# Patient Record
Sex: Female | Born: 1965
Health system: Southern US, Community
[De-identification: ages and names within clinical notes are randomized; demographics above are authoritative.]

## PROBLEM LIST (undated history)

## (undated) DIAGNOSIS — IMO0002 Reserved for concepts with insufficient information to code with codable children: Secondary | ICD-10-CM

## (undated) DIAGNOSIS — I429 Cardiomyopathy, unspecified: Secondary | ICD-10-CM

## (undated) DIAGNOSIS — M1990 Unspecified osteoarthritis, unspecified site: Secondary | ICD-10-CM

## (undated) DIAGNOSIS — I447 Left bundle-branch block, unspecified: Secondary | ICD-10-CM

## (undated) DIAGNOSIS — N920 Excessive and frequent menstruation with regular cycle: Secondary | ICD-10-CM

## (undated) DIAGNOSIS — R1900 Intra-abdominal and pelvic swelling, mass and lump, unspecified site: Secondary | ICD-10-CM

## (undated) DIAGNOSIS — G5600 Carpal tunnel syndrome, unspecified upper limb: Secondary | ICD-10-CM

## (undated) DIAGNOSIS — E785 Hyperlipidemia, unspecified: Secondary | ICD-10-CM

## (undated) DIAGNOSIS — M25512 Pain in left shoulder: Secondary | ICD-10-CM

## (undated) DIAGNOSIS — A609 Anogenital herpesviral infection, unspecified: Secondary | ICD-10-CM

## (undated) DIAGNOSIS — K8020 Calculus of gallbladder without cholecystitis without obstruction: Secondary | ICD-10-CM

## (undated) DIAGNOSIS — R079 Chest pain, unspecified: Secondary | ICD-10-CM

## (undated) DIAGNOSIS — I10 Essential (primary) hypertension: Secondary | ICD-10-CM

## (undated) DIAGNOSIS — E1165 Type 2 diabetes mellitus with hyperglycemia: Secondary | ICD-10-CM

## (undated) HISTORY — DX: Pain in left shoulder: M25.512

## (undated) HISTORY — DX: Carpal tunnel syndrome, unspecified upper limb: G56.00

## (undated) HISTORY — DX: Intra-abdominal and pelvic swelling, mass and lump, unspecified site: R19.00

## (undated) HISTORY — DX: Anogenital herpesviral infection, unspecified: A60.9

## (undated) HISTORY — DX: Reserved for concepts with insufficient information to code with codable children: IMO0002

## (undated) HISTORY — DX: Hyperlipidemia, unspecified: E78.5

## (undated) HISTORY — DX: Type 2 diabetes mellitus with hyperglycemia: E11.65

## (undated) HISTORY — DX: Essential (primary) hypertension: I10

## (undated) HISTORY — DX: Excessive and frequent menstruation with regular cycle: N92.0

## (undated) HISTORY — PX: TUBAL LIGATION: SHX77

---
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
## 1997-09-20 ENCOUNTER — Emergency Department (HOSPITAL_COMMUNITY): Admission: EM | Admit: 1997-09-20 | Discharge: 1997-09-20 | Payer: Self-pay | Admitting: Emergency Medicine

## 1998-07-28 ENCOUNTER — Emergency Department (HOSPITAL_COMMUNITY): Admission: EM | Admit: 1998-07-28 | Discharge: 1998-07-28 | Payer: Self-pay | Admitting: Emergency Medicine

## 1998-07-28 ENCOUNTER — Encounter: Payer: Self-pay | Admitting: Emergency Medicine

## 1998-08-10 ENCOUNTER — Emergency Department (HOSPITAL_COMMUNITY): Admission: EM | Admit: 1998-08-10 | Discharge: 1998-08-10 | Payer: Self-pay | Admitting: Emergency Medicine

## 1999-05-12 ENCOUNTER — Emergency Department (HOSPITAL_COMMUNITY): Admission: EM | Admit: 1999-05-12 | Discharge: 1999-05-12 | Payer: Self-pay | Admitting: Emergency Medicine

## 1999-08-25 ENCOUNTER — Emergency Department (HOSPITAL_COMMUNITY): Admission: EM | Admit: 1999-08-25 | Discharge: 1999-08-25 | Payer: Self-pay | Admitting: Emergency Medicine

## 1999-10-06 ENCOUNTER — Encounter: Admission: RE | Admit: 1999-10-06 | Discharge: 1999-10-06 | Payer: Self-pay | Admitting: Internal Medicine

## 1999-10-23 ENCOUNTER — Encounter: Admission: RE | Admit: 1999-10-23 | Discharge: 2000-01-21 | Payer: Self-pay | Admitting: Internal Medicine

## 1999-10-30 ENCOUNTER — Encounter: Admission: RE | Admit: 1999-10-30 | Discharge: 1999-10-30 | Payer: Self-pay | Admitting: Internal Medicine

## 1999-11-27 ENCOUNTER — Ambulatory Visit (HOSPITAL_COMMUNITY): Admission: RE | Admit: 1999-11-27 | Discharge: 1999-11-27 | Payer: Self-pay | Admitting: Internal Medicine

## 1999-11-27 ENCOUNTER — Encounter: Payer: Self-pay | Admitting: Internal Medicine

## 1999-11-27 ENCOUNTER — Encounter: Admission: RE | Admit: 1999-11-27 | Discharge: 1999-11-27 | Payer: Self-pay | Admitting: Internal Medicine

## 1999-12-04 ENCOUNTER — Encounter: Admission: RE | Admit: 1999-12-04 | Discharge: 1999-12-04 | Payer: Self-pay | Admitting: Hematology and Oncology

## 1999-12-11 ENCOUNTER — Encounter: Admission: RE | Admit: 1999-12-11 | Discharge: 1999-12-11 | Payer: Self-pay | Admitting: Internal Medicine

## 2000-01-22 ENCOUNTER — Encounter: Admission: RE | Admit: 2000-01-22 | Discharge: 2000-01-22 | Payer: Self-pay | Admitting: Internal Medicine

## 2000-02-19 ENCOUNTER — Encounter: Admission: RE | Admit: 2000-02-19 | Discharge: 2000-02-19 | Payer: Self-pay | Admitting: Internal Medicine

## 2000-03-20 ENCOUNTER — Encounter: Admission: RE | Admit: 2000-03-20 | Discharge: 2000-03-20 | Payer: Self-pay | Admitting: Internal Medicine

## 2000-05-13 ENCOUNTER — Encounter: Admission: RE | Admit: 2000-05-13 | Discharge: 2000-05-13 | Payer: Self-pay | Admitting: Internal Medicine

## 2000-07-21 ENCOUNTER — Encounter (INDEPENDENT_AMBULATORY_CARE_PROVIDER_SITE_OTHER): Payer: Self-pay | Admitting: *Deleted

## 2000-09-19 ENCOUNTER — Other Ambulatory Visit: Admission: RE | Admit: 2000-09-19 | Discharge: 2000-09-19 | Payer: Self-pay | Admitting: Obstetrics and Gynecology

## 2000-12-23 ENCOUNTER — Emergency Department (HOSPITAL_COMMUNITY): Admission: EM | Admit: 2000-12-23 | Discharge: 2000-12-23 | Payer: Self-pay

## 2000-12-24 ENCOUNTER — Encounter: Admission: RE | Admit: 2000-12-24 | Discharge: 2000-12-24 | Payer: Self-pay

## 2001-04-30 ENCOUNTER — Emergency Department (HOSPITAL_COMMUNITY): Admission: EM | Admit: 2001-04-30 | Discharge: 2001-04-30 | Payer: Self-pay | Admitting: Emergency Medicine

## 2001-04-30 ENCOUNTER — Encounter: Payer: Self-pay | Admitting: Emergency Medicine

## 2001-06-20 ENCOUNTER — Encounter: Admission: RE | Admit: 2001-06-20 | Discharge: 2001-06-20 | Payer: Self-pay | Admitting: Internal Medicine

## 2002-01-20 ENCOUNTER — Encounter: Admission: RE | Admit: 2002-01-20 | Discharge: 2002-01-20 | Payer: Self-pay | Admitting: Internal Medicine

## 2002-02-03 ENCOUNTER — Encounter: Admission: RE | Admit: 2002-02-03 | Discharge: 2002-02-03 | Payer: Self-pay | Admitting: Internal Medicine

## 2002-02-07 ENCOUNTER — Encounter: Payer: Self-pay | Admitting: Emergency Medicine

## 2002-02-07 ENCOUNTER — Emergency Department (HOSPITAL_COMMUNITY): Admission: EM | Admit: 2002-02-07 | Discharge: 2002-02-07 | Payer: Self-pay

## 2002-03-16 ENCOUNTER — Encounter: Admission: RE | Admit: 2002-03-16 | Discharge: 2002-03-16 | Payer: Self-pay | Admitting: Internal Medicine

## 2002-04-07 ENCOUNTER — Other Ambulatory Visit: Admission: RE | Admit: 2002-04-07 | Discharge: 2002-04-07 | Payer: Self-pay | Admitting: Obstetrics and Gynecology

## 2003-02-10 ENCOUNTER — Encounter: Admission: RE | Admit: 2003-02-10 | Discharge: 2003-02-10 | Payer: Self-pay | Admitting: Internal Medicine

## 2003-04-22 ENCOUNTER — Encounter: Admission: RE | Admit: 2003-04-22 | Discharge: 2003-04-22 | Payer: Self-pay | Admitting: Internal Medicine

## 2003-10-07 ENCOUNTER — Ambulatory Visit: Payer: Self-pay | Admitting: Internal Medicine

## 2003-12-07 ENCOUNTER — Ambulatory Visit: Payer: Self-pay | Admitting: Internal Medicine

## 2004-01-04 ENCOUNTER — Ambulatory Visit: Payer: Self-pay | Admitting: Internal Medicine

## 2004-03-14 ENCOUNTER — Ambulatory Visit: Payer: Self-pay | Admitting: Internal Medicine

## 2004-03-24 ENCOUNTER — Ambulatory Visit: Payer: Self-pay | Admitting: Internal Medicine

## 2004-03-27 ENCOUNTER — Encounter (INDEPENDENT_AMBULATORY_CARE_PROVIDER_SITE_OTHER): Payer: Self-pay | Admitting: *Deleted

## 2004-03-27 LAB — CONVERTED CEMR LAB: Pap Smear: NORMAL

## 2004-05-03 ENCOUNTER — Ambulatory Visit: Payer: Self-pay | Admitting: Internal Medicine

## 2004-06-02 ENCOUNTER — Ambulatory Visit: Payer: Self-pay | Admitting: Internal Medicine

## 2004-06-15 ENCOUNTER — Ambulatory Visit: Payer: Self-pay | Admitting: Internal Medicine

## 2004-07-28 ENCOUNTER — Ambulatory Visit: Payer: Self-pay | Admitting: Internal Medicine

## 2004-10-03 ENCOUNTER — Ambulatory Visit: Payer: Self-pay | Admitting: Internal Medicine

## 2004-10-12 ENCOUNTER — Ambulatory Visit: Payer: Self-pay | Admitting: Internal Medicine

## 2004-10-12 ENCOUNTER — Ambulatory Visit (HOSPITAL_COMMUNITY): Admission: RE | Admit: 2004-10-12 | Discharge: 2004-10-12 | Payer: Self-pay | Admitting: Internal Medicine

## 2005-01-19 ENCOUNTER — Emergency Department (HOSPITAL_COMMUNITY): Admission: EM | Admit: 2005-01-19 | Discharge: 2005-01-19 | Payer: Self-pay | Admitting: Family Medicine

## 2005-05-15 ENCOUNTER — Encounter (INDEPENDENT_AMBULATORY_CARE_PROVIDER_SITE_OTHER): Payer: Self-pay | Admitting: *Deleted

## 2005-05-15 ENCOUNTER — Ambulatory Visit: Payer: Self-pay | Admitting: Internal Medicine

## 2005-05-16 ENCOUNTER — Emergency Department (HOSPITAL_COMMUNITY): Admission: EM | Admit: 2005-05-16 | Discharge: 2005-05-16 | Payer: Self-pay | Admitting: Emergency Medicine

## 2005-06-13 ENCOUNTER — Ambulatory Visit: Payer: Self-pay | Admitting: Internal Medicine

## 2005-07-05 ENCOUNTER — Ambulatory Visit: Payer: Self-pay | Admitting: Internal Medicine

## 2005-11-02 ENCOUNTER — Ambulatory Visit: Payer: Self-pay | Admitting: Internal Medicine

## 2005-11-02 ENCOUNTER — Encounter (INDEPENDENT_AMBULATORY_CARE_PROVIDER_SITE_OTHER): Payer: Self-pay | Admitting: Pulmonary Disease

## 2005-11-02 ENCOUNTER — Ambulatory Visit (HOSPITAL_COMMUNITY): Admission: RE | Admit: 2005-11-02 | Discharge: 2005-11-02 | Payer: Self-pay | Admitting: Internal Medicine

## 2005-11-02 LAB — CONVERTED CEMR LAB
Basophils Absolute: 0 10*3/uL (ref 0.0–0.1)
Basophils percent auto: 0 % (ref 0–1)
Eosinophils Absolute: 0.2 cells/mcL (ref 0.0–0.7)
Eosinophils Relative: 2 % (ref 0–5)
HCT: 34.2 % — ABNORMAL LOW (ref 36.0–46.0)
Hemoglobin: 11.3 g/dL — ABNORMAL LOW (ref 12.0–15.0)
Leukocyte count, blood: 8.6 10*9/L (ref 4.0–10.5)
Lymphocytes Relative: 32 % (ref 12–46)
Lymphs Abs: 2.7 10*3/uL (ref 0.7–3.3)
MCHC: 33 g/dL (ref 30.0–36.0)
MCV: 94.5 fL (ref 78.0–100.0)
Monocytes Absolute: 0.8 10*3/uL — ABNORMAL HIGH (ref 0.2–0.7)
Monocytes Relative: 9 % (ref 3–11)
Neutro Abs: 4.9 10*3/uL (ref 1.7–7.7)
Neutrophils Relative %: 57 % (ref 43–77)
Platelets: 419 10*3/uL — ABNORMAL HIGH (ref 150–400)
RBC: 3.62 M/uL — ABNORMAL LOW (ref 3.87–5.11)
RDW: 12.6 % (ref 11.5–14.0)

## 2005-11-07 ENCOUNTER — Encounter (INDEPENDENT_AMBULATORY_CARE_PROVIDER_SITE_OTHER): Payer: Self-pay | Admitting: *Deleted

## 2005-11-07 DIAGNOSIS — J45909 Unspecified asthma, uncomplicated: Secondary | ICD-10-CM

## 2005-11-07 DIAGNOSIS — B009 Herpesviral infection, unspecified: Secondary | ICD-10-CM

## 2005-11-07 HISTORY — DX: Herpesviral infection, unspecified: B00.9

## 2005-11-07 HISTORY — DX: Unspecified asthma, uncomplicated: J45.909

## 2006-07-08 ENCOUNTER — Ambulatory Visit: Payer: Self-pay | Admitting: Internal Medicine

## 2006-07-08 ENCOUNTER — Telehealth (INDEPENDENT_AMBULATORY_CARE_PROVIDER_SITE_OTHER): Payer: Self-pay | Admitting: *Deleted

## 2006-07-08 ENCOUNTER — Encounter (INDEPENDENT_AMBULATORY_CARE_PROVIDER_SITE_OTHER): Payer: Self-pay | Admitting: Dermatology

## 2006-07-08 DIAGNOSIS — I10 Essential (primary) hypertension: Secondary | ICD-10-CM

## 2006-07-08 HISTORY — DX: Essential (primary) hypertension: I10

## 2006-07-08 LAB — CONVERTED CEMR LAB
BUN: 14 mg/dL (ref 6–23)
Blood Glucose, Fingerstick: 404
CO2: 25 meq/L (ref 19–32)
Calcium: 9.5 mg/dL (ref 8.4–10.5)
Chloride: 101 meq/L (ref 96–112)
Creatinine, Ser: 0.7 mg/dL (ref 0.40–1.20)
Glucose, Bld: 353 mg/dL — ABNORMAL HIGH (ref 70–99)
Hgb A1c MFr Bld: 11.1 %
Potassium: 4.2 meq/L (ref 3.5–5.3)
Sodium: 136 meq/L (ref 135–145)

## 2006-07-11 ENCOUNTER — Encounter (INDEPENDENT_AMBULATORY_CARE_PROVIDER_SITE_OTHER): Payer: Self-pay | Admitting: Dermatology

## 2006-07-11 ENCOUNTER — Ambulatory Visit: Payer: Self-pay | Admitting: Internal Medicine

## 2006-07-11 LAB — CONVERTED CEMR LAB: Blood Glucose, Fingerstick: 231

## 2006-08-06 ENCOUNTER — Emergency Department (HOSPITAL_COMMUNITY): Admission: EM | Admit: 2006-08-06 | Discharge: 2006-08-06 | Payer: Self-pay | Admitting: Emergency Medicine

## 2006-08-19 ENCOUNTER — Telehealth (INDEPENDENT_AMBULATORY_CARE_PROVIDER_SITE_OTHER): Payer: Self-pay | Admitting: *Deleted

## 2006-09-02 ENCOUNTER — Ambulatory Visit: Payer: Self-pay | Admitting: *Deleted

## 2006-09-02 DIAGNOSIS — G43009 Migraine without aura, not intractable, without status migrainosus: Secondary | ICD-10-CM | POA: Insufficient documentation

## 2006-09-02 HISTORY — DX: Migraine without aura, not intractable, without status migrainosus: G43.009

## 2006-09-10 ENCOUNTER — Emergency Department (HOSPITAL_COMMUNITY): Admission: EM | Admit: 2006-09-10 | Discharge: 2006-09-10 | Payer: Self-pay | Admitting: Emergency Medicine

## 2006-10-22 ENCOUNTER — Telehealth: Payer: Self-pay | Admitting: *Deleted

## 2006-11-01 ENCOUNTER — Emergency Department (HOSPITAL_COMMUNITY): Admission: EM | Admit: 2006-11-01 | Discharge: 2006-11-01 | Payer: Self-pay | Admitting: Emergency Medicine

## 2006-11-28 ENCOUNTER — Ambulatory Visit: Payer: Self-pay | Admitting: Internal Medicine

## 2006-11-28 LAB — CONVERTED CEMR LAB
Blood Glucose, Fingerstick: 205
Hgb A1c MFr Bld: 10 %

## 2007-01-23 HISTORY — PX: CHOLECYSTECTOMY: SHX55

## 2007-02-20 ENCOUNTER — Emergency Department (HOSPITAL_COMMUNITY): Admission: EM | Admit: 2007-02-20 | Discharge: 2007-02-20 | Payer: Self-pay | Admitting: Emergency Medicine

## 2007-04-14 ENCOUNTER — Telehealth (INDEPENDENT_AMBULATORY_CARE_PROVIDER_SITE_OTHER): Payer: Self-pay | Admitting: *Deleted

## 2007-05-16 ENCOUNTER — Encounter (INDEPENDENT_AMBULATORY_CARE_PROVIDER_SITE_OTHER): Payer: Self-pay | Admitting: Internal Medicine

## 2007-05-16 ENCOUNTER — Ambulatory Visit: Payer: Self-pay | Admitting: Infectious Disease

## 2007-05-16 ENCOUNTER — Encounter (INDEPENDENT_AMBULATORY_CARE_PROVIDER_SITE_OTHER): Payer: Self-pay | Admitting: *Deleted

## 2007-05-16 LAB — CONVERTED CEMR LAB
Blood Glucose, Fingerstick: 170
Hgb A1c MFr Bld: 10.8 %

## 2007-05-21 LAB — CONVERTED CEMR LAB
ALT: 17 units/L (ref 0–35)
AST: 16 units/L (ref 0–37)
Albumin: 4.3 g/dL (ref 3.5–5.2)
Alkaline Phosphatase: 106 units/L (ref 39–117)
BUN: 12 mg/dL (ref 6–23)
CO2: 24 meq/L (ref 19–32)
Calcium: 9.4 mg/dL (ref 8.4–10.5)
Chloride: 104 meq/L (ref 96–112)
Cholesterol: 215 mg/dL — ABNORMAL HIGH (ref 0–200)
Creatinine, Ser: 0.7 mg/dL (ref 0.40–1.20)
Creatinine, Urine: 283.2 mg/dL
Glucose, Bld: 154 mg/dL — ABNORMAL HIGH (ref 70–99)
HDL: 36 mg/dL — ABNORMAL LOW (ref >39)
LDL Cholesterol: 152 mg/dL — ABNORMAL HIGH (ref 0–99)
Microalb Creat Ratio: 4.9 mg/g (ref 0.0–30.0)
Microalb, Ur: 1.39 mg/dL (ref 0.00–1.89)
Potassium: 4.1 meq/L (ref 3.5–5.3)
Sodium: 140 meq/L (ref 135–145)
Total Bilirubin: 0.3 mg/dL (ref 0.3–1.2)
Total CHOL/HDL Ratio: 6
Total Protein: 7.5 g/dL (ref 6.0–8.3)
Triglycerides: 136 mg/dL (ref <150)
VLDL: 27 mg/dL (ref 0–40)

## 2007-05-22 ENCOUNTER — Ambulatory Visit: Payer: Self-pay | Admitting: Infectious Disease

## 2007-05-22 LAB — CONVERTED CEMR LAB: Blood Glucose, Home Monitor: 1 mg/dL

## 2007-06-24 ENCOUNTER — Emergency Department (HOSPITAL_COMMUNITY): Admission: EM | Admit: 2007-06-24 | Discharge: 2007-06-24 | Payer: Self-pay | Admitting: Emergency Medicine

## 2007-07-21 ENCOUNTER — Telehealth: Payer: Self-pay | Admitting: *Deleted

## 2007-08-20 ENCOUNTER — Ambulatory Visit: Payer: Self-pay | Admitting: Internal Medicine

## 2007-08-20 DIAGNOSIS — E785 Hyperlipidemia, unspecified: Secondary | ICD-10-CM | POA: Insufficient documentation

## 2007-08-20 HISTORY — DX: Hyperlipidemia, unspecified: E78.5

## 2007-08-20 LAB — CONVERTED CEMR LAB
Blood Glucose, Fingerstick: 280
Hgb A1c MFr Bld: 9.3 %

## 2007-10-27 ENCOUNTER — Ambulatory Visit: Payer: Self-pay | Admitting: Internal Medicine

## 2007-10-27 LAB — CONVERTED CEMR LAB
Blood Glucose, Fingerstick: 346
Hgb A1c MFr Bld: 11.8 %

## 2007-11-17 ENCOUNTER — Emergency Department (HOSPITAL_COMMUNITY): Admission: EM | Admit: 2007-11-17 | Discharge: 2007-11-17 | Payer: Self-pay | Admitting: Emergency Medicine

## 2007-11-20 ENCOUNTER — Ambulatory Visit: Payer: Self-pay | Admitting: Internal Medicine

## 2007-11-20 ENCOUNTER — Encounter (INDEPENDENT_AMBULATORY_CARE_PROVIDER_SITE_OTHER): Payer: Self-pay | Admitting: *Deleted

## 2007-11-20 LAB — CONVERTED CEMR LAB: Blood Glucose, Fingerstick: 217

## 2007-11-24 ENCOUNTER — Telehealth: Payer: Self-pay | Admitting: *Deleted

## 2007-11-24 ENCOUNTER — Ambulatory Visit: Payer: Self-pay | Admitting: *Deleted

## 2007-11-27 ENCOUNTER — Telehealth: Payer: Self-pay | Admitting: *Deleted

## 2007-12-01 ENCOUNTER — Telehealth: Payer: Self-pay | Admitting: *Deleted

## 2007-12-26 ENCOUNTER — Encounter (INDEPENDENT_AMBULATORY_CARE_PROVIDER_SITE_OTHER): Payer: Self-pay | Admitting: Internal Medicine

## 2007-12-26 LAB — HM DIABETES EYE EXAM

## 2008-01-02 ENCOUNTER — Encounter (INDEPENDENT_AMBULATORY_CARE_PROVIDER_SITE_OTHER): Payer: Self-pay | Admitting: *Deleted

## 2008-01-02 ENCOUNTER — Encounter (INDEPENDENT_AMBULATORY_CARE_PROVIDER_SITE_OTHER): Payer: Self-pay | Admitting: Internal Medicine

## 2008-01-02 ENCOUNTER — Ambulatory Visit: Payer: Self-pay | Admitting: Internal Medicine

## 2008-01-02 LAB — CONVERTED CEMR LAB
Blood Glucose, Fingerstick: 342
Hgb A1c MFr Bld: 9.8 %

## 2008-01-06 LAB — CONVERTED CEMR LAB
BUN: 14 mg/dL (ref 6–23)
CO2: 23 meq/L (ref 19–32)
Calcium: 9.2 mg/dL (ref 8.4–10.5)
Chloride: 101 meq/L (ref 96–112)
Cholesterol: 232 mg/dL — ABNORMAL HIGH (ref 0–200)
Creatinine, Ser: 0.76 mg/dL (ref 0.40–1.20)
Glucose, Bld: 365 mg/dL — ABNORMAL HIGH (ref 70–99)
HCT: 38.5 % (ref 36.0–46.0)
HDL: 32 mg/dL — ABNORMAL LOW (ref >39)
Hemoglobin: 12.4 g/dL (ref 12.0–15.0)
LDL Cholesterol: 158 mg/dL — ABNORMAL HIGH (ref 0–99)
MCHC: 32.2 g/dL (ref 30.0–36.0)
MCV: 94.4 fL (ref 78.0–100.0)
Platelets: 407 10*3/uL — ABNORMAL HIGH (ref 150–400)
Potassium: 4.4 meq/L (ref 3.5–5.3)
RBC: 4.08 M/uL (ref 3.87–5.11)
RDW: 12.8 % (ref 11.5–15.5)
Sodium: 135 meq/L (ref 135–145)
Total CHOL/HDL Ratio: 7.3
Triglycerides: 212 mg/dL — ABNORMAL HIGH (ref <150)
VLDL: 42 mg/dL — ABNORMAL HIGH (ref 0–40)
WBC: 8.3 10*3/uL (ref 4.0–10.5)

## 2008-01-07 ENCOUNTER — Encounter (INDEPENDENT_AMBULATORY_CARE_PROVIDER_SITE_OTHER): Payer: Self-pay | Admitting: Internal Medicine

## 2008-02-24 ENCOUNTER — Ambulatory Visit: Payer: Self-pay | Admitting: Internal Medicine

## 2008-03-04 ENCOUNTER — Ambulatory Visit: Payer: Self-pay | Admitting: Internal Medicine

## 2008-03-04 ENCOUNTER — Telehealth: Payer: Self-pay | Admitting: *Deleted

## 2008-03-04 LAB — CONVERTED CEMR LAB: Blood Glucose, Fingerstick: 193

## 2008-03-05 ENCOUNTER — Encounter (INDEPENDENT_AMBULATORY_CARE_PROVIDER_SITE_OTHER): Payer: Self-pay | Admitting: Internal Medicine

## 2008-03-08 ENCOUNTER — Telehealth (INDEPENDENT_AMBULATORY_CARE_PROVIDER_SITE_OTHER): Payer: Self-pay | Admitting: *Deleted

## 2008-03-12 ENCOUNTER — Encounter (INDEPENDENT_AMBULATORY_CARE_PROVIDER_SITE_OTHER): Payer: Self-pay | Admitting: Internal Medicine

## 2008-03-12 ENCOUNTER — Ambulatory Visit: Payer: Self-pay | Admitting: Internal Medicine

## 2008-03-12 LAB — CONVERTED CEMR LAB
Bilirubin Urine: NEGATIVE
Creatinine, Urine: 96.6 mg/dL
Hemoglobin, Urine: NEGATIVE
Ketones, ur: NEGATIVE mg/dL
Leukocytes, UA: NEGATIVE
Microalb Creat Ratio: 6.5 mg/g (ref 0.0–30.0)
Microalb, Ur: 0.63 mg/dL (ref 0.00–1.89)
Nitrite: NEGATIVE
Protein, ur: NEGATIVE mg/dL
Specific Gravity, Urine: 1.017 (ref 1.005–1.03)
Urine Glucose: NEGATIVE mg/dL
Urobilinogen, UA: 0.2 (ref 0.0–1.0)
pH: 6 (ref 5.0–8.0)

## 2008-03-30 ENCOUNTER — Ambulatory Visit: Payer: Self-pay | Admitting: Internal Medicine

## 2008-03-30 LAB — CONVERTED CEMR LAB
Blood Glucose, Fingerstick: 156
Hgb A1c MFr Bld: 8.8 %

## 2008-05-14 ENCOUNTER — Ambulatory Visit: Payer: Self-pay | Admitting: Internal Medicine

## 2008-05-14 LAB — CONVERTED CEMR LAB: Blood Glucose, Fingerstick: 153

## 2008-06-01 ENCOUNTER — Emergency Department (HOSPITAL_COMMUNITY): Admission: EM | Admit: 2008-06-01 | Discharge: 2008-06-01 | Payer: Self-pay | Admitting: Emergency Medicine

## 2008-10-21 ENCOUNTER — Telehealth (INDEPENDENT_AMBULATORY_CARE_PROVIDER_SITE_OTHER): Payer: Self-pay | Admitting: Internal Medicine

## 2009-02-15 ENCOUNTER — Telehealth: Payer: Self-pay | Admitting: *Deleted

## 2009-10-11 ENCOUNTER — Encounter: Payer: Self-pay | Admitting: Internal Medicine

## 2009-10-11 ENCOUNTER — Ambulatory Visit: Payer: Self-pay | Admitting: Internal Medicine

## 2009-10-11 LAB — CONVERTED CEMR LAB
Blood Glucose, Fingerstick: 462
Hgb A1c MFr Bld: 12.3 %

## 2009-10-11 LAB — HM DIABETES FOOT EXAM: HM Diabetic Foot Exam: NORMAL

## 2009-10-12 ENCOUNTER — Encounter: Payer: Self-pay | Admitting: Internal Medicine

## 2009-10-17 ENCOUNTER — Telehealth: Payer: Self-pay | Admitting: Internal Medicine

## 2009-10-17 LAB — CONVERTED CEMR LAB
ALT: 11 units/L (ref 0–35)
AST: 12 units/L (ref 0–37)
Albumin: 4.3 g/dL (ref 3.5–5.2)
Alkaline Phosphatase: 112 units/L (ref 39–117)
BUN: 13 mg/dL (ref 6–23)
Bacteria, UA: NONE SEEN
Basophils Absolute: 0 10*3/uL (ref 0.0–0.1)
Basophils Relative: 1 % (ref 0–1)
Bilirubin Urine: NEGATIVE
CO2: 23 meq/L (ref 19–32)
Calcium: 9 mg/dL (ref 8.4–10.5)
Casts: NONE SEEN /lpf
Chloride: 98 meq/L (ref 96–112)
Cholesterol: 235 mg/dL — ABNORMAL HIGH (ref 0–200)
Creatinine, Ser: 0.71 mg/dL (ref 0.40–1.20)
Creatinine, Urine: 102.5 mg/dL
Crystals: NONE SEEN
Eosinophils Absolute: 0.1 10*3/uL (ref 0.0–0.7)
Eosinophils Relative: 1 % (ref 0–5)
Glucose, Bld: 421 mg/dL — ABNORMAL HIGH (ref 70–99)
HCT: 36.7 % (ref 36.0–46.0)
HDL: 34 mg/dL — ABNORMAL LOW (ref >39)
Hemoglobin, Urine: NEGATIVE
Hemoglobin: 12.8 g/dL (ref 12.0–15.0)
Ketones, ur: NEGATIVE mg/dL
LDL Cholesterol: 146 mg/dL — ABNORMAL HIGH (ref 0–99)
Leukocytes, UA: NEGATIVE
Lymphocytes Relative: 28 % (ref 12–46)
Lymphs Abs: 2.4 10*3/uL (ref 0.7–4.0)
MCHC: 34.9 g/dL (ref 30.0–36.0)
MCV: 94.3 fL (ref >=78.0)
Microalb Creat Ratio: 4.9 mg/g (ref 0.0–30.0)
Microalb, Ur: 0.5 mg/dL (ref 0.00–1.89)
Monocytes Absolute: 0.7 10*3/uL (ref 0.1–1.0)
Monocytes Relative: 8 % (ref 3–12)
Neutro Abs: 5.3 10*3/uL (ref 1.7–7.7)
Neutrophils Relative %: 63 % (ref 43–77)
Nitrite: NEGATIVE
Platelets: 354 10*3/uL (ref 150–400)
Potassium: 4.4 meq/L (ref 3.5–5.3)
Protein, ur: NEGATIVE mg/dL
RBC: 3.89 M/uL (ref 3.87–5.11)
RDW: 12.2 % (ref 11.5–15.5)
Sodium: 132 meq/L — ABNORMAL LOW (ref 135–145)
Specific Gravity, Urine: 1.042 — ABNORMAL HIGH (ref 1.005–1.0)
Squamous Epithelial / LPF: NONE SEEN /lpf
Total Bilirubin: 0.3 mg/dL (ref 0.3–1.2)
Total CHOL/HDL Ratio: 6.9
Total Protein: 6.7 g/dL (ref 6.0–8.3)
Triglycerides: 276 mg/dL — ABNORMAL HIGH (ref <150)
Urine Glucose: >1000 mg/dL — AB
Urobilinogen, UA: 0.2 (ref 0.0–1.0)
VLDL: 55 mg/dL — ABNORMAL HIGH (ref 0–40)
WBC: 8.5 10*3/uL (ref 4.0–10.5)
pH: 6 (ref 5.0–8.0)

## 2009-10-19 ENCOUNTER — Telehealth: Payer: Self-pay | Admitting: *Deleted

## 2009-10-21 ENCOUNTER — Encounter: Payer: Self-pay | Admitting: Internal Medicine

## 2009-10-25 ENCOUNTER — Encounter: Payer: Self-pay | Admitting: Internal Medicine

## 2009-11-04 ENCOUNTER — Encounter: Payer: Self-pay | Admitting: Internal Medicine

## 2009-11-17 ENCOUNTER — Ambulatory Visit: Payer: Self-pay | Admitting: Internal Medicine

## 2009-11-17 DIAGNOSIS — E669 Obesity, unspecified: Secondary | ICD-10-CM

## 2009-11-17 DIAGNOSIS — E1169 Type 2 diabetes mellitus with other specified complication: Secondary | ICD-10-CM | POA: Insufficient documentation

## 2009-11-17 HISTORY — DX: Type 2 diabetes mellitus with other specified complication: E11.69

## 2009-11-17 HISTORY — DX: Type 2 diabetes mellitus with other specified complication: E66.9

## 2009-12-08 ENCOUNTER — Ambulatory Visit (HOSPITAL_COMMUNITY)
Admission: RE | Admit: 2009-12-08 | Discharge: 2009-12-08 | Payer: Self-pay | Source: Home / Self Care | Admitting: Family Medicine

## 2009-12-08 LAB — HM MAMMOGRAPHY

## 2010-01-17 ENCOUNTER — Telehealth: Payer: Self-pay | Admitting: Internal Medicine

## 2010-02-21 NOTE — Assessment & Plan Note (Signed)
Summary: F/U DM, HTN, HLD. New L arm pain. (PCP Kalia-Reynolds)   Vital Signs:  Patient profile:   45 year old female Height:      60.5 inches (153.67 cm) Weight:      200.4 pounds (91.09 kg) BMI:     38.63 Temp:     99.8 degrees F (37.67 degrees C) oral Pulse rate:   101 / minute BP sitting:   141 / 89  (left arm) Cuff size:   large  Vitals Entered By: Cynda Familia Duncan Dull) (November 17, 2009 2:57 PM) CC: F/U DIABETES, new L arm pain Is Patient Diabetic? Yes Did you bring your meter with you today? No Pain Assessment Patient in pain? no      Nutritional Status BMI of > 30 = obese  Have you ever been in a relationship where you felt threatened, hurt or afraid?No   Does patient need assistance? Functional Status Self care Ambulation Normal   Primary Care Provider:  [name] DO  CC:  F/U DIABETES and new L arm pain.  History of Present Illness: Pt is a 45 yo female with PMHx of DMII, HLD, HTN who presents to clinic today for for following:  1) DMII  - Follow-up. Taking Metformin regularly and tolerating without Side effects. Patient checking blood sugars almost daily in AM prior to breakfast and reports BG of 140s-220s mg/dL. Was unable to fill Glipizide 2/2 to delay in getting meds from [facility]. Exercising 45-60 minutes 4 days a week.  During last visit 09/11, pt  HgbA1c was 12.3 (09/11) from 8.8 (03/10). Pt recommended multiple times to start Insulin, but adamantly refuses all injectables. Wanted Prandin before, but says didn't realize it was a daily injection. Plans to control DM through diet and exercise. No abdominal pain, no nausea, no polyuria, no polyphagia, no blurry vision.  2) HTN - Follow-up. Does not check BP regularly. Was unable to fill Accupril (changed from Lisinopril on request from [facility]), 2/2 to delay in getting meds from [facility]. Not checking blood pressures regularly.No dizziness, lightheadedness, chest pain, syncope.   3) Left leg shin laceration -  Follow-up.  Healing at this time. No warmth, surrounding erythema, pus, drainage. No fevers, chills.   4) HLD - Follow-up. Unable to fill Lipitor (changed from Pravastatin at request of [facility]) 2/2 to delay in getting meds from [facility].  5) L arm pain - states left upper arm pain x 1 week, soreness of arms. No trauma, falls, injury. 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 No joint pain.      Preventive Screening-Counseling & Management  Alcohol-Tobacco     Alcohol drinks/day: 0     Smoking Status: never  Current Medications (verified): 1)  Glucophage 1000 Mg Tabs (Metformin Hcl) .... Take 1 Tablet By Mouth Two Times A Day  Allergies (verified): No Known Drug Allergies  Review of Systems       per HPI  Physical Exam  General:  Vital signs reviewed and noted. Well-developed,well-nourished,in no acute distress; alert,appropriate and cooperative throughout examination. Head: normocephalic, atraumatic. Neck: No deformities, masses, or tenderness noted. Lungs: Normal respiratory effort. Clear to auscultation BL without crackles or wheezes.  Heart: RRR. S1 and S2 normal without gallop, murmur, or rubs.  Abdomen: BS normoactive. Soft, Nondistended, non-tender.  No masses or organomegaly. Extremities: No pretibial edema. BL UE without joint crepitus, no obvious deformity, no tenderness to palpation, no redness, no swelling.  Impression & Recommendations:  Problem # 1:  DIABETES MELLITUS, TYPE II (ICD-250.00) Assessment Unchanged Poorly controlled, only on Metformin 1000mg  two times a day. Was unable to get Glipizide filled because waiting for [facility]. Still refuses insulin or any injectables. - Continue Metformin and Glipizide for now.  - Pt counselled extensively that HgA1c of 12.3 will likely not adequately respond to oral medications alone and that insulin will likely be required for DM control and to avoid all of the risks that poor  diabetic control will cause. However, still refuses insulin therapy.  - Pt insistent about using diet/exercise and current medications for DM control, despite >20 minute discussion. - Continue to monitor, will consider addition of Januvia or Janumet when pt gets insurance.  - Instructed to bring glucometer to next visit.  Problem # 2:  ESSENTIAL HYPERTENSION (ICD-401.9) Assessment: Unchanged Still not optimally controlled. But has not started her Accupril. - Pt instructed to get Accupril filled, she is to call if unable to get within the next week from [facility]. Will change back to Lisinopril for $4 [facility] med.  Her updated medication list for this problem includes:    Accupril 10 Mg Tabs (Quinapril hcl) .Marland Kitchen... Take 1 tablet by mouth once a day  Problem # 3:  HYPERLIPIDEMIA (ICD-272.4) Assessment: Unchanged Again, pt has not filled med yet.  - Rec  to call if unable to get within the next week from [facility]. Will change back to Pravastatin for $4 [facility] med.  Her updated medication list for this problem includes:    Lipitor 10 Mg Tabs (Atorvastatin calcium) .Marland Kitchen... Take 1/2  tablet by mouth once a night  Problem # 4:  Preventive Health Care (ICD-V70.0) Last FLP: 09/2009 - UTD - Tchol 235, Trig 276, HDL 34, LDL 146 --> pt had been off of all meds x 1 year, was started on Statin, waiting to get rx from [facility] Last HgA1c: 09/2009 - 12.3% --> plan recheck 12/2009 Last Microalbumin/Creatinine ratio: 09/2009 - 4.9 wnl --> due 09/2010 Last eye exam: 12/2007 - wnl --> order today Last foot exam: 09/2009 - wnl --> repeat 09/2010 Last CMP: 09/2009 - wnl --> recheck 1 month after starts statin and lisinopril Last PAP: 12/2007 - wnl --> refuses today, plan next visit Last mammogram: ordered last visit, not completed --> reschedule today  Problem # 5:  ARM PAIN, LEFT (ICD-729.5) Likely MSK, 2/2 to increased activity recently. - Will monitor for improvement. - Can use Ibuprofen for pain. - Will consider  imaging in future if paresthesias, weakness, or numbess occur  Complete Medication List: 1)  Glucophage 1000 Mg Tabs (Metformin hcl) .... Take 1 tablet by mouth two times a day 2)  Glipizide Xl 10 Mg Xr24h-tab (Glipizide) .... Take 1 tablet by mouth once a day 3)  Accupril 10 Mg Tabs (Quinapril hcl) .... Take 1 tablet by mouth once a day 4)  Lipitor 10 Mg Tabs (Atorvastatin calcium) .... Take 1/2  tablet by mouth once a night  Other Orders: Mammogram (Screening) (Mammo) Future Orders: Ophthalmology Referral (Ophthalmology) ... 01/22/2010 T-Comprehensive Metabolic Panel [phone]) ... 12/21/2009  Patient Instructions: 1)  Please follow-up at the clinic in 1 month, at which time we will reevaluate your diabetes, blood pressure, and cholesterol. 2)  Please call the clinic if you have trouble getting your medications from the Medication Assistance Program - within the next week. 3)  If you have worsening of your symptoms, develop Chest pain, difficulty breathing, shortness of breath, please call the clinic.  4)  Please get your mammogram completed as scheduled, we will discuss the results if they are abnormal. 5)  Please bring all of your medications and your glucometer in a bag to your next visit.    Orders Added: 1)  Est. Patient Level IV [16109] 2)  Mammogram (Screening) [Mammo] 3)  Ophthalmology Referral [Ophthalmology] 4)  T-Comprehensive Metabolic Panel [[id number]]   Process Orders Check Orders Results:     [facility]: ABN not required for this insurance Tests Sent for requisitioning (November 19, 2009 1:12 PM):     12/21/2009: [facility] -- T-Comprehensive Metabolic Panel [phone] (signed)     Prevention & Chronic Care Immunizations   Influenza vaccine: Not documented   Influenza vaccine deferral: Refused  (11/17/2009)    Tetanus booster: Not documented    Pneumococcal vaccine: Not documented  Other Screening   Pap smear:  NEGATIVE FOR INTRAEPITHELIAL LESIONS OR MALIGNANCY.  (01/02/2008)   Pap smear due: 01/2011    Mammogram: Not documented   Mammogram action/deferral: Ordered  (11/17/2009)   Smoking status: never  (11/17/2009)  Diabetes Mellitus   HgbA1C: 12.3  (10/11/2009)   Hemoglobin A1C due: 01/10/2010    Eye exam: Moderate non-proliferative diabetic retinopathy.     (12/26/2007)   Diabetic eye exam action/deferral: Ophthalmology referral  (11/17/2009)   Eye exam due: 11/17/2009    Foot exam: yes  (10/11/2009)   Foot exam action/deferral: Do today   High risk foot: No  (10/11/2009)   Foot care education: Done  (10/11/2009)   Foot exam due: 10/12/2010    Urine microalbumin/creatinine ratio: 4.9  (10/12/2009)   Urine microalbumin action/deferral: Ordered   Urine microalbumin/cr due: 10/13/2010    Diabetes flowsheet reviewed?: Yes   Progress toward A1C goal: Deteriorated  Lipids   Total Cholesterol: 235  (10/12/2009)   LDL: 146  (10/12/2009)   LDL Direct: Not documented   HDL: 34  (10/12/2009)   Triglycerides: 276  (10/12/2009)   Lipid panel due: 10/13/2010    SGOT (AST): 12  (10/12/2009)   BMP action: Ordered   SGPT (ALT): 11  (10/12/2009) CMP ordered    Alkaline phosphatase: 112  (10/12/2009)   Total bilirubin: 0.3  (10/12/2009)   Liver panel due: 12/21/2009    Lipid flowsheet reviewed?: Yes   Progress toward LDL goal: Unchanged  Hypertension   Last Blood Pressure: 141 / 89  (11/17/2009)   Serum creatinine: 0.71  (10/12/2009)   BMP action: Ordered   Serum potassium 4.4  (10/12/2009) CMP ordered     Hypertension flowsheet reviewed?: Yes   Progress toward BP goal: Unchanged  Self-Management Support :   Personal Goals (by the next clinic visit) :     Personal A1C goal: 7  (10/11/2009)     Personal blood pressure goal: 130/80  (10/11/2009)     Personal LDL goal: 100  (10/11/2009)    Patient will work on the following items until the next clinic visit to reach self-care  goals:     Medications and monitoring: take my medicines every day  (11/17/2009)     Eating: eat foods that are low in salt, eat baked foods instead of fried foods  (11/17/2009)     Activity: take a 30 minute walk every day  (10/11/2009)    Diabetes self-management support: Written self-care plan  (11/17/2009)   Diabetes care plan printed   Last medical nutrition therapy: 05/22/2007    Hypertension self-management support: Written self-care plan  (11/17/2009)   Hypertension self-care  plan printed.    Lipid self-management support: Written self-care plan  (11/17/2009)   Lipid self-care plan printed.   Nursing Instructions: Schedule screening mammogram (see order) Refer for screening diabetic eye exam (see order)    Appended Document: F/U DM, HTN, HLD. New L arm pain. (PCP Saralyn Pilar) I discussed Ms Sadlowski with Dr Saralyn Pilar and have reviewed her written note. I agree with her assessment and plan as outlined above.

## 2010-02-21 NOTE — Progress Notes (Signed)
  Phone Note Outgoing Call   Call placed by: Theotis Barrio NT II,  October 19, 2009 11:32 AM Call placed to: Patient Details for Reason: NEED TO KNOW WHAT PHARM.SHE USES Summary of Call: CALLED AND LEFT VOICE MESSAGE FOR PATIENT TO CALL [facility], NEED TO KNOW WHAT PHARMACY SHE USES , SO HER RX CAN BE SENT.

## 2010-02-21 NOTE — Progress Notes (Signed)
Summary: phone/gg  Phone Note Call from Patient   Caller: Patient Summary of Call: Pt called and she still has a temp of 99.6, she wants to go back to work and called to see if that's okay. 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 This was confirmed with Dr Noel Gerold Initial call taken by: Merrie Roof RN,  November 27, 2007 3:38 PM

## 2010-02-21 NOTE — Assessment & Plan Note (Signed)
Summary: est-ck/fu/meds/cfb   Vital Signs:  Patient profile:   45 year old female Height:      60.5 inches (153.67 cm) Weight:      211.3 pounds (96.05 kg) Pulse rate:   115 / minute BP sitting:   150 / 90  (left arm) Cuff size:   large  Vitals Entered By: Krystal Eaton Duncan Dull) (March 30, 2008 3:30 PM) Is Patient Diabetic? Yes  Pain Assessment Patient in pain? no      Nutritional Status BMI of > 30 = obese CBG Result 156  Have you ever been in a relationship where you felt threatened, hurt or afraid?No   Does patient need assistance? Functional Status Self care Ambulation Normal   Primary Care Provider:  Manning Charity MD   History of Present Illness: This is a 45 year old woman with past medical history of DM and HTN.  She has no complaints today.  She is working hard to control her weight and DM.  She has been working out 30 minutes 5xwk, has cut out most sweets, salt and snacking.  She is eating 3 meals plus glucerna shakes when she needs them.       Preventive Screening-Counseling & Management     Alcohol drinks/day: 0     Smoking Status: never     Diet Comments: cutting out sweets and salty foods     Diet Counseling: provided support     Nutrition Referrals: no     Does Patient Exercise: yes     Type of exercise: gym classes  Current Medications (verified): 1)  Glipizide 10 Mg Tabs (Glipizide) .... Take 1 Tablet By Mouth Twice A Day 2)  Glucophage 1000 Mg Tabs (Metformin Hcl) .... Take 1 Tablet By Mouth Two Times A Day 3)  Lisinopril 10 Mg  Tabs (Lisinopril) .... Take One Tablet By Mouth Daily. 4)  Aspirin 81 Mg Chew (Aspirin) .... Take 1 Tablet By Mouth Once A Day 5)  Imipramine Hcl 50 Mg  Tabs (Imipramine Hcl) .... Take 1 Tablet By Mouth At Bedtime 6)  Pravachol 40 Mg Tabs (Pravastatin Sodium) .... Take Two Tablets Daily. 7)  [employer] Ultima Test   Strp (Glucose Blood) .... To Test Blood Glucose Twice Daily 8)  Blood Glucose Meter   Kit (Blood Glucose  Monitoring Suppl) .... [facility] Relion Ultima Meter To Go With Test Strips 9)  Lancets   Misc (Lancets) .... To Test Blood Glucose Twice Daily  Allergies (verified): No Known Drug Allergies  Social History:    Does Patient Exercise:  yes  Review of Systems       all systems reviewed and negative.  Physical Exam  General:  alert and overweight-appearing.   Lungs:  normal respiratory effort.   Heart:  normal rate, regular rhythm, no murmur, and no gallop.   Abdomen:  soft, non-tender, and normal bowel sounds.   Pulses:  +2 Extremities:  no edema Neurologic:  alert & oriented X3, cranial nerves II-XII intact, strength normal in all extremities, and gait normal.   Psych:  Oriented X3, memory intact for recent and remote, and normally interactive.     Impression & Recommendations:  Problem # 1:  DIABETES MELLITUS, TYPE II (ICD-250.00) A1C is 8.8 from 9.8.  She is very motivated to control her disease with diet and exercise, and she is doing well.  I encouraged her to keep it up.  She is on max glucophage, glypizide and on an ACE.  She has been  to an eye doc in 12/09.  Her updated medication list for this problem includes:    Glipizide 10 Mg Tabs (Glipizide) .Marland Kitchen... Take 1 tablet by mouth twice a day    Glucophage 1000 Mg Tabs (Metformin hcl) .Marland Kitchen... Take 1 tablet by mouth two times a day    Lisinopril 10 Mg Tabs (Lisinopril) .Marland Kitchen... Take one tablet by mouth daily.    Aspirin 81 Mg Chew (Aspirin) .Marland Kitchen... Take 1 tablet by mouth once a day  Orders: T-Hgb A1C (in-house) ([id number]) T- Capillary Blood Glucose (41660)  Problem # 2:  ESSENTIAL HYPERTENSION (ICD-401.9) Still hypertensive.  She is medication adverse.  Will add HCTZ.  She is exercising and if she looses weight her BP will be better controled.    Her updated medication list for this problem includes:    Lisinopril 10 Mg Tabs (Lisinopril) .Marland Kitchen... Take one tablet by mouth daily.    Hydrochlorothiazide 25 Mg Tabs (Hydrochlorothiazide)  .Marland Kitchen... Take one tablet daily for blood pressure.  BP today: 150/90 Prior BP: 150/89 (03/04/2008)  Labs Reviewed: Creat: 0.76 (01/02/2008) Chol: 232 (01/02/2008)   HDL: 32 (01/02/2008)   LDL: 158 (01/02/2008)   TG: 212 (01/02/2008)  Problem # 3:  ASTHMA (ICD-493.90) no recent problems with wheesing or cough.  Problem # 4:  HYPERLIPIDEMIA (ICD-272.4)  Lipids high at last check 3 months ago.  She has not been taking lipitor because she can not afford it.  Will start max dose pravachol 40mg  x 2 daily, and recheck lipids when she comes back to clinic.  Also check CMET at that time.   Her updated medication list for this problem includes:    Pravachol 40 Mg Tabs (Pravastatin sodium) .Marland Kitchen... Take two tablets daily.  Labs Reviewed: Chol: 232 (01/02/2008)   HDL: 32 (01/02/2008)   LDL: 158 (01/02/2008)   TG: 212 (01/02/2008) SGOT: 16 (05/16/2007)   SGPT: 17 (05/16/2007)  Future Orders: T-Comprehensive Metabolic Panel ([id number]) ... 07/01/2008 T-Lipid Profile [id number]) ... 07/01/2008  Problem # 5:  MORBID OBESITY (ICD-278.01) Weight has not decreased, but she is making appropriate lifestyle changes.  I encouraged her to continue what she is doing.  Will monitor.  Complete Medication List: 1)  Glipizide 10 Mg Tabs (Glipizide) .... Take 1 tablet by mouth twice a day 2)  Glucophage 1000 Mg Tabs (Metformin hcl) .... Take 1 tablet by mouth two times a day 3)  Lisinopril 10 Mg Tabs (Lisinopril) .... Take one tablet by mouth daily. 4)  Aspirin 81 Mg Chew (Aspirin) .... Take 1 tablet by mouth once a day 5)  Imipramine Hcl 50 Mg Tabs (Imipramine hcl) .... Take 1 tablet by mouth at bedtime 6)  Pravachol 40 Mg Tabs (Pravastatin sodium) .... Take two tablets daily. 7)  Relion Ultima Test Strp (Glucose blood) .... To test blood glucose twice daily 8)  Blood Glucose Meter Kit (Blood glucose monitoring suppl) .... [facility] relion ultima meter to go with test strips 9)  Lancets Misc (Lancets) .... To  test blood glucose twice daily 10)  Hydrochlorothiazide 25 Mg Tabs (Hydrochlorothiazide) .... Take one tablet daily for blood pressure.  Patient Instructions: 1)  Please schedule a follow-up appointment in 3 months. 2)  Congratulations and keep working out and eating well! 3)  The medication list was reviewed and reconciled.  All changed / newly prescribed medications were explained.  A complete medication list was provided to the patient / caregiver.  Prescriptions: HYDROCHLOROTHIAZIDE 25 MG TABS (HYDROCHLOROTHIAZIDE) Take one tablet daily for blood pressure.  #  32 x 6   Entered and Authorized by:   Elby Showers MD   Signed by:   Elby Showers MD on 04/01/2008   Method used:   Electronically to        [facility] (retail)       [address]       [place], [place]  [address]       Ph: [phone]       Fax: [phone]   RxID:   [id number] PRAVACHOL 40 MG TABS (PRAVASTATIN SODIUM) Take two tablets daily.  #64 x 6   Entered and Authorized by:   Elby Showers MD   Signed by:   Elby Showers MD on 03/30/2008   Method used:   Electronically to        [facility] (retail)       [address]       [place], [place]  [address]       Ph: [phone]       Fax: [phone]   RxID:   [id number]   Laboratory Results   Blood Tests   Date/Time Received: March 30, 2008 3:39 PM  Date/Time Reported: Oren Beckmann  March 30, 2008 3:39 PM   HGBA1C: 8.8%   (Normal Range: Non-Diabetic - 3-6%   Control Diabetic - 6-8%) CBG Random:: 156mg /dL      Appended Document: est-ck/fu/meds/cfb    Clinical Lists Changes  Orders: Added new Service order of Est. Patient Level III (24268) - Signed

## 2010-02-21 NOTE — Medication Information (Signed)
Summary: MEDICATION ASSISTANCE PROGRAM  MEDICATION ASSISTANCE PROGRAM   Imported By: Margie Billet 11/07/2009 14:08:26  _____________________________________________________________________  External Attachment:    Type:   Image     Comment:   External Document

## 2010-02-21 NOTE — Progress Notes (Signed)
Summary: phone/gg  Phone Note Call from Patient   Caller: Patient Summary of Call: Pt called with c/o body aches, chills, and cough , feels bad  X 5 days.  She is not sure about fever. Taking cough meds, advil. She would like to be seen. We have no appointments so pt will go to [facility] today. Initial call taken by: Merrie Roof RN,  February 15, 2009 3:10 PM

## 2010-02-21 NOTE — Progress Notes (Signed)
Summary: CALL, APPT/ [facility]  Phone Note Call from Patient   Reason for Call: Acute Illness Summary of Call: pt calls, states she has a cbg >400 this am, after speaking w/ pt she states she is nauseated, tired, weak and has not had her meds in 3 days, per dr Lyda Perone she is to be seen this pm, 1330- dr Onalee Hua Initial call taken by: Marin Roberts RN,  March 04, 2008 12:01 PM

## 2010-02-21 NOTE — Miscellaneous (Signed)
Summary: Rx for Lisinopril, Pravastatin - future order for CMET  Clinical Lists Changes  Problems: Assessed ESSENTIAL HYPERTENSION as comment only -  - Pt will need have CMET checked in 1 month to evaluate kidney function/ electrolytes.   Her updated medication list for this problem includes:    Lisinopril 10 Mg Tabs (Lisinopril) .Marland Kitchen... Take 1 tablet by mouth once a day  Future Orders: T-CMP with Estimated GFR ([id number]) ... 11/21/2009  Assessed HYPERLIPIDEMIA as comment only -  - Pt will need CMET in 1 month to evaluate LFTs.  Her updated medication list for this problem includes:    Pravastatin Sodium 20 Mg Tabs (Pravastatin sodium) .Marland Kitchen... Take 1 tablet by mouth once a night  Future Orders: T-CMP with Estimated GFR ([id number]) ... 11/21/2009  Medications: Added new medication of LISINOPRIL 10 MG TABS (LISINOPRIL) Take 1 tablet by mouth once a day - Signed Added new medication of PRAVASTATIN SODIUM 20 MG TABS (PRAVASTATIN SODIUM) Take 1 tablet by mouth once a night - Signed Rx of LISINOPRIL 10 MG TABS (LISINOPRIL) Take 1 tablet by mouth once a day;  #30 x 2;  Signed;  Entered by: [name] DO;  Authorized by: [name] DO;  Method used: Printed then faxed to [facility], [address] [place], [place], [place]  [address], Ph: [phone], Fax: [phone] Rx of PRAVASTATIN SODIUM 20 MG TABS (PRAVASTATIN SODIUM) Take 1 tablet by mouth once a night;  #30 x 2;  Signed;  Entered by: [name] DO;  Authorized by: [name] DO;  Method used: Printed then faxed to [facility], [address] [place], [place], [place]  [address], Ph: [phone], Fax: [phone] Orders: Added new Test order of T-CMP with Estimated GFR ([id number]) - Signed   Impression & Recommendations:  Problem # 1:  ESSENTIAL HYPERTENSION (ICD-401.9)  - Pt will need have CMET checked in 1 month to evaluate kidney function/  electrolytes.   Her updated medication list for this problem includes:    Lisinopril 10 Mg Tabs (Lisinopril) .Marland Kitchen... Take 1 tablet by mouth once a day  Future Orders: T-CMP with Estimated GFR ([id number]) ... 11/21/2009  Problem # 2:  HYPERLIPIDEMIA (ICD-272.4)  - Pt will need CMET in 1 month to evaluate LFTs.  Her updated medication list for this problem includes:    Pravastatin Sodium 20 Mg Tabs (Pravastatin sodium) .Marland Kitchen... Take 1 tablet by mouth once a night  Future Orders: T-CMP with Estimated GFR ([id number]) ... 11/21/2009  Complete Medication List: 1)  Glucophage 1000 Mg Tabs (Metformin hcl) .... Take 1 tablet by mouth two times a day 2)  Glipizide Xl 10 Mg Xr24h-tab (Glipizide) .... Take 1 tablet by mouth once a day 3)  Lisinopril 10 Mg Tabs (Lisinopril) .... Take 1 tablet by mouth once a day 4)  Pravastatin Sodium 20 Mg Tabs (Pravastatin sodium) .... Take 1 tablet by mouth once a night    Prescriptions: PRAVASTATIN SODIUM 20 MG TABS (PRAVASTATIN SODIUM) Take 1 tablet by mouth once a night  #30 x 2   Entered and Authorized by:   [name] DO   Signed by:   [name] DO on 10/21/2009   Method used:   Printed then faxed to ...       [facility] (retail)       [address] [place], [place]  [address]       Ph: [phone]  Fax: [phone]   RxID:   [id number] LISINOPRIL 10 MG TABS (LISINOPRIL) Take 1 tablet by mouth once a day  #30 x 2   Entered and Authorized by:   [name] DO   Signed by:   [name] DO on 10/21/2009   Method used:   Printed then faxed to ...       [facility] (retail)       [address] [place], [place]  [address]       Ph: [phone]       Fax: [phone]   RxID:   [id number]   Process Orders Check Orders Results:     [facility]: ABN not required for this insurance Order queued for requisitioning  for [facility]: October 22, 2009 8:26 AM  Tests Sent for requisitioning (October 22, 2009 8:26 AM):     11/21/2009: [facility] -- T-CMP with Estimated GFR [[id number]] (signed)

## 2010-02-21 NOTE — Letter (Signed)
Summary: STAFF MEDICAL REPORT  STAFF MEDICAL REPORT   Imported By: Margie Billet 11/04/2009 10:19:00  _____________________________________________________________________  External Attachment:    Type:   Image     Comment:   External Document

## 2010-02-21 NOTE — Consult Note (Signed)
Summary: [facility]   Imported By: Florinda Marker 01/05/2008 13:59:50  _____________________________________________________________________  External Attachment:    Type:   Image     Comment:   External Document

## 2010-02-21 NOTE — Consult Note (Signed)
Summary: [facility]: Student Medical Form  [facility]: Student Medical Form   Imported By: Florinda Marker 03/15/2008 13:55:06  _____________________________________________________________________  External Attachment:    Type:   Image     Comment:   External Document

## 2010-02-21 NOTE — Progress Notes (Signed)
Summary: Call to pt RE: CMET, FLP,UA results  Phone Note Outgoing Call   Summary of Call: Sandra [name] was called regarding her recent labwork, and was explained the following: 1) Diabetes - her UA shows she is spilling a lot of glucose (>1000), in addition to random blood glucose of 421 mg/dL - therefore, in the setting of her HgA1c of 12, she needs to start insulin therapy. Sandra [name] understands this, however, she refuses to start insulin at this time. States she will take any oral medications, and even the injectables such as Victoza or Byetta, but will not use insulin. Sandra [name] was explained that regardless of these interventions, her DM will most likely not be controlled without insulin. She expressed understanding of this, however, continues to refuse. I explained that I will need to speak with my attending, Dr.Golding, about this matter, and will readdress this matter at her f/u appt this week. 2) HLD - Sandra [name] was informed that her cholesterol level is still too high for a diabetic patient. Therefore, I need to restart her cholesterol medication. She elects to start it when she follows up with the clinic this week, as she is uncertain what pharmacy to use and is trying to apply for MAP. 3) HTN - Sandra [name] was informed that her chemistry panel (except glucose) is otherwise normal, so it is safe to restart her blood pressure medications that she has been off of for 1 year. She elects to start it when she follows up with the clinic this week, as she is uncertain what pharmacy to use and is trying to apply for MAP. I will flag Chilon and as for f/u appt this week, who is asked to call Sandra [name] with an appt. It is imperative that Sandra [name] have close follow-up of her Diabetes, which she states she undestands and will do so. [name], D.O. 10/17/09, [id number]     Appended Document: Call to pt RE: CMET, FLP,UA results Patient called back and stated she will keep her appointment with  you on 11/17/2009.  She does not have another to come in due to her new job.  She also stated that she uses the MAP program at the health Department to get her medications filled.

## 2010-02-21 NOTE — Assessment & Plan Note (Signed)
Summary: productive cough/gg   Vital Signs:  Patient Profile:   45 Years Old Female Height:     60.5 inches (153.67 cm) Weight:      209.6 pounds (95.27 kg) BMI:     40.41 O2 Sat:      99 % Temp:     99.1 degrees F (37.28 degrees C) oral Pulse rate:   109 / minute BP sitting:   134 / 84  (right arm)  Pt. in pain?   yes    Location:   chest    Intensity:   4-5  Vitals Entered By: Stanton Kidney Ditzler RN (November 28, 2006 2:15 PM) Oxygen therapy Room Air              Is Patient Diabetic? Yes  Nutritional Status BMI of > 30 = obese Nutritional Status Detail ok CBG Result 205  Have you ever been in a relationship where you felt threatened, hurt or afraid?denies   Does patient need assistance? Functional Status Self care Ambulation Normal     Chief Complaint:  c/o of nonproductive  cough x 2 days. H/A.Marland Kitchen  History of Present Illness: Sandra [name] is a 45 yo woman with DM, HTN and asthma presenting to the opc with c/o non productive cough x 48 h.  Was feeling bad x 5-6 days but started having coughing spells 48 h ago. Dry cough. Chest hurting in upper ribcage when she coughs (especially at night). Endorses a frontal headache. No otalgia or ear pruritus. Sore throat; has been using Chloraseptic with some relief. Mild odynophagia. C/o being cold but hasn't checked her temperature.   Using night time cold medication but nothing during the day. Using Halls cough drops a lot. 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 Pt didn't get a flu shot. Sore and achy muscles. Feels like she could sleep for days.  Felt a bit short of breath at work today but denies wheezing.  Has a test at school tonight ([employer]; studying at [employer] in Early Childhood Education).  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 Ill-appearing, wearing her coat on top of herself and trying to sleep. Head:     atraumatic and no abnormalities observed.   Eyes:     vision grossly intact, pupils equal, pupils round, and pupils reactive to light.   Nose:     no external erythema and no nasal discharge.   Mouth:     Good dentition, pharynx pink and moist, no exudates, and pharyngeal erythema.   Neck:     supple, full ROM, no masses, no thyromegaly, and no cervical lymphadenopathy.  Chest Wall:     no tenderness.   Lungs:     normal respiratory effort, no accessory muscle use, normal breath sounds, no crackles, and no wheezes.   Heart:     normal rate, regular rhythm, no murmur, and no gallop.   Abdomen:     soft, non-tender, normal bowel sounds, no distention, no masses, and no guarding.   Pulses:     2+ peripheral pulses. Extremities:     trace left pedal edema and trace right pedal edema.   Neurologic:     alert & oriented X3, cranial nerves II-XII intact, strength normal in all extremities, and gait normal.      Impression & Recommendations:  Problem # 1:  URI (ICD-465.9) Upper resp. sx x 4 days with bothersome cough. No fevers or other signs/sx suspicious for a bacterial infx. Will treat symptomatically for now and inform pt of sx which should prompt her return for a reevaluation.  See instruction section.  Her updated medication list for this problem includes:    Aspirin 81 Mg Chew (Aspirin) .Marland Kitchen... Take 1 tablet by mouth once a day    Tessalon Perles 100 Mg Caps (Benzonatate)  .Marland Kitchen... Take one tab by mouth three times a day as needed for cough.   Problem # 2:  DIABETES MELLITUS, TYPE II (ICD-250.00) Sandra [name] is aware that her diabetes is not well controlled and she agreed to make an apptmt with Dr. Lyda Perone her PCP so that her mgmt can be addressed.  Her updated medication list for this problem includes:    Glipizide 10 Mg Tabs (Glipizide) .Marland Kitchen... Take 1 tablet by mouth twice a day    Glucophage 1000 Mg Tabs (Metformin hcl) .Marland Kitchen... Take 1 tablet by mouth two times a day    Lisinopril 20 Mg Tabs (Lisinopril) .Marland Kitchen... Take 1 tablet by mouth once a day    Aspirin 81 Mg Chew (Aspirin) .Marland Kitchen... Take 1 tablet by mouth once a day  Orders: T- Capillary Blood Glucose (82948) T-Hgb A1C (in-house) ([id number])   Problem # 3:  ESSENTIAL HYPERTENSION (ICD-401.9) Optimal control. Advised her to pick URI OTC meds "for hypertensives".  Her updated medication list for this problem includes:    Lisinopril 20 Mg Tabs (Lisinopril) .Marland Kitchen... Take 1 tablet by mouth once a day  BP today: 134/84 Prior BP: 125/79 (09/02/2006)  Labs Reviewed: Creat: 0.70 (07/08/2006)   Complete Medication List: 1)  Glipizide 10 Mg Tabs (Glipizide) .... Take 1 tablet by mouth twice a day 2)  Glucophage 1000 Mg Tabs (Metformin hcl) .... Take 1 tablet by mouth two times a day 3)  Albuterol 90 Mcg/act Aers (Albuterol) .... As needed 4)  Lisinopril 20 Mg Tabs (Lisinopril) .... Take 1 tablet by mouth once a day 5)  Aspirin 81 Mg Chew (Aspirin) .... Take 1 tablet by mouth once a day 6)  Patanol 0.1 % Soln (Olopatadine hcl) .Marland Kitchen.. 1 drop each eye every 6-8 hours, up to twice a day 7)  Imipramine Hcl 50 Mg Tabs (Imipramine hcl) .... Take 1 tablet by mouth at bedtime 8)  Tessalon Perles 100 Mg Caps (Benzonatate) .... Take one tab by mouth three times a day as needed for cough.   Patient Instructions: 1)  Get plenty of rest, drink lots of clear liquids, and use tylenol or Ibuprophen for fever and comfort. Return in 7-10  days if you're not better:sooner if you're feeliong worse. 2)  Acute sinusitis symptoms for less than 10 days are not helped  by antibiotics.Use over the counter decongestants and cough syrup (only as directed). Call if no improvement in 5-7 days, sooner if increasing pain or fever. 3)  Avoid using cough drops since they may make your cough worse. 4)  Make an appointment with Dr. Lyda Perone whenever she is next available to discuss your diabetes control.    Prescriptions: TESSALON PERLES 100 MG  CAPS (BENZONATATE) Take one tab by mouth three times a day as needed for cough.  #21 x 0   Entered and Authorized by:   Olene Craven MD   Signed by:   Olene Craven MD on 11/28/2006   Method used:   Electronically sent to ...       [facility]  [address]       [address]       [place], [place]  [address]       Ph: [phone]       Fax: [phone]   RxID:   [id number]  ]  Vital Signs:  Patient Profile:   45 Years Old Female Height:     60.5 inches (153.67 cm) Weight:      209.6 pounds (95.27 kg) BMI:     40.41 O2 Sat:      99 % Temp:     99.1 degrees F (37.28 degrees C) oral Pulse rate:   109 / minute BP sitting:   134 / 84    Location:   chest    Intensity:   4-5             CBG Result 205 Last PAP Result Normal PapHx  Normal (03/27/2004 11:26:39 AM)      Laboratory Results   Blood Tests   Date/Time Recieved: November 28, 2006 2:26 PM Date/Time Reported: ..................................................................Marland KitchenAlric Quan  November 28, 2006 2:26 PM  HGBA1C: 10.0%   (Normal Range: Non-Diabetic - 3-6%   Control Diabetic - 6-8%) CBG Random: 205

## 2010-02-21 NOTE — Letter (Signed)
Summary: Handout Printed  Printed Handout:  - *Patient Instructions 

## 2010-02-21 NOTE — Assessment & Plan Note (Signed)
Summary: high cbg, nausea, tired,weak, no meds x 3 days,appt per dr Antony Odea...   Vital Signs:  Patient Profile:   45 Years Old Female Height:     60.5 inches (153.67 cm) Weight:      210.7 pounds (95.77 kg) BMI:     40.62 Temp:     97.7 degrees F (36.50 degrees C) oral Pulse rate:   106 / minute BP sitting:   150 / 89  (left arm)  Pt. in pain?   no  Vitals Entered By: Chinita Pester RN (March 04, 2008 1:50 PM)              Is Patient Diabetic? Yes Did you bring your meter with you today? Yes Nutritional Status BMI of > 30 = obese CBG Result 193  Have you ever been in a relationship where you felt threatened, hurt or afraid?No   Does patient need assistance? Functional Status Self care Ambulation Normal     PCP:  Manning Charity MD  Chief Complaint:  Elevated blood sugars - 331; has not been taking her med.c/o being tired/nausea.  History of Present Illness: Sandra [name] is a 45 yo woman with PMH as outlined in chart.  She is here today because her sugar was around 450 last night then down to 300s.  However, she has not taken her medications in few weeks, less than 1 month.  She says she doesn't have any excuse, just has forgotten.  Has been feeling tired and week for the last few days.  She has also had some polyruia and nausea but no vomiting, lightheadedness, dysuria, and no abdominal pain.     Prior Medications Reviewed Using: Patient Recall  Updated Prior Medication List: GLIPIZIDE 10 MG TABS (GLIPIZIDE) Take 1 tablet by mouth twice a day GLUCOPHAGE 1000 MG TABS (METFORMIN HCL) Take 1 tablet by mouth two times a day LISINOPRIL 10 MG  TABS (LISINOPRIL) Take one tablet by mouth daily. ASPIRIN 81 MG CHEW (ASPIRIN) Take 1 tablet by mouth once a day IMIPRAMINE HCL 50 MG  TABS (IMIPRAMINE HCL) Take 1 tablet by mouth at bedtime LIPITOR 40 MG TABS (ATORVASTATIN CALCIUM) Take one tablet daily for cholesterol. RELION ULTIMA TEST   STRP (GLUCOSE BLOOD) to test blood glucose twice  daily [BMN] BLOOD GLUCOSE METER   KIT (BLOOD GLUCOSE MONITORING SUPPL) [facility] relion Ultima meter to go with test strips LANCETS   MISC (LANCETS) to test blood glucose twice daily  Current Allergies: No known allergies   Past Medical History:    Reviewed history from 01/02/2008 and no changes required:       Asthma       Diabetes mellitus, type II       HSV       Migraine headaches          - normal head CT scan 08/06/06       Carpal tunnel syndrome       last pap 12/2007, no hx abnormals.   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   Risk Factors:  Tobacco use:  never Alcohol use:  no Exercise:  no Seatbelt use:  100 %  PAP Smear History:    Date of Last PAP Smear:  01/02/2008   Review of  Systems      See HPI   Physical Exam  General:     obese Eyes:     anicteric Mouth:     MM pink and moist Lungs:     normal respiratory effort and no accessory muscle use.   Neurologic:     alert & oriented X3, cranial nerves II-XII intact, and gait normal.   Psych:     Oriented X3, memory intact for recent and remote, normally interactive, good eye contact, not anxious appearing, and not depressed appearing.    Diabetes Management Exam:    Foot Exam (with socks and/or shoes not present):       Sensory-Monofilament:          Left foot: normal          Right foot: normal    Impression & Recommendations:  Problem # 1:  DIABETES MELLITUS, TYPE II (ICD-250.00) Pt is definitely not at goal.  However, she admits to not taking her medications over the past few weeks. Advised to restart. Had lenghty discussion for need to obtain better control, including likely need for insulin.  I mentioned I would personally start, but she is not interested.  She does not like to inject herself.  I told her needles are small  and can use a very small guage needle and to think about it since I don't think she will get to goal on by mouth meds. Foot and eye exams up to date. Lipids, see separate. No proteinuria BP above goal, not taking meds.  Her updated medication list for this problem includes:    Glipizide 10 Mg Tabs (Glipizide) .Marland Kitchen... Take 1 tablet by mouth twice a day    Glucophage 1000 Mg Tabs (Metformin hcl) .Marland Kitchen... Take 1 tablet by mouth two times a day    Lisinopril 10 Mg Tabs (Lisinopril) .Marland Kitchen... Take one tablet by mouth daily.    Aspirin 81 Mg Chew (Aspirin) .Marland Kitchen... Take 1 tablet by mouth once a day  Orders: Capillary Blood Glucose (82948) Fingerstick (16109)   Problem # 2:  HYPERLIPIDEMIA (ICD-272.4) Pt changed from pravachol to lipitor. Copay too expensive, has not filled. Discussed with Marcelle Smiling, she will speak and provide pt with coupons that provide $50 towards copay. Will need to repeat lipids and LFTs in 3 months.  Her updated medication list for this problem includes:    Lipitor 40 Mg Tabs (Atorvastatin calcium) .Marland Kitchen... Take one tablet daily for cholesterol.  Labs Reviewed: Chol: 232 (01/02/2008)   HDL: 32 (01/02/2008)   LDL: 158 (01/02/2008)   TG: 212 (01/02/2008) SGOT: 16 (05/16/2007)   SGPT: 17 (05/16/2007)   Problem # 3:  ESSENTIAL HYPERTENSION (ICD-401.9) Above goal. However, she has had values near goal in the past. Since she is not taking her meds, advised to start and will follow. If continues above goal, would increase to 20mg  by mouth daily.  Her updated medication list for this problem includes:    Lisinopril 10 Mg Tabs (Lisinopril) .Marland Kitchen... Take one tablet by mouth daily.  BP today: 150/89 Prior BP: 127/89 (01/02/2008)  Labs Reviewed: Creat: 0.76 (01/02/2008) Chol: 232 (01/02/2008)   HDL: 32 (01/02/2008)   LDL: 158 (01/02/2008)   TG: 212 (01/02/2008)   Problem # 4:  MORBID OBESITY (ICD-278.01) Had a very long discussion about weight since pt was interested in options.    Discussed diet, exercise, medications, and surgery including pros/cons for most. Provided pt with information on all of the above mentioned from "pt info" section  from UpToDate.  Complete Medication List: 1)  Glipizide 10 Mg Tabs (Glipizide) .... Take 1 tablet by mouth twice a day 2)  Glucophage 1000 Mg Tabs (Metformin hcl) .... Take 1 tablet by mouth two times a day 3)  Lisinopril 10 Mg Tabs (Lisinopril) .... Take one tablet by mouth daily. 4)  Aspirin 81 Mg Chew (Aspirin) .... Take 1 tablet by mouth once a day 5)  Imipramine Hcl 50 Mg Tabs (Imipramine hcl) .... Take 1 tablet by mouth at bedtime 6)  Lipitor 40 Mg Tabs (Atorvastatin calcium) .... Take one tablet daily for cholesterol. 7)  Relion Ultima Test Strp (Glucose blood) .... To test blood glucose twice daily 8)  Blood Glucose Meter Kit (Blood glucose monitoring suppl) .... [facility] relion ultima meter to go with test strips 9)  Lancets Misc (Lancets) .... To test blood glucose twice daily   Patient Instructions: 1)  Please schedule a follow-up appointment in 1 month. 2)  Restart your metformin and glipizide. 3)  Remember our conversation about insulin. 4)  Speak to [employer] on your way out, she can provide coupons for your copaymant for the lipitor.  5)  It is important that you exercise regularly at least 20 minutes 5 times a week. If you develop chest pain, have severe difficulty breathing, or feel very tired , stop exercising immediately and seek medical attention. 6)  You need to lose weight. Consider a lower calorie diet and regular exercise.  7)  Take an Aspirin every day. 8)  Check your blood sugars regularly. If your readings are usually above : or below 70 you should contact our office. 9)  Check your feet each night for sore areas, calluses or signs of infection.    Last LDL:                                                 158 (01/02/2008 9:02:00 PM)        Diabetic Foot Exam Last Podiatry Exam Date:  03/04/2008  Foot Inspection Is there a history of a foot ulcer?              No Is there a foot ulcer now?              No Can the patient see the bottom of their feet?          Yes Are the shoes appropriate in style and fit?          Yes Is there swelling or an abnormal foot shape?          No Are the toenails long?                No Are the toenails thick?                No Are the toenails ingrown?              No Is there heavy callous build-up?              No Is there a claw toe deformity?                          No Is there elevated skin temperature?            No  Pulse Check          Right Foot          Left Foot Posterior Tibial:        2+            2+ Dorsalis Pedis:        2+            2+  High Risk Feet? No   10-g (5.07) Semmes-Weinstein Monofilament Test Performed by: Chinita Pester RN          Right Foot          Left Foot Visual Inspection     normal         normal Test Control      normal         normal Site 1         normal         normal Site 2         normal         normal Site 3         normal         normal Site 4         normal         normal Site 5         normal         normal Site 6         normal         normal Site 7         normal         normal Site 8         normal         normal Site 9         normal         normal  Impression      normal         normal

## 2010-02-21 NOTE — Assessment & Plan Note (Signed)
Summary: ACUTE-OUT OF MEDS UNABLE TO SEE [facility])   Vital Signs:  Patient Profile:   45 Years Old Female Height:     60.5 inches (153.67 cm) Weight:      209.8 pounds (95.36 kg) BMI:     40.44 Temp:     98.2 degrees F (36.78 degrees C) oral Pulse rate:   102 / minute BP sitting:   136 / 88  (left arm)  Pt. in pain?   no  Vitals Entered By: Stanton Kidney Ditzler RN (May 16, 2007 3:17 PM)              Is Patient Diabetic? Yes  Nutritional Status BMI of > 30 = obese Nutritional Status Detail good CBG Result 170  Have you ever been in a relationship where you felt threatened, hurt or afraid?denies   Does patient need assistance? Functional Status Self care Ambulation Normal     PCP:  Manning Charity MD  Chief Complaint:  Out of all meds x 2 months - husbanf out of work..  History of Present Illness: Pt is a 45 yo female w/ PMHx of diabetes here for f/u.  She has not taken any medications for any of her problems in at least two months b/c of difficult finances.  She is here for medication  refill, specifically BP medication, b/c she went to her dentist for a toothache and needs two teeth pulled but her BP was elevated and they told her they wouldn't extract her teeth unless she went to her PCP.  Her young son is w/ her today and she has no complaints, except for an occasional toothache.  She has not been taking her blood sugar or pressure at home but does have a meter.      Updated Prior Medication List: However, pt not taking any medications in 2 months b/c of finances.   GLIPIZIDE 10 MG TABS (GLIPIZIDE) Take 1 tablet by mouth twice a day GLUCOPHAGE 1000 MG TABS (METFORMIN HCL) Take 1 tablet by mouth two times a day ALBUTEROL 90 MCG/ACT AERS (ALBUTEROL) as needed LISINOPRIL 20 MG TABS (LISINOPRIL) Take 1 tablet by mouth once a day ASPIRIN 81 MG CHEW (ASPIRIN) Take 1 tablet by mouth once a day IMIPRAMINE HCL 50 MG  TABS (IMIPRAMINE HCL) Take 1 tablet by mouth at bedtime  Current  Allergies (reviewed today): No known allergies   Past Medical History:    Reviewed history from 09/02/2006 and no changes required:       Asthma       Diabetes mellitus, type II       HSV       Migraine headaches          - normal head CT scan 08/06/06       Carpal tunnel syndrome  Past Surgical History:    Reviewed history and no changes required:       Cholecystectomy   Social History:    Reviewed history from 11/28/2006 and no changes required:       Building surveyor       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   Risk Factors: Tobacco use:  never  PAP Smear History:    Date of Last PAP Smear:  03/27/2004   Review of Systems       As per HPI.   Physical Exam  General:     Overweight appearing, appropriately groomed, no  distress. Lungs:     CTA bilaterally, normal expansion, normal effort. Heart:     Normal rate and regular rhythm. S1 and S2 normal without gallop, murmur, click, rub or other extra sounds. Abdomen:     Bowel sounds positive,abdomen soft and non-tender without masses or organomegaly. Extremities:     No edema, clubbing, or cyanosis.    Impression & Recommendations:  Problem # 1:  DIABETES MELLITUS, TYPE II (ICD-250.00) Pt overdue for labs and last hgba1c 11.  Needs to see Jamison Neighbor and opthamologist, so will place referral.  However, pt states she is not going to do either b/c of money and feels like she is going to "leave it in God's hands."  I will refill her medications if her labs are normal, but told her that several medications she will need affect the the kidney's, etc and she will need to come back fairly quickly for at least the next month or two for blood work.  If her a1c is significantly elevated, she will likely need insulin, but I will not write for that if we can't get her in w/ [name] and get the appropriate education on how to use it.  Will f/u in one month for repeat  BMET and will do diabetic foot exam at that time.  Will also check cholesterol w/ goal LDL < 70.  Her updated medication list for this problem includes:    Glipizide 10 Mg Tabs (Glipizide) .Marland Kitchen... Take 1 tablet by mouth twice a day    Glucophage 1000 Mg Tabs (Metformin hcl) .Marland Kitchen... Take 1 tablet by mouth two times a day    Lisinopril 10 Mg Tabs (Lisinopril) .Marland Kitchen... Take one tablet by mouth daily.    Aspirin 81 Mg Chew (Aspirin) .Marland Kitchen... Take 1 tablet by mouth once a day  Orders: T- Capillary Blood Glucose (56387) T-Hgb A1C (in-house) (56433IR) T-Comprehensive Metabolic Panel ([phone]) T-Lipid Profile (30160-10932) T-Urine Microalbumin w/creat. ratio (35573 / [address]) Diabetic Clinic Referral (Diabetic) Ophthalmology Referral (Ophthalmology)  Labs Reviewed: HgBA1c: 10.8 (05/16/2007)   Creat: 0.70 (07/08/2006)      Problem # 2:  ESSENTIAL HYPERTENSION (ICD-401.9) BP around goal for diabetic, but states it was high at her dentist, but she was in pain.  Will start her on a lower dose of lisinopril primarily for renal protection and f/u in one month for repeat BMET before refilling again.   Her updated medication list for this problem includes:    Lisinopril 10 Mg Tabs (Lisinopril) .Marland Kitchen... Take one tablet by mouth daily.  BP today: 136/88 Prior BP: 134/84 (11/28/2006)  Labs Reviewed: Creat: 0.70 (07/08/2006)   Problem # 3:  Preventive Health Care (ICD-V70.0) Overdue for pap smear.  Will f/u in one month for well woman exam and consider starting mammograms annually.    Complete Medication List: 1)  Glipizide 10 Mg Tabs (Glipizide) .... Take 1 tablet by mouth twice a day 2)  Glucophage 1000 Mg Tabs (Metformin hcl) .... Take 1 tablet by mouth two times a day 3)  Albuterol 90 Mcg/act Aers (Albuterol) .... As needed 4)  Lisinopril 10 Mg Tabs (Lisinopril) .... Take one tablet by mouth daily. 5)  Aspirin 81 Mg Chew (Aspirin) .... Take 1 tablet by mouth once a day 6)  Imipramine Hcl 50 Mg  Tabs (Imipramine hcl) .... Take 1 tablet by mouth at bedtime   Patient Instructions: 1)  Please followup in one month for a well woman exam. 2)  Please see Jamison Neighbor for your diabetes.  3)  Please take your blood sugars twice a day and bring your meter or write them down and bring it in.      Prescriptions: LISINOPRIL 10 MG  TABS (LISINOPRIL) Take one tablet by mouth daily.  #30 x 0   Entered and Authorized by:   Joaquin Courts  MD   Signed by:   Joaquin Courts  MD on 05/19/2007   Method used:   Electronically sent to ...       [facility]       [address]       [place], [place]  [address]       Ph: [phone]       Fax: [phone]   RxID:   [id number]  ]  Vital Signs:  Patient Profile:   45 Years Old Female Height:     60.5 inches (153.67 cm) Weight:      209.8 pounds (95.36 kg) BMI:     40.44 Temp:     98.2 degrees F (36.78 degrees C) oral Pulse rate:   102 / minute BP sitting:   136 / 88             CBG Result 170 Last PAP Result Normal PapHx  Normal (03/27/2004 11:26:39 AM)     Laboratory Results   Blood Tests   Date/Time Recieved: .May 16, 2007 4:07 PM Date/Time Reported: ..................................................................Marland KitchenAlric Quan  May 16, 2007 4:07 PM   HGBA1C: 10.8%   (Normal Range: Non-Diabetic - 3-6%   Control Diabetic - 6-8%) CBG Random: 170

## 2010-02-21 NOTE — Progress Notes (Signed)
Summary: diabetes support/dmr  Phone Note Outgoing Call   Call placed by: Jamison Neighbor RD,CDE,  March 08, 2008 2:19 PM Summary of Call: tried to call to discuss her complaint of painful blood sugar testing.left a message for her to return call.

## 2010-02-21 NOTE — Assessment & Plan Note (Signed)
Summary: CHECKUP/DS   Vital Signs:  Patient Profile:   45 Years Old Female Height:     60.5 inches (153.67 cm) Weight:      214.9 pounds (97.68 kg) BMI:     41.43 Temp:     98.5 degrees F (36.94 degrees C) oral Pulse rate:   91 / minute BP sitting:   152 / 90  (right arm)  Pt. in pain?   yes    Location:   HEADACHE    Intensity:   8    Type:       aching  Vitals Entered ByFilomena Jungling NT II (August 20, 2007 10:12 AM)              Is Patient Diabetic? Yes  Nutritional Status BMI of 25 - 29 = overweight CBG Result 280  Have you ever been in a relationship where you felt threatened, hurt or afraid?No   Does patient need assistance? Functional Status Self care Ambulation Normal     PCP:  Manning Charity MD  Chief Complaint:  USE TO BE ON MEDS FOR MIGRAINE.  History of Present Illness: This is a 45 year old woman with past medical history of   Asthma Diabetes mellitus, type II HSV Migraine headaches    - normal head CT scan 08/06/06 Carpal tunnel syndrome  Here today with a headache.  She reports headaches on and off for the past three weeks lasting a few hours associated with photophobia, fatigue and nausea and vomiting (vomiting only this morning).  Headache is over the entire head, worst in back of head and behind eyes. Has had migraines for years and this is what they feel like. Last one she had a year ago and had to come to the ED. This episode started early this morning, she called the pharmacy and was instructed to take 100mg  imipramine and 25mg  phenergan which has helped a little.  She has not checked her CBG for several days.  She is not menstruating.  Does not have allergies. She has had no fevers, syncope or confusion.       Current Allergies: No known allergies   Past Medical History:    Asthma    Diabetes mellitus, type II    HSV    Migraine headaches       - normal head CT scan 08/06/06    Carpal tunnel syndrome    last pap 2006, no hx abnormals.    Risk Factors: Tobacco use:  never  PAP Smear History:    Date of Last PAP Smear:  03/27/2004   Review of Systems       The patient complains of weight gain and headaches.  The patient denies fever and syncope.         no dysuria.  some diarrhea today.    Physical Exam  General:     alert and overweight-appearing.   Head:     normocephalic and atraumatic.   Eyes:     vision grossly intact, pupils equal, pupils round, and pupils reactive to light.   Ears:     no external deformities.   Nose:     no external deformity.   Mouth:     good dentition and pharynx pink and moist.   Neck:     supple, full ROM, and no masses.  n onuchal rigidity. Lungs:     normal respiratory effort and normal breath sounds.   Heart:     distant, normal rate,  regular rhythm, and no murmur.   Neurologic:     alert & oriented X3, cranial nerves II-XII intact, and strength normal in all extremities.   Cervical Nodes:     no anterior cervical adenopathy and no posterior cervical adenopathy.   Psych:     Oriented X3, memory intact for recent and remote, and normally interactive.      Impression & Recommendations:  Problem # 1:  COMMON MIGRAINE (ICD-346.10) Will refill imipramine as this had worked for her in the pastwith no negative effect  at 50mg  at bedtime as needed.   However, if she continues to have headaches she may benefit from a BB or Ca++blocker which would address both her BP and her headaches.    Her updated medication list for this problem includes:    Aspirin 81 Mg Chew (Aspirin) .Marland Kitchen... Take 1 tablet by mouth once a day   Problem # 2:  DIABETES MELLITUS, TYPE II (ICD-250.00) A1C is 9.3 today. She reports fairly good medication compliance only forgetting to take medications once a week or so.  9.3 is improved from previous 10, but is still not optimal. I explained that she is on maximum dose of glipizide and glucophage and will need to consider insulin if control does not  improve.  She has gained weight in the past monhts.  She has met with Scherrie Gerlach and does not want to meet with her again at this time.  She agreed to try to control her diet and exercise (is not currently doing either).  She has a booklet on diabetic diet from [employer].  She will go back through the book and try to adhere to the diet.  She will keep a food and exercise journal and come back in one month to discuss her progress.  Her updated medication list for this problem includes:    Glipizide 10 Mg Tabs (Glipizide) .Marland Kitchen... Take 1 tablet by mouth twice a day    Glucophage 1000 Mg Tabs (Metformin hcl) .Marland Kitchen... Take 1 tablet by mouth two times a day    Lisinopril 10 Mg Tabs (Lisinopril) .Marland Kitchen... Take one tablet by mouth daily.    Aspirin 81 Mg Chew (Aspirin) .Marland Kitchen... Take 1 tablet by mouth once a day  Orders: T- Capillary Blood Glucose (36144) T-Hgb A1C (in-house) (31540GQ)  Labs Reviewed: HgBA1c: 9.3 (08/20/2007)   Creat: 0.70 (05/16/2007)   Microalbumin: 1.39 (05/16/2007)   Problem # 3:  ESSENTIAL HYPERTENSION (ICD-401.9) BP high today, but pt in pain.  She is going to try diet and exercise modification which should help with bp as well.  Refilled lisinopril, had BMET 4/09, renal fxn fine on ACE.  Her updated medication list for this problem includes:    Lisinopril 10 Mg Tabs (Lisinopril) .Marland Kitchen... Take one tablet by mouth daily.  BP today: 152/90 Prior BP: 136/88 (05/16/2007)  Labs Reviewed: Creat: 0.70 (05/16/2007) Chol: 215 (05/16/2007)   HDL: 36 (05/16/2007)   LDL: 152 (05/16/2007)   TG: 136 (05/16/2007)   Problem # 4:  HYPERLIPIDEMIA (ICD-272.4) Has been on pravachol for several months.  Will need to check CMET at her next appointment (not today as she is currently having migraine) to be sure no hepatic effect.  No myalgias.  Her updated medication list for this problem includes:    Pravachol 20 Mg Tabs (Pravastatin sodium) .Marland Kitchen... Take 1 tablet by mouth once a day  Labs Reviewed: Chol: 215  (05/16/2007)   HDL: 36 (05/16/2007)   LDL: 152 (05/16/2007)  TG: 136 (05/16/2007) SGOT: 16 (05/16/2007)   SGPT: 17 (05/16/2007)  Orders: T-Comprehensive Metabolic Panel ([id number])   Problem # 5:  Preventive Health Care (ICD-V70.0) she needs a pap smear at her next appointment, did not do today due to migrain headache.  Complete Medication List: 1)  Glipizide 10 Mg Tabs (Glipizide) .... Take 1 tablet by mouth twice a day 2)  Glucophage 1000 Mg Tabs (Metformin hcl) .... Take 1 tablet by mouth two times a day 3)  Albuterol 90 Mcg/act Aers (Albuterol) .... As needed 4)  Lisinopril 10 Mg Tabs (Lisinopril) .... Take one tablet by mouth daily. 5)  Aspirin 81 Mg Chew (Aspirin) .... Take 1 tablet by mouth once a day 6)  Imipramine Hcl 50 Mg Tabs (Imipramine hcl) .... Take 1 tablet by mouth at bedtime 7)  Pravachol 20 Mg Tabs (Pravastatin sodium) .... Take 1 tablet by mouth once a day 8)  [employer] Ultima Test Strp (Glucose blood) .... To test blood glucose twice daily 9)  Blood Glucose Meter Kit (Blood glucose monitoring suppl) .... [employer] relion ultima meter to go with test strips 10)  Lancets Misc (Lancets) .... To test blood glucose twice daily   Patient Instructions: 1)  Please schedule a follow-up appointment in 1 month. 2)  You are doing a good job controling your diabetes, but you need to do even better to avoid insulin. You should avoid sugary foods and drinks and follow the diabetic diet in the booklet given to you by Jamison Neighbor.   3)  You should exercise for 30 minutes most days of the week. 4)  We will discuss your diet, exercise and weightloss efforts at your next appointment, so record your activity in a journal and bring it to your appointment.   Prescriptions: LISINOPRIL 10 MG  TABS (LISINOPRIL) Take one tablet by mouth daily.  #30 x 6   Entered and Authorized by:   Elby Showers MD   Signed by:   Elby Showers MD on 08/20/2007   Method used:   Electronically sent to  ...       [facility]       [address]       [place], [place]  [address]       Ph: [phone]       Fax: [phone]   RxID:   [id number] GLUCOPHAGE 1000 MG TABS (METFORMIN HCL) Take 1 tablet by mouth two times a day  #62 x 3   Entered and Authorized by:   Elby Showers MD   Signed by:   Elby Showers MD on 08/20/2007   Method used:   Electronically sent to ...       [facility]       [address]       [place], [place]  [address]       Ph: [phone]       Fax: [phone]   RxID:   [id number] GLIPIZIDE 10 MG TABS (GLIPIZIDE) Take 1 tablet by mouth twice a day  #62 x 3   Entered and Authorized by:   Elby Showers MD   Signed by:   Elby Showers MD on 08/20/2007   Method used:   Electronically sent to ...       [facility]       [address]  [place], [place]  [address]       Ph: [phone]       Fax: [phone]   RxID:   [id number] PRAVACHOL 20 MG  TABS (PRAVASTATIN SODIUM) Take 1 tablet by mouth once a day  #30 x 6   Entered and Authorized by:   Elby Showers MD   Signed by:   Elby Showers MD on 08/20/2007   Method used:   Electronically sent to ...       [facility]       [address]       [place], [place]  [address]       Ph: [phone]       Fax: [phone]   RxID:   [id number] IMIPRAMINE HCL 50 MG  TABS (IMIPRAMINE HCL) Take 1 tablet by mouth at bedtime  #30 x 5   Entered and Authorized by:   Elby Showers MD   Signed by:   Elby Showers MD on 08/20/2007   Method used:   Electronically sent to ...       [facility]       [address]       [place], [place]  [address]       Ph: [phone]       Fax: [phone]   RxID:   [id number]  ]  Vital Signs:  Patient Profile:   45 Years Old Female Height:     60.5 inches (153.67 cm) Weight:       214.9 pounds (97.68 kg) BMI:     41.43 Temp:     98.5 degrees F (36.94 degrees C) oral Pulse rate:   91 / minute BP sitting:   152 / 90    Location:   HEADACHE    Intensity:   8    Type:       aching             CBG Result 280 Last PAP Result Normal PapHx  Normal (03/27/2004 11:26:39 AM)     Laboratory Results   Blood Tests   Date/Time Received: August 20, 2007 10:39 AM  Date/Time Reported: Oren Beckmann  August 20, 2007 10:39 AM   HGBA1C: 9.3%   (Normal Range: Non-Diabetic - 3-6%   Control Diabetic - 6-8%) CBG Random:: 280mg /dL

## 2010-02-21 NOTE — Assessment & Plan Note (Signed)
Summary: ACUTE-ER/FU FOR MIGRANES/(KIRK)/CFB   Vital Signs:  Patient Profile:   45 Years Old Female Height:     60.5 inches (153.67 cm) Weight:      212.5 pounds (96.59 kg) BMI:     40.97 Temp:     99.0 degrees F (37.22 degrees C) oral Pulse rate:   90 / minute BP sitting:   125 / 79  (right arm) Cuff size:   regular  Pt. in pain?   no  Vitals Entered By: Krystal Eaton Duncan Dull) (September 02, 2006 8:49 AM)              Is Patient Diabetic? Yes  Nutritional Status BMI of > 30 = obese  Have you ever been in a relationship where you felt threatened, hurt or afraid?No   Does patient need assistance? Functional Status Self care Ambulation Normal   Chief Complaint:  ER f/u-"headaches" requests rx for migraines.  History of Present Illness: Pt is a 45 yo woman with a hx of obesity, HTN, DM, asthma and a hx of common migraines.  Was seen at the ED on 7/15 for a bad h/a: throbbing, severe, insidious onset, not relieved by Advil, N/V, diarrhea, photophobia. Worst h/a she's had because had never vomitted before. First had a "migraine" last year at which time she was prescribed an anti-migraine medicine. Never needed to refill her prescription but she ran out of them. Had a CT scan done in the ED : normal report. Wants a prescription for the migraine medicine Dr. Lyda Perone gave her in the past. No vision changes, night sweats, weight loss. Feels tired.   Current Allergies (reviewed today): No known allergies   Past Medical History:    Asthma    Diabetes mellitus, type II    HSV    Migraine headaches       - normal head CT scan 08/06/06    Carpal tunnel syndrome   Social History:    Daycare teacher   Risk Factors: Tobacco use:  never  PAP Smear History:    Date of Last PAP Smear:  03/27/2004   Review of Systems      See HPI   Physical Exam  General:     alert, well-developed, well-nourished, and well-hydrated.   Head:     atraumatic.   Eyes:     vision grossly  intact, pupils equal, pupils round, and pupils reactive to light.  Anicteric, EOMI. Mouth:     OP clear, MMM. Neck:     Supple, no lymphadnp/tm Lungs:     CTAB with good air mvt Heart:     RRR, no m/r/g Abdomen:     BS+, soft, NT, ND, no palpable masses. Pulses:     Pedal pulses 2+ bilaterally. Extremities:     No edema/cyanosis/clubbing. Neurologic:     alert & oriented X3, cranial nerves II-XII intact, strength normal in all extremities, sensation intact to light touch, gait normal, and DTRs symmetrical and normal.      Impression & Recommendations:  Problem # 1:  COMMON MIGRAINE (ICD-346.10) Prior hx of common migraines. No alarm sx (had a head CT scan done in the ED anyway). Agreed to resume imipramine 50 at bedtime which is what Dr. Lyda Perone had started her on in 2007 for prevention of her h/a.  Her updated medication list for this problem includes:    Aspirin 81 Mg Chew (Aspirin) .Marland Kitchen... Take 1 tablet by mouth once a day   Problem # 2:  ESSENTIAL  HYPERTENSION (ICD-401.9) Encouraged pt to continue taking her lisinopril.  Her updated medication list for this problem includes:    Lisinopril 20 Mg Tabs (Lisinopril) .Marland Kitchen... Take 1 tablet by mouth once a day  BP today: 125/79 Prior BP: 112/79 (07/11/2006)  Labs Reviewed: Creat: 0.70 (07/08/2006)   Complete Medication List: 1)  Glipizide 10 Mg Tabs (Glipizide) .... Take 1 tablet by mouth twice a day 2)  Glucophage 1000 Mg Tabs (Metformin hcl) .... Take 1 tablet by mouth two times a day 3)  Albuterol 90 Mcg/act Aers (Albuterol) .... As needed 4)  Lisinopril 20 Mg Tabs (Lisinopril) .... Take 1 tablet by mouth once a day 5)  Aspirin 81 Mg Chew (Aspirin) .... Take 1 tablet by mouth once a day 6)  Patanol 0.1 % Soln (Olopatadine hcl) .Marland Kitchen.. 1 drop each eye every 6-8 hours, up to twice a day 7)  Imipramine Hcl 50 Mg Tabs (Imipramine hcl) .... Take 1 tablet by mouth at bedtime   Patient Instructions: 1)  You have been restarted on  Imipramine of which you will take one 50 mg tab by mouth at bedtime to prevent headaches. If you do not see a significant improvement in your headaches, please come back witin the next month. If you notice any side effects to your medication, please stop it and come back to see [name].    Prescriptions: IMIPRAMINE HCL 50 MG  TABS (IMIPRAMINE HCL) Take 1 tablet by mouth at bedtime  #30 x 5   Entered and Authorized by:   Olene Craven MD   Signed by:   Olene Craven MD on 09/02/2006   Method used:   Electronically sent to ...       [facility]       [address]       [place], [place]  [address]       Ph: [phone]       Fax: [phone]   RxID:   [id number]

## 2010-02-21 NOTE — Progress Notes (Signed)
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 Initial call taken by: Marin Roberts RN,  December 01, 2007 9:52 AM  Follow-up for Phone Call        If patient has been afebrile (Tmax <100.4) and asymptomatic for 24 hours or more without the use of Tylenol or Advil, OK to return to work.  Follow-up by: Manning Charity MD,  December 01, 2007 10:02 AM  Additional Follow-up for Phone Call Additional follow up Details #1::        spoke w/ pt, used tylenol last night, will refrain from tylenol/ advil til tomorrow morning and will monitor temp Additional Follow-up by: Marin Roberts RN,  December 01, 2007 12:05 PM

## 2010-02-21 NOTE — Assessment & Plan Note (Signed)
Summary: F/U DM. HTN, HLD - out of meds x1 year. New LLE laceration.   Vital Signs:  Patient profile:   45 year old female Height:      60.5 inches (153.67 cm) Weight:      201.0 pounds (91.36 kg) BMI:     38.75 Temp:     98.4 degrees F (36.89 degrees C) oral Pulse rate:   110 / minute BP sitting:   146 / 85  (left arm) Cuff size:   regular  Vitals Entered By: Theotis Barrio NT II (October 11, 2009 3:13 PM) CC: PATIENT IS HERE FOR TB SKIN TEST FOR EMPLOYMENT / MEDICATION REFILL(NOT MEDS FOR ABOUT A YEAR)/ REFUSED THE FLU SHOT Is Patient Diabetic? Yes Did you bring your meter with you today? No Pain Assessment Patient in pain? no      Nutritional Status BMI of > 30 = obese CBG Result 462  Have you ever been in a relationship where you felt threatened, hurt or afraid?No   Does patient need assistance? Functional Status Self care Ambulation Normal Comments MEDICATION REFILL(NO MEDS FOR ABOUT A YEAR) / TB SKIN TEST FOR EMPLOYMENT  / LEFT SHIN LACERATION   Diabetic Foot Exam Last Podiatry Exam Date: 03/04/2008 Foot Inspection Is there a history of a foot ulcer?              No Is there a foot ulcer now?              No Can the patient see the bottom of their feet?          Yes Are the shoes appropriate in style and fit?          Yes Is there swelling or an abnormal foot shape?          No Are the toenails long?                No Are the toenails thick?                No Are the toenails ingrown?              No Is there heavy callous build-up?              No Is there pain in the calf muscle (Intermittent claudication) when walking?    NoIs there a claw toe deformity?              No Is there elevated skin temperature?            No Is there limited ankle dorsiflexion?            No Is there foot or ankle muscle weakness?            No  Diabetic Foot Care Education Patient educated on appropriate care of diabetic feet.  Pulse Check          Right Foot          Left  Foot Posterior Tibial:        normal            normal Dorsalis Pedis:        normal            normal  High Risk Feet? No   10-g (5.07) Semmes-Weinstein Monofilament Test Performed by: Theotis Barrio NTII          Right Foot          Left Foot Visual Inspection  normal           normal  Impression      normal         normal   Primary Care Provider:  [name] DO  CC:  PATIENT IS HERE FOR TB SKIN TEST FOR EMPLOYMENT / MEDICATION REFILL(NOT MEDS FOR ABOUT A YEAR)/ REFUSED THE FLU SHOT.  History of Present Illness: Pt is a 45 yo female with PMHx of DMII, HLD, HTN who presents to clinic today for multiple concerns, including the following:   1) DMII - Patients states that she has been out of medications x 1 year for financial reasons. Patient not checking blood sugars regularly - unable to afford test strips.  Was supposed to taking Metformin 1000mg  BID and Glipizide 10mg  BID. Exercising 45-60 minutes 3 days a week. No abdominal pain, no nausea, no polyuria, no polyphagia, no blurry vision.   2) HTN - off of medications for past 1 year, Not checking blood pressures regularly. Was supposed to be oin HCTZ 25mg  and Lisinopril 10mg . No dizziness, lightheadedness, chest pain, syncope.   3) Left leg shin laceration - obtained 1 week ago moving boxes, mildly tender, seems to be slowly healing. No warmth, surrounding erythema, pus, drainage. No fevers, chills.   4) HLD - off of meds x 1 year, supposed to be on Pravachol 40mg  daily. No chest pain, palpitations, difficulty breathing.  5) Requests PPD - starting new job tomorrow.  6) Preventative Care: Last FLP: 12/2007 - LDL 158, Trigs 212 Last HgA1c: 03/2008 - 8.8 Last Microalbumin/Creatinine ratio: 02/2008 - 6.5 Last eye exam: 12/2007 - wnl Last foot exam: 02/2008 - wnl Last CMP: 04/2007 - wnl Last PAP: 12/2007 - wnl Last mammogram: never Last TFT: none    Preventive Screening-Counseling &  Management  Alcohol-Tobacco     Alcohol drinks/day: 0     Smoking Status: never  Caffeine-Diet-Exercise     Diet Comments: cutting out sweets and salty foods     Diet Counseling: provided support     Nutrition Referrals: no     Does Patient Exercise: yes     Type of exercise: gym classes  Current Medications (verified): 1)  None  Allergies (verified): No Known Drug Allergies  Past History:  Past Medical History: Asthma Diabetes mellitus, type II - 1999 (started as gestational diabetes) HSV Migraine headaches    - normal head CT scan 08/06/06 Carpal tunnel syndrome last pap 12/2007, no hx abnormals.  Review of Systems       Per HPI  Physical Exam  General:  Well-developed,well-nourished,in no acute distress; alert,appropriate and cooperative throughout examination Head:  Normocephalic and atraumatic without obvious abnormalities. No apparent alopecia or balding. Eyes:  No corneal or conjunctival inflammation noted. EOMI. Perrla.  Neck:  No deformities, masses, or tenderness noted. Lungs:  Normal respiratory effort, chest expands symmetrically. Lungs are clear to auscultation, no crackles or wheezes. Heart:  Normal rate and regular rhythm. S1 and S2 normal without gallop, murmur, click, rub or other extra sounds. Abdomen:  Bowel sounds positive,abdomen soft and non-tender without masses, organomegaly or hernias noted. Pulses:  R dorsalis pedis normal and L dorsalis pedis normal.   Extremities:  no pretibial edema bilaterally. left anterior shin with approximately 5cm healing laceration without surrounding erythema, no warmth, no drainage.   Diabetes Management Exam:    Foot Exam (with socks and/or shoes not present):       Sensory-Monofilament:          Left  foot: normal          Right foot: normal   Impression & Recommendations:  Problem # 1:  DIABETES MELLITUS, TYPE II (ICD-250.00) Poorly controlled, pt out of medications x 1 year. Refuses insulin at this time -  likely will require in the future given extent of her diabetes. - Will start Metformin 1000mg  two times a day and Glipizide 10mg  two times a day - Reassess next visit - Diabetes Clinic referral for help with MAP, glucometer, and overall diabetes education   The following medications were removed from the medication list:    Glipizide 10 Mg Tabs (Glipizide) .Marland Kitchen... Take 1 tablet by mouth twice a day    Lisinopril 10 Mg Tabs (Lisinopril) .Marland Kitchen... Take one tablet by mouth daily.    Aspirin 81 Mg Chew (Aspirin) .Marland Kitchen... Take 1 tablet by mouth once a day Her updated medication list for this problem includes:    Glucophage 1000 Mg Tabs (Metformin hcl) .Marland Kitchen... Take 1 tablet by mouth two times a day    Glipizide Xl 10 Mg Xr24h-tab (Glipizide) .Marland Kitchen... Take 1 tablet by mouth once a day  Orders: T- Capillary Blood Glucose (82948) T-Hgb A1C (in-house) (16109UE) T-Urinalysis (45409-81191) T-Urine Microalbumin w/creat. ratio [phone]) T-Comprehensive Metabolic Panel [phone]) T-CBC w/Diff ([id number]) Diabetic Clinic Referral (Diabetic)  Problem # 2:  ESSENTIAL HYPERTENSION (ICD-401.9) Uncontrolled currently.  - will order CMP to assess kidney function, electrolytes, pending results, consider resumption of Lisinopril +/- HCTZ  The following medications were removed from the medication list:    Lisinopril 10 Mg Tabs (Lisinopril) .Marland Kitchen... Take one tablet by mouth daily.    Hydrochlorothiazide 25 Mg Tabs (Hydrochlorothiazide) .Marland Kitchen... Take one tablet daily for blood pressure.  Problem # 3:  PREVENTIVE HEALTH CARE (ICD-V70.0) Last FLP: 12/2007 - LDL 158, Trigs 212 --> order today Last HgA1c: 03/2008 - 8.8 --> order today Last Microalbumin/Creatinine ratio: 02/2008 - 6.5 --> order today Last eye exam: 12/2007 - wnl --> order next visit Last foot exam: 02/2008 - wnl --> complete today Last CMP: 04/2007 - wnl --> order today Last PAP: 12/2007 - wnl --> consider next visit Last mammogram: never -->  order today Last TFT: none --> order today PPD: --> place today, read 09/22  Problem # 4:  OPEN WOUND OTH&UNSPEC PART TRNK W/O MENTION COMP (ICD-879.6) healing, no signs of infection. 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 Will review chart, and recomend tetanus booster if none documented.  Problem # 5:  HYPERLIPIDEMIA (ICD-272.4) Pt not on meds currently. - Will check CMET to assess liver function - Will check lipid panel - consider restart meds as inidcated.  The following medications were removed from the medication list:    Pravachol 40 Mg Tabs (Pravastatin sodium) .Marland Kitchen... Take two tablets daily.  Complete Medication List: 1)  Glucophage 1000 Mg Tabs (Metformin hcl) .... Take 1 tablet by mouth two times a day 2)  Glipizide Xl 10 Mg Xr24h-tab (Glipizide) .... Take 1 tablet by mouth once a day  Other Orders: TB Skin Test [phone]) Admin 1st Vaccine (66440) Mammogram (Screening) (Mammo) T-Lipid Profile ([id number])  Patient Instructions: 1)  Please follow-up at the clinic in 1 month, at which time we will reevaluate your diabetes, blood pressure, cholesterol. 2)  Please follow-up on Thursday for your PPD test reading.  3)  You have been started on Metformin, if you develop severe abdominal pain, please stop the medication and  call the clinic at [phone]. 4)  You have been started on Glipizide XL, if you develop low blood sugars <70 mg/dL, rash, severe abdominal pain, please call the clinic.  5)  If you have worsening of your symptoms, develop nausea, vomiting, severe abdominal pain, please go to the ER. 6)  Please bring all of your medications in a bag to your next visit. Prescriptions: GLUCOPHAGE 1000 MG TABS (METFORMIN HCL) Take 1 tablet by mouth two times a day  #60 x 5   Entered and Authorized by:   [name] DO   Signed by:   [name] DO on 10/11/2009   Method  used:   Electronically to        [facility] (retail)       [address]       [place], [place]  [address]       Ph: [phone]       Fax: [phone]   RxID:   [id number] GLIPIZIDE XL 10 MG XR24H-TAB (GLIPIZIDE) Take 1 tablet by mouth once a day  #30 x 5   Entered and Authorized by:   [name] DO   Signed by:   [name] DO on 10/11/2009   Method used:   Electronically to        [facility] (retail)       [address]       [place], [place]  [address]       Ph: [phone]       Fax: [phone]   RxID:   [id number]  Process Orders Check Orders Results:     [facility]: ABN not required for this insurance Tests Sent for requisitioning (October 21, 2009 4:31 PM):     10/11/2009: [facility] -- T-Urinalysis [[id number]] (signed)     10/11/2009: [facility] -- T-Urine Microalbumin w/creat. ratio [[id number]] (signed)     10/11/2009: [facility] -- T-Comprehensive Metabolic Panel [[id number]] (signed)     10/11/2009: [facility] -- T-CBC w/Diff [[id number]] (signed)     10/11/2009: [facility] -- T-Lipid Profile [phone] (signed)     Prevention & Chronic Care Immunizations   Influenza vaccine: Not documented   Influenza vaccine deferral: Refused  (10/11/2009)    Tetanus booster: Not documented    Pneumococcal vaccine: Not documented  Other Screening   Pap smear: NEGATIVE FOR INTRAEPITHELIAL LESIONS OR MALIGNANCY.  (01/02/2008)   Pap smear due: 01/2011    Mammogram: Not documented   Mammogram action/deferral: Ordered  (10/11/2009)   Smoking status: never  (10/11/2009)  Diabetes Mellitus   HgbA1C: 12.3  (10/11/2009)   Hemoglobin A1C due: 10/08/2006    Eye exam: Moderate non-proliferative diabetic retinopathy.     (12/26/2007)   Eye exam due: 12/2008     Foot exam: yes  (10/11/2009)   Foot exam action/deferral: Do today   High risk foot: No  (10/11/2009)   Foot care education: Done  (10/11/2009)   Foot exam due: 12/29/2008    Urine microalbumin/creatinine ratio: 6.5  (03/12/2008)   Urine microalbumin action/deferral: Ordered    Diabetes flowsheet reviewed?: Yes   Progress toward A1C goal: Deteriorated  Lipids   Total Cholesterol: 232  (01/02/2008)   LDL: 158  (01/02/2008)   LDL Direct: Not documented   HDL: 32  (01/02/2008)   Triglycerides: 212  (01/02/2008)  SGOT (AST): 16  (05/16/2007)   SGPT (ALT): 17  (05/16/2007) CMP ordered    Alkaline phosphatase: 106  (05/16/2007)   Total bilirubin: 0.3  (05/16/2007)    Lipid flowsheet reviewed?: Yes   Progress toward LDL goal: Unchanged  Hypertension   Last Blood Pressure: 146 / 85  (10/11/2009)   Serum creatinine: 0.76  (01/02/2008)   Serum potassium 4.4  (01/02/2008) CMP ordered     Hypertension flowsheet reviewed?: Yes   Progress toward BP goal: Deteriorated  Self-Management Support :   Personal Goals (by the next clinic visit) :     Personal A1C goal: 7  (10/11/2009)     Personal blood pressure goal: 130/80  (10/11/2009)     Personal LDL goal: 100  (10/11/2009)    Patient will work on the following items until the next clinic visit to reach self-care goals:     Medications and monitoring: examine my feet every day  (10/11/2009)     Eating: drink diet soda or water instead of juice or soda, eat more vegetables, use fresh or frozen vegetables, eat foods that are low in salt, eat baked foods instead of fried foods, eat fruit for snacks and desserts, limit or avoid alcohol  (10/11/2009)     Activity: take a 30 minute walk every day  (10/11/2009)    Diabetes self-management support: Resources for patients handout  (10/11/2009)   Referred for diabetes self-mgmt training.   Last medical nutrition therapy: 05/22/2007    Hypertension self-management support: Resources for  patients handout  (10/11/2009)    Lipid self-management support: Resources for patients handout  (10/11/2009)        Resource handout printed.   Nursing Instructions: Diabetic foot exam today Schedule screening mammogram (see order)    Laboratory Results   Blood Tests   Date/Time Received: October 11, 2009 3:53 PM  Date/Time Reported: Burke Keels  October 11, 2009 3:53 PM   HGBA1C: 12.3%   (Normal Range: Non-Diabetic - 3-6%   Control Diabetic - 6-8%) CBG Random:: 462mg /dL      Last LDL:                                                 158 (01/02/2008 9:02:00 PM)        Diabetic Foot Exam Last Podiatry Exam Date: 03/04/2008 Foot Inspection Is there a history of a foot ulcer?              No Is there a foot ulcer now?              No Can the patient see the bottom of their feet?          Yes Are the shoes appropriate in style and fit?          Yes Is there swelling or an abnormal foot shape?          No Are the toenails long?                No Are the toenails thick?                No Are the toenails ingrown?              No Is there heavy callous build-up?  No Is there a claw toe deformity?                          No Is there elevated skin temperature?            No Is there limited ankle dorsiflexion?            No Is there foot or ankle muscle weakness?            No Do you have pain in calf while walking?           No      Diabetic Foot Care Education :Patient educated on appropriate care of diabetic feet.  Pulse Check          Right Foot          Left Foot Posterior Tibial:        normal            normal Dorsalis Pedis:        normal            normal  High Risk Feet? No   10-g (5.07) Semmes-Weinstein Monofilament Test Performed by: Theotis Barrio NTII          Right Foot          Left Foot Visual Inspection     normal           normal Test Control      normal         normal Site 1         normal         normal Site 2          normal         normal Site 3         normal         normal Site 4         normal         normal Site 5         normal         normal Site 6         normal         normal Site 7         normal         normal Site 8         normal         normal Site 9         normal         normal Site 10         normal         normal  Impression      normal         normal  Legend:  Site 1 = Plantar aspect of first toe (center of pad) Site 2 = Plantar aspect of third toe (center of pad) Site 3 = Plantar aspect of fifth toe (center of pad) Site 4 = Plantar aspect of first metatarsal head Site 5 = Plantar aspect of third metatarsal head Site 6 = Plantar aspect of fifth metatarsal head Site 7 = Plantar aspect of medial midfoot Site 8 = Plantar aspect of lateral midfoot Site 9 = Plantar aspect of heel Site 10 = dorsal aspect of foot between the base of the first and second toes   Result is Abnormal if patient was unable to perceive the monofilament at site  indicated.     Immunizations Administered:  PPD Skin Test:    Vaccine Type: PPD    Site: left forearm    Mfr: Sanofi Pasteur    Dose: 0.1 ml    Route: ID    Given by: Angelina Ok RN    Exp. Date: 11/24/2010    Lot #: [id number]  PPD Results    Date of reading: 10/13/2009    Results: < 5mm    Interpretation: negative Chinita Pester RN  October 13, 2009 9:52 AM

## 2010-02-21 NOTE — Assessment & Plan Note (Signed)
Summary: fever/follow up/gg   Vital Signs:  Patient Profile:   45 Years Old Female Height:     60.5 inches (153.67 cm) Weight:      212.8 pounds (96.73 kg) BMI:     41.02 Temp:     100.00 degrees F (37.78 degrees C) oral Pulse rate:   102 / minute BP sitting:   154 / 100  (right arm)  Vitals Entered By: Filomena Jungling NT II (November 24, 2007 2:18 PM)             Is Patient Diabetic? Yes Did you bring your meter with you today? No Nutritional Status BMI of 25 - 29 = overweight  Have you ever been in a relationship where you felt threatened, hurt or afraid?No   Does patient need assistance? Functional Status Self care Ambulation Normal     PCP:  Manning Charity MD   History of Present Illness: This is a  45  y/o woman with PMH of  Asthma Diabetes mellitus, type II HSV Migraine headaches    - normal head CT scan 08/06/06 Carpal tunnel syndrome last pap 2006, no hx abnormals. presenting with URI spx. She was to ED for fever/chills/flu-like spx. She did not get Tamiflu then. She came 4 days ago and was given Robitussin and Tylenol. She feels much better now but still has a HA, sore throat and has right ear pain. No SOB. Has CP only when she coughs. Had nausea on her way here.  Her boy also had a fever 4 days ago but feeling better now. He is out of school for the last several days.      Current Allergies: No known allergies     Risk Factors: Tobacco use:  never  PAP Smear History:    Date of Last PAP Smear:  03/27/2004   Review of Systems       per HPI   Physical Exam  General:     alert and overweight-appearing.  Coughing paroxistically. Ears:     bilat. clear TMs Mouth:     pharynx pink and moist.  Tonsillomegaly, mild erythema on right tonsil. Lungs:     normal respiratory effort, interrupted by cough.  normal respiratory effort, no crackles, and no wheezes.   Heart:     regular rhythm, no murmur, no gallop, and tachycardia.   Abdomen:     Bowel  sounds positive,abdomen soft and NT    Impression & Recommendations:  Problem # 1:  URI (ICD-465.9) Pt has a resolving URI. She is still low febrile. She still coughs and feels tired and weak. However, she feels much better she says.  I advised her to rest, drink plenty of water or Zero (not Gatorade since diabetic) and to take Ibuprofen for mm aches and congestion. She will also have Tussionex as needed for cough and Cepacol for sore throat. I wanted to give her smthg like Pseudoeffedrine (Sudafed), but I cannot afford to put her on this with sucha  high BP. I gave her a note to stay out of work. She knows she should be out until 48 hour without fever.  Her updated medication list for this problem includes:    Aspirin 81 Mg Chew (Aspirin) .Marland Kitchen... Take 1 tablet by mouth once a day    Tussionex Pennkinetic Er 8-10 Mg/51ml Lqcr (Chlorpheniramine-hydrocodone) .Marland Kitchen... Drink 5 ml two times a day as needed for cough    Ibuprofen 800 Mg Tabs (Ibuprofen) .Marland Kitchen... Three times a day as  needed for muscle aches, fever, congestion   Problem # 2:  ESSENTIAL HYPERTENSION (ICD-401.9) BP high, but I would expect this with her coughing fits. Will need to recheck when feeling better. Her updated medication list for this problem includes:    Lisinopril 10 Mg Tabs (Lisinopril) .Marland Kitchen... Take one tablet by mouth daily.  BP today: 154/100 Prior BP: 130/77 (11/20/2007)  Labs Reviewed: Creat: 0.70 (05/16/2007) Chol: 215 (05/16/2007)   HDL: 36 (05/16/2007)   LDL: 152 (05/16/2007)   TG: 136 (05/16/2007)   Complete Medication List: 1)  Glipizide 10 Mg Tabs (Glipizide) .... Take 1 tablet by mouth twice a day 2)  Glucophage 1000 Mg Tabs (Metformin hcl) .... Take 1 tablet by mouth two times a day 3)  Albuterol 90 Mcg/act Aers (Albuterol) .... As needed 4)  Lisinopril 10 Mg Tabs (Lisinopril) .... Take one tablet by mouth daily. 5)  Aspirin 81 Mg Chew (Aspirin) .... Take 1 tablet by mouth once a day 6)  Imipramine Hcl 50 Mg  Tabs (Imipramine hcl) .... Take 1 tablet by mouth at bedtime 7)  Pravachol 20 Mg Tabs (Pravastatin sodium) .... Take 1 tablet by mouth once a day 8)  Relion Ultima Test Strp (Glucose blood) .... To test blood glucose twice daily 9)  Blood Glucose Meter Kit (Blood glucose monitoring suppl) .... [employer] relion ultima meter to go with test strips 10)  Lancets Misc (Lancets) .... To test blood glucose twice daily 11)  Tussionex Pennkinetic Er 8-10 Mg/94ml Lqcr (Chlorpheniramine-hydrocodone) .... Drink 5 ml two times a day as needed for cough 12)  Chloraseptic 1.4 % Liqd (Phenol) .... Spray as instructed on the bottle 13)  Ibuprofen 800 Mg Tabs (Ibuprofen) .... Three times a day as needed for muscle aches, fever, congestion   Patient Instructions: 1)  Follow up with your primary care physician as needed. 2)  Can use "Zero" instead of Gatorade to stay hydrated.   Prescriptions: IBUPROFEN 800 MG TABS (IBUPROFEN) three times a day as needed for muscle aches, fever, congestion  #30 x 0   Entered and Authorized by:   Carlus Pavlov MD   Signed by:   Carlus Pavlov MD on 11/24/2007   Method used:   Print then Give to Patient   RxID:   [id number] CHLORASEPTIC 1.4 % LIQD (PHENOL) spray as instructed on the bottle  #1 x 0   Entered and Authorized by:   Carlus Pavlov MD   Signed by:   Carlus Pavlov MD on 11/24/2007   Method used:   Print then Give to Patient   RxID:   [id number] TUSSIONEX PENNKINETIC ER 8-10 MG/5ML LQCR (CHLORPHENIRAMINE-HYDROCODONE) drink 5 ml two times a day as needed for cough  #30 x 1   Entered and Authorized by:   Carlus Pavlov MD   Signed by:   Carlus Pavlov MD on 11/24/2007   Method used:   Print then Give to Patient   RxID:   [id number]  ]  Vital Signs:  Patient Profile:   45 Years Old Female Height:     60.5 inches (153.67 cm) Weight:      212.8 pounds (96.73 kg) BMI:     41.02 Temp:     100.00 degrees F (37.78 degrees C) oral Pulse  rate:   102 / minute BP sitting:   154 / 100             Last PAP Result Normal PapHx  Normal (03/27/2004 11:26:39 AM)

## 2010-02-21 NOTE — Progress Notes (Signed)
Summary: diabetes f/up/support/dmr  Phone Note Outgoing Call   Call placed by: Jamison Neighbor,  April 14, 2007 3:03 PM Summary of Call: diabetes follow-up - has not been in since 11/2006 with A1C of 10.0%.also no eye exam on file. Unable to leave message. Initial call taken by: Jamison Neighbor,  April 14, 2007 3:03 PM  Follow-up for Phone Call        Tried calling patient again- phone rings, but no answer or machine to leave message.  Follow-up by: Jamison Neighbor,  April 18, 2007 1:39 PM  Additional Follow-up for Phone Call Additional follow up Details #1::        Can we send her a letter asking her to make an appointment if she still wishes to receive care here? Additional Follow-up by: Manning Charity MD,  April 21, 2007 9:53 AM    Additional Follow-up for Phone Call Additional follow up Details #2::    sent letter asking pateint to call to make an appointment if she wishes to continue to receive her care here. Follow-up by: Jamison Neighbor,  April 21, 2007 5:13 PM

## 2010-02-21 NOTE — Letter (Signed)
Summary: [employer]: Disability Claim  [employer]: Disability Claim   Imported By: Florinda Marker 01/12/2008 15:53:37  _____________________________________________________________________  External Attachment:    Type:   Image     Comment:   External Document

## 2010-02-21 NOTE — Consult Note (Signed)
Summary: [facility]   Imported By: Florinda Marker 12/29/2007 16:26:57  _____________________________________________________________________  External Attachment:    Type:   Image     Comment:   External Document  Appended Document: [facility]    Clinical Lists Changes  Observations: Added new observation of DMEYEEXAMNXT: 12/2008 (01/02/2008 14:38)       Procedures Next Due Date:    Diabetic Eye Exam: 12/2008  Appended Document: [facility]   Diabetic Eye Exam  Procedure date:  12/26/2007  Findings:      Moderate non-proliferative diabetic retinopathy.     Procedures Next Due Date:    Diabetic Eye Exam: 12/2008   Diabetic Eye Exam  Procedure date:  12/26/2007  Findings:      Moderate non-proliferative diabetic retinopathy.     Procedures Next Due Date:    Diabetic Eye Exam: 12/2008

## 2010-02-21 NOTE — Assessment & Plan Note (Signed)
Summary: EST-CK/FU/MEDS/CFB   Vital Signs:  Patient Profile:   45 Years Old Female Height:     60.5 inches (153.67 cm) Weight:      210.5 pounds (95.68 kg) BMI:     40.58 Temp:     98.8 degrees F (37.11 degrees C) oral Pulse rate:   95 / minute BP sitting:   143 / 94  (right arm)  Pt. in pain?   no  Vitals Entered By: Filomena Jungling NT II (October 27, 2007 11:09 AM)              Is Patient Diabetic? Yes  Nutritional Status BMI of 25 - 29 = overweight CBG Result 346  Have you ever been in a relationship where you felt threatened, hurt or afraid?No   Does patient need assistance? Functional Status Self care Ambulation Normal     PCP:  Manning Charity MD  Chief Complaint:  NEED REFILLS.  History of Present Illness: This is a  year old woman with past medical history of   Asthma Diabetes mellitus, type II HSV Migraine headaches    - normal head CT scan 08/06/06 Carpal tunnel syndrome last pap 2006, no hx abnormals.  She is here today to discuss diabetes managment.  She reports that she has not had any medications in a month because she could not get a refill without an appointment and she could not get an appointment. She has felt tired this month, thirsty, with frequent urination.  She has not been able to manage her diet.      Updated Prior Medication List: Has not taken any medication at all for one month. Current Allergies: No known allergies     Risk Factors: Tobacco use:  never  PAP Smear History:    Date of Last PAP Smear:  03/27/2004   Review of Systems  The patient denies fever, weight loss, weight gain, chest pain, and peripheral edema.     Physical Exam  General:     alert and overweight-appearing.   Head:     normocephalic and atraumatic.   Eyes:     vision grossly intact, pupils equal, pupils round, and pupils reactive to light.   Ears:     R ear normal and L ear normal.   Nose:     no external deformity.   Mouth:     pharynx pink  and moist and fair dentition.   Neck:     supple and no masses.   Lungs:     normal respiratory effort and normal breath sounds.   Heart:     normal rate, regular rhythm, and no murmur.   Abdomen:     soft, non-tender, and normal bowel sounds.   Extremities:     no edema Neurologic:     alert & oriented X3, cranial nerves II-XII intact, and strength normal in all extremities.   Skin:     turgor normal and color normal.  akanthosis nigracans. Cervical Nodes:     no anterior cervical adenopathy and no posterior cervical adenopathy.   Psych:     Oriented X3, memory intact for recent and remote, normally interactive, and good eye contact.      Impression & Recommendations:  Problem # 1:  DIABETES MELLITUS, TYPE II (ICD-250.00) She reports not having any medications for one month.  Her A1C is 11.8.  She refuses insulin, and acknowledges that she runs the risk of renal failure, blindness, amputation etc in the future if  she does not control her DM.  I STRONGLY encouraged her to consider insulin and we discussed this at length.  She was not receptive. 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  She states that she "knows she she needs to do, but she just doesn't do it" "Will refill her pills, do not know what else to do.  She has already spoken to [employer], and does not want to meet with her again.  She will return in 3 mo for A1C recheck.   Her updated medication list for this problem includes:    Glipizide 10 Mg Tabs (Glipizide) .Marland Kitchen... Take 1 tablet by mouth twice a day    Glucophage 1000 Mg Tabs (Metformin hcl) .Marland Kitchen... Take 1 tablet by mouth two times a day    Lisinopril 10 Mg Tabs (Lisinopril) .Marland Kitchen... Take one tablet by mouth daily.    Aspirin 81 Mg Chew (Aspirin) .Marland Kitchen... Take 1 tablet by mouth  once a day  Orders: T- Capillary Blood Glucose ([address]) T-Hgb A1C (in-house) (60454UJ) Ophthalmology Referral (Ophthalmology)  Future Orders: T-Comprehensive Metabolic Panel [phone]) ... 11/12/2007  Labs Reviewed: HgBA1c: 11.8 (10/27/2007)   Creat: 0.70 (05/16/2007)   Microalbumin: 1.39 (05/16/2007)   Problem # 2:  ESSENTIAL HYPERTENSION (ICD-401.9) No meds for one month.  Will recheck when she comes back.  Refilled lisinopril and encouraged weight loss.   Her updated medication list for this problem includes:    Lisinopril 10 Mg Tabs (Lisinopril) .Marland Kitchen... Take one tablet by mouth daily.  BP today: 143/94 Prior BP: 152/90 (08/20/2007)  Labs Reviewed: Creat: 0.70 (05/16/2007) Chol: 215 (05/16/2007)   HDL: 36 (05/16/2007)   LDL: 152 (05/16/2007)   TG: 136 (05/16/2007)   Problem # 3:  Preventive Health Care (ICD-V70.0) needs a pap, but is menstruating.  will get at next appointment  Problem # 4:  HYPERLIPIDEMIA (ICD-272.4) Refilled pravachol.  She will need CMET at next appt.   Her updated medication list for this problem includes:    Pravachol 20 Mg Tabs (Pravastatin sodium) .Marland Kitchen... Take 1 tablet by mouth once a day  Labs Reviewed: Chol: 215 (05/16/2007)   HDL: 36 (05/16/2007)   LDL: 152 (05/16/2007)   TG: 136 (05/16/2007) SGOT: 16 (05/16/2007)   SGPT: 17 (05/16/2007)   Complete Medication List: 1)  Glipizide 10 Mg Tabs (Glipizide) .... Take 1 tablet by mouth twice a day 2)  Glucophage 1000 Mg Tabs (Metformin hcl) .... Take 1 tablet by mouth two times a day 3)  Albuterol 90 Mcg/act Aers (Albuterol) .... As needed 4)  Lisinopril 10 Mg Tabs (Lisinopril) .... Take one tablet by mouth daily. 5)  Aspirin 81 Mg Chew (Aspirin) .... Take 1 tablet by mouth once a day 6)  Imipramine Hcl 50 Mg Tabs (Imipramine hcl) .... Take 1 tablet by mouth at bedtime 7)  Pravachol 20 Mg Tabs (Pravastatin sodium) .... Take 1 tablet by mouth once a day 8)  Relion Ultima Test Strp (Glucose  blood) .... To test blood glucose twice daily 9)  Blood Glucose Meter Kit (Blood glucose monitoring suppl) .... [facility] relion ultima meter to go with test strips 10)  Lancets Misc (Lancets) .... To test blood glucose twice daily   Patient Instructions: 1)  Please schedule a follow-up appointment in 3 months. 2)  You should take your medictions as prescribed every day! 3)  You need to loose weight to control your dibetes and blood pressure!!  Substitute diet soda for candy.  Continue to eat yogurt, fruits, nuts and salads.     Prescriptions: RELION ULTIMA TEST   STRP (GLUCOSE BLOOD) to test blood glucose twice daily Brand medically necessary #100 x prn   Entered and Authorized by:   Elby Showers MD   Signed by:   Elby Showers MD on 10/27/2007   Method used:   Electronically to        [facility] (retail)       [address]       [place], [place]  [address]       Ph: [phone]       Fax: [phone]   RxID:   [id number] BLOOD GLUCOSE METER   KIT (BLOOD GLUCOSE MONITORING SUPPL) [facility] relion Ultima meter to go with test strips  #1 x prn   Entered and Authorized by:   Elby Showers MD   Signed by:   Elby Showers MD on 10/27/2007   Method used:   Electronically to        [facility] (retail)       [address]       [place], [place]  [address]       Ph: [phone]       Fax: [phone]   RxID:   [id number] LANCETS   MISC (LANCETS) to test blood glucose twice daily  #100 x prn   Entered and Authorized by:   Elby Showers MD   Signed by:   Elby Showers MD on 10/27/2007   Method used:   Electronically to        [facility] (retail)       [address]       [place], [place]  [address]       Ph: [phone]       Fax: [phone]   RxID:   [id number] PRAVACHOL 20 MG  TABS (PRAVASTATIN SODIUM) Take 1 tablet by mouth once a day  #30 x 11   Entered and  Authorized by:   Elby Showers MD   Signed by:   Elby Showers MD on 10/27/2007   Method used:   Electronically to        [facility] (retail)       [address]       [place], [place]  [address]       Ph: [phone]       Fax: [phone]   RxID:   [id number] IMIPRAMINE HCL 50 MG  TABS (IMIPRAMINE HCL) Take 1 tablet by mouth at bedtime  #30 x 5   Entered and Authorized by:   Elby Showers MD   Signed by:   Elby Showers MD on 10/27/2007   Method used:   Electronically to        [facility] (retail)       [address]       [place], [place]  [address]       Ph: [phone]       Fax: [phone]   RxID:   [id number]  ASPIRIN 81 MG CHEW (ASPIRIN) Take 1 tablet by mouth once a day  #30 x prn   Entered and Authorized by:   Elby Showers MD   Signed by:   Elby Showers MD on 10/27/2007   Method used:   Electronically to        [facility] (retail)       [address]       [place], [place]  [address]       Ph: [phone]       Fax: [phone]   RxID:   [id number] LISINOPRIL 10 MG  TABS (LISINOPRIL) Take one tablet by mouth daily.  #30 x 11   Entered and Authorized by:   Elby Showers MD   Signed by:   Elby Showers MD on 10/27/2007   Method used:   Electronically to        [facility] (retail)       [address]       [place], [place]  [address]       Ph: [phone]       Fax: [phone]   RxID:   [id number] GLUCOPHAGE 1000 MG TABS (METFORMIN HCL) Take 1 tablet by mouth two times a day  #62 x 11   Entered and Authorized by:   Elby Showers MD   Signed by:   Elby Showers MD on 10/27/2007   Method used:   Electronically to        [facility] (retail)       [address]       [place], [place]  [address]       Ph: [phone]       Fax: [phone]   RxID:    [id number] GLIPIZIDE 10 MG TABS (GLIPIZIDE) Take 1 tablet by mouth twice a day  #62 x 11   Entered and Authorized by:   Elby Showers MD   Signed by:   Elby Showers MD on 10/27/2007   Method used:   Electronically to        [facility] (retail)       [address]       [place], [place]  [address]       Ph: [phone]       Fax: [phone]   RxID:   [id number]  ] Laboratory Results   Blood Tests   Date/Time Received: October 27, 2007 11:30 AM  Date/Time Reported: Oren Beckmann  October 27, 2007 11:30 AM   HGBA1C: 11.8%   (Normal Range: Non-Diabetic - 3-6%   Control Diabetic - 6-8%) CBG Random:: 346mg /dL

## 2010-02-21 NOTE — Letter (Signed)
Summary: Diabetic Foot Exam  Historic Patient File   Imported By: Margie Billet 11/24/2005 10:15:42  _____________________________________________________________________  External Attachment:    Type:   Image     Comment:   External Document

## 2010-02-21 NOTE — Miscellaneous (Signed)
Summary: MAP request to change to Lipitor and Accupril  Clinical Lists Changes  Medications: Removed medication of LISINOPRIL 10 MG TABS (LISINOPRIL) Take 1 tablet by mouth once a day Removed medication of PRAVASTATIN SODIUM 20 MG TABS (PRAVASTATIN SODIUM) Take 1 tablet by mouth once a night Added new medication of ACCUPRIL 10 MG TABS (QUINAPRIL HCL) Take 1 tablet by mouth once a day - Signed Added new medication of LIPITOR 10 MG TABS (ATORVASTATIN CALCIUM) Take 1/2  tablet by mouth once a night - Signed Rx of ACCUPRIL 10 MG TABS (QUINAPRIL HCL) Take 1 tablet by mouth once a day;  #90 x 1;  Signed;  Entered by: [name] DO;  Authorized by: [name] DO;  Method used: Printed then faxed to [facility] [name] [place], [place], [place]  [address], Ph: [phone], Fax: [phone] Rx of LIPITOR 10 MG TABS (ATORVASTATIN CALCIUM) Take 1/2  tablet by mouth once a night;  #45 x 1;  Signed;  Entered by: [name] DO;  Authorized by: [name] DO;  Method used: Printed then faxed to [facility] [address] [place], [place], [place]  [address], Ph: [phone], Fax: [phone]    Prescriptions: LIPITOR 10 MG TABS (ATORVASTATIN CALCIUM) Take 1/2  tablet by mouth once a night  #45 x 1   Entered and Authorized by:   [name] DO   Signed by:   [name] DO on 11/04/2009   Method used:   Printed then faxed to ...       [facility] [address] (retail)       [address]       [facility]       [place], [place]  [address]       Ph: [phone]       Fax: [phone]   RxID:   [id number] ACCUPRIL 10 MG TABS (QUINAPRIL HCL) Take 1 tablet by mouth once a day  #90 x 1   Entered and Authorized by:   [name] DO   Signed by:   [name] DO on 11/04/2009   Method used:   Printed then faxed to ...       [facility] [address] (retail)       [address]       [facility]       [place], [place]  [address]       Ph: [phone]       Fax: [phone]   RxID:   [id number]

## 2010-02-21 NOTE — Progress Notes (Signed)
Summary: refill/ hla  Phone Note Refill Request Message from:  Fax from Pharmacy on October 22, 2006 8:53 AM  Refills Requested: Medication #1:  LISINOPRIL 20 MG TABS Take 1 tablet by mouth once a day   Last Refilled: 08/19/2006 Initial call taken by: Marin Roberts RN,  October 22, 2006 8:54 AM  Follow-up for Phone Call        Refill approved-nurse to complete Follow-up by: Ulyess Mort MD,  October 22, 2006 9:42 AM      Prescriptions: LISINOPRIL 20 MG TABS (LISINOPRIL) Take 1 tablet by mouth once a day  #31 x 2   Entered and Authorized by:   Ulyess Mort MD   Signed by:   Ulyess Mort MD on 10/22/2006   Method used:   Electronically sent to ...       [facility]       [address]       [place], [place]  [address]       Ph: [phone]       Fax: [phone]   RxID:   [id number]

## 2010-02-21 NOTE — Progress Notes (Signed)
Summary: Acute: eye problems  Phone Note Call from Patient   Caller: Patient Call For: Appt Summary of Call: Received msg from pt wanting to be seen for eye problems. Initial call taken by: Triage Voicemail 07/08/06 9:21am  Follow-up for Phone Call        I called the pt back an she thinks she has pink eye. Her eyes started causing problems last night. She c/o eyes that are itchy, red, and painful. She was given an appt this afternoon at 4pm with Dr. Kathyrn Sheriff. Follow-up by: Henderson Cloud,  July 08, 2006 9:38 AM

## 2010-02-21 NOTE — Progress Notes (Signed)
Summary: Rx refill/dms  Phone Note Refill Request Message from:  Fax from Pharmacy on August 19, 2006 4:50 PM  Refills Requested: Medication #1:  LISINOPRIL 20 MG TABS Take 1 tablet by mouth once a day   Last Refilled: 07/08/2006  Medication #2:  GLIPIZIDE 10 MG TABS Take 1 tablet by mouth twice a day   Last Refilled: 07/08/2006  Medication #3:  GLUCOPHAGE 1000 MG TABS Take 1 tablet by mouth two times a day   Last Refilled: 07/08/2006  Method Requested: electronic Initial call taken by: Henderson Cloud,  August 19, 2006 4:50 PM  Follow-up for Phone Call        Refill completed.  Please schedule appt for pt to see me and Jamison Neighbor Follow-up by: Manning Charity MD,  August 19, 2006 5:12 PM  Additional Follow-up for Phone Call Additional follow up Details #1::        Also need refill for Glucophage please. Requested appts...................................................................Marland KitchenDorene Sorrow RN  August 20, 2006 9:31 AM      Prescriptions: GLUCOPHAGE 1000 MG TABS (METFORMIN HCL) Take 1 tablet by mouth two times a day  #62 x 0   Entered and Authorized by:   Manning Charity MD   Signed by:   Manning Charity MD on 08/20/2006   Method used:   Electronically sent to ...       [facility]       [address]       [place], [place]  [address]       Ph: [phone]       Fax: [phone]   RxID:   [id number] LISINOPRIL 20 MG TABS (LISINOPRIL) Take 1 tablet by mouth once a day  #31 x 0   Entered and Authorized by:   Manning Charity MD   Signed by:   Manning Charity MD on 08/19/2006   Method used:   Electronically sent to ...       [facility] [address]       [address]       [place], [place]  [address]       Ph: [phone]       Fax: [phone]   RxID:   [id number] GLIPIZIDE 10 MG TABS (GLIPIZIDE) Take 1 tablet by mouth twice a day  #62 x 0   Entered and Authorized by:   Manning Charity MD  Signed by:   Manning Charity MD on 08/19/2006   Method used:   Electronically sent to ...       [facility] [address]       [address]       [place], [place]  [address]       Ph: [phone]       Fax: [phone]   RxID:   [id number]

## 2010-02-21 NOTE — Assessment & Plan Note (Signed)
Summary: FU VISIT/DS Sandra [name])   Vital Signs:  Patient Profile:   45 Years Old Female Height:     60.5 inches (153.67 cm) Weight:      207.0 pounds (94.09 kg) BMI:     39.91 Temp:     98.0 degrees F (36.67 degrees C) oral Pulse rate:   107 / minute BP sitting:   112 / 79  (right arm)  Pt. in pain?   no  Vitals Entered By: Geannie Risen RN (July 11, 2006 1:29 PM)              Is Patient Diabetic? Yes  Nutritional Status BMI of > 30 = obese CBG Result 231  Have you ever been in a relationship where you felt threatened, hurt or afraid?No   Does patient need assistance? Functional Status Self care Ambulation Normal   Chief Complaint:  follow up visit.  History of Present Illness: Sandra [name] presents in follow-up for allergic ocular pruritis and restarting her DM and HTN medical therapy after an 8 month hiatus.  Her eye itching improved after just the first dose of pantanol, then entirely after benadryl plus the second dose of pantanol.  She was symptom-free by the next day.    We spoke at length about her diabetes, and about why she neglected treating her DM and HTN.  She was not able to articulate exactly why, but is anxious to get back to taking care of herself. She does not feel she needs to meet with Jamison Neighbor.  Current Allergies (reviewed today): No known allergies      Review of Systems  The patient denies anorexia, chest pain, syncope, peripheral edema, abdominal pain, and melena.         Does c/o diarrhea with metformin   Physical Exam  General:     alert and overweight-appearing.  alert and overweight-appearing.   Eyes:     pupils equal, pupils round, pupils reactive to light, and no injection.  pupils equal, pupils round, pupils reactive to light, and no injection.   Mouth:     Oral mucosa and oropharynx without lesions or exudates.  Teeth in good repair. Lungs:     Normal respiratory effort, chest expands symmetrically. Lungs are clear to auscultation,  no crackles or wheezes. Heart:     Tachycardic, regular rhythm. S1 and S2 normal without gallop, murmur, click, rub or other extra sounds. Abdomen:     Soft, obese, NT, ND, positive BS Extremities:     No clubbing, cyanosis, edema, or deformity noted  Neurologic:     alert & oriented X3 and cranial nerves II-XII intact.  alert & oriented X3 and cranial nerves II-XII intact.   Psych:     Oriented X3 and memory intact for recent and remote.  Oriented X3 and memory intact for recent and remote.      Impression & Recommendations:  Problem # 1:  OCULAR PRURITIS (ICD-698.8) Resolved.  Advised patient that this may recur in the future when her body reacts to allergy-stimulating agents.  When it does happen, she is to take benadryl immediately and call the clinic if it does not resole swiftly.  Problem # 2:  ESSENTIAL HYPERTENSION (ICD-401.9)  The following medications were removed from the medication list:    Hydrochlorothiazide 25 Mg Tabs (Hydrochlorothiazide) .Marland Kitchen... Take one tablet by mouth daily  Her updated medication list for this problem includes:    Lisinopril 20 Mg Tabs (Lisinopril) .Marland Kitchen... Take 1 tablet by mouth once  a day   Problem # 3:  DIABETES MELLITUS, TYPE II (ICD-250.00)  Her updated medication list for this problem includes:    Glipizide 10 Mg Tabs (Glipizide) .Marland Kitchen... Take 1 tablet by mouth twice a day    Glucophage 1000 Mg Tabs (Metformin hcl) .Marland Kitchen... Take 1 tablet by mouth two times a day    Lisinopril 20 Mg Tabs (Lisinopril) .Marland Kitchen... Take 1 tablet by mouth once a day    Aspirin 81 Mg Chew (Aspirin) .Marland Kitchen... Take 1 tablet by mouth once a day  Orders: Capillary Blood Glucose ([address]) Fingerstick ([address])    Patient Instructions: 1)  Please schedule a follow-up appointment in 2 months with Dr. Lyda [name] 2)  It is important that you exercise regularly at least 20 minutes 5 times a week.  3)  Remember--it is YOUR job to take care of your body!!

## 2010-02-23 NOTE — Progress Notes (Signed)
Summary: Refill/gh  Phone Note Refill Request Message from:  Fax from Pharmacy on January 17, 2010 4:26 PM  Refills Requested: Medication #1:  GLIPIZIDE XL 10 MG XR24H-TAB Take 1 tablet by mouth once a day  Medication #2:  LIPITOR 10 MG TABS Take 1/2  tablet by mouth once a night. Last vist was 11/17/2009.  Last labs were 09/2009.   Method Requested: Fax to Local Pharmacy Initial call taken by: Angelina Ok RN,  January 17, 2010 4:27 PM  Follow-up for Phone Call        Refill approved-nurse to complete Follow-up by: Julaine Fusi  DO,  January 17, 2010 9:33 PM    Prescriptions: LIPITOR 10 MG TABS (ATORVASTATIN CALCIUM) Take 1/2  tablet by mouth once a night  #45 x 1   Entered and Authorized by:   Julaine Fusi  DO   Signed by:   Julaine Fusi  DO on 01/17/2010   Method used:   Electronically to        [facility] (retail)       [address]       [place], [place]  [address]       Ph: [phone]       Fax: [phone]   RxID:   [id number] GLIPIZIDE XL 10 MG XR24H-TAB (GLIPIZIDE) Take 1 tablet by mouth once a day  #30 x 5   Entered and Authorized by:   Julaine Fusi  DO   Signed by:   Julaine Fusi  DO on 01/17/2010   Method used:   Electronically to        [facility] (retail)       [address]       [place], [place]  [address]       Ph: [phone]       Fax: [phone]   RxID:   [id number]

## 2010-03-08 ENCOUNTER — Other Ambulatory Visit: Payer: Self-pay | Admitting: *Deleted

## 2010-03-08 MED ORDER — ATORVASTATIN CALCIUM 10 MG PO TABS: ORAL_TABLET | ORAL | Status: DC

## 2010-03-09 NOTE — Telephone Encounter (Signed)
Refill faxed to the [facility] department.

## 2010-04-05 LAB — GLUCOSE, CAPILLARY: Glucose-Capillary: 188 mg/dL — ABNORMAL HIGH (ref 70–99)

## 2010-04-06 LAB — GLUCOSE, CAPILLARY: Glucose-Capillary: 462 mg/dL — ABNORMAL HIGH (ref 70–99)

## 2010-05-02 LAB — URINALYSIS, ROUTINE W REFLEX MICROSCOPIC
Bilirubin Urine: NEGATIVE
Glucose, UA: NEGATIVE mg/dL
Hgb urine dipstick: NEGATIVE
Ketones, ur: NEGATIVE mg/dL
Nitrite: NEGATIVE
Protein, ur: NEGATIVE mg/dL
Specific Gravity, Urine: 1.01 (ref 1.005–1.030)
Urobilinogen, UA: 0.2 mg/dL (ref 0.0–1.0)
pH: 6 (ref 5.0–8.0)

## 2010-05-02 LAB — BASIC METABOLIC PANEL
BUN: 6 mg/dL (ref 6–23)
CO2: 26 mEq/L (ref 19–32)
Calcium: 8.7 mg/dL (ref 8.4–10.5)
Chloride: 103 mEq/L (ref 96–112)
Creatinine, Ser: 0.56 mg/dL (ref 0.4–1.2)
GFR calc Af Amer: >60 mL/min (ref >60)
GFR calc non Af Amer: >60 mL/min (ref >60)
Glucose, Bld: 208 mg/dL — ABNORMAL HIGH (ref 70–99)
Potassium: 3.8 mEq/L (ref 3.5–5.1)
Sodium: 134 mEq/L — ABNORMAL LOW (ref 135–145)

## 2010-05-02 LAB — DIFFERENTIAL
Basophils Absolute: 0 10*3/uL (ref 0.0–0.1)
Basophils Relative: 0 % (ref 0–1)
Eosinophils Absolute: 0 10*3/uL (ref 0.0–0.7)
Eosinophils Relative: 1 % (ref 0–5)
Lymphocytes Relative: 14 % (ref 12–46)
Lymphs Abs: 1.2 10*3/uL (ref 0.7–4.0)
Monocytes Absolute: 0.4 10*3/uL (ref 0.1–1.0)
Monocytes Relative: 5 % (ref 3–12)
Neutro Abs: 6.6 10*3/uL (ref 1.7–7.7)
Neutrophils Relative %: 80 % — ABNORMAL HIGH (ref 43–77)

## 2010-05-02 LAB — GLUCOSE, CAPILLARY: Glucose-Capillary: 193 mg/dL — ABNORMAL HIGH (ref 70–99)

## 2010-05-02 LAB — CBC
HCT: 31.4 % — ABNORMAL LOW (ref 36.0–46.0)
Hemoglobin: 10.7 g/dL — ABNORMAL LOW (ref 12.0–15.0)
MCHC: 34 g/dL (ref 30.0–36.0)
MCV: 96.6 fL (ref 78.0–100.0)
Platelets: 298 10*3/uL (ref 150–400)
RBC: 3.25 MIL/uL — ABNORMAL LOW (ref 3.87–5.11)
RDW: 13.3 % (ref 11.5–15.5)
WBC: 8.2 10*3/uL (ref 4.0–10.5)

## 2010-05-02 LAB — POCT PREGNANCY, URINE: Preg Test, Ur: NEGATIVE

## 2010-05-03 LAB — GLUCOSE, CAPILLARY: Glucose-Capillary: 153 mg/dL — ABNORMAL HIGH (ref 70–99)

## 2010-05-04 LAB — GLUCOSE, CAPILLARY: Glucose-Capillary: 156 mg/dL — ABNORMAL HIGH (ref 70–99)

## 2010-05-05 ENCOUNTER — Other Ambulatory Visit: Payer: Self-pay | Admitting: *Deleted

## 2010-05-05 MED ORDER — QUINAPRIL HCL 10 MG PO TABS: 10.0000 mg | ORAL_TABLET | Freq: Every day | ORAL | Status: DC

## 2010-05-05 NOTE — Telephone Encounter (Signed)
Sandra [name], please call the patient and set her up for a follow-up appt with me within the next month, she has not followed up in almost 6 months, and was previously recommended 1 month follow-up as she was newly started on statin and htn therapy. I will only provide limited prescriptions until she follows up.  Sincerely,  [name], D.O.

## 2010-05-08 ENCOUNTER — Encounter: Payer: Self-pay | Admitting: Internal Medicine

## 2010-05-09 LAB — GLUCOSE, CAPILLARY: Glucose-Capillary: 193 mg/dL — ABNORMAL HIGH (ref 70–99)

## 2010-05-10 ENCOUNTER — Encounter: Payer: Self-pay | Admitting: Internal Medicine

## 2010-06-29 ENCOUNTER — Encounter: Payer: Self-pay | Admitting: Internal Medicine

## 2010-07-30 ENCOUNTER — Encounter: Payer: Self-pay | Admitting: Internal Medicine

## 2010-09-11 ENCOUNTER — Other Ambulatory Visit: Payer: Self-pay | Admitting: *Deleted

## 2010-09-11 MED ORDER — QUINAPRIL HCL 10 MG PO TABS: 10.0000 mg | ORAL_TABLET | Freq: Every day | ORAL | Status: DC

## 2010-09-11 NOTE — Telephone Encounter (Signed)
Patient needs an office visit before this can be filled again. Thank you.

## 2010-09-12 NOTE — Telephone Encounter (Signed)
Pt called and will schedule appointment before she runs out of meds.

## 2010-09-12 NOTE — Telephone Encounter (Signed)
Pt called and message left for her to call clinic about her medication refill.

## 2010-09-12 NOTE — Telephone Encounter (Signed)
This med was NOT called in as pt has scheduled appointment. Per Dr Coralee Pesa.

## 2010-09-27 ENCOUNTER — Encounter: Payer: Self-pay | Admitting: Internal Medicine

## 2010-09-27 ENCOUNTER — Ambulatory Visit (INDEPENDENT_AMBULATORY_CARE_PROVIDER_SITE_OTHER): Payer: Self-pay | Admitting: Internal Medicine

## 2010-09-27 DIAGNOSIS — G43009 Migraine without aura, not intractable, without status migrainosus: Secondary | ICD-10-CM

## 2010-09-27 DIAGNOSIS — Z23 Encounter for immunization: Secondary | ICD-10-CM

## 2010-09-27 DIAGNOSIS — E1139 Type 2 diabetes mellitus with other diabetic ophthalmic complication: Secondary | ICD-10-CM

## 2010-09-27 DIAGNOSIS — E785 Hyperlipidemia, unspecified: Secondary | ICD-10-CM

## 2010-09-27 DIAGNOSIS — Z0000 Encounter for general adult medical examination without abnormal findings: Secondary | ICD-10-CM

## 2010-09-27 DIAGNOSIS — E119 Type 2 diabetes mellitus without complications: Secondary | ICD-10-CM

## 2010-09-27 DIAGNOSIS — I10 Essential (primary) hypertension: Secondary | ICD-10-CM

## 2010-09-27 HISTORY — DX: Encounter for general adult medical examination without abnormal findings: Z00.00

## 2010-09-27 LAB — GLUCOSE, CAPILLARY: Glucose-Capillary: 152 mg/dL — ABNORMAL HIGH (ref 70–99)

## 2010-09-27 LAB — POCT GLYCOSYLATED HEMOGLOBIN (HGB A1C): Hemoglobin A1C: 8.1

## 2010-09-27 MED ORDER — QUINAPRIL HCL 10 MG PO TABS: 10.0000 mg | ORAL_TABLET | Freq: Every day | ORAL | Status: DC

## 2010-09-27 MED ORDER — GLIPIZIDE ER 10 MG PO TB24: 10.0000 mg | ORAL_TABLET | Freq: Two times a day (BID) | ORAL | Status: DC

## 2010-09-27 MED ORDER — METFORMIN HCL 1000 MG PO TABS
1000.0000 mg | ORAL_TABLET | Freq: Two times a day (BID) | ORAL | Status: DC
Start: 2010-09-27 — End: 2011-11-20

## 2010-09-27 MED ORDER — PROMETHAZINE HCL 12.5 MG PO TABS: 25.0000 mg | ORAL_TABLET | Freq: Four times a day (QID) | ORAL | Status: DC | PRN

## 2010-09-27 MED ORDER — IBUPROFEN 200 MG PO TABS: 600.0000 mg | ORAL_TABLET | Freq: Four times a day (QID) | ORAL | Status: AC | PRN

## 2010-09-27 MED ORDER — ATORVASTATIN CALCIUM 10 MG PO TABS: ORAL_TABLET | ORAL | Status: DC

## 2010-09-27 MED ORDER — SUMATRIPTAN SUCCINATE 50 MG PO TABS: 50.0000 mg | ORAL_TABLET | Freq: Once | ORAL | Status: DC | PRN

## 2010-09-27 NOTE — Assessment & Plan Note (Signed)
Hb A1c = 8.1, vastly improved from prior value of 12.3, likely due to diet and exercise modification.  Patient continues to be highly resistant to the idea of starting any injectable treatment for diabetes. -will continue current regimen at present, due to decrease in Hb A1c and patient not willing to try injectables -referred for annual eye exam

## 2010-09-27 NOTE — Assessment & Plan Note (Signed)
BP well-controlled today, and likely higher than patient's baseline given migraine -continue current regimen

## 2010-09-27 NOTE — Assessment & Plan Note (Signed)
Patient's LDL = 146 last year, and patient started on lipitor.  Patient not fasting today -will re-check fasting lipid panel at next visit

## 2010-09-27 NOTE — Assessment & Plan Note (Signed)
Patient's symptoms are consistent with migraine, and similar to her prior migraine symptoms.  She reports previous control of migraines with sumatriptan -patient prescribed sumatriptan, though she states that her insurance may or may not cover this medication -if her insurance does not cover this medication, we have alternatively written for ibuprofen, though this is less ideal -patient also prescribed phenergan

## 2010-09-27 NOTE — Progress Notes (Signed)
HPI The patient is a 45 yo woman with a history of DM, HL, HTN, asthma, and prior migraines, presenting with a migraine headache.  The patient first developed migraines 5-6 years ago, and had about 2-4 migraines per year.  The patient hadn't had a migraine for the past 1 year, but developed a migraine 1 week ago, and another this morning.  Her symptoms presented at 4 a.m. today as left-sided, frontal, throbbing head pain, occasionally radiating to her neck, associated with photophobia, phonophobia, and nausea, with 1 episode of vomiting 1 week ago.  She treated her symptoms with hydrocodone 1 week ago, which resolved the headache within 3 hours, and with a leftover imitrex today, which also resolved the headache within about 3 hours.  After taking the imitrex today, she was able to go to work, but is concerned that her symptoms might recur, and the patient has no current prescription.  The patient's Hb A1c = 8.1 today, which is a vast improvement from 12.3 at her last visit.  She notes that she has been exercising more lately, and trying to control her diet, though that has been difficult lately due to long hours at work.  She has been adamantly opposed to trying insulin in the past, and remains so today.  Her last eye exam was 2 years ago.  She notes no polyuria, polydipsia, alteration of consciouness, or burning/numbness in her extremities.  ROS: General: no fevers, chills, changes in weight, changes in appetite Skin: no rash HEENT: see HPI Pulm: no dyspnea, coughing, wheezing CV: no chest pain, palpitations, shortness of breath Abd: no abdominal pain, nausea/vomiting, diarrhea/constipation GU: no dysuria, hematuria, polyuria Ext: no arthralgias, myalgias Neuro: no weakness, numbness, or tingling  Filed Vitals:   09/27/10 1433  BP: 141/91  Pulse: 92  Temp: 98.1 F (36.7 C)    PEX General: sitting still in a dark room, stating that she is trying to prevent another headache HEENT: pupils  equal round and reactive to light, though discomfort noted upon eliciting pupillary reflex, vision grossly intact, oropharynx clear and non-erythematous  Neck: supple, no lymphadenopathy, no JVD Lungs: clear to ascultation bilaterally, normal work of respiration, no wheezes, rales, ronchi Heart: regular rate and rhythm, no murmurs, gallops, or rubs Abdomen: soft, non-tender, non-distended, normal bowel sounds Msk: no joint edema, warmth, or erythema Extremities: 2+ DP/PT pulses bilaterally, no cyanosis, clubbing, or edema Neurologic: alert & oriented X3, cranial nerves II-XII intact, strength 5/5 throughout, sensation intact to light touch throughout  Assessment/Plan

## 2010-09-27 NOTE — Assessment & Plan Note (Addendum)
-  flu shot given today -patient declined tdap and pneumococcal vaccine at this time

## 2010-09-27 NOTE — Patient Instructions (Signed)
For your migraine, we are prescribing Imitrex.  Take 1 pill as early as possible when you start to get a migraine.  If you do not feel relief, you may take another dose 2 hours later. -we are also prescribing phenergan, which can help with the nausea associated with migraines, and will also help you rest, though it may be sedating.  Take 2 pills (25 mg ) as early as possible in a migraine -if you are unable to afford Imitrex, we have also written a prescription for ibuprofen 600 mg, which can help your symptoms, though is not as effective generally as Imitrex  Your Hemoglobin A1C was lower today than it has been in a while, at 8.1, good job!  This is likely due to your increased exercise, keep up the good work!  Please return for a follow-up visit in 3 months

## 2010-09-28 LAB — CBC WITH DIFFERENTIAL/PLATELET
Basophils Absolute: 0 10*3/uL (ref 0.0–0.1)
Basophils Relative: 0 % (ref 0–1)
Eosinophils Absolute: 0.1 10*3/uL (ref 0.0–0.7)
Eosinophils Relative: 2 % (ref 0–5)
HCT: 34.7 % — ABNORMAL LOW (ref 36.0–46.0)
Hemoglobin: 11.5 g/dL — ABNORMAL LOW (ref 12.0–15.0)
Lymphocytes Relative: 46 % (ref 12–46)
Lymphs Abs: 3 10*3/uL (ref 0.7–4.0)
MCH: 31.5 pg (ref 26.0–34.0)
MCHC: 33.1 g/dL (ref 30.0–36.0)
MCV: 95.1 fL (ref 78.0–100.0)
Monocytes Absolute: 0.6 10*3/uL (ref 0.1–1.0)
Monocytes Relative: 9 % (ref 3–12)
Neutro Abs: 2.9 10*3/uL (ref 1.7–7.7)
Neutrophils Relative %: 44 % (ref 43–77)
Platelets: 332 10*3/uL (ref 150–400)
RBC: 3.65 MIL/uL — ABNORMAL LOW (ref 3.87–5.11)
RDW: 12.4 % (ref 11.5–15.5)
WBC: 6.5 10*3/uL (ref 4.0–10.5)

## 2010-09-28 LAB — BASIC METABOLIC PANEL
BUN: 9 mg/dL (ref 6–23)
CO2: 25 mEq/L (ref 19–32)
Calcium: 9.6 mg/dL (ref 8.4–10.5)
Chloride: 100 mEq/L (ref 96–112)
Creat: 0.65 mg/dL (ref 0.50–1.10)
Glucose, Bld: 135 mg/dL — ABNORMAL HIGH (ref 70–99)
Potassium: 4.3 mEq/L (ref 3.5–5.3)
Sodium: 138 mEq/L (ref 135–145)

## 2010-09-28 NOTE — Progress Notes (Signed)
I agree with Dr. Brown's assessment and plan. 

## 2010-10-03 ENCOUNTER — Other Ambulatory Visit: Payer: Self-pay | Admitting: Internal Medicine

## 2010-10-03 MED ORDER — ELETRIPTAN HYDROBROMIDE 20 MG PO TABS: 20.0000 mg | ORAL_TABLET | ORAL | Status: DC | PRN

## 2010-10-03 NOTE — Progress Notes (Signed)
[facility] pharmacy would not fill Sumatriptan, recommended Eletriptan.  Change was made in the patient's medication profile, and [facility]

## 2010-10-23 ENCOUNTER — Other Ambulatory Visit: Payer: Self-pay | Admitting: *Deleted

## 2010-10-23 DIAGNOSIS — I10 Essential (primary) hypertension: Secondary | ICD-10-CM

## 2010-10-23 LAB — GLUCOSE, CAPILLARY
Glucose-Capillary: 149 — ABNORMAL HIGH
Glucose-Capillary: 217 — ABNORMAL HIGH
Glucose-Capillary: 346 — ABNORMAL HIGH

## 2010-10-23 LAB — URINALYSIS, ROUTINE W REFLEX MICROSCOPIC
Bilirubin Urine: NEGATIVE
Glucose, UA: NEGATIVE
Hgb urine dipstick: NEGATIVE
Ketones, ur: NEGATIVE
Nitrite: NEGATIVE
Protein, ur: NEGATIVE
Specific Gravity, Urine: 1.022
Urobilinogen, UA: 0.2
pH: 6

## 2010-10-23 LAB — URINE MICROSCOPIC-ADD ON

## 2010-10-23 MED ORDER — QUINAPRIL HCL 10 MG PO TABS: 10.0000 mg | ORAL_TABLET | Freq: Every day | ORAL | Status: DC

## 2010-10-23 NOTE — Telephone Encounter (Signed)
Thank you :)

## 2010-10-23 NOTE — Telephone Encounter (Signed)
Please print and fax this to [facility], I pended it. Thank you.

## 2010-10-23 NOTE — Telephone Encounter (Signed)
Refill faxed to the [facility]. 

## 2010-10-26 LAB — GLUCOSE, CAPILLARY: Glucose-Capillary: 342 mg/dL — ABNORMAL HIGH (ref 70–99)

## 2010-11-03 LAB — HEPATIC FUNCTION PANEL
ALT: 17
AST: 20
Albumin: 4
Alkaline Phosphatase: 101
Bilirubin, Direct: <0.1
Total Bilirubin: 0.7
Total Protein: 7.7

## 2010-11-03 LAB — URINE MICROSCOPIC-ADD ON

## 2010-11-03 LAB — CBC
HCT: 37.5
Hemoglobin: 12.8
MCHC: 34.1
MCV: 93.6
Platelets: 393
RBC: 4
RDW: 14
WBC: 11 — ABNORMAL HIGH

## 2010-11-03 LAB — URINALYSIS, ROUTINE W REFLEX MICROSCOPIC
Glucose, UA: NEGATIVE
Hgb urine dipstick: NEGATIVE
Ketones, ur: 15 — AB
Leukocytes, UA: NEGATIVE
Nitrite: NEGATIVE
Protein, ur: 30 — AB
Specific Gravity, Urine: 1.035 — ABNORMAL HIGH
Urobilinogen, UA: 1
pH: 5.5

## 2010-11-03 LAB — LIPASE, BLOOD: Lipase: 17

## 2010-11-03 LAB — DIFFERENTIAL
Basophils Absolute: 0
Basophils Relative: 0
Eosinophils Absolute: 0
Eosinophils Relative: 0
Lymphocytes Relative: 16
Lymphs Abs: 1.8
Monocytes Absolute: 0.7
Monocytes Relative: 6
Neutro Abs: 8.5 — ABNORMAL HIGH
Neutrophils Relative %: 77

## 2010-11-03 LAB — I-STAT 8, (EC8 V) (CONVERTED LAB)
Acid-Base Excess: 2
BUN: 10
Bicarbonate: 26.5 — ABNORMAL HIGH
Chloride: 102
Glucose, Bld: 195 — ABNORMAL HIGH
HCT: 42
Hemoglobin: 14.3
Operator id: 279831
Potassium: 3.7
Sodium: 137
TCO2: 28
pCO2, Ven: 41.4 — ABNORMAL LOW
pH, Ven: 7.414 — ABNORMAL HIGH

## 2010-11-03 LAB — POCT PREGNANCY, URINE
Operator id: 279831
Preg Test, Ur: NEGATIVE

## 2010-11-03 LAB — URIC ACID: Uric Acid, Serum: 6.7

## 2010-12-11 ENCOUNTER — Telehealth: Payer: Self-pay | Admitting: *Deleted

## 2010-12-11 NOTE — Telephone Encounter (Signed)
Pt c/o productive cough, sore throat and body aches.  Cough causing back to hurt. Onset  5 days ago.  Has taken nighttime cough meds and otc cough meds without much relief. Denies fever but is hot and cold.  No n/v   Will see tomorrow

## 2010-12-12 ENCOUNTER — Encounter: Payer: Self-pay | Admitting: Internal Medicine

## 2010-12-12 ENCOUNTER — Ambulatory Visit: Payer: PRIVATE HEALTH INSURANCE | Admitting: Internal Medicine

## 2010-12-12 ENCOUNTER — Ambulatory Visit (INDEPENDENT_AMBULATORY_CARE_PROVIDER_SITE_OTHER): Payer: PRIVATE HEALTH INSURANCE | Admitting: Internal Medicine

## 2010-12-12 VITALS — BP 161/92 | HR 109 | Temp 98.6°F | Ht 60.0 in | Wt 207.5 lb

## 2010-12-12 DIAGNOSIS — J069 Acute upper respiratory infection, unspecified: Secondary | ICD-10-CM

## 2010-12-12 MED ORDER — CHLORPHENIRAMINE-CODEINE 2-10 MG/5ML PO LIQD: 5.0000 mL | Freq: Three times a day (TID) | ORAL | Status: AC

## 2010-12-12 MED ORDER — AMOXICILLIN 500 MG PO CAPS: 500.0000 mg | ORAL_CAPSULE | Freq: Two times a day (BID) | ORAL | Status: AC

## 2010-12-12 NOTE — Patient Instructions (Signed)

## 2010-12-13 ENCOUNTER — Telehealth: Payer: Self-pay | Admitting: *Deleted

## 2010-12-13 DIAGNOSIS — J069 Acute upper respiratory infection, unspecified: Secondary | ICD-10-CM | POA: Insufficient documentation

## 2010-12-13 NOTE — Progress Notes (Signed)
  Subjective:    Patient ID: Sandra [name], female    DOB: 01-11-1966, 45 y.o.   MRN: [id number]  HPI Ms Carollo is a 45 year old female with past medical history as noted in the chart.  She is here today for cough which has been getting worse. The cough started one week ago, associated with clear sputum, persistent, not associated with fever or chills. Patient denies any nausea or vomiting. Patient denies any change in bowel movement. Patient endorses aching pain in her back which is exacerbated by coughing.  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 No recent change in environment, no new pets. Patient does not smoke.    Review of Systems  Constitutional: Negative for fever, activity change and appetite change.  HENT: Negative for sore throat.   Respiratory: Positive for cough. Negative for shortness of breath and wheezing.   Cardiovascular: Negative for chest pain and leg swelling.  Gastrointestinal: Negative for nausea, abdominal pain, diarrhea, constipation and abdominal distention.  Genitourinary: Negative for frequency, hematuria and difficulty urinating.  Musculoskeletal: Positive for back pain.  Neurological: Negative for dizziness and headaches.  Psychiatric/Behavioral: Negative for suicidal ideas and behavioral problems.       Objective:   Physical Exam  Constitutional: She is oriented to person, place, and time. She appears well-developed and well-nourished.  HENT:  Head: Normocephalic and atraumatic.  Eyes: Conjunctivae and EOM are normal. Pupils are equal, round, and reactive to light. No scleral icterus.  Neck: Normal range of motion. Neck supple. No JVD present. No thyromegaly present.  Cardiovascular: Normal rate, regular rhythm, normal heart sounds and intact distal pulses.  Exam reveals no gallop and no friction rub.   No murmur heard. Pulmonary/Chest: Effort normal and breath sounds normal.  No respiratory distress. She has no wheezes. She has no rales.  Abdominal: Soft. Bowel sounds are normal. She exhibits no distension and no mass. There is no tenderness. There is no rebound and no guarding.  Musculoskeletal: Normal range of motion. She exhibits no edema and no tenderness.  Lymphadenopathy:    She has no cervical adenopathy.  Neurological: She is alert and oriented to person, place, and time.  Psychiatric: She has a normal mood and affect. Her behavior is normal.          Assessment & Plan:

## 2010-12-13 NOTE — Telephone Encounter (Signed)
agree

## 2010-12-13 NOTE — Telephone Encounter (Signed)
Called pt given message that it will take at least 48 hours for the antibiotic to work.  Pt will also need to get her cough medication started.  Pt can also use steam as well.  Seaming shower room for example.  Pt to go to Urgent Care or the ER for temps greater than 101 or if the symptoms worsen in 48 hours.  Pt voiced understanding of and will get her cough medication as ordered.  Angelina Ok, RN 12/13/2010 2:00 PM

## 2010-12-13 NOTE — Telephone Encounter (Signed)
Call from pt seen on yesterday for UTI.  Pt says that the pain in her side is continuous at a level 8.  Coughing a lot.  Is non-productive.  Says she gets hot then cold not sure if she has a fever or not.  Has started the antibiotic on yesterday.  Pt was unable to pick up the Cough medication is on the way to pick that up now.  Pt said that she is on the way to pick up the cough medication.  Wants to know what else she can do.  Angelina Ok, RN 12/13/2010 10;45 AM

## 2010-12-13 NOTE — Telephone Encounter (Signed)
Please call her and counsel that it will take 48 hours for antibiotics to start working and she should take cough medicine and other home instructions like steam inhalation as soon as possible for immediate symptomatic relief. If she does not feel better in 2 days of starting antibiotics or has fevers>101 F with worsening cough and breathing difficulty, she should seek help with urgent care or ER.  Thank you

## 2010-12-13 NOTE — Assessment & Plan Note (Signed)
I suspect that patient's cough is most likely started by a viral infection. Given that patient has had cough for about one week now and does not show any sign of improvement, I would start her on antibiotic. I have also advised her to take humidified air to 3 times a day or steam inhalations. Patient was also given codeine cough suppressant and was advised to take over-the-counter pain meds for back pain. I suspect that the back pain is most likely musculoskeletal secondary to chronic cough. Patient was advised to seek help as needed.

## 2011-01-26 ENCOUNTER — Other Ambulatory Visit: Payer: Self-pay | Admitting: *Deleted

## 2011-01-26 MED ORDER — ELETRIPTAN HYDROBROMIDE 20 MG PO TABS: 20.0000 mg | ORAL_TABLET | ORAL | Status: DC | PRN

## 2011-01-29 NOTE — Telephone Encounter (Signed)
Faxed to the [facility].  Angelina Ok, RN 01/29/2011 4:39 PM.

## 2011-02-06 ENCOUNTER — Other Ambulatory Visit: Payer: Self-pay | Admitting: *Deleted

## 2011-02-07 MED ORDER — ELETRIPTAN HYDROBROMIDE 20 MG PO TABS: 20.0000 mg | ORAL_TABLET | ORAL | Status: DC | PRN

## 2011-02-07 NOTE — Telephone Encounter (Signed)
Relpax refilled - rx request faxed to [facility].

## 2011-02-07 NOTE — Telephone Encounter (Signed)
Please call this in for the patient.  Thank you.  [name], D.O.

## 2011-03-19 ENCOUNTER — Telehealth: Payer: Self-pay | Admitting: *Deleted

## 2011-03-19 NOTE — Telephone Encounter (Signed)
I agree. Thank you. [name], D.O.

## 2011-03-19 NOTE — Telephone Encounter (Signed)
Pt called - checked BP today 171/103 pulse 98.  Has appt 03/21/11 2:30PM. States has had h/a since 03/14/11. Pt states she is taking her meds. Talked with Myriam Jacobson - triage nurse to keep appt and can go to Urgent Care for BP - needs to keep appt 03/21/11. Needs to keep record of BP and bring with her at appt time. Stanton Kidney Ferlin Fairhurst RN 03/19/11 2:20PM

## 2011-03-21 ENCOUNTER — Ambulatory Visit (INDEPENDENT_AMBULATORY_CARE_PROVIDER_SITE_OTHER): Payer: PRIVATE HEALTH INSURANCE | Admitting: Internal Medicine

## 2011-03-21 ENCOUNTER — Encounter: Payer: Self-pay | Admitting: Internal Medicine

## 2011-03-21 VITALS — BP 136/78 | HR 100 | Temp 97.5°F | Ht 60.0 in | Wt 211.9 lb

## 2011-03-21 DIAGNOSIS — I10 Essential (primary) hypertension: Secondary | ICD-10-CM

## 2011-03-21 DIAGNOSIS — E785 Hyperlipidemia, unspecified: Secondary | ICD-10-CM

## 2011-03-21 DIAGNOSIS — Z0000 Encounter for general adult medical examination without abnormal findings: Secondary | ICD-10-CM

## 2011-03-21 DIAGNOSIS — E1139 Type 2 diabetes mellitus with other diabetic ophthalmic complication: Secondary | ICD-10-CM

## 2011-03-21 DIAGNOSIS — E11319 Type 2 diabetes mellitus with unspecified diabetic retinopathy without macular edema: Secondary | ICD-10-CM

## 2011-03-21 DIAGNOSIS — Z23 Encounter for immunization: Secondary | ICD-10-CM

## 2011-03-21 DIAGNOSIS — G43909 Migraine, unspecified, not intractable, without status migrainosus: Secondary | ICD-10-CM

## 2011-03-21 DIAGNOSIS — G43009 Migraine without aura, not intractable, without status migrainosus: Secondary | ICD-10-CM

## 2011-03-21 DIAGNOSIS — Z79899 Other long term (current) drug therapy: Secondary | ICD-10-CM

## 2011-03-21 LAB — GLUCOSE, CAPILLARY: Glucose-Capillary: 119 mg/dL — ABNORMAL HIGH (ref 70–99)

## 2011-03-21 LAB — POCT GLYCOSYLATED HEMOGLOBIN (HGB A1C): Hemoglobin A1C: 8.4

## 2011-03-21 MED ORDER — SITAGLIPTIN PHOSPHATE 100 MG PO TABS: 100.0000 mg | ORAL_TABLET | Freq: Every day | ORAL | Status: DC

## 2011-03-21 MED ORDER — PROPRANOLOL HCL 20 MG PO TABS: 20.0000 mg | ORAL_TABLET | Freq: Two times a day (BID) | ORAL | Status: DC

## 2011-03-21 NOTE — Assessment & Plan Note (Signed)
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   Disease Control: not controlled  Progress toward goals: deteriorated  Barriers to meeting goals: no barriers identified   Plan: Glucometer log was not reviewed today, as pt did not have glucometer available for review.      continue current medications + add Januvia - I printed prescription, and she will [facility] to [facility] to see if she can get filled.  reminded to bring blood glucose meter & log to each visit

## 2011-03-21 NOTE — Progress Notes (Signed)
Subjective:    Patient ID: Sandra [name] female   DOB: January 13, 1966 46 y.o.   MRN: [id number]  HPI: [name] is a 46 y.o. with a PMHx of uncontrolled DMII, HTN, HLD, migraine headaches, who presented to clinic today for the following:  1) DM, II - last A1c 8.1 in 09/2010 - Patient checking blood sugars 1 times week, in the daytime. Reports random blood sugars of 120s mg/dL. Currently taking metformin 1000 mg BID and glipizide 10mg  BID. Previously had much poorer DM control with A1c of 12.2 in 10/2009, for which she was recommended insulin and refused all injectable. Patient misses doses 0 x per week on average.  0 hypoglycemic episodes since last visit. admits to polyuria, polydipsia, nausea. Denies vomiting, abdominal pain.  does not request refills today.  2) HTN - Patient does check blood pressure regularly at home - has recently noted her BPs to be more elevated in the range of 157-200/ 92-121. Currently taking Accupril 10mg . Patient misses doses 2 x per week on average - usually takes her meds at work and sometimes forgets. admits to headaches. Denies vision changes, dizziness, lightheadedness, chest pain, shortness of breath.  does not request refills today.   3) HLD - currently taking Lipitor 5mg  daily. Patient misses doses 2 x per week on average. denies chest pain, difficulty breathing, palpitations, tachycardia, and muscle pains. does not request refills today. Has eaten.  4) Preventative care - due for eye exam, tetanus shot, PCV, PAP smear. She is agreeable to get at least some of these preventative care exams done today.  5) Headache -  Pt describes throbbing pain, bilateral in the temporal area. Has a headache currently, took ibuprofen, feels a bit better than earlier this morning. For this course, has had intermittently headaches for last 1 week. Migraine HA occuring 2x/ month. Precipitating factors: patient is aware of none. Aggravating factors: bright light and loud noise,  menstrual cycle, alleviating factors: Rx Meds - triptan therapy used it 2 nights ago, slept, and improved. This AM, ibuprofen helped.  denies recent trauma, MVA, falls. Family history: no known family members with significant headaches. Admits to associated congestion, nausea. Denies associated cough, sore throat, facial pain, fevers, chills, stiff neck. Not currently on menstrual cycle.    Review of Systems: Per HPI.  Current Outpatient Medications: Medication Sig  . atorvastatin (LIPITOR) 10 MG tablet Take 1/2 tablet by mouth once a night.  . eletriptan (RELPAX) 20 MG tablet One tablet by mouth as needed for migraine headache.  If the headache improves and then returns, dose may be repeated after 2 hours have elapsed since first dose (do not exceed 80 mg per day).  Marland Kitchen glipiZIDE (GLUCOTROL XL) 10 MG 24 hr tablet Take 1 tablet (10 mg total) by mouth 2 (two) times daily.  Marland Kitchen ibuprofen (ADVIL) 200 MG tablet Take 3 tablets (600 mg total) by mouth every 6 (six) hours as needed for pain (for migraine).  . metFORMIN (GLUCOPHAGE) 1000 MG tablet Take 1 tablet (1,000 mg total) by mouth 2 (two) times daily with a meal.  . quinapril (ACCUPRIL) 10 MG tablet Take 1 tablet (10 mg total) by mouth at bedtime.    Allergies: No Known Allergies  Past Medical History  Diagnosis Date  . Asthma   . Diabetes mellitus type 2, uncontrolled DX: 1999    Started as gestational diabetes  . HSV (herpes simplex virus) anogenital infection   . Migraine     Normal CT head (  08/06/2006)  . Carpal tunnel syndrome     Past Surgical History  Procedure Date  . Cholecystectomy 2009     Objective:    Physical Exam: Filed Vitals:   03/21/11 1413  BP: 136/78  Pulse: 62  Temp: 97.5 F (36.4 C)      General: Vital signs reviewed and noted. Well-developed, well-nourished, in no acute distress; alert, appropriate and cooperative throughout examination.  Head: Normocephalic, atraumatic.  Eyes: conjunctivae/corneas  clear. PERRL.  Ears: TM nonerythematous, not bulging, good light reflex bilaterally.  Nose: Mucous membranes moist, not inflammed, nonerythematous.  Throat: Oropharynx nonerythematous, no exudate appreciated.   Neck: No deformities, masses, or tenderness noted.  Lungs:  Normal respiratory effort. Clear to auscultation BL without crackles or wheezes.  Heart: RRR. S1 and S2 normal without gallop, rubs. (+) murmur.  Abdomen:  BS normoactive. Soft, Nondistended, non-tender.  No masses or organomegaly.  Extremities: No pretibial edema.  Neurologic: A&O X3, CN II - XII are grossly intact. Motor strength is 5/5 in the all 4 extremities, Sensations intact to light touch.      Assessment/ Plan:   Case and plan of care discussed with Dr. Blanch Media.   Pt is forgetting to take meds at night, if she has run out of medications in her medication pack at work she forgets to take when she gets home. I asked her to set an alarm on her phone to help as a reminder. She agrees and thinks this will work.

## 2011-03-21 NOTE — Assessment & Plan Note (Signed)
Health Maintenance  Topic Date Due  . Ophthalmology Exam  12/25/2008  . Lipid Panel  12/12/2010  . Hemoglobin A1c  12/27/2010  . Pap Smear  01/02/2011  . Foot Exam  09/27/2011  . Influenza Vaccine  10/23/2011  . Pneumococcal Polysaccharide Vaccine (#1) 12/08/1968  . Tetanus/tdap  12/08/1985    Assessment:  Due for PAP, eye exam, PCV, tetanus shot - has refused in the past.  Plan:  Agreeable to do PCV, tetanus today.  Referral for eye exam  Plan PAP next continuity visit.

## 2011-03-21 NOTE — Patient Instructions (Addendum)
   Please follow-up at the clinic in 3 weeks with me if possible, at which time we will reevaluate your headaches, diabetes - OR, please follow-up in the clinic sooner if needed. We will try to do your PAP at this or the next visit.  There have been changes in your medications:  START propranolol 20mg  twice daily for blood pressure and headache prophylaxis (to avoid them from coming on) - you can continue to use your ibuprofen for pain in between and relpax if very bad.  START Januvia for your diabetes.   If you have been started on new medication(s), and you develop symptoms concerning for allergic reaction, including, but not limited to, throat closing, tongue swelling, rash, please stop the medication immediately and call the clinic at [phone], and go to the ER.  If you are diabetic, please bring your meter to your next visit.  If symptoms worsen, or new symptoms arise, please call the clinic or go to the ER.  Please bring all of your medications in a bag to your next visit.

## 2011-03-21 NOTE — Assessment & Plan Note (Signed)
Liver Function Tests:    Component Value Date/Time   AST 12 10/12/2009 0228   ALT 11 10/12/2009 0228   ALKPHOS 112 10/12/2009 0228   BILITOT 0.3 10/12/2009 0228   PROT 6.7 10/12/2009 0228   ALBUMIN 4.3 10/12/2009 0228    Lipid Panel:     Component Value Date/Time   CHOL 235* 10/12/2009 0228   TRIG 276* 10/12/2009 0228   HDL 34* 10/12/2009 0228   CHOLHDL 6.9 Ratio 10/12/2009 0228   VLDL 55* 10/12/2009 0228   LDLCALC 146* 10/12/2009 0228     Assessment: Status: Missing meds 2x/ week  Disease Control: not controlled  Progress toward goals: unable to assess  Barriers to meeting goals: nonadherence to medications   Plan:  continue current medications  Lipid panel today.  Pt advised advised importance of medication adherence to help to avoid further complications due to uncontrolled chronic disease.

## 2011-03-21 NOTE — Assessment & Plan Note (Signed)
Assessment: Status: Current headache does seem to be similar to migraine headaches in the past. Is getting migraines at least 2x/ month. Currently only on abortive tx of relpax.  Disease Control: not controlled  Barriers to meeting goals: no barriers identified   Plan:      Start propranolol for migraine prophylaxis  Continue PRN ibuprofen and Relpax.  Advised of red flag symptoms to prompt immediate evaluation.

## 2011-03-21 NOTE — Assessment & Plan Note (Signed)
BP Readings from Last 3 Encounters:  03/21/11 136/78  12/12/10 161/92  09/27/10 141/91    Basic Metabolic Panel:    Component Value Date/Time   NA 138 09/27/2010 1602   K 4.3 09/27/2010 1602   CL 100 09/27/2010 1602   CO2 25 09/27/2010 1602   BUN 9 09/27/2010 1602   CREATININE 0.65 09/27/2010 1602   CREATININE 0.71 10/12/2009 0228   GLUCOSE 135* 09/27/2010 1602   CALCIUM 9.6 09/27/2010 1602    Assessment: Status: Recently has had quite elevated blood pressures at home with systolics up to 200 mmHg and diastolic up to 120 mmHg. Today, BP is controlled.   Disease Control: controlled  Progress toward goals: unable to assess  Barriers to meeting goals: no barriers identified   Plan:  Continue lisinopril  Will add propranolol to help for prophylactic med for migraine as well as BP control - start low dose.

## 2011-03-22 LAB — LIPID PANEL
Cholesterol: 112 mg/dL (ref 0–200)
HDL: 31 mg/dL — ABNORMAL LOW (ref >39)
LDL Cholesterol: 58 mg/dL (ref 0–99)
Total CHOL/HDL Ratio: 3.6 Ratio
Triglycerides: 115 mg/dL (ref <150)
VLDL: 23 mg/dL (ref 0–40)

## 2011-03-22 LAB — COMPREHENSIVE METABOLIC PANEL
ALT: 13 U/L (ref 0–35)
AST: 17 U/L (ref 0–37)
Albumin: 4.4 g/dL (ref 3.5–5.2)
Alkaline Phosphatase: 83 U/L (ref 39–117)
BUN: 10 mg/dL (ref 6–23)
CO2: 26 mEq/L (ref 19–32)
Calcium: 9 mg/dL (ref 8.4–10.5)
Chloride: 102 mEq/L (ref 96–112)
Creat: 0.74 mg/dL (ref 0.50–1.10)
Glucose, Bld: 138 mg/dL — ABNORMAL HIGH (ref 70–99)
Potassium: 4.3 mEq/L (ref 3.5–5.3)
Sodium: 136 mEq/L (ref 135–145)
Total Bilirubin: 0.3 mg/dL (ref 0.3–1.2)
Total Protein: 6.5 g/dL (ref 6.0–8.3)

## 2011-03-22 LAB — MICROALBUMIN / CREATININE URINE RATIO
Creatinine, Urine: 200.8 mg/dL
Microalb Creat Ratio: 6.3 mg/g (ref 0.0–30.0)
Microalb, Ur: 1.26 mg/dL (ref 0.00–1.89)

## 2011-03-27 ENCOUNTER — Other Ambulatory Visit: Payer: Self-pay | Admitting: *Deleted

## 2011-03-27 DIAGNOSIS — E785 Hyperlipidemia, unspecified: Secondary | ICD-10-CM

## 2011-03-27 MED ORDER — ATORVASTATIN CALCIUM 10 MG PO TABS: ORAL_TABLET | ORAL | Status: DC

## 2011-03-29 NOTE — Telephone Encounter (Signed)
Phone to pharmacy  

## 2011-04-12 ENCOUNTER — Ambulatory Visit (INDEPENDENT_AMBULATORY_CARE_PROVIDER_SITE_OTHER): Payer: PRIVATE HEALTH INSURANCE | Admitting: Internal Medicine

## 2011-04-12 ENCOUNTER — Encounter: Payer: Self-pay | Admitting: Internal Medicine

## 2011-04-12 VITALS — BP 128/82 | HR 77 | Temp 97.6°F | Ht 60.0 in | Wt 211.1 lb

## 2011-04-12 DIAGNOSIS — E785 Hyperlipidemia, unspecified: Secondary | ICD-10-CM

## 2011-04-12 DIAGNOSIS — Z0000 Encounter for general adult medical examination without abnormal findings: Secondary | ICD-10-CM

## 2011-04-12 DIAGNOSIS — H579 Unspecified disorder of eye and adnexa: Secondary | ICD-10-CM

## 2011-04-12 DIAGNOSIS — E1139 Type 2 diabetes mellitus with other diabetic ophthalmic complication: Secondary | ICD-10-CM

## 2011-04-12 DIAGNOSIS — I10 Essential (primary) hypertension: Secondary | ICD-10-CM

## 2011-04-12 DIAGNOSIS — G43009 Migraine without aura, not intractable, without status migrainosus: Secondary | ICD-10-CM

## 2011-04-12 LAB — GLUCOSE, CAPILLARY: Glucose-Capillary: 91 mg/dL (ref 70–99)

## 2011-04-12 NOTE — Patient Instructions (Signed)
   Please follow-up at the clinic in 3 months, at which time we will reevaluate your diabetes, blood pressure, AND try to do your PAP - OR, please follow-up in the clinic sooner if needed.  There have not been changes in your medications  Please try to get Januvia as soon as possible.   KEEP UP ALL OF YOUR AMAZING AND HARD WORK!!!! YOU ARE DOING SUCH A GREAT JOB!!!  If you are diabetic, please bring your meter to your next visit.  If symptoms worsen, or new symptoms arise, please call the clinic or go to the ER.  Please bring all of your medications in a bag to your next visit.

## 2011-04-12 NOTE — Progress Notes (Signed)
Subjective:    Patient ID: Sandra [name] female   DOB: [birth year] 46 y.o.   MRN: [id number]  HPI: [name] is a 46 y.o. with a PMHx of uncontrolled DMII, HTN, HLD, migraine headaches, who presented to clinic today for the following:  1) DM, II - last A1c 8.1 in 09/2010 - Patient checking blood sugars 1 time week, in the daytime. Reports random blood sugars of 150s mg/dL. Currently taking metformin 1000 mg BID and glipizide 10mg  BID. Was prescribed januvia at last visit, is pending getting from [facility] bc they did not have it it. Has not missed any meds since last visit, and is trying to avoid fried foods. Previously had much poorer DM control with A1c of 12.2 in 10/2009, for which she was recommended insulin and refused all injectable. 0 hypoglycemic episodes since last visit. Denies polyuria, polydipsia, nausea, vomiting, abdominal pain.  does not request refills today.  2) HTN - Patient does not check blood pressure regularly at home. Currently taking Accupril 10mg , Propranolol 20mg  BID (has started since last 1 week - on propranolol for BP and for migraine prophylaxis). Has not missed any meds since last visit, and is trying to avoid fried foods. Frequency headaches has decreased. Denies vision changes, dizziness, lightheadedness, chest pain, shortness of breath.  does not request refills today.   3) HLD - currently taking Lipitor 5mg  daily. denies chest pain, difficulty breathing, palpitations, tachycardia, and muscle pains. does not request refills today.   4) Preventative care - refuses PAP today.  5) Headache -  During last visit, pt described 1 week history of intermittent, bitemporal headaches and with migraines occuring at least 2x/mo. She was started on propranolol for migraine prophylaxis in addition to her triptan. Is doing well with this regimen. HA has resolved and has not recurred. Denies fevers, chills, nausea, vomiting.   Review of Systems: Per HPI.  Current  Outpatient Medications: Medication Sig  . atorvastatin (LIPITOR) 10 MG tablet Take 1/2 tablet by mouth once a night.  . eletriptan (RELPAX) 20 MG tablet One tablet by mouth as needed for migraine headache.  If the headache improves and then returns, dose may be repeated after 2 hours have elapsed since first dose (do not exceed 80 mg per day).  Marland Kitchen glipiZIDE (GLUCOTROL XL) 10 MG 24 hr tablet Take 1 tablet (10 mg total) by mouth 2 (two) times daily.  Marland Kitchen ibuprofen (ADVIL) 200 MG tablet Take 3 tablets (600 mg total) by mouth every 6 (six) hours as needed for pain (for migraine).  . metFORMIN (GLUCOPHAGE) 1000 MG tablet Take 1 tablet (1,000 mg total) by mouth 2 (two) times daily with a meal.  . propranolol (INDERAL) 20 MG tablet Take 1 tablet (20 mg total) by mouth 2 (two) times daily.  . quinapril (ACCUPRIL) 10 MG tablet Take 1 tablet (10 mg total) by mouth at bedtime.  . sitaGLIPtin (JANUVIA) 100 MG tablet Take 1 tablet (100 mg total) by mouth daily.   Allergies: No Known Allergies  Past Medical History  Diagnosis Date  . Asthma   . Diabetes mellitus type 2, uncontrolled DX: 1999    Started as gestational diabetes  . HSV (herpes simplex virus) anogenital infection   . Migraine     Normal CT head (08/06/2006)  . Carpal tunnel syndrome     Past Surgical History  Procedure Date  . Cholecystectomy 2009    Objective:    Physical Exam: Filed Vitals:   04/12/11 1545  BP:  141/87  Pulse: 77  Temp: 97.6 F (36.4 C)    Repeat BP:  128/82  General: Vital signs reviewed and noted. Well-developed, well-nourished, in no acute distress; alert, appropriate and cooperative throughout examination.  Head: Normocephalic, atraumatic.  Eyes: conjunctivae/corneas clear. PERRL.  Nose: Mucous membranes moist, not inflammed, nonerythematous.  Throat: Oropharynx nonerythematous, no exudate appreciated.   Lungs:  Normal respiratory effort. Clear to auscultation BL without crackles or wheezes.  Heart:  RRR. S1 and S2 normal without gallop, rubs. (+) murmur.  Abdomen:  BS normoactive. Soft, Nondistended, non-tender.  No masses or organomegaly.  Extremities: No pretibial edema.    Assessment/ Plan:   Case and plan of care discussed with Dr. Lina Sayre.

## 2011-04-13 ENCOUNTER — Other Ambulatory Visit: Payer: Self-pay | Admitting: *Deleted

## 2011-04-13 DIAGNOSIS — I10 Essential (primary) hypertension: Secondary | ICD-10-CM

## 2011-04-13 MED ORDER — QUINAPRIL HCL 10 MG PO TABS: 10.0000 mg | ORAL_TABLET | Freq: Every day | ORAL | Status: DC

## 2011-04-13 NOTE — Telephone Encounter (Signed)
Rx called to pharmacy

## 2011-04-15 ENCOUNTER — Encounter: Payer: Self-pay | Admitting: Internal Medicine

## 2011-04-15 NOTE — Assessment & Plan Note (Signed)
Pertinent Labs: Liver Function Tests:    Component Value Date/Time   AST 17 03/21/2011 1509   ALT 13 03/21/2011 1509   ALKPHOS 83 03/21/2011 1509   BILITOT 0.3 03/21/2011 1509   PROT 6.5 03/21/2011 1509   ALBUMIN 4.4 03/21/2011 1509    Lipid Panel:     Component Value Date/Time   CHOL 112 03/21/2011 1509   TRIG 115 03/21/2011 1509   HDL 31* 03/21/2011 1509   CHOLHDL 3.6 03/21/2011 1509   VLDL 23 03/21/2011 1509   LDLCALC 58 03/21/2011 1509     Assessment: Status: - LDL at goal < 100.  - Patient is compliant all of the time with prescribed medications.   Disease Control: controlled  Progress toward goals: at goal  Barriers to meeting goals: no barriers identified   Plan:  continue current medications

## 2011-04-15 NOTE — Assessment & Plan Note (Signed)
Health Maintenance  Topic Date Due  . Ophthalmology Exam  12/25/2008  . Pap Smear  01/02/2011  . Hemoglobin A1c  06/18/2011  . Foot Exam  09/27/2011  . Influenza Vaccine  10/23/2011  . Lipid Panel  03/20/2012  . Pneumococcal Polysaccharide Vaccine (#2) 03/20/2016  . Tetanus/tdap  03/20/2021    Assessment:  Due for PAP - refuses today.  Due for eye exam - wants to defer until her insurance changes so she can see who is covered under insurance.  Plan:  Plan PAP next visit.  Patient explained importance of eye exam in diabetes care - she expresses understanding.  Plan to put in an ophtho consult for her once she calls [name] back - she wants to wait until her insurance coverage changes within next 1 month.

## 2011-04-15 NOTE — Assessment & Plan Note (Signed)
Pertinent Labs: Lab Results  Component Value Date   HGBA1C 8.4 03/21/2011   HGBA1C 12.3 10/11/2009   CREATININE 0.74 03/21/2011   CREATININE 0.71 10/12/2009   MICROALBUR 1.26 03/21/2011   MICRALBCREAT 6.3 03/21/2011   CHOL 112 03/21/2011   HDL 31* 03/21/2011   TRIG 115 03/21/2011    Last eye exam and foot exam:    Component Value Date/Time   HMDIABEYEEXA Moderate non-proliferative diabetic rentinopathy 12/26/2007   HMDIABFOOTEX Yes/normal 10/11/2009     Assessment: Status: - Patient has made significant strides towards changing her diet. - [facility]ia has been requested from [facility] - pending fill -  Patient is compliant all of the time with prescribed medications since our discussion during last visit!   Disease Control: not controlled  Progress toward goals: improved  Barriers to meeting goals: no barriers identified, prior occasional noncompliance.   Plan: Glucometer log was not reviewed today, as pt did not have glucometer available for review.      continue current medications  Instructed to get Januvia filled as soon as possible.  Congratulated on efforts towards diet control  reminded to get eye exam

## 2011-04-15 NOTE — Assessment & Plan Note (Signed)
Assessment: Well controlled. Started on Propranolol 02/2011 for migraine prophylaxis with cessation of migraine symptoms!  Plan:      Continue current.

## 2011-04-15 NOTE — Assessment & Plan Note (Signed)
Pertinent Data: BP Readings from Last 3 Encounters:  04/12/11 128/82  03/21/11 136/78  12/12/10 161/92    Basic Metabolic Panel:    Component Value Date/Time   NA 136 03/21/2011 1509   K 4.3 03/21/2011 1509   CL 102 03/21/2011 1509   CO2 26 03/21/2011 1509   BUN 10 03/21/2011 1509   CREATININE 0.74 03/21/2011 1509   CREATININE 0.71 10/12/2009 0228   GLUCOSE 138* 03/21/2011 1509   CALCIUM 9.0 03/21/2011 1509    Assessment: Status: - Patient is compliant all of the time with prescribed medications - from prior noncompliance! - Started on Propranolol in 02/2011 for BP and migraine prophylaxis   Disease Control: controlled  Progress toward goals: improved  Barriers to meeting goals: no barriers identified   Plan:  continue current medications

## 2011-05-09 ENCOUNTER — Other Ambulatory Visit: Payer: Self-pay | Admitting: *Deleted

## 2011-05-09 MED ORDER — ELETRIPTAN HYDROBROMIDE 20 MG PO TABS: 20.0000 mg | ORAL_TABLET | ORAL | Status: DC | PRN

## 2011-05-09 NOTE — Telephone Encounter (Signed)
Relpax refilled - rx request form faxed to [facility].

## 2011-08-01 ENCOUNTER — Other Ambulatory Visit: Payer: Self-pay | Admitting: *Deleted

## 2011-08-01 DIAGNOSIS — E1139 Type 2 diabetes mellitus with other diabetic ophthalmic complication: Secondary | ICD-10-CM

## 2011-08-01 MED ORDER — GLIPIZIDE ER 10 MG PO TB24: 10.0000 mg | ORAL_TABLET | Freq: Two times a day (BID) | ORAL | Status: DC

## 2011-08-01 MED ORDER — SITAGLIPTIN PHOSPHATE 100 MG PO TABS: 100.0000 mg | ORAL_TABLET | Freq: Every day | ORAL | Status: DC

## 2011-08-01 NOTE — Telephone Encounter (Signed)
Refill approved - nurse to complete. 

## 2011-08-02 NOTE — Telephone Encounter (Signed)
Rx faxed in.

## 2011-09-26 ENCOUNTER — Other Ambulatory Visit: Payer: Self-pay | Admitting: *Deleted

## 2011-09-26 DIAGNOSIS — E1139 Type 2 diabetes mellitus with other diabetic ophthalmic complication: Secondary | ICD-10-CM

## 2011-09-26 MED ORDER — SITAGLIPTIN PHOSPHATE 100 MG PO TABS: 100.0000 mg | ORAL_TABLET | Freq: Every day | ORAL | Status: DC

## 2011-09-26 MED ORDER — GLIPIZIDE 10 MG PO TABS: 10.0000 mg | ORAL_TABLET | Freq: Two times a day (BID) | ORAL | Status: DC

## 2011-09-26 NOTE — Telephone Encounter (Signed)
Januvia and Glucotrol rxs refilled - request forms faxed to [facility].

## 2011-10-10 ENCOUNTER — Other Ambulatory Visit: Payer: Self-pay | Admitting: *Deleted

## 2011-10-10 DIAGNOSIS — I10 Essential (primary) hypertension: Secondary | ICD-10-CM

## 2011-10-11 MED ORDER — QUINAPRIL HCL 10 MG PO TABS: 10.0000 mg | ORAL_TABLET | Freq: Every day | ORAL | Status: DC

## 2011-10-11 NOTE — Telephone Encounter (Signed)
Please call in her medication to [facility].  Thank you! [name], D.O., 10/11/2011, 12:08 PM

## 2011-10-11 NOTE — Telephone Encounter (Signed)
Please call pt and inform her that I will not be providing additional refills without her coming in for an appointment.  Thank you! [name], D.O., 10/11/2011, 12:06 PM

## 2011-10-11 NOTE — Telephone Encounter (Signed)
Message sent to front office to schedule pt an appt. 

## 2011-11-05 ENCOUNTER — Encounter: Payer: Self-pay | Admitting: Internal Medicine

## 2011-11-05 ENCOUNTER — Ambulatory Visit (INDEPENDENT_AMBULATORY_CARE_PROVIDER_SITE_OTHER): Payer: PRIVATE HEALTH INSURANCE | Admitting: Internal Medicine

## 2011-11-05 VITALS — BP 150/90 | HR 85 | Temp 97.7°F | Ht 60.0 in | Wt 220.4 lb

## 2011-11-05 DIAGNOSIS — E11319 Type 2 diabetes mellitus with unspecified diabetic retinopathy without macular edema: Secondary | ICD-10-CM

## 2011-11-05 DIAGNOSIS — Z79899 Other long term (current) drug therapy: Secondary | ICD-10-CM

## 2011-11-05 DIAGNOSIS — Z0000 Encounter for general adult medical examination without abnormal findings: Secondary | ICD-10-CM

## 2011-11-05 DIAGNOSIS — E1139 Type 2 diabetes mellitus with other diabetic ophthalmic complication: Secondary | ICD-10-CM

## 2011-11-05 DIAGNOSIS — J069 Acute upper respiratory infection, unspecified: Secondary | ICD-10-CM | POA: Insufficient documentation

## 2011-11-05 DIAGNOSIS — I10 Essential (primary) hypertension: Secondary | ICD-10-CM

## 2011-11-05 LAB — GLUCOSE, CAPILLARY: Glucose-Capillary: 163 mg/dL — ABNORMAL HIGH (ref 70–99)

## 2011-11-05 LAB — POCT GLYCOSYLATED HEMOGLOBIN (HGB A1C): Hemoglobin A1C: 8

## 2011-11-05 NOTE — Assessment & Plan Note (Signed)
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 12/26/2007   HMDIABFOOTEX Yes/normal 10/11/2009    Assessment: Diabetes control: not controlled Progress toward goals: improved Barriers to meeting goals: lack of understanding of disease management  Plan: Diabetes treatment: continue current medications - metformin, januvia & glipizide; Sandra [name] is taking glipizide 5mg  BID - Sandra [name] has room to go up to 10mg  BID but does not check her CBGs regularly; I have asked that Sandra [name] start checking CBGs at least 2 weeks prior to her next appt so we can evaluate if this would be an appropriate change Refer to: none - offered referral to dietician but declined Instruction/counseling given: reminded to bring blood glucose meter & log to each visit, reminded to bring medications to each visit, discussed the need for weight loss and discussed diet

## 2011-11-05 NOTE — Assessment & Plan Note (Signed)
Likely viral - hydrate & rest for 2 weeks, if no improvement RTC

## 2011-11-05 NOTE — Assessment & Plan Note (Addendum)
Lab Results  Component Value Date   NA 136 03/21/2011   K 4.3 03/21/2011   CL 102 03/21/2011   CO2 26 03/21/2011   BUN 10 03/21/2011   CREATININE 0.74 03/21/2011   CREATININE 0.71 10/12/2009    BP Readings from Last 3 Encounters:  11/05/11 150/90  04/12/11 128/82  03/21/11 136/78    Assessment: Hypertension control:  mildly elevated  Progress toward goals:  deteriorated Barriers to meeting goals:  acute illness  Plan: Hypertension treatment:  continue current medications - will monitor BP at next visit, will not change regimen in setting of acute illness CMET today

## 2011-11-05 NOTE — Progress Notes (Signed)
Subjective:   Patient ID: Sandra [name] female   DOB: [birth year] 46 y.o.   MRN: [id number]  HPI: Sandra [name] is a 46 y.o. woman with h/o uncontrolled DMII, HTN, HLD, migraine headaches, who presented to clinic today for She was last seen in clinic on 04/12/11 for routine follow up.  She doesn't know why she is here today, but does  Dry cough with congestion x 2 days, rhinorrhea with clear discharge; no sore throat.  No fever.  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  Started all of a sudden.  Feels fatigued.  No nausea/vomiting/diarrhea.  Occipital HA currently.  Not too bad.    Past Medical History  Diagnosis Date  . Asthma   . Diabetes mellitus type 2, uncontrolled DX: 1999    Started as gestational diabetes  . HSV (herpes simplex virus) anogenital infection   . Migraine     Normal CT head (08/06/2006)  . Carpal tunnel syndrome    Current Outpatient Prescriptions  Medication Sig Dispense Refill  . atorvastatin (LIPITOR) 10 MG tablet Take 1/2 tablet by mouth once a night.  45 tablet  3  . eletriptan (RELPAX) 20 MG tablet One tablet by mouth at onset of headache. May repeat in 2 hours if headache persists or recurs. Repeat only once if needed.  18 tablet  1  . glipiZIDE (GLUCOTROL) 10 MG tablet Take 1 tablet (10 mg total) by mouth 2 (two) times daily before a meal.  180 tablet  1  . metFORMIN (GLUCOPHAGE) 1000 MG tablet Take 1 tablet (1,000 mg total) by mouth 2 (two) times daily with a meal.  60 tablet  11  . propranolol (INDERAL) 20 MG tablet Take 1 tablet (20 mg total) by mouth 2 (two) times daily.  60 tablet  5  . quinapril (ACCUPRIL) 10 MG tablet Take 1 tablet (10 mg total) by mouth at bedtime.  90 tablet  0  . sitaGLIPtin (JANUVIA) 100 MG tablet Take 1 tablet (100 mg total) by mouth daily.  90 tablet  1   No family history on file.  History   Social History  . Marital Status: Married    Spouse Name: N/A    Number of Children: N/A  . Years  of Education: N/A   Occupational History  . Daycare teacher    Social History Main Topics  . Smoking status: Never Smoker   . Smokeless tobacco: Never Used  . Alcohol Use: No  . Drug Use: No  . Sexually Active: Not on file   Other Topics Concern  . Not on file   Social History Narrative   Studying at [employer] in early childhood education.Married.   Review of Systems: Constitutional: Denies fever, chills, diaphoresis, appetite change  HEENT: Denies photophobia, eye pain, redness, hearing loss, ear pain, congestion, sore throat, rhinorrhea, sneezing, mouth sores, trouble swallowing, neck pain, neck stiffness and tinnitus.   Respiratory: Denies SOB, DOE, cough, chest tightness,  and wheezing.   Cardiovascular: Denies chest pain, palpitations and leg swelling.  Gastrointestinal: Denies nausea, vomiting, abdominal pain, diarrhea, constipation, blood in stool and abdominal distention.  Genitourinary: Denies dysuria, urgency, frequency, hematuria, flank pain and difficulty urinating.  Musculoskeletal:left forearm pain/swelling x 2 weeks Skin: Denies pallor, rash and wound.  Neurological: Denies dizziness, seizures, syncope, weakness, light-headedness, numbness and headaches.   Objective:  Physical Exam: Filed Vitals:   11/05/11 1351  BP: 150/90  Pulse: 85  Temp: 97.7 F (36.5 C)  TempSrc: Oral  Height: 5' (1.524 m)  Weight: 220 lb 6.4 oz (99.973 kg)  SpO2: 99%   Constitutional: Vital signs reviewed.  Patient is a well-developed and well-nourished woman in no acute distress and cooperative with exam. Head: Normocephalic and atraumatic Ear: TM normal bilaterally Mouth: no erythema or exudates, MMM Eyes: PERRL, EOMI, conjunctivae normal, No scleral icterus.  Neck: Supple, Trachea midline normal ROM, No JVD, mass, thyromegaly, or carotid bruit present.  Cardiovascular: RRR, S1 normal, S2 normal, no MRG, pulses symmetric and intact bilaterally Pulmonary/Chest: CTAB, no wheezes,  rales, or rhonchi Abdominal: Soft. Non-tender, non-distended, bowel sounds are normal, no masses, organomegaly, or guarding present.  GU: no CVA tenderness Musculoskeletal: No joint deformities, erythema, or stiffness, ROM full and no nontender Neurological: A&O x3, Strength is normal and symmetric bilaterally, cranial nerve II-XII are grossly intact, no focal motor deficit, sensory intact to light touch bilaterally.  Skin: Warm, dry and intact. No rash, cyanosis, or clubbing.  Psychiatric: Normal mood and affect. speech and behavior is normal. Judgment and thought content normal. Cognition and memory are normal.   Assessment & Plan:  Case and care discussed with Dr. Criselda Peaches Please see problem oriented charting for further details. Patient to return in 3 months for routine follow up with PCP.

## 2011-11-05 NOTE — Patient Instructions (Addendum)
-Continue ibuprofen and an ACE bandage for support for another 1-2 weeks.  If you feel like your forearm pain is not improving, or getting worse, please call the clinic.  -Currently, you likely have a viral upper respiratory tract infection.  If you are no better in approximately 2 weeks, call the clinic.  -Your sugars seem to be improving, but there is still more room for improvement.  Try to get some more exercise and improve your diet.  Please try to check your blood sugar once a day - if not for the next 3 months, than at least for 2 weeks prior to your next apointment  -Please consider having your pap smear done at your next follow up appointment  Please be sure to bring all of your medications with you to every visit.  Should you have any new or worsening symptoms, please be sure to call the clinic at [phone].  Diabetes, Eating Away From Home Sometimes, you might eat in a restaurant or have meals that are prepared by someone else. You can enjoy eating out. However, the portions in restaurants may be much larger than needed. Listed below are some ideas to help you choose foods that will keep your blood glucose (sugar) in better control.  TIPS FOR EATING OUT  Know your meal plan and how many carbohydrate servings you should have at each meal. You may wish to carry a copy of your meal plan in your purse or wallet. Learn the foods included in each food group.  Make a list of restaurants near you that offer healthy choices. Take a copy of the carry-out menus to see what they offer. Then, you can plan what you will order ahead of time.  Become familiar with serving sizes by practicing them at home using measuring cups and spoons. Once you learn to recognize portion sizes, you will be able to correctly estimate the amount of total carbohydrate you are allowed to eat at the restaurant. Ask for a takeout box if the portion is more than you should have. When your food comes, leave the amount you  should have on the plate, and put the rest in the takeout box before you start eating.  Plan ahead if your mealtime will be different from usual. Check with your caregiver to find out how to time meals and medicine if you are taking insulin.  Avoid high-fat foods, such as fried foods, cream sauces, high-fat salad dressings, or any added butter or margarine.  Do not be afraid to ask questions. Ask your server about the portion size, cooking methods, ingredients and if items can be substituted. Restaurants do not list all available items on the menu. You can ask for your main entree to be prepared using skim milk, oil instead of butter or margarine, and without gravy or sauces. Ask your waiter or waitress to serve salad dressings, gravy, sauces, margarine, and sour cream on the side. You can then add the amount your meal plan suggests.  Add more vegetables whenever possible.  Avoid items that are labeled "jumbo," "giant," "deluxe," or "supersized."  You may want to split an entre with someone and order an extra side salad.  Watch for hidden calories in foods like croutons, bacon, or cheese.  Ask your server to take away the bread basket or chips from your table.  Order a dinner salad as an appetizer. You can eat most foods served in a restaurant. Some foods are better choices than others. Breads and Starches  Recommended: All kinds of bread (wheat, rye, white, oatmeal, [place], [place], raisin), hard or soft dinner rolls, frankfurter or hamburger buns, small bagels, small corn or whole-wheat flour tortillas.  Avoid: Frosted or glazed breads, butter rolls, egg or cheese breads, croissants, sweet rolls, pastries, coffee cake, glazed or frosted doughnuts, muffins. Crackers  Recommended: Animal crackers, graham, rye, saltine, oyster, and matzoth crackers. Bread sticks, melba toast, rusks, pretzels, popcorn (without fat), zwieback toast.  Avoid: High-fat snack crackers or chips. Buttered  popcorn. Cereals  Recommended: Hot and cold cereals. Whole grains such as oatmeal or shredded wheat are good choices.  Avoid: Sugar-coated or granola type cereals. Potatoes/Pasta/Rice/Beans  Recommended: Order baked, boiled, or mashed potatoes, rice or noodles without added fat, whole beans. Order gravies, butter, margarine, or sauces on the side so you can control the amount you add.  Avoid: Hash browns or fried potatoes. Potatoes, pasta, or rice prepared with cream or cheese sauce. Potato or pasta salads prepared with large amounts of dressing. Fried beans or fried rice. Vegetables  Recommended: Order steamed, baked, boiled, or stewed vegetables without sauces or extra fat. Ask that sauce be served on the side. If vegetables are not listed on the menu, ask what is available.  Avoid: Vegetables prepared with cream, butter, or cheese sauce. Fried vegetables. Salad Bars  Recommended: Many of the vegetables at a salad bar are considered "free." Use lemon juice, vinegar, or low-calorie salad dressing (fewer than 20 calories per serving) as "free" dressings for your salad. Look for salad bar ingredients that have no added fat or sugar such as tomatoes, lettuce, cucumbers, broccoli, carrots, onions, and mushrooms.  Avoid: Prepared salads with large amounts of dressing, such as coleslaw, caesar salad, macaroni salad, bean salad, or carrot salad. Fruit  Recommended: Eat fresh fruit or fresh fruit salad without added dressing. A salad bar often offers fresh fruit choices, but canned fruit at a restaurant is usually packed in sugar or syrup.  Avoid: Sweetened canned or frozen fruits, plain or sweetened fruit juice. Fruit salads with dressing, sour cream, or sugar added to them. Meat and Meat Substitutes  Recommended: Order broiled, baked, roasted, or grilled meat, poultry, or fish. Trim off all visible fat. Do not eat the skin of poultry. The size stated on the menu is the raw weight. Meat shrinks  by  in cooking (for example, 4 oz raw equals 3 oz cooked meat).  Avoid: Deep-fat fried meat, poultry, or fish. Breaded meats. Eggs  Recommended: Order soft, hard-cooked, poached, or scrambled eggs. Omelets may be okay, depending on what ingredients are added. Egg substitutes are also a good choice.  Avoid: Fried eggs, eggs prepared with cream or cheese sauce. Milk  Recommended: Order low-fat or fat-free milk according to your meal plan. Plain, nonfat yogurt or flavored yogurt with no sugar added may be used as a substitute for milk. Soy milk may also be used.  Avoid: Milk shakes or sweetened milk beverages. Soups and Combination Foods  Recommended: Clear broth or consomm are "free" foods and may be used as an appetizer. Broth-based soups with fat removed count as a starch serving and are preferred over cream soups. Soups made with beans or split peas may be eaten but count as a starch.  Avoid: Fatty soups, soup made with cream, cheese soup. Combination foods prepared with excessive amounts of fat or with cream or cheese sauces. Desserts and Sweets  Recommended: Ask for fresh fruit. Sponge or angel food cake without icing, ice milk, no  sugar added ice cream, sherbet, or frozen yogurt may fit into your meal plan occasionally.  Avoid: Pastries, puddings, pies, cakes with icing, custard, gelatin desserts. Fats and Oils  Recommended: Choose healthy fats such as olive oil, canola oil, or tub margarine, reduced fat or fat-free sour cream, cream cheese, avocado, or nuts.  Avoid: Any fats in excess of your allowed portion. Deep-fried foods or any food with a large amount of fat. Note: Ask for all fats to be served on the side, and limit your portion sizes according to your meal plan. Document Released: 01/08/2005 Document Revised: 04/02/2011 Document Reviewed: 07/29/2008 [facility] Patient Information 2013 [place], [place].

## 2011-11-05 NOTE — Assessment & Plan Note (Signed)
Declined flu shot & pap smear today.

## 2011-11-06 LAB — COMPREHENSIVE METABOLIC PANEL
ALT: 12 U/L (ref 0–35)
AST: 14 U/L (ref 0–37)
Albumin: 4 g/dL (ref 3.5–5.2)
Alkaline Phosphatase: 80 U/L (ref 39–117)
BUN: 11 mg/dL (ref 6–23)
CO2: 25 mEq/L (ref 19–32)
Calcium: 9.5 mg/dL (ref 8.4–10.5)
Chloride: 105 mEq/L (ref 96–112)
Creat: 0.69 mg/dL (ref 0.50–1.10)
Glucose, Bld: 144 mg/dL — ABNORMAL HIGH (ref 70–99)
Potassium: 4.4 mEq/L (ref 3.5–5.3)
Sodium: 138 mEq/L (ref 135–145)
Total Bilirubin: 0.3 mg/dL (ref 0.3–1.2)
Total Protein: 6.5 g/dL (ref 6.0–8.3)

## 2011-11-16 ENCOUNTER — Other Ambulatory Visit: Payer: Self-pay | Admitting: *Deleted

## 2011-11-16 DIAGNOSIS — G43009 Migraine without aura, not intractable, without status migrainosus: Secondary | ICD-10-CM

## 2011-11-16 MED ORDER — ELETRIPTAN HYDROBROMIDE 20 MG PO TABS: 20.0000 mg | ORAL_TABLET | ORAL | Status: DC | PRN

## 2011-11-16 NOTE — Telephone Encounter (Signed)
Relpax refill request form faxed to [facility]

## 2011-11-16 NOTE — Telephone Encounter (Signed)
Please phone in her triptan.Thank you.  Thank you! - [name], D.O., 11/16/2011, 2:54 PM

## 2011-11-20 ENCOUNTER — Encounter: Payer: Self-pay | Admitting: Internal Medicine

## 2011-11-20 ENCOUNTER — Ambulatory Visit (INDEPENDENT_AMBULATORY_CARE_PROVIDER_SITE_OTHER): Payer: Managed Care, Other (non HMO) | Admitting: Internal Medicine

## 2011-11-20 VITALS — BP 161/103 | HR 107 | Temp 99.0°F | Wt 219.1 lb

## 2011-11-20 DIAGNOSIS — J069 Acute upper respiratory infection, unspecified: Secondary | ICD-10-CM

## 2011-11-20 DIAGNOSIS — E1139 Type 2 diabetes mellitus with other diabetic ophthalmic complication: Secondary | ICD-10-CM

## 2011-11-20 LAB — GLUCOSE, CAPILLARY: Glucose-Capillary: 291 mg/dL — ABNORMAL HIGH (ref 70–99)

## 2011-11-20 MED ORDER — METFORMIN HCL 1000 MG PO TABS: 1000.0000 mg | ORAL_TABLET | Freq: Two times a day (BID) | ORAL | Status: DC

## 2011-11-20 MED ORDER — HYDROCOD POLST-CHLORPHEN POLST 10-8 MG/5ML PO LQCR: 5.0000 mL | Freq: Two times a day (BID) | ORAL | Status: DC | PRN

## 2011-11-20 MED ORDER — DOXYCYCLINE HYCLATE 100 MG PO CAPS: 100.0000 mg | ORAL_CAPSULE | Freq: Two times a day (BID) | ORAL | Status: DC

## 2011-11-20 MED ORDER — ALBUTEROL SULFATE HFA 108 (90 BASE) MCG/ACT IN AERS: 2.0000 | INHALATION_SPRAY | Freq: Four times a day (QID) | RESPIRATORY_TRACT | Status: DC | PRN

## 2011-11-20 NOTE — Assessment & Plan Note (Signed)
Symptoms seem to have worsened.  Unclear if bronchitis vs sinusitis.  Will treat with doxy 100 bid x 7 days and tussionex.

## 2011-11-20 NOTE — Patient Instructions (Signed)
-  Please take Doxycycline 100mg  twice daily for 7 days.  -I have also prescribed a tussionex cough syrup to help with sleep/cough  Please be sure to bring all of your medications with you to every visit.  Should you have any new or worsening symptoms, please be sure to call the clinic at [phone].

## 2011-11-20 NOTE — Progress Notes (Signed)
Subjective:   Patient ID: Sandra [name] female   DOB: [birth year] 46 y.o.   MRN: [id number]  HPI: [name] is a 46 y.o. woman with history of DM and childhood asthma who presents for follow up or URI for which she was seen on 11/05/11.  Since that visit, she reports worsening symptoms. +fevers (100.1), worsening mucous producing cough with post tussive vomiting, +rhinorrhea, +sob, h/o childhood asthma, never smoked; diffuse throbbing HA.  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   Past Medical History  Diagnosis Date  . Asthma   . Diabetes mellitus type 2, uncontrolled DX: 1999    Started as gestational diabetes  . HSV (herpes simplex virus) anogenital infection   . Migraine     Normal CT head (08/06/2006)  . Carpal tunnel syndrome    Current Outpatient Prescriptions  Medication Sig Dispense Refill  . atorvastatin (LIPITOR) 10 MG tablet Take 1/2 tablet by mouth once a night.  45 tablet  3  . eletriptan (RELPAX) 20 MG tablet One tablet by mouth at onset of headache. May repeat in 2 hours if headache persists or recurs. Repeat only once if needed.  18 tablet  1  . glipiZIDE (GLUCOTROL) 10 MG tablet Take 1 tablet (10 mg total) by mouth 2 (two) times daily before a meal.  180 tablet  1  . metFORMIN (GLUCOPHAGE) 1000 MG tablet Take 1 tablet (1,000 mg total) by mouth 2 (two) times daily with a meal.  60 tablet  11  . propranolol (INDERAL) 20 MG tablet Take 1 tablet (20 mg total) by mouth 2 (two) times daily.  60 tablet  5  . quinapril (ACCUPRIL) 10 MG tablet Take 1 tablet (10 mg total) by mouth at bedtime.  90 tablet  0  . sitaGLIPtin (JANUVIA) 100 MG tablet Take 1 tablet (100 mg total) by mouth daily.  90 tablet  1   No family history on file. History   Social History  . Marital Status: Married    Spouse Name: N/A    Number of Children: N/A  . Years of Education: N/A   Occupational History  . Daycare teacher    Social History Main Topics  . Smoking status: Never  Smoker   . Smokeless tobacco: Never Used  . Alcohol Use: No  . Drug Use: No  . Sexually Active: None   Other Topics Concern  . None   Social History Narrative   Studying at [employer] in early childhood education.Married.   Review of Systems: HEENT: Denies photophobia, eye pain, redness, hearing loss, ear pain, congestion, sore throat, rhinorrhea, sneezing, mouth sores, trouble swallowing, neck pain, neck stiffness and tinnitus.   Respiratory: as per HPI  Cardiovascular: Denies chest pain, palpitations and leg swelling.  Gastrointestinal: Denies nausea, vomiting, abdominal pain, diarrhea, constipation, blood in stool and abdominal distention.  Genitourinary: Denies dysuria, urgency, frequency, hematuria, flank pain and difficulty urinating.  Musculoskeletal: Denies myalgias, back pain, joint swelling, arthralgias and gait problem.  Skin: Denies pallor, rash and wound.  Neurological: Denies dizziness, seizures, syncope, weakness, light-headedness, numbness and headaches.  Psychiatric/Behavioral: Denies suicidal ideation, mood changes, confusion, nervousness, sleep disturbance and agitation  Objective:  Physical Exam: Filed Vitals:   11/20/11 0942  BP: 161/103  Pulse: 107  Temp: 99 F (37.2 C)  TempSrc: Oral  Weight: 219 lb 1.6 oz (99.383 kg)  SpO2: 97%   Constitutional: Vital signs reviewed.  Patient is an obese woman in no acute distress and cooperative with  exam. Head: tenderness to left frontal sinus Mouth: no erythema or exudates, dry mucous membranes Nose: mildly inflamed nasal turbinates Eyes: PERRL, EOMI, conjunctivae normal, No scleral icterus.  Neck: no cervical LAD Cardiovascular: tachycardic, S1 normal, S2 normal, no MRG, pulses symmetric and intact bilaterally Pulmonary/Chest: CTAB, no wheezes, rales, or rhonchi Abdominal: Soft. Non-tender, non-distended, bowel sounds are normal, no masses, organomegaly, or guarding present.  Neurological: A&O x3 Skin: Warm, dry and  intact. No rash, cyanosis, or clubbing.  Psychiatric: Normal mood and affect. speech and behavior is normal. Judgment and thought content normal. Cognition and memory are normal.   Assessment & Plan:  Case and care discussed with Dr. Criselda Peaches. Please see problem oriented charting for further details. Patient to return if symptoms worsen.

## 2011-11-20 NOTE — Addendum Note (Signed)
Addended by: Belia Heman on: 11/20/2011 11:55 AM   Modules accepted: Orders

## 2011-11-20 NOTE — Addendum Note (Signed)
Addended by: Belia Heman on: 11/20/2011 11:54 AM   Modules accepted: Orders

## 2011-12-25 ENCOUNTER — Encounter: Payer: Self-pay | Admitting: Internal Medicine

## 2012-01-01 ENCOUNTER — Other Ambulatory Visit: Payer: Self-pay | Admitting: *Deleted

## 2012-01-01 DIAGNOSIS — E785 Hyperlipidemia, unspecified: Secondary | ICD-10-CM

## 2012-01-01 MED ORDER — ATORVASTATIN CALCIUM 10 MG PO TABS: ORAL_TABLET | ORAL | Status: DC

## 2012-01-01 NOTE — Telephone Encounter (Signed)
Please call in atorvastatin (note it is 1/2 tab) to health dept.  Thank you! - [name], D.O., 01/01/2012, 2:17 PM

## 2012-01-01 NOTE — Telephone Encounter (Signed)
Rx called in to pharmacy. 

## 2012-01-03 ENCOUNTER — Ambulatory Visit (INDEPENDENT_AMBULATORY_CARE_PROVIDER_SITE_OTHER): Payer: Managed Care, Other (non HMO) | Admitting: Internal Medicine

## 2012-01-03 ENCOUNTER — Other Ambulatory Visit: Payer: Self-pay | Admitting: *Deleted

## 2012-01-03 ENCOUNTER — Encounter: Payer: Self-pay | Admitting: Internal Medicine

## 2012-01-03 VITALS — BP 184/107 | HR 112 | Temp 99.3°F | Ht 60.0 in | Wt 216.0 lb

## 2012-01-03 DIAGNOSIS — J069 Acute upper respiratory infection, unspecified: Secondary | ICD-10-CM

## 2012-01-03 DIAGNOSIS — I10 Essential (primary) hypertension: Secondary | ICD-10-CM

## 2012-01-03 DIAGNOSIS — E1139 Type 2 diabetes mellitus with other diabetic ophthalmic complication: Secondary | ICD-10-CM

## 2012-01-03 DIAGNOSIS — Z0000 Encounter for general adult medical examination without abnormal findings: Secondary | ICD-10-CM

## 2012-01-03 DIAGNOSIS — E11319 Type 2 diabetes mellitus with unspecified diabetic retinopathy without macular edema: Secondary | ICD-10-CM

## 2012-01-03 MED ORDER — ALBUTEROL SULFATE HFA 108 (90 BASE) MCG/ACT IN AERS: 2.0000 | INHALATION_SPRAY | Freq: Four times a day (QID) | RESPIRATORY_TRACT | Status: DC | PRN

## 2012-01-03 MED ORDER — QUINAPRIL HCL 10 MG PO TABS: 10.0000 mg | ORAL_TABLET | Freq: Every day | ORAL | Status: DC

## 2012-01-03 MED ORDER — AZITHROMYCIN 250 MG PO TABS: ORAL_TABLET | ORAL | Status: DC

## 2012-01-03 MED ORDER — BENZONATATE 100 MG PO CAPS: 200.0000 mg | ORAL_CAPSULE | Freq: Three times a day (TID) | ORAL | Status: DC | PRN

## 2012-01-03 NOTE — Assessment & Plan Note (Signed)
Assessment: Patient has recurrent URI. Given that her symptoms have persisted for greater than a week at this point, and are getting progressively worse, I am concerned for bacterial infection. Therefore, I will start her on an additional course of antibiotics.  Plan:      Azithromycin x 5 days.   Prescription for [employer].  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   Will defer flu shot today in the light of acute illness, however, will readdress at followup visit.   Of note, if symptoms persistent longitudinally, may need to evaluate for ACE inhibitor associated cough (this is thought to be much less likely given her other constellation of symptoms).

## 2012-01-03 NOTE — Assessment & Plan Note (Signed)
Pertinent Data: BP Readings from Last 3 Encounters:  01/03/12 184/107  11/20/11 161/103  11/05/11 150/90    Basic Metabolic Panel:    Component Value Date/Time   NA 138 11/05/2011 1450   K 4.4 11/05/2011 1450   CL 105 11/05/2011 1450   CO2 25 11/05/2011 1450   BUN 11 11/05/2011 1450   CREATININE 0.69 11/05/2011 1450   CREATININE 0.71 10/12/2009 0228   GLUCOSE 144* 11/05/2011 1450   CALCIUM 9.5 11/05/2011 1450    Assessment: Disease Control: moderately elevated  Progress toward goals: deteriorated  Barriers to meeting goals: Patient is sick at this time, therefore, I do not feel that this is inaccurate reflection of her blood pressure.    The patient will need to be reevaluated after her acute illness resolves. The patient was informed that her blood pressures remained very high. Therefore, she will likely require escalation of her blood pressure regimen at the next visit. We will arrange for very close followup, such that the blood pressure can be closely monitored..    Patient is compliant most of the time with prescribed medications.   Plan:  continue current medications - plan to bring back in 2 weeks, with likely escalation of quinapril at that time.  Educational resources provided:    Self management tools provided:

## 2012-01-03 NOTE — Progress Notes (Addendum)
Patient: Sandra [name]   MRN: [id number]  DOB: April 08, 1965  PCP: Alexandria Lodge, DO   Subjective:    HPI: Sandra [name] is a 46 y.o. female with a PMHx of uncontrolled type 2 diabetes, retention, hyperlipidemia, obesity, and asthma who presented to clinic today for the following:  1)  Cough - Patient describes a 1 week history of nonproductive cough that is gradually worsening since time of onset. Admits to associated shortness of breath and sore throat, subjective fever and chils (although no overt fever per checking temp) and nasal congestion No ear pain or discharge. No dysphagia or odynophagia. Denies associated fever and hemoptysis. has had exposure to sick contact with similar symptoms. No has not recent exposure to known allergens. Taking mucinex generic for the cough. Has been taking the albuterol prescribed last time,   2) HTN - Patient does not check blood pressure regularly at home. Currently taking quinapril 10 mg and propranolol 20 mg twice a day (also used for her migraine prophylaxis). Patient states that she does not miss her medications, however did not yet take this morning's dose because she was not feeling well.  denies headaches, dizziness, lightheadedness, chest pain, shortness of breath. does not request refills today.  3) DM2, uncontrolled -  Lab Results  Component Value Date   HGBA1C 8.0 11/05/2011   Patient checking blood sugars every few days at random times of day. Currently taking Metformin 1000 BID, Januvia 100 mg, Glipizide 10mg  BID. Patient misses doses 2-3 x per week on average.  0 hypoglycemic episodes since last visit. Denies assisted hypoglycemia or recently hospitalizations for either hyper or hypoglycemia. denies polyuria, polydipsia, nausea, vomiting, diarrhea.  does not request refills today.  In regards to diabetic complications:  Microvascular complications: Confirms: retinopathy ; Denies nephropathy, autonomic neuropathy and peripheral  neuropathy.  Macrovascular complications: Confirms: none ; Denies cardiovascular disease, cerebrovascular disease and peripheral vascular disease.   Important diabetic medications: Is patient on aspirin? No - 10 year cardiovascular risk approximately 1% Is patient on a statin? Yes Is patient on an ACE-I/ ARB? Yes   Review of Systems: Per HPI.   Current Outpatient Medications: Medication Sig  . albuterol (PROVENTIL HFA;VENTOLIN HFA) 108 (90 BASE) MCG/ACT inhaler Inhale 2 puffs into the lungs every 6 (six) hours as needed for wheezing.  Marland Kitchen atorvastatin (LIPITOR) 10 MG tablet Take 1/2 tablet by mouth once a night.  . eletriptan (RELPAX) 20 MG tablet One tablet by mouth at onset of headache. May repeat in 2 hours if headache persists or recurs. Repeat only once if needed.  Marland Kitchen glipiZIDE (GLUCOTROL) 10 MG tablet Take 1 tablet (10 mg total) by mouth 2 (two) times daily before a meal.  . metFORMIN (GLUCOPHAGE) 1000 MG tablet Take 1 tablet (1,000 mg total) by mouth 2 (two) times daily with a meal.  . propranolol (INDERAL) 20 MG tablet Take 1 tablet (20 mg total) by mouth 2 (two) times daily.  . quinapril (ACCUPRIL) 10 MG tablet Take 1 tablet (10 mg total) by mouth at bedtime.  . sitaGLIPtin (JANUVIA) 100 MG tablet Take 1 tablet (100 mg total) by mouth daily.    Allergies: No Known Allergies   Past Medical History  Diagnosis Date  . Asthma   . Diabetes mellitus type 2, uncontrolled DX: 1999    Started as gestational diabetes  . HSV (herpes simplex virus) anogenital infection   . Migraine     Normal CT head (08/06/2006)  . Carpal tunnel syndrome   .  ESSENTIAL HYPERTENSION 07/08/2006  . HYPERLIPIDEMIA 08/20/2007    Past Surgical History  Procedure Date  . Cholecystectomy 2009     Objective:    Physical Exam: Filed Vitals:   01/03/12 1608  BP: 184/107  Pulse: 112  Temp: 99.3 F (37.4 C)     General: Vital signs reviewed and noted. Well-developed, well-nourished, in no acute  distress; alert, appropriate and cooperative throughout examination.  Head: Normocephalic, atraumatic.  Eyes: conjunctivae/corneas clear. PERRL, EOM's intact.   Ears: TM nonerythematous, not bulging, good light reflex bilaterally.  Nose: Mucous membranes moist, inflamed, nonerythematous.  Throat: Oropharynx nonerythematous, no exudate appreciated.   Neck: No deformities, masses, or tenderness noted.no lymphadenopathy appreciated.   Lungs:  Normal respiratory effort. Clear to auscultation BL without crackles or wheezes.  Heart: Tachycardic but regular rhythm. S1 and S2 normal without gallop, rubs. No murmur.  Abdomen:  BS normoactive. Soft, Nondistended, non-tender.  No masses or organomegaly.  Extremities: No pretibial edema.    Assessment/ Plan:    Case and plan of care discussed with attending physician, Dr. Jonah Blue.

## 2012-01-03 NOTE — Assessment & Plan Note (Addendum)
Pertinent Labs: Lab Results  Component Value Date   HGBA1C 8.0 11/05/2011   HGBA1C 12.3 10/11/2009   CREATININE 0.69 11/05/2011   CREATININE 0.71 10/12/2009   MICROALBUR 1.26 03/21/2011   MICRALBCREAT 6.3 03/21/2011   CHOL 112 03/21/2011   HDL 31* 03/21/2011   TRIG 115 03/21/2011    Assessment: Disease Control: fair control  Progress toward goals: unchanged  Barriers to meeting goals: nonadherence to medications  Microvascular complications: retinopathy  Macrovascular complications: none  On aspirin: No: Her 10 year cardiovascular risk is less than 1% - therefore, aspirin is not indicated at this time.   On statin: Yes  On ACE-I/ ARB: Yes    Patient is compliant most of the time with prescribed medications.   Plan: Glucometer log was not reviewed today, as pt did not have glucometer available for review.   continue current medications  Encouraged improved compliance with her home regimen, with the caveat that she is already on almost maximal oral medications, therefore if she cannot achieve adequate blood sugar control she may end up wiring insulin initiation in the future.  reminded to bring blood glucose meter & log to each visit  Educational resources provided:    Self management tools provided:    Home glucose monitoring recommendation: once a day before breakfast

## 2012-01-03 NOTE — Patient Instructions (Signed)
General Instructions:  Please follow-up at the clinic in 2 weeks, at which time we will reevaluate your cough, blood pressure, diabetes - OR, please follow-up in the clinic sooner if needed.  There have not been changes in your medications:  START Zpak for your respiratory infection  START Tessalon perles as needed for your cough  START Albuterol as needed for Shortness of breath  CONTINUE taking the rest of your blood pressure and diabetes medications - do not skip doses.    If you have been started on new medication(s), and you develop symptoms concerning for allergic reaction, including, but not limited to, throat closing, tongue swelling, rash, please stop the medication immediately and call the clinic at [phone], and go to the ER.  If you are diabetic, please bring your meter to your next visit.  If symptoms worsen, or new symptoms arise, please call the clinic or go to the ER.  PLEASE BRING ALL OF YOUR MEDICATIONS  IN A BAG TO YOUR NEXT APPOINTMENT   Treatment Goals:  Goals (1 Years of Data) as of 01/03/2012          As of Today 11/20/11 11/05/11 04/12/11 04/12/11     Blood Pressure    . Blood Pressure < 140/80  184/107 161/103 150/90 128/82 141/87     Result Component    . HEMOGLOBIN A1C < 7.0    8.0      . LDL CALC < 100          . LDL CALC < 100            Progress Toward Treatment Goals:    Self Care Goals & Plans:  Self Care Goal 01/03/2012  Manage my medications take my medicines as prescribed; refill my medications on time  Monitor my health keep track of my blood glucose  Eat healthy foods eat smaller portions; eat foods that are low in salt; eat baked foods instead of fried foods  Be physically active find time in my schedule; take a walk every day       Care Management & Community Referrals:

## 2012-01-03 NOTE — Assessment & Plan Note (Signed)
Health Maintenance  Topic Date Due  . Ophthalmology Exam  12/25/2008  . Pap Smear  01/02/2011  . Influenza Vaccine  09/23/2011  . Foot Exam  09/27/2011  . Hemoglobin A1c  02/05/2012  . Lipid Panel  03/20/2012  . Pneumococcal Polysaccharide Vaccine (#2) 03/20/2016  . Tetanus/tdap  03/20/2021    Assessment:  Procedures due: Eye exam and Pap smear and foot exam  Labs due: None  Immunizations due: Flu shot  Plan:  Plan to address preventative care measures at the next visit.   Will call the patient to assess if she may be agreeable to ophthalmology referral for diabetic eye exam.

## 2012-01-04 NOTE — Telephone Encounter (Signed)
Rx faxed in.

## 2012-01-07 ENCOUNTER — Encounter (HOSPITAL_COMMUNITY): Payer: Self-pay | Admitting: Emergency Medicine

## 2012-01-07 ENCOUNTER — Emergency Department (HOSPITAL_COMMUNITY): Payer: Managed Care, Other (non HMO)

## 2012-01-07 ENCOUNTER — Emergency Department (HOSPITAL_COMMUNITY)
Admission: EM | Admit: 2012-01-07 | Discharge: 2012-01-07 | Disposition: A | Discharge status: Home / Self Care | Payer: Managed Care, Other (non HMO) | Attending: Emergency Medicine | Admitting: Emergency Medicine

## 2012-01-07 DIAGNOSIS — R059 Cough, unspecified: Secondary | ICD-10-CM | POA: Insufficient documentation

## 2012-01-07 DIAGNOSIS — J45909 Unspecified asthma, uncomplicated: Secondary | ICD-10-CM | POA: Insufficient documentation

## 2012-01-07 DIAGNOSIS — J40 Bronchitis, not specified as acute or chronic: Secondary | ICD-10-CM | POA: Insufficient documentation

## 2012-01-07 DIAGNOSIS — Z8619 Personal history of other infectious and parasitic diseases: Secondary | ICD-10-CM | POA: Insufficient documentation

## 2012-01-07 DIAGNOSIS — Z8679 Personal history of other diseases of the circulatory system: Secondary | ICD-10-CM | POA: Insufficient documentation

## 2012-01-07 DIAGNOSIS — R05 Cough: Secondary | ICD-10-CM | POA: Insufficient documentation

## 2012-01-07 DIAGNOSIS — Z79899 Other long term (current) drug therapy: Secondary | ICD-10-CM | POA: Insufficient documentation

## 2012-01-07 DIAGNOSIS — G5600 Carpal tunnel syndrome, unspecified upper limb: Secondary | ICD-10-CM | POA: Insufficient documentation

## 2012-01-07 DIAGNOSIS — E785 Hyperlipidemia, unspecified: Secondary | ICD-10-CM | POA: Insufficient documentation

## 2012-01-07 DIAGNOSIS — E119 Type 2 diabetes mellitus without complications: Secondary | ICD-10-CM | POA: Insufficient documentation

## 2012-01-07 DIAGNOSIS — I10 Essential (primary) hypertension: Secondary | ICD-10-CM | POA: Insufficient documentation

## 2012-01-07 LAB — CBC WITH DIFFERENTIAL/PLATELET
Basophils Absolute: 0 10*3/uL (ref 0.0–0.1)
Basophils Relative: 0 % (ref 0–1)
Eosinophils Absolute: 0.3 10*3/uL (ref 0.0–0.7)
Eosinophils Relative: 3 % (ref 0–5)
HCT: 36 % (ref 36.0–46.0)
Hemoglobin: 12.3 g/dL (ref 12.0–15.0)
Lymphocytes Relative: 22 % (ref 12–46)
Lymphs Abs: 2.2 10*3/uL (ref 0.7–4.0)
MCH: 32.1 pg (ref 26.0–34.0)
MCHC: 34.2 g/dL (ref 30.0–36.0)
MCV: 94 fL (ref 78.0–100.0)
Monocytes Absolute: 1 10*3/uL (ref 0.1–1.0)
Monocytes Relative: 11 % (ref 3–12)
Neutro Abs: 6.2 10*3/uL (ref 1.7–7.7)
Neutrophils Relative %: 64 % (ref 43–77)
Platelets: 286 10*3/uL (ref 150–400)
RBC: 3.83 MIL/uL — ABNORMAL LOW (ref 3.87–5.11)
RDW: 12 % (ref 11.5–15.5)
WBC: 9.8 10*3/uL (ref 4.0–10.5)

## 2012-01-07 LAB — BASIC METABOLIC PANEL
BUN: 10 mg/dL (ref 6–23)
CO2: 24 mEq/L (ref 19–32)
Calcium: 9.1 mg/dL (ref 8.4–10.5)
Chloride: 101 mEq/L (ref 96–112)
Creatinine, Ser: 0.68 mg/dL (ref 0.50–1.10)
GFR calc Af Amer: >90 mL/min (ref >90)
GFR calc non Af Amer: >90 mL/min (ref >90)
Glucose, Bld: 241 mg/dL — ABNORMAL HIGH (ref 70–99)
Potassium: 3.9 mEq/L (ref 3.5–5.1)
Sodium: 136 mEq/L (ref 135–145)

## 2012-01-07 MED ORDER — ALBUTEROL SULFATE HFA 108 (90 BASE) MCG/ACT IN AERS
2.0000 | INHALATION_SPRAY | RESPIRATORY_TRACT | Status: DC | PRN
  Administered 2012-01-07: 2 via RESPIRATORY_TRACT
  Filled 2012-01-07: qty 6.7

## 2012-01-07 MED ORDER — IPRATROPIUM BROMIDE 0.02 % IN SOLN
0.5000 mg | Freq: Once | RESPIRATORY_TRACT | Status: AC
  Administered 2012-01-07: 0.5 mg via RESPIRATORY_TRACT
  Filled 2012-01-07: qty 2.5

## 2012-01-07 MED ORDER — ALBUTEROL SULFATE (5 MG/ML) 0.5% IN NEBU
5.0000 mg | INHALATION_SOLUTION | Freq: Once | RESPIRATORY_TRACT | Status: AC
  Administered 2012-01-07: 5 mg via RESPIRATORY_TRACT
  Filled 2012-01-07: qty 40

## 2012-01-07 MED ORDER — PREDNISONE 20 MG PO TABS: 20.0000 mg | ORAL_TABLET | Freq: Two times a day (BID) | ORAL | Status: DC

## 2012-01-07 MED ORDER — PREDNISONE 20 MG PO TABS
60.0000 mg | ORAL_TABLET | Freq: Once | ORAL | Status: AC
  Administered 2012-01-07: 60 mg via ORAL
  Filled 2012-01-07: qty 3

## 2012-01-07 MED ORDER — SODIUM CHLORIDE 0.9 % IV BOLUS (SEPSIS)
1000.0000 mL | Freq: Once | INTRAVENOUS | Status: AC
  Administered 2012-01-07: 1000 mL via INTRAVENOUS

## 2012-01-07 MED ORDER — HYDROCOD POLST-CHLORPHEN POLST 10-8 MG/5ML PO LQCR: 5.0000 mL | Freq: Two times a day (BID) | ORAL | Status: DC | PRN

## 2012-01-07 MED ORDER — ONDANSETRON HCL 8 MG PO TABS: 8.0000 mg | ORAL_TABLET | Freq: Three times a day (TID) | ORAL | Status: DC | PRN

## 2012-01-07 NOTE — ED Notes (Signed)
Ambulated pt down hall way pt stated "feeling better".  98% room air.

## 2012-01-07 NOTE — ED Provider Notes (Signed)
Medical screening examination/treatment/procedure(s) were conducted as a shared visit with non-physician practitioner(s) and myself.  I personally evaluated the patient during the encounter  Pt with persistent cough and SOB. Tachycardia in the ED. No fever, no hypoxia.   Kalista Laguardia B. Bernette Mayers, MD 01/07/12 [phone]

## 2012-01-07 NOTE — ED Notes (Signed)
Patient with increased shortness of breath.  Patient was given Jerilynn Som for her cough and Zpak by PCP.  Patient states she is getting worse and the Perles are not helping with her cough.

## 2012-01-07 NOTE — ED Provider Notes (Signed)
History     CSN: [id number]  Arrival date & time 01/07/12  5621   First MD Initiated Contact with Patient 01/07/12 0355      Chief Complaint  Patient presents with  . Shortness of Breath  . Cough    (Consider location/radiation/quality/duration/timing/severity/associated sxs/prior treatment) HPI History provided by pt.   Pt has had a cough and low-grade fever for the past week.  Associated w/ nasal/chest congestion.  PCP diagnosed w/ URI 4 days ago.  Sx have worsened and this morning she woke with SOB.  Aggravated by coughing and exertion.  No CP.  Remote h/o asthma.  Does not smoke cigarettes.   Past Medical History  Diagnosis Date  . Asthma   . Diabetes mellitus type 2, uncontrolled DX: 1999    Started as gestational diabetes  . HSV (herpes simplex virus) anogenital infection   . Migraine     Normal CT head (08/06/2006)  . Carpal tunnel syndrome   . ESSENTIAL HYPERTENSION 07/08/2006  . HYPERLIPIDEMIA 08/20/2007    Past Surgical History  Procedure Date  . Cholecystectomy 2009    No family history on file.  History  Substance Use Topics  . Smoking status: Never Smoker   . Smokeless tobacco: Never Used  . Alcohol Use: No    OB History    Grav Para Term Preterm Abortions TAB SAB Ect Mult Living                  Review of Systems  All other systems reviewed and are negative.    Allergies  Review of patient's allergies indicates no known allergies.  Home Medications   Current Outpatient Rx  Name  Route  Sig  Dispense  Refill  . ALBUTEROL SULFATE HFA 108 (90 BASE) MCG/ACT IN AERS   Inhalation   Inhale 2 puffs into the lungs every 6 (six) hours as needed for wheezing.   1 Inhaler   0   . ATORVASTATIN CALCIUM 10 MG PO TABS      Take 1/2 tablet by mouth once a night.   45 tablet   3   . AZITHROMYCIN 250 MG PO TABS      Take 2 tablets (500 mg) on  Day 1,  followed by 1 tablet (250 mg) once daily on Days 2 through 5.   6 each   0   . BENZONATATE  100 MG PO CAPS   Oral   Take 2 capsules (200 mg total) by mouth 3 (three) times daily as needed for cough.   30 capsule   1   . ELETRIPTAN HYDROBROMIDE 20 MG PO TABS   Oral   One tablet by mouth at onset of headache. May repeat in 2 hours if headache persists or recurs. Repeat only once if needed.   18 tablet   1   . GLIPIZIDE 10 MG PO TABS   Oral   Take 1 tablet (10 mg total) by mouth 2 (two) times daily before a meal.   180 tablet   1     PLEASE CANCEL EXISTING PRESCRIPTION FOR GLIPIZIDE  .Marland Kitchen.   . GUAIFENESIN ER 600 MG PO TB12   Oral   Take 600 mg by mouth 2 (two) times daily as needed. For cold symptoms         . METFORMIN HCL 1000 MG PO TABS   Oral   Take 1 tablet (1,000 mg total) by mouth 2 (two) times daily with a meal.   60  tablet   11   . PROPRANOLOL HCL 20 MG PO TABS   Oral   Take 1 tablet (20 mg total) by mouth 2 (two) times daily.   60 tablet   5   . NYQUIL PO   Oral   Take 30 mLs by mouth at bedtime as needed. For sleep and cold symptoms         . QUINAPRIL HCL 10 MG PO TABS   Oral   Take 1 tablet (10 mg total) by mouth at bedtime.   90 tablet   0   . SITAGLIPTIN PHOSPHATE 100 MG PO TABS   Oral   Take 1 tablet (100 mg total) by mouth daily.   90 tablet   1     BP 127/101  Pulse 139  Temp 99.4 F (37.4 C) (Oral)  Resp 22  SpO2 97%  LMP 12/05/2011  Physical Exam  Nursing note and vitals reviewed. Constitutional: She is oriented to person, place, and time. She appears well-developed and well-nourished. No distress.  HENT:  Head: Normocephalic and atraumatic.       Nasal drainage  Eyes:       Normal appearance  Neck: Normal range of motion.  Cardiovascular: Normal rate, regular rhythm and intact distal pulses.   Pulmonary/Chest: Effort normal. No respiratory distress.       Coughing.  Diffuse expiratory wheezing.    Abdominal: Soft. Bowel sounds are normal. She exhibits no distension. There is no tenderness. There is no guarding.   Musculoskeletal: Normal range of motion.       No peripheral edema or calf tenderness  Neurological: She is alert and oriented to person, place, and time.  Skin: Skin is warm and dry. No rash noted.  Psychiatric: She has a normal mood and affect. Her behavior is normal.    ED Course  Procedures (including critical care time)   Date: 01/07/2012  Rate: 119  Rhythm: sinus tachycardia  QRS Axis: normal  Intervals: normal  ST/T Wave abnormalities: normal  Conduction Disutrbances:none  Narrative Interpretation: LVH  Old EKG Reviewed: none available   Labs Reviewed  CBC WITH DIFFERENTIAL - Abnormal; Notable for the following:    RBC 3.83 (*)     All other components within normal limits  BASIC METABOLIC PANEL - Abnormal; Notable for the following:    Glucose, Bld 241 (*)     All other components within normal limits   Dg Chest 2 View  01/07/2012  *RADIOLOGY REPORT*  Clinical Data: Cough, fever.  CHEST - 2 VIEW  Comparison: 06/01/2008  Findings: Cardiomediastinal contours are unchanged, within normal limits.  There is mild central peribronchial cuffing.  No confluent airspace opacity.  No pleural effusion or pneumothorax.  No acute osseous finding.  IMPRESSION: Mild bronchitic change (acute or chronic) without confluent airspace opacity.   Original Report Authenticated By: Jearld Lesch, M.D.      1. Bronchitis       MDM  [phone] F w/ one week cough and congestion woke w/ SOB early this morning.  On exam, afebrile, tachycardic, no respiratory distress, coughing, diffuse exp wheezing.  EKG shows LVH but no prior for comparison. Suspect bronchitis but CXR pending d/t tachycardia and new onset SOB.  Pt to receive neb treatment and prednisone, as well as NS bolus.  4:55 AM  CXR shows bronchitis.  Labs unremarkable.  Results discussed w/ pt.  She reports relief w/ breathing treatment.  On repeat exam, wheezing has improved and patient  is no longer coughing.  Will ambulate and if she  does not become symptomatic or drop her sats, d/c home w/ albuterol inhaler, prednisone, tussionex and zofran.  She has f/u scheduled w/ her PCP on 12/27.  Advised her to have them look at her EKG from this visit d/t LVH.  Return precautions discussed. 6:01 AM         Otilio Miu, PA-C 01/07/12 [phone]

## 2012-01-11 ENCOUNTER — Other Ambulatory Visit: Payer: Self-pay | Admitting: *Deleted

## 2012-01-11 DIAGNOSIS — E1139 Type 2 diabetes mellitus with other diabetic ophthalmic complication: Secondary | ICD-10-CM

## 2012-01-11 MED ORDER — SITAGLIPTIN PHOSPHATE 100 MG PO TABS: 100.0000 mg | ORAL_TABLET | Freq: Every day | ORAL | Status: DC

## 2012-01-11 NOTE — Telephone Encounter (Signed)
Rx called in to pharmacy. 

## 2012-01-18 ENCOUNTER — Ambulatory Visit (HOSPITAL_COMMUNITY)
Admission: RE | Admit: 2012-01-18 | Discharge: 2012-01-18 | Disposition: A | Discharge status: Home / Self Care | Payer: Managed Care, Other (non HMO) | Source: Ambulatory Visit | Attending: Internal Medicine | Admitting: Internal Medicine

## 2012-01-18 ENCOUNTER — Ambulatory Visit (INDEPENDENT_AMBULATORY_CARE_PROVIDER_SITE_OTHER): Payer: Managed Care, Other (non HMO) | Admitting: Internal Medicine

## 2012-01-18 ENCOUNTER — Encounter: Payer: Self-pay | Admitting: Internal Medicine

## 2012-01-18 VITALS — BP 134/92 | HR 117 | Temp 98.6°F | Ht 60.0 in | Wt 215.1 lb

## 2012-01-18 DIAGNOSIS — J069 Acute upper respiratory infection, unspecified: Secondary | ICD-10-CM

## 2012-01-18 DIAGNOSIS — I498 Other specified cardiac arrhythmias: Secondary | ICD-10-CM | POA: Insufficient documentation

## 2012-01-18 DIAGNOSIS — E1139 Type 2 diabetes mellitus with other diabetic ophthalmic complication: Secondary | ICD-10-CM

## 2012-01-18 DIAGNOSIS — G43009 Migraine without aura, not intractable, without status migrainosus: Secondary | ICD-10-CM

## 2012-01-18 DIAGNOSIS — I447 Left bundle-branch block, unspecified: Secondary | ICD-10-CM | POA: Insufficient documentation

## 2012-01-18 DIAGNOSIS — I10 Essential (primary) hypertension: Secondary | ICD-10-CM

## 2012-01-18 DIAGNOSIS — E11319 Type 2 diabetes mellitus with unspecified diabetic retinopathy without macular edema: Secondary | ICD-10-CM

## 2012-01-18 MED ORDER — PROPRANOLOL HCL 20 MG PO TABS: 20.0000 mg | ORAL_TABLET | Freq: Two times a day (BID) | ORAL | Status: DC

## 2012-01-18 MED ORDER — HYDROCOD POLST-CHLORPHEN POLST 10-8 MG/5ML PO LQCR: 5.0000 mL | Freq: Two times a day (BID) | ORAL | Status: DC | PRN

## 2012-01-18 NOTE — Progress Notes (Signed)
Patient: Sandra [name]   MRN: [id number]  DOB: 10/30/1965  PCP: Priscella Mann, DO   Subjective:    HPI: Sandra [name] is a 46 y.o. female with a PMHx of uncontrolled type 2 diabetes, retention, hyperlipidemia, obesity, and asthma who presented to clinic today for the following:  1) Bronchitis - Patient seen on 12/12 for bronchitis, she was started on Azithromycin x 5 days, completed this course. She was seen in ED on 01/08/2012 and was also treated with prednisone (which she has completed) and was given Tussionex which was more effective than tessalon perles. She denies fevers, chills, states cough is improving slowly, no sore throat.  2) HTN - Patient does not check blood pressure regularly at home. Currently taking quinapril 10 mg and propranolol 20 mg twice a day (also used for her migraine prophylaxis). Patient states that she does not miss her medications typically, however, has been out of her propranolol for a few days.  denies headaches, dizziness, lightheadedness, chest pain, shortness of breath. does not request refills today.  3) DM2, uncontrolled -  Lab Results  Component Value Date   HGBA1C 8.0 11/05/2011   Patient checking blood sugars every few days at random times of day. Currently taking Metformin 1000 BID, Januvia 100 mg, Glipizide 10mg  BID. Patient continues to miss doses 2-3 x per week on average.  0 hypoglycemic episodes since last visit. Denies assisted hypoglycemia or recently hospitalizations for either hyper or hypoglycemia. denies polyuria, polydipsia, nausea, vomiting, diarrhea.  does not request refills today.  In regards to diabetic complications:  Microvascular complications: Confirms: retinopathy ; Denies nephropathy, autonomic neuropathy and peripheral neuropathy.  Macrovascular complications: Confirms: none ; Denies cardiovascular disease, cerebrovascular disease and peripheral vascular disease.   Important diabetic medications: Is  patient on aspirin? No - 10 year cardiovascular risk approximately 1% Is patient on a statin? Yes Is patient on an ACE-I/ ARB? Yes  4) Abnormal EKG - during patient's ER visit, an EKG was performed in the setting of shortness of breath, which showed new LBBB from prior EKG in 2007, she was instructed to follow-up with her PCP regarding results. Patient indicates that she remains WITHOUT chest pain, palpitations, shortness of breath with exertion, chest pain with associated nausea/vomiting/diaphoresis. She has no prior personal history of heart disease, and no family history of early heart disease.   Review of Systems: Per HPI.   Current Outpatient Medications: Medication Sig  . albuterol (PROVENTIL HFA;VENTOLIN HFA) 108 (90 BASE) MCG/ACT inhaler Inhale 2 puffs into the lungs every 6 (six) hours as needed for wheezing.  Marland Kitchen atorvastatin (LIPITOR) 10 MG tablet Take 1/2 tablet by mouth once a night.  . chlorpheniramine-HYDROcodone (TUSSIONEX PENNKINETIC ER) 10-8 MG/5ML LQCR Take 5 mLs by mouth every 12 (twelve) hours as needed.  . eletriptan (RELPAX) 20 MG tablet One tablet by mouth at onset of headache. May repeat in 2 hours if headache persists or recurs. Repeat only once if needed.  Marland Kitchen glipiZIDE (GLUCOTROL) 10 MG tablet Take 1 tablet (10 mg total) by mouth 2 (two) times daily before a meal.  . metFORMIN (GLUCOPHAGE) 1000 MG tablet Take 1 tablet (1,000 mg total) by mouth 2 (two) times daily with a meal.  . ondansetron (ZOFRAN) 8 MG tablet Take 1 tablet (8 mg total) by mouth every 8 (eight) hours as needed for nausea.  . propranolol (INDERAL) 20 MG tablet Take 1 tablet (20 mg total) by mouth 2 (two) times daily.  . quinapril (ACCUPRIL)  10 MG tablet Take 1 tablet (10 mg total) by mouth at bedtime.  . sitaGLIPtin (JANUVIA) 100 MG tablet Take 1 tablet (100 mg total) by mouth daily.    Allergies: No Known Allergies   Past Medical History  Diagnosis Date  . Asthma   . Diabetes mellitus type 2,  uncontrolled DX: 1999    Started as gestational diabetes  . HSV (herpes simplex virus) anogenital infection   . Migraine     Normal CT head (08/06/2006)  . Carpal tunnel syndrome   . ESSENTIAL HYPERTENSION 07/08/2006  . HYPERLIPIDEMIA 08/20/2007    Past Surgical History  Procedure Date  . Cholecystectomy 2009     Objective:    Physical Exam: Filed Vitals:   01/18/12 1504  BP: 134/92      General: Vital signs reviewed and noted. Well-developed, well-nourished, in no acute distress; alert, appropriate and cooperative throughout examination.  Head: Normocephalic, atraumatic.  Lungs:  Normal respiratory effort. Clear to auscultation BL without crackles or wheezes.  Heart: RRR. S1 and S2 normal without gallop, rubs. No murmur.  Abdomen:  BS normoactive. Soft, Nondistended, non-tender.  No masses or organomegaly.  Extremities: No pretibial edema.    Assessment/ Plan:   Case and plan of care discussed with attending physician, Dr. Margarito Liner.

## 2012-01-18 NOTE — Patient Instructions (Signed)
General Instructions:  Please follow-up at the clinic in 1 month, at which time we will reevaluate your blood pressure, diabetes, cough - OR, please follow-up in the clinic sooner if needed.  There have been changes in your medications:  Start aspirin 81 mg daily   I refilled your propranolol - start taking it every day, and tell me early if you are about to run out so I can get it filled for you - you should not stop this medication abruptly.   Get the echocardiogram performed that we discussed.  Followup with cardiology about your abnormal EKG.  Return to the ER if the develops symptoms of chest pain, worsening shortness of breath, severe stomach pains.  Start taking her diabetic medications regularly and do not miss doses this is very important for your overall health.    If you have been started on new medication(s), and you develop symptoms concerning for allergic reaction, including, but not limited to, throat closing, tongue swelling, rash, please stop the medication immediately and call the clinic at [phone], and go to the ER.  If you are diabetic, please bring your meter to your next visit.  If symptoms worsen, or new symptoms arise, please call the clinic or go to the ER.  PLEASE BRING ALL OF YOUR MEDICATIONS  IN A BAG TO YOUR NEXT APPOINTMENT   Treatment Goals:  Goals (1 Years of Data) as of 01/18/2012          As of Today 01/07/12 01/07/12 01/03/12 11/20/11     Blood Pressure    . Blood Pressure < 140/80  134/92 140/88 127/101 184/107 161/103     Result Component    . HEMOGLOBIN A1C < 7.0          . LDL CALC < 100          . LDL CALC < 100            Progress Toward Treatment Goals:  Treatment Goal 01/03/2012  Hemoglobin A1C unchanged  Blood pressure deteriorated    Self Care Goals & Plans:  Self Care Goal 01/18/2012  Manage my medications refill my medications on time; take my medicines as prescribed  Monitor my health keep track of my blood  glucose  Eat healthy foods eat smaller portions; eat foods that are low in salt; eat baked foods instead of fried foods  Be physically active find time in my schedule; take a walk every day    Home Blood Glucose Monitoring 01/03/2012  Check my blood sugar once a day  When to check my blood sugar before breakfast     Care Management & Community Referrals:  Referral 01/03/2012  Referrals made for care management support none needed        Angina Angina is discomfort caused by inadequate oxygen delivery to the heart muscle. Angina is a response to blockage or narrowing of the coronary arteries. It alerts you about a blood flow problem to your heart. New, more frequent, or severe angina is a warning signal for you to seek medical care right away.   CAUSES The coronary arteries supply blood and oxygen to the heart muscle. The heart muscle needs a constant supply of oxygen in order to pump blood to the body. These arteries often become blocked by hardened deposits of blood products (plaque) including fat and cholesterol. The following contribute to the formation of plaque:  High cholesterol.   High blood pressure.   Smoking.   Obesity.  Diabetes.  SYMPTOMS   The most common problem is a deep discomfort in the center of your chest. The discomfort may also be felt in, or move to your:   Arms (especially the left one).   Throat.   Jaw.   Back.   Upper stomach.   Angina may be brought on by exercise, emotional upset, heavy meals, or extremes of heat or cold.   It may resolve within 5 to 10 minutes after a period of rest.   Angina symptoms vary from person to person.   If you have angina and it is treated, it may resolve. If you feel it again, the symptoms may not be the same in both type and location.  DIAGNOSIS    Emergency room evaluation or hospital admission may be needed to determine if there are any blockages of your coronary arteries.   Blood tests, EKGs, and  chest X-rays may be done.   Further testing may include a stress test or an angiogram.   A heart specialist (cardiologist) may be asked to assist with your evaluation.   Other conditions that may feel like angina, but are not a problem with the heart include:   Muscle strain in the chest wall.   Blood clots in the lung.   Anxiety.   Acid reflux from the stomach.  RISKS AND COMPLICATIONS   Angina that is not treated or evaluated can lead to a decline in oxygen delivery to the heart muscle, a heart attack, or even death. TREATMENT   Depending on severity of the blockages and on other factors, angina may be treated with:  Lifestyle changes such as weight loss, stopping smoking, appropriate exercise, or low cholesterol and low salt diet.   Medications to control or to treat the risk factors for angina.   Procedures such as angioplasty or stent placement may allow the blockages to be opened without surgery.   Open heart surgery may be needed to bypass blocked arteries that cannot be treated by other methods.  HOME CARE INSTRUCTIONS    If your caregiver prescribed medication to control your angina, take them as directed. Report side effects. Do not stop medications or adjust the dosages on your own.   Regular exercise is good for you as long as it does not cause discomfort. Avoid activities that trigger attacks of angina. Walking is the best exercise. Do not begin any new type of exercise until you check with your caregiver.   You may still have a sexual relationship if it does not cause angina. Tell your caregiver if it does.   Stop smoking. Do not use nicotine patches or gum until you check with your caregiver.   If you are overweight, you should lose weight. Eat a heart healthy diet that is low in fat and salt.   If your caregiver has given you a follow-up appointment, it is very important to keep that appointment. Not keeping the appointment could result in a heart attack,  permanent heart damage, and disability. If there is any problem keeping the appointment, you must call back to this facility for assistance.  SEEK MEDICAL CARE IF:    Your angina seems to be occurring more frequently or seems to be lasting longer.   You are having problems that you think may be side effects of the medicine you are taking.  SEEK IMMEDIATE MEDICAL CARE IF:    You have severe chest discomfort, especially if the pain is crushing or pressure-like and spreads to  the arms, back, neck, or jaw.   You are sweating, feel sick to your stomach (nauseous), or have shortness of breath.   You have an attack of angina that does not get better after rest or taking your usual medicine.   You wake from sleep with chest pain.   You feel dizzy, faint, or experience extreme fatigue.   You have chest pain not typical of your usual angina. THIS IS AN EMERGENCY. Do not wait to see if the pain will go away. Get medical help at once. Call 911. DO NOT drive yourself to the hospital.  MAKE SURE YOU:    Understand these instructions.   Will watch your condition.   Will get help right away if you are not doing well or get worse.  Document Released: 01/08/2005 Document Revised: 04/02/2011 Document Reviewed: 05/23/2011 [facility] Patient Information 2013 [place], [place].

## 2012-01-23 ENCOUNTER — Emergency Department (HOSPITAL_COMMUNITY): Payer: Managed Care, Other (non HMO)

## 2012-01-23 ENCOUNTER — Emergency Department (HOSPITAL_COMMUNITY)
Admission: EM | Admit: 2012-01-23 | Discharge: 2012-01-23 | Disposition: A | Discharge status: Home / Self Care | Payer: Managed Care, Other (non HMO) | Attending: Emergency Medicine | Admitting: Emergency Medicine

## 2012-01-23 ENCOUNTER — Encounter (HOSPITAL_COMMUNITY): Payer: Self-pay | Admitting: *Deleted

## 2012-01-23 DIAGNOSIS — R079 Chest pain, unspecified: Secondary | ICD-10-CM

## 2012-01-23 DIAGNOSIS — E119 Type 2 diabetes mellitus without complications: Secondary | ICD-10-CM | POA: Insufficient documentation

## 2012-01-23 DIAGNOSIS — Z8679 Personal history of other diseases of the circulatory system: Secondary | ICD-10-CM | POA: Insufficient documentation

## 2012-01-23 DIAGNOSIS — Z8619 Personal history of other infectious and parasitic diseases: Secondary | ICD-10-CM | POA: Insufficient documentation

## 2012-01-23 DIAGNOSIS — Z8739 Personal history of other diseases of the musculoskeletal system and connective tissue: Secondary | ICD-10-CM | POA: Insufficient documentation

## 2012-01-23 DIAGNOSIS — R0789 Other chest pain: Secondary | ICD-10-CM | POA: Insufficient documentation

## 2012-01-23 DIAGNOSIS — J45909 Unspecified asthma, uncomplicated: Secondary | ICD-10-CM | POA: Insufficient documentation

## 2012-01-23 DIAGNOSIS — R209 Unspecified disturbances of skin sensation: Secondary | ICD-10-CM | POA: Insufficient documentation

## 2012-01-23 DIAGNOSIS — E785 Hyperlipidemia, unspecified: Secondary | ICD-10-CM | POA: Insufficient documentation

## 2012-01-23 DIAGNOSIS — R0602 Shortness of breath: Secondary | ICD-10-CM | POA: Insufficient documentation

## 2012-01-23 DIAGNOSIS — I10 Essential (primary) hypertension: Secondary | ICD-10-CM | POA: Insufficient documentation

## 2012-01-23 DIAGNOSIS — Z79899 Other long term (current) drug therapy: Secondary | ICD-10-CM | POA: Insufficient documentation

## 2012-01-23 LAB — BASIC METABOLIC PANEL
BUN: 18 mg/dL (ref 6–23)
CO2: 23 mEq/L (ref 19–32)
Calcium: 9.5 mg/dL (ref 8.4–10.5)
Chloride: 99 mEq/L (ref 96–112)
Creatinine, Ser: 0.62 mg/dL (ref 0.50–1.10)
GFR calc Af Amer: >90 mL/min (ref >90)
GFR calc non Af Amer: >90 mL/min (ref >90)
Glucose, Bld: 259 mg/dL — ABNORMAL HIGH (ref 70–99)
Potassium: 4.7 mEq/L (ref 3.5–5.1)
Sodium: 134 mEq/L — ABNORMAL LOW (ref 135–145)

## 2012-01-23 LAB — POCT I-STAT, CHEM 8
BUN: 22 mg/dL (ref 6–23)
Calcium, Ion: 1.22 mmol/L (ref 1.12–1.23)
Chloride: 102 mEq/L (ref 96–112)
Creatinine, Ser: 0.7 mg/dL (ref 0.50–1.10)
Glucose, Bld: 250 mg/dL — ABNORMAL HIGH (ref 70–99)
HCT: 39 % (ref 36.0–46.0)
Hemoglobin: 13.3 g/dL (ref 12.0–15.0)
Potassium: 4.6 mEq/L (ref 3.5–5.1)
Sodium: 137 mEq/L (ref 135–145)
TCO2: 24 mmol/L (ref 0–100)

## 2012-01-23 LAB — CBC
HCT: 36.6 % (ref 36.0–46.0)
Hemoglobin: 12.6 g/dL (ref 12.0–15.0)
MCH: 31.9 pg (ref 26.0–34.0)
MCHC: 34.4 g/dL (ref 30.0–36.0)
MCV: 92.7 fL (ref 78.0–100.0)
Platelets: 289 10*3/uL (ref 150–400)
RBC: 3.95 MIL/uL (ref 3.87–5.11)
RDW: 12.2 % (ref 11.5–15.5)
WBC: 12 10*3/uL — ABNORMAL HIGH (ref 4.0–10.5)

## 2012-01-23 LAB — POCT I-STAT TROPONIN I: Troponin i, poc: 0 ng/mL (ref 0.00–0.08)

## 2012-01-23 NOTE — Consult Note (Signed)
ED Consult Note Date: 01/23/2012  Patient name: Sandra [name] Medical record number: [id number] Date of birth: [birth year] Age: 47 y.o. Gender: female PCP: [name], DO   Service:  Internal Medicine Teaching Service  Attending Physician:  Dr. Debe Coder   First Contact:  Dr. Earlene Plater   Pager:  [phone] Second Contact:  Dr. Kristie Cowman Pager: [phone]     After 5PM, weekends, and holidays: First Contact:              Pager: [phone] Second Contact:         Pager: [phone]   Chief Complaint:  Back pain    History of Present Illness:  This is a 48 year old woman with type 2 diabetes, hypertension, and dyslipidemia; presenting to the emergency department with back pain. She was recently seen in the clinic and found to have left bundle branch block on EKG and was told she may have coronary artery disease. She was referred to cardiology and is scheduled to have an echocardiogram tomorrow with followup in 2 weeks. 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 She recalls screaming out in pain with the acute onset. Quality is described as dull and aching. Location of pain is left of the thoracic spine over the scapula without radiation. Severity was initially rated at 7/10, but now it is 4/10. The pain is constant but is worsened with movement. Certain movements, especially of the left side, increases the pain to about a 6/10. At pain onset, there were no associated symptoms; specifically no dyspnea, diaphoresis, nausea, or dizziness. Overnight last night, she did have some nausea without vomiting and she now has some numbness in the fingers on her left hand. Review of systems was otherwise negative.     Review of Systems:   Constitutional: Negative.  Negative for fever, chills and diaphoresis.  HENT: Negative.  Negative for neck pain.   Eyes: Negative.   Respiratory: Positive for  cough. Negative for sputum production and shortness of breath.   Cardiovascular: Negative.  Negative for chest pain.  Gastrointestinal: Positive for nausea. Negative for vomiting, abdominal pain, diarrhea, constipation and blood in stool.  Genitourinary: Negative.  Negative for dysuria and hematuria.  Musculoskeletal: Positive for back pain. Negative for joint pain.  Skin: Negative.  Negative for rash.  Neurological: Positive for tingling. Negative for dizziness and headaches.     Medical History: Past Medical History  Diagnosis Date  . Asthma   . Diabetes mellitus type 2, uncontrolled DX: 1999    Started as gestational diabetes  . HSV (herpes simplex virus) anogenital infection   . Migraine     Normal CT head (08/06/2006)  . Carpal tunnel syndrome   . ESSENTIAL HYPERTENSION 07/08/2006  . HYPERLIPIDEMIA 08/20/2007    Surgical History: Past Surgical History  Procedure Date  . Cholecystectomy 2009    Home Medications: Current Outpatient Rx  Name  Route  Sig  Dispense  Refill  . ALBUTEROL SULFATE HFA 108 (90 BASE) MCG/ACT IN AERS   Inhalation   Inhale 2 puffs into the lungs every 6 (six) hours as needed for wheezing.   1 Inhaler   0   . ATORVASTATIN CALCIUM 10 MG PO TABS      Take 1/2 tablet by mouth once a night.   45 tablet   3   . HYDROCOD POLST-CPM POLST ER 10-8 MG/5ML PO LQCR  Oral   Take 5 mLs by mouth every 12 (twelve) hours as needed.   60 mL   0   . ELETRIPTAN HYDROBROMIDE 20 MG PO TABS   Oral   One tablet by mouth at onset of headache. May repeat in 2 hours if headache persists or recurs. Repeat only once if needed.   18 tablet   1   . GLIPIZIDE 10 MG PO TABS   Oral   Take 1 tablet (10 mg total) by mouth 2 (two) times daily before a meal.   180 tablet   1     PLEASE CANCEL EXISTING PRESCRIPTION FOR GLIPIZIDE  .Marland Kitchen.   . METFORMIN HCL 1000 MG PO TABS   Oral   Take 1 tablet (1,000 mg total) by mouth 2 (two) times daily with a meal.   60 tablet    11   . ONDANSETRON HCL 8 MG PO TABS   Oral   Take 1 tablet (8 mg total) by mouth every 8 (eight) hours as needed for nausea.   20 tablet   0   . PROPRANOLOL HCL 20 MG PO TABS   Oral   Take 1 tablet (20 mg total) by mouth 2 (two) times daily.   60 tablet   1   . QUINAPRIL HCL 10 MG PO TABS   Oral   Take 1 tablet (10 mg total) by mouth at bedtime.   90 tablet   0   . SITAGLIPTIN PHOSPHATE 100 MG PO TABS   Oral   Take 1 tablet (100 mg total) by mouth daily.   90 tablet   1     Allergies: Allergies as of 01/23/2012  . (No Known Allergies)    Family History: No family history on file.  Social History: History   Social History  . Marital Status: Married    Spouse Name: N/A    Number of Children: N/A  . Years of Education: N/A   Occupational History  . Daycare teacher    Social History Main Topics  . Smoking status: Never Smoker   . Smokeless tobacco: Never Used  . Alcohol Use: No  . Drug Use: No  . Sexually Active: Not on file   Other Topics Concern  . Not on file   Social History Narrative   Studying at [employer] in early childhood education.Married.    Physical exam: GENERAL: obese; no acute distress HEAD: atraumatic, normocephalic EYES: pupils equal, round and reactive; sclera anicteric; normal conjunctiva EARS: canals patent and TMs normal bilaterally NOSE/THROAT: oropharynx clear, moist mucous membranes, pink gums, normal dentition NECK: supple, no carotid bruits, thyroid normal in size and without palpable nodules LYMPH: no cervical or supraclavicular lymphadenopathy LUNGS: clear to auscultation bilaterally, normal work of breathing HEART: normal rate and regular rhythm; normal S1 and S2 without S3 or S4; no murmurs, rubs, or clicks PULSES: radial and dorsalis pedis 2+ and symmetric ABDOMEN: soft, non-tender, normal bowel sounds MSK: full range of motion of the left shoulder and arm with minimal pain, no tenderness with palpation of the spine,  tenderness with palpation of the left trapezius muscle, spasm palpated in the left trapezius muscle compared to right SKIN: warm, dry, intact, normal turgor, no rashes EXTREMITIES: no peripheral edema, clubbing, or cyanosis PSYCH: patient is alert and oriented, mood and affect are normal and congruent, thought content is normal without delusions, thought process is linear, speech is normal and non-pressured, behavior is normal, judgement and insight are normal  Lab results: Basic Metabolic Panel:  Basename 01/23/12 0912 01/23/12 0843  NA 137 134*  K 4.6 4.7  CL 102 99  CO2 -- 23  GLUCOSE 250* 259*  BUN 22 18  CREATININE 0.70 0.62  CALCIUM -- 9.5  MG -- --  PHOS -- --   CBC:  Basename 01/23/12 0912 01/23/12 0843  WBC -- 12.0*  NEUTROABS -- --  HGB 13.3 12.6  HCT 39.0 36.6  MCV -- 92.7  PLT -- 289   Cardiac Enzymes: POC troponins 0.0 at 9:10  Imaging results: Dg Chest 2 View 01/23/2012   FINDINGS: The heart size and mediastinal contours are within normal limits.  Both lungs are clear.  The visualized skeletal structures are unremarkable.   IMPRESSION: No active disease.     Other results: EKG Results:  01/23/2012 Rate:  98 PR:  138 QRS:  126 QTc:  497 EKG: unchanged from previous tracings, normal sinus rhythm, LBBB.   Assessment and Plan:  1.   Back pain:  Almost surely musculoskeletal in origin given her history and exam findings. 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 I do not think this is cardiac or pulmonary in origin.  I think it is safe to discharge her home today, and have her attend the previously scheduled echocardiogram tomorrow. She will also keep her appointment with [facility] cardiology in 2 weeks.     Signed by:  Dorthula Rue. Earlene Plater, MD PGY-I, Internal Medicine  01/23/2012, 11:01 AM

## 2012-01-23 NOTE — ED Notes (Signed)
Patient transported to X-ray 

## 2012-01-23 NOTE — ED Notes (Signed)
Admitting at bedside 

## 2012-01-23 NOTE — ED Provider Notes (Signed)
History     CSN: [id number]  Arrival date & time 01/23/12  1324   First MD Initiated Contact with Patient 01/23/12 0830      Chief Complaint  Patient presents with  . Chest Pain     HPI Symptoms started yesterday while at work. PT was seen here before and was told she had an ekg and may have a blockage. PT is having tingling in left hand and pain in back. Pt states sharp chest pains last nite.  Schedule for echocardiogram tomorrow.  Told by her private physician she might have angina  Past Medical History  Diagnosis Date  . Asthma   . Diabetes mellitus type 2, uncontrolled DX: 1999    Started as gestational diabetes  . HSV (herpes simplex virus) anogenital infection   . Migraine     Normal CT head (08/06/2006)  . Carpal tunnel syndrome   . ESSENTIAL HYPERTENSION 07/08/2006  . HYPERLIPIDEMIA 08/20/2007    Past Surgical History  Procedure Date  . Cholecystectomy 2009    No family history on file.  History  Substance Use Topics  . Smoking status: Never Smoker   . Smokeless tobacco: Never Used  . Alcohol Use: No    OB History    Grav Para Term Preterm Abortions TAB SAB Ect Mult Living                  Review of Systems All other systems reviewed and are negative Allergies  Review of patient's allergies indicates no known allergies.  Home Medications   Current Outpatient Rx  Name  Route  Sig  Dispense  Refill  . ALBUTEROL SULFATE HFA 108 (90 BASE) MCG/ACT IN AERS   Inhalation   Inhale 2 puffs into the lungs every 6 (six) hours as needed for wheezing.   1 Inhaler   0   . ATORVASTATIN CALCIUM 10 MG PO TABS      Take 1/2 tablet by mouth once a night.   45 tablet   3   . HYDROCOD POLST-CPM POLST ER 10-8 MG/5ML PO LQCR   Oral   Take 5 mLs by mouth every 12 (twelve) hours as needed.   60 mL   0   . ELETRIPTAN HYDROBROMIDE 20 MG PO TABS   Oral   One tablet by mouth at onset of headache. May repeat in 2 hours if headache persists or recurs. Repeat only  once if needed.   18 tablet   1   . GLIPIZIDE 10 MG PO TABS   Oral   Take 1 tablet (10 mg total) by mouth 2 (two) times daily before a meal.   180 tablet   1     PLEASE CANCEL EXISTING PRESCRIPTION FOR GLIPIZIDE  .Marland Kitchen.   . METFORMIN HCL 1000 MG PO TABS   Oral   Take 1 tablet (1,000 mg total) by mouth 2 (two) times daily with a meal.   60 tablet   11   . ONDANSETRON HCL 8 MG PO TABS   Oral   Take 1 tablet (8 mg total) by mouth every 8 (eight) hours as needed for nausea.   20 tablet   0   . PROPRANOLOL HCL 20 MG PO TABS   Oral   Take 1 tablet (20 mg total) by mouth 2 (two) times daily.   60 tablet   1   . QUINAPRIL HCL 10 MG PO TABS   Oral   Take 1 tablet (10 mg total) by  mouth at bedtime.   90 tablet   0   . SITAGLIPTIN PHOSPHATE 100 MG PO TABS   Oral   Take 1 tablet (100 mg total) by mouth daily.   90 tablet   1     BP 126/72  Pulse 97  Temp 98.2 F (36.8 C) (Oral)  Resp 16  SpO2 96%  LMP 12/05/2011  Physical Exam  Nursing note and vitals reviewed. Constitutional: She is oriented to person, place, and time. She appears well-developed and well-nourished. No distress.  HENT:  Head: Normocephalic and atraumatic.  Eyes: Pupils are equal, round, and reactive to light.  Neck: Normal range of motion.  Cardiovascular: Normal rate and intact distal pulses.   Pulmonary/Chest: No respiratory distress.  Abdominal: Normal appearance. She exhibits no distension. There is no tenderness.  Musculoskeletal: Normal range of motion.  Neurological: She is alert and oriented to person, place, and time. No cranial nerve deficit.  Skin: Skin is warm and dry. No rash noted.  Psychiatric: She has a normal mood and affect. Her behavior is normal.    ED Course  Procedures (including critical care time)   Date: 01/23/2012  Rate: 98  Rhythm: normal sinus rhythm  QRS Axis: normal  Intervals: normal  ST/T Wave abnormalities: normal  Conduction Disutrbances: Left  bundle-branch block (old)  Narrative Interpretation: Right has decreased to normal since last EKG       Labs Reviewed  CBC - Abnormal; Notable for the following:    WBC 12.0 (*)     All other components within normal limits  BASIC METABOLIC PANEL - Abnormal; Notable for the following:    Sodium 134 (*)     Glucose, Bld 259 (*)     All other components within normal limits  POCT I-STAT, CHEM 8 - Abnormal; Notable for the following:    Glucose, Bld 250 (*)     All other components within normal limits  POCT I-STAT TROPONIN I   Dg Chest 2 View  01/23/2012  *RADIOLOGY REPORT*  Clinical Data:  Chest and back pain.  CHEST - 2 VIEW  Comparison: 01/07/2012  Findings: The heart size and mediastinal contours are within normal limits.  Both lungs are clear.  The visualized skeletal structures are unremarkable.  IMPRESSION: No active disease.   Original Report Authenticated By: Irish Lack, M.D.      1. Chest pain       MDM   Outpatient clinic consulted to evaluate and ER  Patient did not want to be admitted.  Was seen and evaluated by her primary care Dr. and patient agreement for cardiac echo tomorrow.  Patient invited to return to emergency department if condition changes or worsens prior to that time.       Nelia Shi, MD 01/23/12 1130

## 2012-01-23 NOTE — ED Notes (Signed)
Pt reports having (L) shoulder blade and (L) arm tingling and numbness.  Denies chest pain, denies N/V/SOB.  Reports SOB and chest heaviness on exertion.

## 2012-01-23 NOTE — Consult Note (Signed)
I saw and evaluated the patient, reviewed the resident's note and I agree with the findings and plan.   .Face to face Exam:  General:  Awake HEENT:  Atraumatic Resp:  Normal effort Abd:  Nondistended Neuro:No focal weakness Lymph: No adenopathy

## 2012-01-23 NOTE — ED Notes (Signed)
Symptoms started yesterday while at work.  PT was seen here before and was told she had an ekg and may have a blockage.  PT is having tingling in left hand and pain in back.  Pt states sharp chest pains last nite.

## 2012-01-24 ENCOUNTER — Ambulatory Visit (HOSPITAL_COMMUNITY)
Admission: RE | Admit: 2012-01-24 | Discharge: 2012-01-24 | Disposition: A | Discharge status: Home / Self Care | Payer: Managed Care, Other (non HMO) | Source: Ambulatory Visit | Attending: Internal Medicine | Admitting: Internal Medicine

## 2012-01-24 DIAGNOSIS — I447 Left bundle-branch block, unspecified: Secondary | ICD-10-CM | POA: Insufficient documentation

## 2012-01-24 DIAGNOSIS — I059 Rheumatic mitral valve disease, unspecified: Secondary | ICD-10-CM | POA: Insufficient documentation

## 2012-01-24 NOTE — Assessment & Plan Note (Signed)
Pertinent Labs: Lab Results  Component Value Date   HGBA1C 8.0 11/05/2011   HGBA1C 12.3 10/11/2009   CREATININE 0.70 01/23/2012   CREATININE 0.69 11/05/2011   MICROALBUR 1.26 03/21/2011   MICRALBCREAT 6.3 03/21/2011   CHOL 112 03/21/2011   HDL 31* 03/21/2011   TRIG 115 03/21/2011    Assessment: Disease Control:    Progress toward goals:    Barriers to meeting goals: nonadherence to medications  Microvascular complications: retinopathy  Macrovascular complications: none  On aspirin: No   On statin: Yes  On ACE-I/ ARB: Yes    Patient is compliant most of the time with prescribed medications.   Plan: Glucometer log was not reviewed today, as pt did not have glucometer available for review.   continue current medications  Encouraged improved compliance with her home regimen, with the caveat that she is already on almost maximal oral medications, therefore if she cannot achieve adequate blood sugar control she may end up wiring insulin initiation in the future.  She was again provided counseling that she must start being appropriately compliant with her diabetic medications to help avoid complications.  START aspirin 81mg  daily today in setting of her EKG changes.  reminded to bring blood glucose meter & log to each visit  Educational resources provided:    Self management tools provided:    Home glucose monitoring recommendation:    once daily before breakfast and PRN

## 2012-01-24 NOTE — Assessment & Plan Note (Addendum)
Pertinent Data: BP Readings from Last 3 Encounters:  01/18/12 134/92  01/07/12 140/88    Basic Metabolic Panel:  Ref. Range 01/07/2012  Sodium Latest Range: 135-145 mEq/L 136  Potassium Latest Range: 3.5-5.1 mEq/L 3.9  Chloride Latest Range: 96-112 mEq/L 101  CO2 Latest Range: 19-32 mEq/L 24  BUN Latest Range: 6-23 mg/dL 10  Creatinine Latest Range: 0.50-1.10 mg/dL 1.61  Calcium Latest Range: 8.4-10.5 mg/dL 9.1  GFR calc Af Amer Latest Range: >90 mL/min >90  Glucose Latest Range: 70-99 mg/dL 096 (H)    Assessment: Disease Control:  Controlled  Progress toward goals:  At goal  Barriers to meeting goals: no barriers identified    PATIENT HAS BEEN OUT OF PER PROPRANOLOL AND WAS NOT TAKING REGULARLY, THIS MAY ALSO ACCOUNTS FOR HER REBOUND TACHYCARDIA.  Patient is compliant most of the time with prescribed medications.   Plan:  continue current medications  Stressed importance of medication compliance.  Educational resources provided:    Self management tools provided:

## 2012-01-24 NOTE — Assessment & Plan Note (Addendum)
Pertinent Data:  EKG (01/07/2012) - New LBBB, sinus tachycardia.  EKG (01/18/2012) - Sinus rhythm, LBBB.  Assessment: Today, we compared old EKG from 2007 and repeated an EKG today. The old EKG did not show evidence of LBBB, therefore, this is a new finding. Patient's 10-year cardiovascular risk per ATP is 1% and therefore, was previously not on aspirin therapy. Cause of this EKG is unclear, possibly contributed by her previously uncontrolled HTN. However, given CV risks of DM, obesity, HLD and new EKG finding, we must explore cardiac cause of her findings.  Plan:      EKG performed today.  Order 2-D echocardiogram.  Start Aspirin 81mg  daily.  Cardiology referral for continued evaluation with consideration of stress test. Patient informed of the red flag symptoms that should prompt her immediate return for reevaluation - such as, but not limited to the following: chest pain, progressive or worsening SOB, palpitations. She expresses understanding of the information provided.

## 2012-01-24 NOTE — Assessment & Plan Note (Signed)
Assessment: During last appt together, pt was thought to have recurrent URI and because symptoms persisted > 1 week and were progressively worsening, she was treated with 5 day course of Azithromycin. She returned to the ED and was also treated with prednisone taper that she has completed.  Cough is slowly improving. No longer having fevers.   Plan:      No additional Abx for now.  Refilled her tussionex.   Of note, if symptoms persistent longitudinally, may need to evaluate for ACE inhibitor associated cough (this is thought to be much less likely given her other constellation of symptoms).

## 2012-01-24 NOTE — Progress Notes (Signed)
  Echocardiogram 2D Echocardiogram has been performed.  [name] A 01/24/2012, 3:27 PM

## 2012-01-28 ENCOUNTER — Telehealth: Payer: Self-pay | Admitting: *Deleted

## 2012-01-28 ENCOUNTER — Telehealth: Payer: Self-pay | Admitting: Internal Medicine

## 2012-01-28 NOTE — Telephone Encounter (Signed)
Pt called asking for results of 2D Echo done on 1/2 The results look normal to me but wanted you to review and call pt @ [phone]

## 2012-01-28 NOTE — Telephone Encounter (Signed)
Thanks Inocencio Homes, I spoke with Ms. Livolsi tonight.  Thank you! - [name], D.O., 01/28/2012, 8:14 PM

## 2012-01-28 NOTE — Telephone Encounter (Signed)
  INTERNAL MEDICINE RESIDENCY PROGRAM After-Hours Telephone Call    Reason for call:   I placed an outgoing call to [name] on 01/28/2012, at 8:15 PM  regarding results of her recent echocardiogram on 01/24/2012 which showed mild increased LVH, normal LV systolic function with EF 50-55%, and inability to assess diastolic parameters 2/2 tachycardia.    Last encounter / Pertinent Data:   01/18/2012 - Last seen in Clinic by me for ED follow-up of URI and for ED follow-up of new LBBB noted per EKG.  She did not have associated CP, SOB, palpitations, etc. She was set up for outpt echocardiogram and cardiology follow-up, which is scheduled on 02/06/2012, for consideration of stress test.    Assessment/ Plan:   Mild LVH - likely contributed by previously uncontrolled HTN. Patient was informed of these results, and that she should continue with cardiology follow-up as planned.   No focal wall motion abnormalities noted on echo.  As always, pt is advised that if symptoms worsen or new symptoms arise, they should go to an urgent care facility or to to ER for further evaluation.    Sandra Mann, DO   01/28/2012, 8:15 PM

## 2012-02-06 ENCOUNTER — Ambulatory Visit (INDEPENDENT_AMBULATORY_CARE_PROVIDER_SITE_OTHER): Payer: Managed Care, Other (non HMO) | Admitting: Cardiovascular Disease

## 2012-02-06 VITALS — BP 156/100 | HR 86 | Resp 18 | Ht 60.0 in | Wt 213.8 lb

## 2012-02-06 DIAGNOSIS — E1139 Type 2 diabetes mellitus with other diabetic ophthalmic complication: Secondary | ICD-10-CM

## 2012-02-06 DIAGNOSIS — E785 Hyperlipidemia, unspecified: Secondary | ICD-10-CM

## 2012-02-06 DIAGNOSIS — I447 Left bundle-branch block, unspecified: Secondary | ICD-10-CM

## 2012-02-06 DIAGNOSIS — I10 Essential (primary) hypertension: Secondary | ICD-10-CM

## 2012-02-06 NOTE — Assessment & Plan Note (Signed)
Cholesterol is at goal.  Continue current dose of statin and diet Rx.  No myalgias or side effects.  F/U  LFT's in 6 months. Lab Results  Component Value Date   LDLCALC 58 03/21/2011

## 2012-02-06 NOTE — Assessment & Plan Note (Signed)
Echo reassuring abnormal septal motion and mildly decreased EF from BBB.  Given longstanding DM and dyspnea with exertion will order adenosine myovue

## 2012-02-06 NOTE — Assessment & Plan Note (Signed)
Well controlled.  Continue current medications and low sodium Dash type diet.    

## 2012-02-06 NOTE — Patient Instructions (Addendum)
Your physician has requested that you have an adenosine myoview. For further information please visit [email or web address] Please follow instruction sheet, as given.

## 2012-02-06 NOTE — Progress Notes (Signed)
Patient ID: Sandra [name], female   DOB: July 24, 1965, 47 y.o.   MRN: [id number] 48 yo with longstanding HTN, DM, and elevated lipids.  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  No ECG;s from 2012 available.  She is overweight and has exertional dyspnea She had an echo 01/24/12 with EF 50-55% and mild LVH.  She has not had a stress test.  No chest pain but exertional dyspnea worse over last year.  Compliant with meds.  Had significant headache during interveiw with some photophobia.    ROS: Denies fever, malais, weight loss, blurry vision, decreased visual acuity, cough, sputum, SOB, hemoptysis, pleuritic pain, palpitaitons, heartburn, abdominal pain, melena, lower extremity edema, claudication, or rash.  All other systems reviewed and negative   General: Affect appropriate Overweight black female HEENT: normal Neck supple with no adenopathy JVP normal no bruits no thyromegaly Lungs clear with no wheezing and good diaphragmatic motion Heart:  S1/S2 no murmur,rub, gallop or click PMI normal Abdomen: benighn, BS positve, no tenderness, no AAA no bruit.  No HSM or HJR Distal pulses intact with no bruits No edema Neuro non-focal Skin warm and dry No muscular weakness  Medications Current Outpatient Prescriptions  Medication Sig Dispense Refill  . atorvastatin (LIPITOR) 10 MG tablet Take 1/2 tablet by mouth once a night.  45 tablet  3  . eletriptan (RELPAX) 20 MG tablet One tablet by mouth at onset of headache. May repeat in 2 hours if headache persists or recurs. Repeat only once if needed.  18 tablet  1  . glipiZIDE (GLUCOTROL) 10 MG tablet Take 1 tablet (10 mg total) by mouth 2 (two) times daily before a meal.  180 tablet  1  . metFORMIN (GLUCOPHAGE) 1000 MG tablet Take 1 tablet (1,000 mg total) by mouth 2 (two) times daily with a meal.  60 tablet  11  . propranolol (INDERAL) 20 MG tablet Take 1 tablet (20 mg total) by mouth 2 (two) times daily.  60 tablet  1  . quinapril (ACCUPRIL) 10 MG tablet  Take 1 tablet (10 mg total) by mouth at bedtime.  90 tablet  0  . sitaGLIPtin (JANUVIA) 100 MG tablet Take 1 tablet (100 mg total) by mouth daily.  90 tablet  1    Allergies Review of patient's allergies indicates no known allergies.  Family History: No family history on file.  Social History: History   Social History  . Marital Status: Married    Spouse Name: N/A    Number of Children: N/A  . Years of Education: N/A   Occupational History  . Daycare teacher    Social History Main Topics  . Smoking status: Never Smoker   . Smokeless tobacco: Never Used  . Alcohol Use: No  . Drug Use: No  . Sexually Active: Not on file   Other Topics Concern  . Not on file   Social History Narrative   Studying at [employer] in early childhood education.Married.    Electrocardiogram:  12/16 and 01/24/11 both show LVH with IVCD of LBBB morphology  Assessment and Plan

## 2012-02-06 NOTE — Assessment & Plan Note (Signed)
Discussed low carb diet.  Target hemoglobin A1c is 6.5 or less.  Continue current medications.  

## 2012-02-18 ENCOUNTER — Encounter: Payer: Self-pay | Admitting: Internal Medicine

## 2012-02-18 ENCOUNTER — Ambulatory Visit (INDEPENDENT_AMBULATORY_CARE_PROVIDER_SITE_OTHER): Payer: Managed Care, Other (non HMO) | Admitting: Internal Medicine

## 2012-02-18 ENCOUNTER — Ambulatory Visit (HOSPITAL_COMMUNITY): Payer: Managed Care, Other (non HMO) | Attending: Cardiovascular Disease | Admitting: Radiology

## 2012-02-18 VITALS — BP 136/80 | Ht 60.0 in | Wt 210.0 lb

## 2012-02-18 VITALS — BP 120/77 | HR 109 | Temp 97.6°F | Ht 60.0 in | Wt 216.2 lb

## 2012-02-18 DIAGNOSIS — N924 Excessive bleeding in the premenopausal period: Secondary | ICD-10-CM

## 2012-02-18 DIAGNOSIS — I10 Essential (primary) hypertension: Secondary | ICD-10-CM | POA: Insufficient documentation

## 2012-02-18 DIAGNOSIS — R002 Palpitations: Secondary | ICD-10-CM | POA: Insufficient documentation

## 2012-02-18 DIAGNOSIS — J45909 Unspecified asthma, uncomplicated: Secondary | ICD-10-CM | POA: Insufficient documentation

## 2012-02-18 DIAGNOSIS — E1139 Type 2 diabetes mellitus with other diabetic ophthalmic complication: Secondary | ICD-10-CM

## 2012-02-18 DIAGNOSIS — I447 Left bundle-branch block, unspecified: Secondary | ICD-10-CM | POA: Insufficient documentation

## 2012-02-18 DIAGNOSIS — Z79899 Other long term (current) drug therapy: Secondary | ICD-10-CM

## 2012-02-18 DIAGNOSIS — R0989 Other specified symptoms and signs involving the circulatory and respiratory systems: Secondary | ICD-10-CM | POA: Insufficient documentation

## 2012-02-18 DIAGNOSIS — E119 Type 2 diabetes mellitus without complications: Secondary | ICD-10-CM | POA: Insufficient documentation

## 2012-02-18 DIAGNOSIS — R0609 Other forms of dyspnea: Secondary | ICD-10-CM | POA: Insufficient documentation

## 2012-02-18 DIAGNOSIS — E11319 Type 2 diabetes mellitus with unspecified diabetic retinopathy without macular edema: Secondary | ICD-10-CM

## 2012-02-18 DIAGNOSIS — R079 Chest pain, unspecified: Secondary | ICD-10-CM | POA: Insufficient documentation

## 2012-02-18 LAB — GLUCOSE, CAPILLARY: Glucose-Capillary: 291 mg/dL — ABNORMAL HIGH (ref 70–99)

## 2012-02-18 LAB — POCT GLYCOSYLATED HEMOGLOBIN (HGB A1C): Hemoglobin A1C: 8.6

## 2012-02-18 MED ORDER — INSULIN GLARGINE 100 UNIT/ML ~~LOC~~ SOLN: 5.0000 [IU] | Freq: Every day | SUBCUTANEOUS | Status: DC

## 2012-02-18 MED ORDER — INSULIN PEN NEEDLE 32G X 4 MM MISC
Status: DC
Start: 2012-02-18 — End: 2012-02-22

## 2012-02-18 MED ORDER — TECHNETIUM TC 99M SESTAMIBI GENERIC - CARDIOLITE
33.0000 | Freq: Once | INTRAVENOUS | Status: AC | PRN
  Administered 2012-02-18: 33 via INTRAVENOUS

## 2012-02-18 MED ORDER — ADENOSINE (DIAGNOSTIC) 3 MG/ML IV SOLN
0.5600 mg/kg | Freq: Once | INTRAVENOUS | Status: AC
  Administered 2012-02-18: 53.4 mg via INTRAVENOUS

## 2012-02-18 MED ORDER — TECHNETIUM TC 99M SESTAMIBI GENERIC - CARDIOLITE
11.0000 | Freq: Once | INTRAVENOUS | Status: AC | PRN
  Administered 2012-02-18: 11 via INTRAVENOUS

## 2012-02-18 NOTE — Progress Notes (Signed)
Internal Medicine Clinic Visit    HPI:  Sandra [name] is a 47 y.o. year old female history of type 2 diabetes, asthma, migraines, hypertension, who presents for follow up.  Menstrual cramps, heavy bleeding 5 days, uses pads. Changes pads every 2 hours the first few days. Cramps so bad she can't work. Feels like they have gotten worse over the past 5-6 years. Wants to know if she has fibroids. No breakthrough bleeding. Tubes tied, no birth control. Still has regular cycles.  Usually takes 3-4 midols every 3-4 hours. Used to take hydrocodone. Has cramps for the full 4-5 days.  Diabetes: Has not been checking blood sugars even though she agreed to this at the last visit. She also states that she has not been following a diabetic diet. She has been taking metformin and januvia, but hasn't taken anything today bc of stress test which she had this morning. Has not been making any other changes since last visit.   Has been taking aspirin everyday.  Denies chest pain, no SOB, fever, chills, no polyuria or polydipsia. No changes in vision.   Past Medical History  Diagnosis Date  . Asthma   . Diabetes mellitus type 2, uncontrolled DX: 1999    Started as gestational diabetes  . HSV (herpes simplex virus) anogenital infection   . Migraine     Normal CT head (08/06/2006)  . Carpal tunnel syndrome   . ESSENTIAL HYPERTENSION 07/08/2006  . HYPERLIPIDEMIA 08/20/2007    Past Surgical History  Procedure Date  . Cholecystectomy 2009     ROS:  A complete review of systems was otherwise negative, except as noted in the HPI.  Allergies: Review of patient's allergies indicates no known allergies.  Medications: Current Outpatient Prescriptions  Medication Sig Dispense Refill  . atorvastatin (LIPITOR) 10 MG tablet Take 1/2 tablet by mouth once a night.  45 tablet  3  . eletriptan (RELPAX) 20 MG tablet One tablet by mouth at onset of headache. May repeat in 2 hours if headache persists or recurs.  Repeat only once if needed.  18 tablet  1  . glipiZIDE (GLUCOTROL) 10 MG tablet Take 1 tablet (10 mg total) by mouth 2 (two) times daily before a meal.  180 tablet  1  . metFORMIN (GLUCOPHAGE) 1000 MG tablet Take 1 tablet (1,000 mg total) by mouth 2 (two) times daily with a meal.  60 tablet  11  . propranolol (INDERAL) 20 MG tablet Take 1 tablet (20 mg total) by mouth 2 (two) times daily.  60 tablet  1  . quinapril (ACCUPRIL) 10 MG tablet Take 1 tablet (10 mg total) by mouth at bedtime.  90 tablet  0  . sitaGLIPtin (JANUVIA) 100 MG tablet Take 1 tablet (100 mg total) by mouth daily.  90 tablet  1    History   Social History  . Marital Status: Married    Spouse Name: N/A    Number of Children: N/A  . Years of Education: N/A   Occupational History  . Daycare teacher    Social History Main Topics  . Smoking status: Never Smoker   . Smokeless tobacco: Never Used  . Alcohol Use: No  . Drug Use: No  . Sexually Active: Not on file   Other Topics Concern  . Not on file   Social History Narrative   Studying at [employer] in early childhood education.Married.    family history is not on file.  Physical Exam Blood pressure 120/77, pulse 109,  temperature 97.6 F (36.4 C), temperature source Oral, height 5' (1.524 m), weight 216 lb 3.2 oz (98.068 kg), SpO2 96.00%. General:  No acute distress, obese, alert and oriented x 3, well-appearing AAF HEENT:  PERRL, EOMI, no lymphadenopathy, moist mucous membranes Cardiovascular:  Regular rate and rhythm, no murmurs, rubs or gallops Respiratory:  Clear to auscultation bilaterally, no wheezes, rales, or rhonchi Abdomen:  Soft, obese, nondistended, nontender, normoactive bowel sounds Extremities:  Warm and well-perfused, no clubbing, cyanosis, or edema.  Skin: Warm, dry, no rashes Neuro: Not anxious appearing, no depressed mood, normal affect  Labs: Lab Results  Component Value Date   CREATININE 0.70 01/23/2012   BUN 22 01/23/2012   NA 137 01/23/2012     K 4.6 01/23/2012   CL 102 01/23/2012   CO2 23 01/23/2012   Lab Results  Component Value Date   WBC 12.0* 01/23/2012   HGB 13.3 01/23/2012   HCT 39.0 01/23/2012   MCV 92.7 01/23/2012   PLT 289 01/23/2012      Assessment and Plan:    FOLLOWUP: Sandra [name] will follow back up in our clinic in approximately 1-2 months. Sandra [name] knows to call out clinic in the meantime with any questions or new issues.

## 2012-02-18 NOTE — Progress Notes (Signed)
[facility] SITE 3 NUCLEAR MED [address] [place], [place] [address] [phone]    Cardiology Nuclear Med Study  Sandra [name] is a 47 y.o. female     MRN : [id number]     DOB: [birth year]  Procedure Date: 02/18/2012  Nuclear Med Background Indication for Stress Test:  Evaluation for Ischemia History:  Asthma and 01/24/12 ECHO: EF: 50-55%  Cardiac Risk Factors: Hypertension, LBBB and NIDDM  Symptoms:  Chest Pain, Dizziness, DOE, Fatigue, Fatigue with Exertion, Light-Headedness, Nausea, Palpitations, Rapid HR and SOB   Nuclear Pre-Procedure Caffeine/Decaff Intake:  None > 12 hrs NPO After: 10:00pm   Lungs: clear O2 Sat: 95% on room air. IV 0.9% NS with Angio Cath:  20g  IV Site: R Antecubital x 1, tolerated well IV Started by:  Irean Hong, RN  Chest Size (in):  44 Cup Size: DD  Height: 5' (1.524 m)  Weight:  210 lb (95.255 kg)  BMI:  Body mass index is 41.01 kg/(m^2). Tech Comments: No medications today; took Inderal last night    Nuclear Med Study 1 or 2 day study: 1 day  Stress Test Type:  Adenosine  Reading MD: Kristeen Miss, MD  Order Authorizing Provider:  Charlton Haws, MD  Resting Radionuclide: Technetium 64m Sestamibi  Resting Radionuclide Dose: 11.0 mCi   Stress Radionuclide:  Technetium 106m Sestamibi  Stress Radionuclide Dose: 33.0 mCi           Stress Protocol Rest HR: 85 Stress HR: 120  Rest BP: 136/80 Stress BP: 150/71  Exercise Time (min): n/a METS: n/a   Predicted Max HR: 175 bpm % Max HR: 68.57 bpm Rate Pressure Product: 02725    Dose of Adenosine (mg):  53.5 Dose of Lexiscan: n/a mg  Dose of Atropine (mg): n/a Dose of Dobutamine: n/a mcg/kg/min (at max HR)  Stress Test Technologist: Milana Na, EMT-P  Nuclear Technologist:  Domenic Polite, CNMT     Rest Procedure:  Myocardial perfusion imaging was performed at rest 45 minutes following the intravenous administration of Technetium 90m Sestamibi. Rest ECG:  NSR-LBBB  Stress Procedure:  The patient received IV adenosine at 140 mcg/kg/min for 4 minutes.  2nd degree AVB with infusion. (+) for cp.Technetium 49m Sestamibi was injected at the 2 minute mark and quantitative spect images were obtained after a 45 minute delay. Stress ECG: No significant change from baseline ECG  QPS Raw Data Images:  There is a breast shadow that accounts for the anterior attenuation. Stress Images:  There is  very mild attenuation of the anterior apical segments with normal uptake in the remaining segments.    Rest Images:  There is  very mild attenuation of the anterior apical segments with normal uptake in the remaining segments. Subtraction (SDS):  No evidence of ischemia. Transient Ischemic Dilatation (Normal <1.22):  1.05 Lung/Heart Ratio (Normal <0.45):  0.37  Quantitative Gated Spect Images QGS EDV:  133 ml QGS ESV:  92 ml  Impression Exercise Capacity:  Adenosine study with no exercise. BP Response:  Normal blood pressure response. Clinical Symptoms:  No significant symptoms noted. ECG Impression:  No significant ST segment change suggestive of ischemia. Comparison with Prior Nuclear Study: No images to compare  Overall Impression:  High risk stress nuclear study.       There is no evidence of ischemia.  There is anterior attenuation that appears to be due to a large breast shadow but I cannot rule out an anterior MI.   The LV  Ejection Fraction is 31% and a previous assessment of EF by echo revealed an EF of 50-55% .   There is severe hypokinesis of the anterior wall with global hypokinesis in the other segments.   Vesta Mixer, Montez Hageman., MD, [facility] 02/18/2012, 5:12 PM Office - [phone] Pager [phone]

## 2012-02-19 ENCOUNTER — Telehealth: Payer: Self-pay | Admitting: Cardiovascular Disease

## 2012-02-19 NOTE — Telephone Encounter (Signed)
Pt rtn call to debra re results of test, pls call [phone]

## 2012-02-19 NOTE — Telephone Encounter (Signed)
lmtcb

## 2012-02-20 DIAGNOSIS — N924 Excessive bleeding in the premenopausal period: Secondary | ICD-10-CM

## 2012-02-20 HISTORY — DX: Excessive bleeding in the premenopausal period: N92.4

## 2012-02-20 MED ORDER — METFORMIN HCL 1000 MG PO TABS: 1000.0000 mg | ORAL_TABLET | Freq: Two times a day (BID) | ORAL | Status: DC

## 2012-02-20 NOTE — Patient Instructions (Signed)
   Treatment Goals:  Goals (1 Years of Data) as of 02/18/2012          02/18/12 02/18/12 02/06/12 01/23/12 01/23/12     Blood Pressure    . Blood Pressure < 140/80  120/77 136/80 156/100 126/72 131/85     Result Component    . HEMOGLOBIN A1C < 7.0  8.6        . LDL CALC < 100            Progress Toward Treatment Goals:  Treatment Goal 02/18/2012  Hemoglobin A1C deteriorated  Blood pressure at goal    Self Care Goals & Plans:  Self Care Goal 02/18/2012  Manage my medications take my medicines as prescribed  Monitor my health keep track of my blood glucose; bring my glucose meter and log to each visit  Eat healthy foods eat foods that are low in salt; eat baked foods instead of fried foods  Be physically active find time in my schedule; take a walk every day    Home Blood Glucose Monitoring 02/18/2012  Check my blood sugar 2 times a day  When to check my blood sugar -     Care Management & Community Referrals:  Referral 01/03/2012  Referrals made for care management support none needed

## 2012-02-20 NOTE — Telephone Encounter (Signed)
Spoke with pt and she will come in to see Dr. Eden Emms at 9:45 on Jan. 31

## 2012-02-20 NOTE — Telephone Encounter (Signed)
Follow-up:    Patient returned Sandra [name]'s call about her test results.  Please call back.

## 2012-02-20 NOTE — Assessment & Plan Note (Signed)
Lab Results  Component Value Date   HGBA1C 8.6 02/18/2012   HGBA1C 8.0 11/05/2011   HGBA1C 8.4 03/21/2011     Assessment:  Diabetes control: fair control  Progress toward A1C goal:  deteriorated  Plan:  Medications:  continue current medications  Home glucose monitoring:   Frequency: 2 times a day   Timing:    Instruction/counseling given: reminded to get eye exam, reminded to bring blood glucose meter & log to each visit, discussed foot care, discussed the need for weight loss and discussed diet  Educational resources provided: brochure  Self management tools provided:    Other plans:   Patient did not bring her meter in today and she has not been checking her sugars at home since last visit. She has also not been following a diabetic diet and states she does not have any plans to start doing so. She appears quite resistant to intervention and she refused to discuss starting insulin after a suggested starting low-dose Lantus at night. Her hemoglobin A1c has deteriorated since last visit. Likely secondary to dietary indiscretions and perhaps to medication noncompliance. She is already taking the maximum amount of oral therapy and insulin would certainly be the next choice in this situation. As patient refused, we will continue to encourage her to make lifestyle changes and prepare her for insulin therapy.  -Would like to refer her to diabetes educator, patient declined -Encouraged patient to check her morning blood sugar at home and bring her meter to next visit -Return to clinic in 2-4 weeks for further discussion

## 2012-02-20 NOTE — Assessment & Plan Note (Signed)
Patient presents with several years of painful heavy menstrual bleeding. She still has regular cycles and is finding that the pain is really her from working. Possibly fibroids.  -Refer to gynecology

## 2012-02-20 NOTE — Telephone Encounter (Signed)
I spoke with pt and reviewed stress test results with her and Dr. Fabio Bering recommendations. She would like to schedule office visit before having cath.  I have offered her appts at 9:45 or 2:30 on February 22, 2011. She will call back today and let us know when she can come in.    Below copied from message from Dr. Eden Emms  ----- Message ----- From: Wendall Stade, MD Sent: 02/19/2012 12:03 PM To: Deliah Goody, RN, Alois Cliche, LPN  Needs heart cath Can f/u with me to discuss or just arrange

## 2012-02-22 ENCOUNTER — Encounter: Payer: Self-pay | Admitting: Cardiovascular Disease

## 2012-02-22 ENCOUNTER — Ambulatory Visit (INDEPENDENT_AMBULATORY_CARE_PROVIDER_SITE_OTHER): Payer: Managed Care, Other (non HMO) | Admitting: Cardiovascular Disease

## 2012-02-22 VITALS — BP 137/83 | HR 97 | Wt 215.0 lb

## 2012-02-22 DIAGNOSIS — I10 Essential (primary) hypertension: Secondary | ICD-10-CM

## 2012-02-22 DIAGNOSIS — E785 Hyperlipidemia, unspecified: Secondary | ICD-10-CM

## 2012-02-22 DIAGNOSIS — R931 Abnormal findings on diagnostic imaging of heart and coronary circulation: Secondary | ICD-10-CM

## 2012-02-22 DIAGNOSIS — I5022 Chronic systolic (congestive) heart failure: Secondary | ICD-10-CM | POA: Insufficient documentation

## 2012-02-22 DIAGNOSIS — R9439 Abnormal result of other cardiovascular function study: Secondary | ICD-10-CM

## 2012-02-22 DIAGNOSIS — I428 Other cardiomyopathies: Secondary | ICD-10-CM

## 2012-02-22 DIAGNOSIS — I429 Cardiomyopathy, unspecified: Secondary | ICD-10-CM

## 2012-02-22 DIAGNOSIS — E1139 Type 2 diabetes mellitus with other diabetic ophthalmic complication: Secondary | ICD-10-CM

## 2012-02-22 HISTORY — DX: Chronic systolic (congestive) heart failure: I50.22

## 2012-02-22 NOTE — Assessment & Plan Note (Signed)
Well controlled.  Continue current medications and low sodium Dash type diet.    

## 2012-02-22 NOTE — Progress Notes (Signed)
Patient ID: Sandra [name], female   DOB: 02/10/1965, 47 y.o.   MRN: [id number] 47 y.o. with longstanding HTN, DM, and elevated lipids. 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 No ECG;s from 2012 available. She is overweight and has exertional dyspnea She had an echo 01/24/12 with EF 50-55% and mild LVH. She has not had a stress test. No chest pain but exertional dyspnea worse over last year. Compliant with meds. Had significant headache during interveiw with some photophobia.   Myovue done 1/28 suggested EF 31%  With breast attenuation vs anterior defect.    She may have a DCM  She has no chest pain  IVCD on ECG  Favor cardiac MRI to quantitate EF  She will need valium to do scan.  If EF low will increase quinapril and inderal   ROS: Denies fever, malais, weight loss, blurry vision, decreased visual acuity, cough, sputum, SOB, hemoptysis, pleuritic pain, palpitaitons, heartburn, abdominal pain, melena, lower extremity edema, claudication, or rash.  All other systems reviewed and negative  General: Affect appropriate Obese black female HEENT: normal Neck supple with no adenopathy JVP normal no bruits no thyromegaly Lungs clear with no wheezing and good diaphragmatic motion Heart:  S1/S2 no murmur, no rub, gallop or click PMI normal Abdomen: benighn, BS positve, no tenderness, no AAA no bruit.  No HSM or HJR Distal pulses intact with no bruits No edema Neuro non-focal Skin warm and dry No muscular weakness   Current Outpatient Prescriptions  Medication Sig Dispense Refill  . atorvastatin (LIPITOR) 10 MG tablet Take 1/2 tablet by mouth once a night.  45 tablet  3  . eletriptan (RELPAX) 20 MG tablet One tablet by mouth at onset of headache. May repeat in 2 hours if headache persists or recurs. Repeat only once if needed.  18 tablet  1  . glipiZIDE (GLUCOTROL) 10 MG tablet Take 1 tablet (10 mg total) by mouth 2 (two) times daily before a meal.  180 tablet  1  . metFORMIN (GLUCOPHAGE) 1000 MG tablet  Take 1 tablet (1,000 mg total) by mouth 2 (two) times daily with a meal.  60 tablet  11  . propranolol (INDERAL) 20 MG tablet Take 1 tablet (20 mg total) by mouth 2 (two) times daily.  60 tablet  1  . quinapril (ACCUPRIL) 10 MG tablet Take 1 tablet (10 mg total) by mouth at bedtime.  90 tablet  0  . sitaGLIPtin (JANUVIA) 100 MG tablet Take 1 tablet (100 mg total) by mouth daily.  90 tablet  1    Allergies  Review of patient's allergies indicates no known allergies.  Electrocardiogram:  SR IVCD  Assessment and Plan

## 2012-02-22 NOTE — Assessment & Plan Note (Signed)
Cholesterol is at goal.  Continue current dose of statin and diet Rx.  No myalgias or side effects.  F/U  LFT's in 6 months. Lab Results  Component Value Date   LDLCALC 58 03/21/2011             

## 2012-02-22 NOTE — Assessment & Plan Note (Signed)
Will order cardiac MRI no evidence of ischemia.  ? DCM  EF 50-55% by echo suggested lower by nuclear/  Increase inderal and quinapril if EF is low

## 2012-02-22 NOTE — Patient Instructions (Signed)
Your physician wants you to follow-up in:   6 MONTHS WITH DR Haywood Filler will receive a reminder letter in the mail two months in advance. If you don't receive a letter, please call our office to schedule the follow-up appointment.  Your physician recommends that you continue on your current medications as directed. Please refer to the Current Medication list given to you today.   DAY OF MRI  TAKE  VALIUM 5 MG 2 TABS 30 MIN BEFORE  TEST Your physician has requested that you have a cardiac MRI. Cardiac MRI uses a computer to create images of your heart as its beating, producing both still and moving pictures of your heart and major blood vessels. For further information please visit [email or web address]. Please follow the instruction sheet given to you today for more information.

## 2012-02-22 NOTE — Assessment & Plan Note (Signed)
Discussed low carb diet.  Target hemoglobin A1c is 6.5 or less.  Continue current medications.  

## 2012-02-25 ENCOUNTER — Telehealth: Payer: Self-pay | Admitting: Cardiovascular Disease

## 2012-02-25 NOTE — Telephone Encounter (Signed)
Pt called this am to see when her cardiac MRI was scheduled, Marlowe Kays does not have that order, can one be sent to her so she can get this pt scheduled, pt very upset, wants set up asap

## 2012-02-25 NOTE — Telephone Encounter (Signed)
SPOKE WITH EXPLAINED TO PT THAT TEST  MAY NOT BE DONE IMMEDIATELY THAT THERE ARE  SEVERAL STEPS THAT HAVE TO BE DONE  CHECK WITH INSURANCE AND ALSO SCHEDULED WHEN MD AVAILABLE PT AWARE WILL GET CALL   WITH  APPT FOR MRI .Sandra [name]

## 2012-02-28 ENCOUNTER — Encounter: Payer: Self-pay | Admitting: Cardiovascular Disease

## 2012-03-10 ENCOUNTER — Ambulatory Visit (HOSPITAL_COMMUNITY)
Admission: RE | Admit: 2012-03-10 | Discharge: 2012-03-10 | Disposition: A | Discharge status: Home / Self Care | Payer: Managed Care, Other (non HMO) | Source: Ambulatory Visit | Attending: Cardiovascular Disease | Admitting: Cardiovascular Disease

## 2012-03-10 DIAGNOSIS — I428 Other cardiomyopathies: Secondary | ICD-10-CM

## 2012-03-10 DIAGNOSIS — I429 Cardiomyopathy, unspecified: Secondary | ICD-10-CM

## 2012-03-10 LAB — CREATININE, SERUM
Creatinine, Ser: 0.65 mg/dL (ref 0.50–1.10)
GFR calc Af Amer: >90 mL/min (ref >90)
GFR calc non Af Amer: >90 mL/min (ref >90)

## 2012-03-10 MED ORDER — GADOBENATE DIMEGLUMINE 529 MG/ML IV SOLN
30.0000 mL | Freq: Once | INTRAVENOUS | Status: AC
  Administered 2012-03-10: 30 mL via INTRAVENOUS

## 2012-03-12 ENCOUNTER — Telehealth: Payer: Self-pay | Admitting: Cardiovascular Disease

## 2012-03-12 NOTE — Telephone Encounter (Signed)
New Problem    Pt called in returning your phone call.

## 2012-03-12 NOTE — Telephone Encounter (Signed)
lmtcb ./cy 

## 2012-03-12 NOTE — Telephone Encounter (Signed)
PT  AWARE OF  CARDIAC  MRI RESULTS ./CY 

## 2012-03-27 ENCOUNTER — Other Ambulatory Visit: Payer: Self-pay | Admitting: Internal Medicine

## 2012-03-31 ENCOUNTER — Other Ambulatory Visit: Payer: Self-pay | Admitting: *Deleted

## 2012-03-31 MED ORDER — GLIPIZIDE 10 MG PO TABS: 10.0000 mg | ORAL_TABLET | Freq: Two times a day (BID) | ORAL | Status: DC

## 2012-03-31 NOTE — Telephone Encounter (Signed)
Rx called in to pharmacy along with comment.

## 2012-04-03 ENCOUNTER — Encounter: Payer: Self-pay | Admitting: Internal Medicine

## 2012-04-03 ENCOUNTER — Ambulatory Visit (INDEPENDENT_AMBULATORY_CARE_PROVIDER_SITE_OTHER): Payer: Managed Care, Other (non HMO) | Admitting: Internal Medicine

## 2012-04-03 VITALS — BP 144/80 | HR 87 | Temp 99.4°F | Ht 60.0 in | Wt 213.9 lb

## 2012-04-03 DIAGNOSIS — N924 Excessive bleeding in the premenopausal period: Secondary | ICD-10-CM

## 2012-04-03 DIAGNOSIS — I10 Essential (primary) hypertension: Secondary | ICD-10-CM

## 2012-04-03 DIAGNOSIS — E1139 Type 2 diabetes mellitus with other diabetic ophthalmic complication: Secondary | ICD-10-CM

## 2012-04-03 MED ORDER — QUINAPRIL HCL 20 MG PO TABS: 20.0000 mg | ORAL_TABLET | Freq: Every day | ORAL | Status: DC

## 2012-04-03 MED ORDER — PROPRANOLOL HCL 20 MG PO TABS: 20.0000 mg | ORAL_TABLET | Freq: Two times a day (BID) | ORAL | Status: DC

## 2012-04-03 NOTE — Patient Instructions (Signed)
1.  Increase the Quinapril to 20 mg daily for your blood pressure.  You can use the 10 mg tablets you have, just take 2 daily until they run out then pick up the new one  2.  The app we talked about is Myfitnesspal.  Use it to help track what you are eating to help with losing weight.    3.  Follow up with your primary doctor in the end of April.

## 2012-04-03 NOTE — Progress Notes (Signed)
Subjective:   Patient ID: WINNONA WARGO female   DOB: 03-Jan-1966 47 y.o.   MRN: [id number]  HPI: Ms.Agam A Mckowen is a 47 y.o. woman who presents to clinic today for follow up on her chronic medical conditions including hypertension and diabetes.  See Problem focused Assessment and Plan for full details of her chronic medical conditions.   She states that she has had problems with heavy bleeding over the last few months.  She states that her periods have become more irregular then they were in the past and that the flow as gotten heavier.  She previously was having flow for 3-5 days with only having to use 2-3 normal flow pads to using a box of super pads almost daily.  She denies any abdominal pain, weight loss, night sweats, abdominal distension, or family history of ovarian and uterine cancer.  She also notes no changes in sexual partner recently or drainage for the vagina.  She is also noting more pain with her menstrual periods.    Past Medical History  Diagnosis Date  . Asthma   . Diabetes mellitus type 2, uncontrolled DX: 1999    Started as gestational diabetes  . HSV (herpes simplex virus) anogenital infection   . Migraine     Normal CT head (08/06/2006)  . Carpal tunnel syndrome   . ESSENTIAL HYPERTENSION 07/08/2006  . HYPERLIPIDEMIA 08/20/2007   Current Outpatient Prescriptions  Medication Sig Dispense Refill  . atorvastatin (LIPITOR) 10 MG tablet Take 1/2 tablet by mouth once a night.  45 tablet  3  . eletriptan (RELPAX) 20 MG tablet One tablet by mouth at onset of headache. May repeat in 2 hours if headache persists or recurs. Repeat only once if needed.  18 tablet  1  . glipiZIDE (GLUCOTROL) 10 MG tablet Take 1 tablet (10 mg total) by mouth 2 (two) times daily before a meal.  180 tablet  1  . metFORMIN (GLUCOPHAGE) 1000 MG tablet Take 1 tablet (1,000 mg total) by mouth 2 (two) times daily with a meal.  60 tablet  11  . propranolol (INDERAL) 20 MG tablet TAKE ONE TABLET BY  MOUTH TWICE DAILY  60 tablet  0  . quinapril (ACCUPRIL) 10 MG tablet Take 1 tablet (10 mg total) by mouth at bedtime.  90 tablet  0  . sitaGLIPtin (JANUVIA) 100 MG tablet Take 1 tablet (100 mg total) by mouth daily.  90 tablet  1   No current facility-administered medications for this visit.   No family history on file. History   Social History  . Marital Status: Married    Spouse Name: N/A    Number of Children: N/A  . Years of Education: N/A   Occupational History  . Daycare teacher    Social History Main Topics  . Smoking status: Never Smoker   . Smokeless tobacco: Never Used  . Alcohol Use: No  . Drug Use: No  . Sexually Active: Not on file   Other Topics Concern  . Not on file   Social History Narrative   Studying at [employer] in early childhood education.   Married.   Review of Systems: A full 12 system ROS is negative except as noted in the HPI and A&P.   Objective:  Physical Exam: Filed Vitals:   04/03/12 1534  BP: 144/80  Pulse: 87  Temp: 99.4 F (37.4 C)  TempSrc: Oral  Height: 5' (1.524 m)  Weight: 213 lb 14.4 oz (97.024 kg)  SpO2: 98%  Constitutional: Vital signs reviewed.  Patient is a well-developed and well-nourished woman in no acute distress and cooperative with exam. Alert and oriented x3.  Head: Normocephalic and atraumatic Ear: TM normal bilaterally Mouth: no erythema or exudates, MMM Eyes: PERRL, EOMI, conjunctivae normal, No scleral icterus.  Neck: Supple, Trachea midline normal ROM, No JVD, mass, thyromegaly, or carotid bruit present.  Cardiovascular: RRR, S1 normal, S2 normal, no MRG, pulses symmetric and intact bilaterally Pulmonary/Chest: CTAB, no wheezes, rales, or rhonchi Abdominal: Soft. Non-tender, non-distended, bowel sounds are normal, no masses, organomegaly, or guarding present.  GU: no CVA tenderness, vaginal exam declined today.  Musculoskeletal: No joint deformities, erythema, or stiffness, ROM full and no  nontender Hematology: no cervical, inginal, or axillary adenopathy.  Neurological: A&O x3, Strength is normal and symmetric bilaterally, cranial nerve II-XII are grossly intact, no focal motor deficit, sensory intact to light touch bilaterally.  Skin: Warm, dry and intact. No rash, cyanosis, or clubbing.  Psychiatric: Normal mood and affect. speech and behavior is normal. Judgment and thought content normal. Cognition and memory are normal.   Assessment & Plan:

## 2012-04-22 ENCOUNTER — Encounter: Payer: Self-pay | Admitting: Obstetrics & Gynecology

## 2012-05-15 ENCOUNTER — Encounter: Payer: Self-pay | Admitting: Obstetrics & Gynecology

## 2012-05-15 ENCOUNTER — Ambulatory Visit (INDEPENDENT_AMBULATORY_CARE_PROVIDER_SITE_OTHER): Payer: No Typology Code available for payment source | Admitting: Obstetrics & Gynecology

## 2012-05-15 ENCOUNTER — Encounter: Payer: Managed Care, Other (non HMO) | Admitting: Internal Medicine

## 2012-05-15 ENCOUNTER — Other Ambulatory Visit (HOSPITAL_COMMUNITY)
Admission: RE | Admit: 2012-05-15 | Discharge: 2012-05-15 | Disposition: A | Discharge status: Home / Self Care | Payer: No Typology Code available for payment source | Source: Ambulatory Visit | Attending: Obstetrics & Gynecology | Admitting: Obstetrics & Gynecology

## 2012-05-15 VITALS — BP 172/92 | HR 104 | Temp 99.5°F | Ht 60.0 in | Wt 214.6 lb

## 2012-05-15 DIAGNOSIS — E669 Obesity, unspecified: Secondary | ICD-10-CM

## 2012-05-15 DIAGNOSIS — N925 Other specified irregular menstruation: Secondary | ICD-10-CM | POA: Insufficient documentation

## 2012-05-15 DIAGNOSIS — N949 Unspecified condition associated with female genital organs and menstrual cycle: Secondary | ICD-10-CM | POA: Insufficient documentation

## 2012-05-15 DIAGNOSIS — N924 Excessive bleeding in the premenopausal period: Secondary | ICD-10-CM

## 2012-05-15 DIAGNOSIS — N938 Other specified abnormal uterine and vaginal bleeding: Secondary | ICD-10-CM | POA: Insufficient documentation

## 2012-05-15 LAB — POCT PREGNANCY, URINE
Preg Test, Ur: NEGATIVE
Preg Test, Ur: NEGATIVE

## 2012-05-15 NOTE — Patient Instructions (Signed)
Dysfunctional Uterine Bleeding  Normally, menstrual periods begin between ages 11 to 17 in young women. A normal menstrual cycle/period may begin every 23 days up to 35 days and lasts from 1 to 7 days. Around 12 to 14 days before your menstrual period starts, ovulation (ovary produces an egg) occurs. When counting the time between menstrual periods, count from the first day of bleeding of the previous period to the first day of bleeding of the next period.  Dysfunctional (abnormal) uterine bleeding is bleeding that is different from a normal menstrual period. Your periods may come earlier or later than usual. They may be lighter, have blood clots or be heavier. You may have bleeding between periods, or you may skip one period or more. You may have bleeding after sexual intercourse, bleeding after menopause, or no menstrual period.  CAUSES   · Pregnancy (normal, miscarriage, tubal).  · IUDs (intrauterine device, birth control).  · Birth control pills.  · Hormone treatment.  · Menopause.  · Infection of the cervix.  · Blood clotting problems.  · Infection of the inside lining of the uterus.  · Endometriosis, inside lining of the uterus growing in the pelvis and other female organs.  · Adhesions (scar tissue) inside the uterus.  · Obesity or severe weight loss.  · Uterine polyps inside the uterus.  · Cancer of the vagina, cervix, or uterus.  · Ovarian cysts or polycystic ovary syndrome.  · Medical problems (diabetes, thyroid disease).  · Uterine fibroids (noncancerous tumor).  · Problems with your female hormones.  · Endometrial hyperplasia, very thick lining and enlarged cells inside of the uterus.  · Medicines that interfere with ovulation.  · Radiation to the pelvis or abdomen.  · Chemotherapy.  DIAGNOSIS   · Your doctor will discuss the history of your menstrual periods, medicines you are taking, changes in your weight, stress in your life, and any medical problems you may have.  · Your doctor will do a physical  and pelvic examination.  · Your doctor may want to perform certain tests to make a diagnosis, such as:  · Pap test.  · Blood tests.  · Cultures for infection.  · CT scan.  · Ultrasound.  · Hysteroscopy.  · Laparoscopy.  · MRI.  · Hysterosalpingography.  · D and C.  · Endometrial biopsy.  TREATMENT   Treatment will depend on the cause of the dysfunctional uterine bleeding (DUB). Treatment may include:  · Observing your menstrual periods for a couple of months.  · Prescribing medicines for medical problems, including:  · Antibiotics.  · Hormones.  · Birth control pills.  · Removing an IUD (intrauterine device, birth control).  · Surgery:  · D and C (scrape and remove tissue from inside the uterus).  · Laparoscopy (examine inside the abdomen with a lighted tube).  · Uterine ablation (destroy lining of the uterus with electrical current, laser, heat, or freezing).  · Hysteroscopy (examine cervix and uterus with a lighted tube).  · Hysterectomy (remove the uterus).  HOME CARE INSTRUCTIONS   · If medicines were prescribed, take exactly as directed. Do not change or switch medicines without consulting your caregiver.  · Long term heavy bleeding may result in iron deficiency. Your caregiver may have prescribed iron pills. They help replace the iron that your body lost from heavy bleeding. Take exactly as directed.  · Do not take aspirin or medicines that contain aspirin one week before or during your menstrual period. Aspirin may make   the bleeding worse.  · If you need to change your sanitary pad or tampon more than once every 2 hours, stay in bed with your feet elevated and a cold pack on your lower abdomen. Rest as much as possible, until the bleeding stops or slows down.  · Eat well-balanced meals. Eat foods high in iron. Examples are:  · Leafy green vegetables.  · Whole-grain breads and cereals.  · Eggs.  · Meat.  · Liver.  · Do not try to lose weight until the abnormal bleeding has stopped and your blood iron level is  back to normal. Do not lift more than ten pounds or do strenuous activities when you are bleeding.  · For a couple of months, make note on your calendar, marking the start and ending of your period, and the type of bleeding (light, medium, heavy, spotting, clots or missed periods). This is for your caregiver to better evaluate your problem.  SEEK MEDICAL CARE IF:   · You develop nausea (feeling sick to your stomach) and vomiting, dizziness, or diarrhea while you are taking your medicine.  · You are getting lightheaded or weak.  · You have any problems that may be related to the medicine you are taking.  · You develop pain with your DUB.  · You want to remove your IUD.  · You want to stop or change your birth control pills or hormones.  · You have any type of abnormal bleeding mentioned above.  · You are over 16 years old and have not had a menstrual period yet.  · You are 47 years old and you are still having menstrual periods.  · You have any of the symptoms mentioned above.  · You develop a rash.  SEEK IMMEDIATE MEDICAL CARE IF:   · An oral temperature above 102° F (38.9° C) develops.  · You develop chills.  · You are changing your sanitary pad or tampon more than once an hour.  · You develop abdominal pain.  · You pass out or faint.  Document Released: 01/06/2000 Document Revised: 04/02/2011 Document Reviewed: 12/07/2008  ExitCare® Patient Information ©2013 [employer].

## 2012-05-15 NOTE — Progress Notes (Signed)
Here because of heavy periods for the last year. States has to change big pads every hour and period lasts 5-7 days with last few days not as heavy. C/o bad cramps also.

## 2012-05-16 ENCOUNTER — Encounter: Payer: Self-pay | Admitting: Obstetrics & Gynecology

## 2012-05-16 DIAGNOSIS — E669 Obesity, unspecified: Secondary | ICD-10-CM | POA: Insufficient documentation

## 2012-05-16 LAB — CBC
HCT: 36.3 % (ref 36.0–46.0)
Hemoglobin: 12.2 g/dL (ref 12.0–15.0)
MCH: 31.6 pg (ref 26.0–34.0)
MCHC: 33.6 g/dL (ref 30.0–36.0)
MCV: 94 fL (ref 78.0–100.0)
Platelets: 344 10*3/uL (ref 150–400)
RBC: 3.86 MIL/uL — ABNORMAL LOW (ref 3.87–5.11)
RDW: 13.2 % (ref 11.5–15.5)
WBC: 8.8 10*3/uL (ref 4.0–10.5)

## 2012-05-16 LAB — TSH: TSH: 2.361 u[IU]/mL (ref 0.350–4.500)

## 2012-05-16 LAB — FOLLICLE STIMULATING HORMONE: FSH: 6.3 m[IU]/mL

## 2012-05-16 MED ORDER — MEDROXYPROGESTERONE ACETATE 10 MG PO TABS
20.0000 mg | ORAL_TABLET | Freq: Every day | ORAL | Status: DC
Start: 2012-05-16 — End: 2013-11-18

## 2012-05-16 NOTE — Progress Notes (Signed)
GYNECOLOGY CLINIC PROGRESS NOTE  History:  47 y.o. G2P1010 here today for evaluation of menorrhagia over the past few months.  Denies any symptoms of anemia. No current bleeding.  The following portions of the patient's history were reviewed and updated as appropriate: allergies, current medications, past family history, past medical history, past social history, past surgical history and problem list. Last pap in 2012 was normal, due for another one in 2015.  Review of Systems:  Pertinent items are noted in HPI.  Objective:  Physical Exam BP 172/92  Pulse 104  Temp(Src) 99.5 F (37.5 C)  Ht 5' (1.524 m)  Wt 214 lb 9.6 oz (97.342 kg)  BMI 41.91 kg/m2  LMP 04/10/2012 Gen: NAD Abd: Soft, nontender and nondistended Pelvic: Normal appearing external genitalia; normal appearing vaginal mucosa and cervix.  Normal discharge.  Small uterus, no other palpable masses, no uterine or adnexal tenderness  ENDOMETRIAL BIOPSY     The indications for endometrial biopsy were reviewed.   Risks of the biopsy including cramping, bleeding, infection, uterine perforation, inadequate specimen and need for additional procedures  were discussed. The patient states she understands and agrees to undergo procedure today. Consent was signed. Time out was performed. Urine HCG was negative. A sterile speculum was placed in the patient's vagina and the cervix was prepped with Betadine. A single-toothed tenaculum was placed on the anterior lip of the cervix to stabilize it. The 3 mm pipelle was introduced into the endometrial cavity without difficulty to a depth of 9 cm, and a moderate amount of tissue was obtained and sent to pathology. The instruments were removed from the patient's vagina. Minimal bleeding from the cervix was noted. The patient tolerated the procedure well. Routine post-procedure instructions were given to the patient. The patient will be called with the results and given recommendations for further  management.     Assessment & Plan:  Menorrhagia evaluation labs also ordered, will follow up all results and manage accordingly. Provera ordered as needed for menorrhagia; bleeding precautions reviewed Routine preventative health maintenance measures emphasized

## 2012-05-19 ENCOUNTER — Telehealth: Payer: Self-pay | Admitting: *Deleted

## 2012-05-19 ENCOUNTER — Encounter: Payer: Self-pay | Admitting: *Deleted

## 2012-05-19 NOTE — Telephone Encounter (Signed)
Called pt and was unable to leave message due to the mail box being full.

## 2012-05-19 NOTE — Telephone Encounter (Signed)
Message copied by Jill Side on Mon May 19, 2012  3:33 PM ------      Message from: Jaynie Collins A      Created: Mon May 19, 2012  1:09 PM       Benign endometrial biopsy. Please call to inform patient of results. ------

## 2012-05-21 NOTE — Telephone Encounter (Signed)
Pt was sent certified letter.

## 2012-05-21 NOTE — Telephone Encounter (Signed)
Notes Recorded by Tereso Newcomer, MD on 05/16/2012 at 8:14 AM Normal hemoglobin and hormone levels. Awaiting endometrial biopsy results.

## 2012-05-21 NOTE — Telephone Encounter (Signed)
Called patient, no answer- left message to please give [name] a call back.

## 2012-05-22 ENCOUNTER — Telehealth: Payer: Self-pay | Admitting: *Deleted

## 2012-05-22 NOTE — Telephone Encounter (Signed)
Hally called and left a message she is calling again and that we keep missing each other and to please call her before 3:30 while she is on break. Per chart review we called her to give results but did not reach her and letter has been sent. Called Laurana at 3:10 and unable to leave a message- states voicemail not taking messages.

## 2012-05-23 NOTE — Telephone Encounter (Signed)
Called patient and told her we have been trying to get in touch with her to tell her, her endometrial biopsy results were normal or benign. Patient verbalized understanding and asked she was supposed to get a Rx for a bleeding medicine but never got it- Told patient the Rx was sent to her [facility] pharmacy on [address] Patient verbalized understanding and had no further questions

## 2012-05-28 ENCOUNTER — Encounter: Payer: Self-pay | Admitting: Internal Medicine

## 2012-05-29 ENCOUNTER — Ambulatory Visit (INDEPENDENT_AMBULATORY_CARE_PROVIDER_SITE_OTHER): Payer: No Typology Code available for payment source | Admitting: Internal Medicine

## 2012-05-29 ENCOUNTER — Encounter: Payer: Self-pay | Admitting: Internal Medicine

## 2012-05-29 VITALS — BP 157/88 | HR 94 | Temp 97.8°F | Ht 61.0 in | Wt 215.9 lb

## 2012-05-29 DIAGNOSIS — Z0000 Encounter for general adult medical examination without abnormal findings: Secondary | ICD-10-CM

## 2012-05-29 DIAGNOSIS — Z1239 Encounter for other screening for malignant neoplasm of breast: Secondary | ICD-10-CM

## 2012-05-29 DIAGNOSIS — I10 Essential (primary) hypertension: Secondary | ICD-10-CM

## 2012-05-29 DIAGNOSIS — R51 Headache: Secondary | ICD-10-CM

## 2012-05-29 DIAGNOSIS — E785 Hyperlipidemia, unspecified: Secondary | ICD-10-CM

## 2012-05-29 DIAGNOSIS — E1139 Type 2 diabetes mellitus with other diabetic ophthalmic complication: Secondary | ICD-10-CM

## 2012-05-29 DIAGNOSIS — G43009 Migraine without aura, not intractable, without status migrainosus: Secondary | ICD-10-CM

## 2012-05-29 LAB — LIPID PANEL
Cholesterol: 130 mg/dL (ref 0–200)
HDL: 33 mg/dL — ABNORMAL LOW (ref >39)
LDL Cholesterol: 76 mg/dL (ref 0–99)
Total CHOL/HDL Ratio: 3.9 Ratio
Triglycerides: 105 mg/dL (ref <150)
VLDL: 21 mg/dL (ref 0–40)

## 2012-05-29 LAB — POCT GLYCOSYLATED HEMOGLOBIN (HGB A1C): Hemoglobin A1C: 7.3

## 2012-05-29 LAB — GLUCOSE, CAPILLARY: Glucose-Capillary: 106 mg/dL — ABNORMAL HIGH (ref 70–99)

## 2012-05-29 MED ORDER — ACETAMINOPHEN 325 MG PO TABS
650.0000 mg | ORAL_TABLET | Freq: Once | ORAL | Status: AC
  Administered 2012-05-29: 650 mg via ORAL

## 2012-05-29 MED ORDER — QUINAPRIL HCL 20 MG PO TABS: 20.0000 mg | ORAL_TABLET | Freq: Every day | ORAL | Status: DC

## 2012-05-29 NOTE — Assessment & Plan Note (Signed)
Pertinent Labs: Liver Function Tests:    Component Value Date/Time   AST 14 11/05/2011 1450   ALT 12 11/05/2011 1450   ALKPHOS 80 11/05/2011 1450   BILITOT 0.3 11/05/2011 1450   PROT 6.5 11/05/2011 1450   ALBUMIN 4.0 11/05/2011 1450    Lipid Panel:     Component Value Date/Time   CHOL 112 03/21/2011 1509   TRIG 115 03/21/2011 1509   HDL 31* 03/21/2011 1509   CHOLHDL 3.6 03/21/2011 1509   VLDL 23 03/21/2011 1509   LDLCALC 58 03/21/2011 1509     Assessment: Goal LDL (per ATP guidelines): < 100 mg/dL  Disease Control: controlled  Progress toward goals: at goal  Barriers to meeting goals: no barriers identified    Plan:  continue current medications  Lipid panel today

## 2012-05-29 NOTE — Patient Instructions (Signed)
General Instructions:  Please follow-up at the clinic in 3 months, at which time we will reevaluate your diabetes and blood pressure - OR, please follow-up in the clinic sooner if needed.  There have been changes in your medications:  INCREASE your accupril to 20mg  daily - a new prescription has been sent to the health department.    If you have been started on new medication(s), and you develop symptoms concerning for allergic reaction, including, but not limited to, throat closing, tongue swelling, rash, please stop the medication immediately and call the clinic at [phone], and go to the ER.  If you are diabetic, please bring your meter to your next visit.  If symptoms worsen, or new symptoms arise, please call the clinic or go to the ER.  PLEASE BRING ALL OF YOUR MEDICATIONS  IN A BAG TO YOUR NEXT APPOINTMENT   Treatment Goals:  Goals (1 Years of Data) as of 05/29/12         As of Today 05/15/12 04/03/12 02/22/12 02/18/12     Blood Pressure    . Blood Pressure < 140/80  157/88 172/92 144/80 137/83 120/77     Result Component    . HEMOGLOBIN A1C < 7.0  7.3    8.6    . LDL CALC < 100            Progress Toward Treatment Goals:  Treatment Goal 02/18/2012  Hemoglobin A1C deteriorated  Blood pressure at goal    Self Care Goals & Plans:  Self Care Goal 05/29/2012  Manage my medications take my medicines as prescribed; refill my medications on time  Monitor my health keep track of my blood glucose; check my feet daily  Eat healthy foods eat more vegetables  Be physically active take a walk every day    Home Blood Glucose Monitoring 02/18/2012  Check my blood sugar 2 times a day  When to check my blood sugar -     Care Management & Community Referrals:  Referral 01/03/2012  Referrals made for care management support none needed

## 2012-05-29 NOTE — Assessment & Plan Note (Signed)
Pertinent Data: BP Readings from Last 3 Encounters:  05/29/12 157/88  05/15/12 172/92  04/03/12 144/80    Basic Metabolic Panel:    Component Value Date/Time   NA 137 01/23/2012 0912   K 4.6 01/23/2012 0912   CL 102 01/23/2012 0912   CO2 23 01/23/2012 0843   BUN 22 01/23/2012 0912   CREATININE 0.65 03/10/2012 1218   CREATININE 0.69 11/05/2011 1450   GLUCOSE 250* 01/23/2012 0912   CALCIUM 9.5 01/23/2012 0843    Assessment: Disease Control: mildly elevated  Progress toward goals: unchanged  Barriers to meeting goals: medication confusion. During last visit in 03/2012, she was supposed to increase her accupril - but she did not realize this, so has remained on 10mg  daily instead of 20mg  daily.     She is also missing BP med approximately 1 day or the week if she forgets.    Patient is compliant most of the time with prescribed medications.   Plan:  Escalate accupril to the 20mg  daily.  Continue propranolol at current dosage.  Educational resources provided:    Self management tools provided:

## 2012-05-29 NOTE — Assessment & Plan Note (Signed)
Pertinent Labs: Lab Results  Component Value Date   HGBA1C 7.3 05/29/2012   HGBA1C 12.3 10/11/2009   CREATININE 0.65 03/10/2012   CREATININE 0.69 11/05/2011   MICROALBUR 1.26 03/21/2011   MICRALBCREAT 6.3 03/21/2011   CHOL 112 03/21/2011   HDL 31* 03/21/2011   TRIG 115 03/21/2011    Assessment: Disease Control: fair control  Progress toward goals: improved  Barriers to meeting goals: missing meds about 1 day per week if she forgets    She is having improved BG control, is on multiple maximally dosed medications - with only mildly suboptimal control of her diabetes. I think we can achieve this with lifestyle changes, as she has already instituted.   Plan: Glucometer log was not reviewed today, as pt did not have glucometer available for review.   continue current medications  Lifestyle modification first.   If persistently elevated next visit, may need to consider initiation of insulin  reminded to get eye exam and reminded to bring blood glucose meter & log to each visit  Educational resources provided: brochure  Self management tools provided:    Home glucose monitoring recommendation: once a day before breakfast

## 2012-05-29 NOTE — Progress Notes (Addendum)
Patient: Sandra [name]   MRN: [id number]  DOB: 08-Jan-1966  PCP: Priscella Mann, DO   Subjective:    CC: Follow-up, Diabetes and Headache   HPI: [name] is a 47 y.o. female with a PMHx as outlined below, who presented to clinic today for the following:  1) HTN - Patient does not check blood pressure regularly at home. Currently taking Propranolol 20mg  BID and Accupril 10mg  daily (was supposed to increase to 20mg  during last visit in 03/2012, but she did not realize this change, therefore, did not start this medication). Patient misses doses 1 day x per week on average. denies dizziness, lightheadedness, chest pain, shortness of breath.  does request refills today.  2) HA - Patient describes a > 1 year history of unchanged, intermittent,  bilateral in the frontal area, bilateral in the occipital area. Headaches are occuring 1-2x/ month, with attacks lasting approximately a few hours at a time after using her Relpax and laying down.  Currently, the pain is rated a 5/10 in severity  - this HA started about 2 PM. Factors that precipitated current episode include: patient is aware of none. Aggravating factors: bright light and loud noise, emotional stress, menstrual cycle, alleviating factors: lying in a darkened room and Rx Meds - triptan therapy. denies recent trauma, MVA, falls. Family history: no known family members with significant headaches. Admits to associated light sensitivity and nausea. Denies associated stiff neck, visual disturbance and vomiting.Marland Kitchen   3) DM2, uncontrolled -  Lab Results  Component Value Date   HGBA1C 7.3 05/29/2012   Patient checking blood sugars 2 times a month. Currently taking Metformin 1000mg  BID, Glipizide 20mg  total daily, Januvia 100mg  daily. Patient misses doses 1 day x per week on average.  0 hypoglycemic episodes since last visit. Denies assisted hypoglycemia or recently hospitalizations for either hyper or hypoglycemia. denies  polyuria, polydipsia, nausea, vomiting, diarrhea.    Review of Systems: Per HPI.    Current Outpatient Medications: Medication Sig  . atorvastatin (LIPITOR) 10 MG tablet Take 1/2 tablet by mouth once a night.  . eletriptan (RELPAX) 20 MG tablet One tablet by mouth at onset of headache. May repeat in 2 hours if headache persists or recurs. Repeat only once if needed.  Marland Kitchen glipiZIDE (GLUCOTROL) 10 MG tablet Take 1 tablet (10 mg total) by mouth 2 (two) times daily before a meal.  . medroxyPROGESTERone (PROVERA) 10 MG tablet Take 2 tablets (20 mg total) by mouth daily. Can increase to two tablets twice a day for heavy bleeding  . metFORMIN (GLUCOPHAGE) 1000 MG tablet Take 1 tablet (1,000 mg total) by mouth 2 (two) times daily with a meal.  . propranolol (INDERAL) 20 MG tablet Take 1 tablet (20 mg total) by mouth 2 (two) times daily.  . quinapril (ACCUPRIL) 20 MG tablet Take 1 tablet (20 mg total) by mouth daily.  . sitaGLIPtin (JANUVIA) 100 MG tablet Take 1 tablet (100 mg total) by mouth daily.    Allergies: No Known Allergies   Past Medical History  Diagnosis Date  . Asthma   . Diabetes mellitus type 2, uncontrolled DX: 1999    Started as gestational diabetes  . HSV (herpes simplex virus) anogenital infection   . Migraine     Normal CT head (08/06/2006)  . Carpal tunnel syndrome   . ESSENTIAL HYPERTENSION 07/08/2006  . HYPERLIPIDEMIA 08/20/2007  . Menorrhagia     Objective:    Physical Exam: Filed Vitals:   05/29/12 1511  BP: 157/88  Pulse: 94  Temp: 97.8 F (36.6 C)     General Exam:   General: Vital signs reviewed and noted. Well-developed, well-nourished, mild acute distress 2/2 HA; alert, appropriate and cooperative throughout examination.  Head: Normocephalic, atraumatic.  Eyes: No signs of anemia or jaundince.  Nose: Mucous membranes moist, not inflammed, nonerythematous.  Throat: Oropharynx nonerythematous, no exudate appreciated.   Neck: No deformities, masses,  or tenderness noted. Supple, No carotid Bruits, no JVD.  Lungs:  Normal respiratory effort. Clear to auscultation BL without crackles or wheezes.  Heart: RRR. S1 and S2 normal without gallop, murmur, or rubs.  Abdomen:  BS normoactive. Soft, Nondistended, non-tender.  No masses or organomegaly.  Extremities: No pretibial edema.    Neurologic Exam:   Mental Status: Alert, oriented, thought content appropriate.  Speech fluent without evidence of aphasia. Able to follow 3 step commands without difficulty.  Cranial Nerves:   II: Visual fields grossly intact.  III/IV/VI: Extraocular movements intact.  Pupils reactive bilaterally.  V/VII: Smile symmetric. facial light touch sensation normal bilaterally.  VIII: Grossly intact.  IX/X: Normal gag.  XI: Bilateral shoulder shrug normal.  XII: Midline tongue extension normal.  Motor:  5/5 bilaterally with normal tone and bulk  Sensory:  Light touch intact throughout, bilaterally  Cerebellar: Normal finger-to-nose.    Assessment/ Plan:   The patient's case and plan of care was discussed with attending physician, Dr. Jonah Blue.

## 2012-05-29 NOTE — Assessment & Plan Note (Signed)
Health Maintenance  Topic Date Due  . Ophthalmology Exam  12/25/2008  . Pap Smear  01/02/2011  . Lipid Panel  03/20/2012  . Hemoglobin A1c  08/29/2012  . Influenza Vaccine  09/22/2012  . Foot Exam  04/03/2013  . Pneumococcal Polysaccharide Vaccine (#2) 03/20/2016  . Tetanus/tdap  03/20/2021    Assessment:  Procedures due: mammogram, PAP, eye exam  Labs due: lipid panel, A1c, microalb/cr  Immunizations due: None  Plan:  Referral for mammogram today.  Pt refuses PAP today, plans to get it with her OB/GYN  Labs today.  Will schedule for eye exam next visit.

## 2012-05-29 NOTE — Assessment & Plan Note (Signed)
Assessment: Patient has history of migraine headaches, has been on propranolol for > 1 year at this point, which has cause stabilization of her migraine symptoms - causing her only to have migraines 1-2 x per month, which is a huge improvement from prior.   Today, unfortunately, she has experienced a recurrent migraine and does not have her triptan with her. Symptoms are not severe and she has no neuro deficits noted. She therefore, was administered tylenol during her clinic visit today, and had some stabilization of symptoms. She feels comfortable driving home and wants to go take a nap.   Plan:      Tylenol 650mg  x 1 in clinic today, orally.  Continue propranolol and PRN triptan. The patient was informed of the red flag symptoms that should prompt her immediate return for reevaluation - such as, but not limited to the following: worsening headache symptoms, new neurologic deficits. She verbally expresses understanding of the information provided.

## 2012-05-30 LAB — MICROALBUMIN / CREATININE URINE RATIO
Creatinine, Urine: 36.3 mg/dL
Microalb Creat Ratio: 13.8 mg/g (ref 0.0–30.0)
Microalb, Ur: 0.5 mg/dL (ref 0.00–1.89)

## 2012-05-30 NOTE — Progress Notes (Signed)
Case discussed with Dr. Saralyn Pilar immediately after the resident saw the patient.  We reviewed the resident's history and exam and pertinent patient test results.  I agree with the assessment, diagnosis and plan of care documented in the resident's note.

## 2012-06-02 ENCOUNTER — Ambulatory Visit (HOSPITAL_COMMUNITY)
Admission: RE | Admit: 2012-06-02 | Discharge: 2012-06-02 | Disposition: A | Discharge status: Home / Self Care | Payer: No Typology Code available for payment source | Source: Ambulatory Visit | Attending: Internal Medicine | Admitting: Internal Medicine

## 2012-06-02 ENCOUNTER — Ambulatory Visit (HOSPITAL_COMMUNITY)
Admission: RE | Admit: 2012-06-02 | Discharge: 2012-06-02 | Disposition: A | Discharge status: Home / Self Care | Payer: No Typology Code available for payment source | Source: Ambulatory Visit | Attending: Obstetrics & Gynecology | Admitting: Obstetrics & Gynecology

## 2012-06-02 DIAGNOSIS — N924 Excessive bleeding in the premenopausal period: Secondary | ICD-10-CM

## 2012-06-02 DIAGNOSIS — Z1231 Encounter for screening mammogram for malignant neoplasm of breast: Secondary | ICD-10-CM | POA: Insufficient documentation

## 2012-06-02 DIAGNOSIS — N938 Other specified abnormal uterine and vaginal bleeding: Secondary | ICD-10-CM | POA: Insufficient documentation

## 2012-06-02 DIAGNOSIS — Z1239 Encounter for other screening for malignant neoplasm of breast: Secondary | ICD-10-CM

## 2012-06-02 DIAGNOSIS — N949 Unspecified condition associated with female genital organs and menstrual cycle: Secondary | ICD-10-CM | POA: Insufficient documentation

## 2012-06-02 DIAGNOSIS — N925 Other specified irregular menstruation: Secondary | ICD-10-CM | POA: Insufficient documentation

## 2012-06-02 DIAGNOSIS — E669 Obesity, unspecified: Secondary | ICD-10-CM | POA: Insufficient documentation

## 2012-06-03 ENCOUNTER — Telehealth: Payer: Self-pay | Admitting: *Deleted

## 2012-06-03 ENCOUNTER — Encounter: Payer: Self-pay | Admitting: Obstetrics & Gynecology

## 2012-06-03 DIAGNOSIS — N8003 Adenomyosis of the uterus: Secondary | ICD-10-CM | POA: Insufficient documentation

## 2012-06-03 DIAGNOSIS — N809 Endometriosis, unspecified: Secondary | ICD-10-CM

## 2012-06-03 DIAGNOSIS — N800 Endometriosis of uterus: Secondary | ICD-10-CM | POA: Insufficient documentation

## 2012-06-03 HISTORY — DX: Adenomyosis of the uterus: N80.03

## 2012-06-03 NOTE — Telephone Encounter (Addendum)
Message copied by Jill Side on Tue Jun 03, 2012  3:29 PM ------      Message from: Jaynie Collins A      Created: Tue Jun 03, 2012  2:35 PM       Shows adenomyosis. Benign biopsy.  Patient can make appointment to discuss further management. ------Called pt and left message that I am calling to discuss test results. Please call back and leave message stating if there is another telephone number we should call or if we may leave the results on her voice mail.

## 2012-06-05 NOTE — Telephone Encounter (Signed)
1321- Pt called back and stated that she was returning our call and that we could leave message on VM.  Called and left message that her results are benign but the provider would like for her to call the clinics to schedule an appt for further management.  If she has any questions to please give [name] a call but to scheudule an appt please call clinics and hit the appt line.

## 2012-06-13 ENCOUNTER — Telehealth: Payer: Self-pay | Admitting: *Deleted

## 2012-06-13 NOTE — Telephone Encounter (Signed)
Patient left a message requesting the results of her ultrasound.

## 2012-06-13 NOTE — Telephone Encounter (Signed)
Called pt and left message to return call to the clinics.  

## 2012-06-18 NOTE — Telephone Encounter (Signed)
Spoke to patient and notified of u/s result as requested. She made a f/u appt for a continuous menorrhagia 6/19 @1 :30. Patient satisfied.

## 2012-07-01 NOTE — Assessment & Plan Note (Signed)
She has noted some changes in her menstruation lately with her periods becoming heavier and slightly more irregular.  She likely is entering the perimenopausal area but with the increased bleeding she needs to be evaluated for possible uterine cancer.  She would like to have this evaluated by an OB/GYN.  We will refer her today and follow up for now.

## 2012-07-01 NOTE — Assessment & Plan Note (Signed)
BP Readings from Last 3 Encounters:  04/03/12 144/80  02/22/12 137/83  02/18/12 120/77   She states that she has been taking her medications as prescribed and denies any side effects from the medications including dizziness on standing or syncope.  She also denies chest pain, shortness of breath, or changes in her vision.    We will continue her current medications including quinapril and propranolol.

## 2012-07-01 NOTE — Assessment & Plan Note (Signed)
Lab Results  Component Value Date   HGBA1C 8.6 02/18/2012   HGBA1C 8.0 11/05/2011   Lab Results  Component Value Date   CREATININE 0.65 03/10/2012   She is not due for an A1C today but states that she is taking her medications as prescribed.  She denies signs of hypoglycemia including jitteriness, fatigue, sweats, or confusion.   We will continue her current medication for today including januvia and metformin.  We performed her diabetic foot exam today.

## 2012-07-10 ENCOUNTER — Ambulatory Visit: Payer: No Typology Code available for payment source | Admitting: Obstetrics & Gynecology

## 2012-07-23 ENCOUNTER — Encounter: Payer: Self-pay | Admitting: Obstetrics & Gynecology

## 2012-07-23 ENCOUNTER — Ambulatory Visit (INDEPENDENT_AMBULATORY_CARE_PROVIDER_SITE_OTHER): Payer: No Typology Code available for payment source | Admitting: Obstetrics & Gynecology

## 2012-07-23 VITALS — BP 154/90 | HR 88 | Temp 97.5°F | Resp 20 | Ht 60.0 in | Wt 215.9 lb

## 2012-07-23 DIAGNOSIS — N800 Endometriosis of uterus: Secondary | ICD-10-CM

## 2012-07-23 MED ORDER — IBUPROFEN 800 MG PO TABS: 800.0000 mg | ORAL_TABLET | Freq: Three times a day (TID) | ORAL | Status: DC | PRN

## 2012-07-23 NOTE — Patient Instructions (Signed)
Return to clinic for any scheduled appointments or for any gynecologic concerns as needed.   

## 2012-07-24 ENCOUNTER — Encounter: Payer: Self-pay | Admitting: Obstetrics & Gynecology

## 2012-07-24 NOTE — Progress Notes (Signed)
GYNECOLOGY CLINIC PROGRESS NOTE  History:  47 y.o. [id number] here today for followup of chronic pelvic pain and menorrhagia.  She was seen in 04/2012 and she had a benign endometrial biopsy. Ultrasound was ordered that showed adenomyosis.  Patient wants to discuss management options; feels that the pain is getting worse and she is "tired of bleeding so much".  The following portions of the patient's history were reviewed and updated as appropriate: allergies, current medications, past family history, past medical history, past social history, past surgical history and problem list.  Review of Systems:  Pertinent items mentioned in HPI  Objective:   BP 154/90  Pulse 88  Temp(Src) 97.5 F (36.4 C) (Oral)  Resp 20  Ht 5' (1.524 m)  Wt 215 lb 14.4 oz (97.932 kg)  BMI 42.17 kg/m2 Physical Exam deferred  Labs and Imaging 06/03/12  TRANSABDOMINAL AND TRANSVAGINAL ULTRASOUND OF PELVIS  Clinical Data: Dysfunctional uterine bleeding and obesity. LMP 05/15/2012.  Findings:  Uterus: The uterus is anteverted and measures 9.0 x 4.3 x 4.9 cm. The echotexture of the uterus is somewhat heterogeneous, but there is no focal uterine mass.The junctional zone of the uterus is indistinct.  Endometrium: Normal in thickness and appearance. Measures 4 mm greatest diameter.  Right ovary: Normal appearance/no adnexal mass. Measures 2.5 x 1.8 x 2.9 cm.  Left ovary: Normal appearance/no adnexal mass. Measures 2.5 x 1.3 x 1.5 cm and contains a dominant follicle.  Other findings: No free fluid.  IMPRESSION:  1. Normal endometrial thickness.  2. Heterogeneous uterine echotexture with indistinct junctional zone. Uterine adenomyosis cannot be excluded.  3. Normal ovaries.    Assessment & Plan:  Given her pain and menorrhagia, discussed several options including oral progesterone, Depo Provera, Mirena IUD, endometrial ablation (Novasure/Hydrothermal Ablation) or hysterectomy as definitive surgical management.   Discussed risks and benefits of each method.  Patient desires oral Provera for now.  She was informed that pain secondary to adenomyosis may not be ameliorated with hormonal therapy; she may need hysterectomy.  She is hesitant to undergo surgery for now.  Printed patient education handouts were given to the patient to review at home.  Bleeding and pain precautions reviewed. Routine preventative health maintenance measures emphasized.

## 2012-07-31 ENCOUNTER — Other Ambulatory Visit: Payer: Self-pay | Admitting: *Deleted

## 2012-07-31 ENCOUNTER — Other Ambulatory Visit: Payer: Self-pay

## 2012-07-31 DIAGNOSIS — E1139 Type 2 diabetes mellitus with other diabetic ophthalmic complication: Secondary | ICD-10-CM

## 2012-07-31 MED ORDER — SITAGLIPTIN PHOSPHATE 100 MG PO TABS: 100.0000 mg | ORAL_TABLET | Freq: Every day | ORAL | Status: DC

## 2012-08-05 NOTE — Telephone Encounter (Signed)
Called to pharm 

## 2012-08-12 ENCOUNTER — Other Ambulatory Visit: Payer: Self-pay | Admitting: *Deleted

## 2012-08-12 DIAGNOSIS — I10 Essential (primary) hypertension: Secondary | ICD-10-CM

## 2012-08-12 DIAGNOSIS — E785 Hyperlipidemia, unspecified: Secondary | ICD-10-CM

## 2012-08-12 MED ORDER — ATORVASTATIN CALCIUM 10 MG PO TABS: ORAL_TABLET | ORAL | Status: DC

## 2012-08-12 MED ORDER — QUINAPRIL HCL 20 MG PO TABS: 20.0000 mg | ORAL_TABLET | Freq: Every day | ORAL | Status: DC

## 2012-08-12 NOTE — Telephone Encounter (Signed)
Pls sch appt mid late Aug with PCP Thanks BP F/u

## 2012-08-13 ENCOUNTER — Encounter: Payer: Self-pay | Admitting: Internal Medicine

## 2012-08-29 ENCOUNTER — Other Ambulatory Visit: Payer: Self-pay | Admitting: *Deleted

## 2012-08-29 DIAGNOSIS — G43009 Migraine without aura, not intractable, without status migrainosus: Secondary | ICD-10-CM

## 2012-09-02 ENCOUNTER — Other Ambulatory Visit: Payer: Self-pay | Admitting: Internal Medicine

## 2012-09-02 DIAGNOSIS — G43009 Migraine without aura, not intractable, without status migrainosus: Secondary | ICD-10-CM

## 2012-09-02 MED ORDER — ELETRIPTAN HYDROBROMIDE 20 MG PO TABS: 20.0000 mg | ORAL_TABLET | ORAL | Status: DC | PRN

## 2012-11-26 ENCOUNTER — Encounter: Payer: Self-pay | Admitting: Internal Medicine

## 2012-12-03 ENCOUNTER — Ambulatory Visit (INDEPENDENT_AMBULATORY_CARE_PROVIDER_SITE_OTHER): Payer: No Typology Code available for payment source | Admitting: Internal Medicine

## 2012-12-03 ENCOUNTER — Encounter: Payer: Self-pay | Admitting: Internal Medicine

## 2012-12-03 VITALS — BP 149/86 | HR 85 | Temp 98.7°F | Ht 60.0 in | Wt 222.9 lb

## 2012-12-03 DIAGNOSIS — I10 Essential (primary) hypertension: Secondary | ICD-10-CM

## 2012-12-03 DIAGNOSIS — E1139 Type 2 diabetes mellitus with other diabetic ophthalmic complication: Secondary | ICD-10-CM

## 2012-12-03 DIAGNOSIS — E785 Hyperlipidemia, unspecified: Secondary | ICD-10-CM

## 2012-12-03 LAB — POCT GLYCOSYLATED HEMOGLOBIN (HGB A1C): Hemoglobin A1C: 7.8

## 2012-12-03 LAB — GLUCOSE, CAPILLARY: Glucose-Capillary: 136 mg/dL — ABNORMAL HIGH (ref 70–99)

## 2012-12-03 NOTE — Progress Notes (Signed)
  Subjective:    Patient ID: Sandra [name], female    DOB: 08/15/1965, 47 y.o.   MRN: [id number]  HPI  Presents today for f/u hypertension and diabetes.  Pt states that she has missed some doses this week and usually takes her bp meds in the evening thus has not taken the quinapril yet. Currently has a headache.    Review of Systems  Constitutional: Negative for fatigue.  Eyes: Negative.   Respiratory: Negative for chest tightness and shortness of breath.   Cardiovascular: Negative for chest pain and leg swelling.  Gastrointestinal: Negative.   Musculoskeletal: Negative.   Neurological: Positive for headaches. Negative for dizziness and weakness.  Hematological: Negative.   Psychiatric/Behavioral: Negative.        Objective:   Physical Exam  Constitutional: She is oriented to person, place, and time. She appears well-developed and well-nourished. She appears distressed.  headache  HENT:  Head: Normocephalic and atraumatic.  Eyes: Conjunctivae and EOM are normal. Pupils are equal, round, and reactive to light.  Neck: Neck supple. No thyromegaly present.  Cardiovascular: Normal rate, regular rhythm, normal heart sounds and intact distal pulses.   No murmur heard. Pulmonary/Chest: Effort normal and breath sounds normal.  Abdominal: Soft. Bowel sounds are normal. There is no tenderness.  Obese   Musculoskeletal: She exhibits no edema and no tenderness.  Neurological: She is alert and oriented to person, place, and time.  Skin: Skin is warm and dry.  Psychiatric: She has a normal mood and affect.          Assessment & Plan:  See separate promblem based charting:  #1 hypertension: slightly above goal, pt has not taken bp meds today -start taking quinapril in the morning -recheck bp in 1-2 weeks  #2 DM Type 2: HgbA1c slightly elevated at 7.8 from 7.2 on metformin 1000 mg bid and glipizide 10 mg qd -discussed likely need for insulin tx if cannot get better control with  current agents, diet and exercise  #3 Headaches: resume propanolol and Ibuprofen prn -will need therapeutic dosage of propanolol -address titration upward at PCP f/u  #4 Preventative Care: declined flu shot

## 2012-12-03 NOTE — Assessment & Plan Note (Signed)
At goal, cont atorvastatin 10 mg qd Lipid Panel     Component Value Date/Time   CHOL 130 05/29/2012 1512   TRIG 105 05/29/2012 1512   HDL 33* 05/29/2012 1512   CHOLHDL 3.9 05/29/2012 1512   VLDL 21 05/29/2012 1512   LDLCALC 76 05/29/2012 1512

## 2012-12-03 NOTE — Assessment & Plan Note (Addendum)
Lab Results  Component Value Date   HGBA1C 7.8 12/03/2012   HGBA1C 7.3 05/29/2012   HGBA1C 8.6 02/18/2012     Assessment: Diabetes control: fair control Progress toward A1C goal:  unchanged Comments: reports compliance with glypizide and metformin  Plan: Medications:  continue current medications Home glucose monitoring: Frequency: no home glucose monitoring Timing:   Instruction/counseling given: reminded to get eye exam Educational resources provided: brochure Self management tools provided: other (see comments) (booklet) Other plans: will need to consider staring insulin therapy, could increase metformin but likely will have increased side effects, discuss with PCP at f/u

## 2012-12-03 NOTE — Assessment & Plan Note (Signed)
BP Readings from Last 3 Encounters:  12/03/12 149/86  07/23/12 154/90  05/29/12 157/88    Lab Results  Component Value Date   NA 137 01/23/2012   K 4.6 01/23/2012   CREATININE 0.65 03/10/2012    Assessment: Blood pressure control: mildly elevated Progress toward BP goal:  improved Comments: hasnt had meds today  Plan: Medications:  continue current medications Educational resources provided: handout Self management tools provided: home blood pressure logbook Other plans: advisd to start taking in the morning and take everyday -cont quinapril 20 mg qd

## 2012-12-03 NOTE — Patient Instructions (Addendum)
General Instructions: You will need an eye exam.   Please contact your eye doctor to schedule. Your sugar levels are improved but still elevated. We will need to consider insulin therapy. If you change your mind about the flu shot please let the clinic know. Return to clinic in 1-2 weeks after you have been taking your blood pressure medications in the morning.   Treatment Goals:  Goals (1 Years of Data) as of 12/03/12         As of Today 07/23/12 05/29/12 05/15/12 04/03/12     Blood Pressure    . Blood Pressure < 140/80  149/86 154/90 157/88 172/92 144/80     Result Component    . HEMOGLOBIN A1C < 7.0  7.8  7.3      . LDL CALC < 100    76        Progress Toward Treatment Goals:  Treatment Goal 12/03/2012  Hemoglobin A1C unchanged  Blood pressure improved    Self Care Goals & Plans:  Self Care Goal 12/03/2012  Manage my medications take my medicines as prescribed; bring my medications to every visit; refill my medications on time  Monitor my health keep track of my blood glucose; bring my glucose meter and log to each visit  Eat healthy foods drink diet soda or water instead of juice or soda; eat more vegetables; eat foods that are low in salt  Be physically active -    Home Blood Glucose Monitoring 12/03/2012  Check my blood sugar no home glucose monitoring  When to check my blood sugar -     Care Management & Community Referrals:  Referral 05/29/2012  Referrals made for care management support none needed

## 2012-12-04 ENCOUNTER — Other Ambulatory Visit: Payer: Self-pay | Admitting: *Deleted

## 2012-12-04 DIAGNOSIS — I10 Essential (primary) hypertension: Secondary | ICD-10-CM

## 2012-12-04 DIAGNOSIS — E1139 Type 2 diabetes mellitus with other diabetic ophthalmic complication: Secondary | ICD-10-CM

## 2012-12-04 MED ORDER — METFORMIN HCL 1000 MG PO TABS: 1000.0000 mg | ORAL_TABLET | Freq: Two times a day (BID) | ORAL | Status: DC

## 2012-12-04 MED ORDER — QUINAPRIL HCL 20 MG PO TABS: 20.0000 mg | ORAL_TABLET | Freq: Every day | ORAL | Status: DC

## 2012-12-05 NOTE — Progress Notes (Signed)
Case discussed with Dr. Schooler soon after the resident saw the patient.  We reviewed the resident's history and exam and pertinent patient test results.  I agree with the assessment, diagnosis, and plan of care documented in the resident's note. 

## 2012-12-16 ENCOUNTER — Other Ambulatory Visit: Payer: No Typology Code available for payment source

## 2012-12-16 ENCOUNTER — Encounter: Payer: Self-pay | Admitting: Internal Medicine

## 2012-12-16 ENCOUNTER — Ambulatory Visit (INDEPENDENT_AMBULATORY_CARE_PROVIDER_SITE_OTHER): Payer: No Typology Code available for payment source | Admitting: Internal Medicine

## 2012-12-16 VITALS — BP 148/91 | HR 99 | Temp 98.5°F | Ht 60.0 in | Wt 221.6 lb

## 2012-12-16 DIAGNOSIS — E11319 Type 2 diabetes mellitus with unspecified diabetic retinopathy without macular edema: Secondary | ICD-10-CM

## 2012-12-16 DIAGNOSIS — I447 Left bundle-branch block, unspecified: Secondary | ICD-10-CM

## 2012-12-16 DIAGNOSIS — E1139 Type 2 diabetes mellitus with other diabetic ophthalmic complication: Secondary | ICD-10-CM

## 2012-12-16 DIAGNOSIS — I10 Essential (primary) hypertension: Secondary | ICD-10-CM

## 2012-12-16 LAB — GLUCOSE, CAPILLARY: Glucose-Capillary: 208 mg/dL — ABNORMAL HIGH (ref 70–99)

## 2012-12-16 LAB — HM DIABETES EYE EXAM

## 2012-12-16 MED ORDER — QUINAPRIL-HYDROCHLOROTHIAZIDE 20-12.5 MG PO TABS: 1.0000 | ORAL_TABLET | Freq: Every day | ORAL | Status: DC

## 2012-12-16 NOTE — Assessment & Plan Note (Addendum)
Seen EKG - dec 2013. No complainst of chest pain. Was reffered to cardiology. Pt has had an echo and a Myoview done. Pt was seen by Dr. Eden Emms of [facility] cardiology. Results of myoview- There is no evidence of ischemia. There is anterior attenuation that appears to be due to a large breast shadow but I cannot rule out an anterior MI.  The LV Ejection Fraction is 31% and a previous assessment of EF by echo revealed an EF of 50-55% . There is severe hypokinesis of the anterior wall with global hypokinesis in the other segments.   Plan- Pt advised to continue to follow up with her cardiologist. Advised pt that if she has chest pain, SOB , palpitations she should go to the ED for evaluation.

## 2012-12-16 NOTE — Patient Instructions (Signed)
We have sent a new prescription called accuretic to your pharmacy. You will take it 1ce a day. It has a fluid pill in it. So for the first few days you will be going to the bathroom a lot.  We will also want you to come in to get some lab work done. Please do not forget to do this.  Will will like to see you in 6 weeks to see if your blood pressure is better controlled.

## 2012-12-16 NOTE — Assessment & Plan Note (Signed)
Blood pressure mildly elevated. Last 3 blood pressure readings-   Vitals - 1 value per visit 12/16/2012 12/03/2012 07/23/2012  SYSTOLIC 148 149 454  DIASTOLIC 91 86 90   Last BMP was done- 01/23/2012- Na- 134, K- 4.7, Cr- 0.62.   Plan- BMP check- Lab already closed, pt advised to come back and get it done, pt agreed. - Will add A diuretic- HCT to current regimen-will prescibe Accuretic- 20-12.5mg  dly, and reacess to see if this effective.

## 2012-12-16 NOTE — Progress Notes (Signed)
Patient ID: Sandra [name], female   DOB: 26-Aug-1965, 47 y.o.   MRN: [id number]   Subjective:   Patient ID: Sandra [name] female   DOB: 06-19-1965 47 y.o.   MRN: [id number]  HPI: Sandra [name] is a 47 y.o. with PMH of LBBB, HTN, migraines, asthma, DM 2 with retinopathy, obesity and HLD.  Presented today because her blood pressure had been showing some elevated readings for the past two weeks. Pt claims compliance with her meds- Quinapril- 20mg  dly, and Propanolol- 20mg  BID.  Pt complainst of some headaches, but she has a hx of migraines, which are occasional ocur for which she takes- Eletriptan,but this makes her drowsy, so she mostly takes ibuprofen. She reports that the freqency of her migraines have reduced, but unsure if this related to the propanolol she has been taken. No change in the severity of her migraines, No dizziness, lightheadedness or falls, no change in vision, no leg swelling, no chest pain or difficulty breathing. Complaint with all her other meds.    Past Medical History  Diagnosis Date  . Diabetes mellitus type 2, uncontrolled DX: 1999    Started as gestational diabetes  . HSV (herpes simplex virus) anogenital infection   . Migraine     Normal CT head (08/06/2006)  . ESSENTIAL HYPERTENSION 07/08/2006  . HYPERLIPIDEMIA 08/20/2007  . Menorrhagia   . Asthma     as a child  . Carpal tunnel syndrome    Current Outpatient Prescriptions  Medication Sig Dispense Refill  . atorvastatin (LIPITOR) 10 MG tablet Take 1/2 tablet by mouth once a night.  45 tablet  0  . eletriptan (RELPAX) 20 MG tablet Take 1 tablet (20 mg total) by mouth as needed for migraine (may repeat in 2 hours if necessary). Repeat only once if needed.  18 tablet  2  . glipiZIDE (GLUCOTROL) 10 MG tablet Take 1 tablet (10 mg total) by mouth 2 (two) times daily before a meal.  180 tablet  1  . ibuprofen (ADVIL,MOTRIN) 800 MG tablet Take 1 tablet (800 mg total) by mouth every 8 (eight) hours as needed  for pain.  30 tablet  1  . medroxyPROGESTERone (PROVERA) 10 MG tablet Take 2 tablets (20 mg total) by mouth daily. Can increase to two tablets twice a day for heavy bleeding  30 tablet  2  . metFORMIN (GLUCOPHAGE) 1000 MG tablet Take 1 tablet (1,000 mg total) by mouth 2 (two) times daily with a meal.  60 tablet  11  . propranolol (INDERAL) 20 MG tablet Take 1 tablet (20 mg total) by mouth 2 (two) times daily.  60 tablet  6  . quinapril-hydrochlorothiazide (ACCURETIC) 20-12.5 MG per tablet Take 1 tablet by mouth daily.  30 tablet  2  . sitaGLIPtin (JANUVIA) 100 MG tablet Take 1 tablet (100 mg total) by mouth daily.  90 tablet  0   No current facility-administered medications for this visit.   Family History  Problem Relation Age of Onset  . Hypertension Father   . Diabetes Father    History   Social History  . Marital Status: Married    Spouse Name: N/A    Number of Children: N/A  . Years of Education: N/A   Occupational History  . Daycare teacher    Social History Main Topics  . Smoking status: Never Smoker   . Smokeless tobacco: Never Used  . Alcohol Use: No  . Drug Use: No  . Sexual Activity: Yes  Birth Control/ Protection: Surgical   Other Topics Concern  . None   Social History Narrative   Studying at [employer] in early childhood education.   Married.   Review of Systems: No pertinent findings on ROS.  Objective:  Physical Exam: Filed Vitals:   12/16/12 1539  BP: 148/91  Pulse: 99  Temp: 98.5 F (36.9 C)  TempSrc: Oral  Height: 5' (1.524 m)  Weight: 221 lb 9.6 oz (100.517 kg)  SpO2: 100%   GENERAL- alert, co-operative, appears as stated age, not in any distress. HEENT- Atraumatic, normocephalic, PERRL, EOMI, oral mucosa appears moist, no cervical LN enlargement, thyroid does not appear enlarged. CARDIAC- RRR, no murmurs, rubs or gallops. RESP- Moving equal volumes of air, and clear to auscultation bilaterally. ABDOMEN- Soft,non tender, no palpable masses  or organomegaly, bowel sounds present. BACK- Normal curvature of the spine, No tenderness along the vertebrae, no CVA tenderness. NEURO- No obvious Cr N abnormlaity, strenght equal and present in all extremities. EXTREMITIES- pulse 2+, symmetric. SKIN- Warm, dry, No rash or lesion. PSYCH- Normal mood and affect, appropriate thought content and speech.  Assessment & Plan:  The patient's case and plan of care was discussed with attending physician, Dr. Doreene Adas.  Please see problem based chatting for assessment and plan.

## 2012-12-17 NOTE — Progress Notes (Signed)
I saw and evaluated the patient.  I personally confirmed the key portions of the history and exam documented by Dr. Emokpae and I reviewed pertinent patient test results.  The assessment, diagnosis, and plan were formulated together and I agree with the documentation in the resident's note. 

## 2012-12-22 ENCOUNTER — Ambulatory Visit: Payer: No Typology Code available for payment source

## 2012-12-22 ENCOUNTER — Other Ambulatory Visit (INDEPENDENT_AMBULATORY_CARE_PROVIDER_SITE_OTHER): Payer: No Typology Code available for payment source | Admitting: *Deleted

## 2012-12-22 DIAGNOSIS — Z111 Encounter for screening for respiratory tuberculosis: Secondary | ICD-10-CM

## 2012-12-22 DIAGNOSIS — I10 Essential (primary) hypertension: Secondary | ICD-10-CM

## 2012-12-22 DIAGNOSIS — Z0000 Encounter for general adult medical examination without abnormal findings: Secondary | ICD-10-CM

## 2012-12-22 LAB — BASIC METABOLIC PANEL WITH GFR
BUN: 11 mg/dL (ref 6–23)
CO2: 27 mEq/L (ref 19–32)
Calcium: 9.3 mg/dL (ref 8.4–10.5)
Chloride: 103 mEq/L (ref 96–112)
Creat: 0.71 mg/dL (ref 0.50–1.10)
GFR, Est African American: >89 mL/min
GFR, Est Non African American: >89 mL/min
Glucose, Bld: 179 mg/dL — ABNORMAL HIGH (ref 70–99)
Potassium: 4.4 mEq/L (ref 3.5–5.3)
Sodium: 136 mEq/L (ref 135–145)

## 2012-12-24 ENCOUNTER — Other Ambulatory Visit: Payer: Self-pay | Admitting: Internal Medicine

## 2012-12-24 DIAGNOSIS — E119 Type 2 diabetes mellitus without complications: Secondary | ICD-10-CM

## 2012-12-24 LAB — TB SKIN TEST
Induration: 0 mm
TB Skin Test: NEGATIVE

## 2013-01-08 ENCOUNTER — Telehealth: Payer: Self-pay | Admitting: *Deleted

## 2013-01-08 NOTE — Telephone Encounter (Signed)
Pt called - past 1.5 weeks has green productive cough, back hurts from coughing. Also has noted blood in mucous from nose. Ask if pt had temp unable to take. Just started new job - unable to get off work - works from 7:30AM to 3:30PM. Unable to get to clinic till about 4PM. Offered appt 01/09/13 9:45AM Dr Sherrine Maples - unable to come due to work. Suggest to go to Urgent Care or ER after work. Can call clinic if pt can go while clinic is open. Stanton Kidney Taryn Nave RN 01/08/13 10:30AM

## 2013-01-09 ENCOUNTER — Ambulatory Visit: Payer: No Typology Code available for payment source | Admitting: Internal Medicine

## 2013-01-11 ENCOUNTER — Emergency Department (HOSPITAL_COMMUNITY): Payer: No Typology Code available for payment source

## 2013-01-11 ENCOUNTER — Emergency Department (HOSPITAL_COMMUNITY)
Admission: EM | Admit: 2013-01-11 | Discharge: 2013-01-11 | Disposition: A | Discharge status: Home / Self Care | Payer: No Typology Code available for payment source | Attending: Emergency Medicine | Admitting: Emergency Medicine

## 2013-01-11 ENCOUNTER — Encounter (HOSPITAL_COMMUNITY): Payer: Self-pay | Admitting: Emergency Medicine

## 2013-01-11 DIAGNOSIS — J45909 Unspecified asthma, uncomplicated: Secondary | ICD-10-CM | POA: Insufficient documentation

## 2013-01-11 DIAGNOSIS — I10 Essential (primary) hypertension: Secondary | ICD-10-CM | POA: Insufficient documentation

## 2013-01-11 DIAGNOSIS — T148XXA Other injury of unspecified body region, initial encounter: Secondary | ICD-10-CM

## 2013-01-11 DIAGNOSIS — Z3202 Encounter for pregnancy test, result negative: Secondary | ICD-10-CM | POA: Insufficient documentation

## 2013-01-11 DIAGNOSIS — R059 Cough, unspecified: Secondary | ICD-10-CM | POA: Insufficient documentation

## 2013-01-11 DIAGNOSIS — J3489 Other specified disorders of nose and nasal sinuses: Secondary | ICD-10-CM | POA: Insufficient documentation

## 2013-01-11 DIAGNOSIS — IMO0001 Reserved for inherently not codable concepts without codable children: Secondary | ICD-10-CM | POA: Insufficient documentation

## 2013-01-11 DIAGNOSIS — G43909 Migraine, unspecified, not intractable, without status migrainosus: Secondary | ICD-10-CM | POA: Insufficient documentation

## 2013-01-11 DIAGNOSIS — M549 Dorsalgia, unspecified: Secondary | ICD-10-CM

## 2013-01-11 DIAGNOSIS — E785 Hyperlipidemia, unspecified: Secondary | ICD-10-CM | POA: Insufficient documentation

## 2013-01-11 DIAGNOSIS — Y929 Unspecified place or not applicable: Secondary | ICD-10-CM | POA: Insufficient documentation

## 2013-01-11 DIAGNOSIS — Y939 Activity, unspecified: Secondary | ICD-10-CM | POA: Insufficient documentation

## 2013-01-11 DIAGNOSIS — Z8619 Personal history of other infectious and parasitic diseases: Secondary | ICD-10-CM | POA: Insufficient documentation

## 2013-01-11 DIAGNOSIS — Z8742 Personal history of other diseases of the female genital tract: Secondary | ICD-10-CM | POA: Insufficient documentation

## 2013-01-11 DIAGNOSIS — R05 Cough: Secondary | ICD-10-CM | POA: Insufficient documentation

## 2013-01-11 DIAGNOSIS — X58XXXA Exposure to other specified factors, initial encounter: Secondary | ICD-10-CM | POA: Insufficient documentation

## 2013-01-11 DIAGNOSIS — Z79899 Other long term (current) drug therapy: Secondary | ICD-10-CM | POA: Insufficient documentation

## 2013-01-11 LAB — URINALYSIS, ROUTINE W REFLEX MICROSCOPIC
Bilirubin Urine: NEGATIVE
Glucose, UA: NEGATIVE mg/dL
Hgb urine dipstick: NEGATIVE
Ketones, ur: NEGATIVE mg/dL
Leukocytes, UA: NEGATIVE
Nitrite: NEGATIVE
Protein, ur: NEGATIVE mg/dL
Specific Gravity, Urine: 1.027 (ref 1.005–1.030)
Urobilinogen, UA: 0.2 mg/dL (ref 0.0–1.0)
pH: 5.5 (ref 5.0–8.0)

## 2013-01-11 LAB — PREGNANCY, URINE: Preg Test, Ur: NEGATIVE

## 2013-01-11 MED ORDER — METHOCARBAMOL 500 MG PO TABS: 500.0000 mg | ORAL_TABLET | Freq: Two times a day (BID) | ORAL | Status: DC

## 2013-01-11 MED ORDER — KETOROLAC TROMETHAMINE 60 MG/2ML IM SOLN
60.0000 mg | Freq: Once | INTRAMUSCULAR | Status: AC
  Administered 2013-01-11: 60 mg via INTRAMUSCULAR
  Filled 2013-01-11: qty 2

## 2013-01-11 MED ORDER — IBUPROFEN 800 MG PO TABS: 800.0000 mg | ORAL_TABLET | Freq: Three times a day (TID) | ORAL | Status: DC

## 2013-01-11 NOTE — ED Provider Notes (Signed)
CSN: [id number]     Arrival date & time 01/11/13  0715 History   First MD Initiated Contact with Patient 01/11/13 [phone]     Chief Complaint  Patient presents with  . Back Pain   (Consider location/radiation/quality/duration/timing/severity/associated sxs/prior Treatment) Patient is a 47 y.o. female presenting with back pain. The history is provided by the patient. No language interpreter was used.  Back Pain Associated symptoms: no abdominal pain, no chest pain, no fever and no weakness   Sandra [name] is a 47 year old female with past medical history of diabetes, herpes simplex virus, migraine, hypertension, hyperlipidemia, carpal tunnel syndrome, asthma presenting to emergency department with right-sided back pain that has been ongoing for the past couple days with worsening yesterday. Patient reports that she's been having productive cough, phlegm for the past [redacted] weeks along with congestion. Patient reported that the back pain is an aching, sharp pain is localized to the right side without radiation. Patient reports that the discomfort gets worse with deep inhalation and coughing. Patient reports she's been using over-the-counter medications to control her cough with minimal relief. Reported that she has not been using anything for the pain relief. Denied fevers, chills, nausea, vomiting, abdominal pain, hematuria, dysuria, melena, hematochezia, changes appetite, fall, injuries, urinary and bowel incontinence. PCP Dr. Wendall Stade  Past Medical History  Diagnosis Date  . Diabetes mellitus type 2, uncontrolled DX: 1999    Started as gestational diabetes  . HSV (herpes simplex virus) anogenital infection   . Migraine     Normal CT head (08/06/2006)  . ESSENTIAL HYPERTENSION 07/08/2006  . HYPERLIPIDEMIA 08/20/2007  . Menorrhagia   . Asthma     as a child  . Carpal tunnel syndrome    Past Surgical History  Procedure Laterality Date  . Cholecystectomy  2009  . Tubal ligation     Family  History  Problem Relation Age of Onset  . Hypertension Father   . Diabetes Father    History  Substance Use Topics  . Smoking status: Never Smoker   . Smokeless tobacco: Never Used  . Alcohol Use: No   OB History   Grav Para Term Preterm Abortions TAB SAB Ect Mult Living   2 1 1  1 1    1      Review of Systems  Constitutional: Negative for fever and chills.  HENT: Positive for congestion (nasal). Negative for sore throat and trouble swallowing.   Respiratory: Positive for cough. Negative for chest tightness and shortness of breath.   Cardiovascular: Negative for chest pain.  Gastrointestinal: Negative for nausea, vomiting, abdominal pain and diarrhea.  Musculoskeletal: Positive for back pain. Negative for neck pain.  Neurological: Negative for weakness.  All other systems reviewed and are negative.    Allergies  Review of patient's allergies indicates no known allergies.  Home Medications   Current Outpatient Rx  Name  Route  Sig  Dispense  Refill  . atorvastatin (LIPITOR) 10 MG tablet   Oral   Take 5 mg by mouth every evening.         . Chlorphen-PE-Acetaminophen (CORICIDIN D COLD/FLU/SINUS PO)   Oral   Take 2 tablets by mouth every 6 (six) hours as needed (for cough).         . eletriptan (RELPAX) 20 MG tablet   Oral   Take 1 tablet (20 mg total) by mouth as needed for migraine (may repeat in 2 hours if necessary). Repeat only once if needed.  18 tablet   2   . glipiZIDE (GLUCOTROL) 10 MG tablet   Oral   Take 1 tablet (10 mg total) by mouth 2 (two) times daily before a meal.   180 tablet   1     PLEASE CANCEL EXISTING PRESCRIPTION FOR GLIPIZIDE  .Marland Kitchen.   . ibuprofen (ADVIL,MOTRIN) 800 MG tablet   Oral   Take 1 tablet (800 mg total) by mouth every 8 (eight) hours as needed for pain.   30 tablet   1   . medroxyPROGESTERone (PROVERA) 10 MG tablet   Oral   Take 2 tablets (20 mg total) by mouth daily. Can increase to two tablets twice a day for heavy  bleeding   30 tablet   2   . metFORMIN (GLUCOPHAGE) 1000 MG tablet   Oral   Take 1 tablet (1,000 mg total) by mouth 2 (two) times daily with a meal.   60 tablet   11   . propranolol (INDERAL) 20 MG tablet   Oral   Take 1 tablet (20 mg total) by mouth 2 (two) times daily.   60 tablet   6   . quinapril-hydrochlorothiazide (ACCURETIC) 20-12.5 MG per tablet   Oral   Take 1 tablet by mouth daily.   30 tablet   2   . sitaGLIPtin (JANUVIA) 100 MG tablet   Oral   Take 1 tablet (100 mg total) by mouth daily.   90 tablet   0   . ibuprofen (ADVIL,MOTRIN) 800 MG tablet   Oral   Take 1 tablet (800 mg total) by mouth 3 (three) times daily.   21 tablet   0   . methocarbamol (ROBAXIN) 500 MG tablet   Oral   Take 1 tablet (500 mg total) by mouth 2 (two) times daily.   20 tablet   0    BP 118/63  Pulse 80  Temp(Src) 98.2 F (36.8 C) (Oral)  Resp 19  SpO2 98%  LMP 12/22/2012 Physical Exam  Nursing note and vitals reviewed. Constitutional: She is oriented to person, place, and time. She appears well-developed and well-nourished. No distress.  HENT:  Head: Normocephalic and atraumatic.  Mouth/Throat: Oropharynx is clear and moist. No oropharyngeal exudate.  Eyes: Conjunctivae and EOM are normal. Pupils are equal, round, and reactive to light. Right eye exhibits no discharge. Left eye exhibits no discharge.  Neck: Normal range of motion. Neck supple.  Cardiovascular: Normal rate, regular rhythm and normal heart sounds.   Pulmonary/Chest: Effort normal and breath sounds normal. No respiratory distress. She has no wheezes. She has no rales.  Abdominal:  Obese   Musculoskeletal: Normal range of motion. She exhibits tenderness.       Back:  Full ROM to upper and lower extremities without difficulty noted.  Discomfort upon palpation to the right lumbosacral region - muscular in nature. Stretching sensation with rotation of the torso and pulling the arm to the opposite side.     Lymphadenopathy:    She has no cervical adenopathy.  Neurological: She is alert and oriented to person, place, and time. She exhibits normal muscle tone. Coordination normal.  Cranial nerves III-XII grossly intact Strength 5+/5+ to upper and lower extremities bilaterally with resistance applied, equal distribution noted Fine motor skills intact Heel to knee down shin normal bilaterally Negative saddle anesthesia  Skin: Skin is warm and dry. No rash noted. She is not diaphoretic. No erythema.  Psychiatric: She has a normal mood and affect. Her behavior is normal. Thought  content normal.    ED Course  Procedures (including critical care time)  Results for orders placed during the hospital encounter of 01/11/13  URINALYSIS, ROUTINE W REFLEX MICROSCOPIC      Result Value Range   Color, Urine YELLOW  YELLOW   APPearance CLOUDY (*) CLEAR   Specific Gravity, Urine 1.027  1.005 - 1.030   pH 5.5  5.0 - 8.0   Glucose, UA NEGATIVE  NEGATIVE mg/dL   Hgb urine dipstick NEGATIVE  NEGATIVE   Bilirubin Urine NEGATIVE  NEGATIVE   Ketones, ur NEGATIVE  NEGATIVE mg/dL   Protein, ur NEGATIVE  NEGATIVE mg/dL   Urobilinogen, UA 0.2  0.0 - 1.0 mg/dL   Nitrite NEGATIVE  NEGATIVE   Leukocytes, UA NEGATIVE  NEGATIVE  PREGNANCY, URINE      Result Value Range   Preg Test, Ur NEGATIVE  NEGATIVE   Dg Chest 2 View  01/11/2013   CLINICAL DATA:  Back pain.  EXAM: CHEST  2 VIEW  COMPARISON:  Chest x-ray 01/23/2012.  FINDINGS: Lung volumes are normal. No consolidative airspace disease. No pleural effusions. No pneumothorax. No pulmonary nodule or mass noted. Pulmonary vasculature and the cardiomediastinal silhouette are within normal limits.  IMPRESSION: 1.  No radiographic evidence of acute cardiopulmonary disease.   Electronically Signed   By: Trudie Reed M.D.   On: 01/11/2013 09:31   Labs Review Labs Reviewed  URINALYSIS, ROUTINE W REFLEX MICROSCOPIC - Abnormal; Notable for the following:     APPearance CLOUDY (*)    All other components within normal limits  PREGNANCY, URINE   Imaging Review Dg Chest 2 View  01/11/2013   CLINICAL DATA:  Back pain.  EXAM: CHEST  2 VIEW  COMPARISON:  Chest x-ray 01/23/2012.  FINDINGS: Lung volumes are normal. No consolidative airspace disease. No pleural effusions. No pneumothorax. No pulmonary nodule or mass noted. Pulmonary vasculature and the cardiomediastinal silhouette are within normal limits.  IMPRESSION: 1.  No radiographic evidence of acute cardiopulmonary disease.   Electronically Signed   By: Trudie Reed M.D.   On: 01/11/2013 09:31    EKG Interpretation   None       MDM   1. Muscle strain   2. Back pain     Medications  ketorolac (TORADOL) injection 60 mg (60 mg Intramuscular Given 01/11/13 0946)   Filed Vitals:   01/11/13 0800 01/11/13 0830 01/11/13 1022 01/11/13 1030  BP: 130/61 138/82 123/68 118/63  Pulse: 91 90  80  Temp:      TempSrc:      Resp: 16 25 11 19   SpO2: 98% 99% 98% 98%    Patient presenting to emergency department with right-sided back pain has been ongoing for the past couple weeks, worsening over the past couple of days. Patient reports that as a sharp pain that worsens with motion upon palpation. Patient reports she's been experiencing increased pain with deep inhalation and coughing. Patient reports she's been coughing for the past 2 weeks, productive cough. Alert and oriented. GCS 15. Heart rate and rhythm normal. Lungs clear to auscultation to upper and lower lobes bilaterally. Negative pain upon palpation to the chest wall. Pulses palpable and strong, radial and DP 2+ bilaterally. Discomfort upon palpation to the right lumbosacral spine, paravertebral region. Pain with rotation of the torso, described as a stretching sensation. Obese. Full range of motion to upper lower tremors bilaterally without difficulty noted. Negative neurological deficits identified. Patient neurovascularly intact. Urine  negative findings of hemoglobin or  oxalate crystals-negative signs of infection. Chest x-ray unremarkable, negative acute cardiopulmonary disease identified. Doubt nephrolithiasis. Doubt pyelonephritis. Doubt cauda equina. Doubt epidural abscess. Suspicion to be muscular discomfort secondary to strain from coughing. Patient stable, afebrile while in ED setting. Blood pressure controlled. Pulse ox adequate on room air. Discharge patient with muscle relaxers and anti-inflammatories. Referred patient to primary care provider. Discussed with patient to rest, heat and massage. Discussed with patient to closely monitor symptoms and if symptoms are to worsen or change report back to emergency department gastric return structures given. Discussed with patient to closely monitor symptoms and if symptoms are to worsen or change to report back to the ED - strict return instructions given.  Patient agreed to plan of care, understood, all questions answered.    Raymon Mutton, PA-C 01/12/13 1858

## 2013-01-11 NOTE — ED Notes (Signed)
Patient transported to X-ray 

## 2013-01-11 NOTE — ED Notes (Signed)
Pt states she has been having cough x2 weeks. States at first her right back would hurt when she coughed but now it hurts when she takes breaths in. Pt denies injury. Reports she took some medicine for cough and states it has gotten better but the pain in her right side has gotten worse. Pt denies being on blood thinners or h/o blood clot.

## 2013-01-12 NOTE — ED Provider Notes (Signed)
Medical screening examination/treatment/procedure(s) were performed by non-physician practitioner and as supervising physician I was immediately available for consultation/collaboration.  EKG Interpretation   None         Rolland Porter, MD 01/12/13 2248

## 2013-01-13 ENCOUNTER — Ambulatory Visit (INDEPENDENT_AMBULATORY_CARE_PROVIDER_SITE_OTHER): Payer: No Typology Code available for payment source | Admitting: Internal Medicine

## 2013-01-13 VITALS — BP 148/88 | HR 91 | Temp 98.2°F | Ht 60.0 in | Wt 219.4 lb

## 2013-01-13 DIAGNOSIS — J069 Acute upper respiratory infection, unspecified: Secondary | ICD-10-CM

## 2013-01-13 DIAGNOSIS — J209 Acute bronchitis, unspecified: Secondary | ICD-10-CM

## 2013-01-13 DIAGNOSIS — E1139 Type 2 diabetes mellitus with other diabetic ophthalmic complication: Secondary | ICD-10-CM

## 2013-01-13 HISTORY — DX: Acute upper respiratory infection, unspecified: J06.9

## 2013-01-13 MED ORDER — SITAGLIPTIN PHOSPHATE 100 MG PO TABS: 100.0000 mg | ORAL_TABLET | Freq: Every day | ORAL | Status: DC

## 2013-01-13 MED ORDER — ALBUTEROL SULFATE HFA 108 (90 BASE) MCG/ACT IN AERS: 2.0000 | INHALATION_SPRAY | Freq: Four times a day (QID) | RESPIRATORY_TRACT | Status: DC | PRN

## 2013-01-13 MED ORDER — AZITHROMYCIN 250 MG PO TABS: ORAL_TABLET | ORAL | Status: AC

## 2013-01-13 MED ORDER — BENZONATATE 100 MG PO CAPS: 200.0000 mg | ORAL_CAPSULE | Freq: Three times a day (TID) | ORAL | Status: AC | PRN

## 2013-01-13 NOTE — Patient Instructions (Signed)
Take the Azithromycin, 2 pills today, and then 1 pill for the next 4 days for a total of 5 days. Take the Tessalon Perels 1 tablet 3 times a day as needed for your cough. Use the albuterol inhaler 2 puffs every 6 hours as needed for your cough.   Azithromycin tablets What is this medicine? AZITHROMYCIN (az ith roe MYE sin) is a macrolide antibiotic. It is used to treat or prevent certain kinds of bacterial infections. It will not work for colds, flu, or other viral infections. This medicine may be used for other purposes; ask your health care provider or pharmacist if you have questions. COMMON BRAND NAME(S): Zithromax Tri-Pak, Zithromax Z-Pak, Zithromax What should I tell my health care provider before I take this medicine? They need to know if you have any of these conditions: -kidney disease -liver disease -irregular heartbeat or heart disease -an unusual or allergic reaction to azithromycin, erythromycin, other macrolide antibiotics, foods, dyes, or preservatives -pregnant or trying to get pregnant -breast-feeding How should I use this medicine? Take this medicine by mouth with a full glass of water. Follow the directions on the prescription label. The tablets can be taken with food or on an empty stomach. If the medicine upsets your stomach, take it with food. Take your medicine at regular intervals. Do not take your medicine more often than directed. Take all of your medicine as directed even if you think your are better. Do not skip doses or stop your medicine early. Talk to your pediatrician regarding the use of this medicine in children. Special care may be needed. Overdosage: If you think you have taken too much of this medicine contact a poison control center or emergency room at once. NOTE: This medicine is only for you. Do not share this medicine with others. What if I miss a dose? If you miss a dose, take it as soon as you can. If it is almost time for your next dose, take only that  dose. Do not take double or extra doses. What may interact with this medicine? Do not take this medicine with any of the following medications: -lincomycin This medicine may also interact with the following medications: -amiodarone -antacids -cyclosporine -digoxin -magnesium -nelfinavir -phenytoin -warfarin This list may not describe all possible interactions. Give your health care provider a list of all the medicines, herbs, non-prescription drugs, or dietary supplements you use. Also tell them if you smoke, drink alcohol, or use illegal drugs. Some items may interact with your medicine. What should I watch for while using this medicine? Tell your doctor or health care professional if your symptoms do not improve. Do not treat diarrhea with over the counter products. Contact your doctor if you have diarrhea that lasts more than 2 days or if it is severe and watery. This medicine can make you more sensitive to the sun. Keep out of the sun. If you cannot avoid being in the sun, wear protective clothing and use sunscreen. Do not use sun lamps or tanning beds/booths. What side effects may I notice from receiving this medicine? Side effects that you should report to your doctor or health care professional as soon as possible: -allergic reactions like skin rash, itching or hives, swelling of the face, lips, or tongue -confusion, nightmares or hallucinations -dark urine -difficulty breathing -hearing loss -irregular heartbeat or chest pain -pain or difficulty passing urine -redness, blistering, peeling or loosening of the skin, including inside the mouth -white patches or sores in the mouth -yellowing  of the eyes or skin Side effects that usually do not require medical attention (report to your doctor or health care professional if they continue or are bothersome): -diarrhea -dizziness, drowsiness -headache -stomach upset or vomiting -tooth discoloration -vaginal irritation This list  may not describe all possible side effects. Call your doctor for medical advice about side effects. You may report side effects to FDA at 1-800-FDA-1088. Where should I keep my medicine? Keep out of the reach of children. Store at room temperature between 15 and 30 degrees C (59 and 86 degrees F). Throw away any unused medicine after the expiration date. NOTE: This sheet is a summary. It may not cover all possible information. If you have questions about this medicine, talk to your doctor, pharmacist, or health care provider.  2014, [employer]. (2012-07-02 15:42:07)

## 2013-01-13 NOTE — Progress Notes (Signed)
Patient ID: Sandra [name], female   DOB: [birth year], 47 y.o.   MRN: [id number]  Subjective:   Patient ID: Sandra [name] female   DOB: [birth year] 47 y.o.   MRN: [id number]  HPI: Sandra [name] is a 47 y.o. F w/ LBBB, HTN, migraines, asthma, DM 2 with retinopathy, obesity and HLD presents for an acute visit for a cough.  She was seen in the ED on 12/21 c/o back pain associated with a productive cough and nasal congestion x2 weeks. A CXR was done at that time and was without acute process.  She states that she was given muscle relaxant for her back pain. She states that this has not helped her pain. She states that her cough is worse at night and the mucus she coughs up has a bad taste but she has not seen it. She states that the mucus from her nose is green. She has tried Coricidin for her cough which has helped somewhat during the day but not at night.   She denies fevers, nausea, vomiting, abdominal pain, hematuria, dysuria, melena, hematochezia, changes appetite, fall, injuries, urinary and bowel incontinence.    Past Medical History  Diagnosis Date  . Diabetes mellitus type 2, uncontrolled DX: 1999    Started as gestational diabetes  . HSV (herpes simplex virus) anogenital infection   . Migraine     Normal CT head (08/06/2006)  . ESSENTIAL HYPERTENSION 07/08/2006  . HYPERLIPIDEMIA 08/20/2007  . Menorrhagia   . Asthma     as a child  . Carpal tunnel syndrome    Current Outpatient Prescriptions  Medication Sig Dispense Refill  . atorvastatin (LIPITOR) 10 MG tablet Take 5 mg by mouth every evening.      . Chlorphen-PE-Acetaminophen (CORICIDIN D COLD/FLU/SINUS PO) Take 2 tablets by mouth every 6 (six) hours as needed (for cough).      . eletriptan (RELPAX) 20 MG tablet Take 1 tablet (20 mg total) by mouth as needed for migraine (may repeat in 2 hours if necessary). Repeat only once if needed.  18 tablet  2  . glipiZIDE (GLUCOTROL) 10 MG tablet Take 1 tablet (10 mg total) by  mouth 2 (two) times daily before a meal.  180 tablet  1  . ibuprofen (ADVIL,MOTRIN) 800 MG tablet Take 1 tablet (800 mg total) by mouth every 8 (eight) hours as needed for pain.  30 tablet  1  . ibuprofen (ADVIL,MOTRIN) 800 MG tablet Take 1 tablet (800 mg total) by mouth 3 (three) times daily.  21 tablet  0  . medroxyPROGESTERone (PROVERA) 10 MG tablet Take 2 tablets (20 mg total) by mouth daily. Can increase to two tablets twice a day for heavy bleeding  30 tablet  2  . metFORMIN (GLUCOPHAGE) 1000 MG tablet Take 1 tablet (1,000 mg total) by mouth 2 (two) times daily with a meal.  60 tablet  11  . methocarbamol (ROBAXIN) 500 MG tablet Take 1 tablet (500 mg total) by mouth 2 (two) times daily.  20 tablet  0  . propranolol (INDERAL) 20 MG tablet Take 1 tablet (20 mg total) by mouth 2 (two) times daily.  60 tablet  6  . quinapril-hydrochlorothiazide (ACCURETIC) 20-12.5 MG per tablet Take 1 tablet by mouth daily.  30 tablet  2  . sitaGLIPtin (JANUVIA) 100 MG tablet Take 1 tablet (100 mg total) by mouth daily.  90 tablet  0   No current facility-administered medications for this visit.   Family History  Problem  Relation Age of Onset  . Hypertension Father   . Diabetes Father    History   Social History  . Marital Status: Married    Spouse Name: N/A    Number of Children: N/A  . Years of Education: N/A   Occupational History  . Daycare teacher    Social History Main Topics  . Smoking status: Never Smoker   . Smokeless tobacco: Never Used  . Alcohol Use: No  . Drug Use: No  . Sexual Activity: Yes    Birth Control/ Protection: Surgical   Other Topics Concern  . Not on file   Social History Narrative   Studying at [employer] in early childhood education.   Married.   Review of Systems: A 12 point ROS was performed; pertinent positives and negatives were noted in the HPI   Objective:  Physical Exam: Filed Vitals:   01/13/13 0825  BP: 148/88  Pulse: 91  Temp: 98.2 F (36.8 C)   TempSrc: Oral  Height: 5' (1.524 m)  Weight: 219 lb 6.4 oz (99.519 kg)  SpO2: 100%   Constitutional: Vital signs reviewed.  Patient is a well-developed and well-nourished female in no acute distress and cooperative with exam.  Head: Normocephalic and atraumatic Ear: TM normal bilaterally Nose: +erythema and purulent drainage noted.  Turbinates edematous. Mouth: No erythema or exudates, MMM Eyes: PERRL, EOMI, conjunctivae normal, No scleral icterus.  Neck: Supple, Trachea midline, normal ROM Cardiovascular: RRR, no MRG, pulses symmetric and intact bilaterally Pulmonary/Chest: Normal respiratory effort, CTAB, no wheezes, rales, or rhonchi. +productive cough. Abdominal: Soft. Non-tender, non-distended, bowel sounds are normal, Musculoskeletal: Moves all 4 extremities. ROM full and no nontender Hematology: No cervical adenopathy.  Neurological: A&O x3, no focal neurological deficits Skin: Warm, dry and intact.   Psychiatric: Normal mood and affect. speech and behavior is normal.   Assessment & Plan:   Please refer to Problem List based Assessment and Plan

## 2013-01-13 NOTE — Assessment & Plan Note (Addendum)
Pt with symptoms of bronchitis for over 2 weeks. She was seen in the ED on 12/21 with similar symptoms. CXR was normal, and she was treated supportively without relief of her symptoms, especially her cough. Given the duration of her sympotms for greater than 2 weeks, will treat for bronchitis and provide her with and antibiotic, albuterol inhaler, and cough suppressant. She had similar presentation a year ago and required antibiotics. - Azithromycin 500mg  x1 day, then 250mg  days 2-5 - Tessalon Perles 100mg  TID PRN cough - Albuterol inhaler 2p q6h PRN cough/SOB.  - Return to the clinic in 1 month for f/u of chronic medical issues

## 2013-01-13 NOTE — Progress Notes (Signed)
Case discussed with Dr. Glenn at time of visit.  We reviewed the resident's history and exam and pertinent patient test results.  I agree with the assessment, diagnosis, and plan of care documented in the resident's note. 

## 2013-01-29 ENCOUNTER — Encounter: Payer: Self-pay | Admitting: Internal Medicine

## 2013-01-29 ENCOUNTER — Ambulatory Visit (INDEPENDENT_AMBULATORY_CARE_PROVIDER_SITE_OTHER): Payer: No Typology Code available for payment source | Admitting: Internal Medicine

## 2013-01-29 ENCOUNTER — Telehealth: Payer: Self-pay | Admitting: Internal Medicine

## 2013-01-29 VITALS — BP 122/73 | HR 100 | Temp 97.7°F | Ht 60.0 in | Wt 219.4 lb

## 2013-01-29 DIAGNOSIS — S29011A Strain of muscle and tendon of front wall of thorax, initial encounter: Secondary | ICD-10-CM

## 2013-01-29 DIAGNOSIS — R059 Cough, unspecified: Secondary | ICD-10-CM

## 2013-01-29 DIAGNOSIS — R05 Cough: Secondary | ICD-10-CM | POA: Insufficient documentation

## 2013-01-29 DIAGNOSIS — IMO0002 Reserved for concepts with insufficient information to code with codable children: Secondary | ICD-10-CM

## 2013-01-29 MED ORDER — DEXTROMETHORPHAN-GUAIFENESIN 10-100 MG/5ML PO LIQD: 5.0000 mL | ORAL | Status: DC | PRN

## 2013-01-29 MED ORDER — SALINE SPRAY 0.65 % NA SOLN: 1.0000 | NASAL | Status: DC | PRN

## 2013-01-29 NOTE — Assessment & Plan Note (Signed)
Patient most likely suffering from postinfectious cough given her recent completion of antibiotics for acute bronchitis. It is nonproductive in nature which is reassuring and patient has no other localizing physical exam findings to suggest acute asthma exacerbation or subsequent superinfection such as pneumonia. Patient doesn't describe any other GI symptoms that might indicate GERD. -Tussin DM cough syrup -nasal spray to limit PND

## 2013-01-29 NOTE — Progress Notes (Signed)
   Subjective:    Patient ID: Sandra [name], female    DOB: 08-19-1965, 48 y.o.   MRN: [id number]  HPI Sandra [name] is a 48 yo woman pmh as listed below presenting with right sided pain.   Pt was recently seen and treated for acute bronchitis on 01/13/13. She completed her course of antibiotics and has significantly improved she no longer has any fevers chills sinus pressure ear fullness. She has good appetite. 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 The only lasting symptoms seems to be a cough that was very severe in nature but has diminished some but is still continual she continues to take some Tessalon Perles intermittently but also has stopped given that is nonproductive in nature. She has been told and has had a light home on her right back side for several years and is more concerned that there is a pathological process going on given that she has right-sided rib pain. She was evaluated in urgent care for this rib pain and was diagnosed with muscle strain and given Flexeril that has provided some relief for her. She also states that hot compresses and warm baths or showers also provide some relief she's tried over-the-counter ibuprofen also with minimal relief. She denied any trauma to the area recently.   Review of Systems  Constitutional: Negative for fever, chills, activity change and fatigue.  HENT: Positive for congestion and postnasal drip. Negative for rhinorrhea, sinus pressure and sneezing.   Respiratory: Positive for cough. Negative for choking, shortness of breath and wheezing.   Cardiovascular: Negative for chest pain, palpitations and leg swelling.  Gastrointestinal: Negative for nausea, vomiting, abdominal pain, diarrhea and constipation.  Musculoskeletal: Positive for myalgias (rib pain that is worse with cough and laying on the right side). Negative for arthralgias.       Objective:   Physical  Exam Filed Vitals:   01/29/13 1539  BP: 122/73  Pulse: 100  Temp: 97.7 F (36.5 C)   General: sitting in chair, NAD HEENT: PERRL, EOMI, no scleral icterus Cardiac: RRR, no rubs, murmurs or gallops Pulm: clear to auscultation bilaterally, moving normal volumes of air, TTP on right intercostals, Abd: soft, nontender, nondistended, BS present Ext: warm and well perfused, no pedal edema, significant mobile circular mass on right paraspinal region Neuro: alert and oriented X3, cranial nerves II-XII grossly intact     Assessment & Plan:  Please see problem oriented charting.  Patient discussed with Dr. Stann Mainland

## 2013-01-29 NOTE — Assessment & Plan Note (Signed)
Appears the patient has had significant coughing which may have added muscle strain to her intercostals. Does not have any limited range of motion or trauma to the area. -Supportive management with NSAIDs, Tylenol, rest, heat/cold compress

## 2013-01-29 NOTE — Patient Instructions (Signed)
In terms of your muscle pain:   -try and lower and control your cough to help stop injuring your rib muscles -continue doing hot/cold compress on that side  -continue taking hot shower/bath -use nasal spray as many times a day as you need to prevent post nasal drip to help control your cough -continue ibuprofen or tylenol for the muscle pain  Have happy new year !

## 2013-01-29 NOTE — Telephone Encounter (Signed)
   Reason for call:   I received a call from [name] at 17:01 PM indicating that she just left [facility] and was seen by a female doctor and was told her cough medicine and nasal spray would be called into her pharmacy. She says she went to the pharmacy and they said they had no medications for her at this time.    Pertinent Data:   Seen in [facility] today by Dr. Algis Liming for post-infectious cough and prescribed Tussin DM and nasal spray  I checked epic, and both medications were ordered today ~4pm, which might explain why when she went to the pharmacy they were not there. She informed me she is still there and will double check again.    Assessment / Plan / Recommendations:   [name] is a 48 year old female with PMH HTN, DM2, HSV, obesity and asthma seen in [facility] today for cough.   I informed her the medications were sent to the pharmacy per [facility] and if there are any concerns, to call us back or have the pharmacy call us.   Will forward to Dr. Algis Liming as well.    Jerene Pitch, MD   01/29/2013, 6:06 PM

## 2013-01-30 LAB — GLUCOSE, CAPILLARY: Glucose-Capillary: 217 mg/dL — ABNORMAL HIGH (ref 70–99)

## 2013-02-03 NOTE — Progress Notes (Signed)
Case discussed with Dr. Sadek soon after the resident saw the patient.  We reviewed the resident's history and exam and pertinent patient test results.  I agree with the assessment, diagnosis, and plan of care documented in the resident's note. 

## 2013-04-07 ENCOUNTER — Other Ambulatory Visit: Payer: Self-pay | Admitting: Internal Medicine

## 2013-04-07 DIAGNOSIS — E11319 Type 2 diabetes mellitus with unspecified diabetic retinopathy without macular edema: Secondary | ICD-10-CM

## 2013-04-07 NOTE — Progress Notes (Signed)
Pt seen at [facility] on 02/16/2013.//kg

## 2013-04-07 NOTE — Addendum Note (Signed)
Addended by: Silverio Decamp C on: 04/07/2013 04:21 PM   Modules accepted: Orders

## 2013-04-13 ENCOUNTER — Other Ambulatory Visit: Payer: Self-pay | Admitting: *Deleted

## 2013-04-13 DIAGNOSIS — I10 Essential (primary) hypertension: Secondary | ICD-10-CM

## 2013-04-14 ENCOUNTER — Other Ambulatory Visit: Payer: Self-pay | Admitting: *Deleted

## 2013-04-14 DIAGNOSIS — I10 Essential (primary) hypertension: Secondary | ICD-10-CM

## 2013-04-14 MED ORDER — PROPRANOLOL HCL 20 MG PO TABS: 20.0000 mg | ORAL_TABLET | Freq: Two times a day (BID) | ORAL | Status: DC

## 2013-04-14 NOTE — Telephone Encounter (Signed)
review 

## 2013-04-14 NOTE — Telephone Encounter (Signed)
Message sent to front office to schedule pt an appt. 

## 2013-04-29 ENCOUNTER — Encounter: Payer: Self-pay | Admitting: Internal Medicine

## 2013-04-29 ENCOUNTER — Telehealth: Payer: Self-pay | Admitting: *Deleted

## 2013-04-29 ENCOUNTER — Ambulatory Visit (INDEPENDENT_AMBULATORY_CARE_PROVIDER_SITE_OTHER): Payer: No Typology Code available for payment source | Admitting: Internal Medicine

## 2013-04-29 VITALS — BP 138/75 | HR 100 | Temp 97.9°F | Ht 60.0 in | Wt 220.0 lb

## 2013-04-29 DIAGNOSIS — W57XXXA Bitten or stung by nonvenomous insect and other nonvenomous arthropods, initial encounter: Secondary | ICD-10-CM

## 2013-04-29 DIAGNOSIS — L409 Psoriasis, unspecified: Secondary | ICD-10-CM

## 2013-04-29 DIAGNOSIS — T148 Other injury of unspecified body region: Secondary | ICD-10-CM

## 2013-04-29 DIAGNOSIS — I10 Essential (primary) hypertension: Secondary | ICD-10-CM

## 2013-04-29 DIAGNOSIS — E1139 Type 2 diabetes mellitus with other diabetic ophthalmic complication: Secondary | ICD-10-CM

## 2013-04-29 HISTORY — DX: Psoriasis, unspecified: L40.9

## 2013-04-29 LAB — GLUCOSE, CAPILLARY: Glucose-Capillary: 341 mg/dL — ABNORMAL HIGH (ref 70–99)

## 2013-04-29 LAB — POCT GLYCOSYLATED HEMOGLOBIN (HGB A1C): Hemoglobin A1C: 8.5

## 2013-04-29 MED ORDER — HYDROCORTISONE 2.5 % EX LOTN: TOPICAL_LOTION | CUTANEOUS | Status: DC

## 2013-04-29 NOTE — Progress Notes (Signed)
   Subjective:    Patient ID: Sandra [name], female    DOB: 06/22/1965, 48 y.o.   MRN: [id number]  HPI Comments: 48 y.o Past Medical History iabetes mellitus 2 (HA1C 8.5, cbg 341), HSV,  Migraine, HTN,  HYPERLIPIDEMIA,  Menorrhagia, Asthma, Carpal tunnel syndrome, acute bronchitis          She presents for: 1)  Itchy bumps on both arms, chest and back ? Bites from bugs.  She states Saturday she was bitten by a black bug ? Location on her both.  Saturday and Sunday she started itching and Monday she developed bumps on her arms, chest, back.  She does not have pets but recently had death in the family and was at an aunt's house who has a dog which was not present.  She has not changed soaps, detergents (uses Sun detergent), lotions or perfumes.  She is using OTC HC cream 1% and alcohol to the bites with some relief with HC cream.  She lives with her husband who is not itching at home.  She denies travel.  She states sweating makes the itching worse.  2) DM-HA1C 8.5 today.  Pt states she has not been exercising as much but sometimes misses her Glucotrol 10 mg bid, Januvia 100, and Metformin 1000 mg bid.   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  64 y.o son.                       Review of Systems  Constitutional: Negative for fever and chills.  Skin: Positive for rash.       Objective:   Physical Exam  Nursing note and vitals reviewed. Constitutional: She is oriented to person, place, and time. She appears well-developed and well-nourished. No distress.  HENT:  Head: Normocephalic and atraumatic.  Eyes: Conjunctivae are normal.  Cardiovascular: Normal rate, regular rhythm and normal heart sounds.   Pulmonary/Chest: Effort normal and breath sounds normal.  Neurological: She is alert and oriented to person, place, and time.  Skin:  Multiple Erythematous papules to arms b/l, mid chest and lower back.  Some papules excoriated   Psychiatric: She has a normal mood and affect. Her speech is normal and behavior is normal. Judgment and thought content normal. Cognition and memory are normal.          Assessment & Plan:  F/u in 1 week rash  F/u 1-3 months HTN, DM

## 2013-04-29 NOTE — Telephone Encounter (Signed)
Agree, thanks

## 2013-04-29 NOTE — Telephone Encounter (Signed)
Pt states a couple of days ago she was bitten by a bug and now she has whelps over her body, making her uncomfortable, desires to be seen, appt 0930 dr Aundra Dubin

## 2013-04-29 NOTE — Assessment & Plan Note (Signed)
Lab Results  Component Value Date   HGBA1C 8.5 04/29/2013   HGBA1C 7.8 12/03/2012   HGBA1C 7.3 05/29/2012     Assessment: Diabetes control: fair control Progress toward A1C goal:  deteriorated Comments: none  Plan: Medications:  continue current medications; encouraged compliance  Home glucose monitoring: Frequency: once a day at least  Timing: N/A Instruction/counseling given: reminded to bring blood glucose meter & log to each visit, reminded to bring medications to each visit, discussed diet and provided printed educational material Educational resources provided: brochure;other (see comments) Self management tools provided: other (see comments) Other plans: f/u 06/2013

## 2013-04-29 NOTE — Assessment & Plan Note (Addendum)
Papular, pruritic, erythematous bites to areas (ddx fleas, bug bite reaction, bed bugs) Will try Sarna Lotion, Benadyrl or Zyrtec prn Will try HC 2.5 % lotion to bites bid prn  F/u in 1 week if not improved  Patient seen with Dr. Murlean Caller today

## 2013-04-29 NOTE — Assessment & Plan Note (Signed)
BP Readings from Last 3 Encounters:  04/29/13 138/75  01/29/13 122/73  01/13/13 148/88    Lab Results  Component Value Date   NA 136 12/22/2012   K 4.4 12/22/2012   CREATININE 0.71 12/22/2012    Assessment: Blood pressure control: controlled Progress toward BP goal:  at goal Comments: repeat controlled   Plan: Medications:  continue current medications Educational resources provided: brochure;other (see comments) Self management tools provided: other (see comments) Other plans: f/u 06/2013

## 2013-04-29 NOTE — Patient Instructions (Addendum)
General Instructions: You can try Sarna-an over the counter lotion for itching Zyrtec 10 mg daily-antihistamine that may make you sleepy or Benadryl as needed for itching  Use Hydrocortisone to itching bumps to skin Return in 1 week if not better.   Take your diabetes medications and f/u in 1-3 months with your regular doctor for diabetes and hypertension  Take care.    Treatment Goals:  Goals (1 Years of Data) as of 04/29/13         As of Today As of Today 01/29/13 01/13/13 01/11/13     Blood Pressure    . Blood Pressure < 140/80  138/75 152/80 122/73 148/88 113/40     Result Component    . HEMOGLOBIN A1C < 7.0  8.5        . LDL CALC < 100            Progress Toward Treatment Goals:  Treatment Goal 04/29/2013  Hemoglobin A1C deteriorated  Blood pressure at goal    Self Care Goals & Plans:  Self Care Goal 04/29/2013  Manage my medications take my medicines as prescribed; bring my medications to every visit; refill my medications on time  Monitor my health keep track of my blood glucose; bring my glucose meter and log to each visit; keep track of my blood pressure; bring my blood pressure log to each visit; keep track of my weight; check my feet daily  Eat healthy foods drink diet soda or water instead of juice or soda; eat more vegetables; eat foods that are low in salt; eat baked foods instead of fried foods; eat smaller portions; eat fruit for snacks and desserts  Be physically active find an activity I enjoy  Meeting treatment goals maintain the current self-care plan    Home Blood Glucose Monitoring 04/29/2013  Check my blood sugar once a day  When to check my blood sugar N/A     Care Management & Community Referrals:  Referral 04/29/2013  Referrals made for care management support none needed  Referrals made to community resources none        Insect Bite Mosquitoes, flies, fleas, bedbugs, and many other insects can bite. Insect bites are different from insect stings.  A sting is when venom is injected into the skin. Some insect bites can transmit infectious diseases. SYMPTOMS  Insect bites usually turn red, swell, and itch for 2 to 4 days. They often go away on their own. TREATMENT  Your caregiver may prescribe antibiotic medicines if a bacterial infection develops in the bite. HOME CARE INSTRUCTIONS  Do not scratch the bite area.  Keep the bite area clean and dry. Wash the bite area thoroughly with soap and water.  Put ice or cool compresses on the bite area.  Put ice in a plastic bag.  Place a towel between your skin and the bag.  Leave the ice on for 20 minutes, 4 times a day for the first 2 to 3 days, or as directed.  You may apply a baking soda paste, cortisone cream, or calamine lotion to the bite area as directed by your caregiver. This can help reduce itching and swelling.  Only take over-the-counter or prescription medicines as directed by your caregiver.  If you are given antibiotics, take them as directed. Finish them even if you start to feel better. You may need a tetanus shot if:  You cannot remember when you had your last tetanus shot.  You have never had a tetanus shot.  The injury broke your skin. If you get a tetanus shot, your arm may swell, get red, and feel warm to the touch. This is common and not a problem. If you need a tetanus shot and you choose not to have one, there is a rare chance of getting tetanus. Sickness from tetanus can be serious. SEEK IMMEDIATE MEDICAL CARE IF:   You have increased pain, redness, or swelling in the bite area.  You see a red line on the skin coming from the bite.  You have a fever.  You have joint pain.  You have a headache or neck pain.  You have unusual weakness.  You have a rash.  You have chest pain or shortness of breath.  You have abdominal pain, nausea, or vomiting.  You feel unusually tired or sleepy. MAKE SURE YOU:   Understand these instructions.  Will watch  your condition.  Will get help right away if you are not doing well or get worse. Document Released: 02/16/2004 Document Revised: 04/02/2011 Document Reviewed: 08/09/2010 [facility] Patient Information 2014 [place], [place].  Diphenhydramine capsules or tablets What is this medicine? DIPHENHYDRAMINE (dye fen HYE dra meen) is an antihistamine. It is used to treat the symptoms of an allergic reaction. It is also used to treat Parkinson's disease. This medicine is also used to prevent and to treat motion sickness and as a nighttime sleep aid. This medicine may be used for other purposes; ask your health care provider or pharmacist if you have questions. COMMON BRAND NAME(S): Alka-Seltzer Plus Allergy, Banophen , Benadryl Allergy Dye Free, Benadryl Allergy Kapgel, Benadryl Allergy Ultratab, Benadryl Allergy, Diphedryl , Diphenhist, Genahist , Q-Dryl, Veto Kemps, Valu-Dryl , Vicks ZzzQuil Nightime Sleep-Aid What should I tell my health care provider before I take this medicine? They need to know if you have any of these conditions: -asthma or lung disease -glaucoma -high blood pressure or heart disease -liver disease -pain or difficulty passing urine -prostate trouble -ulcers or other stomach problems -an unusual or allergic reaction to diphenhydramine, other medicines foods, dyes, or preservatives such as sulfites -pregnant or trying to get pregnant -breast-feeding How should I use this medicine? Take this medicine by mouth with a full glass of water. Follow the directions on the prescription label. Take your doses at regular intervals. Do not take your medicine more often than directed. To prevent motion sickness start taking this medicine 30 to 60 minutes before you leave. Talk to your pediatrician regarding the use of this medicine in children. Special care may be needed. Patients over 64 years old may have a stronger reaction and need a smaller dose. Overdosage: If you think you have taken too  much of this medicine contact a poison control center or emergency room at once. NOTE: This medicine is only for you. Do not share this medicine with others. What if I miss a dose? If you miss a dose, take it as soon as you can. If it is almost time for your next dose, take only that dose. Do not take double or extra doses. What may interact with this medicine? Do not take this medicine with any of the following medications: -MAOIs like Carbex, Eldepryl, Marplan, Nardil, and Parnate This medicine may also interact with the following medications: -alcohol -barbiturates, like phenobarbital -medicines for bladder spasm like oxybutynin, tolterodine -medicines for blood pressure -medicines for depression, anxiety, or psychotic disturbances -medicines for movement abnormalities or Parkinson's disease -medicines for sleep -other medicines for cold, cough or allergy -some medicines for  the stomach like chlordiazepoxide, dicyclomine This list may not describe all possible interactions. Give your health care provider a list of all the medicines, herbs, non-prescription drugs, or dietary supplements you use. Also tell them if you smoke, drink alcohol, or use illegal drugs. Some items may interact with your medicine. What should I watch for while using this medicine? Visit your doctor or health care professional for regular check ups. Tell your doctor if your symptoms do not improve or if they get worse. Your mouth may get dry. Chewing sugarless gum or sucking hard candy, and drinking plenty of water may help. Contact your doctor if the problem does not go away or is severe. This medicine may cause dry eyes and blurred vision. If you wear contact lenses you may feel some discomfort. Lubricating drops may help. See your eye doctor if the problem does not go away or is severe. You may get drowsy or dizzy. Do not drive, use machinery, or do anything that needs mental alertness until you know how this medicine  affects you. Do not stand or sit up quickly, especially if you are an older patient. This reduces the risk of dizzy or fainting spells. Alcohol may interfere with the effect of this medicine. Avoid alcoholic drinks. What side effects may I notice from receiving this medicine? Side effects that you should report to your doctor or health care professional as soon as possible: -allergic reactions like skin rash, itching or hives, swelling of the face, lips, or tongue -changes in vision -confused, agitated, nervous -irregular or fast heartbeat -tremor -trouble passing urine -unusual bleeding or bruising -unusually weak or tired Side effects that usually do not require medical attention (report to your doctor or health care professional if they continue or are bothersome): -constipation, diarrhea -drowsy -headache -loss of appetite -stomach upset, vomiting -thick mucous This list may not describe all possible side effects. Call your doctor for medical advice about side effects. You may report side effects to FDA at 1-800-FDA-1088. Where should I keep my medicine? Keep out of the reach of children. Store at room temperature between 15 and 30 degrees C (59 and 86 degrees F). Keep container closed tightly. Throw away any unused medicine after the expiration date. NOTE: This sheet is a summary. It may not cover all possible information. If you have questions about this medicine, talk to your doctor, pharmacist, or health care provider.  2014, [employer]vier/[employer] Standard. (2007-04-28 17:06:22)   Exercise to Lose Weight Exercise and a healthy diet may help you lose weight. Your doctor may suggest specific exercises. EXERCISE IDEAS AND TIPS  Choose low-cost things you enjoy doing, such as walking, bicycling, or exercising to workout videos.  Take stairs instead of the elevator.  Walk during your lunch break.  Park your car further away from work or school.  Go to a gym or an exercise  class.  Start with 5 to 10 minutes of exercise each day. Build up to 30 minutes of exercise 4 to 6 days a week.  Wear shoes with good support and comfortable clothes.  Stretch before and after working out.  Work out until you breathe harder and your heart beats faster.  Drink extra water when you exercise.  Do not do so much that you hurt yourself, feel dizzy, or get very short of breath. Exercises that burn about 150 calories:  Running 1  miles in 15 minutes.  Playing volleyball for 45 to 60 minutes.  Washing and waxing a car for 45  to 60 minutes.  Playing touch football for 45 minutes.  Walking 1  miles in 35 minutes.  Pushing a stroller 1  miles in 30 minutes.  Playing basketball for 30 minutes.  Raking leaves for 30 minutes.  Bicycling 5 miles in 30 minutes.  Walking 2 miles in 30 minutes.  Dancing for 30 minutes.  Shoveling snow for 15 minutes.  Swimming laps for 20 minutes.  Walking up stairs for 15 minutes.  Bicycling 4 miles in 15 minutes.  Gardening for 30 to 45 minutes.  Jumping rope for 15 minutes.  Washing windows or floors for 45 to 60 minutes. Document Released: 02/10/2010 Document Revised: 04/02/2011 Document Reviewed: 02/10/2010 [facility] Patient Information 2014 [place], [place].  Type 2 Diabetes Mellitus, Adult Type 2 diabetes mellitus is a long-term (chronic) disease. In type 2 diabetes:  The pancreas does not make enough of a hormone called insulin.  The cells in the body do not respond as well to the insulin that is made.  Both of the above can happen. Normally, insulin moves sugars from food into tissue cells. This gives you energy. If you have type 2 diabetes, sugars cannot be moved into tissue cells. This causes high blood sugar (hyperglycemia).  HOME CARE  Have your hemoglobin A1c level checked twice a year. The level shows if your diabetes is under control or out of control.  Perform daily blood sugar testing as told by your  doctor.  Check your ketone levels by testing your pee (urine) when you are sick and as told.  Take your diabetes or insulin medicine as told by your doctor.  Never run out of insulin.  Adjust how much insulin you give yourself based on how many carbs (carbohydrates) you eat. Carbs are in many foods, such as fruits, vegetables, whole grains, and dairy products.  Have a healthy snack between every healthy meal. Have 3 meals and 3 snacks a day.  Lose weight if you are overweight.  Carry a medical alert card or wear your medical alert jewelry.  Carry a 15 gram carb snack with you at all times. Examples include:  Glucose pills, 3 or 4.  Glucose gel, 15 gram tube.  Raisins, 2 tablespoons (24 grams).  Jelly beans, 6.  Animal crackers, 8.  Sugar pop, 4 ounces (120 milliliters).  Gummy treats, 9.  Notice low blood sugar (hypoglycemia) symptoms, such as:  Shaking (tremors).  Decreased ability to think clearly.  Sweating.  Increased heart rate.  Headache.  Dry mouth.  Hunger.  Crabbiness (irritability).  Being worried or tense (anxiety).  Restless sleep.  A change in speech or coordination.  Confusion.  Treat low blood sugar right away. If you are alert and can swallow, follow the 15:15 rule:  Take 15 20 grams of a rapid-acting glucose or carb. This includes glucose gel, glucose pills, or 4 ounces (120 milliliters) of fruit juice, regular pop, or low-fat milk.  Check your blood sugar level after taking the glucose.  Take 15 20 grams of more glucose if the repeat blood sugar level is still 70 mg/dL (milligrams/deciliter) or below.  Eat a meal or snack within 1 hour of the blood sugar levels going back to normal.  Notice early symptoms of high blood sugar, such as:  Being really thirsty or drinking a lot (polydipsia).  Peeing (urinating) a lot (polyuria).  Do at least 150 minutes of physical activity a week or as told.  Split the 150 minutes of activity  up during the week.  Do not do 150 minutes of activity in one day.  Perform exercises, such as weight lifting, at least 2 times a week or as told.  Adjust your insulin or food intake as needed if you start a new exercise or sport.  Follow your sick day plan when you are not able to eat or drink as usual.  Avoid tobacco use.  Women who are not pregnant should drink no more than 1 drink a day. Men should drink no more than 2 drinks a day.  Only drink alcohol with food.  Ask your doctor if alcohol is safe for you.  Tell your doctor if you drink alcohol several times during the week.  See your doctor regularly.  Schedule an eye exam soon after you are diagnosed with diabetes. Schedule exams once every year.  Check your skin and feet every day. Check for cuts, bruises, redness, nail problems, bleeding, blisters, or sores. A doctor should do a foot exam once a year.  Brush your teeth and gums twice a day. Floss once a day. Visit your dentist regularly.  Share your diabetes plan with your workplace or school.  Stay up-to-date with shots that fight against diseases (immunizations).  Learn how to manage stress.  Get diabetes education and support as needed.  Ask your doctor for special help if:  You need help to maintain or improve how you to do things on your own.  You need help to maintain or improve the quality of your life.  You have foot or hand problems.  You have trouble cleaning yourself, dressing, eating, or doing physical activity. GET HELP RIGHT AWAY IF:  You have trouble breathing.  You have moderate to large ketone levels.  You are unable to eat food or drink fluids for more than 6 hours.  You feel sick to your stomach (nauseous) or throw up (vomit) for more than 6 hours.  Your blood sugar level is over 240 mg/dL.  There is a change in mental status.  You get another serious illness.  You have watery poop (diarrhea) for more than 6 hours.  You have been  sick or have had a fever for 2 or more days and are not getting better.  You have pain when you are physically active. MAKE SURE YOU:  Understand these instructions.  Will watch your condition.  Will get help right away if you are not doing well or get worse. Document Released: 10/18/2007 Document Revised: 10/29/2012 Document Reviewed: 05/09/2012 [facility] Patient Information 2014 [place], [place].  DASH Diet The DASH diet stands for "Dietary Approaches to Stop Hypertension." It is a healthy eating plan that has been shown to reduce high blood pressure (hypertension) in as little as 14 days, while also possibly providing other significant health benefits. These other health benefits include reducing the risk of breast cancer after menopause and reducing the risk of type 2 diabetes, heart disease, colon cancer, and stroke. Health benefits also include weight loss and slowing kidney failure in patients with chronic kidney disease.  DIET GUIDELINES  Limit salt (sodium). Your diet should contain less than 1500 mg of sodium daily.  Limit refined or processed carbohydrates. Your diet should include mostly whole grains. Desserts and added sugars should be used sparingly.  Include small amounts of heart-healthy fats. These types of fats include nuts, oils, and tub margarine. Limit saturated and trans fats. These fats have been shown to be harmful in the body. CHOOSING FOODS  The following food groups are based on  a 2000 calorie diet. See your Registered Dietitian for individual calorie needs. Grains and Grain Products (6 to 8 servings daily)  Eat More Often: Whole-wheat bread, brown rice, whole-grain or wheat pasta, quinoa, popcorn without added fat or salt (air popped).  Eat Less Often: White bread, white pasta, white rice, cornbread. Vegetables (4 to 5 servings daily)  Eat More Often: Fresh, frozen, and canned vegetables. Vegetables may be raw, steamed, roasted, or grilled with a minimal  amount of fat.  Eat Less Often/Avoid: Creamed or fried vegetables. Vegetables in a cheese sauce. Fruit (4 to 5 servings daily)  Eat More Often: All fresh, canned (in natural juice), or frozen fruits. Dried fruits without added sugar. One hundred percent fruit juice ( cup [237 mL] daily).  Eat Less Often: Dried fruits with added sugar. Canned fruit in light or heavy syrup. [employer], Fish, and Poultry (2 servings or less daily. One serving is 3 to 4 oz [85-114 g]).  Eat More Often: Ninety percent or leaner ground beef, tenderloin, sirloin. Round cuts of beef, chicken breast, [place] breast. All fish. Grill, bake, or broil your meat. Nothing should be fried.  Eat Less Often/Avoid: Fatty cuts of meat, [place], or chicken leg, thigh, or wing. Fried cuts of meat or fish. Dairy (2 to 3 servings)  Eat More Often: Low-fat or fat-free milk, low-fat plain or light yogurt, reduced-fat or part-skim cheese.  Eat Less Often/Avoid: Milk (whole, 2%).Whole milk yogurt. Full-fat cheeses. Nuts, Seeds, and Legumes (4 to 5 servings per week)  Eat More Often: All without added salt.  Eat Less Often/Avoid: Salted nuts and seeds, canned beans with added salt. Fats and Sweets (limited)  Eat More Often: Vegetable oils, tub margarines without trans fats, sugar-free gelatin. Mayonnaise and salad dressings.  Eat Less Often/Avoid: Coconut oils, palm oils, butter, stick margarine, cream, half and half, cookies, candy, pie. FOR MORE INFORMATION The Dash Diet Eating Plan: www.dashdiet.org Document Released: 12/28/2010 Document Revised: 04/02/2011 Document Reviewed: 12/28/2010 [facility] Patient Information 2014 [place], [place].  Hypertension As your heart beats, it forces blood through your arteries. This force is your blood pressure. If the pressure is too high, it is called hypertension (HTN) or high blood pressure. HTN is dangerous because you may have it and not know it. High blood pressure may mean that your  heart has to work harder to pump blood. Your arteries may be narrow or stiff. The extra work puts you at risk for heart disease, stroke, and other problems.  Blood pressure consists of two numbers, a higher number over a lower, 110/72, for example. It is stated as "110 over 72." The ideal is below 120 for the top number (systolic) and under 80 for the bottom (diastolic). Write down your blood pressure today. You should pay close attention to your blood pressure if you have certain conditions such as:  Heart failure.  Prior heart attack.  Diabetes  Chronic kidney disease.  Prior stroke.  Multiple risk factors for heart disease. To see if you have HTN, your blood pressure should be measured while you are seated with your arm held at the level of the heart. It should be measured at least twice. A one-time elevated blood pressure reading (especially in the Emergency Department) does not mean that you need treatment. There may be conditions in which the blood pressure is different between your right and left arms. It is important to see your caregiver soon for a recheck. Most people have essential hypertension which means that there  is not a specific cause. This type of high blood pressure may be lowered by changing lifestyle factors such as:  Stress.  Smoking.  Lack of exercise.  Excessive weight.  Drug/tobacco/alcohol use.  Eating less salt. Most people do not have symptoms from high blood pressure until it has caused damage to the body. Effective treatment can often prevent, delay or reduce that damage. TREATMENT  When a cause has been identified, treatment for high blood pressure is directed at the cause. There are a large number of medications to treat HTN. These fall into several categories, and your caregiver will help you select the medicines that are best for you. Medications may have side effects. You should review side effects with your caregiver. If your blood pressure stays  high after you have made lifestyle changes or started on medicines,   Your medication(s) may need to be changed.  Other problems may need to be addressed.  Be certain you understand your prescriptions, and know how and when to take your medicine.  Be sure to follow up with your caregiver within the time frame advised (usually within two weeks) to have your blood pressure rechecked and to review your medications.  If you are taking more than one medicine to lower your blood pressure, make sure you know how and at what times they should be taken. Taking two medicines at the same time can result in blood pressure that is too low. SEEK IMMEDIATE MEDICAL CARE IF:  You develop a severe headache, blurred or changing vision, or confusion.  You have unusual weakness or numbness, or a faint feeling.  You have severe chest or abdominal pain, vomiting, or breathing problems. MAKE SURE YOU:   Understand these instructions.  Will watch your condition.  Will get help right away if you are not doing well or get worse. Document Released: 01/08/2005 Document Revised: 04/02/2011 Document Reviewed: 08/29/2007 [facility] Patient Information 2014 [place].   Hydrocortisone skin cream, ointment, lotion, or solution What is this medicine? HYDROCORTISONE (hye droe KOR ti sone) is a corticosteroid. It is used on the skin to reduce swelling, redness, itching, and allergic reactions. This medicine may be used for other purposes; ask your health care provider or pharmacist if you have questions. COMMON BRAND NAME(S): Ala-Cort, Ala-Scalp, Aqua Glycolic HC, Balneol for Her, Caldecort , Cetacort, Cortaid Advanced, Cortaid Intensive Therapy, Cortaid Sensitive Skin, Cortaid, CortAlo, Corticaine, Corticool, Cortizone, Cortizone-10 Cooling Relief, Cortizone-10 Intensive Healing, Cortizone-10 Plus, Cortizone-10, Dermarest Dricort, Dermarest Eczema, DERMASORB HC Complete, Gly-Cort , Hycort, Hydroskin , Hytone,  Instacort, Lacticare HC, Locoid Lipocream, Locoid, Neosporin Eczema, NuCort , Nutracort, NuZon, Pandel, Pediaderm HC, Penecort, Preparation H Hydrocortisone, Rederm, Sarnol-HC, Scalacort, Scalpicin Anti-Itch, Texacort, Tucks HC, Westcort What should I tell my health care provider before I take this medicine? They need to know if you have any of these conditions: -any active infection -diabetes -large areas of burned or damaged skin -skin wasting or thinning -an unusual or allergic reaction to hydrocortisone, corticosteroids, sulfites, other medicines, foods, dyes, or preservatives -pregnant or trying to get pregnant -breast-feeding How should I use this medicine? This medicine is for external use only. Do not take by mouth. Follow the directions on the prescription label. Wash your hands before and after use. Apply a thin film of medicine to the affected area. Do not cover with a bandage or dressing unless your doctor or health care professional tells you to. Do not use on healthy skin or over large areas of skin. Do not get  this medicine in your eyes. If you do, rinse out with plenty of cool tap water. Do not to use more medicine than prescribed. Do not use your medicine more often than directed or for more than 14 days. Talk to your pediatrician regarding the use of this medicine in children. Special care may be needed. While this drug may be prescribed for children as young as 59 years of age for selected conditions, precautions do apply. Do not use this medicine for the treatment of diaper rash unless directed to do so by your doctor or health care professional. If applying this medicine to the diaper area of a child, do not cover with tight-fitting diapers or plastic pants. This may increase the amount of medicine that passes through the skin and increase the risk of serious side effects. Elderly patients are more likely to have damaged skin through aging, and this may increase side effects. This  medicine should only be used for brief periods and infrequently in older patients. Overdosage: If you think you have taken too much of this medicine contact a poison control center or emergency room at once. NOTE: This medicine is only for you. Do not share this medicine with others. What if I miss a dose? If you miss a dose, use it as soon as you can. If it is almost time for your next dose, use only that dose. Do not use double or extra doses. What may interact with this medicine? Interactions are not expected. Do not use any other skin products on the affected area without asking your doctor or health care professional. This list may not describe all possible interactions. Give your health care provider a list of all the medicines, herbs, non-prescription drugs, or dietary supplements you use. Also tell them if you smoke, drink alcohol, or use illegal drugs. Some items may interact with your medicine. What should I watch for while using this medicine? Tell your doctor or health care professional if your symptoms do not start to get better within 7 days or if they get worse. Tell your doctor or health care professional if you are exposed to anyone with measles or chickenpox, or if you develop sores or blisters that do not heal properly. What side effects may I notice from receiving this medicine? Side effects that you should report to your doctor or health care professional as soon as possible: -allergic reactions like skin rash, itching or hives, swelling of the face, lips, or tongue -burning feeling on the skin -dark red spots on the skin -infection -lack of healing of skin condition -painful, red, pus filled blisters in hair follicles -thinning of the skin Side effects that usually do not require medical attention (report to your doctor or health care professional if they continue or are bothersome): -dry skin, irritation -unusual increased growth of hair on the face or body This list may  not describe all possible side effects. Call your doctor for medical advice about side effects. You may report side effects to FDA at 1-800-FDA-1088. Where should I keep my medicine? Keep out of the reach of children. Store at room temperature between 15 and 30 degrees C (59 and 86 degrees F). Do not freeze. Throw away any unused medicine after the expiration date. NOTE: This sheet is a summary. It may not cover all possible information. If you have questions about this medicine, talk to your doctor, pharmacist, or health care provider.  2014, [employer]vier/[employer] Standard. (2007-05-23 16:08:48)

## 2013-05-01 NOTE — Progress Notes (Signed)
Case discussed with Dr. McLean at time of visit.  We reviewed the resident's history and exam and pertinent patient test results.  I agree with the assessment, diagnosis, and plan of care documented in the resident's note.  

## 2013-05-08 ENCOUNTER — Ambulatory Visit (INDEPENDENT_AMBULATORY_CARE_PROVIDER_SITE_OTHER): Payer: No Typology Code available for payment source | Admitting: Internal Medicine

## 2013-05-08 ENCOUNTER — Encounter: Payer: Self-pay | Admitting: Internal Medicine

## 2013-05-08 VITALS — BP 148/84 | HR 100 | Temp 98.8°F | Ht 60.0 in | Wt 218.6 lb

## 2013-05-08 DIAGNOSIS — E785 Hyperlipidemia, unspecified: Secondary | ICD-10-CM

## 2013-05-08 DIAGNOSIS — R21 Rash and other nonspecific skin eruption: Secondary | ICD-10-CM

## 2013-05-08 DIAGNOSIS — E1139 Type 2 diabetes mellitus with other diabetic ophthalmic complication: Secondary | ICD-10-CM

## 2013-05-08 LAB — GLUCOSE, CAPILLARY: Glucose-Capillary: 258 mg/dL — ABNORMAL HIGH (ref 70–99)

## 2013-05-08 MED ORDER — METHYLPREDNISOLONE (PAK) 4 MG PO TABS: ORAL_TABLET | ORAL | Status: AC

## 2013-05-08 MED ORDER — HYDROCORTISONE 2.5 % EX LOTN: TOPICAL_LOTION | CUTANEOUS | Status: DC

## 2013-05-08 MED ORDER — PERMETHRIN 1 % EX LOTN: TOPICAL_LOTION | CUTANEOUS | Status: AC

## 2013-05-08 MED ORDER — ATORVASTATIN CALCIUM 10 MG PO TABS: 10.0000 mg | ORAL_TABLET | Freq: Every evening | ORAL | Status: DC

## 2013-05-08 MED ORDER — HYDROXYZINE HCL 25 MG PO TABS: 25.0000 mg | ORAL_TABLET | Freq: Three times a day (TID) | ORAL | Status: DC | PRN

## 2013-05-08 NOTE — Patient Instructions (Addendum)
Continue to use sarna if it is helping  Hydroxyzine take at night it will make your sleep  Use Permetherin once then repeat it not helping  Follow up with dermatology 06/11/13 Hydroxyzine capsules or tablets Follow up with the clinic in 1-2 weeks sooner if needed What is this medicine? HYDROXYZINE (hye Pacheco i zeen) is an antihistamine. This medicine is used to treat allergy symptoms. It is also used to treat anxiety and tension. This medicine can be used with other medicines to induce sleep before surgery. This medicine may be used for other purposes; ask your health care provider or pharmacist if you have questions. COMMON BRAND NAME(S): ANX , Atarax, Rezine, Vistaril What should I tell my health care provider before I take this medicine? They need to know if you have any of these conditions: -any chronic illness -difficulty passing urine -glaucoma -heart disease -kidney disease -liver disease -lung disease -an unusual or allergic reaction to hydroxyzine, cetirizine, other medicines, foods, dyes, or preservatives -pregnant or trying to get pregnant -breast-feeding How should I use this medicine? Take this medicine by mouth with a full glass of water. Follow the directions on the prescription label. You may take this medicine with food or on an empty stomach. Take your medicine at regular intervals. Do not take your medicine more often than directed. Talk to your pediatrician regarding the use of this medicine in children. Special care may be needed. While this drug may be prescribed for children as young as 85 years of age for selected conditions, precautions do apply. Patients over 38 years old may have a stronger reaction and need a smaller dose. Overdosage: If you think you have taken too much of this medicine contact a poison control center or emergency room at once. NOTE: This medicine is only for you. Do not share this medicine with others. What if I miss a dose? If you miss a dose,  take it as soon as you can. If it is almost time for your next dose, take only that dose. Do not take double or extra doses. What may interact with this medicine? -alcohol -barbiturate medicines for sleep or seizures -medicines for colds, allergies -medicines for depression, anxiety, or emotional disturbances -medicines for pain -medicines for sleep -muscle relaxants This list may not describe all possible interactions. Give your health care provider a list of all the medicines, herbs, non-prescription drugs, or dietary supplements you use. Also tell them if you smoke, drink alcohol, or use illegal drugs. Some items may interact with your medicine. What should I watch for while using this medicine? Tell your doctor or health care professional if your symptoms do not improve. You may get drowsy or dizzy. Do not drive, use machinery, or do anything that needs mental alertness until you know how this medicine affects you. Do not stand or sit up quickly, especially if you are an older patient. This reduces the risk of dizzy or fainting spells. Alcohol may interfere with the effect of this medicine. Avoid alcoholic drinks. Your mouth may get dry. Chewing sugarless gum or sucking hard candy, and drinking plenty of water may help. Contact your doctor if the problem does not go away or is severe. This medicine may cause dry eyes and blurred vision. If you wear contact lenses you may feel some discomfort. Lubricating drops may help. See your eye doctor if the problem does not go away or is severe. If you are receiving skin tests for allergies, tell your doctor you are using  this medicine. What side effects may I notice from receiving this medicine? Side effects that you should report to your doctor or health care professional as soon as possible: -fast or irregular heartbeat -difficulty passing urine -seizures -slurred speech or confusion -tremor Side effects that usually do not require medical  attention (report to your doctor or health care professional if they continue or are bothersome): -constipation -drowsiness -fatigue -headache -stomach upset This list may not describe all possible side effects. Call your doctor for medical advice about side effects. You may report side effects to FDA at 1-800-FDA-1088. Where should I keep my medicine? Keep out of the reach of children. Store at room temperature between 15 and 30 degrees C (59 and 86 degrees F). Keep container tightly closed. Throw away any unused medicine after the expiration date. NOTE: This sheet is a summary. It may not cover all possible information. If you have questions about this medicine, talk to your doctor, pharmacist, or health care provider.  2014, [employer]/Gold Standard. (2007-05-23 14:50:59)  Scabies Scabies are small bugs (mites) that burrow under the skin and cause red bumps and severe itching. These bugs can only be seen with a microscope. Scabies are highly contagious. They can spread easily from person to person by direct contact. They are also spread through sharing clothing or linens that have the scabies mites living in them. It is not unusual for an entire family to become infected through shared towels, clothing, or bedding.  HOME CARE INSTRUCTIONS   Your caregiver may prescribe a cream or lotion to kill the mites. If cream is prescribed, massage the cream into the entire body from the neck to the bottom of both feet. Also massage the cream into the scalp and face if your child is less than 27 year old. Avoid the eyes and mouth. Do not wash your hands after application.  Leave the cream on for 8 to 12 hours. Your child should bathe or shower after the 8 to 12 hour application period. Sometimes it is helpful to apply the cream to your child right before bedtime.  One treatment is usually effective and will eliminate approximately 95% of infestations. For severe cases, your caregiver may decide to repeat  the treatment in 1 week. Everyone in your household should be treated with one application of the cream.  New rashes or burrows should not appear within 24 to 48 hours after successful treatment. However, the itching and rash may last for 2 to 4 weeks after successful treatment. Your caregiver may prescribe a medicine to help with the itching or to help the rash go away more quickly.  Scabies can live on clothing or linens for up to 3 days. All of your child's recently used clothing, towels, stuffed toys, and bed linens should be washed in hot water and then dried in a dryer for at least 20 minutes on high heat. Items that cannot be washed should be enclosed in a plastic bag for at least 3 days.  To help relieve itching, bathe your child in a cool bath or apply cool washcloths to the affected areas.  Your child may return to school after treatment with the prescribed cream. SEEK MEDICAL CARE IF:   The itching persists longer than 4 weeks after treatment.  The rash spreads or becomes infected. Signs of infection include red blisters or yellow-tan crust. Document Released: 01/08/2005 Document Revised: 04/02/2011 Document Reviewed: 05/19/2008 [facility] Patient Information 2014 [place].  Permethrin lotion What is this medicine? PERMETHRIN (  per METH rin) is used to treat head lice infestations. It acts by destroying both the lice and their eggs. This medicine may be used for other purposes; ask your health care provider or pharmacist if you have questions. What should I tell my health care provider before I take this medicine? They need to know if you have any of these conditions: -asthma -an unusual or allergic reaction to permethrin, veterinary or household insecticides, other medicines, chrysanthemums, foods, dyes, or preservatives -pregnant or trying to get pregnant -breast-feeding How should I use this medicine? This medicine is for external use only. Do not take by mouth. Shampoo  your hair with regular shampoo, rinse and towel dry. Do NOT use a shampoo with a conditioner. Shake well before applying. Apply enough medicine to your hair to wet the hair and scalp (usually about 2 tablespoons), and thoroughly rub the medicine into your hair and scalp. Make sure you get behind the ears and on the back of the neck. Keep this medicine away from your eyes. If you accidentally get some in your eyes, rinse your eyes with water right away. Leave on your hair for 10 minutes, unless directed otherwise by your doctor or health care professional. Then, rinse thoroughly with water. Dry with a clean towel. When your hair is dry, comb it with a fine toothed comb to remove any leftover nits (eggs) or nit shells. If you still have lice after one week, see your doctor or health care professional. You may need a second treatment. If you are applying this medicine to another person, wear plastic or disposable gloves to protect yourself from infestation. Talk to your pediatrician regarding the use of this medicine in children. While this drug may be prescribed for children as young as 31 months old for selected conditions, precautions do apply. Overdosage: If you think you have taken too much of this medicine contact a poison control center or emergency room at once. NOTE: This medicine is only for you. Do not share this medicine with others. What if I miss a dose? This does not apply. What may interact with this medicine? Interactions are not expected. Do not use any other skin products on the affected area without telling your doctor or health care professional. This list may not describe all possible interactions. Give your health care provider a list of all the medicines, herbs, non-prescription drugs, or dietary supplements you use. Also tell them if you smoke, drink alcohol, or use illegal drugs. Some items may interact with your medicine. What should I watch for while using this medicine? This  medicine is used as a single application treatment. If live lice are observed 7 or more days after initial application, a second treatment may be needed. Head lice can be spread from one person to another by direct contact with clothing, hats, scarves, bedding, towels, washcloths, hairbrushes, and combs. All members of your household should be examined for head lice and should receive treatment if they are found to be infected. If you have any questions about this, check with your doctor or health care professional. To prevent reinfection or spreading of the infection, the following steps should be taken: Machine wash all clothing, bedding, towels, and washcloths in very hot water and dry them using the hot cycle of a dryer for at least 20 minutes. Clothing or bedding that cannot be washed should be dry cleaned or sealed in an airtight plastic bag for 2 weeks. Shampoo any wigs or hairpieces. You should also  wash all hairbrushes and combs in very hot soapy water (above 130 degrees F) for 5 to 10 minutes. Do not share your hairbrushes or combs with other people. Wash all toys in very hot water (above 130 degrees F) for 5 to 10 minutes or seal in an airtight plastic bag for 2 weeks. Also, clean the house or room by vacuuming furniture, rugs, and floors. What side effects may I notice from receiving this medicine? Side effects that usually do not require medical attention (report to your doctor or health care professional if they continue or are bothersome): -itching -redness or mild swelling of the scalp -stinging or burning -tingling sensation This list may not describe all possible side effects. Call your doctor for medical advice about side effects. You may report side effects to FDA at 1-800-FDA-1088. Where should I keep my medicine? Keep out of the reach of children. Store at room temperature away from heat and direct light. Do not refrigerate or freeze. After treatment, throw away any unused  medicine. NOTE: This sheet is a summary. It may not cover all possible information. If you have questions about this medicine, talk to your doctor, pharmacist, or health care provider.  2014, Elsevier/Gold Standard. (2007-08-07 14:00:51)  Methylprednisolone tablets What is this medicine? METHYLPREDNISOLONE (meth ill pred NISS oh lone) is a corticosteroid. It is commonly used to treat inflammation of the skin, joints, lungs, and other organs. Common conditions treated include asthma, allergies, and arthritis. It is also used for other conditions, such as blood disorders and diseases of the adrenal glands. This medicine may be used for other purposes; ask your health care provider or pharmacist if you have questions. COMMON BRAND NAME(S): Medrol Dosepak, Medrol What should I tell my health care provider before I take this medicine? They need to know if you have any of these conditions: -Cushing's syndrome -diabetes -glaucoma -heart problems or disease -high blood pressure -infection such as herpes, measles, tuberculosis, or chickenpox -kidney disease -liver disease -mental problems -myasthenia gravis -osteoporosis -seizures -stomach ulcer or intestine disease including colitis and diverticulitis -thyroid problem -an unusual or allergic reaction to lactose, methylprednisolone, other medicines, foods, dyes, or preservatives -pregnant or trying to get pregnant -breast-feeding How should I use this medicine? Take this medicine by mouth with a drink of water. Follow the directions on the prescription label. Take it with food or milk to avoid stomach upset. If you are taking this medicine once a day, take it in the morning. Do not take more medicine than you are told to take. Do not suddenly stop taking your medicine because you may develop a severe reaction. Your doctor will tell you how much medicine to take. If your doctor wants you to stop the medicine, the dose may be slowly lowered over  time to avoid any side effects. Talk to your pediatrician regarding the use of this medicine in children. Special care may be needed. Overdosage: If you think you have taken too much of this medicine contact a poison control center or emergency room at once. NOTE: This medicine is only for you. Do not share this medicine with others. What if I miss a dose? If you miss a dose, take it as soon as you can. If it is almost time for your next dose, talk to your doctor or health care professional. You may need to miss a dose or take an extra dose. Do not take double or extra doses without advice. What may interact with this medicine? Do not  take this medicine with any of the following medications: -mifepristone This medicine may also interact with the following medications: -tacrolimus -vaccines -warfarin This list may not describe all possible interactions. Give your health care provider a list of all the medicines, herbs, non-prescription drugs, or dietary supplements you use. Also tell them if you smoke, drink alcohol, or use illegal drugs. Some items may interact with your medicine. What should I watch for while using this medicine? Visit your doctor or health care professional for regular checks on your progress. If you are taking this medicine for a long time, carry an identification card with your name and address, the type and dose of your medicine, and your doctor's name and address. The medicine may increase your risk of getting an infection. Stay away from people who are sick. Tell your doctor or health care professional if you are around anyone with measles or chickenpox. If you are going to have surgery, tell your doctor or health care professional that you have taken this medicine within the last twelve months. Ask your doctor or health care professional about your diet. You may need to lower the amount of salt you eat. The medicine can increase your blood sugar. If you are a diabetic  check with your doctor if you need help adjusting the dose of your diabetic medicine. What side effects may I notice from receiving this medicine? Side effects that you should report to your doctor or health care professional as soon as possible: -allergic reactions like skin rash, itching or hives, swelling of the face, lips, or tongue -eye pain, decreased or blurred vision, or bulging eyes -fever, sore throat, sneezing, cough, or other signs of infection, wounds that will not heal -increased thirst -mental depression, mood swings, mistaken feelings of self importance or of being mistreated -pain in hips, back, ribs, arms, shoulders, or legs -swelling of the ankles, feet, hands -trouble passing urine or change in the amount of urine Side effects that usually do not require medical attention (report to your doctor or health care professional if they continue or are bothersome): -confusion, excitement, restlessness -headache -nausea, vomiting -skin problems, acne, thin and shiny skin -weight gain This list may not describe all possible side effects. Call your doctor for medical advice about side effects. You may report side effects to FDA at 1-800-FDA-1088. Where should I keep my medicine? Keep out of the reach of children. Store at room temperature between 20 and 25 degrees C (68 and 77 degrees F). Throw away any unused medicine after the expiration date. NOTE: This sheet is a summary. It may not cover all possible information. If you have questions about this medicine, talk to your doctor, pharmacist, or health care provider.  2014, Elsevier/[employer] Standard. (2011-10-09 11:38:34)

## 2013-05-10 ENCOUNTER — Encounter: Payer: Self-pay | Admitting: Internal Medicine

## 2013-05-10 NOTE — Assessment & Plan Note (Signed)
Rx refill of Lipitor sent to pharmacy

## 2013-05-10 NOTE — Assessment & Plan Note (Addendum)
Hyperpigmented to violaceous macules, pruritic and excoriated to chest, back, abdomen, arms, axillae, legs.   Unclear etiology Ddx viral exanthum, pityriasis rosea, ? Drug reaction (no new medications but pt's medications i.e glipizide can cause drug rxn), scabies Continue Sarna, Benadryl prn, Rx Atarax 25 mg tid prn (rec take qhs may make sleep), Rx HC 2.5 lotion, Medrol dose pack, tx with Permethrin x 1  F/u with Dermatology 05/12/13  Dr. Beryle Beams saw and evaluated the patient as well

## 2013-05-10 NOTE — Progress Notes (Signed)
   Subjective:    Patient ID: Sandra [name], female    DOB: [birth year], 48 y.o.   MRN: [id number]  HPI Comments: 48 y.o Past Medical History diabetes mellitus 2 (HA1C 8.5, cbg 258), HSV,  Migraine, HTN (BP 148/84),  HYPERLIPIDEMIA,  Menorrhagia, Asthma, Carpal tunnel syndrome, acute bronchitis           She presents for: 1)  She came on 04/29/13 for itchy bumps started on arms after a bite from a black bug on 04/25/13(?appearance).  Now the rash last week spread to her chest and back.  She tried [place] which helps with the itching temporarily, hydrocorticone cream which has helped with itching temporarily and benadryl which helps with the rash temporarily.  The rash started out as bumps and now is spreading to underarms, worsening to breast, legs, abdomen, and back. She is unable to sleep at night due to itching.  Hot water showers make the itching worse.  Her husband is not itching at home and they are sitting in the same bed.  She has gotten rid of her couch and linen at home.  She remembers sitting on a family members couch with a dog and also another relatives bed recently.  She made herself an appt with dermatology 05/12/13.    2) HLD-She needs Rx refill of Lipitor         Review of Systems  Constitutional: Negative for fever and chills.  Gastrointestinal: Negative for nausea and vomiting.  Skin: Positive for rash.       Objective:   Physical Exam  Nursing note and vitals reviewed. Constitutional: She is oriented to person, place, and time. She appears well-developed and well-nourished. No distress.  HENT:  Head: Normocephalic and atraumatic.  Mouth/Throat: No oropharyngeal exudate.  Eyes: Conjunctivae are normal.  Cardiovascular: Normal rate, regular rhythm and normal heart sounds.   Pulmonary/Chest: Effort normal and breath sounds normal.  Abdominal: Soft. Bowel sounds are normal.  Neurological: She is alert and oriented to person, place, and time.  Skin: She is not  diaphoretic.  Pruritic violaceous to hyperpigmented macular areas to chest, axillae, legs, abdomen, back, arms. Some areas with collarette of scale at the border  No papules in digital web  Psychiatric: She has a normal mood and affect. Her speech is normal and behavior is normal. Judgment and thought content normal. Cognition and memory are normal.          Assessment & Plan:  F/u with Dermatology, then [facility] for chronic medical conditions in 3 months

## 2013-05-11 NOTE — Progress Notes (Signed)
I reviewed patient complaints and examined this patient along with resident physician Dr Karlyn Agee and I concur with management plan.  Idiopathic rash - appears viral in nature.  Symptomatic Rx with solumedrol dose pack pending Dermatology appt next week. Murriel Hopper, MD, [facility]  Hematology-Oncology/Internal Medicine

## 2013-05-13 ENCOUNTER — Other Ambulatory Visit: Payer: Self-pay | Admitting: *Deleted

## 2013-05-13 DIAGNOSIS — E1139 Type 2 diabetes mellitus with other diabetic ophthalmic complication: Secondary | ICD-10-CM

## 2013-05-14 MED ORDER — SITAGLIPTIN PHOSPHATE 100 MG PO TABS: 100.0000 mg | ORAL_TABLET | Freq: Every day | ORAL | Status: DC

## 2013-05-21 ENCOUNTER — Other Ambulatory Visit: Payer: Self-pay | Admitting: Internal Medicine

## 2013-07-07 ENCOUNTER — Ambulatory Visit (INDEPENDENT_AMBULATORY_CARE_PROVIDER_SITE_OTHER): Payer: No Typology Code available for payment source | Admitting: Internal Medicine

## 2013-07-07 ENCOUNTER — Encounter: Payer: Self-pay | Admitting: Internal Medicine

## 2013-07-07 VITALS — BP 142/74 | HR 91 | Temp 98.4°F

## 2013-07-07 DIAGNOSIS — R21 Rash and other nonspecific skin eruption: Secondary | ICD-10-CM

## 2013-07-07 DIAGNOSIS — I10 Essential (primary) hypertension: Secondary | ICD-10-CM

## 2013-07-07 DIAGNOSIS — E1165 Type 2 diabetes mellitus with hyperglycemia: Secondary | ICD-10-CM

## 2013-07-07 DIAGNOSIS — E1139 Type 2 diabetes mellitus with other diabetic ophthalmic complication: Secondary | ICD-10-CM

## 2013-07-07 LAB — BASIC METABOLIC PANEL WITH GFR
BUN: 11 mg/dL (ref 6–23)
CO2: 27 mEq/L (ref 19–32)
Calcium: 9.5 mg/dL (ref 8.4–10.5)
Chloride: 101 mEq/L (ref 96–112)
Creat: 0.75 mg/dL (ref 0.50–1.10)
GFR, Est African American: >89 mL/min
GFR, Est Non African American: >89 mL/min
Glucose, Bld: 281 mg/dL — ABNORMAL HIGH (ref 70–99)
Potassium: 4.1 mEq/L (ref 3.5–5.3)
Sodium: 137 mEq/L (ref 135–145)

## 2013-07-07 LAB — GLUCOSE, CAPILLARY: Glucose-Capillary: 296 mg/dL — ABNORMAL HIGH (ref 70–99)

## 2013-07-07 MED ORDER — GLIPIZIDE 10 MG PO TABS: 10.0000 mg | ORAL_TABLET | Freq: Two times a day (BID) | ORAL | Status: DC

## 2013-07-07 NOTE — Patient Instructions (Signed)
General Instructions: Please bring your glucometer with you on your next visit. I have called in the prescription for Glipizide.  Continue taking your medication as prescribed. Congrats on your blood pressure control.   Please bring your medicines with you each time you come to clinic.  Medicines may include prescription medications, over-the-counter medications, herbal remedies, eye drops, vitamins, or other pills.   Progress Toward Treatment Goals:  Treatment Goal 04/29/2013  Hemoglobin A1C deteriorated  Blood pressure at goal    Self Care Goals & Plans:  Self Care Goal 05/08/2013  Manage my medications take my medicines as prescribed; bring my medications to every visit; refill my medications on time; follow the sick day instructions if I am sick  Monitor my health keep track of my blood glucose; keep track of my blood pressure; check my feet daily  Eat healthy foods eat more vegetables; eat fruit for snacks and desserts; eat foods that are low in salt; drink diet soda or water instead of juice or soda  Be physically active find an activity I enjoy  Meeting treatment goals -    Home Blood Glucose Monitoring 04/29/2013  Check my blood sugar once a day  When to check my blood sugar N/A     Care Management & Community Referrals:  Referral 04/29/2013  Referrals made for care management support none needed  Referrals made to community resources none

## 2013-07-07 NOTE — Progress Notes (Signed)
Patient ID: Sandra [name], female   DOB: 1965-08-01, 48 y.o.   MRN: [id number]   Subjective:   Patient ID: Sandra [name] female   DOB: [birth year] 48 y.o.   MRN: [id number]  HPI: Sandra [name] is a 48 y.o. with PMH of HTN, Asthma, DM2. Presented today for routine follow up visit. No complaints today. Please see problem based charting for assessment and plan.  Past Medical History  Diagnosis Date  . Diabetes mellitus type 2, uncontrolled DX: 1999    Started as gestational diabetes  . HSV (herpes simplex virus) anogenital infection   . Migraine     Normal CT head (08/06/2006)  . ESSENTIAL HYPERTENSION 07/08/2006  . HYPERLIPIDEMIA 08/20/2007  . Menorrhagia   . Asthma     as a child  . Carpal tunnel syndrome    Current Outpatient Prescriptions  Medication Sig Dispense Refill  . albuterol (PROVENTIL HFA;VENTOLIN HFA) 108 (90 BASE) MCG/ACT inhaler Inhale 2 puffs into the lungs every 6 (six) hours as needed for wheezing.  1 Inhaler  0  . atorvastatin (LIPITOR) 10 MG tablet Take 1 tablet (10 mg total) by mouth every evening.  30 tablet  2  . benzonatate (TESSALON PERLES) 100 MG capsule Take 2 capsules (200 mg total) by mouth 3 (three) times daily as needed for cough.  30 capsule  1  . Chlorphen-PE-Acetaminophen (CORICIDIN D COLD/FLU/SINUS PO) Take 2 tablets by mouth every 6 (six) hours as needed (for cough).      Marland Kitchen dextromethorphan-guaiFENesin (TUSSIN DM) 10-100 MG/5ML liquid Take 5 mLs by mouth every 4 (four) hours as needed for cough.  180 mL  0  . eletriptan (RELPAX) 20 MG tablet Take 1 tablet (20 mg total) by mouth as needed for migraine (may repeat in 2 hours if necessary). Repeat only once if needed.  18 tablet  2  . glipiZIDE (GLUCOTROL) 10 MG tablet Take 1 tablet (10 mg total) by mouth 2 (two) times daily before a meal.  180 tablet  2  . hydrocortisone 2.5 % lotion Apply to affected area 2 times daily.  60 mL  0  . hydrOXYzine (ATARAX/VISTARIL) 25 MG tablet Take 1 tablet (25  mg total) by mouth 3 (three) times daily as needed for itching.  90 tablet  0  . ibuprofen (ADVIL,MOTRIN) 800 MG tablet Take 1 tablet (800 mg total) by mouth every 8 (eight) hours as needed for pain.  30 tablet  1  . medroxyPROGESTERone (PROVERA) 10 MG tablet Take 2 tablets (20 mg total) by mouth daily. Can increase to two tablets twice a day for heavy bleeding  30 tablet  2  . metFORMIN (GLUCOPHAGE) 1000 MG tablet Take 1 tablet (1,000 mg total) by mouth 2 (two) times daily with a meal.  60 tablet  11  . methocarbamol (ROBAXIN) 500 MG tablet Take 1 tablet (500 mg total) by mouth 2 (two) times daily.  20 tablet  0  . permethrin (ELIMITE) 1 % lotion Apply to affected area once  60 mL  1  . propranolol (INDERAL) 20 MG tablet Take 1 tablet (20 mg total) by mouth 2 (two) times daily.  60 tablet  2  . quinapril-hydrochlorothiazide (ACCURETIC) 20-12.5 MG per tablet TAKE ONE TABLET BY MOUTH ONCE DAILY  30 tablet  12  . sitaGLIPtin (JANUVIA) 100 MG tablet Take 1 tablet (100 mg total) by mouth daily.  90 tablet  0  . sodium chloride (OCEAN) 0.65 % SOLN nasal spray Place 1 spray  into both nostrils as needed for congestion.  60 mL  0   No current facility-administered medications for this visit.   Family History  Problem Relation Age of Onset  . Hypertension Father   . Diabetes Father    History   Social History  . Marital Status: Married    Spouse Name: N/A    Number of Children: N/A  . Years of Education: N/A   Occupational History  . Daycare teacher    Social History Main Topics  . Smoking status: Never Smoker   . Smokeless tobacco: Never Used  . Alcohol Use: No  . Drug Use: No  . Sexual Activity: Yes    Birth Control/ Protection: Surgical   Other Topics Concern  . None   Social History Narrative   Studying at [employer] in early childhood education.   Married.   Review of Systems: CONSTITUTIONAL- No Fever, weightloss, night sweat or change in appetite. SKIN- Had a rash previous visit,  reffered to dermatoogy- was diagnosed with Psoriasis, was given a cream to apply . HEAD- Complaints of migraine Headaches which she has a hx of, red flags, no dizziness. EYES- No Vision changes. RESPIRATORY- No Cough or SOB. CARDIAC- No chest pain. GI- No nausea, vomiting, diarrhoea, constipation, abd pain. URINARY- No Frequency, or dysuria. NEUROLOGIC- No Numbness, syncope, seizures or burning. [facility]- Denies depression or anxiety.  Objective:  Physical Exam: Filed Vitals:   07/07/13 1406  BP: 142/74  Pulse: 91  Temp: 98.4 F (36.9 C)  TempSrc: Oral   GENERAL- alert, co-operative, appears as stated age, not in any distress. HEENT- Atraumatic, normocephalic, PERRL, EOMI, oral mucosa appears moist, neck supple. CARDIAC- RRR, no murmurs, rubs or gallops. RESP- No wheezes or crackles. ABDOMEN- Soft, nontender, bowel sounds present. NEURO- No obvious Cr N abnormality, strenght upper and lower extremities- 5/5,  Gait- Normal. EXTREMITIES- pulse 2+, symmetric, no pedal edema. SKIN- Warm, dry, No rash or lesion. PSYCH- Normal mood and affect, appropriate thought content and speech.  Assessment & Plan:   The patient's case and plan of care was discussed with attending physician, Dr. Orie Fisherman.  Please see problem based charting for assessment and plan.

## 2013-07-08 NOTE — Assessment & Plan Note (Signed)
BP Readings from Last 3 Encounters:  07/07/13 142/74  05/08/13 148/84  04/29/13 138/75    Lab Results  Component Value Date   NA 137 07/07/2013   K 4.1 07/07/2013   CREATININE 0.75 07/07/2013    Assessment: Blood pressure control:  Controlled Progress toward BP goal:   At goal Comments: Complaint with propanolol 20mg  BID, Accuretic- 1 tab daily.  Plan: Medications:  continue current medications Educational resources provided:   Self management tools provided:   Other plans: Congratulated on adherrence to meds and good blood pressure control.

## 2013-07-08 NOTE — Progress Notes (Signed)
INTERNAL MEDICINE TEACHING ATTENDING ADDENDUM - Aldine Contes, MD: I reviewed and discussed with the resident Dr. Denton Brick, the patient's medical history, physical examination, diagnosis and results of pertinent tests and treatment and I agree with the patient's care as documented.

## 2013-07-08 NOTE — Assessment & Plan Note (Addendum)
Pt was reffered to dermatology for a Rash- when she presented to thye clinic in April, she says she was diagnosed with psoriasis. Was given a cream to apply. Can not remember the name. Told to bring thsi med at her next visit.

## 2013-07-08 NOTE — Assessment & Plan Note (Addendum)
Lab Results  Component Value Date   HGBA1C 8.5 04/29/2013   HGBA1C 7.8 12/03/2012   HGBA1C 7.3 05/29/2012     Assessment: Diabetes control:  Uncontrolled Progress toward A1C goal:   Not at goal, goal- <7.  Comments: Says she is complaint with her meds. Metformin- 1000mg  BID, Sitagliptin 100mg  daily, and glipizide 10mg  daily.  Did not bring glucometer today.  Plan: Medications:Cont current meds. Home glucose monitoring: Frequency:  Once a day. Other plans: Counselled on adherence, and to bring her glucometer on her next visit.

## 2013-08-19 ENCOUNTER — Other Ambulatory Visit: Payer: Self-pay | Admitting: Internal Medicine

## 2013-09-24 ENCOUNTER — Other Ambulatory Visit: Payer: Self-pay | Admitting: Internal Medicine

## 2013-10-06 ENCOUNTER — Other Ambulatory Visit: Payer: Self-pay | Admitting: Internal Medicine

## 2013-10-06 NOTE — Progress Notes (Signed)
Pharmacy- [facility], sent a faxed message saying pt has not been refilling Metformin- 1000mg  BID, discuss with pt at next visit, might explain pt increased Hgba1c- 8.5 compared to prior- 7.8, and 7.3 . As far as we know pt is still on this medication.  Sandra Neighbors, MD. Graciella Freer. PGY-2.

## 2013-10-08 ENCOUNTER — Telehealth: Payer: Self-pay | Admitting: *Deleted

## 2013-10-08 ENCOUNTER — Encounter: Payer: Self-pay | Admitting: Internal Medicine

## 2013-10-08 ENCOUNTER — Ambulatory Visit (INDEPENDENT_AMBULATORY_CARE_PROVIDER_SITE_OTHER): Payer: No Typology Code available for payment source | Admitting: Internal Medicine

## 2013-10-08 VITALS — BP 136/79 | HR 95 | Temp 98.2°F | Ht 60.0 in | Wt 210.0 lb

## 2013-10-08 DIAGNOSIS — B349 Viral infection, unspecified: Secondary | ICD-10-CM

## 2013-10-08 DIAGNOSIS — B9789 Other viral agents as the cause of diseases classified elsewhere: Secondary | ICD-10-CM

## 2013-10-08 DIAGNOSIS — E1139 Type 2 diabetes mellitus with other diabetic ophthalmic complication: Secondary | ICD-10-CM

## 2013-10-08 HISTORY — DX: Viral infection, unspecified: B34.9

## 2013-10-08 LAB — CBC WITH DIFFERENTIAL/PLATELET
Basophils Absolute: 0 10*3/uL (ref 0.0–0.1)
Basophils Relative: 0 % (ref 0–1)
Eosinophils Absolute: 0.2 10*3/uL (ref 0.0–0.7)
Eosinophils Relative: 2 % (ref 0–5)
HCT: 36.7 % (ref 36.0–46.0)
Hemoglobin: 12.4 g/dL (ref 12.0–15.0)
Lymphocytes Relative: 29 % (ref 12–46)
Lymphs Abs: 2.7 10*3/uL (ref 0.7–4.0)
MCH: 31.9 pg (ref 26.0–34.0)
MCHC: 33.8 g/dL (ref 30.0–36.0)
MCV: 94.3 fL (ref 78.0–100.0)
Monocytes Absolute: 0.8 10*3/uL (ref 0.1–1.0)
Monocytes Relative: 9 % (ref 3–12)
Neutro Abs: 5.5 10*3/uL (ref 1.7–7.7)
Neutrophils Relative %: 60 % (ref 43–77)
Platelets: 378 10*3/uL (ref 150–400)
RBC: 3.89 MIL/uL (ref 3.87–5.11)
RDW: 13.4 % (ref 11.5–15.5)
WBC: 9.2 10*3/uL (ref 4.0–10.5)

## 2013-10-08 LAB — COMPLETE METABOLIC PANEL WITH GFR
ALT: 15 U/L (ref 0–35)
AST: 14 U/L (ref 0–37)
Albumin: 4 g/dL (ref 3.5–5.2)
Alkaline Phosphatase: 96 U/L (ref 39–117)
BUN: 11 mg/dL (ref 6–23)
CO2: 26 mEq/L (ref 19–32)
Calcium: 9.7 mg/dL (ref 8.4–10.5)
Chloride: 103 mEq/L (ref 96–112)
Creat: 0.8 mg/dL (ref 0.50–1.10)
GFR, Est African American: >89 mL/min
GFR, Est Non African American: 89 mL/min
Glucose, Bld: 143 mg/dL — ABNORMAL HIGH (ref 70–99)
Potassium: 4.1 mEq/L (ref 3.5–5.3)
Sodium: 138 mEq/L (ref 135–145)
Total Bilirubin: 0.2 mg/dL (ref 0.2–1.2)
Total Protein: 6.9 g/dL (ref 6.0–8.3)

## 2013-10-08 LAB — GLUCOSE, CAPILLARY: Glucose-Capillary: 143 mg/dL — ABNORMAL HIGH (ref 70–99)

## 2013-10-08 LAB — POCT GLYCOSYLATED HEMOGLOBIN (HGB A1C): Hemoglobin A1C: 9.6

## 2013-10-08 MED ORDER — ALBUTEROL SULFATE HFA 108 (90 BASE) MCG/ACT IN AERS: 2.0000 | INHALATION_SPRAY | Freq: Four times a day (QID) | RESPIRATORY_TRACT | Status: DC | PRN

## 2013-10-08 MED ORDER — DEXTROMETHORPHAN-GUAIFENESIN 10-100 MG/5ML PO LIQD: 5.0000 mL | ORAL | Status: DC | PRN

## 2013-10-08 NOTE — Assessment & Plan Note (Signed)
HA1C worsening today ? Medication/diet compliance  Will have pt come back to address with PCP or another provider in 1 week

## 2013-10-08 NOTE — Patient Instructions (Signed)
Please continue to use throat spray, warm salt gargles Use albuterol inhaler and cough syrup as well  If not feeling well come back next week We will check labs and stool sample   Diarrhea Diarrhea is frequent loose and watery bowel movements. It can cause you to feel weak and dehydrated. Dehydration can cause you to become tired and thirsty, have a dry mouth, and have decreased urination that often is dark yellow. Diarrhea is a sign of another problem, most often an infection that will not last long. In most cases, diarrhea typically lasts 2-3 days. However, it can last longer if it is a sign of something more serious. It is important to treat your diarrhea as directed by your caregiver to lessen or prevent future episodes of diarrhea. CAUSES  Some common causes include:  Gastrointestinal infections caused by viruses, bacteria, or parasites.  Food poisoning or food allergies.  Certain medicines, such as antibiotics, chemotherapy, and laxatives.  Artificial sweeteners and fructose.  Digestive disorders. HOME CARE INSTRUCTIONS  Ensure adequate fluid intake (hydration): Have 1 cup (8 oz) of fluid for each diarrhea episode. Avoid fluids that contain simple sugars or sports drinks, fruit juices, whole milk products, and sodas. Your urine should be clear or pale yellow if you are drinking enough fluids. Hydrate with an oral rehydration solution that you can purchase at pharmacies, retail stores, and online. You can prepare an oral rehydration solution at home by mixing the following ingredients together:   - tsp table salt.   tsp baking soda.   tsp salt substitute containing potassium chloride.  1  tablespoons sugar.  1 L (34 oz) of water.  Certain foods and beverages may increase the speed at which food moves through the gastrointestinal (GI) tract. These foods and beverages should be avoided and include:  Caffeinated and alcoholic beverages.  High-fiber foods, such as raw fruits  and vegetables, nuts, seeds, and whole grain breads and cereals.  Foods and beverages sweetened with sugar alcohols, such as xylitol, sorbitol, and mannitol.  Some foods may be well tolerated and may help thicken stool including:  Starchy foods, such as rice, toast, pasta, low-sugar cereal, oatmeal, grits, baked potatoes, crackers, and bagels.  Bananas.  Applesauce.  Add probiotic-rich foods to help increase healthy bacteria in the GI tract, such as yogurt and fermented milk products.  Wash your hands well after each diarrhea episode.  Only take over-the-counter or prescription medicines as directed by your caregiver.  Take a warm bath to relieve any burning or pain from frequent diarrhea episodes. SEEK IMMEDIATE MEDICAL CARE IF:   You are unable to keep fluids down.  You have persistent vomiting.  You have blood in your stool, or your stools are black and tarry.  You do not urinate in 6-8 hours, or there is only a small amount of very dark urine.  You have abdominal pain that increases or localizes.  You have weakness, dizziness, confusion, or light-headedness.  You have a severe headache.  Your diarrhea gets worse or does not get better.  You have a fever or persistent symptoms for more than 2-3 days.  You have a fever and your symptoms suddenly get worse. MAKE SURE YOU:   Understand these instructions.  Will watch your condition.  Will get help right away if you are not doing well or get worse. Document Released: 12/29/2001 Document Revised: 05/25/2013 Document Reviewed: 09/16/2011 [facility] Patient Information 2015 [place], [place]. This information is not intended to replace advice given to you  by your health care provider. Make sure you discuss any questions you have with your health care provider.  Sore Throat A sore throat is pain, burning, irritation, or scratchiness of the throat. There is often pain or tenderness when swallowing or talking. A sore throat  may be accompanied by other symptoms, such as coughing, sneezing, fever, and swollen neck glands. A sore throat is often the first sign of another sickness, such as a cold, flu, strep throat, or mononucleosis (commonly known as mono). Most sore throats go away without medical treatment. CAUSES  The most common causes of a sore throat include:  A viral infection, such as a cold, flu, or mono.  A bacterial infection, such as strep throat, tonsillitis, or whooping cough.  Seasonal allergies.  Dryness in the air.  Irritants, such as smoke or pollution.  Gastroesophageal reflux disease (GERD). HOME CARE INSTRUCTIONS   Only take over-the-counter medicines as directed by your caregiver.  Drink enough fluids to keep your urine clear or pale yellow.  Rest as needed.  Try using throat sprays, lozenges, or sucking on hard candy to ease any pain (if older than 4 years or as directed).  Sip warm liquids, such as broth, herbal tea, or warm water with honey to relieve pain temporarily. You may also eat or drink cold or frozen liquids such as frozen ice pops.  Gargle with salt water (mix 1 tsp salt with 8 oz of water).  Do not smoke and avoid secondhand smoke.  Put a cool-mist humidifier in your bedroom at night to moisten the air. You can also turn on a hot shower and sit in the bathroom with the door closed for 5-10 minutes. SEEK IMMEDIATE MEDICAL CARE IF:  You have difficulty breathing.  You are unable to swallow fluids, soft foods, or your saliva.  You have increased swelling in the throat.  Your sore throat does not get better in 7 days.  You have nausea and vomiting.  You have a fever or persistent symptoms for more than 2-3 days.  You have a fever and your symptoms suddenly get worse. MAKE SURE YOU:   Understand these instructions.  Will watch your condition.  Will get help right away if you are not doing well or get worse. Document Released: 02/16/2004 Document Revised:  12/26/2011 Document Reviewed: 09/16/2011 [facility] Patient Information 2015 [place], [place]. This information is not intended to replace advice given to you by your health care provider. Make sure you discuss any questions you have with your health care provider.  Cough, Adult  A cough is a reflex that helps clear your throat and airways. It can help heal the body or may be a reaction to an irritated airway. A cough may only last 2 or 3 weeks (acute) or may last more than 8 weeks (chronic).  CAUSES Acute cough:  Viral or bacterial infections. Chronic cough:  Infections.  Allergies.  Asthma.  Post-nasal drip.  Smoking.  Heartburn or acid reflux.  Some medicines.  Chronic lung problems (COPD).  Cancer. SYMPTOMS   Cough.  Fever.  Chest pain.  Increased breathing rate.  High-pitched whistling sound when breathing (wheezing).  Colored mucus that you cough up (sputum). TREATMENT   A bacterial cough may be treated with antibiotic medicine.  A viral cough must run its course and will not respond to antibiotics.  Your caregiver may recommend other treatments if you have a chronic cough. HOME CARE INSTRUCTIONS   Only take over-the-counter or prescription medicines for pain,  discomfort, or fever as directed by your caregiver. Use cough suppressants only as directed by your caregiver.  Use a cold steam vaporizer or humidifier in your bedroom or home to help loosen secretions.  Sleep in a semi-upright position if your cough is worse at night.  Rest as needed.  Stop smoking if you smoke. SEEK IMMEDIATE MEDICAL CARE IF:   You have pus in your sputum.  Your cough starts to worsen.  You cannot control your cough with suppressants and are losing sleep.  You begin coughing up blood.  You have difficulty breathing.  You develop pain which is getting worse or is uncontrolled with medicine.  You have a fever. MAKE SURE YOU:   Understand these instructions.  Will  watch your condition.  Will get help right away if you are not doing well or get worse. Document Released: 07/07/2010 Document Revised: 04/02/2011 Document Reviewed: 07/07/2010 [facility] Patient Information 2015 [place], [place]. This information is not intended to replace advice given to you by your health care provider. Make sure you discuss any questions you have with your health care provider.

## 2013-10-08 NOTE — Assessment & Plan Note (Addendum)
Likely describes patient's sx's Will encourage supportive care with chloroseptic spray, warm salt water gargles, cough syrup (Rx for Robittussin DM today), Rx Albuterol inhaler prn (Rx refill today) Attempted to collect stool for culture and C. Diff but pt was unable to provide sample if returns in 1 week will get stool sample  Will check CMET, CBC today Ambulated with pulse ox and O2 was 98-100% Emphasized hand hygiene.  F/u in 1 week, advised to go to ED if worsening while out of town

## 2013-10-08 NOTE — Telephone Encounter (Signed)
Pt calls and states she has sore throat for several days, general body achiness, h/a, appt dr Aundra Dubin [phone] today

## 2013-10-08 NOTE — Progress Notes (Signed)
   Subjective:    Patient ID: Sandra [name], female    DOB: 09/24/1965, 48 y.o.   MRN: [id number]  HPI Comments:  48 y.o h/o asthma (no hospitalization since childhood), DM 2 uncontrolled (HA1C 9.6 today), HTN, HLD, obesity, psoriasis   She presents for:  1) Symptoms noted since 1 week ago starting with diarrhea 2-3 watery bowel movements per day (still having watery bowel movements and had 2 today).  She also noted sore throat on Monday of this week worse with coughing/swallowing but throat is not hurting today.  She also has been running low grade fever last night she checked her temperature and it was 99.5.  She has been experiencing chills as well.  She has tried Robitussin, Chloroseptic spray, Airborne with some relief of her sx's but she is not at baseline.  She also has been having sob with exertion and wheezing (today she ambulates with pulse of of 98-100%).  She states she is felling much better today than Monday.  She also has had myalgias, cough with green sputum initially which started Tuesday and now just a dry cough and a runny nose.  She also has noted a h/a with coughing a lot but she denies h/a today (h/a was noted Tuesday and Wednesday).  She had a recent sick contact with a kid who had pneumonia last week.    Of note-pt is going to out town today until Sunday for a [employer] and she is still going b/c she paid $170 to go  SH: she works are [employer].  She denies smoking.       Review of Systems  Constitutional: Negative for appetite change.  HENT: Positive for rhinorrhea.   Respiratory: Positive for cough and shortness of breath.   Cardiovascular: Negative for chest pain.  Gastrointestinal: Positive for diarrhea. Negative for abdominal pain.  Skin: Negative for rash.       Objective:   Physical Exam  Nursing note and vitals reviewed. Constitutional: She is oriented to person, place, and time. Vital signs are normal. She appears  well-developed and well-nourished. She is cooperative. No distress.  HENT:  Head: Normocephalic and atraumatic.  Mouth/Throat: Oropharynx is clear and moist and mucous membranes are normal. No oropharyngeal exudate or posterior oropharyngeal erythema.  Eyes: Conjunctivae are normal. Pupils are equal, round, and reactive to light. Right eye exhibits no discharge. Left eye exhibits no discharge. No scleral icterus.  Cardiovascular: Normal rate, regular rhythm, S1 normal, S2 normal and normal heart sounds.   No murmur heard. Pulmonary/Chest: Effort normal and breath sounds normal. No respiratory distress. She has no wheezes.  Abdominal: Soft. Bowel sounds are normal. There is no tenderness.  Neurological: She is alert and oriented to person, place, and time. Gait normal.  Skin: Skin is warm, dry and intact. No rash noted. She is not diaphoretic.  Psychiatric: She has a normal mood and affect. Her speech is normal and behavior is normal. Judgment and thought content normal. Cognition and memory are normal.          Assessment & Plan:  F/u in 1 week if not improved

## 2013-10-09 ENCOUNTER — Encounter: Payer: Self-pay | Admitting: Internal Medicine

## 2013-10-09 NOTE — Progress Notes (Signed)
INTERNAL MEDICINE TEACHING ATTENDING ADDENDUM - Keshav Winegar, MD: I reviewed and discussed at the time of visit with the resident Dr. McLean, the patient's medical history, physical examination, diagnosis and results of pertinent tests and treatment and I agree with the patient's care as documented.  

## 2013-10-26 ENCOUNTER — Encounter: Payer: Self-pay | Admitting: *Deleted

## 2013-11-18 ENCOUNTER — Other Ambulatory Visit: Payer: Self-pay | Admitting: Obstetrics & Gynecology

## 2013-11-23 ENCOUNTER — Encounter: Payer: Self-pay | Admitting: Internal Medicine

## 2013-12-15 ENCOUNTER — Other Ambulatory Visit: Payer: Self-pay | Admitting: Internal Medicine

## 2013-12-21 ENCOUNTER — Encounter: Payer: Self-pay | Admitting: Internal Medicine

## 2013-12-21 ENCOUNTER — Ambulatory Visit (INDEPENDENT_AMBULATORY_CARE_PROVIDER_SITE_OTHER): Payer: No Typology Code available for payment source | Admitting: Internal Medicine

## 2013-12-21 VITALS — BP 157/84 | HR 93 | Temp 98.5°F | Ht 60.0 in | Wt 210.9 lb

## 2013-12-21 DIAGNOSIS — B9789 Other viral agents as the cause of diseases classified elsewhere: Principal | ICD-10-CM

## 2013-12-21 DIAGNOSIS — R05 Cough: Secondary | ICD-10-CM

## 2013-12-21 DIAGNOSIS — J069 Acute upper respiratory infection, unspecified: Secondary | ICD-10-CM

## 2013-12-21 MED ORDER — HYDROCODONE-HOMATROPINE 5-1.5 MG/5ML PO SYRP: 5.0000 mL | ORAL_SOLUTION | Freq: Four times a day (QID) | ORAL | Status: DC | PRN

## 2013-12-21 NOTE — Patient Instructions (Addendum)
**You can begin taking the Hycodan cough syrup every 6 hours as needed for cough. There is a narcotic medication in it, so be sure not to take it before driving or performing activities that require you to be alert, as the medication can make you drowsy.   **Use your Albuterol inhaler every 4-6 hours while awake for the next 2-3 days. This should also help open up your airway and reduce your cough  **If you develop a fever or begin to feel worse, please call the clinic to be evaluated.    General Instructions:   Please bring your medicines with you each time you come to clinic.  Medicines may include prescription medications, over-the-counter medications, herbal remedies, eye drops, vitamins, or other pills.   Progress Toward Treatment Goals:  Treatment Goal 04/29/2013  Hemoglobin A1C deteriorated  Blood pressure at goal    Self Care Goals & Plans:  Self Care Goal 12/21/2013  Manage my medications take my medicines as prescribed; refill my medications on time; bring my medications to every visit  Monitor my health keep track of my blood glucose; bring my glucose meter and log to each visit; keep track of my blood pressure  Eat healthy foods eat more vegetables; eat foods that are low in salt; eat baked foods instead of fried foods  Be physically active find an activity I enjoy; take a walk every day  Meeting treatment goals -    Home Blood Glucose Monitoring 04/29/2013  Check my blood sugar once a day  When to check my blood sugar N/A     Care Management & Community Referrals:  Referral 04/29/2013  Referrals made for care management support none needed  Referrals made to community resources none        Homatropine; Hydrocodone oral syrup What is this medicine? HYDROCODONE (hye droe KOE done) is used to help relieve cough. This medicine may be used for other purposes; ask your health care provider or pharmacist if you have questions. COMMON BRAND NAME(S): Hycodan, Hydromet,  Hydropane, Mycodone What should I tell my health care provider before I take this medicine? They need to know if you have any of these conditions: -brain tumor -drug abuse or addiction -head injury -heart disease -if you frequently drink alcohol-containing drinks -kidney disease -liver disease -lung disease, asthma, or breathing problems -mental problems -an allergic reaction to hydrocodone, other opioid analgesics, other medicines, foods, dyes, or preservatives -pregnant or trying to get pregnant -breast-feeding How should I use this medicine? Take this medicine by mouth with a full glass of water. Use a specially marked spoon or dropper to measure your dose. Ask your pharmacist if you do not have one. Do not use a household spoon. Follow the directions on the prescription label. Take your medicine at regular intervals. Do not take your medicine more often than directed. Talk to your pediatrician regarding the use of this medicine in children. This medicine is not approved for use in children less than 50 years old. Patients over 38 years old may have a stronger reaction and need a smaller dose. Overdosage: If you think you have taken too much of this medicine contact a poison control center or emergency room at once. NOTE: This medicine is only for you. Do not share this medicine with others. What if I miss a dose? If you miss a dose, take it as soon as you can. If it is almost time for your next dose, take only that dose. Do not take double  or extra doses. What may interact with this medicine? -alcohol -antihistamines -MAOIs like Carbex, Eldepryl, Marplan, Nardil, and Parnate -medicines for depression, anxiety, or psychotic disturbances -medicines for sleep -muscle relaxants -naltrexone -narcotic medicines (opiates) for pain -tramadol -tricyclic antidepressants like amitriptyline, clomipramine, desipramine, doxepin, imipramine, nortriptyline, and protriptyline This list may not  describe all possible interactions. Give your health care provider a list of all the medicines, herbs, non-prescription drugs, or dietary supplements you use. Also tell them if you smoke, drink alcohol, or use illegal drugs. Some items may interact with your medicine. What should I watch for while using this medicine? You may develop tolerance to this medicine if you take it for a long time. Tolerance means that you will get less cough relief with time. Tell your doctor or health care professional if you symptoms do not improve or if they get worse. Do not suddenly stop taking your medicine because you may develop a severe reaction. Your body becomes used to the medicine. This does NOT mean you are addicted. Addiction is a behavior related to getting and using a drug for a non-medical reason. If your doctor wants you to stop the medicine, the dose will be slowly lowered over time to avoid any side effects. You may get drowsy or dizzy. Do not drive, use machinery, or do anything that needs mental alertness until you know how this medicine affects you. Do not stand or sit up quickly, especially if you are an older patient. This reduces the risk of dizzy or fainting spells. Alcohol may interfere with the effect of this medicine. Avoid alcoholic drinks. This medicine will cause constipation. Try to have a bowel movement at least every 2 to 3 days. If you do not have a bowel movement for 3 days, call your doctor or health care professional. What side effects may I notice from receiving this medicine? Side effects that you should report to your doctor or health care professional as soon as possible: -allergic reactions like skin rash, itching or hives, swelling of the face, lips, or tongue -breathing difficulties, wheezing -confusion -light headedness or fainting spells Side effects that usually do not require medical attention (report to your doctor or health care professional if they continue or are  bothersome): -dizziness -drowsiness -itching -nausea -vomiting This list may not describe all possible side effects. Call your doctor for medical advice about side effects. You may report side effects to FDA at 1-800-FDA-1088. Where should I keep my medicine? Keep out of the reach of children. This medicine can be abused. Keep your medicine in a safe place to protect it from theft. Do not share this medicine with anyone. Selling or giving away this medicine is dangerous and against the law. Store at room temperature between 15 and 30 degrees C (59 and 86 degrees F). Protect from light. Throw away any unused medicine after the expiration date. Discard unused medicine and used packaging carefully. Pets and children can be harmed if they find used or lost packages. NOTE: This sheet is a summary. It may not cover all possible information. If you have questions about this medicine, talk to your doctor, pharmacist, or health care provider.  2015, Elsevier/Gold Standard. (2010-09-18 10:04:07)

## 2013-12-21 NOTE — Progress Notes (Signed)
Patient ID: [name], female   DOB: 1965/02/01, 48 y.o.   MRN: [id number]  Subjective:   Patient ID: KEISHA AMER female   DOB: 08-31-1965 48 y.o.   MRN: [id number]  HPI: Ms.Leilene A Harral is a 48 y.o. F w/ PMH uncontrolled DM2, HTN, HLD, psoriasis, and asthma, who presents for an acute visit for persistent cough.   She states that she has had a cough for 3 weeks and was initially coughing up green/yellow sputum, but now has only a residual dry cough. She denies fevers but has maybe had chills. She has been using [employer] without much benefit and is requesting something else, b/c now her back and sides are hurting from coughing.   She endorses a headache today but not normally. She denies vision changes, chest pain, SOB, abd pain, N/V/D, changes in urination, or difficulty ambulating.  Past Medical History  Diagnosis Date  . Diabetes mellitus type 2, uncontrolled DX: 1999    Started as gestational diabetes  . HSV (herpes simplex virus) anogenital infection   . Migraine     Normal CT head (08/06/2006)  . ESSENTIAL HYPERTENSION 07/08/2006  . HYPERLIPIDEMIA 08/20/2007  . Menorrhagia   . Asthma     as a child  . Carpal tunnel syndrome    Current Outpatient Prescriptions  Medication Sig Dispense Refill  . albuterol (PROVENTIL HFA;VENTOLIN HFA) 108 (90 BASE) MCG/ACT inhaler Inhale 2 puffs into the lungs every 6 (six) hours as needed for wheezing. 1 Inhaler 0  . atorvastatin (LIPITOR) 10 MG tablet TAKE ONE TABLET BY MOUTH ONCE DAILY IN THE EVENING 30 tablet 2  . benzonatate (TESSALON PERLES) 100 MG capsule Take 2 capsules (200 mg total) by mouth 3 (three) times daily as needed for cough. 30 capsule 1  . Chlorphen-PE-Acetaminophen (CORICIDIN D COLD/FLU/SINUS PO) Take 2 tablets by mouth every 6 (six) hours as needed (for cough).    Marland Kitchen dextromethorphan-guaiFENesin (TUSSIN DM) 10-100 MG/5ML liquid Take 5 mLs by mouth every 4 (four) hours as needed for cough. 480 mL 0  .  eletriptan (RELPAX) 20 MG tablet Take 1 tablet (20 mg total) by mouth as needed for migraine (may repeat in 2 hours if necessary). Repeat only once if needed. 18 tablet 2  . glipiZIDE (GLUCOTROL) 10 MG tablet Take 1 tablet (10 mg total) by mouth 2 (two) times daily before a meal. 180 tablet 2  . hydrocortisone 2.5 % lotion Apply to affected area 2 times daily. 60 mL 0  . hydrOXYzine (ATARAX/VISTARIL) 25 MG tablet Take 1 tablet (25 mg total) by mouth 3 (three) times daily as needed for itching. 90 tablet 0  . ibuprofen (ADVIL,MOTRIN) 800 MG tablet Take 1 tablet (800 mg total) by mouth every 8 (eight) hours as needed for pain. 30 tablet 1  . JANUVIA 100 MG tablet TAKE ONE TABLET BY MOUTH ONCE DAILY 90 tablet 0  . medroxyPROGESTERone (PROVERA) 10 MG tablet TAKE 2 TABLETS BY MOUTH EVERY DAY - CAN INCREASE TO TWO TABLETS TWICE A DAY FOR HEAVY BLEEDING 30 tablet 0  . metFORMIN (GLUCOPHAGE) 1000 MG tablet TAKE ONE TABLET BY MOUTH TWICE DAILY WITH A MEAL 60 tablet 2  . methocarbamol (ROBAXIN) 500 MG tablet Take 1 tablet (500 mg total) by mouth 2 (two) times daily. 20 tablet 0  . permethrin (ELIMITE) 1 % lotion Apply to affected area once 60 mL 1  . propranolol (INDERAL) 20 MG tablet TAKE ONE TABLET BY MOUTH TWICE DAILY 60 tablet 3  .  quinapril-hydrochlorothiazide (ACCURETIC) 20-12.5 MG per tablet TAKE ONE TABLET BY MOUTH ONCE DAILY 30 tablet 12  . sodium chloride (OCEAN) 0.65 % SOLN nasal spray Place 1 spray into both nostrils as needed for congestion. 60 mL 0   No current facility-administered medications for this visit.   Family History  Problem Relation Age of Onset  . Hypertension Father   . Diabetes Father    History   Social History  . Marital Status: Married    Spouse Name: N/A    Number of Children: N/A  . Years of Education: N/A   Occupational History  . Daycare teacher    Social History Main Topics  . Smoking status: Never Smoker   . Smokeless tobacco: Never Used  . Alcohol Use:  No  . Drug Use: No  . Sexual Activity: Yes    Birth Control/ Protection: Surgical   Other Topics Concern  . 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   Married.   Review of Systems: A 12 point ROS was performed; pertinent positives and negatives were noted in the HPI   Objective:  Physical Exam: Filed Vitals:   12/21/13 1458  BP: 157/84  Pulse: 93  Temp: 98.5 F (36.9 C)  TempSrc: Oral  Height: 5' (1.524 m)  Weight: 210 lb 14.4 oz (95.664 kg)  SpO2: 100%   Constitutional: Vital signs reviewed.  Patient is a well-developed and well-nourished female in no acute distress and cooperative with exam. Alert and oriented x3.  Head: Normocephalic and atraumatic Eyes: PERRL, EOMI. No scleral icterus.  Neck: Trachea midline  Cardiovascular: RRR, no MRG Pulmonary/Chest: Normal respiratory effort, CTAB, no wheezes, rales, or rhonchi Abdominal: Soft. Non-tender, non-distended Musculoskeletal: No joint deformities Neurological: A&O x3, cranial nerve II-XII are grossly intact, no focal motor deficit.  Skin: Warm, dry and intact. Psychiatric: Appears to feel ill. Normal affect. Speech and behavior is normal.   Assessment & Plan:   Please refer to Problem List based Assessment and Plan

## 2013-12-21 NOTE — Assessment & Plan Note (Signed)
Pt with likely viral URI that has resolved, but she is left with a residual cough in the setting of her asthma. She denies fevers but maybe has had some chills. Cough is now nonproductive. She endorses some intermittent wheezing for which she uses her rescue inhaler. She denies any SOB, and on exam, her lungs are clear. I do not feel that this is an asthma exacerbation or PNA, but more likely sequale from the URI. Will provide a narcotic cough suppressant which should help with the right side and back pain, likely from coughing.  - Hycodan cough syrup, 16mL q6h PRN cough - Albuterol inhaler 2 puffs q6 hours for the next 2-3 days, then PRN use - She is to call the clinic if she develops a fever or if her symptoms worsen

## 2013-12-21 NOTE — Progress Notes (Signed)
Internal Medicine Clinic Attending  Case discussed with Dr. Glenn soon after the resident saw the patient.  We reviewed the resident's history and exam and pertinent patient test results.  I agree with the assessment, diagnosis, and plan of care documented in the resident's note. 

## 2013-12-29 ENCOUNTER — Telehealth: Payer: Self-pay | Admitting: Internal Medicine

## 2013-12-29 NOTE — Telephone Encounter (Signed)
Notes from Pharmacy, pt has not been refilling her Atorvastatin 10mg  and Quinapril/HCTZ 20-12.5mg  daily. To be addresed when pt presents for follow up visit for Chronic medical conditions.  Sandra [name].

## 2014-01-28 ENCOUNTER — Other Ambulatory Visit: Payer: Self-pay | Admitting: Internal Medicine

## 2014-03-02 ENCOUNTER — Encounter: Payer: Self-pay | Admitting: Internal Medicine

## 2014-03-02 ENCOUNTER — Ambulatory Visit (INDEPENDENT_AMBULATORY_CARE_PROVIDER_SITE_OTHER): Payer: No Typology Code available for payment source | Admitting: Internal Medicine

## 2014-03-02 VITALS — BP 141/78 | HR 89 | Temp 98.7°F | Ht 65.0 in | Wt 211.5 lb

## 2014-03-02 DIAGNOSIS — M545 Low back pain, unspecified: Secondary | ICD-10-CM

## 2014-03-02 DIAGNOSIS — E1165 Type 2 diabetes mellitus with hyperglycemia: Secondary | ICD-10-CM

## 2014-03-02 DIAGNOSIS — I10 Essential (primary) hypertension: Secondary | ICD-10-CM

## 2014-03-02 DIAGNOSIS — I447 Left bundle-branch block, unspecified: Secondary | ICD-10-CM

## 2014-03-02 DIAGNOSIS — R1900 Intra-abdominal and pelvic swelling, mass and lump, unspecified site: Secondary | ICD-10-CM

## 2014-03-02 DIAGNOSIS — IMO0002 Reserved for concepts with insufficient information to code with codable children: Secondary | ICD-10-CM

## 2014-03-02 DIAGNOSIS — Z0000 Encounter for general adult medical examination without abnormal findings: Secondary | ICD-10-CM

## 2014-03-02 HISTORY — DX: Intra-abdominal and pelvic swelling, mass and lump, unspecified site: R19.00

## 2014-03-02 HISTORY — DX: Low back pain, unspecified: M54.50

## 2014-03-02 MED ORDER — IBUPROFEN 600 MG PO TABS: 600.0000 mg | ORAL_TABLET | Freq: Four times a day (QID) | ORAL | Status: DC | PRN

## 2014-03-02 MED ORDER — CYCLOBENZAPRINE HCL 5 MG PO TABS: 5.0000 mg | ORAL_TABLET | Freq: Three times a day (TID) | ORAL | Status: DC | PRN

## 2014-03-02 NOTE — Addendum Note (Signed)
Addended by: Bethena Roys on: 03/02/2014 11:10 PM   Modules accepted: Level of Service

## 2014-03-02 NOTE — Assessment & Plan Note (Addendum)
Myoview- 02/17/2013- There is no evidence of ischemia. There is anterior attenuation that appears to be due to a large breast shadow could not rule out an anterior MI.  The LV Ejection Fraction is 31% and a previous assessment of EF by echo revealed an EF of 50-55% .There is severe hypokinesis of the anterior wall with global hypokinesis in the other segments.  Pt saw her cardiologist and plan at that time was for a cradiac MRI. Pt not has had this done, as she did not follow up. Pt denies any symptoms of chest pain, or ACS, but she is DM.   Plan- To follow up with cardiology within the month.

## 2014-03-02 NOTE — Assessment & Plan Note (Signed)
Genital lesions- no flare in years.

## 2014-03-02 NOTE — Assessment & Plan Note (Signed)
Most likely Paravertebral muscle spasm/strain. Pt does not have pain now. No alarm symptoms. Ibuprofen helps but quickly wanes.   Plan- No imaging for now. - Ibuprofen- 600mg  Q6H as needed. 30 tablets, no refills. - flexeril- 5mg  TID, #30, no refills. - Refer to physical therapy if persistent pain.

## 2014-03-02 NOTE — Assessment & Plan Note (Signed)
BP Readings from Last 3 Encounters:  03/02/14 141/78  12/21/13 157/84  10/08/13 136/79    Lab Results  Component Value Date   NA 138 10/08/2013   K 4.1 10/08/2013   CREATININE 0.80 10/08/2013    Assessment: Blood pressure control:  Controlled Progress toward BP goal:   At goal Comments: Complaint with meds- Quinapril-HCTZ- 20-12.5, also on propanolol- 20mg  BID.  Plan: Medications:  continue current medications Educational resources provided:   Self management tools provided:   Other plans:

## 2014-03-02 NOTE — Progress Notes (Signed)
Patient ID: [name], female   DOB: 1965/02/28, 49 y.o.   MRN: [id number]   Subjective:   Patient ID: MCKENNAH KRETCHMER female   DOB: 1965/02/16 49 y.o.   MRN: [id number]  HPI: Ms.Julliette A Bonser is a 49 y.o. with PMH listed below. Presented today with c/o o f back pain- intermittent for 2 months. Describes it as a sharp pain, from center to bilat back. No lower extremity weakness, radiating or shooting pains down her legs, no urinary or fecal incontinence, no fever or weightloss. Pain sometimes wakes her up at night, depending on her position. No trauma, no heavy lifting. She works with children.  She initailly attributed pin to menstraul cramps until she was having pain when she was not on her period.  Pt has been taking overt the counter Ibuprofen- 2 tablets Q4-6 hrs, this helps for a while but then returns.  Pt also says she is having a cough, no associated SOB, not productive, no fever, intermittent, since  Last visit in November for similar problems- managed for viral URTI. Never smoked, little exposure to second hand smoke. Patient also complaints of swelling on the side of her trunk, present for years, mildly tender.  She thinks it is getting a little bit bigger.   Past Medical History  Diagnosis Date  . Diabetes mellitus type 2, uncontrolled DX: 1999    Started as gestational diabetes  . HSV (herpes simplex virus) anogenital infection   . Migraine     Normal CT head (08/06/2006)  . ESSENTIAL HYPERTENSION 07/08/2006  . HYPERLIPIDEMIA 08/20/2007  . Menorrhagia   . Asthma     as a child  . Carpal tunnel syndrome    Current Outpatient Prescriptions  Medication Sig Dispense Refill  . albuterol (PROVENTIL HFA;VENTOLIN HFA) 108 (90 BASE) MCG/ACT inhaler Inhale 2 puffs into the lungs every 6 (six) hours as needed for wheezing. 1 Inhaler 0  . atorvastatin (LIPITOR) 10 MG tablet TAKE ONE TABLET BY MOUTH ONCE DAILY IN THE EVENING 30 tablet 2  . Chlorphen-PE-Acetaminophen (CORICIDIN D  COLD/FLU/SINUS PO) Take 2 tablets by mouth every 6 (six) hours as needed (for cough).    Marland Kitchen dextromethorphan-guaiFENesin (TUSSIN DM) 10-100 MG/5ML liquid Take 5 mLs by mouth every 4 (four) hours as needed for cough. 480 mL 0  . eletriptan (RELPAX) 20 MG tablet Take 1 tablet (20 mg total) by mouth as needed for migraine (may repeat in 2 hours if necessary). Repeat only once if needed. 18 tablet 2  . glipiZIDE (GLUCOTROL) 10 MG tablet Take 1 tablet (10 mg total) by mouth 2 (two) times daily before a meal. 180 tablet 2  . HYDROcodone-homatropine (HYCODAN) 5-1.5 MG/5ML syrup Take 5 mLs by mouth every 6 (six) hours as needed for cough. 120 mL 0  . hydrocortisone 2.5 % lotion Apply to affected area 2 times daily. 60 mL 0  . hydrOXYzine (ATARAX/VISTARIL) 25 MG tablet Take 1 tablet (25 mg total) by mouth 3 (three) times daily as needed for itching. 90 tablet 0  . ibuprofen (ADVIL,MOTRIN) 800 MG tablet Take 1 tablet (800 mg total) by mouth every 8 (eight) hours as needed for pain. 30 tablet 1  . JANUVIA 100 MG tablet TAKE ONE TABLET BY MOUTH ONCE DAILY 90 tablet 0  . medroxyPROGESTERone (PROVERA) 10 MG tablet TAKE 2 TABLETS BY MOUTH EVERY DAY - CAN INCREASE TO TWO TABLETS TWICE A DAY FOR HEAVY BLEEDING 30 tablet 0  . metFORMIN (GLUCOPHAGE) 1000 MG tablet TAKE ONE  TABLET BY MOUTH TWICE DAILY WITH A MEAL 60 tablet 2  . methocarbamol (ROBAXIN) 500 MG tablet Take 1 tablet (500 mg total) by mouth 2 (two) times daily. 20 tablet 0  . permethrin (ELIMITE) 1 % lotion Apply to affected area once 60 mL 1  . propranolol (INDERAL) 20 MG tablet TAKE ONE TABLET BY MOUTH TWICE DAILY 60 tablet 0  . quinapril-hydrochlorothiazide (ACCURETIC) 20-12.5 MG per tablet TAKE ONE TABLET BY MOUTH ONCE DAILY 30 tablet 12  . sodium chloride (OCEAN) 0.65 % SOLN nasal spray Place 1 spray into both nostrils as needed for congestion. 60 mL 0   No current facility-administered medications for this visit.   Family History  Problem Relation  Age of Onset  . Hypertension Father   . Diabetes Father    History   Social History  . Marital Status: Married    Spouse Name: N/A    Number of Children: N/A  . Years of Education: N/A   Occupational History  . Daycare teacher    Social History Main Topics  . Smoking status: Never Smoker   . Smokeless tobacco: Never Used  . Alcohol Use: No  . Drug Use: No  . Sexual Activity: Yes    Birth Control/ Protection: Surgical   Other Topics Concern  . 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   Married.   Review of Systems: CONSTITUTIONAL- No Fever, no weightloss SKIN- Has prior rash diagnosed as Psoriasis by dermatologist, no recent flares, non today. HEAD- Occasionally has mild Headache, has improved, hardly having migraines anymore. RESPIRATORY- Cough present CARDIAC- No  chest pain. URINARY- No Frequency or dysuria.  Objective:  Physical Exam: Filed Vitals:   03/02/14 1325  BP: 141/78  Pulse: 89  Temp: 98.7 F (37.1 C)  TempSrc: Oral  Height: 5\' 5"  (1.651 m)  Weight: 211 lb 8 oz (95.936 kg)  SpO2: 100%   GENERAL- alert, co-operative, appears as stated age, not in any distress. HEENT- Atraumatic, normocephalic, EOMI, oral mucosa appears moist, neck supple. CARDIAC- RRR, no murmurs, rubs or gallops. RESP- Moving equal volumes of air, and clear to auscultation bilaterally, no wheezes or crackles. ABDOMEN- Soft, obese, nontender, no palpable masses or organomegaly, bowel sounds present, has a ~8 by ~8 cm swelling on right side of trunk/lower thoracic rib area, soft to firm in consistency, previously not tender till i examined it, and then mildly tender with palpaption, not mobile, normal skin colour  BACK- Normal curvature of the spine, No tenderness along the vertebrae- pt say she has no pain now even without palpation, no CVA tenderness. NEURO- No obvious Cr N abnormality, strenght upper and lower extremities intact, Gait-  Normal. EXTREMITIES- pulse 2+, symmetric, no pedal edema. SKIN- Warm, dry, No rash or lesion. PSYCH- Normal mood and affect, appropriate thought content and speech.  Assessment & Plan:  The patient's case and plan of care was discussed with attending physician, Dr. Dareen Piano.  Please see problem based charting for assessment and plan.

## 2014-03-02 NOTE — Assessment & Plan Note (Signed)
Complaint with meds- Lipitor- 10mg  daily. Last lipid panel- LDL- 76, total- 130. Will not check lipid panel today.

## 2014-03-02 NOTE — Assessment & Plan Note (Signed)
On right side of patients chest wall, present for several years. Unchanged. 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  Patient cautioned about alarm symptoms- weightloss, pain, nightsweats, anorexia, to let us know, also change in characteristics of mass.  No imaging today. Will Consider Ultrasound if more concern about mass later. So far it appears benign.

## 2014-03-02 NOTE — Patient Instructions (Signed)
General Instructions:  We will like to see you in 6 weeks, we will like you to get your eye exam done before your next visit. Also you are due for a PAP smear, we will get that done at your next visit. Please remember what we talked about with your HgBA1c not where we want it to be and we recommend that you start Insulin.   Since you have decided not to start insulin then it is very important that you work on loosing weight. This will require lots of dedication and discipline and our diabetes Coordinator can help you with this.  It was nice seeing you today.  Please bring your medicines with you each time you come to clinic.  Medicines may include prescription medications, over-the-counter medications, herbal remedies, eye drops, vitamins, or other pills.

## 2014-03-02 NOTE — Assessment & Plan Note (Signed)
Lab Results  Component Value Date   HGBA1C 9.6 10/08/2013   HGBA1C 8.5 04/29/2013   HGBA1C 7.8 12/03/2012     Assessment: Diabetes control:  Uncontrolled Progress toward A1C goal:   Not at goal.            Comments: Complaint with meds- Januvia 164m daily, metformin- 1009mBID, Glipizide- 1056mID.  Plan: Medications:  continue current medications Home glucose monitoring: Frequency:   Timing:   Instruction/counseling given: discussed the need for weight loss and discussed diet Educational resources provided:   Self management tools provided:   Other plans: Pt refused getting HgBA1c today. She has no reason why, she knows it will be high, she just doesn't want it checked. Spent extra time talking to her about uncontrolled diabetes, and effect on multiple organs. Explained that at this point she needs insulin. She is complaint with 3 medications, and yet her HgBA1c has gradually trended up over the past year. She is not telling me why she does not want to start insulin, and did not answer if it was for fear of needles. Emphasized the need for weightloss and diet, and that this will require a lot of dedication and discipline and most times this is not easy to accomplish without guidiance. Offered to make a referral to DM co-ordinator- DonButch Pennyut she declined, saying that she has met DonButch Penny the past.  Appointment in 6 weeks. Cont to emphasize weightloss, diet and if HGBA1c does not improve, Insulin therapy.

## 2014-03-02 NOTE — Assessment & Plan Note (Signed)
Patient refused PAP smear today, she aggress to get it done next visit. Eye exam- Told pt this could be done here in clinic, she says she will rather have it done where her husband does his, she cannot tell me when this will be done, but told her to try an get it done by her next visit, she agrees.  HIV test today. Declined HgBa1c.

## 2014-03-02 NOTE — Assessment & Plan Note (Signed)
Presently has hardly had any headaches over the past year. She was eletriptan at a time- No longer taking. Presently she takes only propanolol 20mg  BID.

## 2014-03-03 LAB — URINALYSIS, ROUTINE W REFLEX MICROSCOPIC
Glucose, UA: 250 mg/dL — AB
Ketones, ur: NEGATIVE mg/dL
Leukocytes, UA: NEGATIVE
Nitrite: NEGATIVE
Protein, ur: NEGATIVE mg/dL
Specific Gravity, Urine: >1.03 — ABNORMAL HIGH (ref 1.005–1.030)
Urobilinogen, UA: 0.2 mg/dL (ref 0.0–1.0)
pH: 5 (ref 5.0–8.0)

## 2014-03-03 LAB — URINALYSIS, MICROSCOPIC ONLY: Casts: NONE SEEN

## 2014-03-03 LAB — HIV ANTIBODY (ROUTINE TESTING W REFLEX): HIV 1&2 Ab, 4th Generation: NONREACTIVE

## 2014-03-03 LAB — BASIC METABOLIC PANEL
BUN: 13 mg/dL (ref 6–23)
CO2: 25 mEq/L (ref 19–32)
Calcium: 8.9 mg/dL (ref 8.4–10.5)
Chloride: 102 mEq/L (ref 96–112)
Creat: 0.72 mg/dL (ref 0.50–1.10)
Glucose, Bld: 237 mg/dL — ABNORMAL HIGH (ref 70–99)
Potassium: 4.4 mEq/L (ref 3.5–5.3)
Sodium: 136 mEq/L (ref 135–145)

## 2014-03-04 NOTE — Progress Notes (Signed)
Quick Note:  Lab work unremarkable, Large HGB in urine- but patient is on her period now. Patient called about results. ______

## 2014-03-05 NOTE — Progress Notes (Signed)
INTERNAL MEDICINE TEACHING ATTENDING ADDENDUM - Deronte Solis, MD: I reviewed and discussed at the time of visit with the resident Dr. Emokpae, the patient's medical history, physical examination, diagnosis and results of pertinent tests and treatment and I agree with the patient's care as documented.  

## 2014-03-11 ENCOUNTER — Other Ambulatory Visit: Payer: Self-pay | Admitting: Internal Medicine

## 2014-04-12 ENCOUNTER — Telehealth: Payer: Self-pay | Admitting: Internal Medicine

## 2014-04-12 NOTE — Telephone Encounter (Signed)
Call to patient to confirm appointment for 04/13/14 at 1:15 lmtcb

## 2014-04-13 ENCOUNTER — Encounter: Payer: Self-pay | Admitting: Internal Medicine

## 2014-04-13 ENCOUNTER — Ambulatory Visit (INDEPENDENT_AMBULATORY_CARE_PROVIDER_SITE_OTHER): Payer: No Typology Code available for payment source | Admitting: Internal Medicine

## 2014-04-13 VITALS — BP 119/75 | HR 91 | Temp 98.3°F | Ht 65.0 in | Wt 209.8 lb

## 2014-04-13 DIAGNOSIS — E119 Type 2 diabetes mellitus without complications: Secondary | ICD-10-CM | POA: Diagnosis not present

## 2014-04-13 DIAGNOSIS — Z0000 Encounter for general adult medical examination without abnormal findings: Secondary | ICD-10-CM | POA: Diagnosis not present

## 2014-04-13 LAB — GLUCOSE, CAPILLARY: Glucose-Capillary: 168 mg/dL — ABNORMAL HIGH (ref 70–99)

## 2014-04-13 LAB — POCT GLYCOSYLATED HEMOGLOBIN (HGB A1C): Hemoglobin A1C: 9

## 2014-04-13 MED ORDER — GLUCOSE BLOOD VI STRP: ORAL_STRIP | Status: DC

## 2014-04-13 MED ORDER — FREESTYLE LANCETS MISC: Status: DC

## 2014-04-13 MED ORDER — CANAGLIFLOZIN 100 MG PO TABS: 100.0000 mg | ORAL_TABLET | Freq: Every day | ORAL | Status: DC

## 2014-04-13 NOTE — Assessment & Plan Note (Addendum)
Lab Results  Component Value Date   HGBA1C 9.0 04/13/2014   HGBA1C 9.6 10/08/2013   HGBA1C 8.5 04/29/2013     Assessment: Diabetes control:  Not at goal Progress toward A1C goal:   Uncontrolled Comments: Complaint with metformin 1000mg  BID, Januvia- 100mg  daily, Glipizide- 10mg  BID. Has started doing some exercise- weight change of 2 lbs since last visit. Pt has vehemently refused starting insulin. Today she is asking about starting one of the new medications advertised on TV- invocana. She has not been check ing her blood sugars at home.  Plan: Medications: Will continue current meds, and start invocana- at 100mg  daily.  Home glucose monitoring: Daily Frequency:  Once  Timing:  Fasting Instruction/counseling given: reminded to get eye exam, reminded to bring blood glucose meter & log to each visit, reminded to bring medications to each visit, discussed the need for weight loss and discussed diet Educational resources provided:   Self management tools provided:   Other plans: Pt counselled extensively about side effects of medications which include- dizziness, hypotension, UTIs, vaginitis, hypoglycemia, risk for hyperkalemia, risk for DKA- no hx of DKA. Also counselled that if she has unusual reactions or feels different from normal, she should let us know, and stop medication immediately.  Pt strongly counselled that this is a new medication, not a lot is known yet about the medication, but she is adamant about starting aggressive treatment for her DM, without using insulin. Information given to patient about new drug. She has also been counselled that she has to start checking her blood sugars every morning. She has to come to clinic in 2-3 weeks with her glucometer.  She says she already has a glucometer, prescription for lancets and strips sent to pharmacy. If she tolerates this medication, and checks her blood sugars her dose can be increased at her next visit.

## 2014-04-13 NOTE — Patient Instructions (Signed)
We have started you on a medication called Invocana, this medication is also Canaglifloxin. Please take 100mg , which is one tablet once a day everyday. It is very importnat that you check your blood sugars every morning. We have sent in prescription for strips and lancets.   We have attached some information below. Please if you feel abnormal or have reaction let us know, stop the medication.  We will like to se you in 2-3 weeks, with your glucometer.   Canagliflozin oral tablets What is this medicine? CANAGLIFLOZIN (KAN a gli FLOE zin) helps to treat type 2 diabetes. It helps to control blood sugar. Treatment is combined with diet and exercise. This medicine may be used for other purposes; ask your health care provider or pharmacist if you have questions. COMMON BRAND NAME(S): Invokana What should I tell my health care provider before I take this medicine? They need to know if you have any of these conditions: -dehydration -diabetic ketoacidosis -diet low in salt -high cholesterol -high levels of potassium in the blood -history of yeast infection of the penis or vagina -kidney disease -liver disease -low blood pressure -on hemodialysis -type 1 diabetes -uncircumcised female -an unusual or allergic reaction to canagliflozin, other medicines, foods, dyes, or preservatives -pregnant or trying to get pregnant -breast-feeding How should I use this medicine? Take this medicine by mouth with a glass of water. Follow the directions on the prescription label. Take it before the first meal of the day. Take your dose at the same time each day. Do not take more often than directed. Do not stop taking except on your doctor's advice. A special MedGuide will be given to you by the pharmacist with each prescription and refill. Be sure to read this information carefully each time. Talk to your pediatrician regarding the use of this medicine in children. Special care may be needed. Overdosage: If you  think you've taken too much of this medicine contact a poison control center or emergency room at once. Overdosage: If you think you have taken too much of this medicine contact a poison control center or emergency room at once. NOTE: This medicine is only for you. Do not share this medicine with others. What if I miss a dose? If you miss a dose, take it as soon as you can. If it is almost time for your next dose, take only that dose. Do not take double or extra doses. What may interact with this medicine? Do not take this medicine with any of the following medications: -gatifloxacinThis medicine may also interact with the following medications: -alcohol -certain medicines for blood pressure, heart disease -digoxin -diuretics -insulin -nateglinide -phenobarbital -phenytoin -repaglinide -rifampin -ritonavir -sulfonylureas like glimepiride, glipizide, glyburide This list may not describe all possible interactions. Give your health care provider a list of all the medicines, herbs, non-prescription drugs, or dietary supplements you use. Also tell them if you smoke, drink alcohol, or use illegal drugs. Some items may interact with your medicine. What should I watch for while using this medicine? Visit your doctor or health care professional for regular checks on your progress. A test called the HbA1C (A1C) will be monitored. This is a simple blood test. It measures your blood sugar control over the last 2 to 3 months. You will receive this test every 3 to 6 months. Learn how to check your blood sugar. Learn the symptoms of low and high blood sugar and how to manage them. Always carry a quick-source of sugar with you in  case you have symptoms of low blood sugar. Examples include hard sugar candy or glucose tablets. Make sure others know that you can choke if you eat or drink when you develop serious symptoms of low blood sugar, such as seizures or unconsciousness. They must get medical help at  once. Tell your doctor or health care professional if you have high blood sugar. You might need to change the dose of your medicine. If you are sick or exercising more than usual, you might need to change the dose of your medicine. Do not skip meals. Ask your doctor or health care professional if you should avoid alcohol. Many nonprescription cough and cold products contain sugar or alcohol. These can affect blood sugar. Wear a medical ID bracelet or chain, and carry a card that describes your disease and details of your medicine and dosage times. What side effects may I notice from receiving this medicine? Side effects that you should report to your doctor or health care professional as soon as possible: -allergic reactions like skin rash, itching or hives, swelling of the face, lips, or tongue -breathing problems -chest pain -dizziness -fast or irregular heartbeat -feeling faint or lightheaded, falls -fever, chills -muscle weakness -signs and symptoms of low blood sugar such as feeling anxious, confusion, dizziness, increased hunger, unusually weak or tired, sweating, shakiness, cold, irritable, headache, blurred vision, fast heartbeat, loss of consciousness -trouble passing urine or change in the amount of urine -penile discharge, itching, or pain in men -vaginal discharge, itching, or odor in women Side effects that usually do not require medical attention (Report these to your doctor or health care professional if they continue or are bothersome.): -constipation -increased urination -nausea -thirsty This list may not describe all possible side effects. Call your doctor for medical advice about side effects. You may report side effects to FDA at 1-800-FDA-1088. Where should I keep my medicine? Keep out of the reach of children. Store at room temperature between 20 and 25 degrees C (68 and 77 degrees F). Throw away any unused medicine after the expiration date.

## 2014-04-13 NOTE — Progress Notes (Signed)
Patient ID: Sandra [name], female   DOB: [birth year], 49 y.o.   MRN: [id number]   Subjective:   Patient ID: Sandra [name] female   DOB: [birth year] 49 y.o.   MRN: [id number]  HPI: [name] is a 48 y.o. with PMH listed below presented today to follow up on her diabetes and have a PAP smear done.  Past Medical History  Diagnosis Date  . Diabetes mellitus type 2, uncontrolled DX: 1999    Started as gestational diabetes  . HSV (herpes simplex virus) anogenital infection   . Migraine     Normal CT head (08/06/2006)  . ESSENTIAL HYPERTENSION 07/08/2006  . HYPERLIPIDEMIA 08/20/2007  . Menorrhagia   . Asthma     as a child  . Carpal tunnel syndrome   . Swelling of abdominal wall 03/02/2014   Current Outpatient Prescriptions  Medication Sig Dispense Refill  . albuterol (PROVENTIL HFA;VENTOLIN HFA) 108 (90 BASE) MCG/ACT inhaler Inhale 2 puffs into the lungs every 6 (six) hours as needed for wheezing. 1 Inhaler 0  . atorvastatin (LIPITOR) 10 MG tablet TAKE ONE TABLET BY MOUTH ONCE DAILY IN THE EVENING 30 tablet 0  . cyclobenzaprine (FLEXERIL) 5 MG tablet Take 1 tablet (5 mg total) by mouth 3 (three) times daily as needed for muscle spasms. 30 tablet 0  . glipiZIDE (GLUCOTROL) 10 MG tablet Take 1 tablet (10 mg total) by mouth 2 (two) times daily before a meal. 180 tablet 2  . HYDROcodone-homatropine (HYCODAN) 5-1.5 MG/5ML syrup Take 5 mLs by mouth every 6 (six) hours as needed for cough. 120 mL 0  . hydrocortisone 2.5 % lotion Apply to affected area 2 times daily. 60 mL 0  . ibuprofen (ADVIL,MOTRIN) 600 MG tablet Take 1 tablet (600 mg total) by mouth every 6 (six) hours as needed. 30 tablet 0  . JANUVIA 100 MG tablet TAKE ONE TABLET BY MOUTH ONCE DAILY 90 tablet 0  . medroxyPROGESTERone (PROVERA) 10 MG tablet TAKE 2 TABLETS BY MOUTH EVERY DAY - CAN INCREASE TO TWO TABLETS TWICE A DAY FOR HEAVY BLEEDING 30 tablet 0  . metFORMIN (GLUCOPHAGE) 1000 MG tablet TAKE ONE TABLET BY MOUTH  TWICE DAILY WITH A MEAL 60 tablet 2  . permethrin (ELIMITE) 1 % lotion Apply to affected area once 60 mL 1  . propranolol (INDERAL) 20 MG tablet TAKE ONE TABLET BY MOUTH TWICE DAILY. 60 tablet 0  . quinapril-hydrochlorothiazide (ACCURETIC) 20-12.5 MG per tablet TAKE ONE TABLET BY MOUTH ONCE DAILY 30 tablet 12   No current facility-administered medications for this visit.   Family History  Problem Relation Age of Onset  . Hypertension Father   . Diabetes Father    History   Social History  . Marital Status: Married    Spouse Name: N/A  . Number of Children: N/A  . Years of Education: N/A   Occupational History  . Daycare teacher    Social History Main Topics  . Smoking status: Never Smoker   . Smokeless tobacco: Never Used  . Alcohol Use: No  . Drug Use: No  . Sexual Activity: Yes    Birth Control/ Protection: Surgical   Other Topics Concern  . None   Social History Narrative   Studying at [employer] in early childhood education.   Married.   Review of Systems: CONSTITUTIONAL- No Fever,  change in appetite. SKIN- No Rash HEAD- No Headache or dizziness. RESPIRATORY- No Cough or SOB. CARDIAC- No Palpitations, chest pain. GI- No vomiting, diarrhoea,  abd pain. URINARY- No Frequency,dysuria. NEUROLOGIC- No Numbness, burning.  Objective:  Physical Exam: Filed Vitals:   04/13/14 1318  BP: 119/75  Pulse: 91  Temp: 98.3 F (36.8 C)  TempSrc: Oral  Height: 5\' 5"  (1.651 m)  Weight: 209 lb 12.8 oz (95.165 kg)  SpO2: 100%   GENERAL- alert, co-operative, appears as stated age, not in any distress. HEENT- Atraumatic, normocephalic, neck supple. CARDIAC- RRR,  RESP- Moving equal volumes of air, and clear to auscultation bilaterally,  ABDOMEN- Soft, nontender, bowel sounds present. Speculum Exam- Normal vulva skin appearance, no masses or redness, some creamy discharge in vagina, denies itching, no foul smell, on speculum exam, cervix visible, healthy, no masses or lesions,  had to use large speculum to visualize cervix.   BACK- Normal curvature of the spine, No tenderness along the vertebrae, no CVA tenderness. NEURO- No obvious Cr N abnormality, strenght upper and lower extremities- 5/5, Sensation intact- globally, DTRs- Normal, finger to nose test normal bilat, rapid alternating movement- intact, Gait- Normal. EXTREMITIES- pulse 2+, symmetric, no pedal edema. SKIN- Warm, dry, No rash or lesion. PSYCH- Normal mood and affect, appropriate thought content and speech.  Assessment & Plan:   The patient's case and plan of care was discussed with attending physician, Dr. Lynnae January.  Please see problem based charting for assessment and plan.

## 2014-04-13 NOTE — Assessment & Plan Note (Signed)
PAP smear today. She will call and schedule her Eye appointment with her husbands ophthalmologist.

## 2014-04-14 NOTE — Progress Notes (Signed)
Internal Medicine Clinic Attending  Case discussed with Dr. Emokpae soon after the resident saw the patient.  We reviewed the resident's history and exam and pertinent patient test results.  I agree with the assessment, diagnosis, and plan of care documented in the resident's note. 

## 2014-04-15 LAB — CYTOLOGY - PAP

## 2014-04-19 ENCOUNTER — Other Ambulatory Visit: Payer: Self-pay | Admitting: Internal Medicine

## 2014-04-23 ENCOUNTER — Other Ambulatory Visit: Payer: Self-pay | Admitting: Internal Medicine

## 2014-04-27 ENCOUNTER — Encounter: Payer: Self-pay | Admitting: Internal Medicine

## 2014-04-27 ENCOUNTER — Ambulatory Visit (INDEPENDENT_AMBULATORY_CARE_PROVIDER_SITE_OTHER): Payer: No Typology Code available for payment source | Admitting: Internal Medicine

## 2014-04-27 VITALS — BP 130/80 | HR 98 | Temp 97.8°F | Ht 60.0 in | Wt 206.6 lb

## 2014-04-27 DIAGNOSIS — E1165 Type 2 diabetes mellitus with hyperglycemia: Secondary | ICD-10-CM

## 2014-04-27 DIAGNOSIS — I10 Essential (primary) hypertension: Secondary | ICD-10-CM | POA: Diagnosis not present

## 2014-04-27 DIAGNOSIS — Z111 Encounter for screening for respiratory tuberculosis: Secondary | ICD-10-CM

## 2014-04-27 DIAGNOSIS — IMO0002 Reserved for concepts with insufficient information to code with codable children: Secondary | ICD-10-CM

## 2014-04-27 DIAGNOSIS — Z139 Encounter for screening, unspecified: Secondary | ICD-10-CM

## 2014-04-27 MED ORDER — QUINAPRIL-HYDROCHLOROTHIAZIDE 20-12.5 MG PO TABS: 1.0000 | ORAL_TABLET | Freq: Every day | ORAL | Status: DC

## 2014-04-27 MED ORDER — PROPRANOLOL HCL 20 MG PO TABS: 20.0000 mg | ORAL_TABLET | Freq: Two times a day (BID) | ORAL | Status: DC

## 2014-04-27 MED ORDER — CANAGLIFLOZIN 100 MG PO TABS
100.0000 mg | ORAL_TABLET | Freq: Every day | ORAL | Status: DC
Start: 2014-04-27 — End: 2014-12-17

## 2014-04-27 MED ORDER — METFORMIN HCL 1000 MG PO TABS: 1000.0000 mg | ORAL_TABLET | Freq: Two times a day (BID) | ORAL | Status: DC

## 2014-04-27 MED ORDER — GLIPIZIDE 10 MG PO TABS: 10.0000 mg | ORAL_TABLET | Freq: Two times a day (BID) | ORAL | Status: DC

## 2014-04-27 NOTE — Patient Instructions (Addendum)
Your blood sugars are still high. We are going to give this a trial, but if you do not have a good result after 3 months we will have to consider other alternatives, like insulin.  Please having blood sugars less than 70 is dangerous, so if you have this again, then please write done the circumstances around this and let us know- if you ate a full meal, if you skipped any meals, and if you took all your diabetes medications.   Please continue checking your blood sugars every morning and every time you feel your blood sugar is low.  If your blood sugar drops below 70, we will recommend you stop taking the Glipizide.   It was nice seeing you today.  It is important you watch your diet, avoid sweets, do exercise.

## 2014-04-27 NOTE — Assessment & Plan Note (Addendum)
Lab Results  Component Value Date   HGBA1C 9.0 04/13/2014   HGBA1C 9.6 10/08/2013   HGBA1C 8.5 04/29/2013     Assessment: Diabetes control:   Uncontrolled Progress toward A1C goal:   Not at goal Comments: Visit today just to discuss how she is tolerating new addition to meds- Invocana. Now on 4 medications of diabetes, as patient has adamantly refused to start insulin. Rather she reseached other medications and said she would prefer to add another PO medication, despite counselling that this has not been showed to be effective. She want to give this a trial first. She wrote down the readings over the past 2 weeks, but did not bring the glucometer. One reading of 52, several readings in the 100s range, 4 reading above 200. Pt did not write down the circumstances surrounding her low blood sugar, she cannot rememeber what meds she took, if she ate or missed a meal. 2 readings in the 300s, she does endorse eating lots of sweets.  - She has tolerated the medication well without side effects.   Plan: Medications:  Will continue present medications Home glucose monitoring: Frequency:   Daily Timing:  Fasting Instruction/counseling given: reminded to get eye exam, reminded to bring blood glucose meter & log to each visit, discussed the need for weight loss and discussed diet Educational resources provided:   Self management tools provided:   Other plans: Patient cautioned about any reading under 70, and that she should let us know, and we will discontinue glipizide.  - She declined having a talk with Butch Penny again today, she says she is not just motivated to do that, and what ever she is told she will not do it anyway. - See in 3 months, told patient if her HGBA1c does not significantly improve, we will be left with only Insulin for treatment, at that point, she says we will address that then. - Cont to check blood sugars every morning and when low. - PPD today, as a job requirement, she will come  back in 3 days to have it read.

## 2014-04-27 NOTE — Progress Notes (Signed)
Patient ID: Sandra [name], female   DOB: 04-Jul-1965, 49 y.o.   MRN: [id number]     Subjective:   Patient ID: Sandra [name] female   DOB: May 28, 1965 49 y.o.   MRN: [id number]  HPI: [name] is a 49 y.o. with PMH listed below. Presented today for follow up on her diabetes.  Past Medical History  Diagnosis Date  . Diabetes mellitus type 2, uncontrolled DX: 1999    Started as gestational diabetes  . HSV (herpes simplex virus) anogenital infection   . Migraine     Normal CT head (08/06/2006)  . ESSENTIAL HYPERTENSION 07/08/2006  . HYPERLIPIDEMIA 08/20/2007  . Menorrhagia   . Asthma     as a child  . Carpal tunnel syndrome   . Swelling of abdominal wall 03/02/2014   Current Outpatient Prescriptions  Medication Sig Dispense Refill  . albuterol (PROVENTIL HFA;VENTOLIN HFA) 108 (90 BASE) MCG/ACT inhaler Inhale 2 puffs into the lungs every 6 (six) hours as needed for wheezing. 1 Inhaler 0  . atorvastatin (LIPITOR) 10 MG tablet TAKE ONE TABLET BY MOUTH IN THE EVENING 30 tablet 6  . canagliflozin (INVOKANA) 100 MG TABS tablet Take 1 tablet (100 mg total) by mouth daily. 30 tablet 0  . cyclobenzaprine (FLEXERIL) 5 MG tablet Take 1 tablet (5 mg total) by mouth 3 (three) times daily as needed for muscle spasms. 30 tablet 0  . glipiZIDE (GLUCOTROL) 10 MG tablet Take 1 tablet (10 mg total) by mouth 2 (two) times daily before a meal. 180 tablet 2  . glucose blood (FREESTYLE TEST STRIPS) test strip Use as instructed 100 each 12  . HYDROcodone-homatropine (HYCODAN) 5-1.5 MG/5ML syrup Take 5 mLs by mouth every 6 (six) hours as needed for cough. 120 mL 0  . hydrocortisone 2.5 % lotion Apply to affected area 2 times daily. 60 mL 0  . ibuprofen (ADVIL,MOTRIN) 600 MG tablet Take 1 tablet (600 mg total) by mouth every 6 (six) hours as needed. 30 tablet 0  . JANUVIA 100 MG tablet TAKE ONE TABLET BY MOUTH ONCE DAILY 90 tablet 0  . Lancets (FREESTYLE) lancets Use as instructed 100 each 12  .  medroxyPROGESTERone (PROVERA) 10 MG tablet TAKE 2 TABLETS BY MOUTH EVERY DAY - CAN INCREASE TO TWO TABLETS TWICE A DAY FOR HEAVY BLEEDING 30 tablet 0  . metFORMIN (GLUCOPHAGE) 1000 MG tablet TAKE ONE TABLET BY MOUTH TWICE DAILY WITH MEALS 60 tablet 1  . permethrin (ELIMITE) 1 % lotion Apply to affected area once 60 mL 1  . propranolol (INDERAL) 20 MG tablet TAKE ONE TABLET BY MOUTH TWICE DAILY 60 tablet 1  . quinapril-hydrochlorothiazide (ACCURETIC) 20-12.5 MG per tablet TAKE ONE TABLET BY MOUTH ONCE DAILY 30 tablet 12   No current facility-administered medications for this visit.   Family History  Problem Relation Age of Onset  . Hypertension Father   . Diabetes Father    History   Social History  . Marital Status: Married    Spouse Name: N/A  . Number of Children: N/A  . Years of Education: N/A   Occupational History  . Daycare teacher    Social History Main Topics  . Smoking status: Never Smoker   . Smokeless tobacco: Never Used  . Alcohol Use: No  . Drug Use: No  . Sexual Activity: Yes    Birth Control/ Protection: Surgical   Other Topics Concern  . Not on file   Social History Narrative   Studying at [employer]  in early childhood education.   Married.   Review of Systems: CONSTITUTIONAL- No Fever, weightloss, night sweat or change in appetite. SKIN- No Rash, colour changes or itching. HEAD- No Headache or dizziness. RESPIRATORY- No Cough or SOB. CARDIAC- No Palpitations, DOE, PND or chest pain. URINARY- No Frequency, urgency, straining or dysuria. NEUROLOGIC- No Numbness, syncope, seizures or burning. [facility] Denies depression or anxiety.  Objective:  Physical Exam: Filed Vitals:   04/27/14 1600  BP: 130/80  Pulse: 98  Temp: 97.8 F (36.6 C)  TempSrc: Oral  Height: 5' (1.524 m)  Weight: 206 lb 9.6 oz (93.713 kg)  SpO2: 99%   GENERAL- alert, co-operative, appears as stated age, not in any distress. HEENT- Atraumatic, normocephalic. CARDIAC- RRR, no  murmurs, rubs or gallops. RESP- Moving equal volumes of air, no wheezes or crackles. ABDOMEN- Soft, nontender,bowel sounds present. NEURO- No obvious Cr N abnormality,SKIN- Warm, dry, No rash or lesion. PSYCH- Normal mood and affect, appropriate thought content and speech.  Assessment & Plan:   The patient's case and plan of care was discussed with attending physician, Dr. Daryll Drown.  Please see problem based charting for assessment and plan.

## 2014-04-28 NOTE — Progress Notes (Signed)
Internal Medicine Clinic Attending  Case discussed with Dr. Emokpae soon after the resident saw the patient.  We reviewed the resident's history and exam and pertinent patient test results.  I agree with the assessment, diagnosis, and plan of care documented in the resident's note. 

## 2014-04-29 LAB — TB SKIN TEST
Induration: 0 mm
TB Skin Test: NEGATIVE

## 2014-05-05 ENCOUNTER — Telehealth: Payer: Self-pay | Admitting: *Deleted

## 2014-05-05 NOTE — Telephone Encounter (Signed)
Pt called - has been a week since having diarrhea every time she eats - more than three times a day. CBG are sl better - about 150. Also has nausea and dizzy.  Talked with pt - has name on waiting list for an appt this week. If diarrhea gets worse - suggest to go to ER. Hilda Blades Ikesha Siller RN 05/05/14 3:30PM

## 2014-05-06 NOTE — Telephone Encounter (Signed)
Thanks, agree with plan. Please let patient know she should stop the metformin for now, and stay hydrated, with over the counter Oral rehydration solution if she can get it, till she is seen in clinic. Also dizziness could be from dehydration.  Thanks.  Ejiro.

## 2014-05-06 NOTE — Telephone Encounter (Signed)
Left message on home phone ID recording - stop metformin for now and stay hydrated per Dr Denton Brick. Pt has appt in clinic 05/07/14 in PM.

## 2014-05-07 ENCOUNTER — Ambulatory Visit (INDEPENDENT_AMBULATORY_CARE_PROVIDER_SITE_OTHER): Payer: No Typology Code available for payment source | Admitting: Internal Medicine

## 2014-05-07 ENCOUNTER — Encounter: Payer: Self-pay | Admitting: Internal Medicine

## 2014-05-07 VITALS — BP 121/63 | HR 86 | Temp 98.4°F | Ht 60.0 in | Wt 206.7 lb

## 2014-05-07 DIAGNOSIS — R197 Diarrhea, unspecified: Secondary | ICD-10-CM | POA: Diagnosis not present

## 2014-05-07 DIAGNOSIS — E113399 Type 2 diabetes mellitus with moderate nonproliferative diabetic retinopathy without macular edema, unspecified eye: Secondary | ICD-10-CM

## 2014-05-07 DIAGNOSIS — E11339 Type 2 diabetes mellitus with moderate nonproliferative diabetic retinopathy without macular edema: Secondary | ICD-10-CM

## 2014-05-07 LAB — GLUCOSE, CAPILLARY: Glucose-Capillary: 155 mg/dL — ABNORMAL HIGH (ref 70–99)

## 2014-05-07 MED ORDER — METFORMIN HCL ER 500 MG PO TB24: 500.0000 mg | ORAL_TABLET | Freq: Every day | ORAL | Status: DC

## 2014-05-07 NOTE — Assessment & Plan Note (Addendum)
Acute diarrhea x 10 days.  Non-bloody, no obvious mucous (but she says she does not look that closely).  Diarrhea occuring only after meals (about 3-4 times per day).  She does not related it to food type.  She had one episode of vomiting in the first few days but none recently.  She denies fever, decreased appetite or abdominal pain.  She is able to drink and stay hydrated.  She has not tried anti-diarrheal.  This has happened about 2 other times before and normally resolves after about five days.  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  No one else in her home is sick.  She is having fewer than 10 BMs per day, she has not been on abx recently, been admitted recently or visited SNF making c.diff unlikely.  She is HIV negative.  She was recently started on Invokana but this is more likely to cause constipation.  She denies hx of IBS, IBD, Celiac's, gastric surgery.  She has taken metformin for years and does not think it is related to this med.  When she called [facility] she was advise to stop metformin to see if it helped (the message was sent yesterday so she just stopped it today).  She says she still had diarrhea today.  She is afebrile, hemodynamically stable, non-toxic appearing and able to keep herself hydrated so this can be managed at home.  I doubt acute diarrheal illness since the timing only after meals is not c/w infection.  This may be med related (metformin). - switch metformin to metformin XR which usually has fewer GI ADRs.  I have advised the patient to start by taking 500mg  daily the first week and then increasing by 500mg  each week until she reaches 2000mg  daily; she was advised to back down to the previous dose if she had ADRs with higher dose.   - patient advised to practice frequent handwashing - advised to come back to clinic in 1 week if no improvement or worsening; would consider stool testing at that time for infection, malabsorption. -  advised to go to ED if she has severe vomiting or cannot stay hydrated

## 2014-05-07 NOTE — Patient Instructions (Addendum)
1. I will call you if there are problems with your blood work.  This may be viral or related to medication.  Please keep hydrated.  If you are still having this problem in 1 week, please come back to clinic.  We may need to test your stool at that time.  If start to vomit or cannot keep hydrated, go to the ER.     2. Please take all medications as prescribed.  I have changed your metformin.  Please pick up metformin XR from pharmacy.  Take 500mg  with breakfast for 1 week, then increase to 1000mg  with breakfast on week 2, and keep increasing every week to 2000mg  with breakfast.  3. If you have worsening of your symptoms or new symptoms arise, please call the clinic ([phone]), or go to the ER immediately if symptoms are severe.

## 2014-05-07 NOTE — Progress Notes (Signed)
   Subjective:    Patient ID: Sandra [name], female    DOB: [birth year], 49 y.o.   MRN: [id number]  HPI Comments: Mr. Hagey is a 49 year old female with PMH as below here with acute c/o diarrhea x 10 days.  Please see problem based charting for A&P.  Diarrhea  This is a recurrent problem. The current episode started 1 to 4 weeks ago (started 10 days ago.). The problem occurs 2 to 4 times per day. The problem has been unchanged. Diarrhea characteristics: started out watery now loose.  No blood or mucous.  The patient states that diarrhea does not awaken her from sleep. Pertinent negatives include no abdominal pain, bloating, fever, headaches, increased  flatus, myalgias or vomiting. Exacerbated by: always occurs after meals.  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 no recent ax use, no recent hospitalization, no new food exposure, no travel. Treatments tried: ginger ale helps.  There is no history of bowel resection, inflammatory bowel disease, irritable bowel syndrome, malabsorption or a recent abdominal surgery.     Past Medical History  Diagnosis Date  . Diabetes mellitus type 2, uncontrolled DX: 1999    Started as gestational diabetes  . HSV (herpes simplex virus) anogenital infection   . Migraine     Normal CT head (08/06/2006)  . ESSENTIAL HYPERTENSION 07/08/2006  . HYPERLIPIDEMIA 08/20/2007  . Menorrhagia   . Asthma     as a child  . Carpal tunnel syndrome   . Swelling of abdominal wall 03/02/2014    Review of Systems  Constitutional: Negative for fever and appetite change.  Gastrointestinal: Positive for diarrhea. Negative for vomiting, abdominal pain, bloating and flatus.  Musculoskeletal: Negative for myalgias.  Neurological: Positive for light-headedness. Negative for syncope and headaches.       Occasionally with position change (she has noticed this since starting Invokana).       Filed Vitals:   05/07/14 1453  BP:  121/63  Pulse: 86  Temp: 98.4 F (36.9 C)  TempSrc: Oral  Height: 5' (1.524 m)  Weight: 206 lb 11.2 oz (93.759 kg)  SpO2: 100%    Objective:   Physical Exam  Constitutional: She is oriented to person, place, and time. She appears well-developed. No distress.  HENT:  Head: Normocephalic and atraumatic.  Mouth/Throat: Oropharynx is clear and moist. No oropharyngeal exudate.  Eyes: EOM are normal. Pupils are equal, round, and reactive to light.  Neck: Neck supple.  Cardiovascular: Normal rate, regular rhythm and normal heart sounds.  Exam reveals no gallop and no friction rub.   No murmur heard. Pulmonary/Chest: Effort normal and breath sounds normal. No respiratory distress. She has no wheezes. She has no rales.  Abdominal: Soft. Bowel sounds are normal. She exhibits no distension and no mass. There is no tenderness. There is no rebound and no guarding.  Musculoskeletal: Normal range of motion. She exhibits no edema or tenderness.  Neurological: She is alert and oriented to person, place, and time. No cranial nerve deficit.  Skin: Skin is warm. She is not diaphoretic.  Psychiatric: She has a normal mood and affect. Her behavior is normal. Judgment and thought content normal.  Vitals reviewed.         Assessment & Plan:  Please see problem based charting for A&P.

## 2014-05-08 LAB — CBC WITH DIFFERENTIAL/PLATELET
Basophils Absolute: 0 10*3/uL (ref 0.0–0.1)
Basophils Relative: 0 % (ref 0–1)
Eosinophils Absolute: 0.1 10*3/uL (ref 0.0–0.7)
Eosinophils Relative: 1 % (ref 0–5)
HCT: 35.9 % — ABNORMAL LOW (ref 36.0–46.0)
Hemoglobin: 11.9 g/dL — ABNORMAL LOW (ref 12.0–15.0)
Lymphocytes Relative: 38 % (ref 12–46)
Lymphs Abs: 3.3 10*3/uL (ref 0.7–4.0)
MCH: 31.8 pg (ref 26.0–34.0)
MCHC: 33.1 g/dL (ref 30.0–36.0)
MCV: 96 fL (ref 78.0–100.0)
MPV: 9.2 fL (ref 8.6–12.4)
Monocytes Absolute: 0.7 10*3/uL (ref 0.1–1.0)
Monocytes Relative: 8 % (ref 3–12)
Neutro Abs: 4.7 10*3/uL (ref 1.7–7.7)
Neutrophils Relative %: 53 % (ref 43–77)
Platelets: 356 10*3/uL (ref 150–400)
RBC: 3.74 MIL/uL — ABNORMAL LOW (ref 3.87–5.11)
RDW: 13.4 % (ref 11.5–15.5)
WBC: 8.8 10*3/uL (ref 4.0–10.5)

## 2014-05-08 LAB — COMPLETE METABOLIC PANEL WITH GFR
ALT: 11 U/L (ref 0–35)
AST: 13 U/L (ref 0–37)
Albumin: 4.1 g/dL (ref 3.5–5.2)
Alkaline Phosphatase: 77 U/L (ref 39–117)
BUN: 14 mg/dL (ref 6–23)
CO2: 24 mEq/L (ref 19–32)
Calcium: 9.5 mg/dL (ref 8.4–10.5)
Chloride: 102 mEq/L (ref 96–112)
Creat: 0.66 mg/dL (ref 0.50–1.10)
GFR, Est African American: >89 mL/min
GFR, Est Non African American: >89 mL/min
Glucose, Bld: 120 mg/dL — ABNORMAL HIGH (ref 70–99)
Potassium: 3.8 mEq/L (ref 3.5–5.3)
Sodium: 136 mEq/L (ref 135–145)
Total Bilirubin: 0.3 mg/dL (ref 0.2–1.2)
Total Protein: 6.8 g/dL (ref 6.0–8.3)

## 2014-05-10 NOTE — Progress Notes (Signed)
Medicine attending: Medical history, presenting problems, physical findings, and medications, reviewed with Dr Alex Wilson and I concur with  her evaluation and management plan. 

## 2014-07-07 ENCOUNTER — Other Ambulatory Visit: Payer: Self-pay | Admitting: Internal Medicine

## 2014-08-02 ENCOUNTER — Emergency Department (HOSPITAL_COMMUNITY)
Admission: EM | Admit: 2014-08-02 | Discharge: 2014-08-02 | Disposition: A | Discharge status: Home / Self Care | Payer: No Typology Code available for payment source | Attending: Emergency Medicine | Admitting: Emergency Medicine

## 2014-08-02 ENCOUNTER — Encounter (HOSPITAL_COMMUNITY): Payer: Self-pay

## 2014-08-02 DIAGNOSIS — R112 Nausea with vomiting, unspecified: Secondary | ICD-10-CM | POA: Insufficient documentation

## 2014-08-02 DIAGNOSIS — Z8742 Personal history of other diseases of the female genital tract: Secondary | ICD-10-CM | POA: Diagnosis not present

## 2014-08-02 DIAGNOSIS — Z8619 Personal history of other infectious and parasitic diseases: Secondary | ICD-10-CM | POA: Insufficient documentation

## 2014-08-02 DIAGNOSIS — Z79899 Other long term (current) drug therapy: Secondary | ICD-10-CM | POA: Insufficient documentation

## 2014-08-02 DIAGNOSIS — I159 Secondary hypertension, unspecified: Secondary | ICD-10-CM

## 2014-08-02 DIAGNOSIS — J45909 Unspecified asthma, uncomplicated: Secondary | ICD-10-CM | POA: Diagnosis not present

## 2014-08-02 DIAGNOSIS — E119 Type 2 diabetes mellitus without complications: Secondary | ICD-10-CM | POA: Diagnosis not present

## 2014-08-02 DIAGNOSIS — E785 Hyperlipidemia, unspecified: Secondary | ICD-10-CM | POA: Insufficient documentation

## 2014-08-02 DIAGNOSIS — R51 Headache: Secondary | ICD-10-CM | POA: Diagnosis present

## 2014-08-02 DIAGNOSIS — Z8669 Personal history of other diseases of the nervous system and sense organs: Secondary | ICD-10-CM | POA: Diagnosis not present

## 2014-08-02 MED ORDER — METOCLOPRAMIDE HCL 10 MG PO TABS
10.0000 mg | ORAL_TABLET | Freq: Once | ORAL | Status: AC
  Administered 2014-08-02: 10 mg via ORAL
  Filled 2014-08-02: qty 1

## 2014-08-02 MED ORDER — IBUPROFEN 800 MG PO TABS
800.0000 mg | ORAL_TABLET | Freq: Once | ORAL | Status: AC
  Administered 2014-08-02: 800 mg via ORAL

## 2014-08-02 NOTE — ED Provider Notes (Signed)
CSN: [id number]     Arrival date & time 08/02/14  6644 History   This chart was scribed for Everlene Balls, MD by Forrestine Him, ED Scribe. This patient was seen in room B19C/B19C and the patient's care was started 1:58 AM.    Chief Complaint  Patient presents with  . Headache  . Hypertension   The history is provided by the patient. No language interpreter was used.    HPI Comments: Sandra [name] is a 49 y.o. female with a PMHx of HTN, DM, and hyperlipidemia who presents to the Emergency Department complaining of constant, ongoing, HA x 1 day. Pt states pain has mildy improved over course of the day. She also reports ongoing nausea and vomiting. OTC Ibuprofen attempted prior to arrival without any improvement for symptoms. Last dose at 9:00 PM this evening. No recent fever, chills, chest pain, shortness of breath, or abdominal pain. Sandra [name] states she has close follow up with her PCP. Lipitor, Metformin, and Januvia taken daily for history of HTN and DM. [place]wever, pt states blood pressure still hovers at 034 systolic. No known allergies to medications.  Past Medical History  Diagnosis Date  . Diabetes mellitus type 2, uncontrolled DX: 1999    Started as gestational diabetes  . HSV (herpes simplex virus) anogenital infection   . Migraine     Normal CT head (08/06/2006)  . ESSENTIAL HYPERTENSION 07/08/2006  . HYPERLIPIDEMIA 08/20/2007  . Menorrhagia   . Asthma     as a child  . Carpal tunnel syndrome   . Swelling of abdominal wall 03/02/2014   Past Surgical History  Procedure Laterality Date  . Cholecystectomy  2009  . Tubal ligation     Family History  Problem Relation Age of Onset  . Hypertension Father   . Diabetes Father    History  Substance Use Topics  . Smoking status: Never Smoker   . Smokeless tobacco: Never Used  . Alcohol Use: No   OB History    Gravida Para Term Preterm AB TAB SAB Ectopic Multiple Living   2 1 1  1 1    1      Review of Systems   Constitutional: Negative for fever and chills.  Respiratory: Negative for cough and shortness of breath.   Cardiovascular: Negative for chest pain and leg swelling.  Gastrointestinal: Positive for nausea and vomiting. Negative for abdominal pain.  Neurological: Positive for headaches. Negative for dizziness, weakness and numbness.  Psychiatric/Behavioral: Negative for confusion.  All other systems reviewed and are negative.     Allergies  Review of patient's allergies indicates no known allergies.  Home Medications   Prior to Admission medications   Medication Sig Start Date End Date Taking? Authorizing Provider  albuterol (PROVENTIL HFA;VENTOLIN HFA) 108 (90 BASE) MCG/ACT inhaler Inhale 2 puffs into the lungs every 6 (six) hours as needed for wheezing. Patient not taking: Reported on 05/07/2014 10/08/13   Cresenciano Genre, MD  atorvastatin (LIPITOR) 10 MG tablet TAKE ONE TABLET BY MOUTH IN THE EVENING 04/26/14   Ejiroghene Arlyce Dice, MD  canagliflozin (INVOKANA) 100 MG TABS tablet Take 1 tablet (100 mg total) by mouth daily. 04/27/14   Ejiroghene Arlyce Dice, MD  cyclobenzaprine (FLEXERIL) 5 MG tablet Take 1 tablet (5 mg total) by mouth 3 (three) times daily as needed for muscle spasms. 03/02/14   Ejiroghene Arlyce Dice, MD  glipiZIDE (GLUCOTROL) 10 MG tablet Take 1 tablet (10 mg total) by mouth 2 (two) times daily before  a meal. 04/27/14 06/06/15  Ejiroghene E Emokpae, MD  glucose blood (FREESTYLE TEST STRIPS) test strip Use as instructed 04/13/14   Ejiroghene E Denton Brick, MD  ibuprofen (ADVIL,MOTRIN) 600 MG tablet Take 1 tablet (600 mg total) by mouth every 6 (six) hours as needed. 03/02/14   Ejiroghene Arlyce Dice, MD  JANUVIA 100 MG tablet TAKE ONE TABLET BY MOUTH ONCE DAILY 07/07/14   Ejiroghene Arlyce Dice, MD  Lancets (FREESTYLE) lancets Use as instructed 04/13/14   Ejiroghene E Emokpae, MD  medroxyPROGESTERone (PROVERA) 10 MG tablet TAKE 2 TABLETS BY MOUTH EVERY DAY - CAN INCREASE TO TWO TABLETS TWICE A  DAY FOR HEAVY BLEEDING Patient not taking: Reported on 05/07/2014 11/20/13   Osborne Oman, MD  metFORMIN (GLUCOPHAGE XR) 500 MG 24 hr tablet Take 1 tablet (500 mg total) by mouth daily with breakfast. 05/07/14 05/07/15  Francesca Oman, DO  propranolol (INDERAL) 20 MG tablet Take 1 tablet (20 mg total) by mouth 2 (two) times daily. 04/27/14   Ejiroghene Arlyce Dice, MD  quinapril-hydrochlorothiazide (ACCURETIC) 20-12.5 MG per tablet Take 1 tablet by mouth daily. 04/27/14   Ejiroghene Arlyce Dice, MD   Triage Vitals: BP 160/87 mmHg  Pulse 93  Temp(Src) 98.2 F (36.8 C) (Oral)  Resp 18  Ht 5' (1.524 m)  Wt 210 lb (95.255 kg)  BMI 41.01 kg/m2  SpO2 96%   Physical Exam  Constitutional: She is oriented to person, place, and time. She appears well-developed and well-nourished. No distress.  HENT:  Head: Normocephalic and atraumatic.  Nose: Nose normal.  Mouth/Throat: Oropharynx is clear and moist. No oropharyngeal exudate.  Eyes: Conjunctivae and EOM are normal. Pupils are equal, round, and reactive to light. No scleral icterus.  Neck: Normal range of motion. Neck supple. No JVD present. No tracheal deviation present. No thyromegaly present.  Cardiovascular: Normal rate, regular rhythm and normal heart sounds.  Exam reveals no gallop and no friction rub.   No murmur heard. Pulmonary/Chest: Effort normal and breath sounds normal. No respiratory distress. She has no wheezes. She exhibits no tenderness.  Abdominal: Soft. Bowel sounds are normal. She exhibits no distension and no mass. There is no tenderness. There is no rebound and no guarding.  Musculoskeletal: Normal range of motion. She exhibits no edema or tenderness.  Lymphadenopathy:    She has no cervical adenopathy.  Neurological: She is alert and oriented to person, place, and time. No cranial nerve deficit. She exhibits normal muscle tone.  Normal strength and sensation in all 4 extremities, normal cerebellar testing.  Skin: Skin is warm and  dry. No rash noted. No erythema. No pallor.  Nursing note and vitals reviewed.   ED Course  Procedures (including critical care time)  DIAGNOSTIC STUDIES: Oxygen Saturation is 96% on RA, adequate by my interpretation.    COORDINATION OF CARE: 2:00 AM-Discussed treatment plan with pt at bedside and pt agreed to plan.     Labs Review Labs Reviewed - No data to display  Imaging Review No results found.   EKG Interpretation None      MDM   Final diagnoses:  None   Patient presents to the ED for hypertension and headache.  She states her DBP was in the 110s.  She took an extra dose for her HTN medication.  Currently her BP is back to her normal limits/  She denied any CP, SOB, diaphoresis or vomiting at the time.  Patient was deferred to PCP for change in HTN medications.  She  was given motrin and reglan for headache. Neurological exam here is normal.  Her appears well and in NAD.  Her VS remain within her normal limits and she is safe for DC.    I personally performed the services described in this documentation, which was scribed in my presence. The recorded information has been reviewed and is accurate.   Everlene Balls, MD 08/02/14 [phone]

## 2014-08-02 NOTE — ED Notes (Signed)
Pt here for headache and htn, sts took migraine meds from otc with no relief. And also reports she hasnt missed her bp medicaiton dose

## 2014-08-02 NOTE — Discharge Instructions (Signed)
Hypertension Ms. Ritter, your blood pressure today was high.  See your primary doctor within 3 days for close follow up.  If any symptoms worsen, come back to the ED immediately. Take motrin 800mg  for headache.  Max dose is 3 times per day.  Thank you. Hypertension is another name for high blood pressure. High blood pressure forces your heart to work harder to pump blood. A blood pressure reading has two numbers, which includes a higher number over a lower number (example: 110/72). HOME CARE   Have your blood pressure rechecked by your doctor.  Only take medicine as told by your doctor. Follow the directions carefully. The medicine does not work as well if you skip doses. Skipping doses also puts you at risk for problems.  Do not smoke.  Monitor your blood pressure at home as told by your doctor. GET HELP IF:  You think you are having a reaction to the medicine you are taking.  You have repeat headaches or feel dizzy.  You have puffiness (swelling) in your ankles.  You have trouble with your vision. GET HELP RIGHT AWAY IF:   You get a very bad headache and are confused.  You feel weak, numb, or faint.  You get chest or belly (abdominal) pain.  You throw up (vomit).  You cannot breathe very well. MAKE SURE YOU:   Understand these instructions.  Will watch your condition.  Will get help right away if you are not doing well or get worse. Document Released: 06/27/2007 Document Revised: 01/13/2013 Document Reviewed: 10/31/2012 [facility] Patient Information 2015 [place], [place]. This information is not intended to replace advice given to you by your health care provider. Make sure you discuss any questions you have with your health care provider.

## 2014-08-23 ENCOUNTER — Encounter (HOSPITAL_COMMUNITY): Payer: Self-pay | Admitting: *Deleted

## 2014-08-23 ENCOUNTER — Emergency Department (HOSPITAL_COMMUNITY): Payer: No Typology Code available for payment source

## 2014-08-23 ENCOUNTER — Emergency Department (HOSPITAL_COMMUNITY)
Admission: EM | Admit: 2014-08-23 | Discharge: 2014-08-23 | Disposition: A | Discharge status: Home / Self Care | Payer: No Typology Code available for payment source | Attending: Emergency Medicine | Admitting: Emergency Medicine

## 2014-08-23 DIAGNOSIS — G43909 Migraine, unspecified, not intractable, without status migrainosus: Secondary | ICD-10-CM | POA: Diagnosis not present

## 2014-08-23 DIAGNOSIS — Z8742 Personal history of other diseases of the female genital tract: Secondary | ICD-10-CM | POA: Diagnosis not present

## 2014-08-23 DIAGNOSIS — I10 Essential (primary) hypertension: Secondary | ICD-10-CM | POA: Diagnosis not present

## 2014-08-23 DIAGNOSIS — S99911A Unspecified injury of right ankle, initial encounter: Secondary | ICD-10-CM | POA: Insufficient documentation

## 2014-08-23 DIAGNOSIS — S8991XA Unspecified injury of right lower leg, initial encounter: Secondary | ICD-10-CM

## 2014-08-23 DIAGNOSIS — Y9389 Activity, other specified: Secondary | ICD-10-CM | POA: Diagnosis not present

## 2014-08-23 DIAGNOSIS — Y9289 Other specified places as the place of occurrence of the external cause: Secondary | ICD-10-CM | POA: Insufficient documentation

## 2014-08-23 DIAGNOSIS — Y998 Other external cause status: Secondary | ICD-10-CM | POA: Diagnosis not present

## 2014-08-23 DIAGNOSIS — Z8619 Personal history of other infectious and parasitic diseases: Secondary | ICD-10-CM | POA: Diagnosis not present

## 2014-08-23 DIAGNOSIS — M1990 Unspecified osteoarthritis, unspecified site: Secondary | ICD-10-CM | POA: Insufficient documentation

## 2014-08-23 DIAGNOSIS — Z79899 Other long term (current) drug therapy: Secondary | ICD-10-CM | POA: Insufficient documentation

## 2014-08-23 DIAGNOSIS — W108XXA Fall (on) (from) other stairs and steps, initial encounter: Secondary | ICD-10-CM | POA: Insufficient documentation

## 2014-08-23 DIAGNOSIS — J45909 Unspecified asthma, uncomplicated: Secondary | ICD-10-CM | POA: Diagnosis not present

## 2014-08-23 DIAGNOSIS — E785 Hyperlipidemia, unspecified: Secondary | ICD-10-CM | POA: Insufficient documentation

## 2014-08-23 DIAGNOSIS — E119 Type 2 diabetes mellitus without complications: Secondary | ICD-10-CM | POA: Diagnosis not present

## 2014-08-23 MED ORDER — NAPROXEN 500 MG PO TABS: 500.0000 mg | ORAL_TABLET | Freq: Two times a day (BID) | ORAL | Status: DC

## 2014-08-23 NOTE — ED Notes (Signed)
Pt fell down concrete steeps ( 2 steeps ) Pt fell on Sunday at 1330 . Pt did not hit head ,no LOC. Pt reports RT knee and Rt knee pain after fall. Pt was ambulatory to room. Pt has used ice pack to sites . Pain 5 /10.

## 2014-08-23 NOTE — Discharge Instructions (Signed)
Knee Sprain A knee sprain is a tear in one of the strong, fibrous tissues that connect the bones (ligaments) in your knee. The severity of the sprain depends on how much of the ligament is torn. The tear can be either partial or complete. CAUSES  Often, sprains are a result of a fall or injury. The force of the impact causes the fibers of your ligament to stretch too much. This excess tension causes the fibers of your ligament to tear. SIGNS AND SYMPTOMS  You may have some loss of motion in your knee. Other symptoms include:  Bruising.  Pain in the knee area.  Tenderness of the knee to the touch.  Swelling. DIAGNOSIS  To diagnose a knee sprain, your health care provider will physically examine your knee. Your health care provider may also suggest an X-ray exam of your knee to make sure no bones are broken. TREATMENT  If your ligament is only partially torn, treatment usually involves keeping the knee in a fixed position (immobilization) or bracing your knee for activities that require movement for several weeks. To do this, your health care provider will apply a bandage, cast, or splint to keep your knee from moving and to support your knee during movement until it heals. For a partially torn ligament, the healing process usually takes 4-6 weeks. If your ligament is completely torn, depending on which ligament it is, you may need surgery to reconnect the ligament to the bone or reconstruct it. After surgery, a cast or splint may be applied and will need to stay on your knee for 4-6 weeks while your ligament heals. HOME CARE INSTRUCTIONS  Keep your injured knee elevated to decrease swelling.  To ease pain and swelling, apply ice to the injured area:  Put ice in a plastic bag.  Place a towel between your skin and the bag.  Leave the ice on for 20 minutes, 2-3 times a day.  Only take medicine for pain as directed by your health care provider.  Do not leave your knee unprotected until  pain and stiffness go away (usually 4-6 weeks).  If you have a cast or splint, do not allow it to get wet. If you have been instructed not to remove it, cover it with a plastic bag when you shower or bathe. Do not swim.  Your health care provider may suggest exercises for you to do during your recovery to prevent or limit permanent weakness and stiffness. SEEK IMMEDIATE MEDICAL CARE IF:  Your cast or splint becomes damaged.  Your pain becomes worse.  You have significant pain, swelling, or numbness below the cast or splint. MAKE SURE YOU:  Understand these instructions.  Will watch your condition.  Will get help right away if you are not doing well or get worse. Document Released: 01/08/2005 Document Revised: 10/29/2012 Document Reviewed: 08/20/2012 [facility] Patient Information 2015 [place], [place]. This information is not intended to replace advice given to you by your health care provider. Make sure you discuss any questions you have with your health care provider.   Ankle Sprain An ankle sprain is an injury to the strong, fibrous tissues (ligaments) that hold the bones of your ankle joint together.  CAUSES An ankle sprain is usually caused by a fall or by twisting your ankle. Ankle sprains most commonly occur when you step on the outer edge of your foot, and your ankle turns inward. People who participate in sports are more prone to these types of injuries.  SYMPTOMS  Pain in your ankle. The pain may be present at rest or only when you are trying to stand or walk.  Swelling.  Bruising. Bruising may develop immediately or within 1 to 2 days after your injury.  Difficulty standing or walking, particularly when turning corners or changing directions. DIAGNOSIS  Your caregiver will ask you details about your injury and perform a physical exam of your ankle to determine if you have an ankle sprain. During the physical exam, your caregiver will press on and apply pressure to  specific areas of your foot and ankle. Your caregiver will try to move your ankle in certain ways. An X-ray exam may be done to be sure a bone was not broken or a ligament did not separate from one of the bones in your ankle (avulsion fracture).  TREATMENT  Certain types of braces can help stabilize your ankle. Your caregiver can make a recommendation for this. Your caregiver may recommend the use of medicine for pain. If your sprain is severe, your caregiver may refer you to a surgeon who helps to restore function to parts of your skeletal system (orthopedist) or a physical therapist. [place] ice to your injury for 1-2 days or as directed by your caregiver. Applying ice helps to reduce inflammation and pain.  Put ice in a plastic bag.  Place a towel between your skin and the bag.  Leave the ice on for 15-20 minutes at a time, every 2 hours while you are awake.  Only take over-the-counter or prescription medicines for pain, discomfort, or fever as directed by your caregiver.  Elevate your injured ankle above the level of your heart as much as possible for 2-3 days.  If your caregiver recommends crutches, use them as instructed. Gradually put weight on the affected ankle. Continue to use crutches or a cane until you can walk without feeling pain in your ankle.  If you have a plaster splint, wear the splint as directed by your caregiver. Do not rest it on anything harder than a pillow for the first 24 hours. Do not put weight on it. Do not get it wet. You may take it off to take a shower or bath.  You may have been given an elastic bandage to wear around your ankle to provide support. If the elastic bandage is too tight (you have numbness or tingling in your foot or your foot becomes cold and blue), adjust the bandage to make it comfortable.  If you have an air splint, you may blow more air into it or let air out to make it more comfortable. You may take your splint off at  night and before taking a shower or bath. Wiggle your toes in the splint several times per day to decrease swelling. SEEK MEDICAL CARE IF:   You have rapidly increasing bruising or swelling.  Your toes feel extremely cold or you lose feeling in your foot.  Your pain is not relieved with medicine. SEEK IMMEDIATE MEDICAL CARE IF:  Your toes are numb or blue.  You have severe pain that is increasing. MAKE SURE YOU:   Understand these instructions.  Will watch your condition.  Will get help right away if you are not doing well or get worse. Document Released: 01/08/2005 Document Revised: 10/03/2011 Document Reviewed: 01/20/2011 [facility] Patient Information 2015 [place], [place]. This information is not intended to replace advice given to you by your health care provider. Make sure you discuss any questions you have  with your health care provider.

## 2014-08-23 NOTE — ED Provider Notes (Signed)
CSN: [id number]     Arrival date & time 08/23/14  4259 History  This chart was scribed for non-physician practitioner Margarita Mail, PA-C working with Orlie Dakin, MD by Zola Button, ED Scribe. This patient was seen in room TR09C/TR09C and the patient's care was started at 10:23 AM.      Chief Complaint  Patient presents with  . Fall   The history is provided by the patient. No language interpreter was used.    HPI Comments: Sandra [name] is a 50 y.o. female with a history of osteoarthritis in her knees who presents to the Emergency Department complaining of sudden onset right knee and right ankle pain secondary to a fall from 2 steps that occurred yesterday around 1:30 PM. She has noticed associated popping sensation as well as swelling to her knee. She applied ice last night. The pain worsened this morning, so she took a hot bath with salt which did help some. Patient has been able to ambulate, but she has had to limp. She states she had fluid drawn from her right knee years ago due to arthritis.   Past Medical History  Diagnosis Date  . Diabetes mellitus type 2, uncontrolled DX: 1999    Started as gestational diabetes  . HSV (herpes simplex virus) anogenital infection   . Migraine     Normal CT head (08/06/2006)  . ESSENTIAL HYPERTENSION 07/08/2006  . HYPERLIPIDEMIA 08/20/2007  . Menorrhagia   . Asthma     as a child  . Carpal tunnel syndrome   . Swelling of abdominal wall 03/02/2014   Past Surgical History  Procedure Laterality Date  . Cholecystectomy  2009  . Tubal ligation     Family History  Problem Relation Age of Onset  . Hypertension Father   . Diabetes Father    History  Substance Use Topics  . Smoking status: Never Smoker   . Smokeless tobacco: Never Used  . Alcohol Use: No   OB History    Gravida Para Term Preterm AB TAB SAB Ectopic Multiple Living   2 1 1  1 1    1      Review of Systems  Constitutional: Negative for fever.  Musculoskeletal: Positive  for joint swelling and arthralgias.  Skin: Negative for rash and wound.      Allergies  Review of patient's allergies indicates no known allergies.  Home Medications   Prior to Admission medications   Medication Sig Start Date End Date Taking? Authorizing Provider  albuterol (PROVENTIL HFA;VENTOLIN HFA) 108 (90 BASE) MCG/ACT inhaler Inhale 2 puffs into the lungs every 6 (six) hours as needed for wheezing. Patient not taking: Reported on 05/07/2014 10/08/13   Cresenciano Genre, MD  atorvastatin (LIPITOR) 10 MG tablet TAKE ONE TABLET BY MOUTH IN THE EVENING 04/26/14   Ejiroghene Arlyce Dice, MD  canagliflozin (INVOKANA) 100 MG TABS tablet Take 1 tablet (100 mg total) by mouth daily. 04/27/14   Ejiroghene Arlyce Dice, MD  cyclobenzaprine (FLEXERIL) 5 MG tablet Take 1 tablet (5 mg total) by mouth 3 (three) times daily as needed for muscle spasms. 03/02/14   Ejiroghene Arlyce Dice, MD  glipiZIDE (GLUCOTROL) 10 MG tablet Take 1 tablet (10 mg total) by mouth 2 (two) times daily before a meal. 04/27/14 06/06/15  Ejiroghene E Emokpae, MD  glucose blood (FREESTYLE TEST STRIPS) test strip Use as instructed 04/13/14   Ejiroghene E Emokpae, MD  ibuprofen (ADVIL,MOTRIN) 600 MG tablet Take 1 tablet (600 mg total) by mouth every  6 (six) hours as needed. 03/02/14   Ejiroghene Arlyce Dice, MD  JANUVIA 100 MG tablet TAKE ONE TABLET BY MOUTH ONCE DAILY 07/07/14   Ejiroghene Arlyce Dice, MD  Lancets (FREESTYLE) lancets Use as instructed 04/13/14   Ejiroghene E Emokpae, MD  medroxyPROGESTERone (PROVERA) 10 MG tablet TAKE 2 TABLETS BY MOUTH EVERY DAY - CAN INCREASE TO TWO TABLETS TWICE A DAY FOR HEAVY BLEEDING Patient not taking: Reported on 05/07/2014 11/20/13   Osborne Oman, MD  metFORMIN (GLUCOPHAGE XR) 500 MG 24 hr tablet Take 1 tablet (500 mg total) by mouth daily with breakfast. 05/07/14 05/07/15  Francesca Oman, DO  naproxen (NAPROSYN) 500 MG tablet Take 1 tablet (500 mg total) by mouth 2 (two) times daily with a meal. 08/23/14    Margarita Mail, PA-C  propranolol (INDERAL) 20 MG tablet Take 1 tablet (20 mg total) by mouth 2 (two) times daily. 04/27/14   Ejiroghene Arlyce Dice, MD  quinapril-hydrochlorothiazide (ACCURETIC) 20-12.5 MG per tablet Take 1 tablet by mouth daily. 04/27/14   Ejiroghene E Emokpae, MD   BP 140/89 mmHg  Pulse 100  Temp(Src) 99.2 F (37.3 C) (Oral)  Resp 20  Ht 5' (1.524 m)  Wt 210 lb (95.255 kg)  BMI 41.01 kg/m2  SpO2 100% Physical Exam  Constitutional: She is oriented to person, place, and time. She appears well-developed and well-nourished. No distress.  HENT:  Head: Normocephalic and atraumatic.  Mouth/Throat: Oropharynx is clear and moist. No oropharyngeal exudate.  Eyes: Pupils are equal, round, and reactive to light.  Neck: Neck supple.  Cardiovascular: Normal rate and intact distal pulses.   Pulmonary/Chest: Effort normal.  Musculoskeletal: She exhibits tenderness.  Pain over lateral malleolus, posterior to lateral malleolus and 5th metatarsal head.  Right knee: Pain with ROM. Palpable pop with flexion. Positive McMurray. Medial knee pain with palpation. No visible swelling. Negative Ballottement test. Negative Lachman's. Negative anterior and posterior drawer. Ligamentously stable.  Neurological: She is alert and oriented to person, place, and time. No cranial nerve deficit.  Skin: Skin is warm and dry. No rash noted.  Psychiatric: She has a normal mood and affect. Her behavior is normal.  Nursing note and vitals reviewed.   ED Course  Procedures  DIAGNOSTIC STUDIES: Oxygen Saturation is 100% on room air, normal by my interpretation.    COORDINATION OF CARE: 10:31 AM-Discussed treatment plan which includes XR imaging with patient/guardian at bedside and patient/guardian agreed to plan.    Labs Review Labs Reviewed - No data to display  Imaging Review Dg Knee 2 Views Right  08/23/2014   CLINICAL DATA:  Fall from steps yesterday, right knee pain  EXAM: RIGHT KNEE - 1-2 VIEW   COMPARISON:  02/20/2007  FINDINGS: Three views of the right knee submitted. There is significant narrowing of medial joint compartment. Spurring of medial tibial plateau and medial femoral condyle. Mild spurring of lateral tibial plateau. Small joint effusion. Narrowing of patellofemoral joint space. Spurring of patella. No acute fracture or subluxation. Posterior spurring of tibial plateau noted on lateral view.  IMPRESSION: No acute fracture or subluxation. Degenerative changes as described above.   Electronically Signed   By: Lahoma Crocker M.D.   On: 08/23/2014 11:00   Dg Ankle Complete Right  08/23/2014   CLINICAL DATA:  Sudden onset of right knee and right ankle pain, fall from steps yesterday  EXAM: RIGHT ANKLE - COMPLETE 3+ VIEW  COMPARISON:  None.  FINDINGS: Three views of the right ankle submitted. No acute  fracture or subluxation. There is plantar and posterior spurring of calcaneus. Ankle mortise is preserved.  IMPRESSION: No acute fracture or subluxation. Plantar and posterior spurring of calcaneus.   Electronically Signed   By: Lahoma Crocker M.D.   On: 08/23/2014 10:59     EKG Interpretation None      MDM   Final diagnoses:  Knee injury, right, initial encounter  Ankle injury, right, initial encounter    Patient X-Ray negative for obvious fracture or dislocation. Pain managed in ED. Pt advised to follow up with orthopedics if symptoms persist for possibility of missed fracture diagnosis. Patient given brace while in ED, conservative therapy recommended and discussed. Patient will be dc home & is agreeable with above plan.   I personally performed the services described in this documentation, which was scribed in my presence. The recorded information has been reviewed and is accurate.      Margarita Mail, PA-C 08/23/14 Winnie, MD 08/23/14 1755

## 2014-09-22 ENCOUNTER — Other Ambulatory Visit: Payer: Self-pay | Admitting: Internal Medicine

## 2014-10-19 ENCOUNTER — Encounter: Payer: No Typology Code available for payment source | Admitting: Internal Medicine

## 2014-11-08 ENCOUNTER — Other Ambulatory Visit: Payer: Self-pay | Admitting: Internal Medicine

## 2014-11-09 LAB — HM DIABETES EYE EXAM

## 2014-11-10 ENCOUNTER — Telehealth: Payer: Self-pay | Admitting: Internal Medicine

## 2014-11-10 ENCOUNTER — Ambulatory Visit (INDEPENDENT_AMBULATORY_CARE_PROVIDER_SITE_OTHER): Payer: No Typology Code available for payment source | Admitting: Internal Medicine

## 2014-11-10 ENCOUNTER — Encounter: Payer: Self-pay | Admitting: Internal Medicine

## 2014-11-10 VITALS — BP 151/80 | HR 94 | Temp 98.3°F | Ht 60.0 in | Wt 214.6 lb

## 2014-11-10 DIAGNOSIS — G43009 Migraine without aura, not intractable, without status migrainosus: Secondary | ICD-10-CM

## 2014-11-10 MED ORDER — PROMETHAZINE HCL 12.5 MG PO TABS: 12.5000 mg | ORAL_TABLET | Freq: Four times a day (QID) | ORAL | Status: DC | PRN

## 2014-11-10 MED ORDER — ELETRIPTAN HYDROBROMIDE 20 MG PO TABS: 20.0000 mg | ORAL_TABLET | ORAL | Status: DC | PRN

## 2014-11-10 NOTE — Telephone Encounter (Signed)
Pt states blood pressure is 179/106, diabetic and having headache for the past couple days. Pt want to know what she should do. Please call back.

## 2014-11-10 NOTE — Progress Notes (Signed)
Subjective:    Patient ID: Sandra [name], female    DOB: 07-06-1965, 49 y.o.   MRN: [id number]  HPI Comments: Sandra [name] is a 49 year old woman with PMH as below w/ acute c/o headache.  Headache  This is a new (Since Monday) problem. The current episode started in the past 7 days. The problem occurs constantly. The pain is located in the frontal region. The pain quality is similar to prior headaches. The quality of the pain is described as pulsating and throbbing. The pain is at a severity of 10/10. The pain is severe. Associated symptoms include nausea, phonophobia and vomiting. Pertinent negatives include no blurred vision, fever, photophobia or weakness. Associated symptoms comments: Vomited twice at work today.. She has tried NSAIDs (200mg  OTC ibuprofen) for the symptoms. The treatment provided mild relief.  She says this headache feels like prior migraine.  She gets migraines about every four months.  They have responded to Relpax in the past but she did not have any more of this rx at home.  She does not know her triggers and cannot think of what precipitated this headache but thinks it may have been related to missing BP meds.  She has been taking propranolol for migraine prophylaxis and did not run out of this med.  She also reports elevated BP 170s/100 today.  She checked her BP because her headache returned.  She ran out of BP medication five days ago but was able to get it (Accuretic) from drugstore yesterday and took last night.   Past Medical History  Diagnosis Date  . Diabetes mellitus type 2, uncontrolled (HCC) DX: 1999    Started as gestational diabetes  . HSV (herpes simplex virus) anogenital infection   . Migraine     Normal CT head (08/06/2006)  . ESSENTIAL HYPERTENSION 07/08/2006  . HYPERLIPIDEMIA 08/20/2007  . Menorrhagia   . Asthma     as a child  . Carpal tunnel syndrome   . Swelling of abdominal wall 03/02/2014   Current Outpatient Prescriptions on File Prior to  Visit  Medication Sig Dispense Refill  . albuterol (PROVENTIL HFA;VENTOLIN HFA) 108 (90 BASE) MCG/ACT inhaler Inhale 2 puffs into the lungs every 6 (six) hours as needed for wheezing. (Patient not taking: Reported on 05/07/2014) 1 Inhaler 0  . atorvastatin (LIPITOR) 10 MG tablet TAKE ONE TABLET BY MOUTH IN THE EVENING 30 tablet 6  . canagliflozin (INVOKANA) 100 MG TABS tablet Take 1 tablet (100 mg total) by mouth daily. 30 tablet 2  . cyclobenzaprine (FLEXERIL) 5 MG tablet Take 1 tablet (5 mg total) by mouth 3 (three) times daily as needed for muscle spasms. 30 tablet 0  . glipiZIDE (GLUCOTROL) 10 MG tablet Take 1 tablet (10 mg total) by mouth 2 (two) times daily before a meal. 180 tablet 2  . glucose blood (FREESTYLE TEST STRIPS) test strip Use as instructed 100 each 12  . ibuprofen (ADVIL,MOTRIN) 600 MG tablet Take 1 tablet (600 mg total) by mouth every 6 (six) hours as needed. 30 tablet 0  . JANUVIA 100 MG tablet TAKE ONE TABLET BY MOUTH ONCE DAILY 30 tablet 0  . Lancets (FREESTYLE) lancets Use as instructed 100 each 12  . medroxyPROGESTERone (PROVERA) 10 MG tablet TAKE 2 TABLETS BY MOUTH EVERY DAY - CAN INCREASE TO TWO TABLETS TWICE A DAY FOR HEAVY BLEEDING (Patient not taking: Reported on 05/07/2014) 30 tablet 0  . metFORMIN (GLUCOPHAGE XR) 500 MG 24 hr tablet Take 1 tablet (  500 mg total) by mouth daily with breakfast. 30 tablet 1  . naproxen (NAPROSYN) 500 MG tablet Take 1 tablet (500 mg total) by mouth 2 (two) times daily with a meal. 30 tablet 0  . propranolol (INDERAL) 20 MG tablet Take 1 tablet (20 mg total) by mouth 2 (two) times daily. 60 tablet 2  . quinapril-hydrochlorothiazide (ACCURETIC) 20-12.5 MG per tablet Take 1 tablet by mouth daily. 30 tablet 6   No current facility-administered medications on file prior to visit.    Review of Systems  Constitutional: Negative for fever.  HENT:       + phonophobia  Eyes: Negative for blurred vision and photophobia.  Gastrointestinal:  Positive for nausea and vomiting.  Neurological: Positive for headaches. Negative for syncope and weakness.       Filed Vitals:   11/10/14 1553  BP: 151/80  Pulse: 94  Temp: 98.3 F (36.8 C)  TempSrc: Oral  Height: 5' (1.524 m)  Weight: 214 lb 9.6 oz (97.342 kg)  SpO2: 98%     Objective:   Physical Exam  Constitutional: She is oriented to person, place, and time. She appears well-developed. No distress.  HENT:  Head: Normocephalic and atraumatic.  Mouth/Throat: Oropharynx is clear and moist. No oropharyngeal exudate.  Eyes: Conjunctivae and EOM are normal. Pupils are equal, round, and reactive to light.  Neck: Neck supple.  Cardiovascular: Normal rate, regular rhythm and normal heart sounds.  Exam reveals no gallop and no friction rub.   No murmur heard. Pulmonary/Chest: Effort normal. No respiratory distress. She has no wheezes. She has no rales.  Abdominal: Soft. Bowel sounds are normal. She exhibits no distension. There is no tenderness. There is no rebound and no guarding.  Musculoskeletal: Normal range of motion.  Neurological: She is alert and oriented to person, place, and time. No cranial nerve deficit. Coordination normal.  Gait is normal.  Skin: Skin is warm. She is not diaphoretic.  Psychiatric: She has a normal mood and affect. Her behavior is normal. Judgment and thought content normal.  Vitals reviewed.         Assessment & Plan:  Please see problem based charting for A&P.

## 2014-11-10 NOTE — Patient Instructions (Signed)
1.  I prescribed Relpax to help with your migraine.  Please you can take an additional one in two hours if your headache persists.  DO NOT TAKE MORE THAN 80mg  (4 tablets) in 24 hours.   2. Please take all medications as prescribed.    3. If you have worsening of your symptoms or new symptoms arise, please call the clinic ([phone]), or go to the ER immediately if symptoms are severe.  Please return to see [name] if symptoms persists despite treatment or if new symptoms such as weakness, vision loss, worsening of headache start.

## 2014-11-10 NOTE — Telephone Encounter (Signed)
Spoke with patient.  She has had headache x3 days.  Taking OTC meds with only short term relief.   Took BP with reading of 176/106.  Reports she has not missed any of her BP meds recently.  Scheduled patient to be seen this afternoon.

## 2014-11-11 NOTE — Assessment & Plan Note (Addendum)
Pounding H/A for two days accompanied by nausea, phonophobia similar to prior migraine c/w migraine.  No neuro abnormalities or symptoms to suggest neuro etiology.  No mental status change, fever, photophobia, neck stiffness to suggest meningitis.  Unclear what triggered this migraine, but less likely related to HTN as BP not significantly elevated.  Relpax has worked for her migraine in the past but she does not have any left. - rx Relpax 20mg  prn (can repeat a second dose if no response; no more than 80mg  in 24 hours) - rx Phenergan 12.5mg  q6h prn for nausea - continue propranolol  - RTC if headache persists or does not respond to above - to ER with severe symptoms

## 2014-11-16 NOTE — Progress Notes (Signed)
Case discussed with Dr. Wilson soon after the resident saw the patient. We reviewed the resident's history and exam and pertinent patient test results. I agree with the assessment, diagnosis, and plan of care documented in the resident's note. 

## 2014-12-15 ENCOUNTER — Emergency Department (HOSPITAL_COMMUNITY)
Admission: EM | Admit: 2014-12-15 | Discharge: 2014-12-15 | Disposition: A | Discharge status: Home / Self Care | Payer: No Typology Code available for payment source | Attending: Emergency Medicine | Admitting: Emergency Medicine

## 2014-12-15 ENCOUNTER — Emergency Department (HOSPITAL_COMMUNITY): Payer: No Typology Code available for payment source

## 2014-12-15 ENCOUNTER — Encounter (HOSPITAL_COMMUNITY): Payer: Self-pay | Admitting: Emergency Medicine

## 2014-12-15 DIAGNOSIS — E119 Type 2 diabetes mellitus without complications: Secondary | ICD-10-CM | POA: Diagnosis not present

## 2014-12-15 DIAGNOSIS — Z79899 Other long term (current) drug therapy: Secondary | ICD-10-CM | POA: Insufficient documentation

## 2014-12-15 DIAGNOSIS — E785 Hyperlipidemia, unspecified: Secondary | ICD-10-CM | POA: Insufficient documentation

## 2014-12-15 DIAGNOSIS — J9801 Acute bronchospasm: Secondary | ICD-10-CM | POA: Diagnosis not present

## 2014-12-15 DIAGNOSIS — I10 Essential (primary) hypertension: Secondary | ICD-10-CM | POA: Diagnosis not present

## 2014-12-15 DIAGNOSIS — Z8619 Personal history of other infectious and parasitic diseases: Secondary | ICD-10-CM | POA: Insufficient documentation

## 2014-12-15 DIAGNOSIS — Z8669 Personal history of other diseases of the nervous system and sense organs: Secondary | ICD-10-CM | POA: Insufficient documentation

## 2014-12-15 DIAGNOSIS — R059 Cough, unspecified: Secondary | ICD-10-CM

## 2014-12-15 DIAGNOSIS — R1110 Vomiting, unspecified: Secondary | ICD-10-CM | POA: Diagnosis present

## 2014-12-15 DIAGNOSIS — G43909 Migraine, unspecified, not intractable, without status migrainosus: Secondary | ICD-10-CM | POA: Diagnosis not present

## 2014-12-15 DIAGNOSIS — R05 Cough: Secondary | ICD-10-CM

## 2014-12-15 MED ORDER — HYDROCODONE-ACETAMINOPHEN 7.5-325 MG/15ML PO SOLN
10.0000 mL | Freq: Once | ORAL | Status: AC
  Administered 2014-12-15: 10 mL via ORAL
  Filled 2014-12-15: qty 15

## 2014-12-15 MED ORDER — HYDROCODONE-ACETAMINOPHEN 7.5-325 MG/15ML PO SOLN: 15.0000 mL | Freq: Four times a day (QID) | ORAL | Status: AC | PRN

## 2014-12-15 MED ORDER — ALBUTEROL SULFATE (2.5 MG/3ML) 0.083% IN NEBU
INHALATION_SOLUTION | RESPIRATORY_TRACT | Status: AC
  Filled 2014-12-15: qty 12

## 2014-12-15 MED ORDER — PREDNISONE 20 MG PO TABS: 20.0000 mg | ORAL_TABLET | Freq: Two times a day (BID) | ORAL | Status: DC

## 2014-12-15 MED ORDER — ONDANSETRON 4 MG PO TBDP
4.0000 mg | ORAL_TABLET | Freq: Once | ORAL | Status: AC
  Administered 2014-12-15: 4 mg via ORAL
  Filled 2014-12-15: qty 1

## 2014-12-15 MED ORDER — AZITHROMYCIN 250 MG PO TABS: ORAL_TABLET | ORAL | Status: DC

## 2014-12-15 MED ORDER — ALBUTEROL (5 MG/ML) CONTINUOUS INHALATION SOLN
10.0000 mg/h | INHALATION_SOLUTION | Freq: Once | RESPIRATORY_TRACT | Status: AC
  Administered 2014-12-15: 10 mg/h via RESPIRATORY_TRACT
  Filled 2014-12-15: qty 20

## 2014-12-15 MED ORDER — PREDNISONE 20 MG PO TABS
60.0000 mg | ORAL_TABLET | Freq: Once | ORAL | Status: AC
  Administered 2014-12-15: 60 mg via ORAL
  Filled 2014-12-15: qty 3

## 2014-12-15 NOTE — ED Provider Notes (Signed)
CSN: [id number]     Arrival date & time 12/15/14  0645 History   First MD Initiated Contact with Patient 12/15/14 [phone]     Chief Complaint  Patient presents with  . Emesis     (Consider location/radiation/quality/duration/timing/severity/associated sxs/prior Treatment) HPI  Past Medical History  Diagnosis Date  . Diabetes mellitus type 2, uncontrolled (HCC) DX: 1999    Started as gestational diabetes  . HSV (herpes simplex virus) anogenital infection   . Migraine     Normal CT head (08/06/2006)  . ESSENTIAL HYPERTENSION 07/08/2006  . HYPERLIPIDEMIA 08/20/2007  . Menorrhagia   . Asthma     as a child  . Carpal tunnel syndrome   . Swelling of abdominal wall 03/02/2014   Past Surgical History  Procedure Laterality Date  . Cholecystectomy  2009  . Tubal ligation     Family History  Problem Relation Age of Onset  . Hypertension Father   . Diabetes Father    Social History  Substance Use Topics  . Smoking status: Never Smoker   . Smokeless tobacco: Never Used  . Alcohol Use: No   OB History    Gravida Para Term Preterm AB TAB SAB Ectopic Multiple Living   2 1 1  1 1    1      Review of Systems    Allergies  Review of patient's allergies indicates no known allergies.  Home Medications   Prior to Admission medications   Medication Sig Start Date End Date Taking? Authorizing Provider  albuterol (PROVENTIL HFA;VENTOLIN HFA) 108 (90 BASE) MCG/ACT inhaler Inhale 2 puffs into the lungs every 6 (six) hours as needed for wheezing. 10/08/13  Yes Nino Glow McLean-Scocozza, MD  atorvastatin (LIPITOR) 10 MG tablet TAKE ONE TABLET BY MOUTH IN THE EVENING 04/26/14  Yes Ejiroghene E Emokpae, MD  canagliflozin (INVOKANA) 100 MG TABS tablet Take 1 tablet (100 mg total) by mouth daily. 04/27/14  Yes Ejiroghene E Emokpae, MD  eletriptan (RELPAX) 20 MG tablet Take 1 tablet (20 mg total) by mouth as needed for migraine or headache. May repeat in 2 hours if headache persists or recurs. 11/10/14   Yes Alex Ronnie Derby, DO  glipiZIDE (GLUCOTROL) 10 MG tablet Take 1 tablet (10 mg total) by mouth 2 (two) times daily before a meal. 04/27/14 06/06/15 Yes Ejiroghene E Emokpae, MD  ibuprofen (ADVIL,MOTRIN) 600 MG tablet Take 1 tablet (600 mg total) by mouth every 6 (six) hours as needed. 03/02/14  Yes Ejiroghene E Emokpae, MD  JANUVIA 100 MG tablet TAKE ONE TABLET BY MOUTH ONCE DAILY 11/09/14  Yes Ejiroghene E Emokpae, MD  levocetirizine (XYZAL) 5 MG tablet Take 5 mg by mouth every evening.   Yes Historical Provider, MD  metFORMIN (GLUCOPHAGE XR) 500 MG 24 hr tablet Take 1 tablet (500 mg total) by mouth daily with breakfast. 05/07/14 05/07/15 Yes Alex Ronnie Derby, DO  propranolol (INDERAL) 20 MG tablet Take 1 tablet (20 mg total) by mouth 2 (two) times daily. 04/27/14  Yes Ejiroghene E Emokpae, MD  quinapril-hydrochlorothiazide (ACCURETIC) 20-12.5 MG per tablet Take 1 tablet by mouth daily. 04/27/14  Yes Ejiroghene Arlyce Dice, MD  azithromycin (ZITHROMAX Z-PAK) 250 MG tablet As written 12/15/14   Tanna Furry, MD  cyclobenzaprine (FLEXERIL) 5 MG tablet Take 1 tablet (5 mg total) by mouth 3 (three) times daily as needed for muscle spasms. Patient not taking: Reported on 12/15/2014 03/02/14   Ejiroghene Arlyce Dice, MD  glucose blood (FREESTYLE TEST STRIPS) test strip Use as  instructed 04/13/14   Ejiroghene Arlyce Dice, MD  HYDROcodone-acetaminophen (HYCET) 7.5-325 mg/15 ml solution Take 15 mLs by mouth 4 (four) times daily as needed for moderate pain. 12/15/14 12/15/15  Tanna Furry, MD  Lancets (FREESTYLE) lancets Use as instructed 04/13/14   Ejiroghene Arlyce Dice, MD  medroxyPROGESTERone (PROVERA) 10 MG tablet TAKE 2 TABLETS BY MOUTH EVERY DAY - CAN INCREASE TO TWO TABLETS TWICE A DAY FOR HEAVY BLEEDING Patient not taking: Reported on 05/07/2014 11/20/13   Osborne Oman, MD  naproxen (NAPROSYN) 500 MG tablet Take 1 tablet (500 mg total) by mouth 2 (two) times daily with a meal. Patient not taking: Reported on 12/15/2014  08/23/14   Margarita Mail, PA-C  predniSONE (DELTASONE) 20 MG tablet Take 1 tablet (20 mg total) by mouth 2 (two) times daily with a meal. 12/15/14   Tanna Furry, MD  promethazine (PHENERGAN) 12.5 MG tablet Take 1 tablet (12.5 mg total) by mouth every 6 (six) hours as needed for nausea or vomiting. Patient not taking: Reported on 12/15/2014 11/10/14   Francesca Oman, DO   BP 136/85 mmHg  Pulse 95  Temp(Src) 98.5 F (36.9 C) (Oral)  Resp 16  SpO2 92%  LMP  Physical Exam  ED Course  Procedures (including critical care time) Labs Review Labs Reviewed - No data to display  Imaging Review Dg Chest 2 View  12/15/2014  CLINICAL DATA:  Cough and chest congestion for 3 weeks. EXAM: CHEST  2 VIEW COMPARISON:  01/11/2013 FINDINGS: The heart size and mediastinal contours are within normal limits. Both lungs are clear. The visualized skeletal structures are unremarkable. IMPRESSION: No active cardiopulmonary disease. Electronically Signed   By: Earle Gell M.D.   On: 12/15/2014 07:55   I have personally reviewed and evaluated these images and lab results as part of my medical decision-making.   EKG Interpretation None      MDM   Final diagnoses:  Bronchospasm  Cough    Patient not febrile or hypoxemic. Initially broken spastic. Symptomatically had great relief after albuterol and on reexam has clear lungs without prolongation. No infiltrate on x-ray. Plan is home, prednisone, high set, Zithromax, albuterol. Was cautioned that she should expect a 5100 point bump in her sugars until completed her steroids.    Tanna Furry, MD 12/15/14 [phone]

## 2014-12-15 NOTE — Discharge Instructions (Signed)
Bronchospasm, Adult A bronchospasm is when the tubes that carry air in and out of your lungs (airways) spasm or tighten. During a bronchospasm it is hard to breathe. This is because the airways get smaller. A bronchospasm can be triggered by:  Allergies. These may be to animals, pollen, food, or mold.  Infection. This is a common cause of bronchospasm.  Exercise.  Irritants. These include pollution, cigarette smoke, strong odors, aerosol sprays, and paint fumes.  Weather changes.  Stress.  Being emotional. HOME CARE   Always have a plan for getting help. Know when to call your doctor and local emergency services (911 in the [place] Know where you can get emergency care.  Only take medicines as told by your doctor.  If you were prescribed an inhaler or nebulizer machine, ask your doctor how to use it correctly. Always use a spacer with your inhaler if you were given one.  Stay calm during an attack. Try to relax and breathe more slowly.  Control your home environment:  Change your heating and air conditioning filter at least once a month.  Limit your use of fireplaces and wood stoves.  Do not  smoke. Do not  allow smoking in your home.  Avoid perfumes and fragrances.  Get rid of pests (such as roaches and mice) and their droppings.  Throw away plants if you see mold on them.  Keep your house clean and dust free.  Replace carpet with wood, tile, or vinyl flooring. Carpet can trap dander and dust.  Use allergy-proof pillows, mattress covers, and box spring covers.  Wash bed sheets and blankets every week in hot water. Dry them in a dryer.  Use blankets that are made of polyester or cotton.  Wash hands frequently. GET HELP IF:  You have muscle aches.  You have chest pain.  The thick spit you spit or cough up (sputum) changes from clear or white to yellow, green, gray, or bloody.  The thick spit you spit or cough up gets thicker.  There are problems that may be  related to the medicine you are given such as:  A rash.  Itching.  Swelling.  Trouble breathing. GET HELP RIGHT AWAY IF:  You feel you cannot breathe or catch your breath.  You cannot stop coughing.  Your treatment is not helping you breathe better.  You have very bad chest pain. MAKE SURE YOU:   Understand these instructions.  Will watch your condition.  Will get help right away if you are not doing well or get worse.   This information is not intended to replace advice given to you by your health care provider. Make sure you discuss any questions you have with your health care provider.   Document Released: 11/05/2008 Document Revised: 01/29/2014 Document Reviewed: 07/01/2012 Elsevier Interactive Patient Education 2016 [employer]  

## 2014-12-15 NOTE — ED Notes (Signed)
Per pt, she has had a cough x 3 weeks, this morning she experienced some yellow mucus in her emesis, and felt as though she was choking. Pt also c/o back pain and shortness of breath on exertion. States she has been taking diabetic robitussin for the cough.

## 2014-12-15 NOTE — ED Notes (Signed)
Off unit with xray. 

## 2014-12-17 ENCOUNTER — Other Ambulatory Visit: Payer: Self-pay | Admitting: Internal Medicine

## 2014-12-20 ENCOUNTER — Encounter: Payer: Self-pay | Admitting: Internal Medicine

## 2014-12-28 ENCOUNTER — Encounter: Payer: Self-pay | Admitting: Internal Medicine

## 2015-01-18 ENCOUNTER — Encounter: Payer: Self-pay | Admitting: Internal Medicine

## 2015-01-18 ENCOUNTER — Ambulatory Visit (INDEPENDENT_AMBULATORY_CARE_PROVIDER_SITE_OTHER): Payer: No Typology Code available for payment source | Admitting: Internal Medicine

## 2015-01-18 VITALS — BP 152/83 | HR 95 | Temp 98.5°F | Ht 60.0 in | Wt 217.6 lb

## 2015-01-18 DIAGNOSIS — E11319 Type 2 diabetes mellitus with unspecified diabetic retinopathy without macular edema: Secondary | ICD-10-CM | POA: Diagnosis not present

## 2015-01-18 DIAGNOSIS — Z7984 Long term (current) use of oral hypoglycemic drugs: Secondary | ICD-10-CM | POA: Diagnosis not present

## 2015-01-18 DIAGNOSIS — E1165 Type 2 diabetes mellitus with hyperglycemia: Secondary | ICD-10-CM

## 2015-01-18 DIAGNOSIS — E1169 Type 2 diabetes mellitus with other specified complication: Secondary | ICD-10-CM

## 2015-01-18 DIAGNOSIS — I10 Essential (primary) hypertension: Secondary | ICD-10-CM

## 2015-01-18 DIAGNOSIS — G43809 Other migraine, not intractable, without status migrainosus: Secondary | ICD-10-CM

## 2015-01-18 DIAGNOSIS — E669 Obesity, unspecified: Principal | ICD-10-CM

## 2015-01-18 LAB — POCT GLYCOSYLATED HEMOGLOBIN (HGB A1C): Hemoglobin A1C: 8.9

## 2015-01-18 LAB — GLUCOSE, CAPILLARY: Glucose-Capillary: 262 mg/dL — ABNORMAL HIGH (ref 65–99)

## 2015-01-18 MED ORDER — METFORMIN HCL ER 500 MG PO TB24: 500.0000 mg | ORAL_TABLET | Freq: Every day | ORAL | Status: DC

## 2015-01-18 MED ORDER — SITAGLIPTIN PHOSPHATE 100 MG PO TABS: 100.0000 mg | ORAL_TABLET | Freq: Every day | ORAL | Status: DC

## 2015-01-18 MED ORDER — PROPRANOLOL HCL 20 MG PO TABS: 20.0000 mg | ORAL_TABLET | Freq: Two times a day (BID) | ORAL | Status: DC

## 2015-01-18 MED ORDER — GLIPIZIDE 10 MG PO TABS: 10.0000 mg | ORAL_TABLET | Freq: Two times a day (BID) | ORAL | Status: DC

## 2015-01-18 NOTE — Assessment & Plan Note (Signed)
BP Readings from Last 3 Encounters:  01/18/15 152/83  12/15/14 132/75  11/10/14 151/80    Lab Results  Component Value Date   NA 136 05/07/2014   K 3.8 05/07/2014   CREATININE 0.66 05/07/2014    Assessment: Blood pressure control:  Uncontrolled Progress toward BP goal:   Not at goal Comments: Pt says she has no taken her meds today. She says her blood pressure is better at home. She will check her Bp and bring log to next visit. - Bmet today. - If BP increased- can double dose of quinapril-HCTZ

## 2015-01-18 NOTE — Progress Notes (Signed)
Patient ID: Sandra [name], female   DOB: [birth year], 49 y.o.   MRN: [id number]   Subjective:   Patient ID: Sandra [name] female   DOB: 12-17-1965 49 y.o.   MRN: [id number]  HPI: [name] is a 49 y.o. with PMH listed below. Presented today for follow up on her chronic medical conditions- DM and HTN- please see problem based charting for details and assessment and plan.  Past Medical History  Diagnosis Date  . Diabetes mellitus type 2, uncontrolled (HCC) DX: 1999    Started as gestational diabetes  . HSV (herpes simplex virus) anogenital infection   . Migraine     Normal CT head (08/06/2006)  . ESSENTIAL HYPERTENSION 07/08/2006  . HYPERLIPIDEMIA 08/20/2007  . Menorrhagia   . Asthma     as a child  . Carpal tunnel syndrome   . Swelling of abdominal wall 03/02/2014   Current Outpatient Prescriptions  Medication Sig Dispense Refill  . albuterol (PROVENTIL HFA;VENTOLIN HFA) 108 (90 BASE) MCG/ACT inhaler Inhale 2 puffs into the lungs every 6 (six) hours as needed for wheezing. 1 Inhaler 0  . atorvastatin (LIPITOR) 10 MG tablet TAKE ONE TABLET BY MOUTH IN THE EVENING 30 tablet 6  . azithromycin (ZITHROMAX Z-PAK) 250 MG tablet As written 6 tablet 0  . cyclobenzaprine (FLEXERIL) 5 MG tablet Take 1 tablet (5 mg total) by mouth 3 (three) times daily as needed for muscle spasms. (Patient not taking: Reported on 12/15/2014) 30 tablet 0  . eletriptan (RELPAX) 20 MG tablet Take 1 tablet (20 mg total) by mouth as needed for migraine or headache. May repeat in 2 hours if headache persists or recurs. 10 tablet 0  . glipiZIDE (GLUCOTROL) 10 MG tablet Take 1 tablet (10 mg total) by mouth 2 (two) times daily before a meal. 180 tablet 2  . glucose blood (FREESTYLE TEST STRIPS) test strip Use as instructed 100 each 12  . HYDROcodone-acetaminophen (HYCET) 7.5-325 mg/15 ml solution Take 15 mLs by mouth 4 (four) times daily as needed for moderate pain. 120 mL 0  . ibuprofen (ADVIL,MOTRIN) 600 MG  tablet Take 1 tablet (600 mg total) by mouth every 6 (six) hours as needed. 30 tablet 0  . INVOKANA 100 MG TABS tablet TAKE ONE TABLET BY MOUTH ONCE DAILY. 30 tablet 0  . JANUVIA 100 MG tablet TAKE ONE TABLET BY MOUTH ONCE DAILY 30 tablet 0  . Lancets (FREESTYLE) lancets Use as instructed 100 each 12  . levocetirizine (XYZAL) 5 MG tablet Take 5 mg by mouth every evening.    . medroxyPROGESTERone (PROVERA) 10 MG tablet TAKE 2 TABLETS BY MOUTH EVERY DAY - CAN INCREASE TO TWO TABLETS TWICE A DAY FOR HEAVY BLEEDING (Patient not taking: Reported on 05/07/2014) 30 tablet 0  . metFORMIN (GLUCOPHAGE XR) 500 MG 24 hr tablet Take 1 tablet (500 mg total) by mouth daily with breakfast. 30 tablet 1  . naproxen (NAPROSYN) 500 MG tablet Take 1 tablet (500 mg total) by mouth 2 (two) times daily with a meal. (Patient not taking: Reported on 12/15/2014) 30 tablet 0  . predniSONE (DELTASONE) 20 MG tablet Take 1 tablet (20 mg total) by mouth 2 (two) times daily with a meal. 10 tablet 0  . promethazine (PHENERGAN) 12.5 MG tablet Take 1 tablet (12.5 mg total) by mouth every 6 (six) hours as needed for nausea or vomiting. (Patient not taking: Reported on 12/15/2014) 20 tablet 0  . propranolol (INDERAL) 20 MG tablet TAKE ONE TABLET BY  MOUTH TWICE DAILY. 60 tablet 0  . quinapril-hydrochlorothiazide (ACCURETIC) 20-12.5 MG per tablet Take 1 tablet by mouth daily. 30 tablet 6   No current facility-administered medications for this visit.   Family History  Problem Relation Age of Onset  . Hypertension Father   . Diabetes Father    Social History   Social History  . Marital Status: Married    Spouse Name: N/A  . Number of Children: N/A  . Years of Education: N/A   Occupational History  . Daycare teacher    Social History Main Topics  . Smoking status: Never Smoker   . Smokeless tobacco: Never Used  . Alcohol Use: No  . Drug Use: No  . Sexual Activity: Yes    Birth Control/ Protection: Surgical   Other Topics  Concern  . None   Social History Narrative   Studying at [employer] in early childhood education.   Married.   Review of Systems: CONSTITUTIONAL- No Fever, weightloss, night sweat or change in appetite. SKIN- No Rash, colour changes or itching. HEAD- No Headache or dizziness. EYES- Had laser surgery a month ago RESPIRATORY- No Cough or SOB. CARDIAC- No Palpitations, or chest pain. GI- No diarrhoea, constipation, abd pain. URINARY- No Frequency, or dysuria. NEUROLOGIC- No Numbness, or burning. [facility]- Denies depression or anxiety.  Objective:  Physical Exam: Filed Vitals:   01/18/15 1437  BP: 152/83  Pulse: 95  Temp: 98.5 F (36.9 C)  TempSrc: Oral  Height: 5' (1.524 m)  Weight: 217 lb 9.6 oz (98.703 kg)  SpO2: 99%   GENERAL- alert, co-operative, appears as stated age, not in any distress. HEENT- Atraumatic, normocephalic, CARDIAC- RRR, no murmurs, rubs or gallops. RESP- clear to auscultation bilaterally, no wheezes or crackles. ABDOMEN- Soft, nontender,  bowel sounds present. NEURO- No obvious Cr N abnormality, Gait- Normal. EXTREMITIES- pulse 2+, symmetric, no pedal edema. SKIN- Warm, dry, No rash or lesion. PSYCH- Normal mood and affect, appropriate thought content and speech.  Assessment & Plan:  The patient's case and plan of care was discussed with attending physician, Dr. Daryll Drown.  Please see problem based charting for assessment and plan.

## 2015-01-18 NOTE — Patient Instructions (Signed)
It was nice seeing you today. Please it is very important to get your diabetes under control. As you do not want to start any injectable medication, then it is important you work on exercise and weightloss. Adding any other medication that you will take by mouth would not help at this point. Prolonged uncontrolled diabetes affects eyes, kidneys, increases risks of heart attacks and strokes, can cause neuropathy with burning in legs, and basically all organs in the body.  Also your blood pressure is slightly high today, we will double the dose of the medication you are taking for your blood pressure- Quinapril-hydrochlorothiazide.     Exercising to Lose Weight Exercising can help you to lose weight. In order to lose weight through exercise, you need to do vigorous-intensity exercise. You can tell that you are exercising with vigorous intensity if you are breathing very hard and fast and cannot hold a conversation while exercising. Moderate-intensity exercise helps to maintain your current weight. You can tell that you are exercising at a moderate level if you have a higher heart rate and faster breathing, but you are still able to hold a conversation. HOW OFTEN SHOULD I EXERCISE? Choose an activity that you enjoy and set realistic goals. Your health care provider can help you to make an activity plan that works for you. Exercise regularly as directed by your health care provider. This may include:  Doing resistance training twice each week, such as:  Push-ups.  Sit-ups.  Lifting weights.  Using resistance bands.  Doing a given intensity of exercise for a given amount of time. Choose from these options:  150 minutes of moderate-intensity exercise every week.  75 minutes of vigorous-intensity exercise every week.  A mix of moderate-intensity and vigorous-intensity exercise every week. Children, pregnant women, people who are out of shape, people who are overweight, and older adults may  need to consult a health care provider for individual recommendations. If you have any sort of medical condition, be sure to consult your health care provider before starting a new exercise program. WHAT ARE SOME ACTIVITIES THAT CAN HELP ME TO LOSE WEIGHT?   Walking at a rate of at least 4.5 miles an hour.  Jogging or running at a rate of 5 miles per hour.  Biking at a rate of at least 10 miles per hour.  Lap swimming.  Roller-skating or in-line skating.  Cross-country skiing.  Vigorous competitive sports, such as football, basketball, and soccer.  Jumping rope.  Aerobic dancing. HOW CAN I BE MORE ACTIVE IN MY DAY-TO-DAY ACTIVITIES?  Use the stairs instead of the elevator.  Take a walk during your lunch break.  If you drive, park your car farther away from work or school.  If you take public transportation, get off one stop early and walk the rest of the way.  Make all of your phone calls while standing up and walking around.  Get up, stretch, and walk around every 30 minutes throughout the day. WHAT GUIDELINES SHOULD I FOLLOW WHILE EXERCISING?  Do not exercise so much that you hurt yourself, feel dizzy, or get very short of breath.  Consult your health care provider prior to starting a new exercise program.  Wear comfortable clothes and shoes with good support.  Drink plenty of water while you exercise to prevent dehydration or heat stroke. Body water is lost during exercise and must be replaced.  Work out until you breathe faster and your heart beats faster.   This information is not intended  to replace advice given to you by your health care provider. Make sure you discuss any questions you have with your health care provider.   Document Released: 02/10/2010 Document Revised: 01/29/2014 Document Reviewed: 06/11/2013 Elsevier Interactive Patient Education [employer].

## 2015-01-18 NOTE — Assessment & Plan Note (Signed)
Pt with uncontrolled diabetes- persistently. HgbA1c- 8.9 today. She is still resistant to starting any form of injectable medication. She stopped invocana about 2 months ago and says she will stay on 3 Oral meds for her diabetes. She is aware it is uncontrolled, and is aware of the long term complication of uncontrolled Dm, but she says she is still not going to start injectables. Also there will be no benefit of adding a 4th medication for blood sugar control. I informed her that she could work on diet and exercise and that her HgbA1c could be normal -below 7 if she is consistent. 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 She says dieting is too hard and has persistently declined my referral to our clinic nutritionist.  Counselled pt extensively today. We are both aware of her uncontrolled diabetes, but today she says she will rather continue what she is doing. And she thanked me for my concern.  Plan- Will continue to encourage weightloss and exercise, as she has refused injectatbles. - Continue to advice pt on the complications of uncontrolled diabetes,as today she might feel okay, but complications could become obvious 5-10 yrs down the road. - Infor on weightloss given to pt. - Foot exam today. - Request for eye exam records.

## 2015-01-19 LAB — BMP8+ANION GAP
Anion Gap: 16 mmol/L (ref 10.0–18.0)
BUN/Creatinine Ratio: 18 (ref 9–23)
BUN: 10 mg/dL (ref 6–24)
CO2: 23 mmol/L (ref 18–29)
Calcium: 8.9 mg/dL (ref 8.7–10.2)
Chloride: 102 mmol/L (ref 96–106)
Creatinine, Ser: 0.56 mg/dL — ABNORMAL LOW (ref 0.57–1.00)
GFR calc Af Amer: 128 mL/min/{1.73_m2} (ref >59)
GFR calc non Af Amer: 111 mL/min/{1.73_m2} (ref >59)
Glucose: 244 mg/dL — ABNORMAL HIGH (ref 65–99)
Potassium: 4 mmol/L (ref 3.5–5.2)
Sodium: 141 mmol/L (ref 134–144)

## 2015-01-19 NOTE — Progress Notes (Signed)
Internal Medicine Clinic Attending  Case discussed with Dr. Emokpae soon after the resident saw the patient.  We reviewed the resident's history and exam and pertinent patient test results.  I agree with the assessment, diagnosis, and plan of care documented in the resident's note. 

## 2015-01-20 ENCOUNTER — Encounter: Payer: Self-pay | Admitting: Pharmacist

## 2015-01-20 NOTE — Progress Notes (Signed)
Patient ID: Sandra [name], female   DOB: [birth year], 49 y.o.   MRN: [id number]

## 2015-02-22 ENCOUNTER — Other Ambulatory Visit: Payer: Self-pay | Admitting: Internal Medicine

## 2015-02-23 ENCOUNTER — Ambulatory Visit (INDEPENDENT_AMBULATORY_CARE_PROVIDER_SITE_OTHER): Payer: No Typology Code available for payment source | Admitting: Internal Medicine

## 2015-02-23 VITALS — BP 149/78 | HR 95 | Temp 98.6°F | Wt 221.1 lb

## 2015-02-23 DIAGNOSIS — E113413 Type 2 diabetes mellitus with severe nonproliferative diabetic retinopathy with macular edema, bilateral: Secondary | ICD-10-CM | POA: Diagnosis not present

## 2015-02-23 DIAGNOSIS — G43009 Migraine without aura, not intractable, without status migrainosus: Secondary | ICD-10-CM

## 2015-02-23 DIAGNOSIS — I10 Essential (primary) hypertension: Secondary | ICD-10-CM | POA: Diagnosis not present

## 2015-02-23 DIAGNOSIS — E1165 Type 2 diabetes mellitus with hyperglycemia: Secondary | ICD-10-CM

## 2015-02-23 DIAGNOSIS — I447 Left bundle-branch block, unspecified: Secondary | ICD-10-CM | POA: Diagnosis not present

## 2015-02-23 DIAGNOSIS — Z0000 Encounter for general adult medical examination without abnormal findings: Secondary | ICD-10-CM

## 2015-02-23 DIAGNOSIS — G43809 Other migraine, not intractable, without status migrainosus: Secondary | ICD-10-CM

## 2015-02-23 MED ORDER — ATORVASTATIN CALCIUM 20 MG PO TABS: 20.0000 mg | ORAL_TABLET | Freq: Every day | ORAL | Status: DC

## 2015-02-23 MED ORDER — ELETRIPTAN HYDROBROMIDE 20 MG PO TABS: 20.0000 mg | ORAL_TABLET | ORAL | Status: DC | PRN

## 2015-02-23 MED ORDER — PROPRANOLOL HCL 20 MG PO TABS: 20.0000 mg | ORAL_TABLET | Freq: Two times a day (BID) | ORAL | Status: DC

## 2015-02-23 MED ORDER — QUINAPRIL-HYDROCHLOROTHIAZIDE 20-12.5 MG PO TABS: 1.0000 | ORAL_TABLET | Freq: Every day | ORAL | Status: DC

## 2015-02-23 NOTE — Patient Instructions (Signed)
Exercising to Lose Weight Exercising can help you to lose weight. In order to lose weight through exercise, you need to do vigorous-intensity exercise. You can tell that you are exercising with vigorous intensity if you are breathing very hard and fast and cannot hold a conversation while exercising. Moderate-intensity exercise helps to maintain your current weight. You can tell that you are exercising at a moderate level if you have a higher heart rate and faster breathing, but you are still able to hold a conversation. HOW OFTEN SHOULD I EXERCISE? Choose an activity that you enjoy and set realistic goals. Your health care provider can help you to make an activity plan that works for you. Exercise regularly as directed by your health care provider. This may include:  Doing resistance training twice each week, such as:  Push-ups.  Sit-ups.  Lifting weights.  Using resistance bands.  Doing a given intensity of exercise for a given amount of time. Choose from these options:  150 minutes of moderate-intensity exercise every week.  75 minutes of vigorous-intensity exercise every week.  A mix of moderate-intensity and vigorous-intensity exercise every week. Children, pregnant women, people who are out of shape, people who are overweight, and older adults may need to consult a health care provider for individual recommendations. If you have any sort of medical condition, be sure to consult your health care provider before starting a new exercise program. WHAT ARE SOME ACTIVITIES THAT CAN HELP ME TO LOSE WEIGHT?   Walking at a rate of at least 4.5 miles an hour.  Jogging or running at a rate of 5 miles per hour.  Biking at a rate of at least 10 miles per hour.  Lap swimming.  Roller-skating or in-line skating.  Cross-country skiing.  Vigorous competitive sports, such as football, basketball, and soccer.  Jumping rope.  Aerobic dancing. HOW CAN I BE MORE ACTIVE IN MY DAY-TO-DAY  ACTIVITIES?  Use the stairs instead of the elevator.  Take a walk during your lunch break.  If you drive, park your car farther away from work or school.  If you take public transportation, get off one stop early and walk the rest of the way.  Make all of your phone calls while standing up and walking around.  Get up, stretch, and walk around every 30 minutes throughout the day. WHAT GUIDELINES SHOULD I FOLLOW WHILE EXERCISING?  Do not exercise so much that you hurt yourself, feel dizzy, or get very short of breath.  Consult your health care provider prior to starting a new exercise program.  Wear comfortable clothes and shoes with good support.  Drink plenty of water while you exercise to prevent dehydration or heat stroke. Body water is lost during exercise and must be replaced.  Work out until you breathe faster and your heart beats faster.   This information is not intended to replace advice given to you by your health care provider. Make sure you discuss any questions you have with your health care provider.

## 2015-02-24 NOTE — Assessment & Plan Note (Signed)
Still with uncontrolled DM. Absoultely refuses to do any injectables. She is aware she has very dfew options- Weightloss and diet or injectables. She already has retinopathy. Counselled pt extensively about uncontrolled DM complications. She voices understanding but still refuses. Recommended meeting Butch Penny, she refused. She admits to eating a lot of sweets. Her weight is increasing.   Plan- Will continue to address issue and emphasize weight loss and diet.

## 2015-02-24 NOTE — Progress Notes (Signed)
Patient ID: Sandra [name], female   DOB: Jun 07, 1965, 50 y.o.   MRN: [id number]   Subjective:   Patient ID: Sandra [name] female   DOB: 1965/05/20 50 y.o.   MRN: [id number]  HPI: [name] is a 50 y.o. with with PMH listed below presented for routine follow up visit, please see problem based charting for assessment and plan on issues addressed on todays visit.  Past Medical History  Diagnosis Date  . Diabetes mellitus type 2, uncontrolled (HCC) DX: 1999    Started as gestational diabetes  . HSV (herpes simplex virus) anogenital infection   . Migraine     Normal CT head (08/06/2006)  . ESSENTIAL HYPERTENSION 07/08/2006  . HYPERLIPIDEMIA 08/20/2007  . Menorrhagia   . Asthma     as a child  . Carpal tunnel syndrome   . Swelling of abdominal wall 03/02/2014   Current Outpatient Prescriptions  Medication Sig Dispense Refill  . albuterol (PROVENTIL HFA;VENTOLIN HFA) 108 (90 BASE) MCG/ACT inhaler Inhale 2 puffs into the lungs every 6 (six) hours as needed for wheezing. 1 Inhaler 0  . atorvastatin (LIPITOR) 20 MG tablet Take 1 tablet (20 mg total) by mouth daily at 6 PM. 30 tablet 6  . azithromycin (ZITHROMAX Z-PAK) 250 MG tablet As written 6 tablet 0  . cyclobenzaprine (FLEXERIL) 5 MG tablet Take 1 tablet (5 mg total) by mouth 3 (three) times daily as needed for muscle spasms. (Patient not taking: Reported on 12/15/2014) 30 tablet 0  . eletriptan (RELPAX) 20 MG tablet Take 1 tablet (20 mg total) by mouth as needed for migraine or headache. May repeat in 2 hours if headache persists or recurs. 10 tablet 0  . glipiZIDE (GLUCOTROL) 10 MG tablet Take 1 tablet (10 mg total) by mouth 2 (two) times daily before a meal. 180 tablet 2  . glucose blood (FREESTYLE TEST STRIPS) test strip Use as instructed 100 each 12  . HYDROcodone-acetaminophen (HYCET) 7.5-325 mg/15 ml solution Take 15 mLs by mouth 4 (four) times daily as needed for moderate pain. 120 mL 0  . Lancets (FREESTYLE) lancets Use  as instructed 100 each 12  . levocetirizine (XYZAL) 5 MG tablet Take 5 mg by mouth every evening.    . medroxyPROGESTERone (PROVERA) 10 MG tablet TAKE 2 TABLETS BY MOUTH EVERY DAY - CAN INCREASE TO TWO TABLETS TWICE A DAY FOR HEAVY BLEEDING (Patient not taking: Reported on 05/07/2014) 30 tablet 0  . metFORMIN (GLUCOPHAGE XR) 500 MG 24 hr tablet Take 1 tablet (500 mg total) by mouth daily with breakfast. 90 tablet 2  . promethazine (PHENERGAN) 12.5 MG tablet Take 1 tablet (12.5 mg total) by mouth every 6 (six) hours as needed for nausea or vomiting. (Patient not taking: Reported on 12/15/2014) 20 tablet 0  . propranolol (INDERAL) 20 MG tablet Take 1 tablet (20 mg total) by mouth 2 (two) times daily. 60 tablet 3  . quinapril-hydrochlorothiazide (ACCURETIC) 20-12.5 MG tablet Take 1 tablet by mouth daily. 30 tablet 6  . sitaGLIPtin (JANUVIA) 100 MG tablet Take 1 tablet (100 mg total) by mouth daily. 90 tablet 2   No current facility-administered medications for this visit.   Family History  Problem Relation Age of Onset  . Hypertension Father   . Diabetes Father    Social History   Social History  . Marital Status: Married    Spouse Name: N/A  . Number of Children: N/A  . Years of Education: N/A   Occupational History  .  Daycare teacher    Social History Main Topics  . Smoking status: Never Smoker   . Smokeless tobacco: Never Used  . Alcohol Use: No  . Drug Use: No  . Sexual Activity: Yes    Birth Control/ Protection: Surgical   Other Topics Concern  . 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   Married.   Review of Systems: CONSTITUTIONAL- No Fever, weightloss. SKIN- No Rash, colour changes or itching. HEAD- Has occasional migraines-,no dizziness. Mouth/throat- No Sorethroat, dentures, or bleeding gums. RESPIRATORY- No Cough or SOB. CARDIAC- No Palpitations, or chest pain. GI- No vomiting, diarrhoea, constipation, abd  pain. URINARY- No Frequency, or dysuria. NEUROLOGIC- No Numbness, syncope, seizures or burning. [facility]- Denies depression or anxiety.  Objective:  Physical Exam: Filed Vitals:   02/23/15 1448  BP: 149/78  Pulse: 95  Temp: 98.6 F (37 C)  TempSrc: Oral  Weight: 221 lb 1.6 oz (100.29 kg)  SpO2: 99%   GENERAL- alert, co-operative, appears as stated age, not in any distress. HEENT- Atraumatic, normocephalic, PERRL, EOMI, oral mucosa appears moist,  neck supple. CARDIAC- RRR, no murmurs, rubs or gallops. RESP- Moving equal volumes of air, and clear to auscultation bilaterally. ABDOMEN- Soft, obese,nontender,  bowel sounds present. BACK- Normal curvature of the spine, no CVA tenderness. NEURO- No obvious Cr N abnormality, strenght upper and lower extremities-intact, Gait- Normal. EXTREMITIES- pulse 2+, symmetric, no pedal edema. SKIN- Warm, dry, No rash or lesion. PSYCH- Normal mood and affect, appropriate thought content and speech. Assessment & Plan:   The patient's case and plan of care was discussed with attending physician, Dr. Dareen Piano.  Please see problem based charting for assessment and plan.

## 2015-02-24 NOTE — Progress Notes (Signed)
Internal Medicine Clinic Attending  Case discussed with Dr. Emokpae soon after the resident saw the patient.  We reviewed the resident's history and exam and pertinent patient test results.  I agree with the assessment, diagnosis, and plan of care documented in the resident's note. 

## 2015-02-24 NOTE — Assessment & Plan Note (Addendum)
No hx of CAD, takes eletriptan as needed, has 2-3 episodes of migraine yearly. Will refill.

## 2015-02-24 NOTE — Assessment & Plan Note (Signed)
No chest pain. Cardiac MRI done- showed EF- 46%, The septum and anterior wall seemed more hypokinetic than the rest of the myocardium, no scar tissue. Pt denies any chest pain. Is on a BB- propanolol and HCTZ- quinapril. She has not taken these meds in a few days- she ran out.   Plan- Cont ACE, BB.

## 2015-02-24 NOTE — Assessment & Plan Note (Addendum)
Get eye records from ophtalmologist- [facility]. She also said they conducted a sleep study which she did at home. Will get records.  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 She has gotten laser treatment right eye and laser treatement for her left eye is been planned. Last eye exam- 04/2014. She was to see Dr Bland Span for treatment of OSA- per Opthalmologist note. Pt to sign release of infor next visit to get records.

## 2015-02-24 NOTE — Assessment & Plan Note (Signed)
BP Readings from Last 3 Encounters:  02/23/15 149/78  01/18/15 152/83  12/15/14 132/75    Lab Results  Component Value Date   NA 141 01/18/2015   K 4.0 01/18/2015   CREATININE 0.56* 01/18/2015    Assessment: Blood pressure control:  Still not at goal She has not taken  Bp meds in 2-3 days. Plan- emphasize compliance.

## 2015-03-02 ENCOUNTER — Encounter: Payer: Self-pay | Admitting: *Deleted

## 2015-03-29 ENCOUNTER — Telehealth: Payer: Self-pay | Admitting: Dietician

## 2015-04-08 ENCOUNTER — Emergency Department (HOSPITAL_COMMUNITY): Payer: No Typology Code available for payment source

## 2015-04-08 ENCOUNTER — Emergency Department (HOSPITAL_COMMUNITY)
Admission: EM | Admit: 2015-04-08 | Discharge: 2015-04-08 | Disposition: A | Discharge status: Home / Self Care | Payer: No Typology Code available for payment source | Attending: Emergency Medicine | Admitting: Emergency Medicine

## 2015-04-08 ENCOUNTER — Encounter (HOSPITAL_COMMUNITY): Payer: Self-pay | Admitting: Emergency Medicine

## 2015-04-08 DIAGNOSIS — J45901 Unspecified asthma with (acute) exacerbation: Secondary | ICD-10-CM | POA: Diagnosis not present

## 2015-04-08 DIAGNOSIS — Z8742 Personal history of other diseases of the female genital tract: Secondary | ICD-10-CM | POA: Insufficient documentation

## 2015-04-08 DIAGNOSIS — R51 Headache: Secondary | ICD-10-CM | POA: Insufficient documentation

## 2015-04-08 DIAGNOSIS — Z7984 Long term (current) use of oral hypoglycemic drugs: Secondary | ICD-10-CM | POA: Diagnosis not present

## 2015-04-08 DIAGNOSIS — Z8669 Personal history of other diseases of the nervous system and sense organs: Secondary | ICD-10-CM | POA: Diagnosis not present

## 2015-04-08 DIAGNOSIS — E785 Hyperlipidemia, unspecified: Secondary | ICD-10-CM | POA: Insufficient documentation

## 2015-04-08 DIAGNOSIS — Z8619 Personal history of other infectious and parasitic diseases: Secondary | ICD-10-CM | POA: Insufficient documentation

## 2015-04-08 DIAGNOSIS — I10 Essential (primary) hypertension: Secondary | ICD-10-CM | POA: Diagnosis not present

## 2015-04-08 DIAGNOSIS — E119 Type 2 diabetes mellitus without complications: Secondary | ICD-10-CM | POA: Insufficient documentation

## 2015-04-08 DIAGNOSIS — R0602 Shortness of breath: Secondary | ICD-10-CM | POA: Diagnosis present

## 2015-04-08 DIAGNOSIS — R519 Headache, unspecified: Secondary | ICD-10-CM

## 2015-04-08 DIAGNOSIS — Z79899 Other long term (current) drug therapy: Secondary | ICD-10-CM | POA: Insufficient documentation

## 2015-04-08 LAB — CBC WITH DIFFERENTIAL/PLATELET
Basophils Absolute: 0 10*3/uL (ref 0.0–0.1)
Basophils Relative: 0 %
Eosinophils Absolute: 0.1 10*3/uL (ref 0.0–0.7)
Eosinophils Relative: 2 %
HCT: 34.1 % — ABNORMAL LOW (ref 36.0–46.0)
Hemoglobin: 11.3 g/dL — ABNORMAL LOW (ref 12.0–15.0)
Lymphocytes Relative: 21 %
Lymphs Abs: 1.6 10*3/uL (ref 0.7–4.0)
MCH: 31.7 pg (ref 26.0–34.0)
MCHC: 33.1 g/dL (ref 30.0–36.0)
MCV: 95.8 fL (ref 78.0–100.0)
Monocytes Absolute: 0.6 10*3/uL (ref 0.1–1.0)
Monocytes Relative: 8 %
Neutro Abs: 5.2 10*3/uL (ref 1.7–7.7)
Neutrophils Relative %: 69 %
Platelets: 326 10*3/uL (ref 150–400)
RBC: 3.56 MIL/uL — ABNORMAL LOW (ref 3.87–5.11)
RDW: 12.6 % (ref 11.5–15.5)
WBC: 7.6 10*3/uL (ref 4.0–10.5)

## 2015-04-08 LAB — COMPREHENSIVE METABOLIC PANEL
ALT: 22 U/L (ref 14–54)
AST: 21 U/L (ref 15–41)
Albumin: 3.8 g/dL (ref 3.5–5.0)
Alkaline Phosphatase: 83 U/L (ref 38–126)
Anion gap: 13 (ref 5–15)
BUN: 10 mg/dL (ref 6–20)
CO2: 23 mmol/L (ref 22–32)
Calcium: 9.6 mg/dL (ref 8.9–10.3)
Chloride: 102 mmol/L (ref 101–111)
Creatinine, Ser: 0.73 mg/dL (ref 0.44–1.00)
GFR calc Af Amer: >60 mL/min (ref >60)
GFR calc non Af Amer: >60 mL/min (ref >60)
Glucose, Bld: 230 mg/dL — ABNORMAL HIGH (ref 65–99)
Potassium: 4.1 mmol/L (ref 3.5–5.1)
Sodium: 138 mmol/L (ref 135–145)
Total Bilirubin: 0.2 mg/dL — ABNORMAL LOW (ref 0.3–1.2)
Total Protein: 6.7 g/dL (ref 6.5–8.1)

## 2015-04-08 LAB — I-STAT TROPONIN, ED: Troponin i, poc: 0 ng/mL (ref 0.00–0.08)

## 2015-04-08 MED ORDER — IPRATROPIUM-ALBUTEROL 0.5-2.5 (3) MG/3ML IN SOLN
3.0000 mL | Freq: Once | RESPIRATORY_TRACT | Status: AC
  Administered 2015-04-08: 3 mL via RESPIRATORY_TRACT
  Filled 2015-04-08: qty 3

## 2015-04-08 MED ORDER — PREDNISONE 20 MG PO TABS: ORAL_TABLET | ORAL | Status: DC

## 2015-04-08 MED ORDER — DIPHENHYDRAMINE HCL 50 MG/ML IJ SOLN
12.5000 mg | Freq: Once | INTRAMUSCULAR | Status: AC
  Administered 2015-04-08: 12.5 mg via INTRAVENOUS
  Filled 2015-04-08: qty 1

## 2015-04-08 MED ORDER — ALBUTEROL SULFATE HFA 108 (90 BASE) MCG/ACT IN AERS: 1.0000 | INHALATION_SPRAY | Freq: Four times a day (QID) | RESPIRATORY_TRACT | Status: DC | PRN

## 2015-04-08 MED ORDER — PREDNISONE 20 MG PO TABS: 60.0000 mg | ORAL_TABLET | Freq: Once | ORAL | Status: DC

## 2015-04-08 MED ORDER — METOCLOPRAMIDE HCL 5 MG/ML IJ SOLN
10.0000 mg | Freq: Once | INTRAMUSCULAR | Status: AC
  Administered 2015-04-08: 10 mg via INTRAVENOUS
  Filled 2015-04-08: qty 2

## 2015-04-08 NOTE — ED Notes (Signed)
Patient states hypertension ([id number] systolic at home).   Patient states she has been having shortness of breath since Sunday.   Patient NAD at triage.   Patient complains of headache and back ache also.

## 2015-04-08 NOTE — ED Provider Notes (Signed)
CSN: [id number]     Arrival date & time 04/08/15  [id number] History   First MD Initiated Contact with Patient 04/08/15 [phone]     Chief Complaint  Patient presents with  . Shortness of Breath  . Hypertension     (Consider location/radiation/quality/duration/timing/severity/associated sxs/prior Treatment) The history is provided by the patient.  KALITA NASUTI is a 50 y.o. female hx of DM, migraines, HTN here with shortness of breath, headache, hypertension. Shortness of breath with exertion for the last 5 days. States that she also felt like she is wheezing as well and this is similar to her previous asthma exacerbation. Denies any chest pain. Also has been having intermittent headaches and back pain as well. Denies vomiting. States that her blood pressure has been running between 160-190s at home and she is taking her quinapril/HCTZ as prescribed and had no recent changes in BP meds. Denies fever or neck stiffness.    Past Medical History  Diagnosis Date  . Diabetes mellitus type 2, uncontrolled (HCC) DX: 1999    Started as gestational diabetes  . HSV (herpes simplex virus) anogenital infection   . Migraine     Normal CT head (08/06/2006)  . ESSENTIAL HYPERTENSION 07/08/2006  . HYPERLIPIDEMIA 08/20/2007  . Menorrhagia   . Asthma     as a child  . Carpal tunnel syndrome   . Swelling of abdominal wall 03/02/2014   Past Surgical History  Procedure Laterality Date  . Cholecystectomy  2009  . Tubal ligation     Family History  Problem Relation Age of Onset  . Hypertension Father   . Diabetes Father    Social History  Substance Use Topics  . Smoking status: Never Smoker   . Smokeless tobacco: Never Used  . Alcohol Use: No   OB History    Gravida Para Term Preterm AB TAB SAB Ectopic Multiple Living   2 1 1  1 1    1      Review of Systems  Respiratory: Positive for shortness of breath.   Neurological: Positive for headaches.  All other systems reviewed and are  negative.     Allergies  Review of patient's allergies indicates no known allergies.  Home Medications   Prior to Admission medications   Medication Sig Start Date End Date Taking? Authorizing Provider  atorvastatin (LIPITOR) 20 MG tablet Take 1 tablet (20 mg total) by mouth daily at 6 PM. 02/23/15   Ejiroghene E Emokpae, MD  eletriptan (RELPAX) 20 MG tablet Take 1 tablet (20 mg total) by mouth as needed for migraine or headache. May repeat in 2 hours if headache persists or recurs. 02/23/15   Ejiroghene Arlyce Dice, MD  glipiZIDE (GLUCOTROL) 10 MG tablet Take 1 tablet (10 mg total) by mouth 2 (two) times daily before a meal. 01/18/15 02/27/16  Ejiroghene E Emokpae, MD  glucose blood (FREESTYLE TEST STRIPS) test strip Use as instructed 04/13/14   Ejiroghene E Denton Brick, MD  HYDROcodone-acetaminophen (HYCET) 7.5-325 mg/15 ml solution Take 15 mLs by mouth 4 (four) times daily as needed for moderate pain. 12/15/14 12/15/15  Tanna Furry, MD  Lancets (FREESTYLE) lancets Use as instructed 04/13/14   Ejiroghene Arlyce Dice, MD  levocetirizine (XYZAL) 5 MG tablet Take 5 mg by mouth every evening.    Historical Provider, MD  medroxyPROGESTERone (PROVERA) 10 MG tablet TAKE 2 TABLETS BY MOUTH EVERY DAY - CAN INCREASE TO TWO TABLETS TWICE A DAY FOR HEAVY BLEEDING Patient not taking: Reported on 05/07/2014 11/20/13  Osborne Oman, MD  metFORMIN (GLUCOPHAGE XR) 500 MG 24 hr tablet Take 1 tablet (500 mg total) by mouth daily with breakfast. 01/18/15 01/18/16  Ejiroghene Arlyce Dice, MD  propranolol (INDERAL) 20 MG tablet Take 1 tablet (20 mg total) by mouth 2 (two) times daily. 02/23/15   Ejiroghene Arlyce Dice, MD  quinapril-hydrochlorothiazide (ACCURETIC) 20-12.5 MG tablet Take 1 tablet by mouth daily. 02/23/15   Ejiroghene Arlyce Dice, MD  sitaGLIPtin (JANUVIA) 100 MG tablet Take 1 tablet (100 mg total) by mouth daily. 01/18/15   Ejiroghene E Emokpae, MD   BP 132/66 mmHg  Pulse 82  Temp(Src) 98.1 F (36.7 C) (Oral)   Resp 16  Ht 5' (1.524 m)  Wt 221 lb (100.245 kg)  BMI 43.16 kg/m2  SpO2 98%  LMP 03/24/2015 Physical Exam  Constitutional: She appears well-developed and well-nourished.  HENT:  Head: Normocephalic.  Mouth/Throat: Oropharynx is clear and moist.  Eyes: Conjunctivae are normal. Pupils are equal, round, and reactive to light.  Neck: Normal range of motion. Neck supple.  No meningeal signs   Cardiovascular: Normal rate, regular rhythm and normal heart sounds.   Pulmonary/Chest: Effort normal.  Diminished throughout, no obvious wheezing   Abdominal: Soft. Bowel sounds are normal. She exhibits no distension. There is no tenderness. There is no rebound.  Musculoskeletal: Normal range of motion. She exhibits no edema.  Neurological: She is alert. No cranial nerve deficit. Coordination normal.  CN 2-12 intact, nl strength throughout   Skin: Skin is warm and dry.  Psychiatric: She has a normal mood and affect. Her behavior is normal. Judgment and thought content normal.  Nursing note and vitals reviewed.   ED Course  Procedures (including critical care time) Labs Review Labs Reviewed  CBC WITH DIFFERENTIAL/PLATELET - Abnormal; Notable for the following:    RBC 3.56 (*)    Hemoglobin 11.3 (*)    HCT 34.1 (*)    All other components within normal limits  COMPREHENSIVE METABOLIC PANEL - Abnormal; Notable for the following:    Glucose, Bld 230 (*)    Total Bilirubin 0.2 (*)    All other components within normal limits  Randolm Idol, ED    Imaging Review Dg Chest 2 View  04/08/2015  CLINICAL DATA:  Shortness of breath, right-sided chest pain. EXAM: CHEST  2 VIEW COMPARISON:  December 15, 2014. FINDINGS: The heart size and mediastinal contours are within normal limits. Both lungs are clear. No pneumothorax or pleural effusion is noted. The visualized skeletal structures are unremarkable. IMPRESSION: No active cardiopulmonary disease. Electronically Signed   By: Marijo Conception, M.D.    On: 04/08/2015 08:45   I have personally reviewed and evaluated these images and lab results as part of my medical decision-making.   EKG Interpretation   Date/Time:  Friday April 08 2015 07:56:20 EDT Ventricular Rate:  84 PR Interval:  148 QRS Duration: 132 QT Interval:  426 QTC Calculation: 503 R Axis:   91 Text Interpretation:  Normal sinus rhythm Rightward axis Non-specific  intra-ventricular conduction block Cannot rule out Septal infarct , age  undetermined Abnormal ECG No significant change since last tracing  Confirmed by YAO  MD, DAVID ([id number]) on 04/08/2015 9:22:07 AM      MDM   Final diagnoses:  None   Karlyn A Simar is a 50 y.o. female here with shortness of breath, headaches, HTN. Not tachy or febrile. No cardiac hx and symptoms for 6 days and I have low suspicion for ACS or  PE. Likely asthma exacerbation. Has nonfocal neuro exam currently. Will get labs, trop x 1, will give migraine cocktail, nebs and reassess.  10:57 AM Has minimal wheezing after 1 neb. CXR showed no pneumonia. Labs at baseline. Headache improved with migraine cocktail. BP 160 on arrival, went down to 130/66 on discharge with nebs and migraine cocktail. No signs of hypertensive urgency. Will dc home with albuterol, prednisone.     Wandra Arthurs, MD 04/08/15 1058

## 2015-04-08 NOTE — Discharge Instructions (Signed)
Take prednisone as prescribed for asthma.   Use albuterol every 4-6 hrs as needed for cough or shortness of breath.   Take your BP meds as prescribed.   Take tylenol, motrin for headaches.   See your doctor   Return to ER if you have worse headaches, trouble breathing, chest pain, shortness of breath

## 2015-04-08 NOTE — ED Notes (Signed)
Doctor at bedside.

## 2015-04-18 NOTE — Telephone Encounter (Signed)
Calling to follow up on diabetes self care. Note patient recently in the emergency department and put on a short course of steroids. her last A1C was 8.9#.

## 2015-04-19 NOTE — Telephone Encounter (Signed)
Left two messages for patient. She is due for a repeat A1C and emergency room follow up

## 2015-06-01 ENCOUNTER — Other Ambulatory Visit: Payer: Self-pay | Admitting: Internal Medicine

## 2015-07-06 ENCOUNTER — Encounter: Payer: Self-pay | Admitting: *Deleted

## 2015-08-22 ENCOUNTER — Encounter: Payer: Self-pay | Admitting: Internal Medicine

## 2015-09-15 ENCOUNTER — Encounter: Payer: Self-pay | Admitting: Internal Medicine

## 2015-09-15 ENCOUNTER — Encounter: Payer: No Typology Code available for payment source | Admitting: Internal Medicine

## 2015-09-15 NOTE — Progress Notes (Deleted)
Called pt's pharmacy for fill history:   albuterol (PROVENTIL HFA;VENTOLIN HFA) 108 (90 Base) MCG/ACT inhaler - last filled 3/17, q. 1    atorvastatin (LIPITOR) 20 MG tablet, last filled 8/2, q. 30    eletriptan (RELPAX) 20 MG tablet, last filled 2/6, q. 10    glipiZIDE (GLUCOTROL) 10 MG tablet, last filled 7/6, q. 180    glucose blood (FREESTYLE TEST STRIPS) test strip    HYDROcodone-acetaminophen (HYCET) 7.5-325 mg/15 ml solution - last filled 12/15/14        levocetirizine (XYZAL) 5 MG tablet - last filled 12/12/14    medroxyPROGESTERone (PROVERA) 10 MG tablet - no recent fills in past year    metFORMIN (GLUCOPHAGE XR) 500 MG 24 hr tablet - last filled 08/24/15, q. 90    predniSONE (DELTASONE) 20 MG tablet, last filled 04/08/15, Q 12    propranolol (INDERAL) 20 MG tablet, last filled 08/20/15 q.60    quinapril-hydrochlorothiazide (ACCURETIC) 20-12.5 MG tablet, last filled 08/20/15 q. 30    sitaGLIPtin (JANUVIA) 100 MG tablet, last filled 05/19/15 q [place] PharmD Candidate, c/o 2019 09/15/2015 2:22 PM

## 2015-11-04 ENCOUNTER — Other Ambulatory Visit: Payer: Self-pay | Admitting: *Deleted

## 2015-11-04 DIAGNOSIS — E1169 Type 2 diabetes mellitus with other specified complication: Secondary | ICD-10-CM

## 2015-11-04 DIAGNOSIS — E669 Obesity, unspecified: Principal | ICD-10-CM

## 2015-11-04 MED ORDER — METFORMIN HCL ER 500 MG PO TB24: 500.0000 mg | ORAL_TABLET | Freq: Every day | ORAL | 1 refills | Status: DC

## 2015-11-04 NOTE — Telephone Encounter (Signed)
Pt called to schedule an appt - no answer; left message.

## 2015-11-11 ENCOUNTER — Other Ambulatory Visit: Payer: Self-pay

## 2015-11-11 MED ORDER — ELETRIPTAN HYDROBROMIDE 20 MG PO TABS: 20.0000 mg | ORAL_TABLET | ORAL | 0 refills | Status: DC | PRN

## 2015-11-11 NOTE — Telephone Encounter (Signed)
metFORMIN (GLUCOPHAGE XR) 500 MG 24 hr tablet,   eletriptan (RELPAX) 20 MG tablet, refill request. Also please call pt back.

## 2015-11-11 NOTE — Telephone Encounter (Signed)
Informed pt Metformin rx was refilled on 10/13 and sent to [facility]. Pt aware she needs to schedule an appt - states she has to inform her job before scheduling an appt.

## 2015-12-27 ENCOUNTER — Telehealth: Payer: Self-pay | Admitting: Internal Medicine

## 2015-12-27 NOTE — Telephone Encounter (Signed)
APT. REMINDER CALL, LMTCB °

## 2015-12-28 ENCOUNTER — Encounter: Payer: No Typology Code available for payment source | Admitting: Internal Medicine

## 2016-01-03 ENCOUNTER — Other Ambulatory Visit: Payer: Self-pay | Admitting: *Deleted

## 2016-01-03 DIAGNOSIS — I10 Essential (primary) hypertension: Secondary | ICD-10-CM

## 2016-01-03 MED ORDER — PROPRANOLOL HCL 20 MG PO TABS: 20.0000 mg | ORAL_TABLET | Freq: Two times a day (BID) | ORAL | 3 refills | Status: DC

## 2016-01-19 ENCOUNTER — Other Ambulatory Visit: Payer: Self-pay

## 2016-01-19 DIAGNOSIS — E669 Obesity, unspecified: Principal | ICD-10-CM

## 2016-01-19 DIAGNOSIS — E1169 Type 2 diabetes mellitus with other specified complication: Secondary | ICD-10-CM

## 2016-01-19 NOTE — Telephone Encounter (Signed)
sitaGLIPtin (JANUVIA) 100 MG tablet, refill request @ [facility] on [address]. Pt is out of med, please call pt back.

## 2016-01-24 ENCOUNTER — Telehealth: Payer: Self-pay | Admitting: *Deleted

## 2016-01-24 MED ORDER — SITAGLIPTIN PHOSPHATE 100 MG PO TABS: 100.0000 mg | ORAL_TABLET | Freq: Every day | ORAL | 0 refills | Status: DC

## 2016-01-24 NOTE — Telephone Encounter (Signed)
-----   Message from Bartholomew Crews, MD sent at 01/24/2016  2:03 PM EST ----- Regular one is OK. I will ask the refill nurse to emphasize refill should only be 30 days (feel free to send refill request to me) and pt must be seen in [facility] within next 30 days. If she no shows then, I will write letter to her. Thanks ----- Message ----- From: Meriam Sprague Sent: 01/24/2016  11:27 AM To: Bartholomew Crews, MD  Pt has not been seen since Feb 2017.  Asking for refill on meds. No showed in Aug and canceled in Dec.  Do you want to send one of your special letters or can I just sent a regular one?  Please let me know.  Thanks,  [employer]

## 2016-01-24 NOTE — Telephone Encounter (Signed)
Will write letter tomorrow PM stating our expectations.

## 2016-01-24 NOTE — Telephone Encounter (Signed)
Called and talked to pt about scheduling an appt  And Januvia has been refilled for 30 days. Asked to schedule an appt - she stated not today. Emphasis to call back within next 30 days to continue her refills; stated "ok sweetheart;I just have to find me another doctor".

## 2016-01-25 MED ORDER — ATORVASTATIN CALCIUM 20 MG PO TABS: 20.0000 mg | ORAL_TABLET | Freq: Every day | ORAL | 6 refills | Status: DC

## 2016-01-25 NOTE — Addendum Note (Signed)
Addended by: Marcelino Duster on: 01/25/2016 08:40 AM   Modules accepted: Orders

## 2016-01-30 ENCOUNTER — Other Ambulatory Visit: Payer: Self-pay | Admitting: *Deleted

## 2016-02-09 ENCOUNTER — Encounter: Payer: No Typology Code available for payment source | Admitting: Internal Medicine

## 2016-02-13 ENCOUNTER — Encounter: Payer: Self-pay | Admitting: Internal Medicine

## 2016-02-21 ENCOUNTER — Encounter (HOSPITAL_COMMUNITY): Payer: Self-pay | Admitting: Emergency Medicine

## 2016-02-21 ENCOUNTER — Emergency Department (HOSPITAL_COMMUNITY): Payer: BLUE CROSS/BLUE SHIELD

## 2016-02-21 ENCOUNTER — Emergency Department (HOSPITAL_COMMUNITY)
Admission: EM | Admit: 2016-02-21 | Discharge: 2016-02-21 | Disposition: A | Discharge status: Home / Self Care | Payer: BLUE CROSS/BLUE SHIELD | Attending: Emergency Medicine | Admitting: Emergency Medicine

## 2016-02-21 DIAGNOSIS — R509 Fever, unspecified: Secondary | ICD-10-CM | POA: Diagnosis present

## 2016-02-21 DIAGNOSIS — R112 Nausea with vomiting, unspecified: Secondary | ICD-10-CM | POA: Insufficient documentation

## 2016-02-21 DIAGNOSIS — I10 Essential (primary) hypertension: Secondary | ICD-10-CM | POA: Diagnosis not present

## 2016-02-21 DIAGNOSIS — R05 Cough: Secondary | ICD-10-CM | POA: Diagnosis not present

## 2016-02-21 DIAGNOSIS — E119 Type 2 diabetes mellitus without complications: Secondary | ICD-10-CM | POA: Insufficient documentation

## 2016-02-21 DIAGNOSIS — Z794 Long term (current) use of insulin: Secondary | ICD-10-CM | POA: Diagnosis not present

## 2016-02-21 DIAGNOSIS — J45909 Unspecified asthma, uncomplicated: Secondary | ICD-10-CM | POA: Diagnosis not present

## 2016-02-21 DIAGNOSIS — Z5181 Encounter for therapeutic drug level monitoring: Secondary | ICD-10-CM | POA: Insufficient documentation

## 2016-02-21 DIAGNOSIS — R0981 Nasal congestion: Secondary | ICD-10-CM | POA: Insufficient documentation

## 2016-02-21 DIAGNOSIS — Z79899 Other long term (current) drug therapy: Secondary | ICD-10-CM | POA: Diagnosis not present

## 2016-02-21 DIAGNOSIS — R6889 Other general symptoms and signs: Secondary | ICD-10-CM

## 2016-02-21 LAB — COMPREHENSIVE METABOLIC PANEL
ALT: 26 U/L (ref 14–54)
AST: 31 U/L (ref 15–41)
Albumin: 3.8 g/dL (ref 3.5–5.0)
Alkaline Phosphatase: 88 U/L (ref 38–126)
Anion gap: 7 (ref 5–15)
BUN: 9 mg/dL (ref 6–20)
CO2: 23 mmol/L (ref 22–32)
Calcium: 9 mg/dL (ref 8.9–10.3)
Chloride: 108 mmol/L (ref 101–111)
Creatinine, Ser: 0.73 mg/dL (ref 0.44–1.00)
GFR calc Af Amer: >60 mL/min (ref >60)
GFR calc non Af Amer: >60 mL/min (ref >60)
Glucose, Bld: 281 mg/dL — ABNORMAL HIGH (ref 65–99)
Potassium: 3.9 mmol/L (ref 3.5–5.1)
Sodium: 138 mmol/L (ref 135–145)
Total Bilirubin: 0.7 mg/dL (ref 0.3–1.2)
Total Protein: 6.7 g/dL (ref 6.5–8.1)

## 2016-02-21 LAB — URINALYSIS, ROUTINE W REFLEX MICROSCOPIC
Bilirubin Urine: NEGATIVE
Glucose, UA: >=500 mg/dL — AB
Hgb urine dipstick: NEGATIVE
Ketones, ur: NEGATIVE mg/dL
Leukocytes, UA: NEGATIVE
Nitrite: NEGATIVE
Protein, ur: NEGATIVE mg/dL
Specific Gravity, Urine: 1.011 (ref 1.005–1.030)
pH: 5 (ref 5.0–8.0)

## 2016-02-21 LAB — PROTIME-INR
INR: 1.06
Prothrombin Time: 13.9 seconds (ref 11.4–15.2)

## 2016-02-21 LAB — CBC WITH DIFFERENTIAL/PLATELET
Basophils Absolute: 0 10*3/uL (ref 0.0–0.1)
Basophils Relative: 0 %
Eosinophils Absolute: 0.1 10*3/uL (ref 0.0–0.7)
Eosinophils Relative: 1 %
HCT: 33.6 % — ABNORMAL LOW (ref 36.0–46.0)
Hemoglobin: 11.3 g/dL — ABNORMAL LOW (ref 12.0–15.0)
Lymphocytes Relative: 6 %
Lymphs Abs: 0.7 10*3/uL (ref 0.7–4.0)
MCH: 31.8 pg (ref 26.0–34.0)
MCHC: 33.6 g/dL (ref 30.0–36.0)
MCV: 94.6 fL (ref 78.0–100.0)
Monocytes Absolute: 0.4 10*3/uL (ref 0.1–1.0)
Monocytes Relative: 4 %
Neutro Abs: 9.7 10*3/uL — ABNORMAL HIGH (ref 1.7–7.7)
Neutrophils Relative %: 89 %
Platelets: 277 10*3/uL (ref 150–400)
RBC: 3.55 MIL/uL — ABNORMAL LOW (ref 3.87–5.11)
RDW: 12.5 % (ref 11.5–15.5)
WBC: 10.9 10*3/uL — ABNORMAL HIGH (ref 4.0–10.5)

## 2016-02-21 LAB — I-STAT CG4 LACTIC ACID, ED
Lactic Acid, Venous: 2.12 mmol/L (ref 0.5–1.9)
Lactic Acid, Venous: 1.21 mmol/L (ref 0.5–1.9)

## 2016-02-21 LAB — LIPASE, BLOOD: Lipase: 17 U/L (ref 11–51)

## 2016-02-21 LAB — RAPID STREP SCREEN (MED CTR MEBANE ONLY): Streptococcus, Group A Screen (Direct): NEGATIVE

## 2016-02-21 MED ORDER — KETOROLAC TROMETHAMINE 30 MG/ML IJ SOLN
30.0000 mg | Freq: Once | INTRAMUSCULAR | Status: AC
  Administered 2016-02-21: 30 mg via INTRAVENOUS
  Filled 2016-02-21: qty 1

## 2016-02-21 MED ORDER — ACETAMINOPHEN 325 MG PO TABS
ORAL_TABLET | ORAL | Status: AC
  Filled 2016-02-21: qty 2

## 2016-02-21 MED ORDER — OSELTAMIVIR PHOSPHATE 75 MG PO CAPS: 75.0000 mg | ORAL_CAPSULE | Freq: Two times a day (BID) | ORAL | 0 refills | Status: DC

## 2016-02-21 MED ORDER — ONDANSETRON 4 MG PO TBDP
4.0000 mg | ORAL_TABLET | Freq: Once | ORAL | Status: AC | PRN
  Administered 2016-02-21: 4 mg via ORAL

## 2016-02-21 MED ORDER — ACETAMINOPHEN 325 MG PO TABS
650.0000 mg | ORAL_TABLET | Freq: Once | ORAL | Status: AC | PRN
  Administered 2016-02-21: 650 mg via ORAL

## 2016-02-21 MED ORDER — SODIUM CHLORIDE 0.9 % IV BOLUS (SEPSIS)
1000.0000 mL | Freq: Once | INTRAVENOUS | Status: AC
  Administered 2016-02-21: 1000 mL via INTRAVENOUS

## 2016-02-21 MED ORDER — IBUPROFEN 600 MG PO TABS: 600.0000 mg | ORAL_TABLET | Freq: Four times a day (QID) | ORAL | 0 refills | Status: DC | PRN

## 2016-02-21 MED ORDER — ONDANSETRON 4 MG PO TBDP
ORAL_TABLET | ORAL | Status: AC
  Filled 2016-02-21: qty 1

## 2016-02-21 NOTE — ED Provider Notes (Signed)
[facility] DEPT Provider Note   CSN: [id number] Arrival date & time: 02/21/16  [id number]   By signing my name below, I, Delton Prairie, attest that this documentation has been prepared under the direction and in the presence of  Shary Decamp, PA-C. Electronically Signed: Delton Prairie, ED Scribe. 02/21/16. 9:28 AM.   History   Chief Complaint Chief Complaint  Patient presents with  . Influenza   The history is provided by the patient. No language interpreter was used.   HPI Comments:  Sandra [name] is a 51 y.o. female who presents to the Emergency Department complaining of generalized body aches onset yesterday. She also reports rhinorrhea, congestion, chills, cough, fever (tmax 101.8), headache, nausea, vomiting, diarrhea, right sided chest pain, SOB and recent sick contacts (works in childcare). She was given tylenol in the ED with no significant relief. No recent antibiotic use or flu shot this year. No other associated symptoms noted.   Past Medical History:  Diagnosis Date  . Asthma    as a child  . Carpal tunnel syndrome   . Diabetes mellitus type 2, uncontrolled (HCC) DX: 1999   Started as gestational diabetes  . ESSENTIAL HYPERTENSION 07/08/2006  . HSV (herpes simplex virus) anogenital infection   . HYPERLIPIDEMIA 08/20/2007  . Menorrhagia   . Migraine    Normal CT head (08/06/2006)  . Swelling of abdominal wall 03/02/2014    Patient Active Problem List   Diagnosis Date Noted  . Low back pain 03/02/2014  . Swelling of abdominal wall 03/02/2014  . Psoriasis 04/29/2013  . Adenomyosis 06/03/2012  . Obesity 05/16/2012  . Decreased ejection fraction 02/22/2012  . Perimenopausal menorrhagia 02/20/2012  . LBBB (left bundle branch block) 01/24/2012  . Preventative health care 09/27/2010  . 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 HYPERLIPIDEMIA 08/20/2007  . Migraine without aura 09/02/2006  . Essential hypertension 07/08/2006  . HSV 11/07/2005  .  ASTHMA 11/07/2005    Past Surgical History:  Procedure Laterality Date  . CHOLECYSTECTOMY  2009  . TUBAL LIGATION      OB History    Gravida Para Term Preterm AB Living   2 1 1   1 1    SAB TAB Ectopic Multiple Live Births     1             Home Medications    Prior to Admission medications   Medication Sig Start Date End Date Taking? Authorizing Provider  albuterol (PROVENTIL HFA;VENTOLIN HFA) 108 (90 Base) MCG/ACT inhaler Inhale 1-2 puffs into the lungs every 6 (six) hours as needed for wheezing or shortness of breath. 04/08/15   Drenda Freeze, MD  atorvastatin (LIPITOR) 20 MG tablet Take 1 tablet (20 mg total) by mouth daily at 6 PM. 01/25/16   Ophelia Shoulder, MD  eletriptan (RELPAX) 20 MG tablet Take 1 tablet (20 mg total) by mouth as needed for migraine or headache. May repeat in 2 hours if headache persists or recurs. 11/11/15   Ophelia Shoulder, MD  glipiZIDE (GLUCOTROL) 10 MG tablet Take 1 tablet (10 mg total) by mouth 2 (two) times daily before a meal. 01/18/15 02/27/16  Ejiroghene E Emokpae, MD  glucose blood (FREESTYLE TEST STRIPS) test strip Use as instructed 04/13/14   Ejiroghene E Denton Brick, MD  Lancets (FREESTYLE) lancets Use as instructed 04/13/14   Ejiroghene E Denton Brick, MD  levocetirizine (XYZAL) 5 MG tablet Take 5 mg by mouth every evening.    Historical Provider, MD  medroxyPROGESTERone (PROVERA)  10 MG tablet TAKE 2 TABLETS BY MOUTH EVERY DAY - CAN INCREASE TO TWO TABLETS TWICE A DAY FOR HEAVY BLEEDING Patient not taking: Reported on 05/07/2014 11/20/13   Osborne Oman, MD  metFORMIN (GLUCOPHAGE XR) 500 MG 24 hr tablet Take 1 tablet (500 mg total) by mouth daily with breakfast. 11/04/15 11/03/16  Ophelia Shoulder, MD  predniSONE (DELTASONE) 20 MG tablet Take 60 mg daily x 2 days then 40 mg daily x 2 days then 20 mg daily x 2 days 04/08/15   Drenda Freeze, MD  propranolol (INDERAL) 20 MG tablet Take 1 tablet (20 mg total) by mouth 2 (two) times daily. 01/03/16   Ophelia Shoulder,  MD  quinapril-hydrochlorothiazide (ACCURETIC) 20-12.5 MG tablet Take 1 tablet by mouth daily. 02/23/15   Ejiroghene Arlyce Dice, MD  sitaGLIPtin (JANUVIA) 100 MG tablet Take 1 tablet (100 mg total) by mouth daily. You must be seen to receive any additional medications 01/24/16   Bartholomew Crews, MD    Family History Family History  Problem Relation Age of Onset  . Hypertension Father   . Diabetes Father     Social History Social History  Substance Use Topics  . Smoking status: Never Smoker  . Smokeless tobacco: Never Used  . Alcohol use No     Allergies   Patient has no known allergies.   Review of Systems Review of Systems 10 systems reviewed and all are negative for acute change except as noted in the HPI.  Physical Exam Updated Vital Signs BP 157/83   Pulse 105   Temp 101.8 F (38.8 C) (Oral)   Ht 5' (1.524 m)   Wt 215 lb (97.5 kg)   SpO2 96%   BMI 41.99 kg/m   Physical Exam  Constitutional: She is oriented to person, place, and time. She appears well-developed and well-nourished. No distress.  HENT:  Head: Normocephalic and atraumatic.  Right Ear: Tympanic membrane, external ear and ear canal normal.  Left Ear: Tympanic membrane, external ear and ear canal normal.  Nose: Nose normal.  Mouth/Throat: Uvula is midline, oropharynx is clear and moist and mucous membranes are normal. No trismus in the jaw. No oropharyngeal exudate, posterior oropharyngeal erythema or tonsillar abscesses.  Eyes: Conjunctivae and EOM are normal. Pupils are equal, round, and reactive to light.  Neck: Normal range of motion. Neck supple. No tracheal deviation present.  Cardiovascular: Regular rhythm, S1 normal, S2 normal, normal heart sounds, intact distal pulses and normal pulses.  Tachycardia present.   Pulmonary/Chest: Effort normal and breath sounds normal. No respiratory distress. She has no decreased breath sounds. She has no wheezes. She has no rhonchi. She has no rales.    Abdominal: Normal appearance and bowel sounds are normal. She exhibits no distension. There is no tenderness.  Musculoskeletal: Normal range of motion.  Neurological: She is alert and oriented to person, place, and time.  Skin: Skin is warm and dry.  Psychiatric: She has a normal mood and affect. Her speech is normal and behavior is normal. Thought content normal.  Nursing note and vitals reviewed.  ED Treatments / Results  DIAGNOSTIC STUDIES:  Oxygen Saturation is 96% on RA, normal by my interpretation.    COORDINATION OF CARE:  9:27 AM Discussed treatment plan with pt at bedside and pt agreed to plan.  Labs (all labs ordered are listed, but only abnormal results are displayed) Labs Reviewed  COMPREHENSIVE METABOLIC PANEL - Abnormal; Notable for the following:  Result Value   Glucose, Bld 281 (*)    All other components within normal limits  CBC WITH DIFFERENTIAL/PLATELET - Abnormal; Notable for the following:    WBC 10.9 (*)    RBC 3.55 (*)    Hemoglobin 11.3 (*)    HCT 33.6 (*)    Neutro Abs 9.7 (*)    All other components within normal limits  I-STAT CG4 LACTIC ACID, ED - Abnormal; Notable for the following:    Lactic Acid, Venous 2.12 (*)    All other components within normal limits  RAPID STREP SCREEN (NOT AT [facility])  CULTURE, BLOOD (ROUTINE X 2)  CULTURE, BLOOD (ROUTINE X 2)  URINE CULTURE  CULTURE, GROUP A STREP ([place])  PROTIME-INR  LIPASE, BLOOD  URINALYSIS, ROUTINE W REFLEX MICROSCOPIC   EKG  EKG Interpretation None      Radiology Dg Chest 2 View  Result Date: 02/21/2016 CLINICAL DATA:  Fever and cough since yesterday. EXAM: CHEST  2 VIEW COMPARISON:  PA and lateral chest 04/08/2015 and 12/15/2014. FINDINGS: The lungs are clear. Heart size is normal. No pneumothorax or pleural fluid. No acute bony abnormality. IMPRESSION: No acute disease. Electronically Signed   By: Inge Rise M.D.   On: 02/21/2016 08:06    Procedures Procedures (including  critical care time)  Medications Ordered in ED Medications  sodium chloride 0.9 % bolus 1,000 mL (not administered)  ondansetron (ZOFRAN-ODT) disintegrating tablet 4 mg (4 mg Oral Given 02/21/16 0708)  acetaminophen (TYLENOL) tablet 650 mg (650 mg Oral Given 02/21/16 0708)     Initial Impression / Assessment and Plan / ED Course  I have reviewed the triage vital signs and the nursing notes.  Pertinent labs & imaging results that were available during my care of the patient were reviewed by me and considered in my medical decision making (see chart for details).     Final Clinical Impressions(s) / ED Diagnoses  {I have reviewed and evaluated the relevant laboratory values. {I have reviewed and evaluated the relevant imaging studies.  {I have reviewed the relevant previous healthcare records.  {I obtained HPI from historian.   ED Course:  Assessment: Patient with symptoms consistent with influenza.  Vitals are stable, low-grade fever.  No signs of dehydration, tolerating PO's.  Lungs are clear. CXR negative for infiltrate. CBC with mild leukocytosis. Hyperglycemia with glucose 281 without anion gap. Lactic 2.12. Normotensive. Doubt septicemia. Patient will be discharged with Tamiflu and instructions to orally hydrate, rest, and use over-the-counter medications such as anti-inflammatories ibuprofen and Aleve for muscle aches and Tylenol for fever. Given fluids in ED. Patient will also be given a cough suppressant. At time of discharge, Patient is in no acute distress. Vital Signs are stable. Patient is able to ambulate. Patient able to tolerate PO.   Disposition/Plan:  DC Home Additional Verbal discharge instructions given and discussed with patient.  Pt Instructed to f/u with PCP in the next week for evaluation and treatment of symptoms. Return precautions given Pt acknowledges and agrees with plan  Supervising Physician Lajean Saver, MD  Final diagnoses:  Flu-like symptoms    New  Prescriptions New Prescriptions   No medications on file   I personally performed the services described in this documentation, which was scribed in my presence. The recorded information has been reviewed and is accurate.     Shary Decamp, PA-C 02/21/16 [place], MD 02/21/16 [phone]

## 2016-02-21 NOTE — ED Triage Notes (Signed)
Pt c/o body aches, sore throat, headache and fever onset yesterday. 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

## 2016-02-21 NOTE — Discharge Instructions (Signed)
Please read and follow all provided instructions.  Your diagnoses today include:  1. Flu-like symptoms     Tests performed today include: Vital signs. See below for your results today.   Medications prescribed:   Take any prescribed medications only as directed.  Home care instructions:  Follow any educational materials contained in this packet. Please continue drinking plenty of fluids. Use over-the-counter cold and flu medications as needed as directed on packaging for symptom relief. You may also use ibuprofen or tylenol as directed on packaging for pain or fever.   BE VERY CAREFUL not to take multiple medicines containing Tylenol (also called acetaminophen). Doing so can lead to an overdose which can damage your liver and cause liver failure and possibly death.   Follow-up instructions: Please follow-up with your primary care provider in the next 3 days for further evaluation of your symptoms.   Return instructions:  Please return to the Emergency Department if you experience worsening symptoms. Please return if you have a high fever greater than 101 degrees not controlled with over-the-counter medications, persistent vomiting and cannot keep down fluids, or worsening trouble breathing. Please return if you have any other emergent concerns.  Additional Information:  Your vital signs today were: BP 157/83    Pulse 105    Temp 101.8 F (38.8 C) (Oral)    Ht 5' (1.524 m)    Wt 97.5 kg    SpO2 96%    BMI 41.99 kg/m  If your blood pressure (BP) was elevated above 135/85 this visit, please have this repeated by your doctor within one month.

## 2016-02-22 LAB — URINE CULTURE

## 2016-02-23 ENCOUNTER — Observation Stay (HOSPITAL_COMMUNITY): Payer: BLUE CROSS/BLUE SHIELD

## 2016-02-23 ENCOUNTER — Encounter: Payer: Self-pay | Admitting: Internal Medicine

## 2016-02-23 ENCOUNTER — Ambulatory Visit (INDEPENDENT_AMBULATORY_CARE_PROVIDER_SITE_OTHER): Payer: BLUE CROSS/BLUE SHIELD | Admitting: Internal Medicine

## 2016-02-23 ENCOUNTER — Encounter (HOSPITAL_COMMUNITY): Payer: Self-pay

## 2016-02-23 ENCOUNTER — Observation Stay (HOSPITAL_COMMUNITY)
Admission: AD | Admit: 2016-02-23 | Discharge: 2016-02-24 | Disposition: A | Discharge status: Home / Self Care | Payer: BLUE CROSS/BLUE SHIELD | Source: Ambulatory Visit | Attending: Internal Medicine | Admitting: Internal Medicine

## 2016-02-23 ENCOUNTER — Encounter: Payer: No Typology Code available for payment source | Admitting: Internal Medicine

## 2016-02-23 DIAGNOSIS — Z7984 Long term (current) use of oral hypoglycemic drugs: Secondary | ICD-10-CM | POA: Diagnosis not present

## 2016-02-23 DIAGNOSIS — E872 Acidosis: Secondary | ICD-10-CM | POA: Insufficient documentation

## 2016-02-23 DIAGNOSIS — E119 Type 2 diabetes mellitus without complications: Secondary | ICD-10-CM | POA: Diagnosis not present

## 2016-02-23 DIAGNOSIS — J111 Influenza due to unidentified influenza virus with other respiratory manifestations: Secondary | ICD-10-CM

## 2016-02-23 DIAGNOSIS — I5020 Unspecified systolic (congestive) heart failure: Secondary | ICD-10-CM | POA: Diagnosis not present

## 2016-02-23 DIAGNOSIS — E1165 Type 2 diabetes mellitus with hyperglycemia: Secondary | ICD-10-CM | POA: Insufficient documentation

## 2016-02-23 DIAGNOSIS — G43909 Migraine, unspecified, not intractable, without status migrainosus: Secondary | ICD-10-CM | POA: Diagnosis not present

## 2016-02-23 DIAGNOSIS — I5022 Chronic systolic (congestive) heart failure: Secondary | ICD-10-CM

## 2016-02-23 DIAGNOSIS — R0602 Shortness of breath: Secondary | ICD-10-CM

## 2016-02-23 DIAGNOSIS — I110 Hypertensive heart disease with heart failure: Secondary | ICD-10-CM | POA: Diagnosis not present

## 2016-02-23 DIAGNOSIS — I447 Left bundle-branch block, unspecified: Secondary | ICD-10-CM | POA: Diagnosis not present

## 2016-02-23 DIAGNOSIS — J101 Influenza due to other identified influenza virus with other respiratory manifestations: Secondary | ICD-10-CM

## 2016-02-23 DIAGNOSIS — Z8679 Personal history of other diseases of the circulatory system: Secondary | ICD-10-CM

## 2016-02-23 DIAGNOSIS — Z808 Family history of malignant neoplasm of other organs or systems: Secondary | ICD-10-CM | POA: Insufficient documentation

## 2016-02-23 DIAGNOSIS — Z794 Long term (current) use of insulin: Secondary | ICD-10-CM | POA: Diagnosis not present

## 2016-02-23 DIAGNOSIS — E785 Hyperlipidemia, unspecified: Secondary | ICD-10-CM | POA: Diagnosis not present

## 2016-02-23 DIAGNOSIS — Z79899 Other long term (current) drug therapy: Secondary | ICD-10-CM | POA: Insufficient documentation

## 2016-02-23 DIAGNOSIS — D72829 Elevated white blood cell count, unspecified: Secondary | ICD-10-CM | POA: Diagnosis not present

## 2016-02-23 DIAGNOSIS — J112 Influenza due to unidentified influenza virus with gastrointestinal manifestations: Secondary | ICD-10-CM | POA: Diagnosis not present

## 2016-02-23 DIAGNOSIS — E669 Obesity, unspecified: Secondary | ICD-10-CM | POA: Insufficient documentation

## 2016-02-23 DIAGNOSIS — N920 Excessive and frequent menstruation with regular cycle: Secondary | ICD-10-CM | POA: Diagnosis not present

## 2016-02-23 DIAGNOSIS — G5600 Carpal tunnel syndrome, unspecified upper limb: Secondary | ICD-10-CM | POA: Insufficient documentation

## 2016-02-23 DIAGNOSIS — Z833 Family history of diabetes mellitus: Secondary | ICD-10-CM | POA: Diagnosis not present

## 2016-02-23 DIAGNOSIS — Z8249 Family history of ischemic heart disease and other diseases of the circulatory system: Secondary | ICD-10-CM | POA: Insufficient documentation

## 2016-02-23 DIAGNOSIS — R000 Tachycardia, unspecified: Secondary | ICD-10-CM

## 2016-02-23 LAB — LACTIC ACID, PLASMA
Lactic Acid, Venous: 1 mmol/L (ref 0.5–1.9)
Lactic Acid, Venous: 1.3 mmol/L (ref 0.5–1.9)

## 2016-02-23 LAB — INFLUENZA PANEL BY PCR (TYPE A & B)
Influenza A By PCR: POSITIVE — AB
Influenza B By PCR: NEGATIVE

## 2016-02-23 LAB — MAGNESIUM: Magnesium: 1.4 mg/dL — ABNORMAL LOW (ref 1.7–2.4)

## 2016-02-23 LAB — BASIC METABOLIC PANEL
Anion gap: 11 (ref 5–15)
BUN: 7 mg/dL (ref 6–20)
CO2: 25 mmol/L (ref 22–32)
Calcium: 8.7 mg/dL — ABNORMAL LOW (ref 8.9–10.3)
Chloride: 102 mmol/L (ref 101–111)
Creatinine, Ser: 0.8 mg/dL (ref 0.44–1.00)
GFR calc Af Amer: >60 mL/min (ref >60)
GFR calc non Af Amer: >60 mL/min (ref >60)
Glucose, Bld: 298 mg/dL — ABNORMAL HIGH (ref 65–99)
Potassium: 3.7 mmol/L (ref 3.5–5.1)
Sodium: 138 mmol/L (ref 135–145)

## 2016-02-23 LAB — CBC WITH DIFFERENTIAL/PLATELET
Basophils Absolute: 0 10*3/uL (ref 0.0–0.1)
Basophils Relative: 0 %
Eosinophils Absolute: 0 10*3/uL (ref 0.0–0.7)
Eosinophils Relative: 0 %
HCT: 30.3 % — ABNORMAL LOW (ref 36.0–46.0)
Hemoglobin: 10 g/dL — ABNORMAL LOW (ref 12.0–15.0)
Lymphocytes Relative: 21 %
Lymphs Abs: 1.6 10*3/uL (ref 0.7–4.0)
MCH: 31.8 pg (ref 26.0–34.0)
MCHC: 33 g/dL (ref 30.0–36.0)
MCV: 96.5 fL (ref 78.0–100.0)
Monocytes Absolute: 0.8 10*3/uL (ref 0.1–1.0)
Monocytes Relative: 10 %
Neutro Abs: 5.3 10*3/uL (ref 1.7–7.7)
Neutrophils Relative %: 69 %
Platelets: 221 10*3/uL (ref 150–400)
RBC: 3.14 MIL/uL — ABNORMAL LOW (ref 3.87–5.11)
RDW: 12.8 % (ref 11.5–15.5)
WBC: 7.7 10*3/uL (ref 4.0–10.5)

## 2016-02-23 LAB — GLUCOSE, CAPILLARY
Glucose-Capillary: 246 mg/dL — ABNORMAL HIGH (ref 65–99)
Glucose-Capillary: 340 mg/dL — ABNORMAL HIGH (ref 65–99)

## 2016-02-23 MED ORDER — ENOXAPARIN SODIUM 40 MG/0.4ML ~~LOC~~ SOLN
40.0000 mg | SUBCUTANEOUS | Status: DC
  Administered 2016-02-23: 40 mg via SUBCUTANEOUS
  Filled 2016-02-23: qty 0.4

## 2016-02-23 MED ORDER — QUINAPRIL-HYDROCHLOROTHIAZIDE 20-12.5 MG PO TABS: 1.0000 | ORAL_TABLET | Freq: Every day | ORAL | Status: DC

## 2016-02-23 MED ORDER — LISINOPRIL 20 MG PO TABS
20.0000 mg | ORAL_TABLET | Freq: Every day | ORAL | Status: DC
  Administered 2016-02-24: 20 mg via ORAL
  Filled 2016-02-23: qty 1

## 2016-02-23 MED ORDER — DM-GUAIFENESIN ER 30-600 MG PO TB12
1.0000 | ORAL_TABLET | Freq: Two times a day (BID) | ORAL | Status: DC
  Administered 2016-02-23 – 2016-02-24 (×2): 1 via ORAL
  Filled 2016-02-23 (×2): qty 1

## 2016-02-23 MED ORDER — INSULIN ASPART 100 UNIT/ML ~~LOC~~ SOLN
0.0000 [IU] | Freq: Three times a day (TID) | SUBCUTANEOUS | Status: DC
  Administered 2016-02-24: 3 [IU] via SUBCUTANEOUS

## 2016-02-23 MED ORDER — PROPRANOLOL HCL 20 MG PO TABS
20.0000 mg | ORAL_TABLET | Freq: Two times a day (BID) | ORAL | Status: DC
Start: 2016-02-23 — End: 2016-02-24
  Administered 2016-02-23 – 2016-02-24 (×2): 20 mg via ORAL
  Filled 2016-02-23 (×2): qty 1

## 2016-02-23 MED ORDER — MAGNESIUM SULFATE IN D5W 1-5 GM/100ML-% IV SOLN
1.0000 g | Freq: Once | INTRAVENOUS | Status: AC
  Administered 2016-02-23: 1 g via INTRAVENOUS
  Filled 2016-02-23: qty 100

## 2016-02-23 MED ORDER — SODIUM CHLORIDE 0.9 % IV BOLUS (SEPSIS)
1000.0000 mL | Freq: Once | INTRAVENOUS | Status: AC
  Administered 2016-02-23: 1000 mL via INTRAVENOUS

## 2016-02-23 MED ORDER — PHENOL 1.4 % MT LIQD
1.0000 | OROMUCOSAL | Status: DC | PRN
  Administered 2016-02-24: 1 via OROMUCOSAL
  Filled 2016-02-23: qty 177

## 2016-02-23 MED ORDER — OSELTAMIVIR PHOSPHATE 75 MG PO CAPS
75.0000 mg | ORAL_CAPSULE | Freq: Two times a day (BID) | ORAL | Status: DC
  Administered 2016-02-23 – 2016-02-24 (×2): 75 mg via ORAL
  Filled 2016-02-23 (×2): qty 1

## 2016-02-23 MED ORDER — ATORVASTATIN CALCIUM 20 MG PO TABS
20.0000 mg | ORAL_TABLET | Freq: Every day | ORAL | Status: DC
  Administered 2016-02-24: 20 mg via ORAL
  Filled 2016-02-23: qty 1

## 2016-02-23 MED ORDER — POLYETHYLENE GLYCOL 3350 17 G PO PACK: 17.0000 g | PACK | Freq: Every day | ORAL | Status: DC | PRN

## 2016-02-23 MED ORDER — INSULIN ASPART 100 UNIT/ML ~~LOC~~ SOLN
0.0000 [IU] | Freq: Every day | SUBCUTANEOUS | Status: DC
Start: 2016-02-23 — End: 2016-02-24
  Administered 2016-02-23: 4 [IU] via SUBCUTANEOUS

## 2016-02-23 MED ORDER — HYDROCHLOROTHIAZIDE 12.5 MG PO CAPS
12.5000 mg | ORAL_CAPSULE | Freq: Every day | ORAL | Status: DC
  Administered 2016-02-24: 12.5 mg via ORAL
  Filled 2016-02-23: qty 1

## 2016-02-23 MED ORDER — ACETAMINOPHEN 325 MG PO TABS
650.0000 mg | ORAL_TABLET | Freq: Four times a day (QID) | ORAL | Status: DC | PRN
  Administered 2016-02-23 – 2016-02-24 (×2): 650 mg via ORAL
  Filled 2016-02-23 (×2): qty 2

## 2016-02-23 MED ORDER — ALBUTEROL SULFATE (2.5 MG/3ML) 0.083% IN NEBU
2.5000 mg | INHALATION_SOLUTION | Freq: Four times a day (QID) | RESPIRATORY_TRACT | Status: DC
  Administered 2016-02-23: 2.5 mg via RESPIRATORY_TRACT
  Filled 2016-02-23: qty 3

## 2016-02-23 MED ORDER — ALBUTEROL SULFATE (2.5 MG/3ML) 0.083% IN NEBU: 2.5000 mg | INHALATION_SOLUTION | Freq: Four times a day (QID) | RESPIRATORY_TRACT | Status: DC | PRN

## 2016-02-23 MED ORDER — SODIUM CHLORIDE 0.9 % IV SOLN
INTRAVENOUS | Status: AC
  Administered 2016-02-23: 18:00:00 via INTRAVENOUS

## 2016-02-23 MED ORDER — PROMETHAZINE HCL 25 MG PO TABS
12.5000 mg | ORAL_TABLET | Freq: Four times a day (QID) | ORAL | Status: DC | PRN
  Administered 2016-02-23: 12.5 mg via ORAL
  Filled 2016-02-23: qty 1

## 2016-02-23 MED ORDER — NAPROXEN 250 MG PO TABS
500.0000 mg | ORAL_TABLET | Freq: Two times a day (BID) | ORAL | Status: DC
  Administered 2016-02-23 – 2016-02-24 (×2): 500 mg via ORAL
  Filled 2016-02-23 (×2): qty 2

## 2016-02-23 MED ORDER — ACETAMINOPHEN 650 MG RE SUPP: 650.0000 mg | Freq: Four times a day (QID) | RECTAL | Status: DC | PRN

## 2016-02-23 NOTE — Progress Notes (Signed)
[name] is a 51 y.o. female patient direct admit awake, alert - oriented  X 4 - no acute distress noted.  VSS,  IV placed upon arrival to floor, and infusion started, occlusive dsg intact without redness. Pt c/o nausea and had an episode of vomiting upon arrival to floor.  Orientation to room, and floor completed with information packet given to patient/family.  Patient declined safety video at this time.  Admission INP armband ID verified with patient/family, and placed on pt.   SR up x 2, fall assessment complete, with patient and family able to verbalize understanding of risk associated with falls, and verbalized understanding to call nsg before up out of bed.  Call light within reach, patient able to voice, and demonstrate understanding.  Skin, clean-dry- intact without evidence of bruising, or skin tears.   No evidence of skin break down noted on exam.     Will cont to eval and treat per MD orders.  Milas Hock, RN 02/23/2016 7:41 PM

## 2016-02-23 NOTE — Progress Notes (Signed)
   CC: Flu HPI: [name] is a 51 y.o. female with a h/o of essential hypertension, left bundle branch block, diabetes and hyperlipidemia who presents with cough, fever, fatigue, malaise, nausea and vomiting that started on Monday.    Physical Exam: Vitals:   02/23/16 1525 02/23/16 1550  BP: (!) 175/91 (!) 155/75  Pulse: 89   Temp: (!) 102.9 F (39.4 C)   TempSrc: Oral   SpO2: 97%   Weight: 221 lb 6.4 oz (100.4 kg)    BP (!) 155/75   Pulse 89   Temp (!) 102.9 F (39.4 C) (Oral)   Wt 221 lb 6.4 oz (100.4 kg)   SpO2 97%   BMI 43.24 kg/m  General appearance: Slumped over in bed. Unable to hold her head up. Appears sick Lungs: Pulmonary examination difficult secondary to patient coughing. Heart: Tachycardic on auscultation but no murmurs rubs or gallops appreciated Abdomen: soft, non-tender; bowel sounds normal; no masses,  no organomegaly Extremities: extremities normal, atraumatic, no cyanosis or edema Patient appears dry on examination. She is tachycardic. Refill is delayed. She appears acutely ill.  Assessment & Plan:  See encounters tab for problem based medical decision making. Patient discussed with Dr. Daryll Drown  Signed: Ophelia Shoulder, MD 02/23/2016, 4:16 PM  Pager: [phone]

## 2016-02-23 NOTE — Assessment & Plan Note (Addendum)
Patient presents with fever, myalgia, chills, nausea, vomiting, cough which started on Monday. Patient has been febrile at home to 101. She was seen in the emergency department earlier this week and diagnosed with the flu. At that time chest x-ray did not reveal consolidation. She was prescribed roseltamivir and has been unable to finish the medication secondary to nausea and vomiting. She reports multiple episodes of nonbloody nonbilious emesis. Denies diarrhea or constipation. Does endorse chills and intermittent sweats. In clinic today the patient is tachycardic in the low 100s and is febrile to 102.9. She states that she is having difficulty keeping fluids down but has been able to take the majority of her maintenance medications including propranolol. Given that the patient is on a beta blocker and remains tachycardic this is concerning that she may be extremely volume down. She is also continuing to be febrile and appears extremely fatigued. At this time given that the patient was tachycardic and febrile while on a beta blocker I think it is reasonable to admit the patient for IV fluids and symptom management overnight. We will admit the patient to a general medical/surgical floor under observation and treat with IV fluids and supportive management for influenza. Once inpatient I think she would benefit from CBC, CMP and a lactic acid level. Additionally, she would benefit from nausea control and I would suggest using promethazine as she has a prolonged QTC which may make using Zofran less likely. Additionally, I think the patient would benefit from an anti-tussive medication to symptomatically control her cough. She will also need several liters of IV fluids. I discussed the possibility of an inpatient mission with the patient and she was amenable to this. -- Inpatient admission

## 2016-02-23 NOTE — H&P (Signed)
Date: 02/23/2016               Patient Name:  Sandra [name] MRN: [id number]  DOB: 1965-04-03 Age / Sex: 51 y.o., female   PCP: Ophelia Shoulder, MD         Medical Service: Internal Medicine Teaching Service         Attending Physician: Dr. Oval Linsey, MD    First Contact: Dr. Reesa Chew Pager: [phone]  Second Contact: Dr. Posey Pronto Pager: [phone]       After Hours (After 5p/  First Contact Pager: [phone]  weekends / holidays): Second Contact Pager: [phone]   Chief Complaint: Fever, cough, nasal congestion and generalized body aches.  History of Present Illness: [name] is a 51 y.o. female with a h/o of essential hypertension, left bundle branch block, diabetes and hyperlipidemia who presents with cough, sore throat, fever, fatigue, malaise, nausea and vomiting that started on Monday. She went to ED on Tuesday, where she found to have mild leukocytosis, mild lactic acidosis which resolved with IV fluid, rapid streps screen was negative, group A strep culture results are still pending, blood culture without any growth after 2 days and normal UA. She was given some IV fluid and was discharged home on Tamiflu.  Her symptoms continued to get worse, still having fever up to 102 despite of using ibuprofen and Tylenol. There is mild improvement in nasal congestion, her cough gets worse with production of yellowish sputum. She also complained of headaches but denies any change in vision or facial pain. She also complain of shortness of breath with mild exertion, denies any chest pain, palpitations or diaphoresis. She do complain of nausea and vomiting, loss of appetite, but denies any abdominal pain or current diarrhea. She said she had diarrhea about 1 week ago which lasted for one day, she did not had any bowel movements since then. She denies any urinary symptoms.  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 She did not get flu shot.  She was seen in clinic  today for these worsening symptoms and found to have fever of 102.9. Looks very lethargic and dehydrated. She was admitted for fluid resuscitation and further management.  Meds:  Current Meds  Medication Sig  . acetaminophen (TYLENOL) 500 MG tablet Take 1,000 mg by mouth every 6 (six) hours as needed for moderate pain or headache.  . albuterol (PROVENTIL HFA;VENTOLIN HFA) 108 (90 Base) MCG/ACT inhaler Inhale 1-2 puffs into the lungs every 6 (six) hours as needed for wheezing or shortness of breath.  Marland Kitchen atorvastatin (LIPITOR) 20 MG tablet Take 1 tablet (20 mg total) by mouth daily at 6 PM.  . eletriptan (RELPAX) 20 MG tablet Take 1 tablet (20 mg total) by mouth as needed for migraine or headache. May repeat in 2 hours if headache persists or recurs.  Marland Kitchen glipiZIDE (GLUCOTROL) 10 MG tablet Take 1 tablet (10 mg total) by mouth 2 (two) times daily before a meal.  . ibuprofen (ADVIL,MOTRIN) 600 MG tablet Take 1 tablet (600 mg total) by mouth every 6 (six) hours as needed.  . metFORMIN (GLUCOPHAGE XR) 500 MG 24 hr tablet Take 1 tablet (500 mg total) by mouth daily with breakfast. (Patient taking differently: Take 1,000 mg by mouth 2 (two) times daily. )  . oseltamivir (TAMIFLU) 75 MG capsule Take 1 capsule (75 mg total) by mouth every 12 (twelve) hours.  . propranolol (INDERAL) 20 MG tablet Take 1  tablet (20 mg total) by mouth 2 (two) times daily.  . quinapril-hydrochlorothiazide (ACCURETIC) 20-12.5 MG tablet Take 1 tablet by mouth daily.  . sitaGLIPtin (JANUVIA) 100 MG tablet Take 1 tablet (100 mg total) by mouth daily. You must be seen to receive any additional medications     Allergies: Allergies as of 02/23/2016  . (No Known Allergies)   Past Medical History:  Diagnosis Date  . Asthma    as a child  . Carpal tunnel syndrome   . Diabetes mellitus type 2, uncontrolled (HCC) DX: 1999   Started as gestational diabetes  . ESSENTIAL HYPERTENSION 07/08/2006  . HSV (herpes simplex virus) anogenital  infection   . HYPERLIPIDEMIA 08/20/2007  . Menorrhagia   . Migraine    Normal CT head (08/06/2006)  . Swelling of abdominal wall 03/02/2014    Family History: Father has diabetes and hypertension, died because of a brain tumor at the age of 80.  Social History: Never smoked, occasionally drink wine. Denies any illicit drug use.  Review of Systems: A complete ROS was negative except as per HPI.   Physical Exam: BP (!) 155/75   Pulse 89   Temp (!) 102.9 F (39.4 C) (Oral)   Wt 221 lb 6.4 oz (100.4 kg)   SpO2 97%   BMI 43.24 kg/m   General: Vital signs reviewed.  Patient is well-developed and well-nourished, appears tired ,in no acute distress and cooperative with exam.  Head: Normocephalic and atraumatic. No erythema or edema of pharynx, no pharyngeal exudate. Eyes: EOMI, conjunctivae normal, no scleral icterus.  Neck: Supple, trachea midline, normal ROM, no JVD, masses, thyromegaly, or carotid bruit present.  Cardiovascular: Sinus tachycardia,no murmurs, gallops, or rubs. Pulmonary/Chest: Bilateral scattered expiratory wheezing. Abdominal: Soft, non-tender, non-distended, BS +, no masses, organomegaly, or guarding present.  Musculoskeletal: No joint deformities, erythema, or stiffness, ROM full and nontender. Extremities: No lower extremity edema bilaterally,  pulses symmetric and intact bilaterally. No cyanosis or clubbing. Neurological: A&O x3, Strength is normal and symmetric bilaterally, cranial nerve II-XII are grossly intact, no focal motor deficit, sensory intact to light touch bilaterally.  Skin: Warm, dry and intact. No rashes or erythema. Psychiatric: Normal mood and affect. speech and behavior is normal. Cognition and memory are normal.  EKG:   CXR:   Assessment & Plan by Problem: [name] is a 51 y.o. female with a h/o of essential hypertension, left bundle branch block, diabetes and hyperlipidemia who presents with cough, sore throat, fever, fatigue,  malaise, nausea and vomiting that started on Monday.  Flulike symptoms. She was never tested for influenza PCR during her visit to ED. She is at risk of flu being around sick contacts and never got the flu vaccine. She appears lethargic and mildly dehydrated. P. Admit to MedSurg. Check Flu RSV panel CBC BMP Lactic Acid Sputum culture. CXR ECG IVF Continue Tamiflu Tylenol for fever Mucinex Albuterol nebulizer  Type 2 diabetes. Her last A1c was in December 2016, which was 8.9. P. A1c -CBG -SSI  Hypertension. -Continue propranolol 20 mg twice daily. -We will add her home dose of Accuretic once BMP results are back.  Systolic heart failure and history of left bundle branch block. Her last ejection fraction noted through cardiac MRI was 46%, with septal and anterior wall hypokinesia.(According to a office visit note, cardiac MRI done in 2014)) She denies any chest pain. P. -ECG  Hyperlipidemia. -Continue home dose of Lipitor 20 mg daily at night.  Code Status: Full DVT:  Lovenox Diet: Heart healthy, card modified.  Dispo: Admit patient to Observation with expected length of stay less than 2 midnights.  Signed: Lorella Nimrod, MD 02/23/2016, 6:12 PM  Pager: [phone]

## 2016-02-23 NOTE — Progress Notes (Signed)
Date: 02/23/2016               Patient Name:  Sandra [name] MRN: [id number]  DOB: 1965-05-05 Age / Sex: 51 y.o., female   PCP: Ophelia Shoulder, MD         Medical Service: Internal Medicine Teaching Service         Attending Physician: Dr. Oval Linsey, MD    First Contact: Dr. Reesa Chew Pager: [phone]  Second Contact: Dr. Posey Pronto Pager: [phone]       After Hours (After 5p/  First Contact Pager: [phone]  weekends / holidays): Second Contact Pager: [phone]   Chief Complaint: Flu  History of Present Illness: Sandra [name] is a 51 y.o. female with a h/o of essential hypertension, asthma, left bundle branch block, diabetes and hyperlipidemia who presents with cough, fever, fatigue, nausea and vomiting. Patient started having these symptoms Monday (1/29) which progressively worsened over the course of the day. She then went to the ED on the morning of 1/30 where they told her she has the flu and discharged the patient on Tamiflu. Patient attempted to takeTamiflu, but reported increased nausea and vomiting on Tuesday after taking it and stopped taking it on Wednesday. Patient appeared in [facility], where she was seen by PCP who asked for her to be admitted to observation.  Patient reports having a bad cough since Monday along with a sore throat. Sore throat has been diminished today due to OTC medications. Her cough is productive and produces yellow mucus. She denies any blood in sputum. She reports new onset DOE and SOB, along with wheezing during the course of this illness.  Patient has felt nauseous and reports vomiting. She denies any blood in her vomit. N/V was worse on Tuesday after taking Tamiflu. Patient says that she had diarrhea the week before, but denies having any bowel movements this week.    Patient states that she has had a constant bad headache. She had muscle aches the past few days, but says that she does not have them today.   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 She did not receive a flu shot this year.   Meds:  No current facility-administered medications on file prior to encounter.    Current Outpatient Prescriptions on File Prior to Encounter  Medication Sig  . albuterol (PROVENTIL HFA;VENTOLIN HFA) 108 (90 Base) MCG/ACT inhaler Inhale 1-2 puffs into the lungs every 6 (six) hours as needed for wheezing or shortness of breath.  Marland Kitchen atorvastatin (LIPITOR) 20 MG tablet Take 1 tablet (20 mg total) by mouth daily at 6 PM.  . eletriptan (RELPAX) 20 MG tablet Take 1 tablet (20 mg total) by mouth as needed for migraine or headache. May repeat in 2 hours if headache persists or recurs.  Marland Kitchen glipiZIDE (GLUCOTROL) 10 MG tablet Take 1 tablet (10 mg total) by mouth 2 (two) times daily before a meal.  . glucose blood (FREESTYLE TEST STRIPS) test strip Use as instructed  . ibuprofen (ADVIL,MOTRIN) 600 MG tablet Take 1 tablet (600 mg total) by mouth every 6 (six) hours as needed.  . Lancets (FREESTYLE) lancets Use as instructed  . levocetirizine (XYZAL) 5 MG tablet Take 5 mg by mouth every evening.  . medroxyPROGESTERone (PROVERA) 10 MG tablet TAKE 2 TABLETS BY MOUTH EVERY DAY - CAN INCREASE TO TWO TABLETS TWICE A DAY FOR HEAVY BLEEDING (Patient not taking: Reported on 05/07/2014)  . metFORMIN (  GLUCOPHAGE XR) 500 MG 24 hr tablet Take 1 tablet (500 mg total) by mouth daily with breakfast.  . oseltamivir (TAMIFLU) 75 MG capsule Take 1 capsule (75 mg total) by mouth every 12 (twelve) hours.  . propranolol (INDERAL) 20 MG tablet Take 1 tablet (20 mg total) by mouth 2 (two) times daily.  . quinapril-hydrochlorothiazide (ACCURETIC) 20-12.5 MG tablet Take 1 tablet by mouth daily.  . sitaGLIPtin (JANUVIA) 100 MG tablet Take 1 tablet (100 mg total) by mouth daily. You must be seen to receive any additional medications    Allergies: Allergies as of 02/23/2016  . (No Known Allergies)   Past Medical History:  Diagnosis Date  . Asthma    as a child   . Carpal tunnel syndrome   . Diabetes mellitus type 2, uncontrolled (HCC) DX: 1999   Started as gestational diabetes  . ESSENTIAL HYPERTENSION 07/08/2006  . HSV (herpes simplex virus) anogenital infection   . HYPERLIPIDEMIA 08/20/2007  . Menorrhagia   . Migraine    Normal CT head (08/06/2006)  . Swelling of abdominal wall 03/02/2014    Family History:  Family History  Problem Relation Age of Onset  . Hypertension Father   . Diabetes Father     Social History:  Social History   Social History  . Marital status: Married    Spouse name: N/A  . Number of children: N/A  . Years of education: N/A   Occupational History  . Daycare teacher    Social History Main Topics  . Smoking status: Never Smoker  . Smokeless tobacco: Never Used  . Alcohol use No  . Drug use: No  . Sexual activity: Yes    Birth control/ protection: Surgical   Other Topics Concern  . Not on file   Social History Narrative   Studying at [employer] in early childhood education.   Married.    Review of Systems: A complete ROS was negative except as per HPI.   Physical Exam: Vitals:   02/23/16 1525 02/23/16 1550  BP: (!) 175/91 (!) 155/75  Pulse: 89   Temp: (!) 102.9 F (39.4 C)   TempSrc: Oral   SpO2: 97%   Weight: 221 lb 6.4 oz (100.4 kg)    Physical Exam  Constitutional:  Patient looks ill. Sitting in wheelchair slumped over. Was able to converse, but tired appearing.   HENT:  Head: Normocephalic and atraumatic.  Cardiovascular:  Tachycardic on auscultation but no murmurs, rubs, or gallops.  Pulmonary/Chest:  Scattered wheezing on auscultation  Abdominal: Soft. Bowel sounds are normal. There is no tenderness.  Skin: Skin is warm and dry.   CBG (last 3)   Recent Labs  02/23/16 1641  GLUCAP 246*    CXR: 1/30 CLINICAL DATA:  Fever and cough since yesterday.  EXAM: CHEST  2 VIEW  COMPARISON:  PA and lateral chest 04/08/2015 and 12/15/2014.  FINDINGS: The lungs are clear.  Heart size is normal. No pneumothorax or pleural fluid. No acute bony abnormality.  IMPRESSION: No acute disease.  Assessment & Plan by Problem: Principal Problems:   Active Problems:   Flu    Flu Went to the ED on 1/30 where the patient was found to have a negative CXR, CBC with mild leukocytosis, hyperglycemia w/ glucose of 281, and mild lactic acidosis. Patient was discharged with Tamiflu and instructed to hydrate, rest, and use NSAIDS for muscle aches and Tylenol for fever. Symptoms and HPI suggestive of Flu, though Pneumonia is on the differential. Will place  patient in observation on droplet precaution. To investigate Flu, will order respiratory panel . For possible pneumonia, will order CXR to assess lungs. Will order sputum culture to grow any potential microbes. Given her high fever and ill appearance, if she does have pneumonia, symptoms are concerning for bacteremia and possible sepsis. Will order blood culture and lactic acid.  -- place on droplet precaution -- order CXR PA and lateral -- order BMP & CBC  -- order Lactic Acid -- order blood culture  -- order RVP and Influenza   Asthma Patient was found to have scattered wheezes on asucultation. Wheezes most likely provoked by flu, will treat with albuterol -- nebulized albuterol Q6H  DMT2 Last A1C on file was 8.9 (04/17/16). Patient has a history of poorly controlled type 2 diabetes mellitus. Most recent CBG is 246. Will monitor BG and will order SSI to control BG while inpatient.  -- order SSI -- monitor CBG -- hold home metformin and glipizide  HTN Patient was hypertensive in clinic today. Will continue home BP meds. -- cont home quinapril-hydrochlorothiazide -- cont home propanolol  HLD -- cont home atorvastatin  Migraines -- cont home eletriptan PRN -- cont home propanolol  Dispo: Admit patient to Observation with expected length of stay less than 2 midnights.  Signed: Graciela Husbands, Medical  Student 02/23/2016, 5:12 PM  Pager: [phone]

## 2016-02-24 DIAGNOSIS — Z794 Long term (current) use of insulin: Secondary | ICD-10-CM | POA: Diagnosis not present

## 2016-02-24 DIAGNOSIS — Z79899 Other long term (current) drug therapy: Secondary | ICD-10-CM | POA: Diagnosis not present

## 2016-02-24 DIAGNOSIS — J45901 Unspecified asthma with (acute) exacerbation: Secondary | ICD-10-CM | POA: Diagnosis not present

## 2016-02-24 DIAGNOSIS — E118 Type 2 diabetes mellitus with unspecified complications: Secondary | ICD-10-CM

## 2016-02-24 DIAGNOSIS — G43909 Migraine, unspecified, not intractable, without status migrainosus: Secondary | ICD-10-CM

## 2016-02-24 DIAGNOSIS — I5022 Chronic systolic (congestive) heart failure: Secondary | ICD-10-CM | POA: Diagnosis not present

## 2016-02-24 DIAGNOSIS — E785 Hyperlipidemia, unspecified: Secondary | ICD-10-CM | POA: Diagnosis not present

## 2016-02-24 DIAGNOSIS — I110 Hypertensive heart disease with heart failure: Secondary | ICD-10-CM | POA: Diagnosis not present

## 2016-02-24 DIAGNOSIS — J112 Influenza due to unidentified influenza virus with gastrointestinal manifestations: Secondary | ICD-10-CM | POA: Diagnosis not present

## 2016-02-24 DIAGNOSIS — J101 Influenza due to other identified influenza virus with other respiratory manifestations: Secondary | ICD-10-CM | POA: Diagnosis not present

## 2016-02-24 LAB — GLUCOSE, CAPILLARY: Glucose-Capillary: 256 mg/dL — ABNORMAL HIGH (ref 65–99)

## 2016-02-24 LAB — MAGNESIUM: Magnesium: 1.7 mg/dL (ref 1.7–2.4)

## 2016-02-24 LAB — CULTURE, GROUP A STREP (THRC)

## 2016-02-24 LAB — EXPECTORATED SPUTUM ASSESSMENT W GRAM STAIN, RFLX TO RESP C

## 2016-02-24 LAB — HEMOGLOBIN A1C
Hgb A1c MFr Bld: 11.6 % — ABNORMAL HIGH (ref 4.8–5.6)
Mean Plasma Glucose: 286 mg/dL

## 2016-02-24 LAB — EXPECTORATED SPUTUM ASSESSMENT W REFEX TO RESP CULTURE

## 2016-02-24 MED ORDER — PROMETHAZINE HCL 12.5 MG PO TABS: 12.5000 mg | ORAL_TABLET | Freq: Four times a day (QID) | ORAL | 0 refills | Status: DC | PRN

## 2016-02-24 MED ORDER — MAGNESIUM SULFATE 2 GM/50ML IV SOLN
2.0000 g | Freq: Once | INTRAVENOUS | Status: DC
  Filled 2016-02-24: qty 50

## 2016-02-24 MED ORDER — DM-GUAIFENESIN ER 30-600 MG PO TB12: 1.0000 | ORAL_TABLET | Freq: Two times a day (BID) | ORAL | 0 refills | Status: DC

## 2016-02-24 NOTE — Progress Notes (Signed)
   Subjective: Patient feels better this morning. Fever, nausea, and vomiting resolved but still is having cough. She is in agreement on going home today.    Objective:  Vital signs in last 24 hours: Vitals:   02/23/16 2045 02/23/16 2121 02/24/16 0537  BP: (!) 141/77  (!) 124/59  Pulse: (!) 101 95 85  Resp: 18 18 18   Temp: 98.5 F (36.9 C)  98.2 F (36.8 C)  TempSrc: Oral  Oral  SpO2: 95% 96% 97%   Physical Exam  Constitutional: She appears well-developed and well-nourished. No distress.  Cardiovascular: Normal rate, regular rhythm and normal heart sounds.  Exam reveals no gallop and no friction rub.   No murmur heard. Pulmonary/Chest: Effort normal and breath sounds normal. She has no wheezes.  Abdominal: Soft. Bowel sounds are normal. She exhibits no distension. There is no tenderness.   CBC Latest Ref Rng & Units 02/23/2016 02/21/2016 04/08/2015  WBC 4.0 - 10.5 K/uL 7.7 10.9(H) 7.6  Hemoglobin 12.0 - 15.0 g/dL 10.0(L) 11.3(L) 11.3(L)  Hematocrit 36.0 - 46.0 % 30.3(L) 33.6(L) 34.1(L)  Platelets 150 - 400 K/uL 221 277 326   BMP Latest Ref Rng & Units 02/23/2016 02/21/2016 04/08/2015  Glucose 65 - 99 mg/dL 298(H) 281(H) 230(H)  BUN 6 - 20 mg/dL 7 9 10   Creatinine 0.44 - 1.00 mg/dL 0.80 0.73 0.73  BUN/Creat Ratio 9 - 23 - - -  Sodium 135 - 145 mmol/L 138 138 138  Potassium 3.5 - 5.1 mmol/L 3.7 3.9 4.1  Chloride 101 - 111 mmol/L 102 108 102  CO2 22 - 32 mmol/L 25 23 23   Calcium 8.9 - 10.3 mg/dL 8.7(L) 9.0 9.6   Lactic Acid, Venous    Component Value Date/Time   LATICACIDVEN 1.3 02/23/2016 2133   Influenza PCR: Positive for Influenza A  CXR: Negative for any consolidation. Mild cardiomegaly  Assessment/Plan:  Active Problems:   Flu  51 y.o. female with a h/o of essential hypertension, left bundle branch block, HFrEF, diabetes and hyperlipidemiawho presents with cough,sore throat,fever, fatigue, malaise, nausea and vomiting that started on Monday.  Flu Patient was  positive for Influenza A on Influenza PCR. Patient has remained afebrile and nausea and vomiting are improving. CXR was negative for consolidation.  Patient is in agreement about going home today. -- D/C today    -- cont home Tamiflu. Complete 5 day course -- cont Mucinex -- Tylenol PRN for body aches and fever   Asthma  Exacerbated most likely due to Flu. She had no wheezing this morning on physical exam.  -- cont home Albuterol inhaler PRN  T2DM Uncontrolled diabetes as her A1c done yesterday was 11.6. -- f/u with PCP to adjust regimen outpatient  HTN She remained normotensive during most of her stay. -- cont home Lisinopril  -- cont home Hydrochlorothiazide.  HFrEF & LBBB Patient denies any chest pain and does not look volume overloaded.  Will f/u with PCP outpatient to work up.  -- f/u with PCP  HLD -- cont home Atorvastatin  Migraines -- cont home Inderal and Relpax.  Code Status: FULL  Dispo: Anticipated discharge today   Graciela Husbands, Medical Student 02/24/2016, 1:44 PM Pager: [phone]

## 2016-02-24 NOTE — Discharge Summary (Signed)
Name: Sandra [name] MRN: [id number] DOB: [birth year] 51 y.o. PCP: Ophelia Shoulder, MD  Date of Admission: 02/23/2016  5:27 PM Date of Discharge: 02/26/2016 Attending Physician: No att. providers found  Discharge Diagnosis: 1. FLU   Discharge Medications: Allergies as of 02/24/2016   No Known Allergies     Medication List    STOP taking these medications   medroxyPROGESTERone 10 MG tablet Commonly known as:  PROVERA     TAKE these medications   acetaminophen 500 MG tablet Commonly known as:  TYLENOL Take 1,000 mg by mouth every 6 (six) hours as needed for moderate pain or headache.   albuterol 108 (90 Base) MCG/ACT inhaler Commonly known as:  PROVENTIL HFA;VENTOLIN HFA Inhale 1-2 puffs into the lungs every 6 (six) hours as needed for wheezing or shortness of breath.   atorvastatin 20 MG tablet Commonly known as:  LIPITOR Take 1 tablet (20 mg total) by mouth daily at 6 PM.   dextromethorphan-guaiFENesin 30-600 MG 12hr tablet Commonly known as:  MUCINEX DM Take 1 tablet by mouth 2 (two) times daily.   eletriptan 20 MG tablet Commonly known as:  RELPAX Take 1 tablet (20 mg total) by mouth as needed for migraine or headache. May repeat in 2 hours if headache persists or recurs.   glipiZIDE 10 MG tablet Commonly known as:  GLUCOTROL Take 1 tablet (10 mg total) by mouth 2 (two) times daily before a meal.   ibuprofen 600 MG tablet Commonly known as:  ADVIL,MOTRIN Take 1 tablet (600 mg total) by mouth every 6 (six) hours as needed.   levocetirizine 5 MG tablet Commonly known as:  XYZAL Take 5 mg by mouth every evening.   metFORMIN 500 MG 24 hr tablet Commonly known as:  GLUCOPHAGE XR Take 1 tablet (500 mg total) by mouth daily with breakfast. What changed:  how much to take  when to take this   oseltamivir 75 MG capsule Commonly known as:  TAMIFLU Take 1 capsule (75 mg total) by mouth every 12 (twelve) hours.   promethazine 12.5 MG tablet Commonly known as:   PHENERGAN Take 1 tablet (12.5 mg total) by mouth every 6 (six) hours as needed for nausea.   propranolol 20 MG tablet Commonly known as:  INDERAL Take 1 tablet (20 mg total) by mouth 2 (two) times daily.   quinapril-hydrochlorothiazide 20-12.5 MG tablet Commonly known as:  ACCURETIC Take 1 tablet by mouth daily.   sitaGLIPtin 100 MG tablet Commonly known as:  JANUVIA Take 1 tablet (100 mg total) by mouth daily. You must be seen to receive any additional medications       Disposition and follow-up:   [name] was discharged from [facility] in Good condition.  At the hospital follow up visit please address:  1. Her increased A1c and adjust her medicines accordingly. She needs F/U of her systolic HF, currently no symptoms. Please address her compliance with medical treatment.  2.  Labs / imaging needed at time of follow-up: CBG  3.  Pending labs/ test needing follow-up: None  Follow-up Appointments: Follow-up Information    Ophelia Shoulder, MD Follow up in 2 week(s).   Specialty:  Internal Medicine Contact information: [place] [id number] [place] Hospital Course by problem list:  Ms.Sandra A Gardinis a 51 y.o.femalewith a h/o of essential hypertension, left bundle branch block, diabetes and hyperlipidemiawho presents with cough,sore throat,fever, fatigue,  malaise, nausea and vomiting that started on Monday.  1. FLU. She was seen in ED on Tuesday for same complaints, Treated with IVF and discharged home on Tamiflu.Her symptoms continued to get worse, she was unable to keep Tamiflu down because of nausea and vomiting. On Thursday, presented to [facility] and found to have fever of 102.9,lethargic and dehydrated. Admitted for failed out-patient treatment,Found to have Influenza A Positive. She was treated with gentle IVF and Tamiflu with some antiemetics.Next morning she was feeling much better, became afibrile and  wants to go home. She was discharged with Tamiflu and antiemetics.  2-Asthma exacerbation. Most likely due to Flu.She was not having any wheezing on discharge. -She can continue her home Albuterol inhaler when needed.  Type 2 diabetes. Uncontrolled diabetes as her A1c done on 02/23/16 was 11.6. -She needs adjustment in her diabetic medicine as an outpatient.  Hypertension. She remained normotensive during most of her stay. -We Continued her home dose of lisinopril and hydrochlorothiazide.  Systolic heart failure and history of left bundle branch block. No current chest pain and she does not look volume overload during this admission. -She will need follow-up as an outpatient.  Hyperlipidemia. -We Continued home dose of Lipitor 20 mg daily at night.  History of migraines. She can continue her home dose of Inderal and Relpax.  Discharge Vitals:   BP (!) 124/59 (BP Location: Left Arm)   Pulse 85   Temp 98.2 F (36.8 C) (Oral)   Resp 18   SpO2 97%   Gen. Well-developed, well-nourished lady, in no acute distress. Chest. Clear bilaterally. CVS. Regular rate and rhythm, no drops/murmurs/gallop. Abdomen. Soft, nontender, nondistended, bowel sounds positive. Extremities. No edema, no cyanosis, pulses 2+ bilaterally.  Pertinent Labs, Studies, and Procedures:  Basic Metabolic Panel:  Recent Labs  02/23/16 1841 02/24/16 0750  NA 138  --   K 3.7  --   CL 102  --   CO2 25  --   GLUCOSE 298*  --   BUN 7  --   CREATININE 0.80  --   CALCIUM 8.7*  --   MG 1.4* 1.7   CBC:  Recent Labs  02/23/16 1841  WBC 7.7  NEUTROABS 5.3  HGB 10.0*  HCT 30.3*  MCV 96.5  PLT 221   CBG:  Recent Labs  02/23/16 1641 02/23/16 2143 02/24/16 0803  GLUCAP 246* 340* 256*   Hemoglobin A1C:  Recent Labs  02/23/16 1842  HGBA1C 11.6*   Misc. Labs:  Influenza A positive Influenza B negative  Imaging results:  X-ray Chest Pa And Lateral  Result Date:  02/23/2016 CLINICAL DATA:  Chills body aches and fever for 4 days. EXAM: CHEST  2 VIEW COMPARISON:  02/21/2016 FINDINGS: Unchanged mild cardiomegaly. The lungs are clear. The pulmonary vasculature is normal. There is no pleural effusion. Hilar and mediastinal contours are unremarkable and unchanged. IMPRESSION: Stable cardiomegaly.  No acute findings. Electronically Signed   By: Andreas Newport M.D.   On: 02/23/2016 21:09     Discharge Instructions: Discharge Instructions    Call MD for:  difficulty breathing, headache or visual disturbances    Complete by:  As directed    Call MD for:  persistant dizziness or light-headedness    Complete by:  As directed    Call MD for:  persistant nausea and vomiting    Complete by:  As directed    Call MD for:  severe uncontrolled pain    Complete by:  As directed  Call MD for:  temperature >100.4    Complete by:  As directed    Diet - low sodium heart healthy    Complete by:  As directed    Increase activity slowly    Complete by:  As directed       Signed: Lorella Nimrod, MD 02/26/2016, 10:58 AM   Pager: [phone]

## 2016-02-24 NOTE — Discharge Instructions (Signed)
[name], Sandra [name] were hospitalized for the flu (Influenza). Please complete your Tamiflu and continue to try to stay hydrated. Continue supportive care for your cough and nausea. Please follow up in the Internal Medicine Clinic in the next 2-3 weeks or sooner if needed.    Influenza, Adult Influenza, more commonly known as the flu, is a viral infection that primarily affects the respiratory tract. The respiratory tract includes organs that help you breathe, such as the lungs, nose, and throat. The flu causes many common cold symptoms, as well as a high fever and body aches. The flu spreads easily from person to person (is contagious). Getting a flu shot (influenza vaccination) every year is the best way to prevent influenza. What are the causes? Influenza is caused by a virus. You can catch the virus by:  Breathing in droplets from an infected person's cough or sneeze.  Touching something that was recently contaminated with the virus and then touching your mouth, nose, or eyes. What increases the risk? The following factors may make you more likely to get the flu:  Not cleaning your hands frequently with soap and water or alcohol-based hand sanitizer.  Having close contact with many people during cold and flu season.  Touching your mouth, eyes, or nose without washing or sanitizing your hands first.  Not drinking enough fluids or not eating a healthy diet.  Not getting enough sleep or exercise.  Being under a high amount of stress.  Not getting a yearly (annual) flu shot. You may be at a higher risk of complications from the flu, such as a severe lung infection (pneumonia), if you:  Are over the age of 53.  Are pregnant.  Have a weakened disease-fighting system (immune system). You may have a weakened immune system if you:  Have HIV or AIDS.  Are undergoing chemotherapy.  Aretaking medicines that reduce the activity of (suppress) the immune system.  Have a long-term  (chronic) illness, such as heart disease, kidney disease, diabetes, or lung disease.  Have a liver disorder.  Are obese.  Have anemia. What are the signs or symptoms? Symptoms of this condition typically last 4-10 days and may include:  Fever.  Chills.  Headache, body aches, or muscle aches.  Sore throat.  Cough.  Runny or congested nose.  Chest discomfort and cough.  Poor appetite.  Weakness or tiredness (fatigue).  Dizziness.  Nausea or vomiting. How is this diagnosed? This condition may be diagnosed based on your medical history and a physical exam. Your health care provider may do a nose or throat swab test to confirm the diagnosis. How is this treated? If influenza is detected early, you can be treated with antiviral medicine that can reduce the length of your illness and the severity of your symptoms. This medicine may be given by mouth (orally) or through an IV tube that is inserted in one of your veins. The goal of treatment is to relieve symptoms by taking care of yourself at home. This may include taking over-the-counter medicines, drinking plenty of fluids, and adding humidity to the air in your home. In some cases, influenza goes away on its own. Severe influenza or complications from influenza may be treated in a hospital. Follow these instructions at home:  Take over-the-counter and prescription medicines only as told by your health care provider.  Use a cool mist humidifier to add humidity to the air in your home. This can make breathing easier.  Rest as needed.  Drink enough  fluid to keep your urine clear or pale yellow.  Cover your mouth and nose when you cough or sneeze.  Wash your hands with soap and water often, especially after you cough or sneeze. If soap and water are not available, use hand sanitizer.  Stay home from work or school as told by your health care provider. Unless you are visiting your health care provider, try to avoid leaving  home until your fever has been gone for 24 hours without the use of medicine.  Keep all follow-up visits as told by your health care provider. This is important. How is this prevented?  Getting an annual flu shot is the best way to avoid getting the flu. You may get the flu shot in late summer, fall, or winter. Ask your health care provider when you should get your flu shot.  Wash your hands often or use hand sanitizer often.  Avoid contact with people who are sick during cold and flu season.  Eat a healthy diet, drink plenty of fluids, get enough sleep, and exercise regularly. Contact a health care provider if:  You develop new symptoms.  You have:  Chest pain.  Diarrhea.  A fever.  Your cough gets worse.  You produce more mucus.  You feel nauseous or you vomit. Get help right away if:  You develop shortness of breath or difficulty breathing.  Your skin or nails turn a bluish color.  You have severe pain or stiffness in your neck.  You develop a sudden headache or sudden pain in your face or ear.  You cannot stop vomiting. This information is not intended to replace advice given to you by your health care provider. Make sure you discuss any questions you have with your health care provider. Document Released: 01/06/2000 Document Revised: 06/16/2015 Document Reviewed: 11/02/2014 Elsevier Interactive Patient Education  2017 [employer].

## 2016-02-24 NOTE — Progress Notes (Signed)
NURSING PROGRESS NOTE  Sandra STANTZ [id number] Discharge Data: 02/24/2016 1:46 PM Attending Provider: Oval Linsey, MD [id number] Lovena Le, MD   [name] to be D/C'd Home per MD order.    All IV's will be discontinued and monitored for bleeding.  All belongings will be returned to patient for patient to take home.  Last Documented Vital Signs:  Blood pressure (!) 124/59, pulse 85, temperature 98.2 F (36.8 C), temperature source Oral, resp. rate 18, SpO2 97 %.  Joslyn Hy, MSN, RN, [employer]

## 2016-02-24 NOTE — Progress Notes (Signed)
   Subjective: Patient was feeling better when seen this morning. Her fever and nausea and vomiting has resolved. Still complaining of cough. She was asking for work note-which was provided.  Objective:  Vital signs in last 24 hours: Vitals:   02/23/16 2045 02/23/16 2121 02/24/16 0537  BP: (!) 141/77  (!) 124/59  Pulse: (!) 101 95 85  Resp: 18 18 18   Temp: 98.5 F (36.9 C)  98.2 F (36.8 C)  TempSrc: Oral  Oral  SpO2: 95% 96% 97%   Gen. Well-developed, well-nourished lady, in no acute distress. Chest. Clear bilaterally. CVS. Regular rate and rhythm, no drops/murmurs/gallop. Abdomen. Soft, nontender, nondistended, bowel sounds positive. Extremities. No edema, no cyanosis, pulses 2+ bilaterally.  Labs. CBC Latest Ref Rng & Units 02/23/2016 02/21/2016 04/08/2015  WBC 4.0 - 10.5 K/uL 7.7 10.9(H) 7.6  Hemoglobin 12.0 - 15.0 g/dL 10.0(L) 11.3(L) 11.3(L)  Hematocrit 36.0 - 46.0 % 30.3(L) 33.6(L) 34.1(L)  Platelets 150 - 400 K/uL 221 277 326   BMP Latest Ref Rng & Units 02/23/2016 02/21/2016 04/08/2015  Glucose 65 - 99 mg/dL 298(H) 281(H) 230(H)  BUN 6 - 20 mg/dL 7 9 10   Creatinine 0.44 - 1.00 mg/dL 0.80 0.73 0.73  BUN/Creat Ratio 9 - 23 - - -  Sodium 135 - 145 mmol/L 138 138 138  Potassium 3.5 - 5.1 mmol/L 3.7 3.9 4.1  Chloride 101 - 111 mmol/L 102 108 102  CO2 22 - 32 mmol/L 25 23 23   Calcium 8.9 - 10.3 mg/dL 8.7(L) 9.0 9.6   Influenza PCR. Positive for influenza A. LA. 1.1  1.3  CXR. Negative for any consolidation.  Assessment/Plan:  Sandra A Gardinis a 51 y.o.femalewith a h/o of essential hypertension, left bundle branch block, diabetes and hyperlipidemiawho presents with cough, sore throat, fever, fatigue, malaise, nausea and vomiting that started on Monday.  FLU. She is positive for influenza A. Remained afebrile with Tylenol and naproxen.Nausea, vomiting and generalized body aches are improving. Chest x-ray is negative for any infiltrate. -Continue Tamiflu. To  complete 5 day course. -Continue Mucinex. -Tylenol PRN or generalized body aches and fever. -She can be discharged on symptomatic treatment.  Asthma exacerbation. Most likely due to Flu.She was not having any wheezing this morning. -Albuterol inhaler when needed.  Type 2 diabetes. Uncontrolled diabetes as her A1c done yesterday was 11.6. -She needs adjustment in her diabetic medicine as an outpatient.  Hypertension. She remained normotensive during most of her stay. -Continue her home dose of lisinopril and hydrochlorothiazide.  Systolic heart failure and history of left bundle branch block. No current chest pain and she does not look volume overload clinically. -We will need follow-up as an outpatient.  Hyperlipidemia. -Continue home dose of Lipitor 20 mg daily at night.  History of migraines. She can continue her home dose of Inderal and Relpax.  Dispo: Being discharged today.  Lorella Nimrod, MD 02/24/2016, 9:04 AM Pager: [phone]

## 2016-02-24 NOTE — Progress Notes (Addendum)
Inpatient Diabetes Program Recommendations  AACE/ADA: New Consensus Statement on Inpatient Glycemic Control (2015)  Target Ranges:  Prepandial:   less than 140 mg/dL      Peak postprandial:   less than 180 mg/dL (1-2 hours)      Critically ill patients:  140 - 180 mg/dL   Lab Results  Component Value Date   GLUCAP 256 (H) 02/24/2016   HGBA1C 11.6 (H) 02/23/2016   Results for ASALIA, JESIONOWSKI (MRN [id number]) as of 02/24/2016 11:56  Ref. Range 02/23/2016 16:41 02/23/2016 21:43 02/24/2016 08:03  Glucose-Capillary Latest Ref Range: 65 - 99 mg/dL 246 (H) 340 (H) 256 (H)   Review of Glycemic Control  Diabetes history:     DM2, A1C increased from 8.9 in 12/2014 to 11.6 yesterday, Obesity Outpatient Diabetes medications:     Glipizide 10 mg BID,     Metformin 1000 mg BID,     Januvia 100 mg by mouth daily Current orders for Inpatient glycemic control:     Sensitive correction scale Novolog 0-9 units TIDAC and 0-5 units QHS  Inpatient Diabetes Program Recommendations:    Please consider Lantus 20 units daily.  Thank you,  Windy Carina, RN, MSN Diabetes Coordinator Inpatient Diabetes Program [phone] (Team Pager)

## 2016-02-26 LAB — CULTURE, BLOOD (ROUTINE X 2)
Culture: NO GROWTH
Culture: NO GROWTH

## 2016-02-26 LAB — RESPIRATORY PANEL BY PCR
Adenovirus: NOT DETECTED
Bordetella pertussis: NOT DETECTED
Chlamydophila pneumoniae: NOT DETECTED
Coronavirus 229E: NOT DETECTED
Coronavirus HKU1: NOT DETECTED
Coronavirus NL63: NOT DETECTED
Coronavirus OC43: NOT DETECTED
Influenza A H3: DETECTED — AB
Influenza B: NOT DETECTED
Metapneumovirus: NOT DETECTED
Mycoplasma pneumoniae: NOT DETECTED
Parainfluenza Virus 1: NOT DETECTED
Parainfluenza Virus 2: NOT DETECTED
Parainfluenza Virus 3: NOT DETECTED
Parainfluenza Virus 4: NOT DETECTED
Respiratory Syncytial Virus: NOT DETECTED
Rhinovirus / Enterovirus: NOT DETECTED

## 2016-02-26 LAB — CULTURE, RESPIRATORY W GRAM STAIN: Culture: NORMAL

## 2016-02-26 LAB — CULTURE, RESPIRATORY

## 2016-02-27 ENCOUNTER — Other Ambulatory Visit: Payer: Self-pay | Admitting: Internal Medicine

## 2016-02-28 ENCOUNTER — Other Ambulatory Visit: Payer: Self-pay | Admitting: Internal Medicine

## 2016-02-28 ENCOUNTER — Telehealth: Payer: Self-pay

## 2016-02-28 DIAGNOSIS — E669 Obesity, unspecified: Principal | ICD-10-CM

## 2016-02-28 DIAGNOSIS — E1169 Type 2 diabetes mellitus with other specified complication: Secondary | ICD-10-CM

## 2016-02-28 NOTE — Telephone Encounter (Signed)
Wanted to know if she should consider 99.5 oral temp as having a"fever", told her it would depend on her regular normal temp but normally no it is not considered having a fever

## 2016-02-28 NOTE — Telephone Encounter (Signed)
Pt needs to speak with a nurse about going back to work. Please call back.

## 2016-02-28 NOTE — Progress Notes (Signed)
Internal Medicine Clinic Attending  Case discussed with Dr. Taylor at the time of the visit.  We reviewed the resident's history and exam and pertinent patient test results.  I agree with the assessment, diagnosis, and plan of care documented in the resident's note. 

## 2016-03-01 ENCOUNTER — Telehealth: Payer: Self-pay

## 2016-03-01 NOTE — Telephone Encounter (Signed)
Thank you for arranging this.

## 2016-03-01 NOTE — Telephone Encounter (Signed)
Pt called, she states since this flu she has h/a  Daily and are as bad as migraines she has had in past, appt given per pt choice 2/9 at [address] [facility]

## 2016-03-01 NOTE — Telephone Encounter (Signed)
Needs to speak with a nurse regarding meds. Please call back.  

## 2016-03-02 ENCOUNTER — Encounter: Payer: Self-pay | Admitting: Internal Medicine

## 2016-03-02 ENCOUNTER — Ambulatory Visit (INDEPENDENT_AMBULATORY_CARE_PROVIDER_SITE_OTHER): Payer: BLUE CROSS/BLUE SHIELD | Admitting: Internal Medicine

## 2016-03-02 DIAGNOSIS — G43009 Migraine without aura, not intractable, without status migrainosus: Secondary | ICD-10-CM

## 2016-03-02 MED ORDER — ELETRIPTAN HYDROBROMIDE 20 MG PO TABS: ORAL_TABLET | ORAL | 0 refills | Status: DC

## 2016-03-02 NOTE — Patient Instructions (Addendum)
It was a pleasure meeting you today Ms. Comte,   For your migraines, I have given you an additional 10 tablet Elitriptan prescription for this month. You can continue to take the medications as 1 in the morning and an additional tablet a couple of hours later if needed  Please follow up with Dr. Lovena Le at your already scheduled appointment later this month   Migraine Headache A migraine headache is a very strong throbbing pain on one side or both sides of your head. Migraines can also cause other symptoms. Talk with your doctor about what things may bring on (trigger) your migraine headaches. Follow these instructions at home: Medicines  Take over-the-counter and prescription medicines only as told by your doctor.  Do not drive or use heavy machinery while taking prescription pain medicine.  To prevent or treat constipation while you are taking prescription pain medicine, your doctor may recommend that you:  Drink enough fluid to keep your pee (urine) clear or pale yellow.  Take over-the-counter or prescription medicines.  Eat foods that are high in fiber. These include fresh fruits and vegetables, whole grains, and beans.  Limit foods that are high in fat and processed sugars. These include fried and sweet foods. Lifestyle  Avoid alcohol.  Do not use any products that contain nicotine or tobacco, such as cigarettes and e-cigarettes. If you need help quitting, ask your doctor.  Get at least 8 hours of sleep every night.  Limit your stress. General instructions  Keep a journal to find out what may bring on your migraines. For example, write down:  What you eat and drink.  How much sleep you get.  Any change in what you eat or drink.  Any change in your medicines.  If you have a migraine:  Avoid things that make your symptoms worse, such as bright lights.  It may help to lie down in a dark, quiet room.  Do not drive or use heavy machinery.  Ask your doctor what  activities are safe for you.  Keep all follow-up visits as told by your doctor. This is important. Contact a doctor if:  You get a migraine that is different or worse than your usual migraines. Get help right away if:  Your migraine gets very bad.  You have a fever.  You have a stiff neck.  You have trouble seeing.  Your muscles feel weak or like you cannot control them.  You start to lose your balance a lot.  You start to have trouble walking.  You pass out (faint). This information is not intended to replace advice given to you by your health care provider. Make sure you discuss any questions you have with your health care provider. Document Released: 10/18/2007 Document Revised: 07/29/2015 Document Reviewed: 06/27/2015 Elsevier Interactive Patient Education  2017 [employer].

## 2016-03-02 NOTE — Progress Notes (Signed)
   CC: follow up of migraine headaches   HPI: Ms.Peni A Hatz is a 51 y.o. with past medical history as outlined below who presents to clinic for follow up of migraine headaches.  She typically experiences migraine headaches about twice per month but has noticed that her migraines have become more frequent since she was diagnosed with the flu. She gets 10 tablets of Elitriptan per month and was worried that she would run out of this medication before the end of the month. She has also been taking ibuprofen and tylenol. She has never tried any other medication for migraines.   Please see problem list for status of the pt's chronic medical problems.  Past Medical History:  Diagnosis Date  . Asthma    as a child  . Carpal tunnel syndrome   . Diabetes mellitus type 2, uncontrolled (HCC) DX: 1999   Started as gestational diabetes  . ESSENTIAL HYPERTENSION 07/08/2006  . HSV (herpes simplex virus) anogenital infection   . HYPERLIPIDEMIA 08/20/2007  . Menorrhagia   . Migraine    Normal CT head (08/06/2006)  . Swelling of abdominal wall 03/02/2014    Review of Systems:  Please see each problem below for a pertinent review of systems.  Physical Exam:  Vitals:   03/02/16 1528  BP: 126/65  Pulse: 87  Temp: 98.9 F (37.2 C)  TempSrc: Oral  SpO2: 100%  Weight: 216 lb 4.8 oz (98.1 kg)   Physical Exam  Constitutional: She is oriented to person, place, and time. She appears well-developed and well-nourished. No distress.  Cardiovascular: Normal rate and regular rhythm.   No murmur heard. Pulmonary/Chest: Effort normal and breath sounds normal. No respiratory distress.  Neurological: She is alert and oriented to person, place, and time. No cranial nerve deficit.  Skin: Skin is warm and dry. She is not diaphoretic.  Psychiatric: She has a normal mood and affect. Her behavior is normal.   Assessment & Plan:   See Encounters Tab for problem based charting.  Migraine  Discussed the  addition of prophylactic therapy with Topiramate. She feels that she would prefer to continue abortive therapy and does not want to have to take an additional daily medication. I given the usual frequency of her migraines. She has 9 pills left from this months rx and is worried that she will run out, she feels an additional 10 pills will be enough to get her through and agrees to notifying the clinic if her migraines continue at a frequency that she fills these will run out.  -prescribed elitriptan 20 mg #10  - continue ibuprofen, tylenol, and avoidance of stimuli   Patient discussed with Dr. Beryle Beams

## 2016-03-02 NOTE — Assessment & Plan Note (Signed)
She typically experiences migraine headaches about twice per month but has noticed that her migraines have become more frequent since she was diagnosed with the flu. She gets 10 tablets of Elitriptan per month and was worried that she would run out of this medication before the end of the month. She has also been taking ibuprofen and tylenol. She has never tried any other medication for migraines.   Discussed the addition of prophylactic therapy with Topiramate. She feels that she would prefer to continue abortive therapy and does not want to have to take an additional daily medication. I given the usual frequency of her migraines. She has 9 pills left from this months rx and is worried that she will run out, she feels an additional 10 pills will be enough to get her through and agrees to notifying the clinic if her migraines continue at a frequency that she fills these will run out.  -prescribed elitriptan 20 mg #10  - continue ibuprofen, tylenol, and avoidance of stimuli

## 2016-03-05 NOTE — Progress Notes (Signed)
Medicine attending: Medical history, presenting problems, physical findings, and medications, reviewed with resident physician Dr Ledell Noss on the day of the patient visit and I concur with her evaluation and management plan. I would consider addition of Topomax if migraines continue to occur on a frequent basis.

## 2016-03-15 ENCOUNTER — Ambulatory Visit (INDEPENDENT_AMBULATORY_CARE_PROVIDER_SITE_OTHER): Payer: BLUE CROSS/BLUE SHIELD | Admitting: Internal Medicine

## 2016-03-15 ENCOUNTER — Encounter: Payer: Self-pay | Admitting: Internal Medicine

## 2016-03-15 ENCOUNTER — Encounter: Payer: No Typology Code available for payment source | Admitting: Internal Medicine

## 2016-03-15 VITALS — BP 151/89 | HR 100 | Temp 98.2°F | Wt 217.0 lb

## 2016-03-15 DIAGNOSIS — Z7984 Long term (current) use of oral hypoglycemic drugs: Secondary | ICD-10-CM

## 2016-03-15 DIAGNOSIS — Z8709 Personal history of other diseases of the respiratory system: Secondary | ICD-10-CM

## 2016-03-15 DIAGNOSIS — Z79899 Other long term (current) drug therapy: Secondary | ICD-10-CM | POA: Diagnosis not present

## 2016-03-15 DIAGNOSIS — Z6841 Body Mass Index (BMI) 40.0 and over, adult: Secondary | ICD-10-CM

## 2016-03-15 DIAGNOSIS — Z09 Encounter for follow-up examination after completed treatment for conditions other than malignant neoplasm: Secondary | ICD-10-CM | POA: Diagnosis not present

## 2016-03-15 DIAGNOSIS — E669 Obesity, unspecified: Secondary | ICD-10-CM

## 2016-03-15 DIAGNOSIS — E119 Type 2 diabetes mellitus without complications: Secondary | ICD-10-CM

## 2016-03-15 DIAGNOSIS — I10 Essential (primary) hypertension: Secondary | ICD-10-CM | POA: Diagnosis not present

## 2016-03-15 DIAGNOSIS — J111 Influenza due to unidentified influenza virus with other respiratory manifestations: Secondary | ICD-10-CM

## 2016-03-15 DIAGNOSIS — E1169 Type 2 diabetes mellitus with other specified complication: Secondary | ICD-10-CM

## 2016-03-15 LAB — GLUCOSE, CAPILLARY: Glucose-Capillary: 266 mg/dL — ABNORMAL HIGH (ref 65–99)

## 2016-03-15 MED ORDER — GLIPIZIDE 10 MG PO TABS: 10.0000 mg | ORAL_TABLET | Freq: Two times a day (BID) | ORAL | 2 refills | Status: DC

## 2016-03-15 MED ORDER — METFORMIN HCL ER 500 MG PO TB24: 1000.0000 mg | ORAL_TABLET | Freq: Two times a day (BID) | ORAL | 2 refills | Status: DC

## 2016-03-15 MED ORDER — SITAGLIPTIN PHOSPHATE 100 MG PO TABS: 100.0000 mg | ORAL_TABLET | Freq: Every day | ORAL | 0 refills | Status: DC

## 2016-03-15 NOTE — Assessment & Plan Note (Signed)
Patient has poorly controlled diabetes. Her prior hemoglobin A1c before today was 8.9. This increased to 11 in the interval. She says over this time she has not been consistent with her diet or exercise. She also had a period of several weeks where she did not have any of her diabetic medications as she was not coming to her scheduled clinic visits. Currently, she is taking Januvia 100 mg once daily, metformin 1000 mg twice a day and glipizide 10 mg twice a day. When discussing possibly starting full insulin replacement the patient is not amenable to this and says she will not start insulin. She did not want to discuss further. We considered starting a fourth medication today but decided to wait to see  her hemoglobin A1c response now that she has access to all 3 of the above listed medications. We discussed ongoing diet and exercise. She will return to clinic in 3 months for repeat hemoglobin A1c and if her A1c remains elevated we will start a fourth agent if she is amenable. -- Januvia 100 mg once daily -- Metformin 1000 mg twice a day -- Glipizide 10 mg twice a day -- Follow-up in 3 months for repeat hemoglobin A1c

## 2016-03-15 NOTE — Patient Instructions (Signed)
It was a pleasure seeing you today. Thank you for choosing [facility] for your healthcare needs.   Please continue to take all of your medications as prescribed.  Please return to clinic in 3 months for ongoing evaluation and management of your diabetes.  Today, I want you to start taking an aspirin 81 mg once daily.  Please by the enteric coated aspirin. Again take one enteric coated aspirin 81 mg once  daily.  Please schedule an appointment to return to clinic in 3 months or earlier as needed.

## 2016-03-15 NOTE — Assessment & Plan Note (Signed)
Patient has essential hypertension. Her blood pressure was slightly elevated in the office today at [place]. If she remains hypertensive at her next visit she would benefit from either increasing one of her current medications or adding an additional agent. -- Accuretic 20-12.5 -- Propranolol 20 mg twice a day

## 2016-03-15 NOTE — Progress Notes (Signed)
   CC: Cough, hospital follow-up HPI: [name] is a 51 y.o. female with a h/o of essential hypertension, left bundle branch block, poorly controlled diabetes and hyperlipidemia who presents with improving cough after recent hospitalization secondary to influenza. Since being discharged from the hospital she feels much better. She continues to have mild cough but no sputum production. She denies fevers, chills. She denies nausea, vomiting or abdominal pain. She denies polyuria or dysuria.   Review of Systems: As above  Physical Exam: Vitals:   03/15/16 1419  BP: (!) 151/89  Pulse: 100  Temp: 98.2 F (36.8 C)  TempSrc: Oral  SpO2: 100%  Weight: 217 lb (98.4 kg)   BP (!) 151/89 (BP Location: Right Arm, Patient Position: Sitting, Cuff Size: Large)   Pulse 100   Temp 98.2 F (36.8 C) (Oral)   Wt 217 lb (98.4 kg)   SpO2 100%   BMI 42.38 kg/m  General appearance: alert, cooperative and Obese Lungs: clear to auscultation bilaterally Heart: regular rate and rhythm, S1, S2 normal, no murmur, click, rub or gallop Abdomen: soft, non-tender; bowel sounds normal; no masses,  no organomegaly Extremities: extremities normal, atraumatic, no cyanosis or edema  Assessment & Plan:  See encounters tab for problem based medical decision making. Patient discussed with Dr. Evette Doffing  Signed: Ophelia Shoulder, MD 03/15/2016, 3:18 PM  Pager: [phone]

## 2016-03-15 NOTE — Assessment & Plan Note (Signed)
The patient was recently hospitalized secondary to influenza with respiratory manifestations. She was admitted directly from the clinic to the internal medicine teaching service. In the hospital her influenza was positive by PCR and she was treated with Tamiflu. She was also given IV fluids for severe dehydration with improvement of her symptoms. She was discharged in good condition and instructed to take her medications as she was taking before she came to the hospital. Today at her post hospital follow-up visit she says she is doing much better. She has some intermittent cough but no sputum production. She denies fevers or chills. She denies nausea, vomiting or abdominal pain. She denies polyuria or dysuria. Overall she is much improved and doing well. -- Issue resolved

## 2016-03-20 NOTE — Progress Notes (Signed)
Internal Medicine Clinic Attending  Case discussed with Dr. Taylor at the time of the visit.  We reviewed the resident's history and exam and pertinent patient test results.  I agree with the assessment, diagnosis, and plan of care documented in the resident's note. 

## 2016-03-20 NOTE — Addendum Note (Signed)
Addended by: Lalla Brothers T on: 03/20/2016 09:55 AM   Modules accepted: Level of Service

## 2016-03-26 ENCOUNTER — Other Ambulatory Visit: Payer: Self-pay | Admitting: *Deleted

## 2016-03-26 MED ORDER — QUINAPRIL-HYDROCHLOROTHIAZIDE 20-12.5 MG PO TABS: 1.0000 | ORAL_TABLET | Freq: Every day | ORAL | 6 refills | Status: DC

## 2016-05-06 ENCOUNTER — Other Ambulatory Visit: Payer: Self-pay | Admitting: Internal Medicine

## 2016-05-06 DIAGNOSIS — E1169 Type 2 diabetes mellitus with other specified complication: Secondary | ICD-10-CM

## 2016-05-06 DIAGNOSIS — E669 Obesity, unspecified: Principal | ICD-10-CM

## 2016-05-15 ENCOUNTER — Telehealth: Payer: Self-pay | Admitting: Internal Medicine

## 2016-05-15 NOTE — Telephone Encounter (Signed)
APT. REMINDER CALL, LMTCB °

## 2016-05-16 ENCOUNTER — Encounter: Payer: Self-pay | Admitting: Internal Medicine

## 2016-05-16 ENCOUNTER — Encounter (INDEPENDENT_AMBULATORY_CARE_PROVIDER_SITE_OTHER): Payer: Self-pay

## 2016-05-16 ENCOUNTER — Ambulatory Visit (INDEPENDENT_AMBULATORY_CARE_PROVIDER_SITE_OTHER): Payer: BLUE CROSS/BLUE SHIELD | Admitting: Internal Medicine

## 2016-05-16 DIAGNOSIS — M5489 Other dorsalgia: Secondary | ICD-10-CM

## 2016-05-16 DIAGNOSIS — Z6841 Body Mass Index (BMI) 40.0 and over, adult: Secondary | ICD-10-CM

## 2016-05-16 NOTE — Patient Instructions (Addendum)
General Instructions: - Take ibuprofen 600 mg 2-3 times daily as needed - Can also take Tylenol 1000 mg three times daily as needed  - Try a warm compress over the area - Keep active! You don't want your back to get stiff.  Thank you for bringing your medicines today. This helps [name] keep you safe from mistakes.   Progress Toward Treatment Goals:  Treatment Goal 04/29/2013  Hemoglobin A1C deteriorated  Blood pressure at goal    Self Care Goals & Plans:  Self Care Goal 11/10/2014  Manage my medications take my medicines as prescribed; bring my medications to every visit; refill my medications on time  Monitor my health -  Eat healthy foods drink diet soda or water instead of juice or soda; eat more vegetables; eat foods that are low in salt; eat baked foods instead of fried foods; eat fruit for snacks and desserts  Be physically active take a walk every day  Meeting treatment goals maintain the current self-care plan    Home Blood Glucose Monitoring 04/29/2013  Check my blood sugar once a day  When to check my blood sugar N/A     Care Management & Community Referrals:  Referral 04/29/2013  Referrals made for care management support none needed  Referrals made to community resources none

## 2016-05-16 NOTE — Assessment & Plan Note (Signed)
Patient presenting with a 6 month hx of right-sided low to mid back pain that has worsened over the last month. She has tenderness to palpation over the paraspinal muscles, particularly on the right side. Her lipoma does not seem to be involved with the pain. Discussed with patient that conservative treatment is th best option at this point, which will include ibuprofen 600 mg 2-3 times daily PRN, tylenol 1000 mg TID PRN, and heating pad. Imaging not necessary at this time as she has no red flag symptoms. She was advised to return to clinic if her pain changes or has new symptoms such as leg weakness, numbness/tingling, or loss of bowel/bladder control.

## 2016-05-16 NOTE — Progress Notes (Signed)
   CC: Right-sided back pain   HPI:  [name] is a 51 y.o. woman with PMHx as noted below who presents today for evaluation of right-sided back pain.  She reports the pain started about 6 months ago but has worsened over the last month. She describes a sharp pain in her mid-low back that is mostly on the right side. She reports the pain is intermittent and tends to occur with bending over, coughing, or lying down. Pain is worst in the morning. Pain will sometimes radiate to the left side. She denies any radiation down her legs. 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 She denies any pain or redness over or around the lipoma, but has noticed it has increased in size. She denies any loss of bowel/bladder control, fever, or weakness in her legs. She is able to go to work and "deals with" the pain. She has been taking ibuprofen 600 mg once daily, tylenol 250 mg once daily, and hot baths which provide mild relief.   Past Medical History:  Diagnosis Date  . Asthma    as a child  . Carpal tunnel syndrome   . Diabetes mellitus type 2, uncontrolled (HCC) DX: 1999   Started as gestational diabetes  . ESSENTIAL HYPERTENSION 07/08/2006  . HSV (herpes simplex virus) anogenital infection   . HYPERLIPIDEMIA 08/20/2007  . Menorrhagia   . Migraine    Normal CT head (08/06/2006)  . Swelling of abdominal wall 03/02/2014    Review of Systems:   All negative except per HPI  Physical Exam:  Vitals:   05/16/16 0914  BP: (!) 151/71  Pulse: 96  Temp: 98.3 F (36.8 C)  TempSrc: Oral  SpO2: 100%  Weight: 214 lb 4.8 oz (97.2 kg)  Height: 5' (1.524 m)   General: Obese middle aged woman in NAD Msk: There is tenderness to palpation along the paraspinal muscles, predominantly in the mid-lower back on the right side.  Neuro: alert and oriented x 3. Strength 5/5 in lower extremities. Skin: A large lipoma is palpated on the right mid-back with no erythema or tenderness.    Assessment & Plan:   See Encounters Tab for problem based charting.  Patient seen with Dr. Evette Doffing

## 2016-05-17 NOTE — Progress Notes (Signed)
Internal Medicine Clinic Attending  I saw and evaluated the patient.  I personally confirmed the key portions of the history and exam documented by Dr. Arcelia Jew and I reviewed pertinent patient test results.  The assessment, diagnosis, and plan were formulated together and I agree with the documentation in the resident's note.  Flair of paraspinal low back pain in a patient with obesity. No radicular symptoms. I agree there are no red flag symptoms, and currently pain is not limiting activities. Will continue supportive care. Gave advice on NSAIDs and exercise. Advised against back braces, muscle relaxants, bed rest, opioids, and imaging for now. If pain progresses, can escalate care to imaging and further interventions.

## 2016-05-30 ENCOUNTER — Other Ambulatory Visit: Payer: Self-pay | Admitting: Internal Medicine

## 2016-05-30 DIAGNOSIS — I10 Essential (primary) hypertension: Secondary | ICD-10-CM

## 2016-06-07 ENCOUNTER — Encounter: Payer: Self-pay | Admitting: Internal Medicine

## 2016-06-07 ENCOUNTER — Ambulatory Visit (INDEPENDENT_AMBULATORY_CARE_PROVIDER_SITE_OTHER): Payer: BLUE CROSS/BLUE SHIELD | Admitting: Internal Medicine

## 2016-06-07 VITALS — BP 156/83 | HR 90 | Temp 98.7°F | Wt 216.2 lb

## 2016-06-07 DIAGNOSIS — Z0000 Encounter for general adult medical examination without abnormal findings: Secondary | ICD-10-CM

## 2016-06-07 DIAGNOSIS — E669 Obesity, unspecified: Principal | ICD-10-CM

## 2016-06-07 DIAGNOSIS — G8929 Other chronic pain: Secondary | ICD-10-CM

## 2016-06-07 DIAGNOSIS — I10 Essential (primary) hypertension: Secondary | ICD-10-CM | POA: Diagnosis not present

## 2016-06-07 DIAGNOSIS — E1169 Type 2 diabetes mellitus with other specified complication: Secondary | ICD-10-CM

## 2016-06-07 DIAGNOSIS — Z7984 Long term (current) use of oral hypoglycemic drugs: Secondary | ICD-10-CM

## 2016-06-07 DIAGNOSIS — M549 Dorsalgia, unspecified: Secondary | ICD-10-CM | POA: Diagnosis not present

## 2016-06-07 DIAGNOSIS — E118 Type 2 diabetes mellitus with unspecified complications: Secondary | ICD-10-CM

## 2016-06-07 DIAGNOSIS — Z79899 Other long term (current) drug therapy: Secondary | ICD-10-CM | POA: Diagnosis not present

## 2016-06-07 LAB — POCT GLYCOSYLATED HEMOGLOBIN (HGB A1C): Hemoglobin A1C: 11

## 2016-06-07 LAB — GLUCOSE, CAPILLARY: Glucose-Capillary: 298 mg/dL — ABNORMAL HIGH (ref 65–99)

## 2016-06-07 MED ORDER — INSULIN PEN NEEDLE 31G X 5 MM MISC
1.0000 | Freq: Every day | 3 refills | Status: DC
Start: 2016-06-07 — End: 2018-11-05

## 2016-06-07 MED ORDER — INSULIN GLARGINE 100 UNIT/ML ~~LOC~~ SOLN: 10.0000 [IU] | Freq: Every day | SUBCUTANEOUS | 11 refills | Status: DC

## 2016-06-07 NOTE — Progress Notes (Signed)
Case discussed with Dr. Taylor at the time of the visit. We reviewed the resident's history and exam and pertinent patient test results. I agree with the assessment, diagnosis, and plan of care documented in the resident's note. 

## 2016-06-07 NOTE — Assessment & Plan Note (Signed)
Patient has essential hypertension. She is currently on propranolol as well as combination antihypertensive medications. Her blood pressure is still elevated today at 156/83. However, the patient has chronic back pain and she says it is an acute flare which could be causing her to be hypertensive. Also, given that we are starting long-acting injecting insulin the patient does not wish to start other medications at this time. We will continue with current therapy and continue to follow. -- Continue current therapy

## 2016-06-07 NOTE — Progress Notes (Signed)
   CC: Diabetes and chronic medical conditions HPI: [name] is a 51 y.o. female with a past medical history as listed below who presents for diabetes and chronic medical management.  Past Medical History:  Diagnosis Date  . Asthma    as a child  . Carpal tunnel syndrome   . Diabetes mellitus type 2, uncontrolled (HCC) DX: 1999   Started as gestational diabetes  . ESSENTIAL HYPERTENSION 07/08/2006  . HSV (herpes simplex virus) anogenital infection   . HYPERLIPIDEMIA 08/20/2007  . Menorrhagia   . Migraine    Normal CT head (08/06/2006)  . Swelling of abdominal wall 03/02/2014     Review of Systems: Denies chest pain or shortness of breath. Denies nausea, vomiting or abdominal pain. Physical Exam: Vitals:   06/07/16 1422  BP: (!) 156/83  Pulse: 90  Temp: 98.7 F (37.1 C)  TempSrc: Oral  SpO2: 100%  Weight: 216 lb 3.2 oz (98.1 kg)   General appearance: alert and cooperative Head: Normocephalic, without obvious abnormality, atraumatic Lungs: clear to auscultation bilaterally Heart: regular rate and rhythm, S1, S2 normal, no murmur, click, rub or gallop Abdomen: soft, non-tender; bowel sounds normal; no masses,  no organomegaly Extremities: extremities normal, atraumatic, no cyanosis or edema  Assessment & Plan:  See encounters tab for problem based medical decision making. Patient discussed with Dr. Eppie [name]  Signed: Ophelia Shoulder, MD 06/07/2016, 2:47 PM  Pager: [phone]

## 2016-06-07 NOTE — Patient Instructions (Signed)
It was a pleasure seeing you today. Thank you for choosing [facility] for your healthcare needs.   Your doing a great job!  Please continue to take your medicines for your blood pressure.  Please continue to take your medications for diabetes.  We are starting a new medication called Lantus. You are to inject this once nightly every night. Please check your blood sugar every morning when you wake up and record this information. Please follow the directions that Dr. Maudie Mercury has discussed with you. Please return to clinic in 1 month for evaluation of your diabetes.

## 2016-06-07 NOTE — Assessment & Plan Note (Addendum)
Patient continues to have poorly controlled diabetes. Prior hemoglobin A1c of 11.6. Hemoglobin A1c today of 11.0. She is currently on several medications including Januvia 100 mg once daily, metformin 1000 mg twice a day, glipizide 10 mg twice a day. In the past she has not been amenable to starting and injecting insulin. However, today she realizes that her hemoglobin A1c is still too high and her sugars are out of control and wants to start. We will start with long-acting insulin glargine once at night. We will start with 10 units. We'll titrate up as necessary. I instructed the patient to check her sugar every morning for the next month and record this in a log. I will have her follow-up in clinic in one month to ensure she is taking this medication appropriately and that she is not having any side effects. Our clinical pharmacist has spent time counseling and educating the patient on the correct way to take this medication today. She has been provided with a sample. -- Continue metformin, Januvia and glipizide -- Start glargine 10 units daily at bedtime -- Blood sugar log -- Return to clinic in 1 month  ADDENDUM Patient switched to [place] a generic of Lantus 2/2 cost issues.

## 2016-06-07 NOTE — Assessment & Plan Note (Signed)
Patient will have referral for eye examination.

## 2016-06-11 ENCOUNTER — Other Ambulatory Visit: Payer: Self-pay | Admitting: Internal Medicine

## 2016-06-11 DIAGNOSIS — E1169 Type 2 diabetes mellitus with other specified complication: Secondary | ICD-10-CM

## 2016-06-11 DIAGNOSIS — E669 Obesity, unspecified: Principal | ICD-10-CM

## 2016-06-11 NOTE — Telephone Encounter (Signed)
Insulin written not covered under her insurance, needs different kind, [facility] on [address]

## 2016-06-12 MED ORDER — BASAGLAR KWIKPEN 100 UNIT/ML ~~LOC~~ SOPN: 10.0000 [IU] | PEN_INJECTOR | Freq: Every day | SUBCUTANEOUS | 11 refills | Status: DC

## 2016-06-12 NOTE — Telephone Encounter (Signed)
We are working ou this.  Thanks, Jeneen Rinks

## 2016-06-28 ENCOUNTER — Other Ambulatory Visit: Payer: Self-pay | Admitting: Internal Medicine

## 2016-06-28 DIAGNOSIS — E1169 Type 2 diabetes mellitus with other specified complication: Secondary | ICD-10-CM

## 2016-06-28 DIAGNOSIS — E669 Obesity, unspecified: Principal | ICD-10-CM

## 2016-07-02 ENCOUNTER — Encounter: Payer: Self-pay | Admitting: *Deleted

## 2016-07-02 ENCOUNTER — Encounter: Payer: BLUE CROSS/BLUE SHIELD | Admitting: Internal Medicine

## 2016-07-13 LAB — HM DIABETES EYE EXAM

## 2016-08-21 ENCOUNTER — Encounter: Payer: Self-pay | Admitting: *Deleted

## 2016-09-13 ENCOUNTER — Other Ambulatory Visit: Payer: Self-pay | Admitting: Internal Medicine

## 2016-09-13 NOTE — Telephone Encounter (Signed)
Refill Request for Lipitor

## 2016-09-14 MED ORDER — ATORVASTATIN CALCIUM 20 MG PO TABS: 20.0000 mg | ORAL_TABLET | Freq: Every day | ORAL | 6 refills | Status: DC

## 2016-09-18 ENCOUNTER — Other Ambulatory Visit: Payer: Self-pay | Admitting: Internal Medicine

## 2016-09-18 ENCOUNTER — Ambulatory Visit (INDEPENDENT_AMBULATORY_CARE_PROVIDER_SITE_OTHER): Payer: BLUE CROSS/BLUE SHIELD | Admitting: Internal Medicine

## 2016-09-18 VITALS — BP 141/78 | HR 84 | Temp 98.1°F | Ht 60.0 in | Wt 216.2 lb

## 2016-09-18 DIAGNOSIS — E6609 Other obesity due to excess calories: Secondary | ICD-10-CM

## 2016-09-18 DIAGNOSIS — E118 Type 2 diabetes mellitus with unspecified complications: Secondary | ICD-10-CM

## 2016-09-18 DIAGNOSIS — E669 Obesity, unspecified: Secondary | ICD-10-CM

## 2016-09-18 DIAGNOSIS — N924 Excessive bleeding in the premenopausal period: Secondary | ICD-10-CM | POA: Diagnosis not present

## 2016-09-18 DIAGNOSIS — Z6841 Body Mass Index (BMI) 40.0 and over, adult: Secondary | ICD-10-CM

## 2016-09-18 DIAGNOSIS — I10 Essential (primary) hypertension: Secondary | ICD-10-CM | POA: Diagnosis not present

## 2016-09-18 DIAGNOSIS — E1169 Type 2 diabetes mellitus with other specified complication: Secondary | ICD-10-CM

## 2016-09-18 MED ORDER — NAPROXEN 500 MG PO TABS: 500.0000 mg | ORAL_TABLET | Freq: Two times a day (BID) | ORAL | 0 refills | Status: DC

## 2016-09-18 NOTE — Progress Notes (Signed)
   CC: Heavy vaginal bleeding after 1 year of amenorrhea,associated with abdominal cramp.  HPI:  [name] is a 51 y.o. lady with past medical history as listed below came to the clinic with a complaint of starting menstrual heavy bleeding associated with abdominal cramps yesterday. According to patient she stopped her period one year ago. She denied any irregularities in her cycle before stopping her periods. According to her she was having regular heavy menstrual cycle associated with abdominal cramping one year ago and all of a sudden she stopped her periods. She started vaginal bleeding along with abdominal cramps similar to her previous cycles yesterday. According to patient she was having heavy menstrual  And did saw gynecologist more than a year ago and they were recommending some surgery but she never had it done as she stopped her cycle.She denies any clotting. She denies any urinary symptoms. She was using ibuprofen with minimum relief. Patient is sexually active and do not use any precautions because of her recent amenorrhea.  Past Medical History:  Diagnosis Date  . Asthma    as a child  . Carpal tunnel syndrome   . Diabetes mellitus type 2, uncontrolled (HCC) DX: 1999   Started as gestational diabetes  . ESSENTIAL HYPERTENSION 07/08/2006  . HSV (herpes simplex virus) anogenital infection   . HYPERLIPIDEMIA 08/20/2007  . Menorrhagia   . Migraine    Normal CT head (08/06/2006)  . Swelling of abdominal wall 03/02/2014   Review of Systems:  Negative except mentioned in history of present illness.  Physical Exam:  Vitals:   09/18/16 1458  BP: (!) 141/78  Pulse: 84  Temp: 98.1 F (36.7 C)  TempSrc: Oral  SpO2: 100%  Weight: 216 lb 3.2 oz (98.1 kg)  Height: 5' (1.524 m)    General: Vital signs reviewed.  Patient is well-developed and well-nourished, in no acute distress and cooperative with exam.  Head: Normocephalic and atraumatic. Eyes: EOMI, conjunctivae  normal, no scleral icterus.  Cardiovascular: RRR, S1 normal, S2 normal, no murmurs, gallops, or rubs. Pulmonary/Chest: Clear to auscultation bilaterally, no wheezes, rales, or rhonchi. Abdominal: Soft, non-tender, non-distended, BS +, no masses, organomegaly, or guarding present.  Extremities: No lower extremity edema bilaterally,  pulses symmetric and intact bilaterally. No cyanosis or clubbing. Skin: Warm, dry and intact. No rashes or erythema. Psychiatric: Normal mood and affect. speech and behavior is normal. Cognition and memory are normal.  Assessment & Plan:   See Encounters Tab for problem based charting.  Patient discussed with Dr. Daryll Drown.

## 2016-09-18 NOTE — Assessment & Plan Note (Signed)
She has poorly controlled diabetes. She was started on Lantus 10 units at bedtime during her previous office visit in May 2018, also lost to follow-up in one month. According to patient her appointment was in June which was canceled by clinic and never rescheduled. She was offered A1c today which she declined stating that she will discuss her diabetes and hypertension during her next office visit with PCP in October 2018.  -Continue current management and reassess during her next office visit with PCP.

## 2016-09-18 NOTE — Assessment & Plan Note (Signed)
BP Readings from Last 3 Encounters:  09/18/16 (!) 141/78  06/07/16 (!) 156/83  05/16/16 (!) 151/71   Her blood pressure was mildly elevated. Patient do not want to make any changes as she was having a lot of pain.  She has an appointment with her PCP in October 2018 we will reassess her blood pressure during that visit.

## 2016-09-18 NOTE — Assessment & Plan Note (Signed)
She might be experiencing peri menopausal menorrhagia and irregular cycle. It is little unusual having a perimenopausal amenorrhea for more than a year. Her current symptoms are similar to her previous cycle.  -Check FSH and LH. -Transvaginal ultrasound to rule out any uterine pathology. -Naproxen in 500 mg twice a day for pain. -Referral to gynecology for further workup and management.

## 2016-09-18 NOTE — Addendum Note (Signed)
Addended by: Lorella Nimrod on: 09/18/2016 05:28 PM   Modules accepted: Orders

## 2016-09-18 NOTE — Patient Instructions (Addendum)
Thank you for visiting clinic today. We will check some lab for today, we will call you with any abnormal results. I'm also placing an order for an ultrasound. We are also referring due to see a gynecologist. I'm also giving you a prescription strength naproxen for pain. You can try using heating pad to help with the pain. Please follow-up according to your scheduled appointment in October 2018 for your diabetes and hypertension.

## 2016-09-19 LAB — FOLLICLE STIMULATING HORMONE: FSH: 18 m[IU]/mL

## 2016-09-19 LAB — LUTEINIZING HORMONE: LH: 17 m[IU]/mL

## 2016-09-25 ENCOUNTER — Ambulatory Visit (HOSPITAL_COMMUNITY)
Admission: RE | Admit: 2016-09-25 | Discharge: 2016-09-25 | Disposition: A | Discharge status: Home / Self Care | Payer: BLUE CROSS/BLUE SHIELD | Source: Ambulatory Visit | Attending: Internal Medicine | Admitting: Internal Medicine

## 2016-09-25 DIAGNOSIS — N924 Excessive bleeding in the premenopausal period: Secondary | ICD-10-CM | POA: Diagnosis present

## 2016-09-26 NOTE — Progress Notes (Signed)
Internal Medicine Clinic Attending  Case discussed with Dr. Amin at the time of the visit.  We reviewed the resident's history and exam and pertinent patient test results.  I agree with the assessment, diagnosis, and plan of care documented in the resident's note.    

## 2016-10-26 ENCOUNTER — Other Ambulatory Visit: Payer: Self-pay | Admitting: *Deleted

## 2016-10-26 ENCOUNTER — Other Ambulatory Visit: Payer: Self-pay | Admitting: Family Medicine

## 2016-10-26 DIAGNOSIS — E669 Obesity, unspecified: Secondary | ICD-10-CM

## 2016-10-26 DIAGNOSIS — E1169 Type 2 diabetes mellitus with other specified complication: Secondary | ICD-10-CM

## 2016-10-26 DIAGNOSIS — I10 Essential (primary) hypertension: Secondary | ICD-10-CM

## 2016-10-26 MED ORDER — METFORMIN HCL ER 500 MG PO TB24: 1000.0000 mg | ORAL_TABLET | Freq: Two times a day (BID) | ORAL | 3 refills | Status: DC

## 2016-10-26 MED ORDER — PROPRANOLOL HCL 20 MG PO TABS: 20.0000 mg | ORAL_TABLET | Freq: Two times a day (BID) | ORAL | 3 refills | Status: DC

## 2016-10-26 NOTE — Telephone Encounter (Signed)
Patient is requesting a refill for Metformin she has been out of it for 2 weeks now. Pls call patient

## 2016-10-26 NOTE — Telephone Encounter (Signed)
Called pt, confirmed she is [facility] pt, request sent

## 2016-10-29 ENCOUNTER — Telehealth: Payer: Self-pay | Admitting: Internal Medicine

## 2016-10-29 ENCOUNTER — Other Ambulatory Visit: Payer: Self-pay

## 2016-10-29 NOTE — Telephone Encounter (Signed)
These were filled by dr Lynnae January 10/5, called [facility] and was told pt picked them up 10/6 at 1811. Have called pharm back to see if there is a problem, there was a cross up in messages the pharmacist states, pt's scripts are good and have been refilled and picked up

## 2016-10-29 NOTE — Telephone Encounter (Signed)
Per Alwyn Ren at the [place] would like the request for the patient to be cancel

## 2016-10-29 NOTE — Telephone Encounter (Signed)
metFORMIN (GLUCOPHAGE-XR) 500 MG 24 hr tablet  propranolol (INDERAL) 20 MG tablet, refill request @ [place].

## 2016-11-12 ENCOUNTER — Encounter: Payer: BLUE CROSS/BLUE SHIELD | Admitting: Obstetrics & Gynecology

## 2016-11-15 ENCOUNTER — Encounter: Payer: Self-pay | Admitting: Internal Medicine

## 2016-11-15 ENCOUNTER — Encounter (INDEPENDENT_AMBULATORY_CARE_PROVIDER_SITE_OTHER): Payer: Self-pay

## 2016-11-15 ENCOUNTER — Ambulatory Visit (INDEPENDENT_AMBULATORY_CARE_PROVIDER_SITE_OTHER): Payer: BLUE CROSS/BLUE SHIELD | Admitting: Internal Medicine

## 2016-11-15 VITALS — BP 123/68 | HR 90 | Temp 98.4°F | Ht 60.0 in | Wt 218.3 lb

## 2016-11-15 DIAGNOSIS — Z6841 Body Mass Index (BMI) 40.0 and over, adult: Secondary | ICD-10-CM | POA: Diagnosis not present

## 2016-11-15 DIAGNOSIS — E1169 Type 2 diabetes mellitus with other specified complication: Secondary | ICD-10-CM | POA: Diagnosis not present

## 2016-11-15 DIAGNOSIS — I10 Essential (primary) hypertension: Secondary | ICD-10-CM | POA: Diagnosis not present

## 2016-11-15 DIAGNOSIS — N924 Excessive bleeding in the premenopausal period: Secondary | ICD-10-CM

## 2016-11-15 DIAGNOSIS — Z794 Long term (current) use of insulin: Secondary | ICD-10-CM

## 2016-11-15 DIAGNOSIS — Z79899 Other long term (current) drug therapy: Secondary | ICD-10-CM

## 2016-11-15 DIAGNOSIS — E669 Obesity, unspecified: Secondary | ICD-10-CM | POA: Diagnosis not present

## 2016-11-15 LAB — POCT GLYCOSYLATED HEMOGLOBIN (HGB A1C): Hemoglobin A1C: 8.9

## 2016-11-15 LAB — GLUCOSE, CAPILLARY: Glucose-Capillary: 173 mg/dL — ABNORMAL HIGH (ref 65–99)

## 2016-11-15 NOTE — Assessment & Plan Note (Addendum)
Patient has essential hypertension. BP today improved at 123/68. Currently on Accuretic 20-12.5 and Propranolol 20 mg twice a day. Last BMP 02/23/16 (Ca 8.7 otherwise WNL). Will continue current medications - Accuretic 20-12.5 - Propranolol 20 mg twice a day - Lifestyle modifications (diet and exercise)

## 2016-11-15 NOTE — Patient Instructions (Signed)
Thank you for allowing [name] to care for you  Your blood pressure was much better today, 123/68 - Keep up the good work  Your A1C ( a measure of your blood sugar ) was improved to 8.9 (down from 11.0) - Continue with current medications - Continue to work on lifestyles modifications (diet and exercise)  Return to the clinic in 3 months

## 2016-11-15 NOTE — Assessment & Plan Note (Signed)
Suspected perimenopausal menorrhagia and irregular cycle. Though unusual having a perimenopausal amenorrhea for more than a year. Her current symptoms are similar to her previous cycle. -- FSH and LH were WNL (Sandra [name] was more consistent with ovulation phase than menopause). -Transvaginal ultrasound was negative for pathology but endometrium was not well visualized due to bowl gas. --------Naproxen in 500 mg twice a day for pain. ---------Referral to gynecology for further workup and management.

## 2016-11-15 NOTE — Progress Notes (Signed)
   CC: Hypertension, Diabetes    HPI:  [name] is a 51 y.o. F with PMHx listed below presenting for Hypertension, Diabetes. Please see the A&P for the status of the patient's chronic medical problems.   Past Medical History:  Diagnosis Date  . Asthma    as a child  . Carpal tunnel syndrome   . Diabetes mellitus type 2, uncontrolled (HCC) DX: 1999   Started as gestational diabetes  . ESSENTIAL HYPERTENSION 07/08/2006  . HSV (herpes simplex virus) anogenital infection   . HYPERLIPIDEMIA 08/20/2007  . Menorrhagia   . Migraine    Normal CT head (08/06/2006)  . Swelling of abdominal wall 03/02/2014   Review of Systems:  Performed and negative except as otherwise indicated.  Physical Exam:  Vitals:   11/15/16 1536  BP: 123/68  Pulse: 90  Temp: 98.4 F (36.9 C)  TempSrc: Oral  SpO2: 99%  Weight: 218 lb 4.8 oz (99 kg)  Height: 5' (1.524 m)   Physical Exam  Constitutional: She is oriented to person, place, and time. She appears well-developed and well-nourished.  Obese Female  HENT:  Head: Normocephalic and atraumatic.  Eyes: EOM are normal. Right eye exhibits no discharge. Left eye exhibits no discharge.  Cardiovascular: Normal rate, regular rhythm and normal heart sounds.   Pulmonary/Chest: Effort normal and breath sounds normal. No respiratory distress.  Abdominal: Soft. Bowel sounds are normal. She exhibits no distension. There is no tenderness.  Musculoskeletal: She exhibits no edema or deformity.  Neurological: She is alert and oriented to person, place, and time.  Skin: Skin is warm and dry.  Psychiatric: She has a normal mood and affect.     Assessment & Plan:   See Encounters Tab for problem based charting.  Patient seen with Dr. Eppie Gibson

## 2016-11-15 NOTE — Assessment & Plan Note (Addendum)
Patient with history poorly controlled diabetes. A1c today 8.9 which is significantly improved from 05/2016 (11.0). Currently on Januvia 100 mg daily, metformin 1000 mg BID, glipizide 10 mg BID, and 10 units Lantus qhs.  Significantly improved A1C today. She is resistant to increasing her medications due to cost and is interested in attempting lifestyle modifications first. Will continue current regimen considering significant progress thus far and recheck in 3 months. - A1C today: 8.9 - Continue metformin, Januvia and glipizide - Continue Basaglar 10 units qhs - Continue Blood sugar log - Lifestyle modifcations - Return to clinic in 3 months

## 2016-11-26 NOTE — Progress Notes (Signed)
Patient ID: Sandra [name], female   DOB: [birth year], 51 y.o.   MRN: [id number]  I saw and evaluated the patient.  I personally confirmed the key portions of Dr. Tally Joe history and exam and reviewed pertinent patient test results.  The assessment, diagnosis, and plan were formulated together and I agree with the documentation in the resident's note.

## 2016-11-30 ENCOUNTER — Other Ambulatory Visit: Payer: Self-pay

## 2016-11-30 ENCOUNTER — Encounter: Payer: Self-pay | Admitting: Internal Medicine

## 2016-11-30 ENCOUNTER — Ambulatory Visit: Payer: BLUE CROSS/BLUE SHIELD | Admitting: Internal Medicine

## 2016-11-30 ENCOUNTER — Other Ambulatory Visit: Payer: Self-pay | Admitting: Internal Medicine

## 2016-11-30 DIAGNOSIS — E1169 Type 2 diabetes mellitus with other specified complication: Secondary | ICD-10-CM

## 2016-11-30 DIAGNOSIS — R05 Cough: Secondary | ICD-10-CM | POA: Diagnosis not present

## 2016-11-30 DIAGNOSIS — B9789 Other viral agents as the cause of diseases classified elsewhere: Principal | ICD-10-CM

## 2016-11-30 DIAGNOSIS — J069 Acute upper respiratory infection, unspecified: Secondary | ICD-10-CM | POA: Diagnosis not present

## 2016-11-30 DIAGNOSIS — E669 Obesity, unspecified: Principal | ICD-10-CM

## 2016-11-30 MED ORDER — DM-GUAIFENESIN ER 30-600 MG PO TB12: 1.0000 | ORAL_TABLET | Freq: Two times a day (BID) | ORAL | 0 refills | Status: DC

## 2016-11-30 NOTE — Progress Notes (Signed)
   CC: Cough  HPI:  Sandra [name] is a 51 y.o. female with a past medical history listed below here today for follow up of her cough.  Reports that 5 days ago over the weekend she developed rhinorrhea with yellow sputum, body aches, chills, and sore throat. She subsequently developed cough with yellow sputum production. Reports one low grade fever of 99.8 but otherwise has been afebrile. Reports she has developed right sided pleuritic chest pain from coughing. Says that she got over the counter Tamiflu that she started taking 4 days ago. 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 She says that her work has a tele-doctor that she saw 3 days ago and was told she had sinuitis and was given a prescription for Augmentin that she has been taking.   Past Medical History:  Diagnosis Date  . Asthma    as a child  . Carpal tunnel syndrome   . Diabetes mellitus type 2, uncontrolled (HCC) DX: 1999   Started as gestational diabetes  . ESSENTIAL HYPERTENSION 07/08/2006  . HSV (herpes simplex virus) anogenital infection   . HYPERLIPIDEMIA 08/20/2007  . Menorrhagia   . Migraine    Normal CT head (08/06/2006)  . Swelling of abdominal wall 03/02/2014   Review of Systems:   Negative except as noted in HPI  Physical Exam:  Vitals:   11/30/16 1328  BP: (!) 171/86  Pulse: 91  Temp: 98.5 F (36.9 C)  TempSrc: Oral  SpO2: 99%  Weight: 219 lb 14.4 oz (99.7 kg)  Height: 5' (1.524 m)    Physical Exam  Constitutional: She is well-developed, well-nourished, and in no distress. No distress.  HENT:  Head: Normocephalic and atraumatic.  Right Ear: External ear normal.  Left Ear: External ear normal.  Nose: Nose normal.  Mouth/Throat: Oropharynx is clear and moist. No oropharyngeal exudate.  Eyes: Conjunctivae and EOM are normal. Pupils are equal, round, and reactive to light. Right eye exhibits no discharge. Left eye exhibits no discharge. No scleral icterus.  Neck: Normal range of  motion.  Cardiovascular: Normal rate, regular rhythm and normal heart sounds.  Pulmonary/Chest: Effort normal and breath sounds normal. No respiratory distress. She has no wheezes. She has no rales.  Abdominal: Soft. Bowel sounds are normal. There is no tenderness.  Lymphadenopathy:    She has no cervical adenopathy.  Skin: Skin is warm and dry. No rash noted.  Psychiatric: Mood and affect normal.  Vitals reviewed.   Assessment & Plan:   See Encounters Tab for problem based charting.  Patient discussed with Dr. Beryle Beams

## 2016-11-30 NOTE — Patient Instructions (Signed)
Sandra [name],  Continue the antibiotic course and finish it out. I have sent in a new prescription for Mucinex DM.   If you aren't doing any better in the next week let us know.

## 2016-11-30 NOTE — Progress Notes (Signed)
Medicine attending: Medical history, presenting problems, physical findings, and medications, reviewed with resident physician Dr Nathan Boswell on the day of the patient visit and I concur with his evaluation and management plan. 

## 2016-11-30 NOTE — Telephone Encounter (Signed)
Patient requesting refills on medicine °

## 2016-11-30 NOTE — Assessment & Plan Note (Signed)
Patient with viral URI and cough. No evidence of PNA on exam. Afebrile today. Exam reassuring. Has taken a course of Tamiflu already and is on Augment from outside provider.   Will continue with symptomatic treatment. Reports Mucinex DM has helped in the past. Will send in Rx today.

## 2016-12-03 ENCOUNTER — Other Ambulatory Visit: Payer: Self-pay | Admitting: *Deleted

## 2016-12-03 MED ORDER — SITAGLIPTIN PHOSPHATE 100 MG PO TABS: 100.0000 mg | ORAL_TABLET | Freq: Every day | ORAL | 1 refills | Status: DC

## 2016-12-03 MED ORDER — QUINAPRIL-HYDROCHLOROTHIAZIDE 20-12.5 MG PO TABS: 1.0000 | ORAL_TABLET | Freq: Every day | ORAL | 1 refills | Status: DC

## 2016-12-03 NOTE — Telephone Encounter (Signed)
Patient calling regarding refill request. Informed quinapril-hctz & Celesta Gentile both were sent to [facility] today.

## 2016-12-19 NOTE — Addendum Note (Signed)
Addended by: Hulan Fray on: 12/19/2016 06:27 PM   Modules accepted: Orders

## 2016-12-25 ENCOUNTER — Other Ambulatory Visit: Payer: Self-pay | Admitting: *Deleted

## 2016-12-25 NOTE — Telephone Encounter (Signed)
Pt calls and states her cough is no better in fact she feels it is worse. She is offered an appt and states she does not know if her work will let her come to appt, states she will call back.

## 2016-12-27 ENCOUNTER — Encounter (INDEPENDENT_AMBULATORY_CARE_PROVIDER_SITE_OTHER): Payer: Self-pay

## 2016-12-27 ENCOUNTER — Other Ambulatory Visit: Payer: Self-pay

## 2016-12-27 ENCOUNTER — Ambulatory Visit: Payer: BLUE CROSS/BLUE SHIELD | Admitting: Internal Medicine

## 2016-12-27 VITALS — BP 139/76 | HR 89 | Temp 98.0°F | Ht 60.0 in | Wt 216.7 lb

## 2016-12-27 DIAGNOSIS — R05 Cough: Secondary | ICD-10-CM

## 2016-12-27 DIAGNOSIS — J069 Acute upper respiratory infection, unspecified: Secondary | ICD-10-CM | POA: Diagnosis not present

## 2016-12-27 DIAGNOSIS — B9789 Other viral agents as the cause of diseases classified elsewhere: Principal | ICD-10-CM

## 2016-12-27 MED ORDER — FLUTICASONE PROPIONATE 50 MCG/ACT NA SUSP: 1.0000 | Freq: Every day | NASAL | 0 refills | Status: DC

## 2016-12-27 NOTE — Assessment & Plan Note (Signed)
Patient presents with persistent cough secondary to viral URI. The patient was seen for the same on 11/30/16 (at which time she had already completed a course of Augmentin from an outside provider) and was prescribed Mucinex-DM at that time. This helped a little, but her cough seemed to get worse after getting better. She has been having difficulty sleeping due to worsening cough at night. She has taken cough syrup during the day but has not tried PM cough syrup yet. 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 - Flonase daily - Nasal rinse daily before bed - PM Cough syrup before bed - Return to clinic if cough fails to improve after 2 weeks

## 2016-12-27 NOTE — Progress Notes (Signed)
   CC: Cough  HPI:  [name] is a 51 y.o. F with PMHx listed below presenting for Cough. Please see the A&P for the status of the patient's chronic medical problems.  Patient presents with persistent cough secondary to viral URI. The patient was seen for the same on 11/30/16 (at which time she had already completed a course of Augmentin from an outside provider) and was prescribed Mucinex-DM at that time. This helped a little, but her cough seemed to get worse after getting better. She has been having difficulty sleeping due to worsening cough at night. She has taken cough syrup during the day but has not tried PM cough syrup yet.  Past Medical History:  Diagnosis Date  . Asthma    as a child  . Carpal tunnel syndrome   . Diabetes mellitus type 2, uncontrolled (HCC) DX: 1999   Started as gestational diabetes  . ESSENTIAL HYPERTENSION 07/08/2006  . HSV (herpes simplex virus) anogenital infection   . HYPERLIPIDEMIA 08/20/2007  . Menorrhagia   . Migraine    Normal CT head (08/06/2006)  . Swelling of abdominal wall 03/02/2014   Review of Systems:  Performed and negative except as otherwise indicated.  Physical Exam:  Vitals:   12/27/16 1547 12/27/16 1617  BP: (!) 173/134 139/76  Pulse: (!) 108 89  Temp: 98 F (36.7 C)   TempSrc: Oral   SpO2: 97%   Weight: 216 lb 11.2 oz (98.3 kg)   Height: 5' (1.524 m)    Physical Exam  HENT:  Mouth/Throat: Oropharynx is clear and moist. No oropharyngeal exudate.  Cardiovascular: Normal rate, regular rhythm and normal heart sounds.  Pulmonary/Chest: Effort normal and breath sounds normal. No respiratory distress.  Abdominal: Soft. Bowel sounds are normal. She exhibits no distension. There is no tenderness.     Assessment & Plan:   See Encounters Tab for problem based charting.  Patient seen with Dr. Dareen Piano

## 2016-12-27 NOTE — Patient Instructions (Addendum)
Thank you for allowing [name] to care for you.  For your cough: - We have prescribed Flonase to be sprayed in each nostril daily in the morning - Please pick up a nasal rinse (such as neti pot) to be used to rinse your nasal passages before bed - Pick up a PM cough syrup to be taken before bedtime   Return to clinic if your cough fails to improve in after 2 weeks

## 2016-12-28 MED ORDER — NAPROXEN 500 MG PO TABS: 500.0000 mg | ORAL_TABLET | Freq: Two times a day (BID) | ORAL | 0 refills | Status: DC

## 2016-12-28 NOTE — Telephone Encounter (Signed)
Rx approved

## 2017-01-23 NOTE — Progress Notes (Signed)
Internal Medicine Clinic Attending  I saw and evaluated the patient.  I personally confirmed the key portions of the history and exam documented by Dr. Melvin and I reviewed pertinent patient test results.  The assessment, diagnosis, and plan were formulated together and I agree with the documentation in the resident's note.  

## 2017-02-21 ENCOUNTER — Other Ambulatory Visit: Payer: Self-pay | Admitting: *Deleted

## 2017-02-21 ENCOUNTER — Encounter: Payer: BLUE CROSS/BLUE SHIELD | Admitting: Internal Medicine

## 2017-02-21 DIAGNOSIS — E1169 Type 2 diabetes mellitus with other specified complication: Secondary | ICD-10-CM

## 2017-02-21 DIAGNOSIS — E669 Obesity, unspecified: Principal | ICD-10-CM

## 2017-02-21 NOTE — Telephone Encounter (Signed)
Next appt scheduled 2/14 with PCP. 

## 2017-02-22 MED ORDER — GLIPIZIDE 10 MG PO TABS: 10.0000 mg | ORAL_TABLET | Freq: Two times a day (BID) | ORAL | 3 refills | Status: DC

## 2017-02-22 NOTE — Telephone Encounter (Signed)
Refill Approved

## 2017-03-06 NOTE — Progress Notes (Signed)
   CC: Diabetes, Hypertension, Hyperlipidemia, Migraines  HPI:  [name] is a 52 y.o. F with PMHx listed below presenting for Diabetes, Hypertension, Hyperlipidemia, Migraines. Please see the A&P for the status of the patient's chronic medical problems.   Past Medical History:  Diagnosis Date  . Asthma    as a child  . Carpal tunnel syndrome   . Diabetes mellitus type 2, uncontrolled (HCC) DX: 1999   Started as gestational diabetes  . ESSENTIAL HYPERTENSION 07/08/2006  . HSV (herpes simplex virus) anogenital infection   . HYPERLIPIDEMIA 08/20/2007  . Menorrhagia   . Migraine    Normal CT head (08/06/2006)  . Swelling of abdominal wall 03/02/2014   Review of Systems: Performed and all others negative.  Physical Exam:  Vitals:   03/07/17 1415 03/07/17 1447  BP: (!) 150/78 135/69  Pulse: 97 99  Temp: 98.6 F (37 C)   TempSrc: Oral   SpO2: 100%   Weight: 218 lb 14.4 oz (99.3 kg)    Physical Exam  Constitutional: She appears well-developed and well-nourished.  Obese female  HENT:  Head: Normocephalic and atraumatic.  Eyes: EOM are normal.  Cardiovascular: Normal rate, regular rhythm, normal heart sounds and intact distal pulses.  Pulmonary/Chest: Effort normal and breath sounds normal. No respiratory distress.  Abdominal: Soft. Bowel sounds are normal. She exhibits no distension. There is no tenderness.  Musculoskeletal: She exhibits no edema or deformity.  Neurological: She is alert.  Skin: Skin is warm and dry.    Assessment & Plan:   See Encounters Tab for problem based charting.  Patient discussed with Dr. Daryll Drown

## 2017-03-06 NOTE — Assessment & Plan Note (Addendum)
BP today 135/69 on recheck (initial 150/78). Last BP 123/68. Remains well controlled. Will continue current medications. If constantly over 130, will consider increasing Combo-pill to HCTZ portion to 25 - Accuretic 20-12.5 Daily - Propranolol 20mg  BID - Lifestyle modifications (diet and exercise)

## 2017-03-06 NOTE — Assessment & Plan Note (Addendum)
A1c today 9.8, which is up from Last A1c of 8.9. She states that she has done a poor job with her diet and exercise over the holidays and has also not been keeping a log of her blood sugar. We discussed increasing her dose of insulin. She remains resistant to increasing her medications due to cost, preferring to give lifestyle modifications another try. At her follow up in three months; If her A1c is not improving, she has agreed to increase her insulin at that time. We will continue current regimen and recheck in 3 months. - A1C today: 9.8 - Continue metformin, Januvia and glipizide - Continue Basaglar 10 units qhs - Continue Blood sugar log - Lifestyle modifcations - Return to clinic in 3 months

## 2017-03-07 ENCOUNTER — Encounter: Payer: Self-pay | Admitting: Internal Medicine

## 2017-03-07 ENCOUNTER — Ambulatory Visit (INDEPENDENT_AMBULATORY_CARE_PROVIDER_SITE_OTHER): Payer: BLUE CROSS/BLUE SHIELD | Admitting: Internal Medicine

## 2017-03-07 VITALS — BP 135/69 | HR 99 | Temp 98.6°F | Wt 218.9 lb

## 2017-03-07 DIAGNOSIS — Z79899 Other long term (current) drug therapy: Secondary | ICD-10-CM

## 2017-03-07 DIAGNOSIS — E1169 Type 2 diabetes mellitus with other specified complication: Secondary | ICD-10-CM | POA: Diagnosis not present

## 2017-03-07 DIAGNOSIS — I10 Essential (primary) hypertension: Secondary | ICD-10-CM

## 2017-03-07 DIAGNOSIS — Z794 Long term (current) use of insulin: Secondary | ICD-10-CM | POA: Diagnosis not present

## 2017-03-07 DIAGNOSIS — G43009 Migraine without aura, not intractable, without status migrainosus: Secondary | ICD-10-CM

## 2017-03-07 DIAGNOSIS — Z6841 Body Mass Index (BMI) 40.0 and over, adult: Secondary | ICD-10-CM

## 2017-03-07 DIAGNOSIS — E785 Hyperlipidemia, unspecified: Secondary | ICD-10-CM

## 2017-03-07 DIAGNOSIS — E669 Obesity, unspecified: Secondary | ICD-10-CM | POA: Diagnosis not present

## 2017-03-07 DIAGNOSIS — Z0000 Encounter for general adult medical examination without abnormal findings: Secondary | ICD-10-CM

## 2017-03-07 LAB — POCT GLYCOSYLATED HEMOGLOBIN (HGB A1C): Hemoglobin A1C: 9.8

## 2017-03-07 LAB — GLUCOSE, CAPILLARY: Glucose-Capillary: 314 mg/dL — ABNORMAL HIGH (ref 65–99)

## 2017-03-07 MED ORDER — ELETRIPTAN HYDROBROMIDE 20 MG PO TABS: ORAL_TABLET | ORAL | 0 refills | Status: DC

## 2017-03-07 NOTE — Addendum Note (Signed)
Addended by: [name] B on: 03/07/2017 04:16 PM   Modules accepted: Orders

## 2017-03-07 NOTE — Assessment & Plan Note (Signed)
Patient due for referral to GI for Screening Colonoscopy - GI referral

## 2017-03-07 NOTE — Assessment & Plan Note (Addendum)
Patient with history of hyperlipidemia on Atorvastatin 20mg  Daily. Last Lipid panel (2014): LDL-76, total-130. Patient is compliant with medication. We discussed rechecking her cholesterol, but she chose to defer this lab until her follow up in 3 months. - Recheck Lipid panel in 3 months

## 2017-03-07 NOTE — Assessment & Plan Note (Addendum)
Patient continues to experience migraine's about twice/month. She states that she wants to continue with abortive therapy and needs a refill today - Refill Eletriptan 20mg  #10

## 2017-03-07 NOTE — Patient Instructions (Addendum)
Thank you for allowing [name] to care for you  For your diabetes: - A1C was increased today from 8.9 to 9.8 - Continue to take your medications as prescribed - Work hard to get back on track with diet and exercise - We will defer medication changes until your next visit - Return for follow up in thee months  For you Hypertension: - Blood pressure was up a little bit today at 135/69 - Continue to take medications as prescibed  For your Migraines - You have been provided a refill for Relpax  You have been provided with a referral to GI for screening colonoscopy.  We will check your cholesterol at your next visit.  Follow up in 3 months

## 2017-03-08 NOTE — Progress Notes (Signed)
Internal Medicine Clinic Attending  Case discussed with Dr. Melvin  at the time of the visit.  We reviewed the resident's history and exam and pertinent patient test results.  I agree with the assessment, diagnosis, and plan of care documented in the resident's note.  

## 2017-03-14 ENCOUNTER — Encounter: Payer: Self-pay | Admitting: *Deleted

## 2017-03-18 ENCOUNTER — Emergency Department (HOSPITAL_COMMUNITY): Payer: BLUE CROSS/BLUE SHIELD

## 2017-03-18 ENCOUNTER — Other Ambulatory Visit: Payer: Self-pay

## 2017-03-18 ENCOUNTER — Encounter (HOSPITAL_COMMUNITY): Payer: Self-pay

## 2017-03-18 ENCOUNTER — Emergency Department (HOSPITAL_COMMUNITY)
Admission: EM | Admit: 2017-03-18 | Discharge: 2017-03-18 | Disposition: A | Discharge status: Home / Self Care | Payer: BLUE CROSS/BLUE SHIELD | Attending: Emergency Medicine | Admitting: Emergency Medicine

## 2017-03-18 DIAGNOSIS — Z794 Long term (current) use of insulin: Secondary | ICD-10-CM | POA: Diagnosis not present

## 2017-03-18 DIAGNOSIS — J45909 Unspecified asthma, uncomplicated: Secondary | ICD-10-CM | POA: Diagnosis not present

## 2017-03-18 DIAGNOSIS — I10 Essential (primary) hypertension: Secondary | ICD-10-CM | POA: Insufficient documentation

## 2017-03-18 DIAGNOSIS — R0602 Shortness of breath: Secondary | ICD-10-CM | POA: Diagnosis not present

## 2017-03-18 DIAGNOSIS — Z79899 Other long term (current) drug therapy: Secondary | ICD-10-CM | POA: Insufficient documentation

## 2017-03-18 DIAGNOSIS — E119 Type 2 diabetes mellitus without complications: Secondary | ICD-10-CM | POA: Insufficient documentation

## 2017-03-18 LAB — BASIC METABOLIC PANEL
Anion gap: 11 (ref 5–15)
BUN: 8 mg/dL (ref 6–20)
CO2: 21 mmol/L — ABNORMAL LOW (ref 22–32)
Calcium: 9.2 mg/dL (ref 8.9–10.3)
Chloride: 103 mmol/L (ref 101–111)
Creatinine, Ser: 0.73 mg/dL (ref 0.44–1.00)
GFR calc Af Amer: >60 mL/min (ref >60)
GFR calc non Af Amer: >60 mL/min (ref >60)
Glucose, Bld: 307 mg/dL — ABNORMAL HIGH (ref 65–99)
Potassium: 4.3 mmol/L (ref 3.5–5.1)
Sodium: 135 mmol/L (ref 135–145)

## 2017-03-18 LAB — CBC
HCT: 36.4 % (ref 36.0–46.0)
Hemoglobin: 12.4 g/dL (ref 12.0–15.0)
MCH: 32.6 pg (ref 26.0–34.0)
MCHC: 34.1 g/dL (ref 30.0–36.0)
MCV: 95.8 fL (ref 78.0–100.0)
Platelets: 315 10*3/uL (ref 150–400)
RBC: 3.8 MIL/uL — ABNORMAL LOW (ref 3.87–5.11)
RDW: 12.4 % (ref 11.5–15.5)
WBC: 8.2 10*3/uL (ref 4.0–10.5)

## 2017-03-18 LAB — I-STAT TROPONIN, ED
Troponin i, poc: 0.01 ng/mL (ref 0.00–0.08)
Troponin i, poc: 0.01 ng/mL (ref 0.00–0.08)

## 2017-03-18 LAB — I-STAT BETA HCG BLOOD, ED (MC, WL, AP ONLY): I-stat hCG, quantitative: <5 m[IU]/mL (ref <5)

## 2017-03-18 MED ORDER — ACETAMINOPHEN 500 MG PO TABS
1000.0000 mg | ORAL_TABLET | Freq: Once | ORAL | Status: AC
  Administered 2017-03-18: 1000 mg via ORAL
  Filled 2017-03-18: qty 2

## 2017-03-18 NOTE — ED Triage Notes (Signed)
Pt presents to the ed with complaints of chest pain and shortness of breath that started this morning. Reports having sudden rapid breathing at home this morning that subsided, once she got to work she started having a pressure in her chest that has subsided and is now feeling short of breath again.

## 2017-03-18 NOTE — ED Provider Notes (Signed)
[facility] DEPART[facility] Provider Note   CSN: [id number] Arrival date & time: 03/18/17  0805     History   Chief Complaint Chief Complaint  Patient presents with  . Shortness of Breath    HPI Sandra [name] is a 52 y.o. female.  HPI Patient is a 52 year old female presents the emergency department after having some mild chest discomfort and shortness of breath which occurred at home this morning and then subsided.  She went on to work and was feeling a normal then developed chest discomfort again which was transient and sharp in nature to feel short of breath.  She now remains asymptomatic again.  No history of heart disease.  No history of PE or DVT.  No recent surgery or no family history of early cardiac disease.  Asymptomatic at this time.  No fevers or chills.  Some cough.    Past Medical History:  Diagnosis Date  . Asthma    as a child  . Carpal tunnel syndrome   . Diabetes mellitus type 2, uncontrolled (HCC) DX: 1999   Started as gestational diabetes  . ESSENTIAL HYPERTENSION 07/08/2006  . HSV (herpes simplex virus) anogenital infection   . HYPERLIPIDEMIA 08/20/2007  . Menorrhagia   . Migraine    Normal CT head (08/06/2006)  . Swelling of abdominal wall 03/02/2014    Patient Active Problem List   Diagnosis Date Noted  . Pain in right paraspinal region 03/02/2014  . Swelling of abdominal wall 03/02/2014  . Psoriasis 04/29/2013  . Adenomyosis 06/03/2012  . Obesity 05/16/2012  . Decreased ejection fraction 02/22/2012  . Perimenopausal menorrhagia 02/20/2012  . LBBB (left bundle branch block) 01/24/2012  . Routine adult health maintenance 09/27/2010  . Diabetes mellitus type 2 in obese ([place]) 11/17/2009  . Hyperlipidemia 08/20/2007  . Migraine without aura 09/02/2006  . Essential hypertension 07/08/2006  . HSV 11/07/2005  . ASTHMA 11/07/2005    Past Surgical History:  Procedure Laterality Date  . CHOLECYSTECTOMY  2009  . TUBAL  LIGATION      OB History    Gravida Para Term Preterm AB Living   2 1 1   1 1    SAB TAB Ectopic Multiple Live Births     1             Home Medications    Prior to Admission medications   Medication Sig Start Date End Date Taking? Authorizing Provider  acetaminophen (TYLENOL) 500 MG tablet Take 1,000 mg by mouth every 6 (six) hours as needed for moderate pain or headache.   Yes [provider]  Alpha-Lipoic Acid 300 MG TABS Take 300 mg by mouth 2 (two) times daily.   Yes [provider]  atorvastatin (LIPITOR) 20 MG tablet Take 1 tablet (20 mg total) by mouth daily at 6 PM. 09/14/16  Yes Granfortuna, Alyson Locket, MD  BIOTIN PO Take 500 mg by mouth daily.   Yes [provider]  CINNAMON PO Take 1,000 mg by mouth 2 (two) times daily.   Yes [provider]  co-enzyme Q-10 30 MG capsule Take 30 mg by mouth daily.   Yes [provider]  dextromethorphan-guaiFENesin (MUCINEX DM) 30-600 MG 12hr tablet Take 1 tablet 2 (two) times daily by mouth. 11/30/16  Yes Maryellen Pile, MD  eletriptan (RELPAX) 20 MG tablet TAKE ONE TABLET BY MOUTH AS NEEDED FOR MIGRAINE OR HEADACHE. MAY REPEAT IN 2 HOURS IF HEADACHE PERSISTS OR RECURS. 03/07/17  Yes [name],  MD  fluticasone (FLONASE) 50 MCG/ACT nasal spray Place 1 spray into both nostrils daily. Patient taking differently: Place 1 spray into both nostrils as needed for allergies.  12/27/16 03/18/17 Yes [name], MD  glipiZIDE (GLUCOTROL) 10 MG tablet Take 1 tablet (10 mg total) by mouth 2 (two) times daily before a meal. 02/22/17 04/03/18 Yes [name], MD  ibuprofen (ADVIL,MOTRIN) 200 MG tablet Take 200 mg by mouth every 6 (six) hours as needed for mild pain.   Yes [provider]  Insulin Glargine (BASAGLAR KWIKPEN) 100 UNIT/ML SOPN Inject 0.1 mLs (10 Units total) into the skin at bedtime. 06/12/16  Yes Ophelia Shoulder, MD  magnesium oxide (MAG-OX) 400 MG tablet Take 400 mg by mouth daily.    Yes [provider]  metFORMIN (GLUCOPHAGE-XR) 500 MG 24 hr tablet Take 2 tablets (1,000 mg total) by mouth 2 (two) times daily. 10/26/16  Yes Bartholomew Crews, MD  naproxen (NAPROSYN) 500 MG tablet Take 1 tablet (500 mg total) by mouth 2 (two) times daily with a meal. 12/28/16 12/28/17 Yes [name], MD  promethazine (PHENERGAN) 12.5 MG tablet Take 1 tablet (12.5 mg total) by mouth every 6 (six) hours as needed for nausea. 02/24/16  Yes Zada Finders, MD  propranolol (INDERAL) 20 MG tablet Take 1 tablet (20 mg total) by mouth 2 (two) times daily. 10/26/16  Yes Bartholomew Crews, MD  quinapril-hydrochlorothiazide (ACCURETIC) 20-12.5 MG tablet Take 1 tablet daily by mouth. 12/03/16  Yes Lucious Groves, DO  sitaGLIPtin (JANUVIA) 100 MG tablet Take 1 tablet (100 mg total) daily by mouth. 12/03/16  Yes Lucious Groves, DO  vitamin B-12 (CYANOCOBALAMIN) 1000 MCG tablet Take 1,000 mcg by mouth daily.   Yes [provider]  albuterol (PROVENTIL HFA;VENTOLIN HFA) 108 (90 Base) MCG/ACT inhaler Inhale 1-2 puffs into the lungs every 6 (six) hours as needed for wheezing or shortness of breath. Patient not taking: Reported on 03/18/2017 04/08/15   Drenda Freeze, MD  ibuprofen (ADVIL,MOTRIN) 600 MG tablet Take 1 tablet (600 mg total) by mouth every 6 (six) hours as needed. Patient not taking: Reported on 03/18/2017 02/21/16   Shary Decamp, PA-C  Insulin Pen Needle (B-D UF III MINI PEN NEEDLES) 31G X 5 MM MISC 1 Stick by Does not apply route daily. 11.69, insulin requirement 06/07/16   Ophelia Shoulder, MD    Family History Family History  Problem Relation Age of Onset  . Hypertension Father   . Diabetes Father     Social History Social History   Tobacco Use  . Smoking status: Never Smoker  . Smokeless tobacco: Never Used  Substance Use Topics  . Alcohol use: No    Alcohol/week: 0.0 oz  . Drug use: No     Allergies   Patient has no known allergies.   Review of  Systems Review of Systems  All other systems reviewed and are negative.    Physical Exam Updated Vital Signs BP 131/71   Pulse 73   Temp 98.8 F (37.1 C) (Oral)   Resp 17   Wt 98.9 kg (218 lb)   SpO2 98%   BMI 42.58 kg/m   Physical Exam  Constitutional: She is oriented to person, place, and time. She appears well-developed and well-nourished. No distress.  HENT:  Head: Normocephalic and atraumatic.  Eyes: EOM are normal.  Neck: Normal range of motion.  Cardiovascular: Normal rate, regular rhythm and normal heart sounds.  Pulmonary/Chest: Effort normal and breath sounds normal.  Abdominal: Soft. She exhibits no distension. There is no tenderness.  Musculoskeletal: Normal range of motion.       Right lower leg: She exhibits no tenderness and no edema.       Left lower leg: She exhibits no tenderness and no edema.  Neurological: She is alert and oriented to person, place, and time.  Skin: Skin is warm and dry.  Psychiatric: She has a normal mood and affect. Judgment normal.  Nursing note and vitals reviewed.    ED Treatments / Results  Labs (all labs ordered are listed, but only abnormal results are displayed) Labs Reviewed  BASIC METABOLIC PANEL - Abnormal; Notable for the following components:      Result Value   CO2 21 (*)    Glucose, Bld 307 (*)    All other components within normal limits  CBC - Abnormal; Notable for the following components:   RBC 3.80 (*)    All other components within normal limits  I-STAT TROPONIN, ED  I-STAT BETA HCG BLOOD, ED ([facility], [facility], [facility] ONLY)  I-STAT TROPONIN, ED    EKG  EKG Interpretation  Date/Time:  Monday March 18 2017 08:08:51 EST Ventricular Rate:  90 PR Interval:  150 QRS Duration: 128 QT Interval:  410 QTC Calculation: 501 R Axis:   110 Text Interpretation:  Normal sinus rhythm Right axis deviation Non-specific intra-ventricular conduction block T wave abnormality, consider inferolateral ischemia Abnormal ECG No  significant change was found Confirmed by Jola Schmidt [phone]) on 03/18/2017 10:13:02 AM       Radiology Dg Chest 2 View  Result Date: 03/18/2017 CLINICAL DATA:  Shortness of breath, right-sided chest pain, weakness, dizziness EXAM: CHEST  2 VIEW COMPARISON:  02/23/2016 FINDINGS: The heart size and mediastinal contours are within normal limits. Both lungs are clear. The visualized skeletal structures are unremarkable. IMPRESSION: No active cardiopulmonary disease. Electronically Signed   By: Kathreen Devoid   On: 03/18/2017 08:55    Procedures Procedures (including critical care time)  Medications Ordered in ED Medications  acetaminophen (TYLENOL) tablet 1,000 mg (1,000 mg Oral Given 03/18/17 1134)     Initial Impression / Assessment and Plan / ED Course  I have reviewed the triage vital signs and the nursing notes.  Pertinent labs & imaging results that were available during my care of the patient were reviewed by me and considered in my medical decision making (see chart for details).     Overall well-appearing.  Asymptomatic at this time.  Vital signs without significant abnormality.  Patient will need outpatient cardiac and primary care follow-up.  She may benefit from echo and stress test.  She understands this.  She does have the number for cardiology team.  She understands return to the ER for new or worsening symptoms.  Final Clinical Impressions(s) / ED Diagnoses   Final diagnoses:  SOB (shortness of breath)    ED Discharge Orders    None       Jola Schmidt, MD 03/18/17 [phone]

## 2017-03-18 NOTE — ED Notes (Signed)
Pt stable, ambulatory, states understanding of discharge instructions 

## 2017-03-20 ENCOUNTER — Ambulatory Visit (INDEPENDENT_AMBULATORY_CARE_PROVIDER_SITE_OTHER): Payer: BLUE CROSS/BLUE SHIELD | Admitting: Internal Medicine

## 2017-03-20 ENCOUNTER — Other Ambulatory Visit: Payer: Self-pay

## 2017-03-20 VITALS — BP 138/75 | HR 88 | Temp 98.6°F | Ht 60.0 in | Wt 229.0 lb

## 2017-03-20 DIAGNOSIS — Z6841 Body Mass Index (BMI) 40.0 and over, adult: Secondary | ICD-10-CM | POA: Diagnosis not present

## 2017-03-20 DIAGNOSIS — R0602 Shortness of breath: Secondary | ICD-10-CM

## 2017-03-20 DIAGNOSIS — Z9189 Other specified personal risk factors, not elsewhere classified: Secondary | ICD-10-CM

## 2017-03-20 DIAGNOSIS — E119 Type 2 diabetes mellitus without complications: Secondary | ICD-10-CM | POA: Diagnosis not present

## 2017-03-20 DIAGNOSIS — I420 Dilated cardiomyopathy: Secondary | ICD-10-CM | POA: Insufficient documentation

## 2017-03-20 DIAGNOSIS — E785 Hyperlipidemia, unspecified: Secondary | ICD-10-CM

## 2017-03-20 DIAGNOSIS — R0981 Nasal congestion: Secondary | ICD-10-CM

## 2017-03-20 DIAGNOSIS — G43909 Migraine, unspecified, not intractable, without status migrainosus: Secondary | ICD-10-CM | POA: Diagnosis not present

## 2017-03-20 DIAGNOSIS — I10 Essential (primary) hypertension: Secondary | ICD-10-CM | POA: Diagnosis not present

## 2017-03-20 HISTORY — DX: Dilated cardiomyopathy: I42.0

## 2017-03-20 MED ORDER — ALBUTEROL SULFATE HFA 108 (90 BASE) MCG/ACT IN AERS: 1.0000 | INHALATION_SPRAY | Freq: Four times a day (QID) | RESPIRATORY_TRACT | 0 refills | Status: DC | PRN

## 2017-03-20 NOTE — Assessment & Plan Note (Signed)
On Monday morning as she ws getting ready for work she began having shortness of breath, the difficulty breathing became worse throughout the day. The difficulty breathing was associated with chest pain that lasted less than 10 minutes, nausea, and dizziness with standing. Chest pain was over right breast, nagging, non radiating, not exacerbated by deep breathing or exertion, and resolved spontaneously. Hours after the chest pain resolved she realized she was feeling dizzy and nauseous. She has a chest xray which showed no acute cardiopulmonary disease, neg troponinx2, normal CBC, and BMP with normal renal function and EKG with T wave inversions and ST depression in lateral leads which are new from prior EKG in the ED.The shortness resolved spontaneously in the emergency department. She has a history of asthma during childhood and has coughing and wheezing from time to time but notes that she did not have wheezing at the time. She questions if this may have been due to a panic attack but she was was not anxious and did not have palpitations or diaphoresis at the time and has not had stress in her life recently. She has had no further symptoms since Monday. Denies new medications besides vitamins that she started one month prior. Prior to Monday she did not have chest pain with exertion but does feel winded with exertion, of note she did not have DOE prior to 6 months ago when she was working out a lot.  Symptoms could represent atypical chest pain especially in a woman with history of uncontrolled diabetes. Although the chest pain is atypical she has multiple risk factors for cardiac disease including HLD, uncontrolled diabetes, morbid obesity. Mother had a CABG around age 52. In terms of risk factor modification Last LDL was 73 on lipid panel in 2014, she is taking atorvastatin 20 mg daily. Diabetes is not controlled, last A1c was 9.8 but PCP has plans for better control of this. The other possible cause of these  symptoms could be asthma.  - prescribe albuterol PRN - referral to cardiology for stress testing  - continue atorvastatin and diabetes management

## 2017-03-20 NOTE — Patient Instructions (Signed)
Thank you for coming to the clinic today. It was a pleasure to see you.   You should hear back about scheduling the cardiologist visit within the next week  Try taking albuterol when you develop wheezing or shortness of breath  FOLLOW-UP INSTRUCTIONS When: May with Dr. Trilby Drummer   For: check up of your diabetes  What to bring: all of your medication bottles   Please call our clinic if you have any questions or concerns, we may be able to help and keep you from a long and expensive emergency room wait. Our clinic and after hours phone number is [phone], there is always someone available.

## 2017-03-20 NOTE — Progress Notes (Deleted)
   CC: follow up after ED visit for chest discomfort and shortness of breath  HPI:  [name] is a 52 y.o. with PMH hypertension, hyperlipidemia, diabetes and migraines who presents for follow up after ED visit for chest discomfort and shortness of breath. Please see the assessment and plans for the status of the patient chronic medical problems.    Past Medical History:  Diagnosis Date  . Asthma    as a child  . Carpal tunnel syndrome   . Diabetes mellitus type 2, uncontrolled (HCC) DX: 1999   Started as gestational diabetes  . ESSENTIAL HYPERTENSION 07/08/2006  . HSV (herpes simplex virus) anogenital infection   . HYPERLIPIDEMIA 08/20/2007  . Menorrhagia   . Migraine    Normal CT head (08/06/2006)  . Swelling of abdominal wall 03/02/2014   Review of Systems:  Refer to history of present illness and assessment and plans for pertinent review of systems, all others reviewed and negative  Physical Exam:  Vitals:   03/20/17 1520  BP: 138/75  Pulse: 88  Temp: 98.6 F (37 C)  TempSrc: Oral  SpO2: 99%  Weight: 229 lb (103.9 kg)  Height: 5' (1.524 m)   General: well appearing HEENT: sinus congestion  Pulm: Decreased breath sounds moreso over the right lung field, actively coughing, no rhonchi or wheezes   Assessment & Plan:   Shortness of breath  On Monday morning as she ws getting ready for work she began having shortness of breath, the difficulty breathing became worse throughout the day. The difficulty breathing was associated with chest pain that lasted less than 10 minutes, nausea, and dizziness with standing. Chest pain was over left breast, nagging, non radiating, not exacerbated by deep breathing or exertion. Hours after the chest pain resolved she realized she was feeling dizzy and nauseous. She has a *** chest xray, labs, and EKG in the ED.The shortness resolved spontaneously in the emergency department. She has a history of asthma during childhood and has  coughing and wheezing from time to time but notes that she did not have wheezing at the time. She questions if this may have been due to a panic attack but she was was not anxious and did not have palpitations or diaphoresis at the time and has not had stress in her life recently. She has had no further symptoms since Monday. Denies new medications besides vitamins that she started one month prior. Prior to Monday she did not have chest pain with exertion but does feel winded with exertion, of note she did not have DOE prior to 6 months ago when she was working out a lot.  Focus on modification of cardiac risk factors.  Last LDL was 73 on lipid panel in 2014, she is taking atorvastatin 20 mg daily. Diabetes is not controlled, last A1c was 9.8. Mother had a CABG at around age 10.  - prescribe albuterol PRN  See Encounters Tab for problem based charting.  Patient {GC/GE:[id number]::"discussed with","seen with"} Dr. {NAMES:[id number]::"Butcher","Granfortuna","E. Hoffman","Klima","Mullen","Narendra","Raines","Vincent"}

## 2017-03-20 NOTE — Progress Notes (Signed)
CC: follow up after ED visit for chest discomfort and shortness of breath  HPI:  [name] is a 52 y.o. with PMH hypertension, hyperlipidemia, diabetes and migraines who presents for follow up after ED visit for chest discomfort and shortness of breath. Please see the assessment and plans for the status of the patient chronic medical problems.    Past Medical History:  Diagnosis Date  . Asthma    as a child  . Carpal tunnel syndrome   . Diabetes mellitus type 2, uncontrolled (HCC) DX: 1999   Started as gestational diabetes  . ESSENTIAL HYPERTENSION 07/08/2006  . HSV (herpes simplex virus) anogenital infection   . HYPERLIPIDEMIA 08/20/2007  . Menorrhagia   . Migraine    Normal CT head (08/06/2006)  . Swelling of abdominal wall 03/02/2014   Review of Systems:  Refer to history of present illness and assessment and plans for pertinent review of systems, all others reviewed and negative  Physical Exam:  Vitals:   03/20/17 1520  BP: 138/75  Pulse: 88  Temp: 98.6 F (37 C)  TempSrc: Oral  SpO2: 99%  Weight: 229 lb (103.9 kg)  Height: 5' (1.524 m)   General: well appearing HEENT: sinus congestion  Cardiac:  Regular rate and rhythm, no murmur, no peripheral edema  Pulm: Decreased breath sounds more so over the right lung field, actively coughing, no rhonchi or wheezes   Assessment & Plan:   Shortness of breath  On Monday morning as she ws getting ready for work she began having shortness of breath, the difficulty breathing became worse throughout the day. The difficulty breathing was associated with chest pain that lasted less than 10 minutes, nausea, and dizziness with standing. Chest pain was over right breast, nagging, non radiating, not exacerbated by deep breathing or exertion, and resolved spontaneously. Hours after the chest pain resolved she realized she was feeling dizzy and nauseous. She has a chest xray which showed no acute cardiopulmonary disease, neg  troponinx2, normal CBC, and BMP with normal renal function and EKG with T wave inversions and ST depression in lateral leads which are new from prior EKG in the ED.The shortness resolved spontaneously in the emergency department. She has a history of asthma during childhood and has coughing and wheezing from time to time but notes that she did not have wheezing at the time. She questions if this may have been due to a panic attack but she was was not anxious and did not have palpitations or diaphoresis at the time and has not had stress in her life recently. She has had no further symptoms since Monday. Denies new medications besides vitamins that she started one month prior. Prior to Monday she did not have chest pain with exertion but does feel winded with exertion, of note she did not have DOE prior to 6 months ago when she was working out a lot.  Symptoms could represent atypical chest pain especially in a woman with history of uncontrolled diabetes. Although the chest pain is atypical she has multiple [name] factors for cardiac disease including HLD, uncontrolled diabetes, morbid obesity. Mother had a CABG around age 70. In terms of [name] factor modification Last LDL was 73 on lipid panel in 2014, she is taking atorvastatin 20 mg daily. Diabetes is not controlled, last A1c was 9.8 but PCP has plans for better control of this. The other possible cause of these symptoms could be asthma.  - prescribe albuterol PRN - referral to cardiology  for stress testing  - continue atorvastatin and diabetes management   See Encounters Tab for problem based charting.  Patient discussed with Dr. Dareen Piano

## 2017-03-21 MED ORDER — ALBUTEROL SULFATE HFA 108 (90 BASE) MCG/ACT IN AERS: 1.0000 | INHALATION_SPRAY | Freq: Four times a day (QID) | RESPIRATORY_TRACT | 0 refills | Status: DC | PRN

## 2017-03-21 NOTE — Addendum Note (Signed)
Addended by: Meryl Dare on: 03/21/2017 05:30 PM   Modules accepted: Orders

## 2017-03-21 NOTE — Progress Notes (Signed)
Internal Medicine Clinic Attending  Case discussed with Dr. Blum at the time of the visit.  We reviewed the resident's history and exam and pertinent patient test results.  I agree with the assessment, diagnosis, and plan of care documented in the resident's note. 

## 2017-04-05 NOTE — Addendum Note (Signed)
Addended by: Hulan Fray on: 04/05/2017 06:11 PM   Modules accepted: Orders

## 2017-04-16 ENCOUNTER — Encounter: Payer: Self-pay | Admitting: Cardiovascular Disease

## 2017-04-24 NOTE — Progress Notes (Signed)
Cardiology Office Note   Date:  04/26/2017   ID:  Sandra [name], DOB 06-Aug-1965, MRN [id number]  PCP:  Sandra Seat, MD  Cardiologist:   Jenkins Rouge, MD   No chief complaint on file.     History of Present Illness: Sandra [name] is a 52 y.o. female who presents for consultation regarding dyspnea. Referred by Sandra [name]. Reviewed ER note from 03/18/17  She was seen for atypical transient sharp SSCP and dyspnea. Waxed/Waned with recurrent episodes. She r/o Hct 36.4 BS was over 300. CXR with NAD CRF;s include HTN, HLD and DM>    I evaluated her in 2014 for abnormal ECG with LVH ICLBBB.  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 Asthma    as a child  . Carpal tunnel syndrome   . Diabetes mellitus type 2, uncontrolled (HCC) DX: 1999   Started as gestational diabetes  . ESSENTIAL HYPERTENSION 07/08/2006  . HSV (herpes simplex virus) anogenital infection   . HYPERLIPIDEMIA 08/20/2007  . Menorrhagia   . Migraine    Normal CT head (08/06/2006)  . Swelling of abdominal wall 03/02/2014    Past Surgical History:  Procedure Laterality Date  . CHOLECYSTECTOMY  2009  . TUBAL LIGATION       Current Outpatient Medications  Medication Sig Dispense Refill  . albuterol (PROVENTIL HFA;VENTOLIN HFA) 108 (90 Base) MCG/ACT inhaler Inhale 1-2 puffs into the lungs every 6 (six) hours as needed for wheezing or shortness of breath. 1 Inhaler 0  . Alpha-Lipoic Acid 300 MG TABS Take 300 mg by mouth 2 (two) times daily.    Marland Kitchen atorvastatin (LIPITOR) 20 MG tablet Take 1 tablet (20 mg total) by mouth daily at 6 PM. 30 tablet 6  . BIOTIN PO Take 500 mg by mouth daily.    Marland Kitchen CINNAMON PO Take 1,000 mg  by mouth 2 (two) times daily.    Marland Kitchen co-enzyme Q-10 30 MG capsule Take 30 mg by mouth daily.    Marland Kitchen eletriptan (RELPAX) 20 MG tablet TAKE ONE TABLET BY MOUTH AS NEEDED FOR MIGRAINE OR HEADACHE. MAY REPEAT IN 2 HOURS IF HEADACHE PERSISTS OR RECURS. 10 tablet 0  . fluticasone (FLONASE) 50 MCG/ACT nasal spray Place into both nostrils as directed.    Marland Kitchen glipiZIDE (GLUCOTROL) 10 MG tablet Take 1 tablet (10 mg total) by mouth 2 (two) times daily before a meal. 180 tablet 3  . ibuprofen (ADVIL,MOTRIN) 600 MG tablet Take 1 tablet (600 mg total) by mouth every 6 (six) hours as needed. 30 tablet 0  . Insulin Glargine (BASAGLAR KWIKPEN) 100 UNIT/ML SOPN Inject 0.1 mLs (10 Units total) into the skin at bedtime. 15 mL 11  . Insulin Pen Needle (B-D UF III MINI PEN NEEDLES) 31G X 5 MM MISC 1 Stick by Does not apply route daily. 11.69, insulin requirement 30 each 3  . magnesium oxide (MAG-OX) 400 MG tablet Take 400 mg by mouth daily.    . metFORMIN (GLUCOPHAGE-XR) 500 MG 24 hr tablet Take 2 tablets (1,000 mg total) by mouth 2 (two) times daily. 360 tablet 3  . naproxen (NAPROSYN) 500 MG tablet  Take 1 tablet (500 mg total) by mouth 2 (two) times daily with a meal. 60 tablet 0  . propranolol (INDERAL) 20 MG tablet Take 1 tablet (20 mg total) by mouth 2 (two) times daily. 180 tablet 3  . quinapril-hydrochlorothiazide (ACCURETIC) 20-12.5 MG tablet Take 1 tablet daily by mouth. 90 tablet 1  . sitaGLIPtin (JANUVIA) 100 MG tablet Take 1 tablet (100 mg total) daily by mouth. 90 tablet 1  . vitamin B-12 (CYANOCOBALAMIN) 1000 MCG tablet Take 1,000 mcg by mouth daily.    Marland Kitchen acetaminophen (TYLENOL) 500 MG tablet Take 1,000 mg by mouth every 6 (six) hours as needed for moderate pain or headache.    . dextromethorphan-guaiFENesin (MUCINEX DM) 30-600 MG 12hr tablet Take 1 tablet 2 (two) times daily by mouth. (Patient not taking: Reported on 04/26/2017) 10 tablet 0  . ibuprofen (ADVIL,MOTRIN) 200 MG tablet Take 200 mg by mouth every 6  (six) hours as needed for mild pain.    . promethazine (PHENERGAN) 12.5 MG tablet Take 1 tablet (12.5 mg total) by mouth every 6 (six) hours as needed for nausea. (Patient not taking: Reported on 04/26/2017) 12 tablet 0   No current facility-administered medications for this visit.     Allergies:   Patient has no known allergies.    Social History:  The patient  reports that she has never smoked. She has never used smokeless tobacco. She reports that she does not drink alcohol or use drugs.   Family History:  The patient's family history includes Diabetes in her father; Hypertension in her father.    ROS:  Please see the history of present illness.   Otherwise, review of systems are positive for none.   All other systems are reviewed and negative.    PHYSICAL EXAM: VS:  BP (!) 148/86   Pulse 92   Ht 5' (1.524 m)   Wt 214 lb 12.8 oz (97.4 kg)   SpO2 98%   BMI 41.95 kg/m  , BMI Body mass index is 41.95 kg/m. Affect appropriate Healthy:  appears stated age 16: normal Neck supple with no adenopathy JVP normal no bruits no thyromegaly Lungs clear with no wheezing and good diaphragmatic motion Heart:  S1/S2 no murmur, no rub, gallop or click PMI normal Abdomen: benighn, BS positve, no tenderness, no AAA no bruit.  No HSM or HJR Distal pulses intact with no bruits No edema Neuro non-focal Skin warm and dry No muscular weakness    EKG:  03/18/17  SR rate 90 LBBB 04/26/17 SR IVCD LBBB type LVH   Recent Labs: 03/18/2017: BUN 8; Creatinine, Ser 0.73; Hemoglobin 12.4; Platelets 315; Potassium 4.3; Sodium 135    Lipid Panel    Component Value Date/Time   CHOL 130 05/29/2012 1512   TRIG 105 05/29/2012 1512   HDL 33 (L) 05/29/2012 1512   CHOLHDL 3.9 05/29/2012 1512   VLDL 21 05/29/2012 1512   LDLCALC 76 05/29/2012 1512      Wt Readings from Last 3 Encounters:  04/26/17 214 lb 12.8 oz (97.4 kg)  03/20/17 229 lb (103.9 kg)  03/18/17 218 lb (98.9 kg)      Other  studies Reviewed: Additional studies/ records that were reviewed today include: Notes cardiology 2014 With MRI/myovue/Echo notes from ER 02/2017 labs CXR ECG and doctor notes.    ASSESSMENT AND PLAN:  1.  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 Dyspnea:  with history of variable / low EF f/u echo to reassess no BNP Done in ER but CXR with no CHF f/u TTE for starters  3. HTN:  Well controlled.  Continue current medications and low sodium Dash type diet.   4. HLD: continue statin labs with primary  5. DM:  Discussed low carb diet.  Target hemoglobin A1c is 6.5 or less.  Continue current medications.    Current medicines are reviewed at length with the patient today.  The patient does not have concerns regarding medicines.  The following changes have been made:  none  Labs/ tests ordered today include: TTE Ex Myovue   Orders Placed This Encounter  Procedures  . MYOCARDIAL PERFUSION IMAGING  . ECHOCARDIOGRAM COMPLETE     Disposition:   FU with me in a year if EF normal and Myovue non ischemic      Signed, Jenkins Rouge, MD  04/26/2017 4:04 PM    [facility] [address], [place], [place]  [address] Phone: [phone]; Fax: [phone]

## 2017-04-26 ENCOUNTER — Encounter: Payer: Self-pay | Admitting: Cardiovascular Disease

## 2017-04-26 ENCOUNTER — Ambulatory Visit: Payer: BLUE CROSS/BLUE SHIELD | Admitting: Cardiovascular Disease

## 2017-04-26 VITALS — BP 148/86 | HR 92 | Ht 60.0 in | Wt 214.8 lb

## 2017-04-26 DIAGNOSIS — R079 Chest pain, unspecified: Secondary | ICD-10-CM | POA: Diagnosis not present

## 2017-04-26 DIAGNOSIS — R0602 Shortness of breath: Secondary | ICD-10-CM | POA: Diagnosis not present

## 2017-04-26 NOTE — Patient Instructions (Addendum)
Medication Instructions:  Your physician recommends that you continue on your current medications as directed. Please refer to the Current Medication list given to you today.  Labwork: NONE  Testing/Procedures: Your physician has requested that you have an echocardiogram. Echocardiography is a painless test that uses sound waves to create images of your heart. It provides your doctor with information about the size and shape of your heart and how well your heart's chambers and valves are working. This procedure takes approximately one hour. There are no restrictions for this procedure.  Your physician has requested that you have en exercise stress myoview. For further information please visit [email or web address]. Please follow instruction sheet, as given.  Follow-Up: Your physician wants you to follow-up in: 12 months with Dr. Nishan. You will receive a reminder letter in the mail two months in advance. If you don't receive a letter, please call our office to schedule the follow-up appointment.   If you need a refill on your cardiac medications before your next appointment, please call your pharmacy.    

## 2017-05-06 ENCOUNTER — Telehealth (HOSPITAL_COMMUNITY): Payer: Self-pay | Admitting: *Deleted

## 2017-05-06 NOTE — Telephone Encounter (Signed)
Left message on voicemail in reference to upcoming appointment scheduled for 05/09/17. Phone number given for a call back so details instructions can be given. Taylon Coole, [place]

## 2017-05-06 NOTE — Telephone Encounter (Signed)
Patient given detailed instructions per Myocardial Perfusion Study Information Sheet for the test on 05/09/17 at 0715. Patient notified to arrive 15 minutes early and that it is imperative to arrive on time for appointment to keep from having the test rescheduled.  If you need to cancel or reschedule your appointment, please call the office within 24 hours of your appointment. . Patient verbalized understanding.Sandra [name], [place]anae [place]

## 2017-05-09 ENCOUNTER — Ambulatory Visit (HOSPITAL_BASED_OUTPATIENT_CLINIC_OR_DEPARTMENT_OTHER): Payer: BLUE CROSS/BLUE SHIELD

## 2017-05-09 ENCOUNTER — Other Ambulatory Visit: Payer: Self-pay

## 2017-05-09 ENCOUNTER — Ambulatory Visit (HOSPITAL_COMMUNITY): Payer: BLUE CROSS/BLUE SHIELD | Attending: Cardiology

## 2017-05-09 DIAGNOSIS — E119 Type 2 diabetes mellitus without complications: Secondary | ICD-10-CM | POA: Diagnosis not present

## 2017-05-09 DIAGNOSIS — E785 Hyperlipidemia, unspecified: Secondary | ICD-10-CM | POA: Insufficient documentation

## 2017-05-09 DIAGNOSIS — J45909 Unspecified asthma, uncomplicated: Secondary | ICD-10-CM | POA: Diagnosis not present

## 2017-05-09 DIAGNOSIS — I10 Essential (primary) hypertension: Secondary | ICD-10-CM | POA: Diagnosis not present

## 2017-05-09 DIAGNOSIS — R0602 Shortness of breath: Secondary | ICD-10-CM | POA: Diagnosis present

## 2017-05-09 DIAGNOSIS — R079 Chest pain, unspecified: Secondary | ICD-10-CM

## 2017-05-09 LAB — ECHOCARDIOGRAM COMPLETE
Height: 60 in
Weight: 3424 oz

## 2017-05-09 MED ORDER — REGADENOSON 0.4 MG/5ML IV SOLN
0.4000 mg | Freq: Once | INTRAVENOUS | Status: AC
  Administered 2017-05-09: 0.4 mg via INTRAVENOUS

## 2017-05-09 MED ORDER — TECHNETIUM TC 99M TETROFOSMIN IV KIT
33.0000 | PACK | Freq: Once | INTRAVENOUS | Status: AC | PRN
  Administered 2017-05-09: 33 via INTRAVENOUS
  Filled 2017-05-09: qty 33

## 2017-05-20 ENCOUNTER — Other Ambulatory Visit: Payer: Self-pay | Admitting: Oncology

## 2017-05-20 ENCOUNTER — Ambulatory Visit (HOSPITAL_COMMUNITY): Payer: BLUE CROSS/BLUE SHIELD | Attending: Cardiology

## 2017-05-20 LAB — MYOCARDIAL PERFUSION IMAGING
LV dias vol: 140 mL (ref 46–106)
LV sys vol: 87 mL
Peak HR: 104 {beats}/min
RATE: 0.32
Rest HR: 80 {beats}/min
SDS: 2
SRS: 4
SSS: 5
TID: 0.97

## 2017-05-20 MED ORDER — TECHNETIUM TC 99M TETROFOSMIN IV KIT
32.0000 | PACK | Freq: Once | INTRAVENOUS | Status: AC | PRN
  Administered 2017-05-20: 32 via INTRAVENOUS
  Filled 2017-05-20: qty 32

## 2017-05-20 NOTE — Telephone Encounter (Signed)
Refill Approved

## 2017-05-21 ENCOUNTER — Other Ambulatory Visit: Payer: Self-pay | Admitting: *Deleted

## 2017-05-21 DIAGNOSIS — E669 Obesity, unspecified: Principal | ICD-10-CM

## 2017-05-21 DIAGNOSIS — E1169 Type 2 diabetes mellitus with other specified complication: Secondary | ICD-10-CM

## 2017-05-21 NOTE — Telephone Encounter (Signed)
Next appt scheduled 5/16 with PCP. 

## 2017-05-22 MED ORDER — SITAGLIPTIN PHOSPHATE 100 MG PO TABS: 100.0000 mg | ORAL_TABLET | Freq: Every day | ORAL | 3 refills | Status: DC

## 2017-05-22 NOTE — Telephone Encounter (Signed)
Refill Approved

## 2017-06-06 ENCOUNTER — Encounter: Payer: Self-pay | Admitting: Internal Medicine

## 2017-06-06 ENCOUNTER — Ambulatory Visit: Payer: BLUE CROSS/BLUE SHIELD | Admitting: Internal Medicine

## 2017-06-06 VITALS — BP 133/73 | HR 100 | Temp 98.4°F | Wt 212.0 lb

## 2017-06-06 DIAGNOSIS — Z87891 Personal history of nicotine dependence: Secondary | ICD-10-CM

## 2017-06-06 DIAGNOSIS — Z9189 Other specified personal risk factors, not elsewhere classified: Secondary | ICD-10-CM

## 2017-06-06 DIAGNOSIS — E669 Obesity, unspecified: Secondary | ICD-10-CM | POA: Diagnosis not present

## 2017-06-06 DIAGNOSIS — Z79899 Other long term (current) drug therapy: Secondary | ICD-10-CM | POA: Diagnosis not present

## 2017-06-06 DIAGNOSIS — J45909 Unspecified asthma, uncomplicated: Secondary | ICD-10-CM

## 2017-06-06 DIAGNOSIS — E785 Hyperlipidemia, unspecified: Secondary | ICD-10-CM

## 2017-06-06 DIAGNOSIS — E1169 Type 2 diabetes mellitus with other specified complication: Secondary | ICD-10-CM

## 2017-06-06 DIAGNOSIS — Z6841 Body Mass Index (BMI) 40.0 and over, adult: Secondary | ICD-10-CM | POA: Diagnosis not present

## 2017-06-06 DIAGNOSIS — M25512 Pain in left shoulder: Secondary | ICD-10-CM

## 2017-06-06 DIAGNOSIS — I447 Left bundle-branch block, unspecified: Secondary | ICD-10-CM | POA: Diagnosis not present

## 2017-06-06 DIAGNOSIS — I10 Essential (primary) hypertension: Secondary | ICD-10-CM

## 2017-06-06 DIAGNOSIS — Z0000 Encounter for general adult medical examination without abnormal findings: Secondary | ICD-10-CM

## 2017-06-06 DIAGNOSIS — Z794 Long term (current) use of insulin: Secondary | ICD-10-CM | POA: Diagnosis not present

## 2017-06-06 LAB — POCT GLYCOSYLATED HEMOGLOBIN (HGB A1C): Hemoglobin A1C: 9.6

## 2017-06-06 LAB — GLUCOSE, CAPILLARY: Glucose-Capillary: 241 mg/dL — ABNORMAL HIGH (ref 65–99)

## 2017-06-06 MED ORDER — NAPROXEN 500 MG PO TABS
500.0000 mg | ORAL_TABLET | Freq: Two times a day (BID) | ORAL | 0 refills | Status: AC
Start: 2017-06-06 — End: 2017-06-21

## 2017-06-06 NOTE — Patient Instructions (Addendum)
Thank you for allowing [name] to care for you  For your shoulder pain: Please take Naproxen 500mg  twice a day and rest your shoulder and apply alternating heat and ice.  For your Diabetes: Your A1c is improved some to 9.6 today, but remains elevated. We will need to continue to monitor this and adjust your medications if need at future visits.  Your blood pressure remains well controlled. Continue to take your medications and work on diet and exercise.  We are checking your cholesterol today and you will be contacted with theses results.  Please follow up in about 3 months

## 2017-06-06 NOTE — Progress Notes (Signed)
   CC: Diabetes, Hypertension, Hyperlipidemia, Healthcare maintenance, Shoulder pain  HPI:  [name] is a 52 y.o. F with PMHx listed below presenting for Diabetes, Hypertension, Hyperlipidemia, Healthcare maintenance, Shoulder pain. Please see the A&P for the status of the patient's chronic medical problems.  Patient states she has been experiencing some L shoulder pain with use since rock climbing for the first time on a recent cruise at the end of April. She state the pain occurs when lifting her arm over he head or pressing on the deltoid muscle (or laying on it). She has not tried anything for the pain, but has been using the should a little less. No significant pain at rest no radiation of the pain.  Patient also reports recently visiting her home when she does not often go (as she stays with her mother most of the time currently). And after exposure to the dust in her home she had shortness of breath which was relieved by two uses of albuterol inhaler that day and the following day. She only had to use it once on the third day and once on the fourth day (which is today).  Past Medical History:  Diagnosis Date  . Asthma    as a child  . Carpal tunnel syndrome   . Diabetes mellitus type 2, uncontrolled (HCC) DX: 1999   Started as gestational diabetes  . ESSENTIAL HYPERTENSION 07/08/2006  . HSV (herpes simplex virus) anogenital infection   . HYPERLIPIDEMIA 08/20/2007  . Menorrhagia   . Migraine    Normal CT head (08/06/2006)  . Swelling of abdominal wall 03/02/2014   Review of Systems:  Performed and all others negative.  Physical Exam:  Vitals:   06/06/17 1427  BP: 133/73  Pulse: 100  Temp: 98.4 F (36.9 C)  TempSrc: Oral  SpO2: 100%  Weight: 212 lb (96.2 kg)   Physical Exam  Constitutional: She appears well-developed and well-nourished. No distress.  Cardiovascular: Normal rate, regular rhythm, normal heart sounds and intact distal pulses.  Pulmonary/Chest:  Effort normal. No respiratory distress.  Trace wheeze RUL  Abdominal: Soft. Bowel sounds are normal. She exhibits no distension. There is no tenderness.  Musculoskeletal: She exhibits no edema.  Tenderness to palpation of L deltoid, pain with abduction     Assessment & Plan:   See Encounters Tab for problem based charting.  Patient discussed with Dr. Dareen Piano

## 2017-06-07 DIAGNOSIS — M25512 Pain in left shoulder: Secondary | ICD-10-CM | POA: Insufficient documentation

## 2017-06-07 HISTORY — DX: Pain in left shoulder: M25.512

## 2017-06-07 LAB — LIPID PANEL
Chol/HDL Ratio: 4.5 ratio — ABNORMAL HIGH (ref 0.0–4.4)
Cholesterol, Total: 138 mg/dL (ref 100–199)
HDL: 31 mg/dL — ABNORMAL LOW (ref >39)
LDL Calculated: 89 mg/dL (ref 0–99)
Triglycerides: 90 mg/dL (ref 0–149)
VLDL Cholesterol Cal: 18 mg/dL (ref 5–40)

## 2017-06-07 NOTE — Assessment & Plan Note (Signed)
Patient states she has been experiencing some L shoulder pain with use since rock climbing for the first time on a recent cruise at the end of April. She state the pain occurs when lifting her arm over he head or pressing on the deltoid muscle (or laying on it). She has not tried anything for the pain, but has been using the should a little less. No significant pain at rest no radiation of the pain.  On exam, L deltoid is tender to palpation and ROM is intact, though pain occurs with abduction. No pain or ROM limitation at neck or elbow. Will treat conservatively for now for muscle strain.  - Naproxen 500mg  BID x15d - Alternate Heat and Cold - Rest the shoulder as much as able

## 2017-06-08 ENCOUNTER — Encounter: Payer: Self-pay | Admitting: Internal Medicine

## 2017-06-08 NOTE — Assessment & Plan Note (Signed)
-   Pap Smear and Mammogram completed by OB/GYN at recent visit, will await results.  - Patient has colonoscopy scheduled for tomorrow

## 2017-06-08 NOTE — Assessment & Plan Note (Signed)
Patient reports recently visiting her home when she does not often go (as she stays with her mother most of the time currently). And after exposure to the dust in her home she had shortness of breath which was relieved by two uses of albuterol inhaler that day and the following day. She only had to use it once on the third day and once on the fourth day (which is today).  As patient is improving and only trace wheezing is noted (at RUL) will continue to monitor and have patient call if she requires increasing amounts of albuterol to control symptoms or if symptoms become more severe. - Continue Albuterol PRN

## 2017-06-08 NOTE — Assessment & Plan Note (Signed)
Patient has history of Hyperlipidemia and her last Lipid panel was 2014. She has agreed to have repeat panel today.  - Lipid Panel Today - Continue Atorvastatin  ADDENDUM: Results of Lipid Panel show Total 138, HDL 31, LDL 89. Patient has ASCVD risk of 14.5% and with risk greater than 7% is recommended to be on high intensity statin. - Will discuss increasing Atorvastatin to 40mg  Daily.

## 2017-06-08 NOTE — Assessment & Plan Note (Signed)
BP today 133/73 (last visit 135/69). She remains well controlled on current medications. (If needed in the future, she can go up on HCTZ component of current medication) - Quinapril-HCTZ 20-12.5mg  Daily - Propranolol 20mg  BID - Continue to work on lifestyle modifications

## 2017-06-08 NOTE — Assessment & Plan Note (Addendum)
A1c today is 9.6, which is mildly improved from previous of 9.8. Patient qualifies this by saying she ate poorly on her cruise and feels she can do better on her diet.  We again discussed increasing her insulin and also the possibility of switching her glipizide to Victoza. However, she states she is not ready to increase her medications as she feels she can do better with diet control and she also would before not to add another injectable medication.  It was explained that further improvement in diet and exercise will help, but is very unlikely to provide significant improvement in her A1c. She states she will continue to consider medication changes and we will readdress this at her next visit in 3 months. - A1C today:9.6 - Metformin 1000mg  BID - Januvia 100mg  Daily - Glipizide 10mg  BID -Basaglar10 unitsqhs -ContinueBlood sugar log -Continue Lifestyle modifcations -Return to clinic in14months

## 2017-06-08 NOTE — Assessment & Plan Note (Signed)
Patient referred to cardiology earlier this year for stress testing following shortness of breath with chest, nausea, and lightheadedness. - Myoview: Intermediate risk due to moderately depressed left ventricular systolic function, but without reversible perfusion abnormalities. Small fixed LBBB-related perfusion artifact. - Echo: Normal LV size, EF 40-45%. Septal and inferior hypokinesis with septal-lateral dyssynchrony consistent with LBBB. Normal RV size and systolic function. No significant valvular abnormalities.  Patient states she was contacted by cardiology following these results and told to follow up in 1 year. - Follow up with cardiology in 1 year

## 2017-06-10 NOTE — Progress Notes (Signed)
Internal Medicine Clinic Attending  Case discussed with Dr. Melvin  at the time of the visit.  We reviewed the resident's history and exam and pertinent patient test results.  I agree with the assessment, diagnosis, and plan of care documented in the resident's note.  

## 2017-06-24 ENCOUNTER — Other Ambulatory Visit: Payer: Self-pay | Admitting: *Deleted

## 2017-06-24 MED ORDER — QUINAPRIL-HYDROCHLOROTHIAZIDE 20-12.5 MG PO TABS: 1.0000 | ORAL_TABLET | Freq: Every day | ORAL | 1 refills | Status: DC

## 2017-06-24 NOTE — Telephone Encounter (Signed)
Refill approved.

## 2017-08-15 LAB — HM DIABETES EYE EXAM

## 2017-08-27 ENCOUNTER — Other Ambulatory Visit: Payer: Self-pay | Admitting: *Deleted

## 2017-08-27 DIAGNOSIS — E669 Obesity, unspecified: Principal | ICD-10-CM

## 2017-08-27 DIAGNOSIS — E1169 Type 2 diabetes mellitus with other specified complication: Secondary | ICD-10-CM

## 2017-08-29 MED ORDER — BASAGLAR KWIKPEN 100 UNIT/ML ~~LOC~~ SOPN: 10.0000 [IU] | PEN_INJECTOR | Freq: Every day | SUBCUTANEOUS | 3 refills | Status: DC

## 2017-08-29 NOTE — Telephone Encounter (Signed)
Refill Approved

## 2017-09-05 ENCOUNTER — Encounter: Payer: Self-pay | Admitting: *Deleted

## 2017-09-05 ENCOUNTER — Encounter: Payer: BLUE CROSS/BLUE SHIELD | Admitting: Internal Medicine

## 2017-09-05 ENCOUNTER — Telehealth: Payer: Self-pay | Admitting: Internal Medicine

## 2017-09-05 NOTE — Telephone Encounter (Signed)
[facility] Specialist.  Patient last seen on 08/15/2017.  Records Rec'd and given to [employer]

## 2017-09-19 ENCOUNTER — Encounter: Payer: BLUE CROSS/BLUE SHIELD | Admitting: Internal Medicine

## 2017-10-17 ENCOUNTER — Encounter (INDEPENDENT_AMBULATORY_CARE_PROVIDER_SITE_OTHER): Payer: Self-pay

## 2017-10-17 ENCOUNTER — Ambulatory Visit: Payer: BLUE CROSS/BLUE SHIELD | Admitting: Internal Medicine

## 2017-10-17 ENCOUNTER — Other Ambulatory Visit: Payer: Self-pay

## 2017-10-17 ENCOUNTER — Encounter: Payer: Self-pay | Admitting: Internal Medicine

## 2017-10-17 VITALS — BP 133/66 | HR 84 | Temp 98.3°F | Ht 60.0 in | Wt 202.7 lb

## 2017-10-17 DIAGNOSIS — E669 Obesity, unspecified: Secondary | ICD-10-CM | POA: Diagnosis not present

## 2017-10-17 DIAGNOSIS — Z794 Long term (current) use of insulin: Secondary | ICD-10-CM

## 2017-10-17 DIAGNOSIS — I509 Heart failure, unspecified: Secondary | ICD-10-CM | POA: Diagnosis not present

## 2017-10-17 DIAGNOSIS — I43 Cardiomyopathy in diseases classified elsewhere: Secondary | ICD-10-CM | POA: Diagnosis not present

## 2017-10-17 DIAGNOSIS — I110 Hypertensive heart disease with heart failure: Secondary | ICD-10-CM

## 2017-10-17 DIAGNOSIS — E1169 Type 2 diabetes mellitus with other specified complication: Secondary | ICD-10-CM | POA: Diagnosis not present

## 2017-10-17 DIAGNOSIS — I10 Essential (primary) hypertension: Secondary | ICD-10-CM

## 2017-10-17 DIAGNOSIS — I420 Dilated cardiomyopathy: Secondary | ICD-10-CM

## 2017-10-17 DIAGNOSIS — Z6839 Body mass index (BMI) 39.0-39.9, adult: Secondary | ICD-10-CM

## 2017-10-17 LAB — POCT GLYCOSYLATED HEMOGLOBIN (HGB A1C): Hemoglobin A1C: 7.3 % — AB (ref 4.0–5.6)

## 2017-10-17 LAB — GLUCOSE, CAPILLARY: Glucose-Capillary: 105 mg/dL — ABNORMAL HIGH (ref 70–99)

## 2017-10-17 NOTE — Assessment & Plan Note (Signed)
2019 Myoview: Intermediate, EF 38%, reversible perfusion abnormalities. 2019 Echo: Normal LV size, EF 40-45%, Septal and inferior hypokinesis 2019 Cards visit: Non-Ischemic CM, Continue Med Tx - Will benefit from GLP-1 or SGLT-2

## 2017-10-17 NOTE — Patient Instructions (Signed)
Thank you for allowing [name] to care for you  Your blood pressure remains well controlled - Continue current medications  For you diabetes - A1c is improved today to 7.2! Keep up the good work! - Continue current medications - Continue Diet and exercise  Please follow up in about 3 months

## 2017-10-17 NOTE — Progress Notes (Signed)
   CC: Hypertensin, Diabetes    HPI:  [name] is a 52 y.o. F with PMHx listed below presenting for Hypertensin, Diabetes. Please see the A&P for the status of the patient's chronic medical problems.  Past Medical History:  Diagnosis Date  . Asthma    as a child  . Carpal tunnel syndrome   . Diabetes mellitus type 2, uncontrolled (HCC) DX: 1999   Started as gestational diabetes  . ESSENTIAL HYPERTENSION 07/08/2006  . HSV (herpes simplex virus) anogenital infection   . HYPERLIPIDEMIA 08/20/2007  . Menorrhagia   . Migraine    Normal CT head (08/06/2006)  . Swelling of abdominal wall 03/02/2014   Review of Systems:  Performed and all others negative.  Physical Exam:  Vitals:   10/17/17 1524  BP: 133/66  Pulse: 84  Temp: 98.3 F (36.8 C)  TempSrc: Oral  SpO2: 97%  Weight: 202 lb 11.2 oz (91.9 kg)  Height: 5' (1.524 m)   Physical Exam  Constitutional: She appears well-developed and well-nourished. No distress.  Cardiovascular: Normal rate, regular rhythm, normal heart sounds and intact distal pulses.  Pulmonary/Chest: Effort normal and breath sounds normal. No respiratory distress.  Abdominal: Soft. Bowel sounds are normal. She exhibits no distension. There is no tenderness.  Musculoskeletal: She exhibits no edema or deformity.  Skin: Skin is warm and dry.    Assessment & Plan:   See Encounters Tab for problem based charting.  Patient discussed with Dr. Eppie Gibson

## 2017-10-18 ENCOUNTER — Encounter: Payer: Self-pay | Admitting: Internal Medicine

## 2017-10-18 NOTE — Assessment & Plan Note (Signed)
BP 133/66 Today (last visit 133/73). She remains well controlled on current medications. Will continue current regimen. - Quinapril-HCTZ 20-12.5mg  Daily - Propranolol 20mg  BID - Continue lifestyle modifications

## 2017-10-18 NOTE — Assessment & Plan Note (Signed)
A1c today is 7.3. This is significantly improved from her previous of 9.6. She states she has been working hard on diet and exercise. Our records reveal she has lost at least 10lbs since her last visit. Patient was congratulated on her success and encouraged to keep up the good work. She forgot to bring her blood sugar log with her today.  Changing her medication regimen to best suit her was discussed, particularly changing her glipizide to a GLP-1 or SGLT-2 considering her heart failure. Patient remains resistant to medication changes, especially if it involves adding injectable medications. Will readdress potential of switching to SGLT-2 inhibitor at follow up. - Metformin 1000mg  BID - Januvia 100mg  Daily - Glipizide 10mg  BID -Basaglar10 unitsqhs -ContinueBlood sugar log -Continue Lifestyle modifcations -Return to clinic in31months

## 2017-10-22 NOTE — Progress Notes (Signed)
Patient ID: Sandra [name], female   DOB: 06-Oct-1965, 52 y.o.   MRN: [id number]  Case discussed with Dr. Trilby Drummer at the time of the visit. We reviewed the resident's history and exam and pertinent patient test results. I agree with the assessment, diagnosis, and plan of care documented in the resident's note.

## 2017-10-22 NOTE — Addendum Note (Signed)
Addended by: Oval Linsey D on: 10/22/2017 03:37 PM   Modules accepted: Level of Service

## 2017-11-28 LAB — HM DIABETES EYE EXAM

## 2017-12-03 ENCOUNTER — Other Ambulatory Visit: Payer: Self-pay | Admitting: Internal Medicine

## 2017-12-03 DIAGNOSIS — I10 Essential (primary) hypertension: Secondary | ICD-10-CM

## 2017-12-12 ENCOUNTER — Ambulatory Visit (INDEPENDENT_AMBULATORY_CARE_PROVIDER_SITE_OTHER): Payer: BLUE CROSS/BLUE SHIELD | Admitting: Internal Medicine

## 2017-12-12 ENCOUNTER — Other Ambulatory Visit: Payer: Self-pay

## 2017-12-12 ENCOUNTER — Encounter: Payer: Self-pay | Admitting: Internal Medicine

## 2017-12-12 ENCOUNTER — Ambulatory Visit (HOSPITAL_COMMUNITY)
Admission: RE | Admit: 2017-12-12 | Discharge: 2017-12-12 | Disposition: A | Discharge status: Home / Self Care | Payer: BLUE CROSS/BLUE SHIELD | Source: Ambulatory Visit | Attending: Internal Medicine | Admitting: Internal Medicine

## 2017-12-12 VITALS — BP 144/71 | HR 87 | Temp 98.0°F | Ht 60.0 in | Wt 202.6 lb

## 2017-12-12 DIAGNOSIS — B349 Viral infection, unspecified: Secondary | ICD-10-CM | POA: Diagnosis not present

## 2017-12-12 DIAGNOSIS — Z6839 Body mass index (BMI) 39.0-39.9, adult: Secondary | ICD-10-CM

## 2017-12-12 DIAGNOSIS — G529 Cranial nerve disorder, unspecified: Secondary | ICD-10-CM | POA: Diagnosis not present

## 2017-12-12 DIAGNOSIS — Z8701 Personal history of pneumonia (recurrent): Secondary | ICD-10-CM

## 2017-12-12 DIAGNOSIS — I517 Cardiomegaly: Secondary | ICD-10-CM | POA: Insufficient documentation

## 2017-12-12 DIAGNOSIS — Z794 Long term (current) use of insulin: Secondary | ICD-10-CM

## 2017-12-12 DIAGNOSIS — E1169 Type 2 diabetes mellitus with other specified complication: Secondary | ICD-10-CM

## 2017-12-12 DIAGNOSIS — E669 Obesity, unspecified: Secondary | ICD-10-CM

## 2017-12-12 DIAGNOSIS — Z8709 Personal history of other diseases of the respiratory system: Secondary | ICD-10-CM

## 2017-12-12 DIAGNOSIS — R05 Cough: Secondary | ICD-10-CM | POA: Diagnosis not present

## 2017-12-12 DIAGNOSIS — R059 Cough, unspecified: Secondary | ICD-10-CM

## 2017-12-12 MED ORDER — BASAGLAR KWIKPEN 100 UNIT/ML ~~LOC~~ SOPN
8.0000 [IU] | PEN_INJECTOR | Freq: Every day | SUBCUTANEOUS | 3 refills | Status: DC
Start: 2017-12-12 — End: 2018-08-11

## 2017-12-12 MED ORDER — GUAIFENESIN-CODEINE 100-10 MG/5ML PO SYRP: 10.0000 mL | ORAL_SOLUTION | Freq: Every evening | ORAL | 0 refills | Status: DC | PRN

## 2017-12-12 NOTE — Patient Instructions (Addendum)
Thank you for allowing [name] to provide your care today. Today we discussed your cough and diabetes    I have ordered the chest x-ray, I will call you later today with the results.  You can start using the guaifenesin-codeine syrup for your cough, please take this only at night since this can cause you to become drowsy.  Please continue to get plenty of rest and stay well hydrated.   In regards to your diabetes, please decrease your Basaglar to 8 units daily, continue to check your sugars and do not skip any meals.   Please follow-up in 1 month.    Should you have any questions or concerns please call the internal medicine clinic at [phone].    Upper Respiratory Infection, Adult Most upper respiratory infections (URIs) are caused by a virus. A URI affects the nose, throat, and upper air passages. The most common type of URI is often called "the common cold." Follow these instructions at home:  Take medicines only as told by your doctor.  Gargle warm saltwater or take cough drops to comfort your throat as told by your doctor.  Use a warm mist humidifier or inhale steam from a shower to increase air moisture. This may make it easier to breathe.  Drink enough fluid to keep your pee (urine) clear or pale yellow.  Eat soups and other clear broths.  Have a healthy diet.  Rest as needed.  Go back to work when your fever is gone or your doctor says it is okay. ? You may need to stay home longer to avoid giving your URI to others. ? You can also wear a face mask and wash your hands often to prevent spread of the virus.  Use your inhaler more if you have asthma.  Do not use any tobacco products, including cigarettes, chewing tobacco, or electronic cigarettes. If you need help quitting, ask your doctor. Contact a doctor if:  You are getting worse, not better.  Your symptoms are not helped by medicine.  You have chills.  You are getting more short of breath.  You have brown or  red mucus.  You have yellow or brown discharge from your nose.  You have pain in your face, especially when you bend forward.  You have a fever.  You have puffy (swollen) neck glands.  You have pain while swallowing.  You have white areas in the back of your throat. Get help right away if:  You have very bad or constant: ? Headache. ? Ear pain. ? Pain in your forehead, behind your eyes, and over your cheekbones (sinus pain). ? Chest pain.  You have long-lasting (chronic) lung disease and any of the following: ? Wheezing. ? Long-lasting cough. ? Coughing up blood. ? A change in your usual mucus.  You have a stiff neck.  You have changes in your: ? Vision. ? Hearing. ? Thinking. ? Mood. This information is not intended to replace advice given to you by your health care provider. Make sure you discuss any questions you have with your health care provider. Document Released: 06/27/2007 Document Revised: 09/11/2015 Document Reviewed: 04/15/2013 Elsevier Interactive Patient Education  2018 [employer].

## 2017-12-12 NOTE — Progress Notes (Signed)
cxr   CC: Cough  HPI:  Sandra [name] is a 52 y.o.  with a PMH listed below presenting for cough.  She reports that one week she has been having a cough with yellow and intermittent hemoptysis.  She has also been having chills shortness of breath, dizziness, headaches, nausea, loose stools, runny nose, and some pleuritic chest pain. She does report that she feels like something is in the back of her throat when she swallows, but denies any sore throat or pain with swallowing. She has tried equate which helps bring up some mucus and ibuprofen which helps with the pain. 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 She has not had the flu shot. She reports that she had a history of pneumonia. Patient has a history of asthma however does not require her inhaler often, she reports that she's only used it a few times in the past month.   Patient is currently taking metformin 1000 mg BID, januvia 100 mg daily, glipizide 10 mg BID, and basaglar 10 units qhs. Her last A1c was 2 months ago and it was 7.3. She reports that she has been having low blood sugars, down to the 50s, about 2x per week. She will get some shakiness and light headedness with the episodes.  Please see A&P for status of the patient's chronic medical conditions  Past Medical History:  Diagnosis Date  . Acute pain of left shoulder 06/07/2017  . Asthma    as a child  . Carpal tunnel syndrome   . Diabetes mellitus type 2, uncontrolled (HCC) DX: 1999   Started as gestational diabetes  . ESSENTIAL HYPERTENSION 07/08/2006  . HSV (herpes simplex virus) anogenital infection   . HYPERLIPIDEMIA 08/20/2007  . Menorrhagia   . Migraine    Normal CT head (08/06/2006)  . Swelling of abdominal wall 03/02/2014   Review of Systems: Refer to history of present illness and assessment and plans for pertinent review of systems, all others reviewed and negative.  Physical Exam:  Vitals:   12/12/17 1056  BP: (!) 144/71    Pulse: 87  Temp: 98 F (36.7 C)  TempSrc: Axillary  SpO2: 100%  Weight: 202 lb 9.6 oz (91.9 kg)  Height: 5' (1.524 m)   Physical Exam  Constitutional: She is oriented to person, place, and time. No distress.  Unwell appearing, NAD, intermittent coughing  HENT:  Head: Normocephalic and atraumatic.  Mouth/Throat: No oropharyngeal exudate.  Erythematous posterior pharynx with some cobblestoning, no exudates  Eyes: Pupils are equal, round, and reactive to light. Conjunctivae and EOM are normal.  Neck: Normal range of motion. Neck supple. No thyromegaly present.  Cardiovascular: Normal rate, regular rhythm and normal heart sounds.  Pulmonary/Chest: Effort normal. No respiratory distress.  Crackles in right lower lung field  Abdominal: Soft. Bowel sounds are normal. She exhibits distension.  Musculoskeletal: Normal range of motion.  Lymphadenopathy:    She has no cervical adenopathy.  Neurological: She is alert and oriented to person, place, and time. A cranial nerve deficit is present.  Skin: Skin is warm and dry. No erythema.  Psychiatric: Mood and affect normal.    Social History   Socioeconomic History  . Marital status: Married    Spouse name: Not on file  . Number of children: Not on file  . Years of education: Not on file  . Highest education level: Not on file  Occupational History  . Occupation: Chemical engineer  Social  Needs  . Financial resource strain: Not on file  . Food insecurity:    Worry: Not on file    Inability: Not on file  . Transportation needs:    Medical: Not on file    Non-medical: Not on file  Tobacco Use  . Smoking status: Never Smoker  . Smokeless tobacco: Never Used  Substance and Sexual Activity  . Alcohol use: No    Alcohol/week: 0.0 standard drinks  . Drug use: No  . Sexual activity: Yes    Birth control/protection: Surgical  Lifestyle  . Physical activity:    Days per week: Not on file    Minutes per session: Not on file  .  Stress: Not on file  Relationships  . Social connections:    Talks on phone: Not on file    Gets together: Not on file    Attends religious service: Not on file    Active member of club or organization: Not on file    Attends meetings of clubs or organizations: Not on file    Relationship status: Not on file  . Intimate partner violence:    Fear of current or ex partner: Not on file    Emotionally abused: Not on file    Physically abused: Not on file    Forced sexual activity: Not on file  Other Topics Concern  . Not on file  Social History Narrative   Studying at [employer] in early childhood education.   Married.   Family History  Problem Relation Age of Onset  . Hypertension Father   . Diabetes Father     Assessment & Plan:   See Encounters Tab for problem based charting.  Patient seen with Dr. Evette Doffing

## 2017-12-13 ENCOUNTER — Other Ambulatory Visit: Payer: Self-pay | Admitting: Obstetrics and Gynecology

## 2017-12-13 NOTE — Assessment & Plan Note (Signed)
Patient is currently taking metformin 1000 mg BID, januvia 100 mg daily, glipizide 10 mg BID, and basaglar 10 units qhs. Her last A1c was 2 months ago and it was 7.3. She reports that she has been having low blood sugars, down to the 50s, about 2x per week. She will get some shakiness and light headedness with the episodes.  Advised patient to decrease her basaglar to 8 units a day.  RTC in 1 month for A1c check and further adjustement

## 2017-12-13 NOTE — Progress Notes (Signed)
Internal Medicine Clinic Attending  I saw and evaluated the patient.  I personally confirmed the key portions of the history and exam documented by Dr. Krienke and I reviewed pertinent patient test results.  The assessment, diagnosis, and plan were formulated together and I agree with the documentation in the resident's note.    

## 2017-12-13 NOTE — Assessment & Plan Note (Signed)
She reports that one week she has been having a cough with yellow and intermittent hemoptysis.  She has also been having chills shortness of breath, dizziness, headaches, nausea, loose stools, runny nose, and some pleuritic chest pain. She does report that she feels like something is in the back of her throat when she swallows, but denies any sore throat or pain with swallowing. She has tried equate which helps bring up some mucus and ibuprofen which helps with the pain. 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 She has not had the flu shot. She reports that she had a history of pneumonia.   On exam she appears generally unwell, intermittent coughing, with some rhinorrhea, cobblestoning in the back of her throat, mildly enlarged tonsils with no exudates, crackles in her lower right lung fields.   Given her history this is most likely a viral bronchitis, however she does have some higher risk features for a bacterial respiratory infection such as pneumonia.   Plan: -Guaifenesin-codeine for cough -CXR  Chest X-ray resulted, no acute abnormalities. Contacted patient and advised her that this is likely a viral URI, to continue taking the cough medication, get plenty of rest, and stay well hydrated.

## 2017-12-17 ENCOUNTER — Other Ambulatory Visit (HOSPITAL_COMMUNITY): Payer: BLUE CROSS/BLUE SHIELD

## 2017-12-18 ENCOUNTER — Other Ambulatory Visit: Payer: Self-pay

## 2017-12-18 ENCOUNTER — Encounter (HOSPITAL_COMMUNITY)
Admission: RE | Admit: 2017-12-18 | Discharge: 2017-12-18 | Disposition: A | Discharge status: Home / Self Care | Payer: BLUE CROSS/BLUE SHIELD | Source: Ambulatory Visit | Attending: Obstetrics and Gynecology | Admitting: Obstetrics and Gynecology

## 2017-12-18 ENCOUNTER — Encounter (HOSPITAL_COMMUNITY): Payer: Self-pay

## 2017-12-18 DIAGNOSIS — Z01812 Encounter for preprocedural laboratory examination: Secondary | ICD-10-CM | POA: Insufficient documentation

## 2017-12-18 HISTORY — DX: Left bundle-branch block, unspecified: I44.7

## 2017-12-18 HISTORY — DX: Cardiomyopathy, unspecified: I42.9

## 2017-12-18 HISTORY — DX: Calculus of gallbladder without cholecystitis without obstruction: K80.20

## 2017-12-18 LAB — BASIC METABOLIC PANEL WITH GFR
Anion gap: 8 (ref 5–15)
BUN: 9 mg/dL (ref 6–20)
CO2: 26 mmol/L (ref 22–32)
Calcium: 9.6 mg/dL (ref 8.9–10.3)
Chloride: 101 mmol/L (ref 98–111)
Creatinine, Ser: 0.78 mg/dL (ref 0.44–1.00)
GFR calc Af Amer: >60 mL/min
GFR calc non Af Amer: >60 mL/min
Glucose, Bld: 115 mg/dL — ABNORMAL HIGH (ref 70–99)
Potassium: 3.8 mmol/L (ref 3.5–5.1)
Sodium: 135 mmol/L (ref 135–145)

## 2017-12-18 LAB — CBC
HCT: 36.4 % (ref 36.0–46.0)
Hemoglobin: 11.9 g/dL — ABNORMAL LOW (ref 12.0–15.0)
MCH: 32.4 pg (ref 26.0–34.0)
MCHC: 32.7 g/dL (ref 30.0–36.0)
MCV: 99.2 fL (ref 80.0–100.0)
Platelets: 345 10*3/uL (ref 150–400)
RBC: 3.67 MIL/uL — ABNORMAL LOW (ref 3.87–5.11)
RDW: 11.9 % (ref 11.5–15.5)
WBC: 7.4 10*3/uL (ref 4.0–10.5)
nRBC: 0 % (ref 0.0–0.2)

## 2017-12-18 LAB — GLUCOSE, CAPILLARY: Glucose-Capillary: 84 mg/dL (ref 70–99)

## 2017-12-18 LAB — HCG, SERUM, QUALITATIVE: Preg, Serum: NEGATIVE

## 2017-12-18 LAB — HEMOGLOBIN A1C
Hgb A1c MFr Bld: 7.1 % — ABNORMAL HIGH (ref 4.8–5.6)
Mean Plasma Glucose: 157.07 mg/dL

## 2017-12-18 NOTE — Progress Notes (Signed)
PCP - Dr. Sheppard Coil  Cardiologist - Dr. Elberta Spaniel  Chest x-ray - 12/12/17 (E)  EKG - 03/18/17 (E)  Stress Test - 05/20/17 (E)  ECHO - 05/09/17 (E)  Cardiac Cath - Denies  AICD- na PM- na LOOP- na  Sleep Study - Yes- Neg CPAP - None  LABS- 12/18/17: CBC, BMP, HCG  ASA- Denies  HA1C- 12/18/17 Fasting Blood Sugar - 44-172, today 84 Checks Blood Sugar __2___ times a week.  Anesthesia- Yes- cardiac history  Pt denies having chest pain, sob, or fever at this time. All instructions explained to the pt, with a verbal understanding of the material. Pt agrees to go over the instructions while at home for a better understanding. The opportunity to ask questions was provided.

## 2017-12-18 NOTE — Pre-Procedure Instructions (Addendum)
Sandra [name]  12/18/2017      [facility] ([facility] [place] [address] [place] ([place]) [place] [address] Phone: [phone] Fax: [phone]    Your procedure is scheduled on Wed., Dec. 4, 2019 from 2:30PM-3:11PM  Report to [facility] at 1:00PM  Call this number if you have problems the morning of surgery:  [phone]   Remember:  Do not eat or drink after midnight on Dec. 3rd               Take these medicines the morning of surgery with A SIP OF WATER: Fluticasone (FLONASE) and Propranolol (INDERAL)  If needed: Acetaminophen (TYLENOL)  As of today, stop taking all Other Aspirin Products, Vitamins, Fish oils, and Herbal medications. Also stop all NSAIDS i.e. Advil, Ibuprofen, Motrin, Aleve, Anaprox, Naproxen, BC, Goody Powders, and all Supplements.  WHAT DO I DO ABOUT MY DIABETES MEDICATION?  Marland Kitchen Do not take GlipiZIDE (GLUCOTROL) the evening before surgery.  . Do not take GlipiZIDE (GLUCOTROL), SitaGLIPtin (JANUVIA), and MetFORMIN (GLUCOPHAGE-XR) the day of surgery  . THE NIGHT BEFORE SURGERY, take ____4_______ units of ____Insulin Glargine (BASAGLAR KWIKPEN)_______insulin.  How to Manage Your Diabetes Before and After Surgery  Why is it important to control my blood sugar before and after surgery? . Improving blood sugar levels before and after surgery helps healing and can limit problems. . A way of improving blood sugar control is eating a healthy diet by: o  Eating less sugar and carbohydrates o  Increasing activity/exercise o  Talking with your doctor about reaching your blood sugar goals . High blood sugars (greater than 180 mg/dL) can raise your risk of infections and slow your recovery, so you will need to focus on controlling your diabetes during the weeks before surgery. . Make sure that the doctor who takes care of your diabetes knows about your planned surgery including the date and  location.  How do I manage my blood sugar before surgery? . Check your blood sugar at least 4 times a day, starting 2 days before surgery, to make sure that the level is not too high or low. o Check your blood sugar the morning of your surgery when you wake up and every 2 hours until you get to the Short Stay unit. . If your blood sugar is less than 70 mg/dL, you will need to treat for low blood sugar: o Do not take insulin. o Treat a low blood sugar (less than 70 mg/dL) with  cup of clear juice (cranberry or apple), 4 glucose tablets, OR glucose gel. Recheck blood sugar in 15 minutes after treatment (to make sure it is greater than 70 mg/dL). If your blood sugar is not greater than 70 mg/dL on recheck, call [phone] o  for further instructions. .  If your CBG is greater than 220 mg/dL, call the number above for further instructions.  . If you are admitted to the hospital after surgery: o Your blood sugar will be checked by the staff and you will probably be given insulin after surgery (instead of oral diabetes medicines) to make sure you have good blood sugar levels. o The goal for blood sugar control after surgery is 80-180 mg/dL.  Reviewed and Endorsed by [facility] Patient Education Committee, August 2015    Do not wear jewelry, make-up or nail polish.  Do not wear lotions, powders, or perfumes, you may wear deodorant.  Do not shave 48  hours prior to surgery.    Do not bring valuables to the hospital.  [facility] is not responsible for any belongings or valuables.  Contacts, dentures or bridgework may not be worn into surgery.  Leave your suitcase in the car.  After surgery it may be brought to your room.  For patients admitted to the hospital, discharge time will be determined by your treatment team.  Patients discharged the day of surgery will not be allowed to drive home.   Special instructions:   [place]- Preparing For Surgery  Before surgery, you can play an  important role. Because skin is not sterile, your skin needs to be as free of germs as possible. You can reduce the number of germs on your skin by washing with CHG (chlorahexidine gluconate) Soap before surgery.  CHG is an antiseptic cleaner which kills germs and bonds with the skin to continue killing germs even after washing.    Oral Hygiene is also important to reduce your risk of infection.  Remember - BRUSH YOUR TEETH THE MORNING OF SURGERY WITH YOUR REGULAR TOOTHPASTE  Please do not use if you have an allergy to CHG or antibacterial soaps. If your skin becomes reddened/irritated stop using the CHG.  Do not shave (including legs and underarms) for at least 48 hours prior to first CHG shower. It is OK to shave your face.  Please follow these instructions carefully.   1. Shower the NIGHT BEFORE SURGERY and the MORNING OF SURGERY with CHG.   2. If you chose to wash your hair, wash your hair first as usual with your normal shampoo.  3. After you shampoo, rinse your hair and body thoroughly to remove the shampoo.  4. Use CHG as you would any other liquid soap. You can apply CHG directly to the skin and wash gently with a scrungie or a clean washcloth.   5. Apply the CHG Soap to your body ONLY FROM THE NECK DOWN.  Do not use on open wounds or open sores. Avoid contact with your eyes, ears, mouth and genitals (private parts). Wash Face and genitals (private parts)  with your normal soap.  6. Wash thoroughly, paying special attention to the area where your surgery will be performed.  7. Thoroughly rinse your body with warm water from the neck down.  8. DO NOT shower/wash with your normal soap after using and rinsing off the CHG Soap.  9. Pat yourself dry with a CLEAN TOWEL.  10. Wear CLEAN PAJAMAS to bed the night before surgery, wear comfortable clothes the morning of surgery  11. Place CLEAN SHEETS on your bed the night of your first shower and DO NOT SLEEP WITH PETS.  Day of  Surgery:  Do not apply any lotions.  Please wear clean clothes to the hospital/surgery center.   Remember to brush your teeth WITH YOUR REGULAR TOOTHPASTE.  Please read over the following fact sheets that you were given. Pain Booklet, Coughing and Deep Breathing and Surgical Site Infection Prevention

## 2017-12-20 ENCOUNTER — Encounter (HOSPITAL_COMMUNITY): Payer: Self-pay

## 2017-12-20 NOTE — Progress Notes (Signed)
Anesthesia Chart Review:   Case:  [id number] Date/Time:  12/25/17 1415   Procedure:  DILATATION & CURETTAGE/HYSTEROSCOPY WITH MYOSURE (N/A )   Anesthesia type:  Choice   Pre-op diagnosis:  Endometrial Polyp   Location:  [facility] OR ROOM 4 / [facility]   Surgeon:  Charyl Bigger, MD      DISCUSSION:  - Pt is a 52 year old female with hx non-ischemic dilated cardiomyopathy, LBBB, HTN, DM.   - See echo and stress test results from 04/2017 below.     VS: BP 115/78   Pulse 90   Temp 36.9 C   Resp 20   Ht 5' (1.524 m)   Wt 90.7 kg   LMP 11/08/2017   SpO2 100%   BMI 39.04 kg/m   PROVIDERS: - PCP is [name], MD - Cardiologist is Jenkins Rouge, MD. Last office visit 04/26/17; 1 year f/u recommended   LABS: Labs reviewed: Acceptable for surgery. (all labs ordered are listed, but only abnormal results are displayed)  Labs Reviewed  CBC - Abnormal; Notable for the following components:      Result Value   RBC 3.67 (*)    Hemoglobin 11.9 (*)    All other components within normal limits  HEMOGLOBIN A1C - Abnormal; Notable for the following components:   Hgb A1c MFr Bld 7.1 (*)    All other components within normal limits  BASIC METABOLIC PANEL - Abnormal; Notable for the following components:   Glucose, Bld 115 (*)    All other components within normal limits  GLUCOSE, CAPILLARY  HCG, SERUM, QUALITATIVE     IMAGES:  CXR 12/12/17: Stable cardiomegaly.  No evidence for acute pulmonary abnormality.   EKG 03/18/17: NSR. Right axis deviation. Non-specific intra-ventricular conduction block. T wave abnormality, consider inferolateral ischemia   CV:  Nuclear stress test 05/20/17:   Nuclear stress EF: 38%.  No T wave inversion was noted during stress.  There was no ST segment deviation noted during stress.  Defect 1: There is a small defect of mild severity present in the apical septal location.  This is an intermediate risk study.  The left ventricular ejection  fraction is moderately decreased (30-44%).  - Intermediate risk study due to moderately depressed left ventricular systolic function, but without reversible perfusion abnormalities. - There is a small fixed LBBB-related perfusion artifact.   Echo 05/09/17:  - Left ventricle: The cavity size was normal. Wall thickness was normal. The septal and inferior walls were hypokinetic. There was septal-lateral dyssynchrony. Systolic function was mildly to moderately reduced. The estimated ejection fraction was in the range of 40% to 45%. Doppler parameters are consistent with abnormal left ventricular relaxation (grade 1 diastolic dysfunction). - Aortic valve: There was no stenosis. - Mitral valve: There was no significant regurgitation. - Left atrium: The atrium was mildly dilated. - Right ventricle: The cavity size was normal. Systolic function was normal. - Pulmonary arteries: No complete TR doppler jet so unable to estimate PA systolic pressure. - Inferior vena cava: The vessel was normal in size. The respirophasic diameter changes were in the normal range (>= 50%), consistent with normal central venous pressure. - Impressions:  Normal LV size with EF 40-45%. Septal and inferior hypokinesis with septal-lateral dyssynchrony consistent with LBBB. Normal RV size and systolic function. No significant valvular abnormalities.    Past Medical History:  Diagnosis Date  . Acute pain of left shoulder 06/07/2017  . Asthma    as a child  . Cardiomyopathy (  [place])    likely nonischemic DCM per Dr. Johnsie Cancel note 04/2017  . Carpal tunnel syndrome   . Diabetes mellitus type 2, uncontrolled (HCC) DX: 1999   Started as gestational diabetes  . ESSENTIAL HYPERTENSION 07/08/2006  . Gallstones   . HSV (herpes simplex virus) anogenital infection   . HYPERLIPIDEMIA 08/20/2007  . LBBB (left bundle branch block)   . Menorrhagia   . Migraine    Normal CT head (08/06/2006)  . Swelling of abdominal wall 03/02/2014    Past  Surgical History:  Procedure Laterality Date  . CHOLECYSTECTOMY  2009  . TUBAL LIGATION      MEDICATIONS: . Naltrexone-buPROPion HCl ER (CONTRAVE) 8-90 MG TB12  . acetaminophen (TYLENOL) 500 MG tablet  . albuterol (PROVENTIL HFA;VENTOLIN HFA) 108 (90 Base) MCG/ACT inhaler  . Alpha-Lipoic Acid 300 MG TABS  . atorvastatin (LIPITOR) 20 MG tablet  . BIOTIN PO  . CINNAMON PO  . co-enzyme Q-10 30 MG capsule  . dextromethorphan-guaiFENesin (MUCINEX DM) 30-600 MG 12hr tablet  . eletriptan (RELPAX) 20 MG tablet  . fluticasone (FLONASE) 50 MCG/ACT nasal spray  . glipiZIDE (GLUCOTROL) 10 MG tablet  . guaiFENesin-codeine (ROBITUSSIN AC) 100-10 MG/5ML syrup  . ibuprofen (ADVIL,MOTRIN) 200 MG tablet  . ibuprofen (ADVIL,MOTRIN) 600 MG tablet  . Insulin Glargine (BASAGLAR KWIKPEN) 100 UNIT/ML SOPN  . Insulin Pen Needle (B-D UF III MINI PEN NEEDLES) 31G X 5 MM MISC  . magnesium oxide (MAG-OX) 400 MG tablet  . metFORMIN (GLUCOPHAGE-XR) 500 MG 24 hr tablet  . promethazine (PHENERGAN) 12.5 MG tablet  . propranolol (INDERAL) 20 MG tablet  . quinapril-hydrochlorothiazide (ACCURETIC) 20-12.5 MG tablet  . sitaGLIPtin (JANUVIA) 100 MG tablet  . vitamin B-12 (CYANOCOBALAMIN) 1000 MCG tablet   No current facility-administered medications for this encounter.     If no changes, I anticipate pt can proceed with surgery as scheduled.   Willeen Cass, FNP-BC [facility]Anesthesiology Phone: [phone] 12/20/2017 4:01 PM

## 2017-12-25 ENCOUNTER — Ambulatory Visit (HOSPITAL_COMMUNITY): Payer: BLUE CROSS/BLUE SHIELD | Admitting: Anesthesiology

## 2017-12-25 ENCOUNTER — Encounter (HOSPITAL_COMMUNITY): Payer: Self-pay

## 2017-12-25 ENCOUNTER — Ambulatory Visit (HOSPITAL_COMMUNITY): Payer: BLUE CROSS/BLUE SHIELD | Admitting: Emergency Medicine

## 2017-12-25 ENCOUNTER — Other Ambulatory Visit: Payer: Self-pay

## 2017-12-25 ENCOUNTER — Encounter (HOSPITAL_COMMUNITY)
Admission: RE | Disposition: A | Discharge status: Home / Self Care | Payer: Self-pay | Source: Ambulatory Visit | Attending: Obstetrics and Gynecology

## 2017-12-25 ENCOUNTER — Ambulatory Visit (HOSPITAL_COMMUNITY)
Admission: RE | Admit: 2017-12-25 | Discharge: 2017-12-25 | Disposition: A | Discharge status: Home / Self Care | Payer: BLUE CROSS/BLUE SHIELD | Source: Ambulatory Visit | Attending: Obstetrics and Gynecology | Admitting: Obstetrics and Gynecology

## 2017-12-25 DIAGNOSIS — G5600 Carpal tunnel syndrome, unspecified upper limb: Secondary | ICD-10-CM | POA: Insufficient documentation

## 2017-12-25 DIAGNOSIS — G43909 Migraine, unspecified, not intractable, without status migrainosus: Secondary | ICD-10-CM | POA: Insufficient documentation

## 2017-12-25 DIAGNOSIS — N950 Postmenopausal bleeding: Secondary | ICD-10-CM | POA: Insufficient documentation

## 2017-12-25 DIAGNOSIS — Z833 Family history of diabetes mellitus: Secondary | ICD-10-CM | POA: Diagnosis not present

## 2017-12-25 DIAGNOSIS — E119 Type 2 diabetes mellitus without complications: Secondary | ICD-10-CM | POA: Diagnosis not present

## 2017-12-25 DIAGNOSIS — E785 Hyperlipidemia, unspecified: Secondary | ICD-10-CM | POA: Insufficient documentation

## 2017-12-25 DIAGNOSIS — Z9049 Acquired absence of other specified parts of digestive tract: Secondary | ICD-10-CM | POA: Insufficient documentation

## 2017-12-25 DIAGNOSIS — I447 Left bundle-branch block, unspecified: Secondary | ICD-10-CM | POA: Diagnosis not present

## 2017-12-25 DIAGNOSIS — Z7951 Long term (current) use of inhaled steroids: Secondary | ICD-10-CM | POA: Diagnosis not present

## 2017-12-25 DIAGNOSIS — Z8249 Family history of ischemic heart disease and other diseases of the circulatory system: Secondary | ICD-10-CM | POA: Diagnosis not present

## 2017-12-25 DIAGNOSIS — Z794 Long term (current) use of insulin: Secondary | ICD-10-CM | POA: Insufficient documentation

## 2017-12-25 DIAGNOSIS — I429 Cardiomyopathy, unspecified: Secondary | ICD-10-CM | POA: Insufficient documentation

## 2017-12-25 DIAGNOSIS — I10 Essential (primary) hypertension: Secondary | ICD-10-CM | POA: Diagnosis not present

## 2017-12-25 DIAGNOSIS — N840 Polyp of corpus uteri: Secondary | ICD-10-CM | POA: Insufficient documentation

## 2017-12-25 DIAGNOSIS — Z791 Long term (current) use of non-steroidal anti-inflammatories (NSAID): Secondary | ICD-10-CM | POA: Insufficient documentation

## 2017-12-25 DIAGNOSIS — Z79899 Other long term (current) drug therapy: Secondary | ICD-10-CM | POA: Insufficient documentation

## 2017-12-25 HISTORY — PX: DILATATION & CURETTAGE/HYSTEROSCOPY WITH MYOSURE: SHX6511

## 2017-12-25 SURGERY — DILATATION & CURETTAGE/HYSTEROSCOPY WITH MYOSURE
Anesthesia: General | Site: Vagina

## 2017-12-25 MED ORDER — ACETAMINOPHEN 160 MG/5ML PO SOLN: 325.0000 mg | ORAL | Status: DC | PRN

## 2017-12-25 MED ORDER — OXYCODONE HCL 5 MG PO TABS
5.0000 mg | ORAL_TABLET | Freq: Once | ORAL | Status: AC | PRN
  Administered 2017-12-25: 5 mg via ORAL

## 2017-12-25 MED ORDER — MIDAZOLAM HCL 2 MG/2ML IJ SOLN
INTRAMUSCULAR | Status: AC
  Filled 2017-12-25: qty 2

## 2017-12-25 MED ORDER — LIDOCAINE HCL (CARDIAC) PF 100 MG/5ML IV SOSY
PREFILLED_SYRINGE | INTRAVENOUS | Status: DC | PRN
  Administered 2017-12-25: 100 mg via INTRAVENOUS

## 2017-12-25 MED ORDER — IBUPROFEN 800 MG PO TABS: 800.0000 mg | ORAL_TABLET | Freq: Three times a day (TID) | ORAL | 0 refills | Status: DC | PRN

## 2017-12-25 MED ORDER — DEXAMETHASONE SODIUM PHOSPHATE 10 MG/ML IJ SOLN
INTRAMUSCULAR | Status: DC | PRN
  Administered 2017-12-25: 10 mg via INTRAVENOUS

## 2017-12-25 MED ORDER — OXYCODONE HCL 5 MG/5ML PO SOLN: 5.0000 mg | Freq: Once | ORAL | Status: AC | PRN

## 2017-12-25 MED ORDER — FENTANYL CITRATE (PF) 100 MCG/2ML IJ SOLN
INTRAMUSCULAR | Status: AC
  Filled 2017-12-25: qty 2

## 2017-12-25 MED ORDER — MEPERIDINE HCL 25 MG/ML IJ SOLN: 6.2500 mg | INTRAMUSCULAR | Status: DC | PRN

## 2017-12-25 MED ORDER — SCOPOLAMINE 1 MG/3DAYS TD PT72
MEDICATED_PATCH | TRANSDERMAL | Status: AC
  Administered 2017-12-25: 1.5 mg via TRANSDERMAL
  Filled 2017-12-25: qty 1

## 2017-12-25 MED ORDER — ONDANSETRON HCL 4 MG/2ML IJ SOLN: 4.0000 mg | Freq: Once | INTRAMUSCULAR | Status: DC | PRN

## 2017-12-25 MED ORDER — FENTANYL CITRATE (PF) 100 MCG/2ML IJ SOLN
INTRAMUSCULAR | Status: DC | PRN
  Administered 2017-12-25: 50 ug via INTRAVENOUS

## 2017-12-25 MED ORDER — KETOROLAC TROMETHAMINE 30 MG/ML IJ SOLN
INTRAMUSCULAR | Status: DC | PRN
  Administered 2017-12-25: 30 mg via INTRAVENOUS

## 2017-12-25 MED ORDER — LACTATED RINGERS IV SOLN
INTRAVENOUS | Status: DC
  Administered 2017-12-25: 125 mL/h via INTRAVENOUS

## 2017-12-25 MED ORDER — SODIUM CHLORIDE 0.9 % IR SOLN
Status: DC | PRN
  Administered 2017-12-25: 3000 mL

## 2017-12-25 MED ORDER — EPHEDRINE SULFATE-NACL 50-0.9 MG/10ML-% IV SOSY
PREFILLED_SYRINGE | INTRAVENOUS | Status: DC | PRN
  Administered 2017-12-25: 5 mg via INTRAVENOUS

## 2017-12-25 MED ORDER — ONDANSETRON HCL 4 MG/2ML IJ SOLN
INTRAMUSCULAR | Status: DC | PRN
  Administered 2017-12-25: 4 mg via INTRAVENOUS

## 2017-12-25 MED ORDER — PROPOFOL 10 MG/ML IV BOLUS
INTRAVENOUS | Status: DC | PRN
  Administered 2017-12-25: 20 mg via INTRAVENOUS
  Administered 2017-12-25: 180 mg via INTRAVENOUS

## 2017-12-25 MED ORDER — MIDAZOLAM HCL 2 MG/2ML IJ SOLN
INTRAMUSCULAR | Status: DC | PRN
  Administered 2017-12-25: 2 mg via INTRAVENOUS

## 2017-12-25 MED ORDER — FENTANYL CITRATE (PF) 100 MCG/2ML IJ SOLN: 25.0000 ug | INTRAMUSCULAR | Status: DC | PRN

## 2017-12-25 MED ORDER — ACETAMINOPHEN 325 MG PO TABS: 325.0000 mg | ORAL_TABLET | ORAL | Status: DC | PRN

## 2017-12-25 MED ORDER — HYDROCODONE-ACETAMINOPHEN 5-325 MG PO TABS: 1.0000 | ORAL_TABLET | Freq: Four times a day (QID) | ORAL | 0 refills | Status: DC | PRN

## 2017-12-25 MED ORDER — SCOPOLAMINE 1 MG/3DAYS TD PT72
1.0000 | MEDICATED_PATCH | Freq: Once | TRANSDERMAL | Status: DC
  Administered 2017-12-25: 1.5 mg via TRANSDERMAL

## 2017-12-25 MED ORDER — OXYCODONE HCL 5 MG PO TABS
ORAL_TABLET | ORAL | Status: AC
  Filled 2017-12-25: qty 1

## 2017-12-25 SURGICAL SUPPLY — 14 items
CATH ROBINSON RED A/P 16FR (CATHETERS) ×3 IMPLANT
DEVICE MYOSURE LITE (MISCELLANEOUS) ×2 IMPLANT
DEVICE MYOSURE REACH (MISCELLANEOUS) IMPLANT
GLOVE BIOGEL PI IND STRL 6.5 (GLOVE) ×1 IMPLANT
GLOVE BIOGEL PI IND STRL 7.0 (GLOVE) ×1 IMPLANT
GLOVE BIOGEL PI INDICATOR 6.5 (GLOVE) ×2
GLOVE BIOGEL PI INDICATOR 7.0 (GLOVE) ×2
GLOVE ECLIPSE 6.5 STRL STRAW (GLOVE) ×3 IMPLANT
GOWN STRL REUS W/TWL LRG LVL3 (GOWN DISPOSABLE) ×6 IMPLANT
KIT PROCEDURE FLUENT (KITS) ×3 IMPLANT
PACK VAGINAL MINOR WOMEN LF (CUSTOM PROCEDURE TRAY) ×3 IMPLANT
PAD OB MATERNITY 4.3X12.25 (PERSONAL CARE ITEMS) ×3 IMPLANT
SEAL ROD LENS SCOPE MYOSURE (ABLATOR) ×3 IMPLANT
TOWEL OR 17X24 6PK STRL BLUE (TOWEL DISPOSABLE) ×6 IMPLANT

## 2017-12-25 NOTE — Transfer of Care (Signed)
Immediate Anesthesia Transfer of Care Note  Patient: Sandra [name]  Procedure(s) Performed: DILATATION & CURETTAGE/HYSTEROSCOPY   POLYPECTOMY WITH MYOSURE (N/A Vagina )  Patient Location: PACU  Anesthesia Type:General  Level of Consciousness: awake, alert , oriented and patient cooperative  Airway & Oxygen Therapy: Patient Spontanous Breathing and Patient connected to nasal cannula oxygen  Post-op Assessment: Report given to RN and Post -op Vital signs reviewed and stable  Post vital signs: Reviewed and stable  Last Vitals:  Vitals Value Taken Time  BP    Temp    Pulse    Resp    SpO2      Last Pain:  Vitals:   12/25/17 1333  TempSrc: Oral  PainSc: 0-No pain      Patients Stated Pain Goal: 3 (35/68/61 6837)  Complications: No apparent anesthesia complications

## 2017-12-25 NOTE — Anesthesia Postprocedure Evaluation (Signed)
Anesthesia Post Note  Patient: Sandra [name]  Procedure(s) Performed: DILATATION & CURETTAGE/HYSTEROSCOPY   POLYPECTOMY WITH MYOSURE (N/A Vagina )     Patient location during evaluation: PACU Anesthesia Type: General Level of consciousness: awake and alert Pain management: pain level controlled Vital Signs Assessment: post-procedure vital signs reviewed and stable Respiratory status: spontaneous breathing, nonlabored ventilation, respiratory function stable and patient connected to nasal cannula oxygen Cardiovascular status: blood pressure returned to baseline and stable Postop Assessment: no apparent nausea or vomiting Anesthetic complications: no    Last Vitals:  Vitals:   12/25/17 1600 12/25/17 1645  BP: 134/78 140/74  Pulse: 80 81  Resp: 13 14  Temp:  36.7 C  SpO2: 97% 99%    Last Pain:  Vitals:   12/25/17 1645  TempSrc:   PainSc: 0-No pain   Pain Goal: Patients Stated Pain Goal: 4 (12/25/17 1600)               Chey Rachels

## 2017-12-25 NOTE — Discharge Instructions (Signed)
°  Post Anesthesia Home Care Instructions  Activity: Get plenty of rest for the remainder of the day. A responsible individual must stay with you for 24 hours following the procedure.  For the next 24 hours, DO NOT: -Drive a car -Operate machinery -Drink alcoholic beverages -Take any medication unless instructed by your physician -Make any legal decisions or sign important papers.  Meals: Start with liquid foods such as gelatin or soup. Progress to regular foods as tolerated. Avoid greasy, spicy, heavy foods. If nausea and/or vomiting occur, drink only clear liquids until the nausea and/or vomiting subsides. Call your physician if vomiting continues.  Special Instructions/Symptoms: Your throat may feel dry or sore from the anesthesia or the breathing tube placed in your throat during surgery. If this causes discomfort, gargle with warm salt water. The discomfort should disappear within 24 hours.  If you had a scopolamine patch placed behind your ear for the management of post- operative nausea and/or vomiting:  1. The medication in the patch is effective for 72 hours, after which it should be removed.  Wrap patch in a tissue and discard in the trash. Wash hands thoroughly with soap and water. 2. You may remove the patch earlier than 72 hours if you experience unpleasant side effects which may include dry mouth, dizziness or visual disturbances. 3. Avoid touching the patch. Wash your hands with soap and water after contact with the patch.        DISCHARGE INSTRUCTIONS: D&C / D&E The following instructions have been prepared to help you care for yourself upon your return home.   Personal hygiene:  Use sanitary pads for vaginal drainage, not tampons.  Shower the day after your procedure.  NO tub baths, pools or Jacuzzis for 2-3 weeks.  Wipe front to back after using the bathroom.  Activity and limitations:  Do NOT drive or operate any equipment for 24 hours. The effects of anesthesia  are still present and drowsiness may result.  Do NOT rest in bed all day.  Walking is encouraged.  Walk up and down stairs slowly.  You may resume your normal activity in one to two days or as indicated by your physician.  Sexual activity: NO intercourse for at least 2 weeks after the procedure, or as indicated by your physician.  Diet: Eat a light meal as desired this evening. You may resume your usual diet tomorrow.  Return to work: You may resume your work activities in one to two days or as indicated by your doctor.  What to expect after your surgery: Expect to have vaginal bleeding/discharge for 2-3 days and spotting for up to 10 days. It is not unusual to have soreness for up to 1-2 weeks. You may have a slight burning sensation when you urinate for the first day. Mild cramps may continue for a couple of days. You may have a regular period in 2-6 weeks.  Call your doctor for any of the following:  Excessive vaginal bleeding, saturating and changing one pad every hour.  Inability to urinate 6 hours after discharge from hospital.  Pain not relieved by pain medication.  Fever of 100.4 F or greater.  Unusual vaginal discharge or odor.       

## 2017-12-25 NOTE — Anesthesia Preprocedure Evaluation (Addendum)
Anesthesia Evaluation  Patient identified by MRN, date of birth, ID band Patient awake    Reviewed: Allergy & Precautions, H&P , NPO status , Patient's Chart, lab work & pertinent test results, reviewed documented beta blocker date and time   Airway Mallampati: I  TM Distance: >3 FB Neck ROM: full    Dental no notable dental hx. (+) Poor Dentition,    Pulmonary asthma ,    Pulmonary exam normal breath sounds clear to auscultation       Cardiovascular Exercise Tolerance: Good hypertension, + dysrhythmias  Rhythm:regular Rate:Normal  Cardiomyopathy   ECHO 19' Impressions: Normal LV size with EF 40-45%. Septal and inferior hypokinesis   with septal-lateral dyssynchrony consistent with LBBB. Normal RV   size and systolic function. No significant valvular   abnormalities.   Neuro/Psych  Headaches,  Neuromuscular disease negative psych ROS   GI/Hepatic negative GI ROS, Neg liver ROS,   Endo/Other  negative endocrine ROSdiabetes, Type 2  Renal/GU negative Renal ROS  negative genitourinary   Musculoskeletal   Abdominal   Peds  Hematology negative hematology ROS (+)   Anesthesia Other Findings   Reproductive/Obstetrics negative OB ROS                           Anesthesia Physical Anesthesia Plan  ASA: III  Anesthesia Plan: General   Post-op Pain Management:    Induction: Intravenous  PONV Risk Score and Plan:   Airway Management Planned: LMA and Oral ETT  Additional Equipment:   Intra-op Plan:   Post-operative Plan:   Informed Consent: I have reviewed the patients History and Physical, chart, labs and discussed the procedure including the risks, benefits and alternatives for the proposed anesthesia with the patient or authorized representative who has indicated his/her understanding and acceptance.   Dental Advisory Given  Plan Discussed with: CRNA, Anesthesiologist and  Surgeon  Anesthesia Plan Comments: ( )       Anesthesia Quick Evaluation

## 2017-12-25 NOTE — Op Note (Signed)
Preoperative diagnosis: PMB, endometrial polyp  Postop diagnosis: as above.  Procedure: Hysteroscopic polypectomy with Jacklynn Barnacle, D&C Anesthesia General via LMA  Surgeon: Tiana Loft, MD  Assistant: none IV fluids : 557ml Estimated blood loss : 71ml Urine output: straight catheter preop  : 21JD Complications none  Condition stable  Disposition PACU  Specimen: endometrial polyp with endometrial curettings   Procedure  Indication: PMB. Office sono noted thickened endometrium and SIS showed about 1cm endometrial polyp. Patient was counseled on risks/ complications including infection, bleeding, damage to internal organs, she understood and agrees, gave informed written consent.  Patient was brought to the operating room with IV running. Time out was carried out.  She underwent general anesthesia via LMA without complications. She was then placed in the dorsolithotomy position. The patient was prepped and draped in standard fashion. Bladder was catheterized once. Bimanual exam revealed uterus to be anteverted and normal size. Speculum was placed and cervix was grasped with single-tooth tenaculum.  The uterus was sounded to 8 cm. Cervical os was dilated to 15 Pratt dilator. Hysteroscope was introduced in the uterine cavity under vision.  Findings: anterior polyp noted in endometrial cavity.  The myosure was then advanced and polyp removed.  Uterine cavity otherwise wnl.  Machine on for 40 seconds. Specimen sent to path. Hysteroscope/myosure was removed.  All tissue sent to path.  Fluid deficit 100 cc.  All counts are correct x2. No complications. Patient was made supine dorsal anesthesia and brought to the recovery room in stable condition.  Patient will be discharged home today. Discharge meds norco and percocet. Follow up in 2 weeks in office. Warning signs of infection and excessive bleeding reviewed.

## 2017-12-25 NOTE — Anesthesia Procedure Notes (Signed)
Procedure Name: LMA Insertion Date/Time: 12/25/2017 2:31 PM Performed by: Raenette Rover, CRNA Pre-anesthesia Checklist: Patient identified, Emergency Drugs available, Patient being monitored and Suction available Patient Re-evaluated:Patient Re-evaluated prior to induction Oxygen Delivery Method: Circle system utilized Preoxygenation: Pre-oxygenation with 100% oxygen Induction Type: IV induction LMA: LMA inserted LMA Size: 4.0 Number of attempts: 1 Placement Confirmation: positive ETCO2,  CO2 detector and breath sounds checked- equal and bilateral Tube secured with: Tape Dental Injury: Teeth and Oropharynx as per pre-operative assessment

## 2017-12-25 NOTE — H&P (Addendum)
Sandra [name] is an 52 y.o. female G2P1011 with h/o PMB.  U/s showed thickened endometrium and SIS performed and suspicion for endometrial polyp.  Pt here for polypectomy.  Pertinent Gynecological History: LMP: 2018  Menstrual History:  No LMP recorded. (Menstrual status: Irregular Periods).    Past Medical History:  Diagnosis Date  . Acute pain of left shoulder 06/07/2017  . Asthma    as a child  . Cardiomyopathy ([place])    likely nonischemic DCM per Dr. Johnsie Cancel note 04/2017  . Carpal tunnel syndrome   . Diabetes mellitus type 2, uncontrolled (HCC) DX: 1999   Started as gestational diabetes  . ESSENTIAL HYPERTENSION 07/08/2006  . Gallstones   . HSV (herpes simplex virus) anogenital infection   . HYPERLIPIDEMIA 08/20/2007  . LBBB (left bundle branch block)   . Menorrhagia   . Migraine    Normal CT head (08/06/2006)  . Swelling of abdominal wall 03/02/2014    Past Surgical History:  Procedure Laterality Date  . CHOLECYSTECTOMY  2009  . TUBAL LIGATION      Family History  Problem Relation Age of Onset  . Hypertension Father   . Diabetes Father     Social History:  reports that she has never smoked. She has never used smokeless tobacco. She reports that she drinks alcohol. She reports that she does not use drugs.  Allergies: No Known Allergies  Medications Prior to Admission  Medication Sig Dispense Refill Last Dose  . acetaminophen (TYLENOL) 500 MG tablet Take 1,000 mg by mouth every 6 (six) hours as needed for moderate pain or headache.   Past Week at Unknown time  . albuterol (PROVENTIL HFA;VENTOLIN HFA) 108 (90 Base) MCG/ACT inhaler Inhale 1-2 puffs into the lungs every 6 (six) hours as needed for wheezing or shortness of breath. 1 Inhaler 0 Past Week at Unknown time  . Alpha-Lipoic Acid 300 MG TABS Take 300 mg by mouth 2 (two) times daily.   Past Week at Unknown time  . atorvastatin (LIPITOR) 20 MG tablet TAKE 1 TABLET BY MOUTH ONCE DAILY AT  6  PM 30 tablet 6 Past  Week at Unknown time  . BIOTIN PO Take 500 mg by mouth daily.   Past Week at Unknown time  . CINNAMON PO Take 1,000 mg by mouth 2 (two) times daily.   Past Week at Unknown time  . co-enzyme Q-10 30 MG capsule Take 30 mg by mouth daily.   Past Week at Unknown time  . fluticasone (FLONASE) 50 MCG/ACT nasal spray Place into both nostrils as directed.   Past Week at Unknown time  . guaiFENesin-codeine (ROBITUSSIN AC) 100-10 MG/5ML syrup Take 10 mLs by mouth at bedtime as needed for cough. 118 mL 0 Past Month at Unknown time  . Insulin Glargine (BASAGLAR KWIKPEN) 100 UNIT/ML SOPN Inject 0.08 mLs (8 Units total) into the skin at bedtime. 45 mL 3 12/24/2017 at 1930  . metFORMIN (GLUCOPHAGE-XR) 500 MG 24 hr tablet Take 2 tablets (1,000 mg total) by mouth 2 (two) times daily. 360 tablet 3 12/24/2017 at 1830  . Naltrexone-buPROPion HCl ER (CONTRAVE) 8-90 MG TB12 Take 1 tablet by mouth 2 (two) times daily.   Past Month at Unknown time  . propranolol (INDERAL) 20 MG tablet TAKE 1 TABLET BY MOUTH TWICE DAILY 180 tablet 3 12/25/2017 at 0730  . quinapril-hydrochlorothiazide (ACCURETIC) 20-12.5 MG tablet Take 1 tablet by mouth daily. 90 tablet 1 12/24/2017 at 1830  . sitaGLIPtin (JANUVIA) 100 MG tablet Take  1 tablet (100 mg total) by mouth daily. 90 tablet 3 12/24/2017 at 1830  . vitamin B-12 (CYANOCOBALAMIN) 1000 MCG tablet Take 1,000 mcg by mouth daily.   Past Week at Unknown time  . dextromethorphan-guaiFENesin (MUCINEX DM) 30-600 MG 12hr tablet Take 1 tablet 2 (two) times daily by mouth. (Patient not taking: Reported on 04/26/2017) 10 tablet 0 Not Taking  . eletriptan (RELPAX) 20 MG tablet TAKE ONE TABLET BY MOUTH AS NEEDED FOR MIGRAINE OR HEADACHE. MAY REPEAT IN 2 HOURS IF HEADACHE PERSISTS OR RECURS. 10 tablet 0 More than a month at Unknown time  . glipiZIDE (GLUCOTROL) 10 MG tablet Take 1 tablet (10 mg total) by mouth 2 (two) times daily before a meal. 180 tablet 3 12/23/2017  . ibuprofen (ADVIL,MOTRIN) 200 MG tablet  Take 200 mg by mouth every 6 (six) hours as needed for mild pain.   More than a month at Unknown time  . ibuprofen (ADVIL,MOTRIN) 600 MG tablet Take 1 tablet (600 mg total) by mouth every 6 (six) hours as needed. 30 tablet 0 More than a month at Unknown time  . Insulin Pen Needle (B-D UF III MINI PEN NEEDLES) 31G X 5 MM MISC 1 Stick by Does not apply route daily. 11.69, insulin requirement 30 each 3 Taking  . magnesium oxide (MAG-OX) 400 MG tablet Take 400 mg by mouth daily.   More than a month at Unknown time  . promethazine (PHENERGAN) 12.5 MG tablet Take 1 tablet (12.5 mg total) by mouth every 6 (six) hours as needed for nausea. (Patient not taking: Reported on 04/26/2017) 12 tablet 0 Not Taking    ROS SOB/chest pain/ HA/ vision changes/ LE pain or swelling/ etc.  Pulse 82, temperature 98.3 F (36.8 C), temperature source Oral, resp. rate 20, SpO2 99 %. Physical Exam A&O x 3 HEENT : grossly wnl Lungs :ctab CV : rrr Abdo : soft, nt, nd Extr : tr edema, nt bilat Pelvic : deferred  CBC Latest Ref Rng & Units 12/18/2017 03/18/2017 02/23/2016  WBC 4.0 - 10.5 K/uL 7.4 8.2 7.7  Hemoglobin 12.0 - 15.0 g/dL 11.9(L) 12.4 10.0(L)  Hematocrit 36.0 - 46.0 % 36.4 36.4 30.3(L)  Platelets 150 - 400 K/uL 345 315 221   CMP Latest Ref Rng & Units 12/18/2017 03/18/2017 02/23/2016  Glucose 70 - 99 mg/dL 115(H) 307(H) 298(H)  BUN 6 - 20 mg/dL 9 8 7   Creatinine 0.44 - 1.00 mg/dL 0.78 0.73 0.80  Sodium 135 - 145 mmol/L 135 135 138  Potassium 3.5 - 5.1 mmol/L 3.8 4.3 3.7  Chloride 98 - 111 mmol/L 101 103 102  CO2 22 - 32 mmol/L 26 21(L) 25  Calcium 8.9 - 10.3 mg/dL 9.6 9.2 8.7(L)  Total Protein 6.5 - 8.1 g/dL - - -  Total Bilirubin 0.3 - 1.2 mg/dL - - -  Alkaline Phos 38 - 126 U/L - - -  AST 15 - 41 U/L - - -  ALT 14 - 54 U/L - - -     No results found for this or any previous visit (from the past 24 hour(s)).  No results found.  Assessment/Plan: 52 y/o G2P1 with h/o pmb and suspected endometrial  biopsy. .1. Proceed to OR for D&C, hysteroscopy, polypectomy; pt understands risk of bleeding, infection, uterine perforation, risk of further surgery, risk of anesthesia, risk of blood clot.  Patient agrees and signed consent.   2. Uncontrolled DM, a1c 7  Last month - f/u with pcp 3. htn - f/u  with pcp  Charyl Bigger 12/25/2017, 2:03 PM

## 2017-12-26 ENCOUNTER — Encounter (HOSPITAL_COMMUNITY): Payer: Self-pay | Admitting: Obstetrics and Gynecology

## 2017-12-26 LAB — GLUCOSE, CAPILLARY
Glucose-Capillary: 143 mg/dL — ABNORMAL HIGH (ref 70–99)
Glucose-Capillary: 146 mg/dL — ABNORMAL HIGH (ref 70–99)

## 2017-12-31 ENCOUNTER — Other Ambulatory Visit: Payer: Self-pay | Admitting: Internal Medicine

## 2018-01-01 NOTE — Telephone Encounter (Signed)
Refill Approved

## 2018-01-04 ENCOUNTER — Other Ambulatory Visit: Payer: Self-pay | Admitting: Internal Medicine

## 2018-01-07 ENCOUNTER — Encounter: Payer: Self-pay | Admitting: Internal Medicine

## 2018-01-07 ENCOUNTER — Other Ambulatory Visit: Payer: Self-pay

## 2018-01-07 ENCOUNTER — Ambulatory Visit (INDEPENDENT_AMBULATORY_CARE_PROVIDER_SITE_OTHER): Payer: BLUE CROSS/BLUE SHIELD | Admitting: Internal Medicine

## 2018-01-07 VITALS — BP 129/70 | HR 91 | Temp 98.2°F | Ht 60.0 in | Wt 199.4 lb

## 2018-01-07 DIAGNOSIS — E1169 Type 2 diabetes mellitus with other specified complication: Secondary | ICD-10-CM

## 2018-01-07 DIAGNOSIS — I110 Hypertensive heart disease with heart failure: Secondary | ICD-10-CM

## 2018-01-07 DIAGNOSIS — I5089 Other heart failure: Secondary | ICD-10-CM | POA: Diagnosis not present

## 2018-01-07 DIAGNOSIS — Z79899 Other long term (current) drug therapy: Secondary | ICD-10-CM

## 2018-01-07 DIAGNOSIS — Z7984 Long term (current) use of oral hypoglycemic drugs: Secondary | ICD-10-CM

## 2018-01-07 DIAGNOSIS — E669 Obesity, unspecified: Secondary | ICD-10-CM | POA: Diagnosis not present

## 2018-01-07 DIAGNOSIS — I10 Essential (primary) hypertension: Secondary | ICD-10-CM

## 2018-01-07 DIAGNOSIS — L659 Nonscarring hair loss, unspecified: Secondary | ICD-10-CM

## 2018-01-07 DIAGNOSIS — E785 Hyperlipidemia, unspecified: Secondary | ICD-10-CM

## 2018-01-07 DIAGNOSIS — L658 Other specified nonscarring hair loss: Secondary | ICD-10-CM

## 2018-01-07 MED ORDER — ATORVASTATIN CALCIUM 40 MG PO TABS: 40.0000 mg | ORAL_TABLET | Freq: Every day | ORAL | 3 refills | Status: DC

## 2018-01-07 NOTE — Telephone Encounter (Signed)
Refill Approved

## 2018-01-07 NOTE — Progress Notes (Signed)
    CC: Diabetes, Hypertension, Hyperlipemia, Hair loss  HPI:  [name] is a 52 y.o. F with PMHx listed below presenting for Diabetes, Hypertension, Hyperlipemia, Hair loss. Please see the A&P for the status of the patient's chronic medical problems.  Past Medical History:  Diagnosis Date  . Acute pain of left shoulder 06/07/2017  . Asthma    as a child  . 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 Johnsie Cancel note 04/2017  . Carpal tunnel syndrome   . Diabetes mellitus type 2, uncontrolled (HCC) DX: 1999   Started as gestational diabetes  . ESSENTIAL HYPERTENSION 07/08/2006  . Gallstones   . HSV (herpes simplex virus) anogenital infection   . HYPERLIPIDEMIA 08/20/2007  . LBBB (left bundle branch block)   . Menorrhagia   . Migraine    Normal CT head (08/06/2006)  . Swelling of abdominal wall 03/02/2014   Review of Systems:  Performed and all others negative.  Physical Exam:   Vitals:   01/07/18 1457  BP: 129/70  Pulse: 91  Temp: 98.2 F (36.8 C)  TempSrc: Oral  SpO2: 99%  Weight: 199 lb 6.4 oz (90.4 kg)  Height: 5' (1.524 m)   Physical Exam Constitutional:      Appearance: Normal appearance. She is not ill-appearing.  HENT:     Head:     Comments: Hair thinning anteriorly and evidently spreading posteriorly. Hair loss is bilateral and symmetrical. Unable to evaluate apical scalp 2/2 hair extensions/styling. No abnormal skin lesions or changes. No patchy hair loss or sharply demarcated areas of hair loss. Cardiovascular:     Rate and Rhythm: Normal rate and regular rhythm.     Pulses: Normal pulses.     Heart sounds: Normal heart sounds.  Pulmonary:     Effort: Pulmonary effort is normal. No respiratory distress.     Breath sounds: Normal breath sounds.  Abdominal:     General: Bowel sounds are normal. There is no distension.     Palpations: Abdomen is soft.     Tenderness: There is no abdominal tenderness.  Musculoskeletal:        General: No  swelling or deformity.  Skin:    General: Skin is warm and dry.    Assessment & Plan:   See Encounters Tab for problem based charting.  Patient discussed with Dr. Angelia Mould

## 2018-01-07 NOTE — Patient Instructions (Addendum)
Thank you for allowing [name] to care for you  For your high blood pressure - This remains well controlled  For your Diabetes - A1c stable at 7.1, will recheck at next visit - We discussed medication changes and will continue this discussion at your next visit to help prevent low blood sugars and help with heart health  For your high cholesterol - We increased you dose of Atorvastatin today to 40mg  Daily (make sure to only pick up this dose)  Please have you OB/GYN send [name] their records  Please follow up in about 3 months

## 2018-01-08 ENCOUNTER — Encounter: Payer: Self-pay | Admitting: Internal Medicine

## 2018-01-08 DIAGNOSIS — L658 Other specified nonscarring hair loss: Secondary | ICD-10-CM

## 2018-01-08 HISTORY — DX: Other specified nonscarring hair loss: L65.8

## 2018-01-08 NOTE — Progress Notes (Signed)
Internal Medicine Clinic Attending  Case discussed with Dr. Melvin  at the time of the visit.  We reviewed the resident's history and exam and pertinent patient test results.  I agree with the assessment, diagnosis, and plan of care documented in the resident's note.  

## 2018-01-08 NOTE — Assessment & Plan Note (Signed)
Patient complained of hair loss at the end of her last visit and we discussed this further today. She states it has been happening gradually but seems to be increasing to her. She states she has a family history of hair loss in her mother.   On exam, Hair thinning anteriorly and evidently spreading posteriorly. Hair loss is bilateral and symmetrical. Unable to evaluate apical scalp 2/2 hair extensions/styling. No abnormal skin lesions or changes. No patchy hair loss or sharply demarcated areas of hair loss.  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 Will continue to monitor and discuss possible treatment options for patient at next follow up, as she is interested in doing what she can to address this. - Continue to monitor

## 2018-01-08 NOTE — Assessment & Plan Note (Signed)
Patient has history of Hyperlipidemia and  On her last Lipid panel  In may: Total 138, HDL 31, LDL 89. Patient has ASCVD risk of 14.5% and with risk greater than 7% is recommended to be on high intensity statin. We discuss increasing Atorvastatin to 40mg  Daily, and she is agreeable. - Atorvastatin 40mg  Daily

## 2018-01-08 NOTE — Assessment & Plan Note (Signed)
A1c at recent Pre-Procedure labs was 7.1. This is stable from previous of 7.3. She has continue to work hard on diet and exercise. She was seen on 11/22 due to low blood sugars and her Basaglar was reduced to 8U Daily at that times. She denies further low blood sugars.             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 Patient remains resistant to medication changes, and declines any injectables. She states she has tried Invokana in the past and did not like it, but may be open to trying Jardiance in the future, especially with the reduced risk for low blood sugar and ability to combine this and metformin into a single pill. -Metformin1000mg  BID -Januvia100mg  Daily - Glipizide10mg  BID -Basaglar8 unitsqhs -ContinueBlood sugar log -ContinueLifestyle modifcations -Return to clinic in57months, will further discuss switching glipizide to Jardiance at that time

## 2018-01-08 NOTE — Assessment & Plan Note (Signed)
BP today remains controlled at 129/70. We will continue current regimen. Patient continues to work on lifestyle modifications and weight loss. - Quinapril-HCTZ 20-12.5mg  Daily - Propranolol 20mg  BID - Continue lifestyle modifications

## 2018-01-09 ENCOUNTER — Encounter: Payer: BLUE CROSS/BLUE SHIELD | Admitting: Internal Medicine

## 2018-01-19 ENCOUNTER — Other Ambulatory Visit: Payer: Self-pay | Admitting: Internal Medicine

## 2018-01-19 DIAGNOSIS — E669 Obesity, unspecified: Principal | ICD-10-CM

## 2018-01-19 DIAGNOSIS — E1169 Type 2 diabetes mellitus with other specified complication: Secondary | ICD-10-CM

## 2018-01-24 NOTE — Telephone Encounter (Signed)
Refill approved.

## 2018-02-03 IMAGING — DX DG CHEST 2V
2 series · 2 of 2 positions shown · non-contrast
Comparison: 02/21/2016

CLINICAL DATA: Chills body aches and fever for 4 days.

EXAM:
CHEST  2 VIEW

[chest pa]
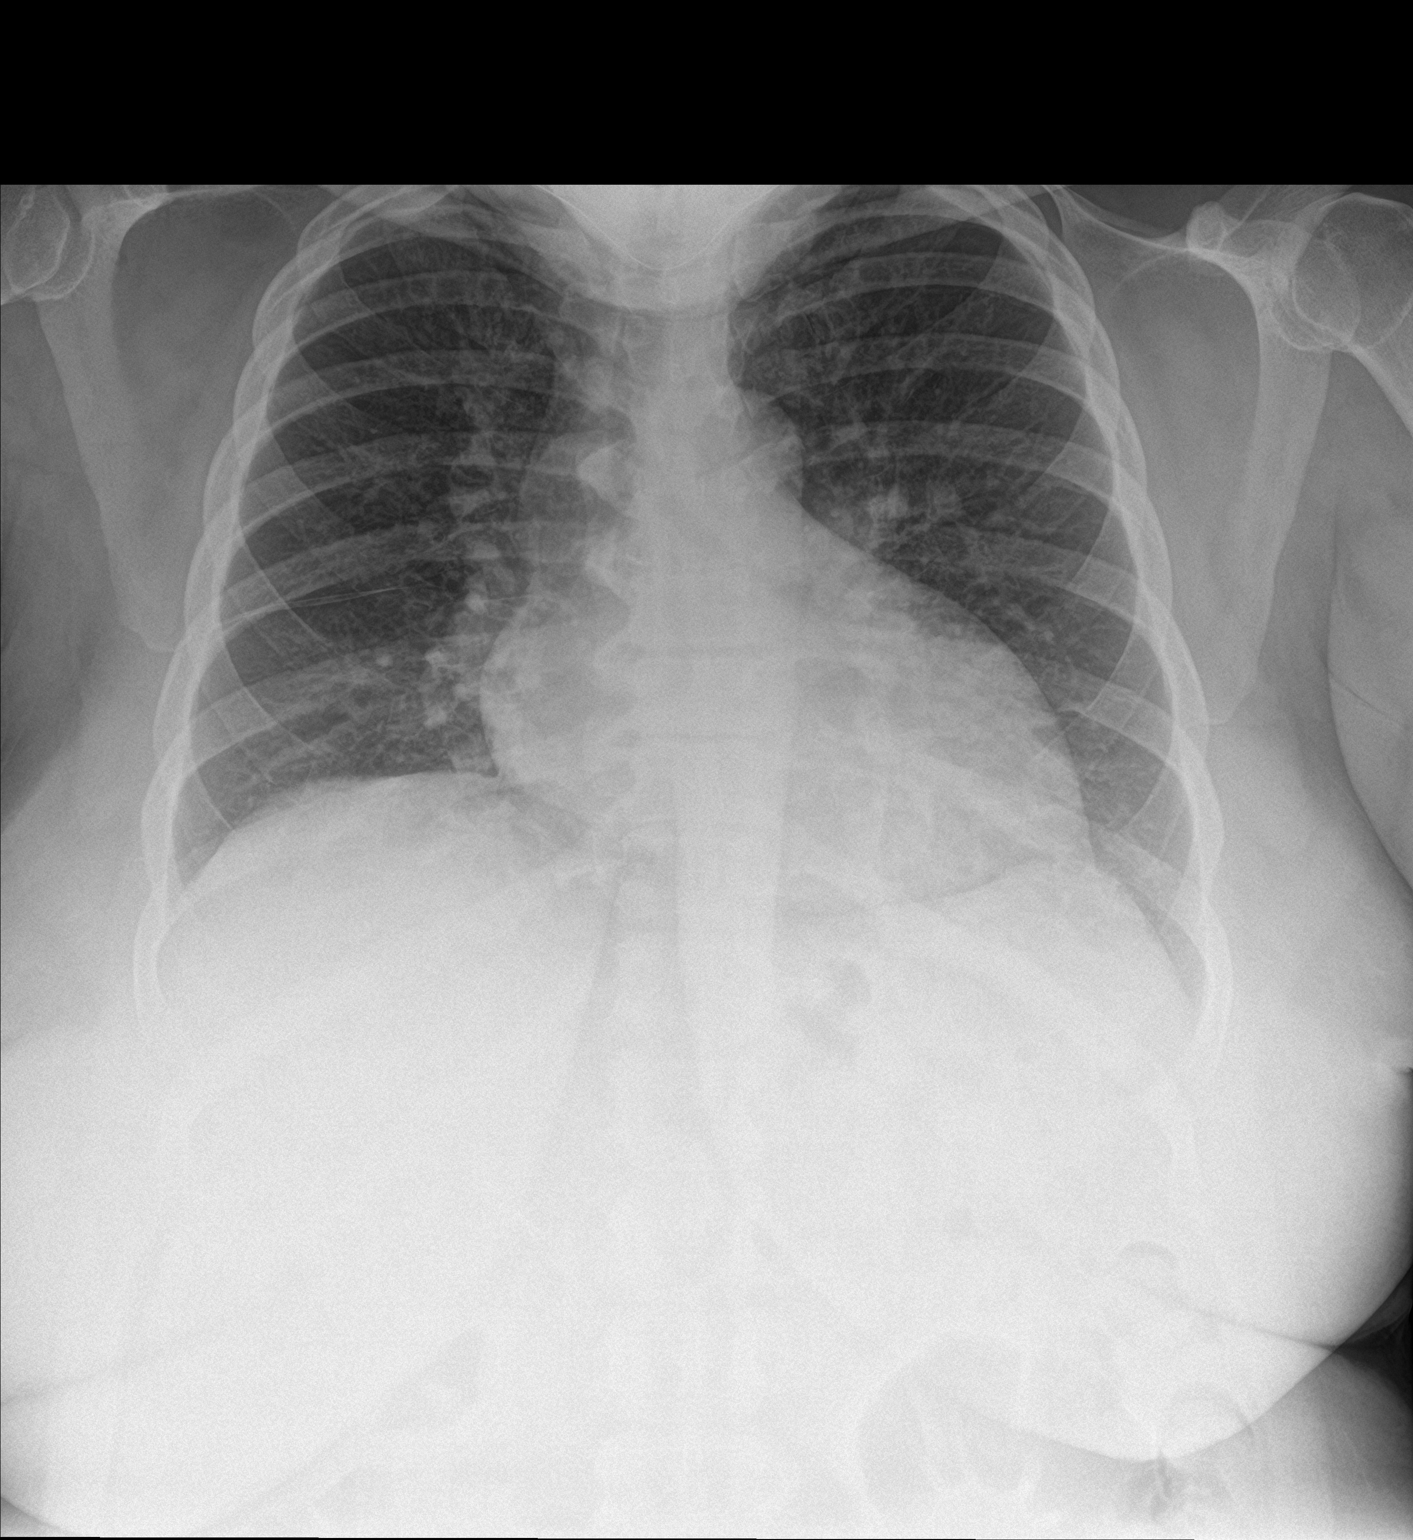

[chest lat]
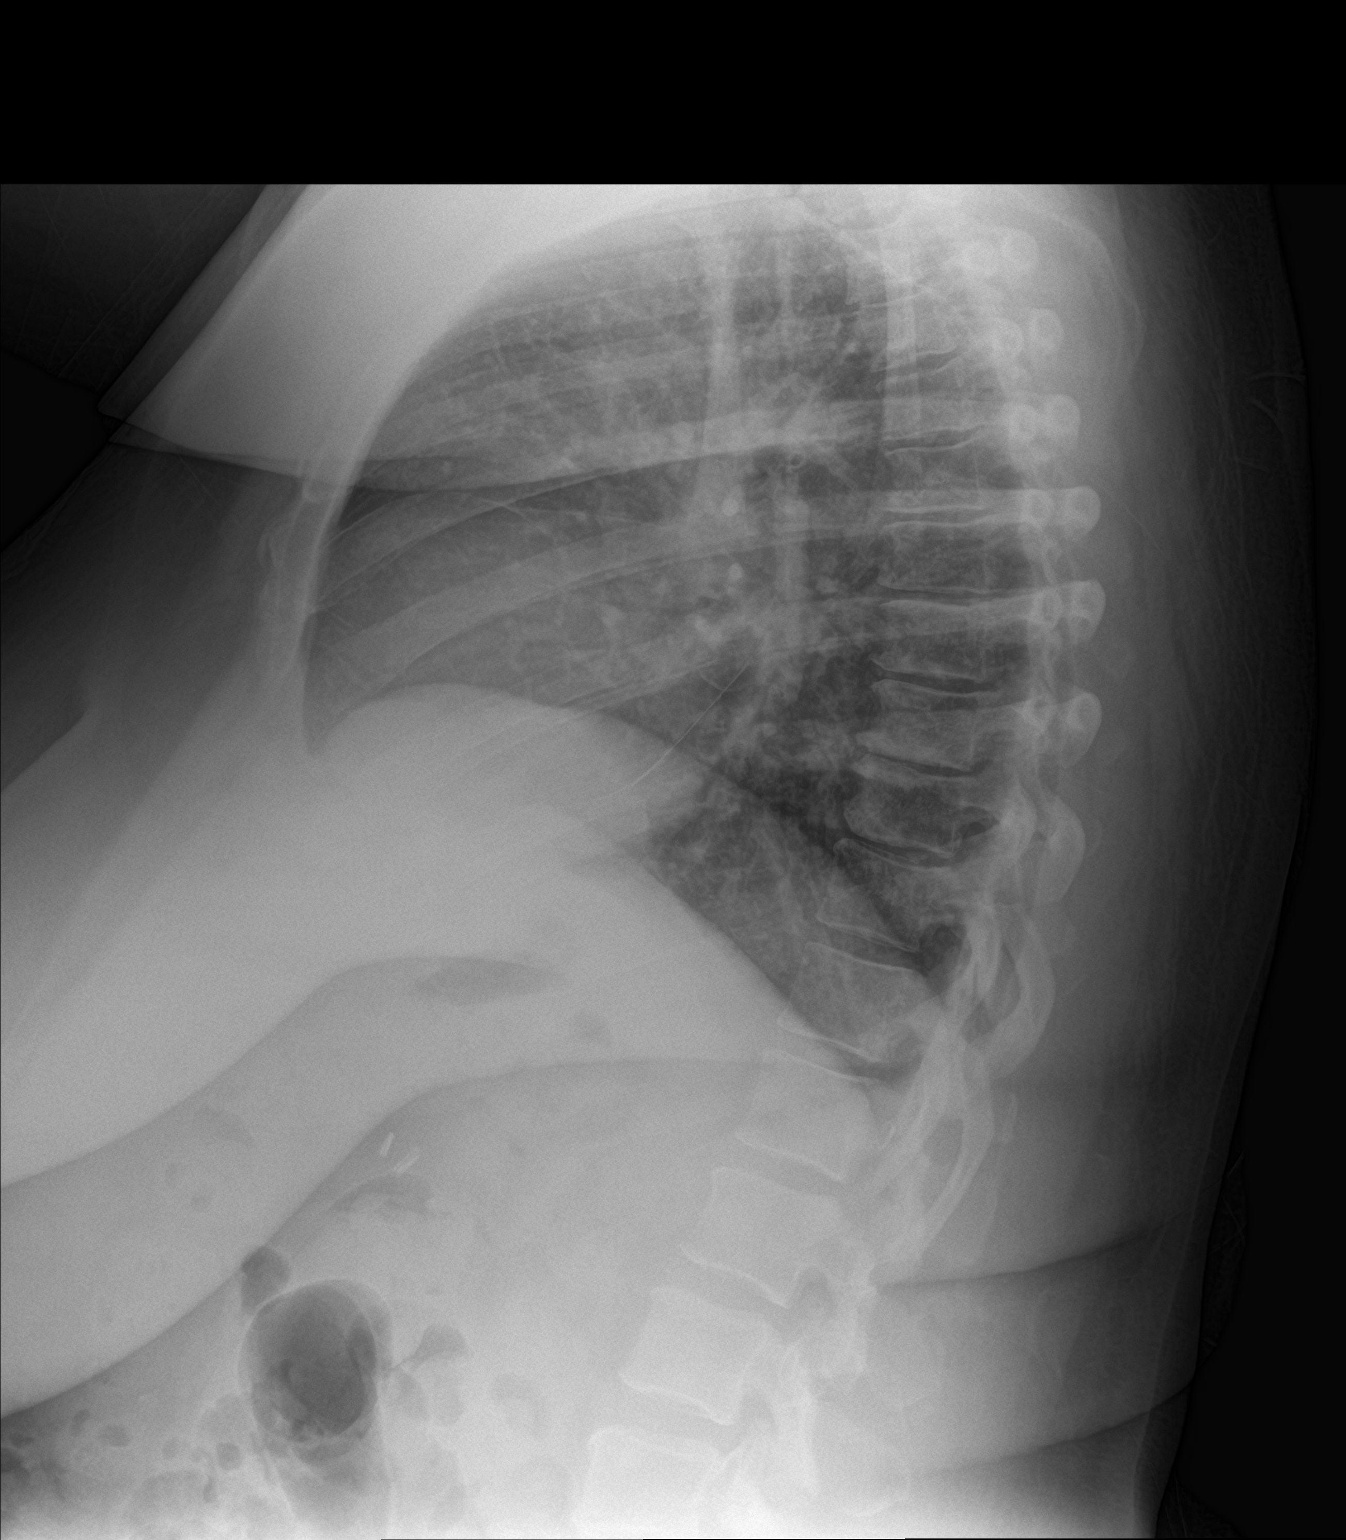

[2 of 2 positions shown; findings below may reference images not displayed]

FINDINGS: Unchanged mild cardiomegaly. The lungs are clear. The pulmonary
vasculature is normal. There is no pleural effusion. Hilar and
mediastinal contours are unremarkable and unchanged.
IMPRESSION: Stable cardiomegaly.  No acute findings.

## 2018-04-01 ENCOUNTER — Other Ambulatory Visit: Payer: Self-pay | Admitting: Internal Medicine

## 2018-04-01 ENCOUNTER — Encounter: Payer: Self-pay | Admitting: Internal Medicine

## 2018-04-01 ENCOUNTER — Ambulatory Visit (INDEPENDENT_AMBULATORY_CARE_PROVIDER_SITE_OTHER): Payer: BLUE CROSS/BLUE SHIELD | Admitting: Internal Medicine

## 2018-04-01 VITALS — BP 154/69 | HR 91 | Temp 99.0°F | Wt 204.0 lb

## 2018-04-01 DIAGNOSIS — J029 Acute pharyngitis, unspecified: Secondary | ICD-10-CM

## 2018-04-01 DIAGNOSIS — R05 Cough: Secondary | ICD-10-CM | POA: Diagnosis not present

## 2018-04-01 DIAGNOSIS — R0602 Shortness of breath: Secondary | ICD-10-CM | POA: Diagnosis not present

## 2018-04-01 DIAGNOSIS — E119 Type 2 diabetes mellitus without complications: Secondary | ICD-10-CM

## 2018-04-01 DIAGNOSIS — I255 Ischemic cardiomyopathy: Secondary | ICD-10-CM

## 2018-04-01 DIAGNOSIS — J45909 Unspecified asthma, uncomplicated: Secondary | ICD-10-CM

## 2018-04-01 DIAGNOSIS — R0989 Other specified symptoms and signs involving the circulatory and respiratory systems: Secondary | ICD-10-CM

## 2018-04-01 DIAGNOSIS — E669 Obesity, unspecified: Principal | ICD-10-CM

## 2018-04-01 DIAGNOSIS — M545 Low back pain: Secondary | ICD-10-CM | POA: Diagnosis not present

## 2018-04-01 DIAGNOSIS — G8929 Other chronic pain: Secondary | ICD-10-CM

## 2018-04-01 DIAGNOSIS — I10 Essential (primary) hypertension: Secondary | ICD-10-CM

## 2018-04-01 DIAGNOSIS — E1169 Type 2 diabetes mellitus with other specified complication: Secondary | ICD-10-CM

## 2018-04-01 HISTORY — DX: Other specified symptoms and signs involving the circulatory and respiratory systems: R09.89

## 2018-04-01 MED ORDER — BENZONATATE 100 MG PO CAPS: 100.0000 mg | ORAL_CAPSULE | Freq: Three times a day (TID) | ORAL | 0 refills | Status: AC | PRN

## 2018-04-01 MED ORDER — DICLOFENAC SODIUM 1 % TD GEL: 2.0000 g | Freq: Four times a day (QID) | TRANSDERMAL | 0 refills | Status: AC

## 2018-04-01 NOTE — Progress Notes (Signed)
   CC: Cough  HPI:  [name] is a 53 y.o. female with asthma, diabetes type 2, and ICM, hypertension who presents for cough. Please see problem based charting for evaluation, assessment, and plan.  Past Medical History:  Diagnosis Date  . Acute pain of left shoulder 06/07/2017  . Asthma    as a child  . 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 Johnsie Cancel note 04/2017  . Carpal tunnel syndrome   . Diabetes mellitus type 2, uncontrolled (HCC) DX: 1999   Started as gestational diabetes  . ESSENTIAL HYPERTENSION 07/08/2006  . Gallstones   . HSV (herpes simplex virus) anogenital infection   . HYPERLIPIDEMIA 08/20/2007  . LBBB (left bundle branch block)   . Menorrhagia   . Migraine    Normal CT head (08/06/2006)  . Swelling of abdominal wall 03/02/2014   Review of Systems:    Review of Systems  Constitutional: Positive for malaise/fatigue. Negative for chills and fever.  Respiratory: Positive for cough and shortness of breath.   Cardiovascular: Negative for chest pain.  Gastrointestinal: Negative for abdominal pain, nausea and vomiting.  Musculoskeletal: Positive for back pain.  Neurological: Negative for dizziness and headaches.   Physical Exam:  Vitals:   04/01/18 1526  BP: (!) 154/69  Pulse: 91  Temp: 99 F (37.2 C)  TempSrc: Oral  SpO2: 100%  Weight: 204 lb (92.5 kg)   Physical Exam  Constitutional: She appears well-developed and well-nourished. No distress.  HENT:  Head: Normocephalic and atraumatic.  Right Ear: External ear normal.  Left Ear: External ear normal.  Mouth/Throat: Oropharynx is clear and moist. No oropharyngeal exudate.  No posterior pharyngeal erythema or exudate, no anterior posterior cervical lymphadenopathy  Eyes: Conjunctivae are normal.  Cardiovascular: Normal rate, regular rhythm and normal heart sounds.  Respiratory: Effort normal and breath sounds normal. No respiratory distress. She has no wheezes.  GI: Soft. Bowel  sounds are normal. She exhibits no distension. There is no abdominal tenderness.  Musculoskeletal:        General: Tenderness (Tenderness to palpation on lateral and poserior portion of last rib) present.     Comments: No vesicular or erythematous rash seen  Neurological: She is alert.  Skin: She is not diaphoretic.  Psychiatric: She has a normal mood and affect. Her behavior is normal. Judgment and thought content normal.   Assessment & Plan:   See Encounters Tab for problem based charting.  Patient discussed with Dr. Eppie Gibson

## 2018-04-01 NOTE — Addendum Note (Signed)
Addended by: Oval Linsey D on: 04/01/2018 07:34 PM   Modules accepted: Level of Service

## 2018-04-01 NOTE — Assessment & Plan Note (Signed)
  Patient states that she has been having a productive cough, sore throat for the past 1 week. She has accompanied runny nose and stuffy nose, right ear pain, shortness of breath requiring use of albuterol inhaler.  Denies post nasal drip, fever, myalgias.   She has used mucinex, theraful, halls, and chlorseptic spray for her symptoms.   Assessment and plan  Patient has not had the influenza vaccine this year. She out of the window for influenza treatment.  We will treat patient for upper respiratory viral illness.  -Prescribed Tessalon Perles -Encourage use of albuterol every 4 hours as needed -Encouraged rest and plenty of hydration

## 2018-04-01 NOTE — Patient Instructions (Signed)
It was a pleasure to see you today Sandra [name]. I am sorry that you are not feeling well. I would recommend the following:   -Please continue to use albuterol inhaler on an as needed basis  -Please use tessalon pearls to suppress your cough  -Please use voltaren gel for your back pain   If you have any questions or concerns, please call our clinic at [phone] between 9am-5pm and after hours call [phone] and ask for the internal medicine resident on call. If you feel you are having a medical emergency please call 911.   Thank you, we look forward to help you remain healthy!  Lars Mage, MD Internal Medicine PGY2

## 2018-04-01 NOTE — Assessment & Plan Note (Signed)
Patient states that she has been having right-sided back pain few days prior to her upper respiratory symptoms starting.  She describes the pain as present over posterior and lateral aspect of right last rib, dull in nature, constantly pressure, worsened with movement.   Assessment and plan Patient's back pain seems musculoskeletal in etiology.  Will start with conservative treatment.  -Prescribed Voltaren gel

## 2018-04-01 NOTE — Progress Notes (Signed)
Patient ID: Sandra [name], female   DOB: 04/16/1965, 53 y.o.   MRN: [id number]  Case discussed with Dr. Maricela Bo at the time of the visit.  We reviewed the resident's history and exam and pertinent patient test results.  I agree with the assessment, diagnosis and plan of care documented in the resident's note.

## 2018-04-02 NOTE — Telephone Encounter (Signed)
Next appt scheduled  4/2 with PCP. 

## 2018-04-07 ENCOUNTER — Telehealth: Payer: Self-pay | Admitting: Internal Medicine

## 2018-04-07 NOTE — Telephone Encounter (Signed)
Pt contact [phone] , pls would like to know is she cleared to return to work

## 2018-04-07 NOTE — Telephone Encounter (Signed)
Called patient back and recommended that the patient not return to work just yet as she is continuing to have a dry cough, headaches, and runny nose although she does not have a fever.

## 2018-04-09 ENCOUNTER — Telehealth: Payer: Self-pay | Admitting: Internal Medicine

## 2018-04-09 NOTE — Telephone Encounter (Signed)
Pt requesting nurse to callback, pt is wanted some more cough medicine, what the physician prescribe isn't working; pt contact [phone]

## 2018-04-10 ENCOUNTER — Other Ambulatory Visit: Payer: Self-pay | Admitting: Internal Medicine

## 2018-04-10 MED ORDER — ACETAMINOPHEN-CODEINE #3 300-30 MG PO TABS: 1.0000 | ORAL_TABLET | Freq: Four times a day (QID) | ORAL | 0 refills | Status: AC | PRN

## 2018-04-10 NOTE — Progress Notes (Signed)
I called Sandra [name] back and she informed me that she is continuing to have a dry cough that is particularly worsened at nighttime and not alleviated with tessalon pearls.   -prescribed tylenol #3 q6hrs for 5 days -Instructed her to get plenty of rest and that we can provide a work note for her missed days if need be that she can pick up at a later date   Lars Mage, MD Internal Medicine PGY2 [phone] 04/10/2018, 9:32 AM

## 2018-04-10 NOTE — Telephone Encounter (Signed)
Patient was called and tylenol with codeine was provided for cough

## 2018-04-22 ENCOUNTER — Other Ambulatory Visit: Payer: Self-pay | Admitting: Internal Medicine

## 2018-04-22 DIAGNOSIS — E669 Obesity, unspecified: Principal | ICD-10-CM

## 2018-04-22 DIAGNOSIS — E1169 Type 2 diabetes mellitus with other specified complication: Secondary | ICD-10-CM

## 2018-04-22 NOTE — Telephone Encounter (Signed)
Refill approved.

## 2018-04-24 ENCOUNTER — Ambulatory Visit (INDEPENDENT_AMBULATORY_CARE_PROVIDER_SITE_OTHER): Payer: BLUE CROSS/BLUE SHIELD | Admitting: Internal Medicine

## 2018-04-24 ENCOUNTER — Other Ambulatory Visit: Payer: Self-pay

## 2018-04-24 ENCOUNTER — Encounter: Payer: Self-pay | Admitting: Internal Medicine

## 2018-04-24 DIAGNOSIS — J069 Acute upper respiratory infection, unspecified: Secondary | ICD-10-CM

## 2018-04-24 DIAGNOSIS — E669 Obesity, unspecified: Secondary | ICD-10-CM

## 2018-04-24 DIAGNOSIS — E1169 Type 2 diabetes mellitus with other specified complication: Secondary | ICD-10-CM | POA: Diagnosis not present

## 2018-04-24 DIAGNOSIS — E785 Hyperlipidemia, unspecified: Secondary | ICD-10-CM | POA: Diagnosis not present

## 2018-04-24 DIAGNOSIS — R0989 Other specified symptoms and signs involving the circulatory and respiratory systems: Secondary | ICD-10-CM

## 2018-04-24 DIAGNOSIS — I10 Essential (primary) hypertension: Secondary | ICD-10-CM

## 2018-04-24 DIAGNOSIS — B9789 Other viral agents as the cause of diseases classified elsewhere: Secondary | ICD-10-CM

## 2018-04-24 NOTE — Assessment & Plan Note (Signed)
Patient continues to experience URI symptoms with residual cough and some shortness of breath following her coughing spells. She is not currently SOB when not coughing. She still has her albuterol and cough medication from her visit on 3/10 for the same symptoms. She has not had fevers recently. Her symptoms had been improving, but she feels they have gotten a little worse in the past week. Her mother and husband are now experiencing URI symptoms and it is possible that she has been re-exposed. Given her symptoms are worsening and are consistent with those of COVID infection she was advised to quarantine at home (as well as her family members) for an additional 14 days. She request a note for work, which will be printed for her. She also endorses some diarrhea, but is still tolerating food an liquids well. - Continue PRN cough/cold medications - Continue PRN Albuterol - Quarantine at home, will provide note for employer - Contact clinic for high fevers or significant persistnent dyspnea

## 2018-04-24 NOTE — Progress Notes (Addendum)
  This is a telephone encounter between Sandra [name] and Sandra [name] on 04/24/2018. The visit was conducted with the patient located at home and Sandra [name] at [facility]. The patient's identity was confirmed using their DOB and current address. The patient has consented to being evaluated through a telephone encounter and understands the associated risks (an examination cannot be done and the patient may need to come in for an appointment) / benefits (allows the patient to remain at home, decreasing exposure to coronavirus). I personally spent 22 minutes on medical discussion.    CC: URI w/ Cough, HTN, Diabetes, HLD  HPI:  [name] is a 53 y.o. F with PMHx listed below presenting for HTN, Diabetes, HLD. Please see the A&P for the status of the patient's chronic medical problems.  Patient continues to experience URI symptoms with residual cough and some shortness of breath following her coughing spells. She is not currently SOB when not coughing. She still has her albuterol and cough medication from her visit on 3/10 for the same symptoms. She has not had fevers recently. Her symptoms had been improving, but she feels they have gotten a little worse in the past week. Her mother and husband are now experiencing URI symptoms and it is possible that she has been re-exposed. Given her symptoms are worsening and are consistent with those of COVID infection she was advised to quarantine at home (as well as her family members) for an additional 14 days. She request a note for work, which will be printed for her. She also endorses some diarrhea, but is still tolerating food an liquids well.  Past Medical History:  Diagnosis Date  . Acute pain of left shoulder 06/07/2017  . Asthma    as a child  . 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 Johnsie Cancel note 04/2017  . Carpal tunnel syndrome   . Diabetes mellitus type 2, uncontrolled (HCC) DX: 1999   Started as gestational diabetes  .  ESSENTIAL HYPERTENSION 07/08/2006  . Gallstones   . HSV (herpes simplex virus) anogenital infection   . HYPERLIPIDEMIA 08/20/2007  . LBBB (left bundle branch block)   . Menorrhagia   . Migraine    Normal CT head (08/06/2006)  . Swelling of abdominal wall 03/02/2014   Review of Systems: Performed and all others negative.  Physical Exam:  There were no vitals filed for this visit. Not performed for tele-health encounter  Assessment & Plan:   See Encounters Tab for problem based charting.  Patient discussed with Dr. Beryle Beams

## 2018-04-24 NOTE — Assessment & Plan Note (Addendum)
Last A1c 7.1 about 4 months ago. She does check her blood sugar at home and it has been 100s-200 during the day with lows into the 50s noted at night.  On 11/22 Basaglar reduced to 8U Daily due to low blood sugars. With recurrent lows, will further reduce Basaglar to 6U qhs  We continued to discuss medication adjustments. 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 She is becoming more open to changes at this time, but is not interested in further injectables. She tried Invokana previously and did not tolerate it.   She may be open to trying Jardiance in the future, especially with the reduced risk for low blood sugar (compared with glipizide) and ability to combine this and metformin into a single pill.  Will continue current regimen for now and discuss further at next in person visit. -Metformin1000mg  BID -Januvia100mg  Daily - Glipizide10mg  BID -Basaglar6 unitsqhs -ContinueBlood sugar log -ContinueLifestyle modifcations

## 2018-04-24 NOTE — Assessment & Plan Note (Addendum)
Patient does not check BP at home.  Her last BP in office was 129/70. We will continue current regimen and encouraged her to continue lifestyle modifications and weight loss. - Quinapril-HCTZ 20-12.5mg  Daily - Propranolol 20mg  BID - Continue lifestyle modifications
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
## 2018-04-24 NOTE — Patient Instructions (Signed)
Instruction provided over the phone for tele-health Visit.

## 2018-04-24 NOTE — Progress Notes (Signed)
Medicine attending: Medical history, presenting problems,  and medications, reviewed with resident physician Dr Pearson Grippe on the day of the patient telephone consultation and I concur with his evaluation and management plan. Acute on chronic cough. No fever. Mother & husband w URI sxs. Rx symptomatically. Call for progressive signs/sxs.

## 2018-04-24 NOTE — Assessment & Plan Note (Signed)
Atorvastatin dose increased to 40mg  Daily at last visit. She continues to tolerate this well. Denies myalgias or other new symptoms. - Continue Atorvastin 40mg  Daily

## 2018-04-30 ENCOUNTER — Other Ambulatory Visit: Payer: Self-pay | Admitting: Internal Medicine

## 2018-04-30 DIAGNOSIS — G43009 Migraine without aura, not intractable, without status migrainosus: Secondary | ICD-10-CM

## 2018-04-30 NOTE — Telephone Encounter (Signed)
Refill approved.

## 2018-05-08 ENCOUNTER — Telehealth: Payer: Self-pay

## 2018-05-08 NOTE — Telephone Encounter (Signed)
Virtual Visit Pre-Appointment Phone Call  Steps For Call:  1. Confirm consent - "In the setting of the current Covid19 crisis, you are scheduled for a (phone or video) visit with your provider on (date) at (time).  Just as we do with many in-office visits, in order for you to participate in this visit, we must obtain consent.  If you'd like, I can send this to your mychart (if signed up) or email for you to review.  Otherwise, I can obtain your verbal consent now.  All virtual visits are billed to your insurance company just like a normal visit would be.  By agreeing to a virtual visit, we'd like you to understand that the technology does not allow for your provider to perform an examination, and thus may limit your provider's ability to fully assess your condition.  Finally, though the technology is pretty good, we cannot assure that it will always work on either your or our end, and in the setting of a video visit, we may have to convert it to a phone-only visit.  In either situation, we cannot ensure that we have a secure connection.  Are you willing to proceed?" STAFF: Did the patient verbally acknowledge consent to telehealth visit? Yes  2. Confirm the BEST phone number to call the day of the visit by including in appointment notes  3. Give patient instructions for WebEx/MyChart download to smartphone as below or Doximity/Doxy.me if video visit (depending on what platform provider is using)  4. Advise patient to be prepared with their blood pressure, heart rate, weight, any heart rhythm information, their current medicines, and a piece of paper and pen handy for any instructions they may receive the day of their visit  5. Inform patient they will receive a phone call 15 minutes prior to their appointment time (may be from unknown caller ID) so they should be prepared to answer  Confirm that appointment type is correct in Epic appointment notes [place] has been deemed a candidate for a follow-up tele-health visit to limit community exposure during the Covid-19 pandemic. I spoke with the patient via phone to ensure availability of phone/video source, confirm preferred email & phone number, and discuss instructions and expectations.  I reminded Sandra [name] to be prepared with any vital sign and/or heart rhythm information that could potentially be obtained via home monitoring, at the time of her visit. I reminded Sandra [name] to expect a phone call at the time of her visit if her visit.  Sandra Barter, RN 05/08/2018 4:30 PM   INSTRUCTIONS FOR DOWNLOADING THE WEBEX APP TO SMARTPHONE  - If Apple, ask patient to go to App Store and type in WebEx in the search bar. [employer], the blue/green circle. If Android, go to [employer] and type in [employer] in the search bar. The app is free but as with any other app downloads, their phone may require them to verify saved payment information or Apple/Android password.  - The patient does NOT have to create an account. - On the day of the visit, the assist will walk the patient through joining the meeting with the meeting number/password.  INSTRUCTIONS FOR DOWNLOADING THE MYCHART APP TO SMARTPHONE  - The patient must first make sure to have activated MyChart and know their login information - If Apple, go to [employer] and type in MyChart in the search bar  and download the app. If Android, ask patient to go to [employer] and type in [place] in the search bar and download the app. The app is free but as with any other app downloads, their phone may require them to verify saved payment information or Apple/Android password.  - The patient will need to then log into the app with their MyChart username and password, and select [facility] as their healthcare provider to link the account. When it is time for your visit, go to the MyChart app, find appointments, and  click Begin Video Visit. Be sure to Select Allow for your device to access the Microphone and Camera for your visit. You will then be connected, and your provider will be with you shortly.  **If they have any issues connecting, or need assistance please contact MyChart service desk [phone])**  **If using a computer, in order to ensure the best quality for their visit they will need to use either of the following Internet Browsers: [employer], or Google Chrome**  IF USING DOXIMITY or DOXY.ME - The patient will receive a link just prior to their visit, either by text or email (to be determined day of appointment depending on if it's doxy.me or Doximity).     FULL LENGTH CONSENT FOR TELE-HEALTH VISIT   I hereby voluntarily request, consent and authorize [facility] and its employed or contracted physicians, physician assistants, nurse practitioners or other licensed health care professionals (the Practitioner), to provide me with telemedicine health care services (the "Services") as deemed necessary by the treating Practitioner. I acknowledge and consent to receive the Services by the Practitioner via telemedicine. I understand that the telemedicine visit will involve communicating with the Practitioner through live audiovisual communication technology and the disclosure of certain medical information by electronic transmission. I acknowledge that I have been given the opportunity to request an in-person assessment or other available alternative prior to the telemedicine visit and am voluntarily participating in the telemedicine visit.  I understand that I have the right to withhold or withdraw my consent to the use of telemedicine in the course of my care at any time, without affecting my right to future care or treatment, and that the Practitioner or I may terminate the telemedicine visit at any time. I understand that I have the right to inspect all information obtained and/or  recorded in the course of the telemedicine visit and may receive copies of available information for a reasonable fee.  I understand that some of the potential risks of receiving the Services via telemedicine include:  Marland Kitchen Delay or interruption in medical evaluation due to technological equipment failure or disruption; . Information transmitted may not be sufficient (e.g. poor resolution of images) to allow for appropriate medical decision making by the Practitioner; and/or  . In rare instances, security protocols could fail, causing a breach of personal health information.  Furthermore, I acknowledge that it is my responsibility to provide information about my medical history, conditions and care that is complete and accurate to the best of my ability. I acknowledge that Practitioner's advice, recommendations, and/or decision may be based on factors not within their control, such as incomplete or inaccurate data provided by me or distortions of diagnostic images or specimens that may result from electronic transmissions. I understand that the practice of medicine is not an exact science and that Practitioner makes no warranties or guarantees regarding treatment outcomes. I acknowledge that I will receive a copy of this consent concurrently upon  execution via email to the email address I last provided but may also request a printed copy by calling the office of [facility].    I understand that my insurance will be billed for this visit.   I have read or had this consent read to me. . I understand the contents of this consent, which adequately explains the benefits and risks of the Services being provided via telemedicine.  . I have been provided ample opportunity to ask questions regarding this consent and the Services and have had my questions answered to my satisfaction. . I give my informed consent for the services to be provided through the use of telemedicine in my medical care  By participating  in this telemedicine visit I agree to the above.

## 2018-05-09 NOTE — Progress Notes (Signed)
Virtual Visit via Video Note   This visit type was conducted due to national recommendations for restrictions regarding the COVID-19 Pandemic (e.g. social distancing) in an effort to limit this patient's exposure and mitigate transmission in our community.  Due to her co-morbid illnesses, this patient is at least at moderate risk for complications without adequate follow up.  This format is felt to be most appropriate for this patient at this time.  All issues noted in this document were discussed and addressed.  A limited physical exam was performed with this format.  Please refer to the patient's chart for her consent to telehealth for [facility].   Evaluation Performed:  Follow-up visit  Date:  05/12/2018   ID:  Sandra [name], Sandra [name] [birth year], MRN [id number]  Patient Location: Home Provider Location: Office  PCP:  [name], MD  Cardiologist:  Johnsie Cancel Electrophysiologist:  None   Chief Complaint:   Dyspnea  History of Present Illness:    Sandra [name] is a 53 y.o. female with DM, HLD, HTN first seen 04/26/17 for dyspnea. 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 Had low grade fever 2 weeks ago with URI but did not get COVID testing     Past Medical History:  Diagnosis Date  . Acute pain of left shoulder 06/07/2017  . Asthma    as a child  . 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 Johnsie Cancel note 04/2017  . Carpal tunnel syndrome   . Diabetes mellitus type 2, uncontrolled (HCC) DX: 1999   Started as gestational diabetes  . ESSENTIAL HYPERTENSION 07/08/2006  . Gallstones   . HSV (herpes  simplex virus) anogenital infection   . HYPERLIPIDEMIA 08/20/2007  . LBBB (left bundle branch block)   . Menorrhagia   . Migraine    Normal CT head (08/06/2006)  . Swelling of abdominal wall 03/02/2014   Past Surgical History:  Procedure Laterality Date  . CHOLECYSTECTOMY  2009  . DILATATION & CURETTAGE/HYSTEROSCOPY WITH MYOSURE N/A 12/25/2017   Procedure: DILATATION & CURETTAGE/HYSTEROSCOPY   POLYPECTOMY WITH MYOSURE;  Surgeon: Charyl Bigger, MD;  Location: [facility];  Service: Gynecology;  Laterality: N/A;  . TUBAL LIGATION       Current Meds  Medication Sig  . acetaminophen (TYLENOL) 500 MG tablet Take 1,000 mg by mouth every 6 (six) hours as needed for moderate pain or headache.  . albuterol (PROVENTIL HFA;VENTOLIN HFA) 108 (90 Base) MCG/ACT inhaler Inhale 1-2 puffs into the lungs every 6 (six) hours as needed for wheezing or shortness of breath.  . Alpha-Lipoic Acid 300 MG TABS Take 300 mg by mouth 2 (two) times daily.  Marland Kitchen atorvastatin (LIPITOR) 40 MG tablet Take 1 tablet (40 mg total) by mouth daily.  Marland Kitchen BIOTIN PO Take 500 mg by mouth daily.  Marland Kitchen CINNAMON PO Take 1,000 mg by mouth 2 (two) times daily.  Marland Kitchen co-enzyme Q-10 30 MG capsule Take 30 mg by mouth daily.  Marland Kitchen dextromethorphan-guaiFENesin (MUCINEX DM) 30-600 MG 12hr tablet Take 1 tablet 2 (two) times daily by mouth.  . eletriptan (RELPAX) 20 MG tablet TAKE  1 TABLET BY MOUTH AS NEEDED FOR  MIGRAINE  OR  HEADACHE.  MAY  REPEAT  IN  2  HOURS  IF  HEADACHE  PERSISTS  OR  RECURS  . fluticasone (FLONASE) 50 MCG/ACT nasal spray Place into both nostrils as directed.  Marland Kitchen glipiZIDE (GLUCOTROL) 10 MG tablet TAKE 1 TABLET BY MOUTH TWICE DAILY BEFORE MEAL(S)  . guaiFENesin-codeine (ROBITUSSIN AC) 100-10 MG/5ML syrup Take 10 mLs by mouth at bedtime as needed for cough.  Marland Kitchen HYDROcodone-acetaminophen (NORCO/VICODIN) 5-325 MG tablet Take 1 tablet by mouth every 6 (six) hours as needed for severe pain.  Marland Kitchen ibuprofen (ADVIL,MOTRIN) 200 MG tablet Take 200  mg by mouth every 6 (six) hours as needed for mild pain.  Marland Kitchen ibuprofen (ADVIL,MOTRIN) 600 MG tablet Take 1 tablet (600 mg total) by mouth every 6 (six) hours as needed.  Marland Kitchen ibuprofen (ADVIL,MOTRIN) 800 MG tablet Take 1 tablet (800 mg total) by mouth every 8 (eight) hours as needed.  . Insulin Glargine (BASAGLAR KWIKPEN) 100 UNIT/ML SOPN Inject 0.08 mLs (8 Units total) into the skin at bedtime.  . Insulin Pen Needle (B-D UF III MINI PEN NEEDLES) 31G X 5 MM MISC 1 Stick by Does not apply route daily. 11.69, insulin requirement  . magnesium oxide (MAG-OX) 400 MG tablet Take 400 mg by mouth daily.  . metFORMIN (GLUCOPHAGE-XR) 500 MG 24 hr tablet Take 2 tablets by mouth twice daily  . Naltrexone-buPROPion HCl ER (CONTRAVE) 8-90 MG TB12 Take 1 tablet by mouth 2 (two) times daily.  . promethazine (PHENERGAN) 12.5 MG tablet Take 1 tablet (12.5 mg total) by mouth every 6 (six) hours as needed for nausea.  . propranolol (INDERAL) 20 MG tablet TAKE 1 TABLET BY MOUTH TWICE DAILY  . quinapril-hydrochlorothiazide (ACCURETIC) 20-12.5 MG tablet TAKE 1 TABLET BY MOUTH ONCE DAILY  . sitaGLIPtin (JANUVIA) 100 MG tablet Take 1 tablet (100 mg total) by mouth daily.  . vitamin B-12 (CYANOCOBALAMIN) 1000 MCG tablet Take 1,000 mcg by mouth daily.     Allergies:   Patient has no known allergies.   Social History   Tobacco Use  . Smoking status: Never Smoker  . Smokeless tobacco: Never Used  Substance Use Topics  . Alcohol use: Yes    Alcohol/week: 0.0 standard drinks    Comment: occ  . Drug use: No     Family Hx: The patient's family history includes Diabetes in her father; Hypertension in her father.  ROS:   Please see the history of present illness.     All other systems reviewed and are negative.   Prior CV studies:   The following studies were reviewed today:  CXR 12/12/17 TTE 05/09/17 Myovue 05/20/17  Labs/Other Tests and Data Reviewed:    EKG:   SR IVCD rate 96 03/19/17  Recent Labs:  12/18/2017: BUN 9; Creatinine, Ser 0.78; Hemoglobin 11.9; Platelets 345; Potassium 3.8; Sodium 135   Recent Lipid Panel Lab Results  Component Value Date/Time   CHOL 138 06/06/2017 03:13 PM   TRIG 90 06/06/2017 03:13 PM   HDL 31 (L) 06/06/2017 03:13 PM   CHOLHDL 4.5 (H) 06/06/2017 03:13 PM   CHOLHDL 3.9 05/29/2012 03:12 PM   LDLCALC 89 06/06/2017 03:13 PM    Wt Readings from Last 3 Encounters:  05/12/18 88.9 kg  04/01/18 92.5 kg  01/07/18 90.4 kg     Objective:    Vital Signs:  Ht 5' (1.524 m)   Wt 88.9 kg   BMI 38.28 kg/m  No distress Hair bonnet on No tachypnea No JVP elevation No edema  ASSESSMENT & PLAN:    1. DCM:  Stable on medical Rx functional class one Update echo when COVID 19 restrictions lifted If EF deteriorates consider changing to [facility] although may be cost prohibitive to patient 2. HLD continue statin labs with primary 3. DM:  Discussed low carb diet.  Target hemoglobin A1c is 6.5 or less.  Continue current medications.  COVID-19 Education: The signs and symptoms of COVID-19 were discussed with the patient and how to seek care for testing (follow up with PCP or arrange E-visit).  The importance of social distancing was discussed today.  Time:   Today, I have spent 30 minutes with the patient with telehealth technology discussing the above problems.     Medication Adjustments/Labs and Tests Ordered: Current medicines are reviewed at length with the patient today.  Concerns regarding medicines are outlined above.   Tests Ordered:  Echo for DCM ? June  Medication Changes: No orders of the defined types were placed in this encounter.   Disposition:  Follow up in a year  Signed, Jenkins Rouge, MD  05/12/2018 9:43 AM    [place]

## 2018-05-12 ENCOUNTER — Other Ambulatory Visit: Payer: Self-pay

## 2018-05-12 ENCOUNTER — Encounter: Payer: Self-pay | Admitting: Cardiovascular Disease

## 2018-05-12 ENCOUNTER — Ambulatory Visit: Payer: BLUE CROSS/BLUE SHIELD | Admitting: Cardiovascular Disease

## 2018-05-12 ENCOUNTER — Telehealth (INDEPENDENT_AMBULATORY_CARE_PROVIDER_SITE_OTHER): Payer: BLUE CROSS/BLUE SHIELD | Admitting: Cardiovascular Disease

## 2018-05-12 VITALS — Ht 60.0 in | Wt 196.0 lb

## 2018-05-12 DIAGNOSIS — I420 Dilated cardiomyopathy: Secondary | ICD-10-CM | POA: Diagnosis not present

## 2018-05-12 NOTE — Patient Instructions (Signed)
Medication Instructions:   If you need a refill on your cardiac medications before your next appointment, please call your pharmacy.   Lab work:  If you have labs (blood work) drawn today and your tests are completely normal, you will receive your results only by: Marland Kitchen MyChart Message (if you have MyChart) OR . A paper copy in the mail If you have any lab test that is abnormal or we need to change your treatment, we will call you to review the results.  Testing/Procedures: None ordered at this time.  Follow-Up: At [facility], you and your health needs are our priority.  As part of our continuing mission to provide you with exceptional heart care, we have created designated Provider Care Teams.  These Care Teams include your primary Cardiologist (physician) and Advanced Practice Providers (APPs -  Physician Assistants and Nurse Practitioners) who all work together to provide you with the care you need, when you need it. You will need a follow up appointment in 6 months.  Please call our office 2 months in advance to schedule this appointment.  You may see Dr. Johnsie Cancel or one of the following Advanced Practice Providers on your designated Care Team:   Truitt Merle, NP Cecilie Kicks, NP . Kathyrn Drown, NP

## 2018-06-05 LAB — HM DIABETES EYE EXAM

## 2018-06-23 ENCOUNTER — Other Ambulatory Visit: Payer: Self-pay | Admitting: Internal Medicine

## 2018-06-23 DIAGNOSIS — E1169 Type 2 diabetes mellitus with other specified complication: Secondary | ICD-10-CM

## 2018-06-23 NOTE — Telephone Encounter (Signed)
Refill approcved

## 2018-06-27 ENCOUNTER — Telehealth: Payer: Self-pay

## 2018-06-27 DIAGNOSIS — E669 Obesity, unspecified: Secondary | ICD-10-CM

## 2018-06-27 DIAGNOSIS — E1169 Type 2 diabetes mellitus with other specified complication: Secondary | ICD-10-CM

## 2018-06-27 MED ORDER — SITAGLIPTIN PHOS-METFORMIN HCL 50-1000 MG PO TABS: 1.0000 | ORAL_TABLET | Freq: Two times a day (BID) | ORAL | 11 refills | Status: DC

## 2018-06-27 MED ORDER — EMPAGLIFLOZIN 10 MG PO TABS: 10.0000 mg | ORAL_TABLET | Freq: Every day | ORAL | 3 refills | Status: DC

## 2018-06-27 NOTE — Telephone Encounter (Signed)
Thank you!  I think this will be great for her!  Sandra [name]

## 2018-06-27 NOTE — Telephone Encounter (Signed)
Patient is ok with switch from glipizide to [place]. Was able to find a copay card to bring her copay down to $0 for Jardiance. Also combined her Januvia and metformin into 1 pill (Januvia) with copay card for $5 copay. She will start Janumet after finishing her current Januvia. Educated patient w/ teach back on how to take her new medications.   Will follow up with patient in 2-4 weeks for labs (BMP, A1c).

## 2018-07-04 ENCOUNTER — Telehealth: Payer: Self-pay

## 2018-07-04 DIAGNOSIS — E1169 Type 2 diabetes mellitus with other specified complication: Secondary | ICD-10-CM

## 2018-07-04 DIAGNOSIS — E669 Obesity, unspecified: Secondary | ICD-10-CM

## 2018-07-06 ENCOUNTER — Other Ambulatory Visit: Payer: Self-pay | Admitting: Internal Medicine

## 2018-07-07 NOTE — Telephone Encounter (Signed)
Patient is tolerating Jardiance well and does not report any side effects. Patient has discontinued glipizide. She has not started Janumet yet because she plans to finish her Januvia before starting. She knows to d/c Januvia and metformin once she starts taking Janumet.  Scheduled lab follow up for 07/17/18 at 3PM for BMP and A1c.

## 2018-07-07 NOTE — Telephone Encounter (Signed)
Refill approved.

## 2018-07-07 NOTE — Telephone Encounter (Signed)
Glad this is working for her. Thank you

## 2018-07-11 ENCOUNTER — Encounter (HOSPITAL_COMMUNITY): Payer: Self-pay | Admitting: Emergency Medicine

## 2018-07-11 ENCOUNTER — Observation Stay (HOSPITAL_COMMUNITY)
Admission: EM | Admit: 2018-07-11 | Discharge: 2018-07-11 | Disposition: A | Discharge status: Home / Self Care | Payer: BC Managed Care – PPO | Attending: Student in an Organized Health Care Education/Training Program | Admitting: Student in an Organized Health Care Education/Training Program

## 2018-07-11 ENCOUNTER — Emergency Department (HOSPITAL_COMMUNITY): Payer: BC Managed Care – PPO

## 2018-07-11 ENCOUNTER — Other Ambulatory Visit: Payer: Self-pay

## 2018-07-11 DIAGNOSIS — Z1159 Encounter for screening for other viral diseases: Secondary | ICD-10-CM | POA: Insufficient documentation

## 2018-07-11 DIAGNOSIS — I509 Heart failure, unspecified: Secondary | ICD-10-CM | POA: Insufficient documentation

## 2018-07-11 DIAGNOSIS — Z8249 Family history of ischemic heart disease and other diseases of the circulatory system: Secondary | ICD-10-CM | POA: Diagnosis not present

## 2018-07-11 DIAGNOSIS — K219 Gastro-esophageal reflux disease without esophagitis: Secondary | ICD-10-CM | POA: Diagnosis not present

## 2018-07-11 DIAGNOSIS — G5600 Carpal tunnel syndrome, unspecified upper limb: Secondary | ICD-10-CM | POA: Diagnosis not present

## 2018-07-11 DIAGNOSIS — E1169 Type 2 diabetes mellitus with other specified complication: Secondary | ICD-10-CM | POA: Diagnosis present

## 2018-07-11 DIAGNOSIS — J45909 Unspecified asthma, uncomplicated: Secondary | ICD-10-CM | POA: Diagnosis not present

## 2018-07-11 DIAGNOSIS — I454 Nonspecific intraventricular block: Secondary | ICD-10-CM | POA: Diagnosis not present

## 2018-07-11 DIAGNOSIS — Z791 Long term (current) use of non-steroidal anti-inflammatories (NSAID): Secondary | ICD-10-CM | POA: Diagnosis not present

## 2018-07-11 DIAGNOSIS — Z6837 Body mass index (BMI) 37.0-37.9, adult: Secondary | ICD-10-CM | POA: Insufficient documentation

## 2018-07-11 DIAGNOSIS — R079 Chest pain, unspecified: Secondary | ICD-10-CM

## 2018-07-11 DIAGNOSIS — I428 Other cardiomyopathies: Secondary | ICD-10-CM | POA: Diagnosis not present

## 2018-07-11 DIAGNOSIS — A609 Anogenital herpesviral infection, unspecified: Secondary | ICD-10-CM | POA: Insufficient documentation

## 2018-07-11 DIAGNOSIS — Z79899 Other long term (current) drug therapy: Secondary | ICD-10-CM | POA: Insufficient documentation

## 2018-07-11 DIAGNOSIS — E119 Type 2 diabetes mellitus without complications: Secondary | ICD-10-CM | POA: Diagnosis not present

## 2018-07-11 DIAGNOSIS — Z7951 Long term (current) use of inhaled steroids: Secondary | ICD-10-CM | POA: Insufficient documentation

## 2018-07-11 DIAGNOSIS — Z794 Long term (current) use of insulin: Secondary | ICD-10-CM | POA: Diagnosis not present

## 2018-07-11 DIAGNOSIS — I10 Essential (primary) hypertension: Secondary | ICD-10-CM | POA: Diagnosis present

## 2018-07-11 DIAGNOSIS — I110 Hypertensive heart disease with heart failure: Secondary | ICD-10-CM | POA: Insufficient documentation

## 2018-07-11 DIAGNOSIS — R0789 Other chest pain: Secondary | ICD-10-CM | POA: Diagnosis not present

## 2018-07-11 DIAGNOSIS — I420 Dilated cardiomyopathy: Secondary | ICD-10-CM | POA: Diagnosis present

## 2018-07-11 DIAGNOSIS — I447 Left bundle-branch block, unspecified: Secondary | ICD-10-CM | POA: Insufficient documentation

## 2018-07-11 DIAGNOSIS — I5022 Chronic systolic (congestive) heart failure: Secondary | ICD-10-CM | POA: Diagnosis not present

## 2018-07-11 DIAGNOSIS — I2510 Atherosclerotic heart disease of native coronary artery without angina pectoris: Secondary | ICD-10-CM | POA: Diagnosis not present

## 2018-07-11 DIAGNOSIS — M25512 Pain in left shoulder: Secondary | ICD-10-CM | POA: Insufficient documentation

## 2018-07-11 DIAGNOSIS — G43909 Migraine, unspecified, not intractable, without status migrainosus: Secondary | ICD-10-CM | POA: Insufficient documentation

## 2018-07-11 DIAGNOSIS — E785 Hyperlipidemia, unspecified: Secondary | ICD-10-CM | POA: Diagnosis not present

## 2018-07-11 DIAGNOSIS — R9431 Abnormal electrocardiogram [ECG] [EKG]: Secondary | ICD-10-CM

## 2018-07-11 DIAGNOSIS — M545 Low back pain: Secondary | ICD-10-CM | POA: Diagnosis not present

## 2018-07-11 DIAGNOSIS — E669 Obesity, unspecified: Secondary | ICD-10-CM | POA: Diagnosis not present

## 2018-07-11 HISTORY — DX: Chest pain, unspecified: R07.9

## 2018-07-11 LAB — BASIC METABOLIC PANEL
Anion gap: 13 (ref 5–15)
BUN: 15 mg/dL (ref 6–20)
CO2: 22 mmol/L (ref 22–32)
Calcium: 9.3 mg/dL (ref 8.9–10.3)
Chloride: 104 mmol/L (ref 98–111)
Creatinine, Ser: 0.91 mg/dL (ref 0.44–1.00)
GFR calc Af Amer: >60 mL/min (ref >60)
GFR calc non Af Amer: >60 mL/min (ref >60)
Glucose, Bld: 181 mg/dL — ABNORMAL HIGH (ref 70–99)
Potassium: 3.8 mmol/L (ref 3.5–5.1)
Sodium: 139 mmol/L (ref 135–145)

## 2018-07-11 LAB — HEMOGLOBIN A1C
Hgb A1c MFr Bld: 8.6 % — ABNORMAL HIGH (ref 4.8–5.6)
Mean Plasma Glucose: 200.12 mg/dL

## 2018-07-11 LAB — LIPID PANEL
Cholesterol: 126 mg/dL (ref 0–200)
HDL: 32 mg/dL — ABNORMAL LOW (ref >40)
LDL Cholesterol: 65 mg/dL (ref 0–99)
Total CHOL/HDL Ratio: 3.9 RATIO
Triglycerides: 143 mg/dL (ref <150)
VLDL: 29 mg/dL (ref 0–40)

## 2018-07-11 LAB — I-STAT BETA HCG BLOOD, ED (MC, WL, AP ONLY): I-stat hCG, quantitative: <5 m[IU]/mL (ref <5)

## 2018-07-11 LAB — CBC
HCT: 33.6 % — ABNORMAL LOW (ref 36.0–46.0)
Hemoglobin: 11.1 g/dL — ABNORMAL LOW (ref 12.0–15.0)
MCH: 32.1 pg (ref 26.0–34.0)
MCHC: 33 g/dL (ref 30.0–36.0)
MCV: 97.1 fL (ref 80.0–100.0)
Platelets: 311 10*3/uL (ref 150–400)
RBC: 3.46 MIL/uL — ABNORMAL LOW (ref 3.87–5.11)
RDW: 11.9 % (ref 11.5–15.5)
WBC: 7.9 10*3/uL (ref 4.0–10.5)
nRBC: 0 % (ref 0.0–0.2)

## 2018-07-11 LAB — TROPONIN I
Troponin I: <0.03 ng/mL (ref <0.03)
Troponin I: 0.05 ng/mL (ref <0.03)

## 2018-07-11 LAB — GLUCOSE, CAPILLARY
Glucose-Capillary: 182 mg/dL — ABNORMAL HIGH (ref 70–99)
Glucose-Capillary: 175 mg/dL — ABNORMAL HIGH (ref 70–99)

## 2018-07-11 LAB — HIV ANTIBODY (ROUTINE TESTING W REFLEX): HIV Screen 4th Generation wRfx: NONREACTIVE

## 2018-07-11 MED ORDER — ATORVASTATIN CALCIUM 40 MG PO TABS: 40.0000 mg | ORAL_TABLET | Freq: Every day | ORAL | Status: DC

## 2018-07-11 MED ORDER — SODIUM CHLORIDE 0.9% FLUSH
3.0000 mL | Freq: Once | INTRAVENOUS | Status: AC
  Administered 2018-07-11: 3 mL via INTRAVENOUS

## 2018-07-11 MED ORDER — PANTOPRAZOLE SODIUM 20 MG PO TBEC
20.0000 mg | DELAYED_RELEASE_TABLET | Freq: Every day | ORAL | Status: DC
  Administered 2018-07-11: 20 mg via ORAL
  Filled 2018-07-11: qty 1

## 2018-07-11 MED ORDER — ACETAMINOPHEN 325 MG PO TABS: 650.0000 mg | ORAL_TABLET | Freq: Four times a day (QID) | ORAL | Status: DC | PRN

## 2018-07-11 MED ORDER — PROPRANOLOL HCL 20 MG PO TABS
20.0000 mg | ORAL_TABLET | Freq: Two times a day (BID) | ORAL | Status: DC
  Administered 2018-07-11: 20 mg via ORAL
  Filled 2018-07-11 (×2): qty 1

## 2018-07-11 MED ORDER — INSULIN GLARGINE 100 UNIT/ML ~~LOC~~ SOLN
6.0000 [IU] | Freq: Every day | SUBCUTANEOUS | Status: DC
  Filled 2018-07-11: qty 0.06

## 2018-07-11 MED ORDER — PANTOPRAZOLE SODIUM 20 MG PO TBEC: 20.0000 mg | DELAYED_RELEASE_TABLET | Freq: Every day | ORAL | 3 refills | Status: DC

## 2018-07-11 MED ORDER — HYDROCHLOROTHIAZIDE 12.5 MG PO CAPS
12.5000 mg | ORAL_CAPSULE | Freq: Every day | ORAL | Status: DC
  Administered 2018-07-11: 12.5 mg via ORAL
  Filled 2018-07-11: qty 1

## 2018-07-11 MED ORDER — ENOXAPARIN SODIUM 40 MG/0.4ML ~~LOC~~ SOLN: 40.0000 mg | Freq: Every day | SUBCUTANEOUS | Status: DC

## 2018-07-11 MED ORDER — INSULIN ASPART 100 UNIT/ML ~~LOC~~ SOLN
0.0000 [IU] | Freq: Three times a day (TID) | SUBCUTANEOUS | Status: DC
  Administered 2018-07-11: 3 [IU] via SUBCUTANEOUS

## 2018-07-11 MED ORDER — LISINOPRIL 20 MG PO TABS
20.0000 mg | ORAL_TABLET | Freq: Every day | ORAL | Status: DC
  Administered 2018-07-11: 20 mg via ORAL
  Filled 2018-07-11: qty 1

## 2018-07-11 MED ORDER — ACETAMINOPHEN 650 MG RE SUPP: 650.0000 mg | Freq: Four times a day (QID) | RECTAL | Status: DC | PRN

## 2018-07-11 MED ORDER — QUINAPRIL-HYDROCHLOROTHIAZIDE 20-12.5 MG PO TABS: 1.0000 | ORAL_TABLET | Freq: Every day | ORAL | Status: DC

## 2018-07-11 MED ORDER — ASPIRIN 81 MG PO CHEW
243.0000 mg | CHEWABLE_TABLET | Freq: Once | ORAL | Status: AC
  Administered 2018-07-11: 243 mg via ORAL
  Filled 2018-07-11: qty 3

## 2018-07-11 NOTE — ED Triage Notes (Signed)
Patient reports waking up with central chest pressure that radiates into the back and down left arm.  Also endorses nausea, sob, and diaphoresis.

## 2018-07-11 NOTE — Progress Notes (Signed)
Noted a weight increase over night patient denies SOB and or chest pain not distress noted. Stated she would like to do home.

## 2018-07-11 NOTE — Plan of Care (Signed)

## 2018-07-11 NOTE — Progress Notes (Addendum)
   Subjective: HD#0   Overnight: No acute events reported  Today, Sandra [name] was examined and evaluated at bedside this AM. She states she feels fine. She denies any further chest pain, palpitations, dyspnea, light-headedness, nausea, vomiting. She states she had prior hx of GERD but 'this feel different.' She mentions the chest pain lasted for quite a while and caused her to come to ED for evaluation. She also mentions after receiving aspirin in the ED, her pain resolved. She states she has reduced amount of walking due to COVID-19 and states at baseline she is able to ambulate, climb stairs, walk long distances without chest pain or dyspnea. She mentions that she would like to be discharged soon. She mentions that she sees Dr.Nishan for her heart and states she was told Dr.Nishan mentions he does not think it is cause by a blockage of the heart.  Objective:  Vital signs in last 24 hours: Vitals:   07/11/18 0300 07/11/18 0400 07/11/18 0448 07/11/18 0454  BP: 128/80 114/68 134/76   Pulse: 95 89 94   Resp: 13 18 16    Temp:   98.2 F (36.8 C)   TempSrc:   Oral   SpO2: 98% 96% 99%   Weight:    93.7 kg  Height:    5' (1.524 m)   Const: In no apparent distress, lying comfortably in bed HEENT: Atraumatic, normocephalic Resp: CTA BL, no wheezes, crackles, rhonchi, no JVP, no lower extremity edema CV: RRR, no murmurs, gallop, rub  Assessment/Plan:  Active Problems:   Chest pain  Sandra [name] is a 53 year old African-American woman with medical history of hypertension, migraines, type 2 diabetes mellitus, nonischemic cardiomyopathy, 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  #Chest pain, ruling out acute coronary syndrome #HFmrEF Presently she denies chest pain, shortness of breath, diaphoresis.  Even though her initial troponin was  unremarkable, it could have been a falsely negative given the normal course of troponin elevation after an ischemic event.  Her calculated ASCVD core is 12.9% and is definitely at an increased risk for an ischemic event.  Her repeat troponin this morning was 0.03.  We will continue to trend troponin and evaluate structural heart function with an echocardiogram.  She is already on a beta-blocker and statin for risk modification.  Her EKG does show chronic LBBB, T wave inversion in lead III and V6. -Continue trending troponin -Follow-up echocardiogram -Continue cardiac monitoring -Follow-up EKG in the a.m. -Continue Lipitor 40 mg daily -Continue propranolol 20 mg BID  #Hypertension: BP this morning 153/85 -Continue quinapril-HCTZ 20-12.5 mg daily  #Type 2 diabetes mellitus: Hemoglobin A1c on this admission was 8.6% from 7.1% in November 2019. -Continue moderate SSI -Continue Lantus 6U qhs  #Hx of GERD -Protonix 20 mg daily  #Migraines  -Continue propranolol 20 mg BID  FEN: Replace electrolytes as needed, carb modified diet VTE ppx: Subcutaneous Lovenox CODE STATUS: Full code  Dispo: Anticipated discharge in approximately 1 day(s).   Sandra Rosenthal, MD 07/11/2018, 6:40 AM Pager: [phone] Internal Medicine Teaching Service

## 2018-07-11 NOTE — ED Provider Notes (Signed)
[facility] EMERGENCY DEPARTMENT Provider Note   CSN: [id number] Arrival date & time: 07/11/18  0025    History   Chief Complaint Chief Complaint  Patient presents with   Chest Pain    HPI Sandra [name] is a 53 y.o. female with a past medical history of nonischemic dilated cardiomyopathy, HTN, HLD, LBBB, who presents today for evaluation of chest pain.  She reports that at approximately 1130 she woke up to pain in her left arm and left-sided chest.  She states that when the pain started she was feeling nauseous and short of breath.  She did not vomit.  She states that she took 81 mg of aspirin at home and her pain medicine started to ease off.  She reports compliance with all of her medications.  She denies any recent leg swelling.       HPI  Past Medical History:  Diagnosis Date   Acute pain of left shoulder 06/07/2017   Asthma    as a child   Cardiomyopathy ([place])    likely nonischemic DCM per Dr. 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   Nonischemic dilated cardiomyopathy ([place]) 03/20/2017   Right low back pain 03/02/2014   Swelling of abdominal wall 03/02/2014   Psoriasis 04/29/2013   Viral URI with cough 01/13/2013   Adenomyosis 06/03/2012   Obesity 05/16/2012   Decreased ejection fraction 02/22/2012   Perimenopausal menorrhagia 02/20/2012   LBBB (left bundle branch block) 01/24/2012   Routine adult health maintenance  09/27/2010   Diabetes mellitus type 2 in obese ([place]) 11/17/2009   Hyperlipidemia 08/20/2007   Migraine without aura 09/02/2006   Essential hypertension 07/08/2006   HSV 11/07/2005   Asthma 11/07/2005    Past Surgical History:  Procedure Laterality Date   CHOLECYSTECTOMY  2009   DILATATION & CURETTAGE/HYSTEROSCOPY WITH MYOSURE N/A 12/25/2017   Procedure: DILATATION & CURETTAGE/HYSTEROSCOPY   POLYPECTOMY WITH MYOSURE;  Surgeon: Charyl Bigger, MD;  Location: [facility];  Service: Gynecology;  Laterality: N/A;   TUBAL LIGATION       OB History    Gravida  2   Para  1   Term  1   Preterm      AB  1   Living  1     SAB      TAB  1   Ectopic      Multiple      Live Births               Home Medications    Prior to Admission medications   Medication Sig Start Date End Date Taking? Authorizing Provider  acetaminophen (TYLENOL) 500 MG tablet Take 1,000 mg by mouth every 6 (six) hours as needed for moderate pain or headache.   Yes [provider]  albuterol (PROVENTIL HFA;VENTOLIN HFA) 108 (90 Base) MCG/ACT inhaler Inhale 1-2 puffs into the lungs every 6 (six) hours as needed  for wheezing or shortness of breath. 03/21/17  Yes Ledell Noss, MD  atorvastatin (LIPITOR) 40 MG tablet Take 1 tablet (40 mg total) by mouth daily. 01/07/18  Yes [name], MD  BIOTIN PO Take 500 mg by mouth daily.   Yes [provider]  CINNAMON PO Take 1,000 mg by mouth 2 (two) times daily.   Yes [provider]  eletriptan (RELPAX) 20 MG tablet TAKE 1 TABLET BY MOUTH AS NEEDED FOR  MIGRAINE  OR  HEADACHE.  MAY  REPEAT  IN  2  HOURS  IF  HEADACHE  PERSISTS  OR  RECURS Patient taking differently: Take 20 mg by mouth every 2 (two) hours as needed for headache.  04/30/18  Yes [name], MD  empagliflozin (JARDIANCE) 10 MG TABS tablet Take 10 mg by mouth daily. 06/27/18  Yes [name], MD  fluticasone [facility]) 50 MCG/ACT nasal spray Place 1 spray  into both nostrils daily as needed for allergies.    Yes [provider]  Insulin Glargine (BASAGLAR KWIKPEN) 100 UNIT/ML SOPN Inject 0.08 mLs (8 Units total) into the skin at bedtime. Patient taking differently: Inject 6 Units into the skin at bedtime.  12/12/17  Yes Asencion Noble, MD  Insulin Pen Needle (B-D UF III MINI PEN NEEDLES) 31G X 5 MM MISC 1 Stick by Does not apply route daily. 11.69, insulin requirement 06/07/16  Yes Ophelia Shoulder, MD  promethazine (PHENERGAN) 12.5 MG tablet Take 1 tablet (12.5 mg total) by mouth every 6 (six) hours as needed for nausea. 02/24/16  Yes Lenore Cordia, MD  propranolol (INDERAL) 20 MG tablet TAKE 1 TABLET BY MOUTH TWICE DAILY Patient taking differently: Take 20 mg by mouth 2 (two) times daily.  12/03/17  Yes Lucious Groves, DO  quinapril-hydrochlorothiazide (ACCURETIC) 20-12.5 MG tablet Take 1 tablet by mouth once daily 07/07/18  Yes [name], MD  dextromethorphan-guaiFENesin [facility] DM) 30-600 MG 12hr tablet Take 1 tablet 2 (two) times daily by mouth. Patient not taking: Reported on 07/11/2018 11/30/16   Maryellen Pile, MD  guaiFENesin-codeine [facility]) 100-10 MG/5ML syrup Take 10 mLs by mouth at bedtime as needed for cough. Patient not taking: Reported on 07/11/2018 12/12/17   Asencion Noble, MD  HYDROcodone-acetaminophen (NORCO/VICODIN) 5-325 MG tablet Take 1 tablet by mouth every 6 (six) hours as needed for severe pain. Patient not taking: Reported on 07/11/2018 12/25/17   Charyl Bigger, MD  ibuprofen (ADVIL,MOTRIN) 800 MG tablet Take 1 tablet (800 mg total) by mouth every 8 (eight) hours as needed. Patient not taking: Reported on 07/11/2018 12/25/17   Charyl Bigger, MD  sitaGLIPtin-metformin (JANUMET) 50-1000 MG tablet Take 1 tablet by mouth 2 (two) times daily with a meal. Patient not taking: Reported on 07/11/2018 06/27/18   [name], MD    Family History Family History  Problem Relation Age of Onset    Hypertension Father    Diabetes Father     Social History Social History   Tobacco Use   Smoking status: Never Smoker   Smokeless tobacco: Never Used  Substance Use Topics   Alcohol use: Yes    Alcohol/week: 0.0 standard drinks    Comment: occ   Drug use: No     Allergies   Patient has no known allergies.   Review of Systems Review of Systems  Constitutional: Positive for diaphoresis. Negative for chills and fever.  HENT: Negative for congestion.   Respiratory: Positive for shortness of breath.   Cardiovascular:  Positive for chest pain. Negative for palpitations and leg swelling.  Gastrointestinal: Positive for nausea. Negative for abdominal pain and constipation.  Genitourinary: Negative for dysuria and urgency.  Musculoskeletal: Positive for back pain. Negative for neck pain.  Skin: Negative for color change and wound.  Neurological: Negative for weakness and headaches.  All other systems reviewed and are negative.    Physical Exam Updated Vital Signs BP (!) 156/90    Pulse 96    Temp 98.2 F (36.8 C) (Oral)    Resp 15    Ht 5' (1.524 m)    Wt 88 kg    SpO2 99%    BMI 37.89 kg/m   Physical Exam Vitals signs and nursing note reviewed.  Constitutional:      General: She is not in acute distress.    Appearance: She is well-developed.  HENT:     Head: Normocephalic and atraumatic.  Eyes:     Conjunctiva/sclera: Conjunctivae normal.  Neck:     Musculoskeletal: Neck supple.     Vascular: No JVD.     Trachea: No tracheal deviation.  Cardiovascular:     Rate and Rhythm: Normal rate and regular rhythm.     Pulses:          Radial pulses are 2+ on the right side and 2+ on the left side.       Dorsalis pedis pulses are 2+ on the right side and 2+ on the left side.     Heart sounds: Normal heart sounds. No murmur.  Pulmonary:     Effort: Pulmonary effort is normal. No accessory muscle usage or respiratory distress.     Breath sounds: Normal breath sounds. No  decreased breath sounds, wheezing or rhonchi.  Chest:     Chest wall: No tenderness.  Abdominal:     General: Bowel sounds are normal.     Palpations: Abdomen is soft.     Tenderness: There is no abdominal tenderness.  Musculoskeletal:     Right lower leg: She exhibits no tenderness. No edema.     Left lower leg: She exhibits no tenderness. No edema.  Skin:    General: Skin is warm and dry.  Neurological:     General: No focal deficit present.     Mental Status: She is alert.  Psychiatric:        Mood and Affect: Mood normal.      ED Treatments / Results  Labs (all labs ordered are listed, but only abnormal results are displayed) Labs Reviewed  BASIC METABOLIC PANEL - Abnormal; Notable for the following components:      Result Value   Glucose, Bld 181 (*)    All other components within normal limits  CBC - Abnormal; Notable for the following components:   RBC 3.46 (*)    Hemoglobin 11.1 (*)    HCT 33.6 (*)    All other components within normal limits  NOVEL CORONAVIRUS, NAA (HOSPITAL ORDER, SEND-OUT TO REF LAB)  TROPONIN I  I-STAT BETA HCG BLOOD, ED ([facility], [facility], [facility] ONLY)    EKG EKG Interpretation  Date/Time:  Friday July 11 2018 01:23:22 EDT Ventricular Rate:  95 PR Interval:  156 QRS Duration: 141 QT Interval:  405 QTC Calculation: 510 R Axis:   19 Text Interpretation:  Sinus rhythm Left bundle branch block Interpretation limited secondary to artifact Confirmed by Ripley Fraise [phone]) on 07/11/2018 1:46:45 AM   Radiology Dg Chest 2 View  Result Date: 07/11/2018 CLINICAL DATA:  Central chest pain EXAM: CHEST - 2 VIEW COMPARISON:  12/12/2017 FINDINGS: Heart and mediastinal contours are within normal limits. No focal opacities or effusions. No acute bony abnormality. IMPRESSION: No active cardiopulmonary disease. Electronically Signed   By: Rolm Baptise M.D.   On: 07/11/2018 01:03    Procedures Procedures (including critical care time)  Medications Ordered in  ED Medications  sodium chloride flush (NS) 0.9 % injection 3 mL (3 mLs Intravenous Given 07/11/18 0239)  aspirin chewable tablet 243 mg (243 mg Oral Given 07/11/18 0223)     Initial Impression / Assessment and Plan / ED Course  I have reviewed the triage vital signs and the nursing notes.  Pertinent labs & imaging results that were available during my care of the patient were reviewed by me and considered in my medical decision making (see chart for details).  Clinical Course as of Jul 10 625  Fri Jul 11, 2018  0312 Spoke with IMTS who will see patient for admission.     [EH]    Clinical Course User Index [EH] Lorin Glass, PA-C        Patient presents today for evaluation of chest pain and left arm pain that woke her up from her sleep.  She has a significant history of nonischemic dilated cardiomyopathy.  Given her symptoms of left arm pain, diaphoresis, shortness of breath, nausea concern for cardiac etiology of her chest pain.  She was given additional aspirin to be a total of 324 mg.  Chest x-ray obtained without evidence of acute abnormality.  Initial troponin is normal.  EG without evidence of acute ischemia.  Chart review shows that she has not had a cardiac cath, and has not had a cardiac ultrasound in over 1 year.    Patient was seen as a shared visit with Dr. Christy Gentles.  Patient will be admitted to internal medicine teaching service for chest pain rule out.   Final Clinical Impressions(s) / ED Diagnoses   Final diagnoses:  Chest pain, unspecified type  Long QT interval  Nonischemic dilated cardiomyopathy (HCC)  LBBB (left bundle branch block)    ED Discharge Orders    None       Ollen Gross 07/11/18 4562    Ripley Fraise, MD 07/11/18 [phone]

## 2018-07-11 NOTE — Discharge Summary (Signed)
Name: Sandra [name] MRN: [id number] DOB: Jul 18, 1965 53 y.o. PCP: Sandra Seat, MD  Date of Admission: 07/11/2018 12:31 AM Date of Discharge: 07/11/2018 Attending Physician: No att. providers found  Discharge Diagnosis: 1.  Chest pain, ruled out acute coronary syndrome 2.  Heart failure with mildly reduced ejection fraction 3.  Hypertension 4.  Type 2 diabetes mellitus 5.  History of GERD 6.  Migraine  Discharge Medications: Allergies as of 07/11/2018   No Known Allergies     Medication List    TAKE these medications   acetaminophen 500 MG tablet Commonly known as: TYLENOL Take 1,000 mg by mouth every 6 (six) hours as needed for moderate pain or headache.   albuterol 108 (90 Base) MCG/ACT inhaler Commonly known as: VENTOLIN HFA Inhale 1-2 puffs into the lungs every 6 (six) hours as needed for wheezing or shortness of breath.   atorvastatin 40 MG tablet Commonly known as: LIPITOR Take 1 tablet (40 mg total) by mouth daily.   Basaglar KwikPen 100 UNIT/ML Sopn Inject 0.08 mLs (8 Units total) into the skin at bedtime. What changed: how much to take   BIOTIN PO Take 500 mg by mouth daily.   CINNAMON PO Take 1,000 mg by mouth 2 (two) times daily.   dextromethorphan-guaiFENesin 30-600 MG 12hr tablet Commonly known as: MUCINEX DM Take 1 tablet 2 (two) times daily by mouth.   eletriptan 20 MG tablet Commonly known as: RELPAX TAKE 1 TABLET BY MOUTH AS NEEDED FOR  MIGRAINE  OR  HEADACHE.  MAY  REPEAT  IN  2  HOURS  IF  HEADACHE  PERSISTS  OR  RECURS What changed:   how much to take  how to take this  when to take this  reasons to take this  additional instructions   empagliflozin 10 MG Tabs tablet Commonly known as: Jardiance Take 10 mg by mouth daily.   fluticasone 50 MCG/ACT nasal spray Commonly known as: FLONASE Place 1 spray into both nostrils daily as needed for allergies.   guaiFENesin-codeine 100-10 MG/5ML syrup Commonly known as:  ROBITUSSIN AC Take 10 mLs by mouth at bedtime as needed for cough.   HYDROcodone-acetaminophen 5-325 MG tablet Commonly known as: NORCO/VICODIN Take 1 tablet by mouth every 6 (six) hours as needed for severe pain.   ibuprofen 800 MG tablet Commonly known as: ADVIL Take 1 tablet (800 mg total) by mouth every 8 (eight) hours as needed.   Insulin Pen Needle 31G X 5 MM Misc Commonly known as: B-D UF III MINI PEN NEEDLES 1 Stick by Does not apply route daily. 11.69, insulin requirement   pantoprazole 20 MG tablet Commonly known as: PROTONIX Take 1 tablet (20 mg total) by mouth daily.   promethazine 12.5 MG tablet Commonly known as: PHENERGAN Take 1 tablet (12.5 mg total) by mouth every 6 (six) hours as needed for nausea.   propranolol 20 MG tablet Commonly known as: INDERAL TAKE 1 TABLET BY MOUTH TWICE DAILY   quinapril-hydrochlorothiazide 20-12.5 MG tablet Commonly known as: ACCURETIC Take 1 tablet by mouth once daily   sitaGLIPtin-metformin 50-1000 MG tablet Commonly known as: Janumet Take 1 tablet by mouth 2 (two) times daily with a meal.       Disposition and follow-up:   Sandra [name] was discharged from [facility] in [place] condition.  At the hospital follow up visit please address:  1. Chest pain, ruled out acute coronary syndrome: Minimal flat troponin with resolution of symptoms.  2.  Labs / imaging needed at time of follow-up: Echocardiogram  3.  Pending labs/ test needing follow-up: None  Follow-up Appointments:   Hospital Course by problem list: 1.  Chest pain, ruled out acute coronary syndrome      Heart failure with mildly reduced ejection fraction  Sandra [name] is a 53 year old pleasant African-American woman with medical history significant for hypertension, migraine disorder, type 2 diabetes mellitus, nonischemic cardiomyopathy, heart failure with mildly reduced ejection fraction (EF 40-45%) and left bundle branch block.  She  presented to the hospital with left-sided chest pain and left arm sensation which lasted for several hours.  On arrival to the ED, her symptoms had resolved and was chest pain-free.  On arrival to the ED, her initial troponin was unremarkable and EKG was without any acute ischemic changes.  Given her increased likelihood for atherosclerotic disease and calculated ASCVD of 12.9%, she was kept in the hospital to monitor her troponin.  She had a minimally flat troponin increase of 0.03 with repeat of 0.05.  She was subsequently discharged home and was advised to follow-up with cardiology and obtain a repeat echocardiogram.  Her last ischemic work-up with nuclear medicine stress test showed low risk for ischemic disease.  3.  Hypertension: We will resume her home medication of quinapril-HCTZ 20-12.5 mg daily  4.  Type 2 diabetes mellitus: Was found to have a hemoglobin A1c of 8.6 from 7.1 when last checked in November 2019.  She was managed with sliding scale insulin and Lantus  5.  History of GERD: She was started on Protonix 20 mg daily  6.  Migraine: We continued home propanolol 20 mg twice daily   Discharge Vitals:   BP (!) 153/85 (BP Location: Left Arm)   Pulse 83   Temp 98.2 F (36.8 C) (Oral)   Resp 16   Ht 5' (1.524 m)   Wt 93.7 kg Comment: scale B  SpO2 98%   BMI 40.35 kg/m   Pertinent Labs, Studies, and Procedures:  CBC Latest Ref Rng & Units 07/11/2018 12/18/2017 03/18/2017  WBC 4.0 - 10.5 K/uL 7.9 7.4 8.2  Hemoglobin 12.0 - 15.0 g/dL 11.1(L) 11.9(L) 12.4  Hematocrit 36.0 - 46.0 % 33.6(L) 36.4 36.4  Platelets 150 - 400 K/uL 311 345 315   BMP Latest Ref Rng & Units 07/11/2018 12/18/2017 03/18/2017  Glucose 70 - 99 mg/dL 181(H) 115(H) 307(H)  BUN 6 - 20 mg/dL 15 9 8   Creatinine 0.44 - 1.00 mg/dL 0.91 0.78 0.73  BUN/Creat Ratio 9 - 23 - - -  Sodium 135 - 145 mmol/L 139 135 135  Potassium 3.5 - 5.1 mmol/L 3.8 3.8 4.3  Chloride 98 - 111 mmol/L 104 101 103  CO2 22 - 32 mmol/L 22 26  21(L)  Calcium 8.9 - 10.3 mg/dL 9.3 9.6 9.2   Results for Sandra [name] (MRN [id number]) as of 07/13/2018 09:15  Ref. Range 07/11/2018 04:30 07/11/2018 05:45 07/11/2018 06:52 07/11/2018 11:39 07/11/2018 11:59  Troponin I Latest Ref Range: <0.03 ng/mL   0.03 (HH) 0.05 (HH)     Discharge Instructions: Discharge Instructions    Diet - low sodium heart healthy   Complete by: As directed    Discharge instructions   Complete by: As directed    Sandra [name],   It was a pleasure taking care of you here at the hospital.  You were admitted with chest pain and we wanted to make sure this was not coming from your heart.  Your heart  enzymes remained stable and the EKG did not show any changes.  I would like for you to follow-up with your heart doctor and also your primary care doctor after your discharge.  I have also started you on a medication for GERD called Protonix.  You can take 1 pill once a day.  Take care! Dr. Eileen Stanford   Increase activity slowly   Complete by: As directed       Signed: Jean Rosenthal, MD 07/13/2018, 9:07 AM   Pager: [phone] Internal Medicine Teaching Service

## 2018-07-11 NOTE — H&P (Signed)
Date: 07/11/2018               Patient Name:  Sandra [name] MRN: [id number]  DOB: [birth year] Age / Sex: 53 y.o., female   PCP: [name], MD         Medical Service: Internal Medicine Teaching Service         Attending Physician: Dr. Evette Doffing, Mallie Mussel, *    First Contact: Dr. Avon Gully Pager: [phone]  Second Contact: Dr. Berline Lopes Pager: [phone]       After Hours (After 5p/  First Contact Pager: [phone]  weekends / holidays): Second Contact Pager: [phone]   Chief Complaint: chest pain  History of Present Illness:  Sandra [name] is a 53yo female with PMH of HTN, migraines, Type IIDM, non-ischemic cardiomyopathy, HFrEF (EF 38%), and LBBB presenting with sudden onset left sided chest and upper back pain, nausea and left arm tingling that woke her from sleep around 11:30 this morning. She states the chest pain initially felt like her usual indigestion, which she takes an "anti acid pill" for ocassionally, and was about an 8/10. She has never experienced the arm tingling before. She took an aspirin 81mg  and decided to come to the hospital when the symptoms did not completely improve. She did not vomit but has not eaten anything since last night when she had lima beans and a barbecue sandwich. Prior to this she had one of her usual migraines for which she took Ibuprofen and relpax. She does not take ibuprofen often. When she came to the ED she was given aspirin and states her chest pain, numbness and nausea have now completely resolved. She denies shortness of breath, cough, lower extremity swelling, abdominal pain, changes in urination, changes in bowel movements, recent illness, fever, focal or diffuse weakness, changes in medications or recent sick contacts except for one of her husbands coworkers who has cough and fever and is being tested for covid.    Social:  She lives at home with her husband and mother in [place] and has seven sisters  She does not smoke and  occasionally has a glass of wine.   Family History:  Mother: none Father: hypertension, diabetes, died from lung cancer    Meds:  Current Meds  Medication Sig  . acetaminophen (TYLENOL) 500 MG tablet Take 1,000 mg by mouth every 6 (six) hours as needed for moderate pain or headache.  . albuterol (PROVENTIL HFA;VENTOLIN HFA) 108 (90 Base) MCG/ACT inhaler Inhale 1-2 puffs into the lungs every 6 (six) hours as needed for wheezing or shortness of breath.  Marland Kitchen atorvastatin (LIPITOR) 40 MG tablet Take 1 tablet (40 mg total) by mouth daily.  Marland Kitchen BIOTIN PO Take 500 mg by mouth daily.  Marland Kitchen CINNAMON PO Take 1,000 mg by mouth 2 (two) times daily.  Marland Kitchen eletriptan (RELPAX) 20 MG tablet TAKE 1 TABLET BY MOUTH AS NEEDED FOR  MIGRAINE  OR  HEADACHE.  MAY  REPEAT  IN  2  HOURS  IF  HEADACHE  PERSISTS  OR  RECURS (Patient taking differently: Take 20 mg by mouth every 2 (two) hours as needed for headache. )  . empagliflozin (JARDIANCE) 10 MG TABS tablet Take 10 mg by mouth daily.  . fluticasone (FLONASE) 50 MCG/ACT nasal spray Place 1 spray into both nostrils daily as needed for allergies.   . Insulin Glargine (BASAGLAR KWIKPEN) 100 UNIT/ML SOPN Inject 0.08 mLs (8 Units total) into the skin at bedtime. (Patient taking differently: Inject 6  Units into the skin at bedtime. )  . Insulin Pen Needle (B-D UF III MINI PEN NEEDLES) 31G X 5 MM MISC 1 Stick by Does not apply route daily. 11.69, insulin requirement  . promethazine (PHENERGAN) 12.5 MG tablet Take 1 tablet (12.5 mg total) by mouth every 6 (six) hours as needed for nausea.  . propranolol (INDERAL) 20 MG tablet TAKE 1 TABLET BY MOUTH TWICE DAILY (Patient taking differently: Take 20 mg by mouth 2 (two) times daily. )  . quinapril-hydrochlorothiazide (ACCURETIC) 20-12.5 MG tablet Take 1 tablet by mouth once daily     Allergies: Allergies as of 07/11/2018  . (No Known Allergies)   Past Medical History:  Diagnosis Date  . Acute pain of left shoulder 06/07/2017  .  Asthma    as a child  . Cardiomyopathy ([place])    likely nonischemic DCM per Dr. Johnsie Cancel note 04/2017  . Carpal tunnel syndrome   . Diabetes mellitus type 2, uncontrolled (HCC) DX: 1999   Started as gestational diabetes  . ESSENTIAL HYPERTENSION 07/08/2006  . Gallstones   . HSV (herpes simplex virus) anogenital infection   . HYPERLIPIDEMIA 08/20/2007  . LBBB (left bundle branch block)   . Menorrhagia   . Migraine    Normal CT head (08/06/2006)  . Swelling of abdominal wall 03/02/2014     Review of Systems: A complete ROS was negative except as per HPI.   Physical Exam: Blood pressure 128/80, pulse 95, temperature 98.2 F (36.8 C), temperature source Oral, resp. rate 13, height 5' (1.524 m), weight 88 kg, SpO2 98 %.  Constitution: NAD, sitting up in bed, obese HEENT: [place], AT, no icterus or injection Cardio: RRR, no m/r/g, no JVD, trace edema  Respiratory: CTA, no wheezing, rales or rhonchi  Abdominal: soft, +BS, NTTP, non-distended Neuro: a&o, normal affect Skin: c/d/i   EKG: personally reviewed my interpretation is LBBB similar to previous, otherwise nsr  CXR: personally reviewed my interpretation is normal chest x-ray.   ECHO 04/2017: EF 40-45% with hypokinetic septal and inferior wall and grade 1 diastolic dysfunction.   Nuclear Stress Test 04/2017: EF 38% with mild defect in apical septal location.   Assessment & Plan by Problem: Active Problems:   Chest pain  Sandra [name] is a 53yo female with PMH of HTN, migraines, Type IIDM, non-ischemic cardiomyopathy, HFrEF (EF 38%), and LBBB presenting with sudden onset left sided chest and upper back pain, nausea and left arm tingling that woke her from sleep around 11:30 am, admitted for chest pain rule out.   Chest Pain  Initial troponin .03, EKG without findings concerning for ACS. Overall her symptoms appear likely secondary to GERD but cannot rule out anginal pain with increased cardiac risk factors. She has a heart score 4 and  sees Dr. Johnsie Cancel in the outpatient setting, nuclear stress test last year showed EF 38%. She had planned repeat echo this month once covid restrictions were lifted.   - admit to telemetry - trend troponins - HA1c, lipid panel - start protonix 20 mg qd  - cont. lipitor 40 mg qd  Hypertension Normotensive on home blood pressure medications,   - continue home quinapril-HCTZ 20-12.5 qd    Type II DM Home meds include janumet 50-1000mg  bid, lantus 8U qhs (she is taking 6U qhs), and jardiance 10 mg qd  SSI-moderate  Migraines Takes propranolol 20 mg bid and replax 20 mg prn for migraines   - cont. Propranolol 20 mg bid  Diet: carb modified VTE: lovenox  IVF: none  Code: full   Dispo: Admit patient to Observation with expected length of stay less than 2 midnights.  SignedMarty Heck, DO 07/11/2018, 4:03 AM  Pager: [phone]

## 2018-07-11 NOTE — Progress Notes (Signed)
Patient will be discharged with outpatient follow up On heart funiction.

## 2018-07-11 NOTE — ED Notes (Addendum)
ED TO INPATIENT HANDOFF REPORT  ED Nurse Name and Phone #: Gretta Cool [phone]  S Name/Age/Gender Sandra [name] 53 y.o. female Room/Bed: 016C/016C  Code Status   Code Status: Full Code  Home/SNF/Other Home Patient oriented to: self, place, time and situation Is this baseline? Yes   Triage Complete: Triage complete  Chief Complaint chest pain left arm numbness   Triage Note Patient reports waking up with central chest pressure that radiates into the back and down left arm.  Also endorses nausea, sob, and diaphoresis.     Allergies No Known Allergies  Level of Care/Admitting Diagnosis ED Disposition    ED Disposition Condition [facility] Area: [place] [[id number]]  Level of Care: Telemetry Cardiac [103]  Covid Evaluation: Screening Protocol (No Symptoms)  Diagnosis: Chest pain [607371]  Admitting Physician: Axel Filler [[id number]]  Attending Physician: Axel Filler [phone]  PT Class (Do Not Modify): Observation [104]  PT Acc Code (Do Not Modify): Observation [10022]       B Medical/Surgery History Past Medical History:  Diagnosis Date  . Acute pain of left shoulder 06/07/2017  . Asthma    as a child  . Cardiomyopathy ([place])    likely nonischemic DCM per Dr. Johnsie Cancel note 04/2017  . Carpal tunnel syndrome   . Diabetes mellitus type 2, uncontrolled (HCC) DX: 1999   Started as gestational diabetes  . ESSENTIAL HYPERTENSION 07/08/2006  . Gallstones   . HSV (herpes simplex virus) anogenital infection   . HYPERLIPIDEMIA 08/20/2007  . LBBB (left bundle branch block)   . Menorrhagia   . Migraine    Normal CT head (08/06/2006)  . Swelling of abdominal wall 03/02/2014   Past Surgical History:  Procedure Laterality Date  . CHOLECYSTECTOMY  2009  . DILATATION & CURETTAGE/HYSTEROSCOPY WITH MYOSURE N/A 12/25/2017   Procedure: DILATATION & CURETTAGE/HYSTEROSCOPY   POLYPECTOMY WITH MYOSURE;  Surgeon: Charyl Bigger, MD;   Location: [facility];  Service: Gynecology;  Laterality: N/A;  . TUBAL LIGATION       A IV Location/Drains/Wounds Patient Lines/Drains/Airways Status   Active Line/Drains/Airways    Name:   Placement date:   Placement time:   Site:   Days:   Incision (Closed) 12/25/17 Vagina Other (Comment)   12/25/17    1414     198          Intake/Output Last 24 hours No intake or output data in the 24 hours ending 07/11/18 0354  Labs/Imaging Results for orders placed or performed during the hospital encounter of 07/11/18 (from the past 48 hour(s))  Basic metabolic panel     Status: Abnormal   Collection Time: 07/11/18 12:49 AM  Result Value Ref Range   Sodium 139 135 - 145 mmol/L   Potassium 3.8 3.5 - 5.1 mmol/L   Chloride 104 98 - 111 mmol/L   CO2 22 22 - 32 mmol/L   Glucose, Bld 181 (H) 70 - 99 mg/dL   BUN 15 6 - 20 mg/dL   Creatinine, Ser 0.91 0.44 - 1.00 mg/dL   Calcium 9.3 8.9 - 10.3 mg/dL   GFR calc non Af Amer >60 >60 mL/min   GFR calc Af Amer >60 >60 mL/min   Anion gap 13 5 - 15    Comment: Performed at [facility], [place] [address], [place], [place] [address]  CBC     Status: Abnormal   Collection Time: 07/11/18 12:49 AM  Result Value Ref Range  WBC 7.9 4.0 - 10.5 K/uL   RBC 3.46 (L) 3.87 - 5.11 MIL/uL   Hemoglobin 11.1 (L) 12.0 - 15.0 g/dL   HCT 33.6 (L) 36.0 - 46.0 %   MCV 97.1 80.0 - 100.0 fL   MCH 32.1 26.0 - 34.0 pg   MCHC 33.0 30.0 - 36.0 g/dL   RDW 11.9 11.5 - 15.5 %   Platelets 311 150 - 400 K/uL   nRBC 0.0 0.0 - 0.2 %    Comment: Performed at [facility], [place] [address], [place], [place] [address]  Troponin I - ONCE - STAT     Status: None   Collection Time: 07/11/18 12:49 AM  Result Value Ref Range   Troponin I <0.03 <0.03 ng/mL    Comment: Performed at [facility] [place] [address], [place],  [address]  I-Stat beta hCG blood, ED     Status: None   Collection Time: 07/11/18 12:53 AM  Result Value Ref Range   I-stat hCG, quantitative  <5.0 <5 mIU/mL   Comment 3            Comment:   GEST. AGE      CONC.  (mIU/mL)   <=1 WEEK        5 - 50     2 WEEKS       50 - 500     3 WEEKS       100 - 10,000     4 WEEKS     1,000 - 30,000        FEMALE AND NON-PREGNANT FEMALE:     LESS THAN 5 mIU/mL    Dg Chest 2 View  Result Date: 07/11/2018 CLINICAL DATA:  Central chest pain EXAM: CHEST - 2 VIEW COMPARISON:  12/12/2017 FINDINGS: Heart and mediastinal contours are within normal limits. No focal opacities or effusions. No acute bony abnormality. IMPRESSION: No active cardiopulmonary disease. Electronically Signed   By: Rolm Baptise M.D.   On: 07/11/2018 01:03    Pending Labs Unresulted Labs (From admission, onward)    Start     Ordered   07/11/18 0600  Troponin I - Now Then Q6H  Now then every 6 hours,   R (with STAT occurrences)     07/11/18 0346   07/11/18 0353  Lipid panel  Add-on,   AD     07/11/18 0352   07/11/18 0352  Hemoglobin A1c  Add-on,   AD     07/11/18 0352   07/11/18 0325  HIV antibody (Routine Testing)  Once,   STAT     07/11/18 0331   07/11/18 0215  Novel Coronavirus,NAA,(SEND-OUT TO REF LAB - TAT 24-48 hrs); Hosp Order  (Asymptomatic Patients Labs)  Once,   STAT    Question:  Rule Out  Answer:  Yes   07/11/18 0215          Vitals/Pain Today's Vitals   07/11/18 0130 07/11/18 0145 07/11/18 0300 07/11/18 0314  BP: 140/77 (!) 156/90 128/80   Pulse: 91 96 95   Resp: 16 15 13    Temp:      TempSrc:      SpO2: 98% 99% 98%   Weight:      Height:      PainSc:    0-No pain    Isolation Precautions No active isolations  Medications Medications  enoxaparin (LOVENOX) injection 40 mg (has no administration in time range)  acetaminophen (TYLENOL) tablet 650 mg (has no administration  in time range)    Or  acetaminophen (TYLENOL) suppository 650 mg (has no administration in time range)  propranolol (INDERAL) tablet 20 mg (has no administration in time range)  quinapril-hydrochlorothiazide (ACCURETIC)  20-12.5 MG per tablet 1 tablet (has no administration in time range)  atorvastatin (LIPITOR) tablet 40 mg (has no administration in time range)  pantoprazole (PROTONIX) EC tablet 20 mg (has no administration in time range)  sodium chloride flush (NS) 0.9 % injection 3 mL (3 mLs Intravenous Given 07/11/18 0239)  aspirin chewable tablet 243 mg (243 mg Oral Given 07/11/18 0223)    Mobility walks Low fall risk   Focused Assessments Cardiac Assessment Handoff:  Cardiac Rhythm: Normal sinus rhythm Lab Results  Component Value Date   TROPONINI <0.03 07/11/2018   No results found for: DDIMER Does the Patient currently have chest pain? No     R Recommendations: See Admitting Provider Note  Report given to:   Additional Notes:  NSR with LBBB, no CAD history, co morbidities Of DM, HTN and Hyperlipidemia.

## 2018-07-11 NOTE — ED Provider Notes (Signed)
Patient seen/examined in the Emergency Department in conjunction with Advanced Practice Provider  Patient reports chest pain and SOB Exam : awake/alert, appears tearful but no other acute distress Plan: admit for chest pain evaluation, patient agreeable with plan     Ripley Fraise, MD 07/11/18 [phone]

## 2018-07-12 LAB — NOVEL CORONAVIRUS, NAA (HOSP ORDER, SEND-OUT TO REF LAB; TAT 18-24 HRS): SARS-CoV-2, NAA: NOT DETECTED

## 2018-07-13 LAB — TROPONIN I: Troponin I: 0.03 ng/mL (ref <0.03)

## 2018-07-17 ENCOUNTER — Telehealth: Payer: Self-pay | Admitting: *Deleted

## 2018-07-17 ENCOUNTER — Other Ambulatory Visit: Payer: Self-pay

## 2018-07-17 ENCOUNTER — Other Ambulatory Visit (INDEPENDENT_AMBULATORY_CARE_PROVIDER_SITE_OTHER): Payer: BC Managed Care – PPO

## 2018-07-17 DIAGNOSIS — E1169 Type 2 diabetes mellitus with other specified complication: Secondary | ICD-10-CM | POA: Diagnosis not present

## 2018-07-17 DIAGNOSIS — E669 Obesity, unspecified: Secondary | ICD-10-CM

## 2018-07-17 LAB — POCT GLYCOSYLATED HEMOGLOBIN (HGB A1C): Hemoglobin A1C: 8.3 % — AB (ref 4.0–5.6)

## 2018-07-17 LAB — GLUCOSE, CAPILLARY: Glucose-Capillary: 235 mg/dL — ABNORMAL HIGH (ref 70–99)

## 2018-07-17 NOTE — Telephone Encounter (Signed)
Yes, Thank you

## 2018-07-17 NOTE — Telephone Encounter (Signed)
Walk in Pt came to clinic for labs, ask for results of COVID test done inpt last week, informed NEG. Is this agreeable?

## 2018-07-18 LAB — BMP8+ANION GAP
Anion Gap: 21 mmol/L — ABNORMAL HIGH (ref 10.0–18.0)
BUN/Creatinine Ratio: 20 (ref 9–23)
BUN: 21 mg/dL (ref 6–24)
CO2: 22 mmol/L (ref 20–29)
Calcium: 9.9 mg/dL (ref 8.7–10.2)
Chloride: 96 mmol/L (ref 96–106)
Creatinine, Ser: 1.07 mg/dL — ABNORMAL HIGH (ref 0.57–1.00)
GFR calc Af Amer: 69 mL/min/{1.73_m2} (ref >59)
GFR calc non Af Amer: 60 mL/min/{1.73_m2} (ref >59)
Glucose: 247 mg/dL — ABNORMAL HIGH (ref 65–99)
Potassium: 4 mmol/L (ref 3.5–5.2)
Sodium: 139 mmol/L (ref 134–144)

## 2018-07-18 LAB — MICROALBUMIN / CREATININE URINE RATIO
Creatinine, Urine: 58.2 mg/dL
Microalb/Creat Ratio: <5 mg/g creat (ref 0–29)
Microalbumin, Urine: <3 ug/mL

## 2018-07-22 ENCOUNTER — Telehealth: Payer: Self-pay

## 2018-07-22 NOTE — Telephone Encounter (Signed)
Patient is doing well on Jardiance but has not been checking her blood glucose regularly. She checked her sugar once yesterday afternoon and it was in the 190s. I asked if she could check her sugars every morning before she eats and I will call her back in 1 week to see if we need to adjust anything of her other diabetes medications.

## 2018-07-31 ENCOUNTER — Telehealth: Payer: Self-pay

## 2018-07-31 DIAGNOSIS — E1169 Type 2 diabetes mellitus with other specified complication: Secondary | ICD-10-CM

## 2018-07-31 DIAGNOSIS — E669 Obesity, unspecified: Secondary | ICD-10-CM

## 2018-07-31 NOTE — Telephone Encounter (Signed)
Contacted patient to assess blood glucose control since medications changes were made. Blood sugars have 180s-280s in the morning w/ 2 outliers 326 and 360. Yesterday, her BG was 289 when she first woke up. She has been feeling sluggish. Has not been following diabetic diet; reports eating some sweets but is not eating sweets late at night.  Patient states her blood glucoses before had started Jardiance were not this high.    She is taking 6 units of insulin at night.  She reports being out of atorvastatin for 2 weeks, but the pharmacy has told her the insurance will not cover it again until August.  Med changes: increase basaglar to 10 units at night. Follow up by phone in 1 week to reassess.  We would like to start Victoza, but she just picked up her Basaglar and it was $25 so she would like to finish that before starting Victoza. Victoza rx sent in for when she runs out of insulin.

## 2018-08-11 MED ORDER — VICTOZA 18 MG/3ML ~~LOC~~ SOPN: 1.8000 mg | PEN_INJECTOR | Freq: Every day | SUBCUTANEOUS | 3 refills | Status: DC

## 2018-08-11 MED ORDER — METFORMIN HCL 1000 MG PO TABS: 1000.0000 mg | ORAL_TABLET | Freq: Two times a day (BID) | ORAL | 11 refills | Status: DC

## 2018-08-11 MED ORDER — SYNJARDY XR 5-1000 MG PO TB24: 1.0000 | ORAL_TABLET | Freq: Two times a day (BID) | ORAL | 11 refills | Status: DC

## 2018-08-12 MED ORDER — JANUMET 50-1000 MG PO TABS: 1.0000 | ORAL_TABLET | Freq: Two times a day (BID) | ORAL | 0 refills | Status: DC

## 2018-08-12 NOTE — Addendum Note (Signed)
Addended by: Forde Dandy on: 08/12/2018 11:56 AM   Modules accepted: Orders

## 2018-08-19 ENCOUNTER — Telehealth: Payer: Self-pay

## 2018-08-19 NOTE — Telephone Encounter (Signed)
Diabetes Management Follow Up Sandra [name] is a 54 y.o. female who was contacted for DM management. Identity was verified using date of birth and address.  Diabetes medications: 10 units insulin daily, Janumet (just started for 3 weeks), and Jardiance patient correctly reports DM regimen and adherence. Patient recently picked up 5 pens of her insulin which is about a 5 month supply for her. She stated she would not like to start Victoza until after she has finished her insulin since she paid $25 for the medication.  Home BG average 220ish mg/dL Hypoglycemia symptoms: not in the last month - home BG < 70 mg/dL Hyperglycemia symptoms: polyuria - home BG > 300 mg/dL  Sugars: 185 (7/28 am), 244, 200s, had one morning in the 300s. A little lower with the 10 units daily. 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 Exercises about one day a week.  Has no atorvastatin - needs a refill - has not had for about 2 weeks;  The patient verbalized understanding of information provided by repeating back concepts discussed.  Follow-up Told the patient we may follow up later this week to adjust her insulin since her sugars are running high.  Romie Levee, PharmD Candidate

## 2018-09-04 ENCOUNTER — Telehealth: Payer: Self-pay | Admitting: Pharmacist

## 2018-09-14 ENCOUNTER — Other Ambulatory Visit: Payer: Self-pay | Admitting: Internal Medicine

## 2018-09-14 DIAGNOSIS — E1169 Type 2 diabetes mellitus with other specified complication: Secondary | ICD-10-CM

## 2018-09-14 DIAGNOSIS — E669 Obesity, unspecified: Secondary | ICD-10-CM

## 2018-09-15 NOTE — Telephone Encounter (Signed)
Contacted patient for follow up DM medication management. Was successful reaching patient today after difficulty over the past few weeks contacting her by phone.  She confirms currently taking Janumet (sitagliptin+metformin) + Jardiance (empagliflozin) + Lantus due to patient preference and having leftover supply at home.   Attempting to transition to Synjardy (empagliflozin+metformin) + Victoza to help simplify her regimen, but recommend doing this in-person. [place] confirmed that these medication prescriptions are still on hold (patient has not yet picked up Synjardy or Victoza). Patient is scheduled for 9/4 with clinical pharmacy team to review/clarify medications and help with transition to this regimen.

## 2018-09-19 ENCOUNTER — Other Ambulatory Visit: Payer: Self-pay | Admitting: Internal Medicine

## 2018-09-19 DIAGNOSIS — E669 Obesity, unspecified: Secondary | ICD-10-CM

## 2018-09-19 DIAGNOSIS — E1169 Type 2 diabetes mellitus with other specified complication: Secondary | ICD-10-CM

## 2018-09-25 ENCOUNTER — Other Ambulatory Visit: Payer: Self-pay | Admitting: Internal Medicine

## 2018-09-25 NOTE — Telephone Encounter (Signed)
Appointment scheduled with patient 9/4 to review medications. Patient was advised to bring medications to visit.

## 2018-09-25 NOTE — Telephone Encounter (Signed)
Needs refill on janumet  ;pt contact [place] ([facility]), [place] - [place]

## 2018-09-25 NOTE — Telephone Encounter (Signed)
Pt states she has been out of Janumet for about 2wks. Per note below (copied and pasted from previous encounter)-Pharm D will attempt to transition pt to Synjardy (empagliflozin+metformin) + Victoza to help simplify her regimen. Pt states she has an appt scheduled for tomorrow afternoon with PharmD.  Will have PharmD reach out to pt regarding janumet refill and appt time for tomorrow as [facility] now closes early on Fridays.  September 15, 2018 Forde Dandy, PharmD  6:21 PM Note   Contacted patient for follow up DM medication management. Was successful reaching patient today after difficulty over the past few weeks contacting her by phone.  She confirms currently taking Janumet (sitagliptin+metformin) + Jardiance (empagliflozin) + Lantus due to patient preference and having leftover supply at home.   Attempting to transition to Synjardy (empagliflozin+metformin) + Victoza to help simplify her regimen, but recommend doing this in-person. [place] confirmed that these medication prescriptions are still on hold (patient has not yet picked up Synjardy or Victoza). Patient is scheduled for 9/4 with clinical pharmacy team to review/clarify medications and help with transition to this regimen.

## 2018-09-26 ENCOUNTER — Ambulatory Visit: Payer: BC Managed Care – PPO | Admitting: Pharmacist

## 2018-09-26 MED ORDER — VICTOZA 18 MG/3ML ~~LOC~~ SOPN: 1.8000 mg | PEN_INJECTOR | Freq: Every day | SUBCUTANEOUS | 3 refills | Status: DC

## 2018-09-26 NOTE — Progress Notes (Signed)
S: Sandra [name] is a 53 y.o. female reports to clinical pharmacist appointment for diabetes medication management.  No Known Allergies  Current Outpatient Medications:  .  acetaminophen (TYLENOL) 500 MG tablet, Take 1,000 mg by mouth every 6 (six) hours as needed for moderate pain or headache., Disp: , Rfl:  .  albuterol (PROVENTIL HFA;VENTOLIN HFA) 108 (90 Base) MCG/ACT inhaler, Inhale 1-2 puffs into the lungs every 6 (six) hours as needed for wheezing or shortness of breath., Disp: 1 Inhaler, Rfl: 0 .  atorvastatin (LIPITOR) 40 MG tablet, Take 1 tablet (40 mg total) by mouth daily., Disp: 90 tablet, Rfl: 3 .  BIOTIN PO, Take 500 mg by mouth daily., Disp: , Rfl:  .  CINNAMON PO, Take 1,000 mg by mouth 2 (two) times daily., Disp: , Rfl:  .  dextromethorphan-guaiFENesin (MUCINEX DM) 30-600 MG 12hr tablet, Take 1 tablet 2 (two) times daily by mouth. (Patient not taking: Reported on 07/11/2018), Disp: 10 tablet, Rfl: 0 .  eletriptan (RELPAX) 20 MG tablet, TAKE 1 TABLET BY MOUTH AS NEEDED FOR  MIGRAINE  OR  HEADACHE.  MAY  REPEAT  IN  2  HOURS  IF  HEADACHE  PERSISTS  OR  RECURS (Patient taking differently: Take 20 mg by mouth every 2 (two) hours as needed for headache. ), Disp: 10 tablet, Rfl: 0 .  Empagliflozin-metFORMIN HCl ER (SYNJARDY XR) 05-998 MG TB24, Take 1 tablet by mouth 2 (two) times daily with a meal., Disp: 60 tablet, Rfl: 11 .  fluticasone (FLONASE) 50 MCG/ACT nasal spray, Place 1 spray into both nostrils daily as needed for allergies. , Disp: , Rfl:  .  guaiFENesin-codeine (ROBITUSSIN AC) 100-10 MG/5ML syrup, Take 10 mLs by mouth at bedtime as needed for cough. (Patient not taking: Reported on 07/11/2018), Disp: 118 mL, Rfl: 0 .  HYDROcodone-acetaminophen (NORCO/VICODIN) 5-325 MG tablet, Take 1 tablet by mouth every 6 (six) hours as needed for severe pain. (Patient not taking: Reported on 07/11/2018), Disp: 5 tablet, Rfl: 0 .  ibuprofen (ADVIL,MOTRIN) 800 MG tablet, Take 1 tablet (800  mg total) by mouth every 8 (eight) hours as needed. (Patient not taking: Reported on 07/11/2018), Disp: 30 tablet, Rfl: 0 .  Insulin Pen Needle (B-D UF III MINI PEN NEEDLES) 31G X 5 MM MISC, 1 Stick by Does not apply route daily. 11.69, insulin requirement, Disp: 30 each, Rfl: 3 .  liraglutide (VICTOZA) 18 MG/3ML SOPN, Inject 0.3 mLs (1.8 mg total) into the skin daily. Start at 0.6 mg daily for 1 week, then 1.2 mg daily x 1 wk, then 1.8 mg daily thereafter, Disp: 3 pen, Rfl: 3 .  pantoprazole (PROTONIX) 20 MG tablet, Take 1 tablet (20 mg total) by mouth daily., Disp: 30 tablet, Rfl: 3 .  promethazine (PHENERGAN) 12.5 MG tablet, Take 1 tablet (12.5 mg total) by mouth every 6 (six) hours as needed for nausea., Disp: 12 tablet, Rfl: 0 .  propranolol (INDERAL) 20 MG tablet, TAKE 1 TABLET BY MOUTH TWICE DAILY (Patient taking differently: Take 20 mg by mouth 2 (two) times daily. ), Disp: 180 tablet, Rfl: 3 .  quinapril-hydrochlorothiazide (ACCURETIC) 20-12.5 MG tablet, Take 1 tablet by mouth once daily, Disp: 90 tablet, Rfl: 0 Past Medical History:  Diagnosis Date  . Acute pain of left shoulder 06/07/2017  . Asthma    as a child  . 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 Johnsie Cancel note 04/2017  . Carpal tunnel syndrome   . Diabetes mellitus  type 2, uncontrolled ([place]) DX: 1999   Started as gestational diabetes  . ESSENTIAL HYPERTENSION 07/08/2006  . Gallstones   . HSV (herpes simplex virus) anogenital infection   . HYPERLIPIDEMIA 08/20/2007  . LBBB (left bundle branch block)   . Menorrhagia   . Migraine    Normal CT head (08/06/2006)  . Swelling of abdominal wall 03/02/2014   Social History   Socioeconomic History  . Marital status: Married    Spouse name: Not on file  . Number of children: Not on file  . Years of education: Not on file  . Highest education level: Not on file  Occupational History  . Occupation: Chemical engineer  Social Needs  . Financial resource strain: Not on  file  . Food insecurity    Worry: Not on file    Inability: Not on file  . Transportation needs    Medical: Not on file    Non-medical: Not on file  Tobacco Use  . Smoking status: Never Smoker  . Smokeless tobacco: Never Used  Substance and Sexual Activity  . Alcohol use: Yes    Alcohol/week: 0.0 standard drinks    Comment: occ  . Drug use: No  . Sexual activity: Yes    Birth control/protection: Surgical  Lifestyle  . Physical activity    Days per week: Not on file    Minutes per session: Not on file  . Stress: Not on file  Relationships  . Social Herbalist on phone: Not on file    Gets together: Not on file    Attends religious service: Not on file    Active member of club or organization: Not on file    Attends meetings of clubs or organizations: Not on file    Relationship status: Not on file  Other Topics Concern  . Not on file  Social History Narrative   Studying at [employer] in early childhood education.   Married.   Family History  Problem Relation Age of Onset  . Hypertension Father   . Diabetes Father    O:    Component Value Date/Time   CHOL 126 07/11/2018 0430   CHOL 138 06/06/2017 1513   HDL 32 (L) 07/11/2018 0430   HDL 31 (L) 06/06/2017 1513   LDLCALC 65 07/11/2018 0430   LDLCALC 89 06/06/2017 1513   TRIG 143 07/11/2018 0430   GLUCOSE 247 (H) 07/17/2018 1457   GLUCOSE 181 (H) 07/11/2018 0049   HGBA1C 8.3 (A) 07/17/2018 1509   HGBA1C 8.6 (H) 07/11/2018 0426   NA 139 07/17/2018 1457   K 4.0 07/17/2018 1457   CL 96 07/17/2018 1457   CO2 22 07/17/2018 1457   BUN 21 07/17/2018 1457   CREATININE 1.07 (H) 07/17/2018 1457   CREATININE 0.66 05/07/2014 1605   CALCIUM 9.9 07/17/2018 1457   GFRNONAA 60 07/17/2018 1457   GFRNONAA >89 05/07/2014 1605   GFRAA 69 07/17/2018 1457   GFRAA >89 05/07/2014 1605   AST 31 02/21/2016 0715   ALT 26 02/21/2016 0715   WBC 7.9 07/11/2018 0049   HGB 11.1 (L) 07/11/2018 0049   HCT 33.6 (L) 07/11/2018 0049    PLT 311 07/11/2018 0049   TSH 2.361 05/15/2012 1632   Ht Readings from Last 2 Encounters:  07/11/18 5' (1.524 m)  05/12/18 5' (1.524 m)   Wt Readings from Last 2 Encounters:  07/11/18 206 lb 9.6 oz (93.7 kg)  05/12/18 196 lb (88.9 kg)   There is no  height or weight on file to calculate BMI. BP Readings from Last 3 Encounters:  07/11/18 (!) 153/85  04/01/18 (!) 154/69  01/07/18 129/70   A/P: Patient was educated about change in DM regimen from Janumet+Jardiance+Basaglar to Synjardy+Victoza (samples provided). Affordability was also confirmed between patient and pharmacy.  Patient has a follow up appointment with PCP on 9/17, will follow along in her care and provide further assistance if needed.   The patient verbalized understanding of information provided by repeating back concepts discussed.

## 2018-10-06 ENCOUNTER — Other Ambulatory Visit: Payer: Self-pay | Admitting: Internal Medicine

## 2018-10-06 NOTE — Telephone Encounter (Signed)
Next appt scheduled 9/17 with PCP. 

## 2018-10-07 NOTE — Telephone Encounter (Signed)
Refill approved.

## 2018-10-09 ENCOUNTER — Other Ambulatory Visit: Payer: Self-pay

## 2018-10-09 ENCOUNTER — Ambulatory Visit: Payer: BC Managed Care – PPO | Admitting: Internal Medicine

## 2018-10-09 ENCOUNTER — Encounter: Payer: Self-pay | Admitting: Internal Medicine

## 2018-10-09 VITALS — BP 131/76 | HR 99 | Temp 98.7°F | Ht 60.0 in | Wt 202.4 lb

## 2018-10-09 DIAGNOSIS — Z2821 Immunization not carried out because of patient refusal: Secondary | ICD-10-CM

## 2018-10-09 DIAGNOSIS — E669 Obesity, unspecified: Secondary | ICD-10-CM | POA: Diagnosis not present

## 2018-10-09 DIAGNOSIS — I110 Hypertensive heart disease with heart failure: Secondary | ICD-10-CM | POA: Diagnosis not present

## 2018-10-09 DIAGNOSIS — I5022 Chronic systolic (congestive) heart failure: Secondary | ICD-10-CM

## 2018-10-09 DIAGNOSIS — Z6839 Body mass index (BMI) 39.0-39.9, adult: Secondary | ICD-10-CM

## 2018-10-09 DIAGNOSIS — Z0000 Encounter for general adult medical examination without abnormal findings: Secondary | ICD-10-CM

## 2018-10-09 DIAGNOSIS — Z7984 Long term (current) use of oral hypoglycemic drugs: Secondary | ICD-10-CM

## 2018-10-09 DIAGNOSIS — I420 Dilated cardiomyopathy: Secondary | ICD-10-CM

## 2018-10-09 DIAGNOSIS — Z79899 Other long term (current) drug therapy: Secondary | ICD-10-CM

## 2018-10-09 DIAGNOSIS — I428 Other cardiomyopathies: Secondary | ICD-10-CM

## 2018-10-09 DIAGNOSIS — E1169 Type 2 diabetes mellitus with other specified complication: Secondary | ICD-10-CM

## 2018-10-09 LAB — POCT GLYCOSYLATED HEMOGLOBIN (HGB A1C): Hemoglobin A1C: 11.3 % — AB (ref 4.0–5.6)

## 2018-10-09 LAB — GLUCOSE, CAPILLARY: Glucose-Capillary: 170 mg/dL — ABNORMAL HIGH (ref 70–99)

## 2018-10-09 MED ORDER — SITAGLIPTIN PHOSPHATE 100 MG PO TABS: 100.0000 mg | ORAL_TABLET | Freq: Every day | ORAL | 0 refills | Status: DC

## 2018-10-09 NOTE — Patient Instructions (Addendum)
Thank you for allowing [name] to care for you  For your diabetes - A1c up to 11 today - We will restart Januvia today - Continue Synjardy and Victoza - Follow up in 1-2 months for meter check - Follow up in about 3 months for repeat A1c  Your heart Failure, High Blood Pressure, and Reflux are well controlled - Continue current medications

## 2018-10-09 NOTE — Assessment & Plan Note (Addendum)
A1c today is 11.3. Patient has been feeling well since her last visit. She has had medication adjustments since our last visit with the assistance of pharmacy.  She has stopped taking glipizide and Basaglar. She has started [place] (combining her metformin and new Jardiance together) and Victoza (started 2 weeks ago, will start full dose in the up coming week). She has tolerated these new medications well. She has also stopped Januvia.  She states she has done poorly with diet and exersice since the Pandemic began and she has been mostly staying at home; which may be contributing to rise in A1c.  Given her multiple medication changes and her reporting being out of one of her medications for about 2 weeks leading up to starting Victoza about 2 weeks ago it is hard to know what to attribute her rise in A1c to. Given her CBG is 170 today I do not think her control is too bad at this time. I will add back Januvia and she will start 1.8mg  on Victoza tomorrow. With this new regimen started consistently I expect to see an improvement in her CBGs and A1c; especially when combined with improved dietary habits. - Synjardy 05-998 BID - Victoza 1.8mg  Daily - Restart Januvia 100mg  Daily - Improve diet and lifestyle modifications  - Follow up in 1-2 months for meter check - Follow up in 3 months for A1c

## 2018-10-09 NOTE — Assessment & Plan Note (Addendum)
Patient has been seen by cardiology earlier in the year. They are working to repeat her echocardiogram. Last Echo in 2019 was 40-45%. They discussed possibly starting entresto, but have deferred to cost and considering she is asymptomatic. - Continue ACE-I and BB

## 2018-10-09 NOTE — Assessment & Plan Note (Signed)
Patient has been seen by cardiology earlier in the year. They are working to repeat her echocardiogram. Last Echo in 2019 was 40-45%. They discussed possibly starting entresto, but have deferred to cost and considering she is asymptomatic. - Continue ACE-I and BB

## 2018-10-09 NOTE — Assessment & Plan Note (Signed)
-   Patient refuses Flu vaccine - She states she has had her mammogram through [facility], she state she will have her records forwarded - She will have her eye exam results forwarded as well

## 2018-10-09 NOTE — Progress Notes (Signed)
   CC: Diabetes, Heart Failure, Preventative Healthcare  HPI:  [name] is a 53 y.o. F with PMHx listed below presenting for HTN, Diabetes, Heart Failure, Preventative Healthcare. Please see the A&P for the status of the patient's chronic medical problems.  Past Medical History:  Diagnosis Date  . Acute pain of left shoulder 06/07/2017  . Asthma    as a child  . 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 Johnsie Cancel note 04/2017  . Carpal tunnel syndrome   . Diabetes mellitus type 2, uncontrolled ([facility]) DX: 1999   Started as gestational diabetes  . ESSENTIAL HYPERTENSION 07/08/2006  . Gallstones   . HSV (herpes simplex virus) anogenital infection   . HYPERLIPIDEMIA 08/20/2007  . LBBB (left bundle branch block)   . Menorrhagia   . Migraine    Normal CT head (08/06/2006)  . Swelling of abdominal wall 03/02/2014   Review of Systems:  Performed and all others negative.  Physical Exam:  Vitals:   10/09/18 1424  BP: 131/76  Pulse: 99  Temp: 98.7 F (37.1 C)  TempSrc: Oral  SpO2: 97%  Weight: 202 lb 6.4 oz (91.8 kg)  Height: 5' (1.524 m)   Physical Exam Constitutional:      General: She is not in acute distress.    Appearance: Normal appearance.  Cardiovascular:     Rate and Rhythm: Normal rate and regular rhythm.     Pulses: Normal pulses.     Heart sounds: Normal heart sounds.  Pulmonary:     Effort: Pulmonary effort is normal. No respiratory distress.     Breath sounds: Normal breath sounds.  Abdominal:     General: Bowel sounds are normal. There is no distension.     Palpations: Abdomen is soft.     Tenderness: There is no abdominal tenderness.  Musculoskeletal:        General: No swelling or deformity.  Skin:    General: Skin is warm and dry.  Neurological:     General: No focal deficit present.     Mental Status: Mental status is at baseline.    Assessment & Plan:   See Encounters Tab for problem based charting.  Patient discussed with  Dr. Philipp Ovens

## 2018-10-10 ENCOUNTER — Encounter: Payer: Self-pay | Admitting: *Deleted

## 2018-10-10 NOTE — Progress Notes (Signed)
Internal Medicine Clinic Attending  Case discussed with Dr. Melvin  at the time of the visit.  We reviewed the resident's history and exam and pertinent patient test results.  I agree with the assessment, diagnosis, and plan of care documented in the resident's note.  

## 2018-10-14 ENCOUNTER — Telehealth: Payer: Self-pay

## 2018-10-14 DIAGNOSIS — R110 Nausea: Secondary | ICD-10-CM

## 2018-10-14 NOTE — Telephone Encounter (Signed)
Pt states she recently start on new med, it's making her feel nausea. Requesting med for the nausea. Please call pt back.

## 2018-10-16 MED ORDER — DIPHENHYDRAMINE HCL 25 MG PO TABS: 50.0000 mg | ORAL_TABLET | Freq: Three times a day (TID) | ORAL | 0 refills | Status: DC | PRN

## 2018-10-16 NOTE — Telephone Encounter (Signed)
Contacted patient by phone. She has had weeks of Nausea, but no vomiting. It started about when she started [place] and Victoza. Her symptoms are worse in the morning but come and go all day.  If symptoms do not improve will consider change in regimen. She denies fevers, chills, abdominal pain. - Will start with Diphenhydramine 50mg  q6h PRN due to decreased QT-prolongation, given recent ECG with prolong QTc.  - Will give a 2 weeks course, if not improving, will likely need to try alternative medication regimen.

## 2018-10-16 NOTE — Telephone Encounter (Signed)
Patient calling back. States she started victoza and synjardy at the same time after last OV on 10/09/2018. C/o nausea lasting at least 2 hours twice daily so she thinks it is the synjardy. Has continued to take the med despite the nausea but needs to know what she should do. Hubbard Hartshorn, RN, BSN

## 2018-10-22 ENCOUNTER — Telehealth: Payer: Self-pay | Admitting: Internal Medicine

## 2018-10-22 ENCOUNTER — Other Ambulatory Visit: Payer: Self-pay | Admitting: Internal Medicine

## 2018-10-22 NOTE — Telephone Encounter (Signed)
Patient called requesting to talk to her PCP about nausea medication.  She recently saw him in 9/18.  No documentation about nausea medicine.  There is some documentation about Benadryl prior to the visit but unclear if this medication was suppose to be continued.  I messaged Dr. Trilby Drummer for refill if indicated.   Welford Roche, MD  Internal Medicine PGY-3 P [phone]

## 2018-10-22 NOTE — Telephone Encounter (Signed)
Pt states she has been waiting since 10/16/2018 (Thursday ) to get her medication.  Patient states she still has  Nausea and needs her medication.    diphenhydrAMINE (BENADRYL) 25 MG tablet   [id number] -  ([place]), [place] - [address]

## 2018-10-22 NOTE — Telephone Encounter (Signed)
Pharmacy states script rec'd, it is OTC and they will call pt and inform her

## 2018-11-05 ENCOUNTER — Ambulatory Visit: Payer: BC Managed Care – PPO | Admitting: Internal Medicine

## 2018-11-05 ENCOUNTER — Other Ambulatory Visit: Payer: Self-pay

## 2018-11-05 ENCOUNTER — Telehealth: Payer: Self-pay

## 2018-11-05 ENCOUNTER — Encounter: Payer: Self-pay | Admitting: Internal Medicine

## 2018-11-05 VITALS — BP 132/76 | HR 104 | Temp 98.4°F | Ht 60.0 in | Wt 195.3 lb

## 2018-11-05 DIAGNOSIS — E1169 Type 2 diabetes mellitus with other specified complication: Secondary | ICD-10-CM | POA: Diagnosis not present

## 2018-11-05 DIAGNOSIS — Z7984 Long term (current) use of oral hypoglycemic drugs: Secondary | ICD-10-CM

## 2018-11-05 DIAGNOSIS — R9431 Abnormal electrocardiogram [ECG] [EKG]: Secondary | ICD-10-CM

## 2018-11-05 DIAGNOSIS — E669 Obesity, unspecified: Secondary | ICD-10-CM | POA: Diagnosis not present

## 2018-11-05 DIAGNOSIS — Z79899 Other long term (current) drug therapy: Secondary | ICD-10-CM

## 2018-11-05 DIAGNOSIS — R110 Nausea: Secondary | ICD-10-CM

## 2018-11-05 DIAGNOSIS — Z6838 Body mass index (BMI) 38.0-38.9, adult: Secondary | ICD-10-CM

## 2018-11-05 MED ORDER — BD PEN NEEDLE MINI U/F 31G X 5 MM MISC: 1.0000 | Freq: Every day | 3 refills | Status: DC

## 2018-11-05 MED ORDER — BASAGLAR KWIKPEN 100 UNIT/ML ~~LOC~~ SOPN: 5.0000 [IU] | PEN_INJECTOR | Freq: Every day | SUBCUTANEOUS | 2 refills | Status: DC

## 2018-11-05 MED ORDER — ONDANSETRON HCL 4 MG PO TABS: 4.0000 mg | ORAL_TABLET | Freq: Every day | ORAL | 0 refills | Status: DC | PRN

## 2018-11-05 NOTE — Telephone Encounter (Signed)
Requesting to speak with a nurse about feeling nausea. Please call pt back.

## 2018-11-05 NOTE — Patient Instructions (Addendum)
Thank you for allowing [name] to care for you  For your diabetes and nausea - Short course of zofran  - STOP Victoza - Synjardy 05-998 BID - Januvia 100mg  Daily - Restart Basaglar 5U Daily - Diet and lifestyle modifications  - Follow up in about 1 month with me

## 2018-11-05 NOTE — Assessment & Plan Note (Addendum)
Last A1c 11.3. She had had multiple medication adjustment made with the assistance of pharmacy, but has had significant nausea since these changes. Specifically she has had symptoms since starting Victoza. It has been more than a month of these symptoms, she has not had relief with benadryl and her prolonged qTC makes her a poor candidate for longer term use of stronger antiemetics.   Will discontinue Victoza and restart Basaglar and look for improvement in symptoms. Will continue Synjardy (metformin / Jardiance) and Januvia as she has tolerated these previously. Continue to encourage diet and exercise. - STOP Victoza - Synjardy 05-998 BID - Januvia 100mg  Daily - Restart Basaglar 5U Daily - Diet and lifestyle modifications  - Short course of zofran - Follow up in 2 months for A1c

## 2018-11-05 NOTE — Progress Notes (Signed)
   CC: nausea, diabetes  HPI:  [name] is a 53 y.o. F with PMHx listed below presenting for nausea, diabetes. Please see the A&P for the status of the patient's chronic medical problems.   Past Medical History:  Diagnosis Date  . Acute pain of left shoulder 06/07/2017  . Asthma    as a child  . 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 Johnsie Cancel note 04/2017  . Carpal tunnel syndrome   . Chest pain 07/11/2018  . Diabetes mellitus type 2, uncontrolled ([facility]) DX: 1999   Started as gestational diabetes  . ESSENTIAL HYPERTENSION 07/08/2006  . Gallstones   . HSV (herpes simplex virus) anogenital infection   . HYPERLIPIDEMIA 08/20/2007  . LBBB (left bundle branch block)   . Menorrhagia   . Migraine    Normal CT head (08/06/2006)  . Swelling of abdominal wall 03/02/2014   Review of Systems:  Performed and all others negative.  Physical Exam:  Vitals:   11/05/18 1315  BP: 132/76  Pulse: (!) 104  Temp: 98.4 F (36.9 C)  TempSrc: Oral  SpO2: 99%  Weight: 195 lb 4.8 oz (88.6 kg)  Height: 5' (1.524 m)    Physical Exam Constitutional:      General: She is not in acute distress.    Appearance: Normal appearance.  Cardiovascular:     Rate and Rhythm: Normal rate and regular rhythm.     Pulses: Normal pulses.     Heart sounds: Normal heart sounds.  Pulmonary:     Effort: Pulmonary effort is normal. No respiratory distress.     Breath sounds: Normal breath sounds.  Abdominal:     General: Bowel sounds are normal. There is no distension.     Palpations: Abdomen is soft.     Tenderness: There is no abdominal tenderness.  Musculoskeletal:        General: No swelling or deformity.  Skin:    General: Skin is warm and dry.  Neurological:     General: No focal deficit present.     Mental Status: Mental status is at baseline.    Assessment & Plan:   See Encounters Tab for problem based charting.  Patient discussed with Dr. Evette Doffing

## 2018-11-05 NOTE — Telephone Encounter (Signed)
Pt calls and states that benadryl has not helped, she is remaining ill and needs something done. ACC at 1315 10/14

## 2018-11-06 ENCOUNTER — Other Ambulatory Visit: Payer: Self-pay | Admitting: Internal Medicine

## 2018-11-06 NOTE — Addendum Note (Signed)
Addended by: Lalla Brothers T on: 11/06/2018 09:07 AM   Modules accepted: Level of Service

## 2018-11-06 NOTE — Progress Notes (Signed)
Internal Medicine Clinic Attending  Case discussed with Dr. Melvin  at the time of the visit.  We reviewed the resident's history and exam and pertinent patient test results.  I agree with the assessment, diagnosis, and plan of care documented in the resident's note.  

## 2018-11-07 NOTE — Telephone Encounter (Signed)
Refill approved.

## 2018-11-10 ENCOUNTER — Telehealth: Payer: Self-pay | Admitting: Internal Medicine

## 2018-11-10 NOTE — Telephone Encounter (Signed)
Called pt - no answer; left message on self-identified vm of letter which she can pick up; and call for any questions.

## 2018-11-10 NOTE — Telephone Encounter (Signed)
Pt is requesting a nurse to callback [phone]

## 2018-11-10 NOTE — Telephone Encounter (Signed)
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 Wants to know if it's ok for her to go back into the classroom d/t being high risk? And if she can, she will like a note stating she can wear a face shield (pt's preference to wear one). Thanks

## 2018-11-10 NOTE — Telephone Encounter (Signed)
She can return to work. I have place a note for her in my in-box.  Thank you

## 2018-11-14 NOTE — Progress Notes (Signed)
Evaluation Performed:  Follow-up visit  Date:  11/17/2018   ID:  Jeanelly, Keister [birth year], MRN [id number]  Provider Location: Office  PCP:  [name], MD  Cardiologist:  Johnsie Cancel Electrophysiologist:  None   Chief Complaint:   Dyspnea  History of Present Illness:    Sandra [name] is a 53 y.o. female with DM, HLD, HTN first seen 04/26/17 for dyspnea. Also had atypical chest pain with LBBB on ECG ER visit r/o normal CXR  History of NIDCM 2014 EF 45% Echo 05/09/17 same 40-45% no significant valve disease or pulmonary hypertension Myovue 05/21/27 normal  Estimated EF 38%  Done well on ACE/Diuretic/beta blocker / statin and ASA  Last CXR 12/12/17 NAD CE  Discussed Entresto and will not change at this time since she is doing well on less expensive medication.  Will need update echo for EF   75 yo mom living with her Son turns 58 today he is working at [employer] Discussed compliance with meds as she misses doses on occasion  No signs of CHF    Past Medical History:  Diagnosis Date  . Acute pain of left shoulder 06/07/2017  . Asthma    as a child  . 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 Johnsie Cancel note 04/2017  . Carpal tunnel syndrome   . Chest pain 07/11/2018  . Diabetes mellitus type 2, uncontrolled ([facility]) DX: 1999   Started as gestational diabetes  . ESSENTIAL HYPERTENSION 07/08/2006  . Gallstones   . HSV (herpes simplex virus) anogenital infection   . HYPERLIPIDEMIA 08/20/2007  . LBBB (left bundle branch block)   . Menorrhagia   . Migraine    Normal CT head (08/06/2006)  . Swelling of abdominal wall 03/02/2014   Past Surgical History:  Procedure Laterality Date  . CHOLECYSTECTOMY  2009  . DILATATION & CURETTAGE/HYSTEROSCOPY WITH MYOSURE N/A 12/25/2017   Procedure: DILATATION & CURETTAGE/HYSTEROSCOPY   POLYPECTOMY WITH MYOSURE;  Surgeon: Charyl Bigger, MD;  Location: [facility];  Service: Gynecology;  Laterality: N/A;  . TUBAL LIGATION        No outpatient medications have been marked as taking for the 11/17/18 encounter (Office Visit) with Josue Hector, MD.     Allergies:   Patient has no known allergies.   Social History   Tobacco Use  . Smoking status: Never Smoker  . Smokeless tobacco: Never Used  Substance Use Topics  . Alcohol use: Yes    Alcohol/week: 0.0 standard drinks    Comment: occ  . Drug use: No     Family Hx: The patient's family history includes Diabetes in her father; Hypertension in her father.  ROS:   Please see the history of present illness.     All other systems reviewed and are negative.   Prior CV studies:   The following studies were reviewed today:  CXR 12/12/17 TTE 05/09/17 Myovue 05/20/17  Labs/Other Tests and Data Reviewed:    EKG:   SR IVCD rate 96 03/19/17  Recent Labs: 07/11/2018: Hemoglobin 11.1; Platelets 311 07/17/2018: BUN 21; Creatinine, Ser 1.07; Potassium 4.0; Sodium 139   Recent Lipid Panel Lab Results  Component Value Date/Time   CHOL 126 07/11/2018 04:30 AM   CHOL 138 06/06/2017 03:13 PM   TRIG 143 07/11/2018 04:30 AM   HDL 32 (L) 07/11/2018 04:30 AM   HDL 31 (L) 06/06/2017 03:13 PM   CHOLHDL 3.9 07/11/2018 04:30 AM   LDLCALC 65 07/11/2018 04:30 AM  [place] 89 06/06/2017 03:13 PM    Wt Readings from Last 3 Encounters:  11/17/18 204 lb (92.5 kg)  11/05/18 195 lb 4.8 oz (88.6 kg)  10/09/18 202 lb 6.4 oz (91.8 kg)     Objective:    Vital Signs:  BP 128/68   Pulse 93   Ht 5' (1.524 m)   Wt 204 lb (92.5 kg)   SpO2 93%   BMI 39.84 kg/m    Affect appropriate Healthy:  appears stated age HEENT: normal Neck supple with no adenopathy JVP normal no bruits no thyromegaly Lungs clear with no wheezing and good diaphragmatic motion Heart:  S1/S2 no murmur, no rub, gallop or click PMI normal Abdomen: benighn, BS positve, no tenderness, no AAA no bruit.  No HSM or HJR Distal pulses intact with no bruits No edema Neuro non-focal Skin warm and dry  No muscular weakness   ASSESSMENT & PLAN:    1. DCM:  Stable on medical Rx functional class one Update echo now  2. HLD continue statin labs with primary 3. DM:  Discussed low carb diet.  Target hemoglobin A1c is 6.5 or less.  Continue current medications.  COVID-19 Education: The signs and symptoms of COVID-19 were discussed with the patient and how to seek care for testing (follow up with PCP or arrange E-visit).  The importance of social distancing was discussed today.  Time:   Today, I have spent 30 minutes with the patient    Medication Adjustments/Labs and Tests Ordered: Current medicines are reviewed at length with the patient today.  Concerns regarding medicines are outlined above.   Tests Ordered:  Echo for DCM    Medication Changes: No orders of the defined types were placed in this encounter.   Disposition:  Follow up in a year  Signed, Jenkins Rouge, MD  11/17/2018 3:51 PM    [place]

## 2018-11-17 ENCOUNTER — Ambulatory Visit: Payer: BC Managed Care – PPO | Admitting: Cardiovascular Disease

## 2018-11-17 ENCOUNTER — Encounter: Payer: Self-pay | Admitting: Cardiovascular Disease

## 2018-11-17 ENCOUNTER — Other Ambulatory Visit: Payer: Self-pay

## 2018-11-17 VITALS — BP 128/68 | HR 93 | Ht 60.0 in | Wt 204.0 lb

## 2018-11-17 DIAGNOSIS — I420 Dilated cardiomyopathy: Secondary | ICD-10-CM | POA: Diagnosis not present

## 2018-11-17 NOTE — Patient Instructions (Signed)
Medication Instructions:   *If you need a refill on your cardiac medications before your next appointment, please call your pharmacy*  Lab Work:  If you have labs (blood work) drawn today and your tests are completely normal, you will receive your results only by: . MyChart Message (if you have MyChart) OR . A paper copy in the mail If you have any lab test that is abnormal or we need to change your treatment, we will call you to review the results.  Testing/Procedures: Your physician has requested that you have an echocardiogram. Echocardiography is a painless test that uses sound waves to create images of your heart. It provides your doctor with information about the size and shape of your heart and how well your heart's chambers and valves are working. This procedure takes approximately one hour. There are no restrictions for this procedure.  Follow-Up: At [facility], you and your health needs are our priority.  As part of our continuing mission to provide you with exceptional heart care, we have created designated Provider Care Teams.  These Care Teams include your primary Cardiologist (physician) and Advanced Practice Providers (APPs -  Physician Assistants and Nurse Practitioners) who all work together to provide you with the care you need, when you need it.  Your next appointment:   12 month(s)  The format for your next appointment:   In Person  Provider:   You may see Dr. Nishan or one of the following Advanced Practice Providers on your designated Care Team:    Lori Gerhardt, NP  Laura Ingold, NP  Jill McDaniel, NP  

## 2018-11-18 ENCOUNTER — Telehealth: Payer: Self-pay | Admitting: Internal Medicine

## 2018-11-18 NOTE — Telephone Encounter (Signed)
Pt states that a coworker had contact with someone that may have had COVID, pt has not had any contact with anyone that she knows has been +, she stated she has no symptoms and wears mask, keeps hands and surfaces clean, and tries to maintain social distance. She is advised to call back for any symptoms or concerns. She is agreeable. She will call back when the other person's test comes back she states.

## 2018-11-18 NOTE — Telephone Encounter (Signed)
Pt calling to report Contact with a Co-Worker who tested Positive for COVID-19.  Please call patient back.

## 2018-11-28 ENCOUNTER — Other Ambulatory Visit: Payer: Self-pay

## 2018-11-28 ENCOUNTER — Ambulatory Visit (HOSPITAL_COMMUNITY): Payer: BC Managed Care – PPO | Attending: Cardiology

## 2018-11-28 DIAGNOSIS — I420 Dilated cardiomyopathy: Secondary | ICD-10-CM

## 2018-12-01 ENCOUNTER — Telehealth: Payer: Self-pay

## 2018-12-01 DIAGNOSIS — R931 Abnormal findings on diagnostic imaging of heart and coronary circulation: Secondary | ICD-10-CM

## 2018-12-01 MED ORDER — ENTRESTO 24-26 MG PO TABS: 1.0000 | ORAL_TABLET | Freq: Two times a day (BID) | ORAL | 11 refills | Status: DC

## 2018-12-01 NOTE — Telephone Encounter (Signed)
-----   Message from Josue Hector, MD sent at 11/29/2018 12:08 PM EST ----- EF worse by echo see if she can afford entresto. Hold qunipril for 48 hours if so and start low dose entresto f/u BMET in 3 weeks with pharm D appointment

## 2018-12-01 NOTE — Telephone Encounter (Signed)
Called patient with echo results. Per Dr. Johnsie Cancel, EF worse by echo see if she can afford entresto. Hold qunipril for 48 hours if so and start low dose entresto f/u BMET in 3 weeks with pharm D appointment. Patient's insurance should work with 30 day free trial card and $10.00 co-pay card. Informed patient of medication changes. Informed patient to stop taking qunipril, since she already took it today, she will start Entresto on 12/04/18. Patient will come in on 12/25/18 to see PharmD and get lab work. Left cards for Entresto at front desk and information about Entresto. Patient verbalized understanding of medication instructions.

## 2018-12-04 ENCOUNTER — Encounter: Payer: Self-pay | Admitting: Internal Medicine

## 2018-12-04 ENCOUNTER — Other Ambulatory Visit: Payer: Self-pay

## 2018-12-04 ENCOUNTER — Ambulatory Visit: Payer: BC Managed Care – PPO | Admitting: Internal Medicine

## 2018-12-04 VITALS — BP 127/74 | HR 96 | Temp 98.0°F | Ht 60.0 in | Wt 201.6 lb

## 2018-12-04 DIAGNOSIS — Z6839 Body mass index (BMI) 39.0-39.9, adult: Secondary | ICD-10-CM

## 2018-12-04 DIAGNOSIS — I5022 Chronic systolic (congestive) heart failure: Secondary | ICD-10-CM | POA: Diagnosis not present

## 2018-12-04 DIAGNOSIS — I110 Hypertensive heart disease with heart failure: Secondary | ICD-10-CM | POA: Diagnosis not present

## 2018-12-04 DIAGNOSIS — E1169 Type 2 diabetes mellitus with other specified complication: Secondary | ICD-10-CM | POA: Diagnosis not present

## 2018-12-04 DIAGNOSIS — Z79899 Other long term (current) drug therapy: Secondary | ICD-10-CM

## 2018-12-04 DIAGNOSIS — I10 Essential (primary) hypertension: Secondary | ICD-10-CM

## 2018-12-04 DIAGNOSIS — Z7984 Long term (current) use of oral hypoglycemic drugs: Secondary | ICD-10-CM

## 2018-12-04 DIAGNOSIS — E669 Obesity, unspecified: Secondary | ICD-10-CM | POA: Diagnosis not present

## 2018-12-04 LAB — POCT GLYCOSYLATED HEMOGLOBIN (HGB A1C): Hemoglobin A1C: 9.9 % — AB (ref 4.0–5.6)

## 2018-12-04 LAB — GLUCOSE, CAPILLARY: Glucose-Capillary: 179 mg/dL — ABNORMAL HIGH (ref 70–99)

## 2018-12-04 MED ORDER — BASAGLAR KWIKPEN 100 UNIT/ML ~~LOC~~ SOPN: 8.0000 [IU] | PEN_INJECTOR | Freq: Every day | SUBCUTANEOUS | 2 refills | Status: DC

## 2018-12-04 NOTE — Progress Notes (Signed)
   CC: Diabetes, Heart Failure, Hypertension  HPI:  [name] is a 53 y.o. F with PMHx listed below presenting for Diabetes, Heart Failure, Hypertension. Please see the A&P for the status of the patient's chronic medical problems.  Past Medical History:  Diagnosis Date  . Acute pain of left shoulder 06/07/2017  . Asthma    as a child  . 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 Johnsie Cancel note 04/2017  . Carpal tunnel syndrome   . Chest pain 07/11/2018  . Diabetes mellitus type 2, uncontrolled (HCC) DX: 1999   Started as gestational diabetes  . ESSENTIAL HYPERTENSION 07/08/2006  . Gallstones   . HSV (herpes simplex virus) anogenital infection   . HYPERLIPIDEMIA 08/20/2007  . LBBB (left bundle branch block)   . Menorrhagia   . Migraine    Normal CT head (08/06/2006)  . Swelling of abdominal wall 03/02/2014   Review of Systems:  Performed and all others negative.  Physical Exam:  Vitals:   12/04/18 1548  BP: 127/74  Pulse: 96  Temp: 98 F (36.7 C)  TempSrc: Oral  SpO2: 100%  Weight: 201 lb 9.6 oz (91.4 kg)  Height: 5' (1.524 m)   Physical Exam Constitutional:      General: She is not in acute distress.    Appearance: Normal appearance.  Cardiovascular:     Rate and Rhythm: Normal rate and regular rhythm.     Pulses: Normal pulses.     Heart sounds: Normal heart sounds.  Pulmonary:     Effort: Pulmonary effort is normal. No respiratory distress.     Breath sounds: Normal breath sounds.  Abdominal:     General: Bowel sounds are normal. There is no distension.     Palpations: Abdomen is soft.     Tenderness: There is no abdominal tenderness.  Musculoskeletal:        General: No swelling or deformity.  Skin:    General: Skin is warm and dry.  Neurological:     General: No focal deficit present.     Mental Status: Mental status is at baseline.    Assessment & Plan:   See Encounters Tab for problem based charting.  Patient discussed with Dr.  Rebeca [name]

## 2018-12-04 NOTE — Patient Instructions (Signed)
Thank you for allowing [name] to care for you  For your diabetes - A1c is improving at 9.9 today - Increase Basaglar to 8U daily, Decrease dose if you have symptoms of low blood sugar - Continue other medications - Follow up in 3 months  For your HTN and Heart Failure - BP is well controlled - Follow up with cardiology - We discussed the changes to your heart function today  Follow up in about 3 months

## 2018-12-04 NOTE — Assessment & Plan Note (Addendum)
Repeat Echo showed EF now reduced to 30-35%. NYHA Class I/II symptoms. She has transitioned from Her ACE-I to [facility] this week. She continues to take propranolol. She may need referral to EP for consideration of AICD, but will await her cardiologists recommendation. She has no Edema nor Rales on Exam.  We discussed the change in her EF and that she may require AICD placement in the future and transition to Carvedilol or Metoprolol as her BB. She has a lab visit planned with her cardiologist office, to assess for changes after starting entresto. I will review their next note when available as we consider further medication changes and next steps. Delene Loll 24-26mg  BID - Propranolol 20mg  BID - Cardiology follow up

## 2018-12-05 ENCOUNTER — Encounter: Payer: Self-pay | Admitting: Internal Medicine

## 2018-12-05 NOTE — Assessment & Plan Note (Signed)
A1c 9.9 today, which is improved from 11.3 at her last visit after being switched off of Victoza (Due to significant persistent nausea) and back on to [employer]. This improvement is with just 1 month of the new changes averaged in to her A1c.  She brought in her home glucose measurements, which have been in the high 100s to mid 200s. Given this, will increase Basaglar dose and have patient follow up in 3 months. Will also continue [place], and Januvia. - Synjardy 05-998 BID - Januvia 100mg  Daily - Increase Basaglar to 8U Daily - Instructed to reduce dose to 7U if lows or hypoglycemic Sx's occur - Diet and lifestyle modifications

## 2018-12-05 NOTE — Progress Notes (Signed)
Internal Medicine Clinic Attending  Case discussed with Dr. Melvin at the time of the visit.  We reviewed the resident's history and exam and pertinent patient test results.  I agree with the assessment, diagnosis, and plan of care documented in the resident's note.  Alexander Raines, M.D., Ph.D.  

## 2018-12-05 NOTE — Assessment & Plan Note (Addendum)
BP well controlled at 127/74 today She was transitioned off Accuretic and on to [facility] by cardiology due to declining EF, now 30-35%. Will continue current regimen given good BP control. - Entresto 24-26mg  BID - Propranolol 20mg  BID

## 2018-12-24 ENCOUNTER — Other Ambulatory Visit: Payer: Self-pay

## 2018-12-24 ENCOUNTER — Telehealth: Payer: Self-pay | Admitting: Internal Medicine

## 2018-12-24 DIAGNOSIS — Z20822 Contact with and (suspected) exposure to covid-19: Secondary | ICD-10-CM

## 2018-12-24 NOTE — Telephone Encounter (Signed)
Have attempted to call pt 2x this day, will cont to call

## 2018-12-24 NOTE — Telephone Encounter (Signed)
Pt reports her sister called and notified her last night she  Tested Positive for the COVID-19.  Pt states she was around her sister on Thanksgiving and has some questions.  Pt would like for a nurse to call her back.

## 2018-12-25 ENCOUNTER — Other Ambulatory Visit: Payer: BC Managed Care – PPO

## 2018-12-25 ENCOUNTER — Ambulatory Visit: Payer: BC Managed Care – PPO

## 2018-12-25 NOTE — Telephone Encounter (Signed)
Have left message today for rtc

## 2018-12-27 LAB — NOVEL CORONAVIRUS, NAA: SARS-CoV-2, NAA: NOT DETECTED

## 2018-12-31 ENCOUNTER — Other Ambulatory Visit: Payer: Self-pay | Admitting: *Deleted

## 2018-12-31 DIAGNOSIS — I10 Essential (primary) hypertension: Secondary | ICD-10-CM

## 2018-12-31 MED ORDER — PROPRANOLOL HCL 20 MG PO TABS: 20.0000 mg | ORAL_TABLET | Freq: Two times a day (BID) | ORAL | 3 refills | Status: DC

## 2018-12-31 NOTE — Telephone Encounter (Signed)
Refill approved.

## 2019-01-14 ENCOUNTER — Other Ambulatory Visit: Payer: BC Managed Care – PPO | Admitting: *Deleted

## 2019-01-14 ENCOUNTER — Other Ambulatory Visit: Payer: Self-pay

## 2019-01-14 ENCOUNTER — Ambulatory Visit (INDEPENDENT_AMBULATORY_CARE_PROVIDER_SITE_OTHER): Payer: BC Managed Care – PPO | Admitting: Pharmacist

## 2019-01-14 VITALS — BP 142/84 | HR 92

## 2019-01-14 DIAGNOSIS — R931 Abnormal findings on diagnostic imaging of heart and coronary circulation: Secondary | ICD-10-CM

## 2019-01-14 DIAGNOSIS — I5022 Chronic systolic (congestive) heart failure: Secondary | ICD-10-CM

## 2019-01-14 DIAGNOSIS — I10 Essential (primary) hypertension: Secondary | ICD-10-CM

## 2019-01-14 MED ORDER — METOPROLOL SUCCINATE ER 100 MG PO TB24: 100.0000 mg | ORAL_TABLET | Freq: Every day | ORAL | 3 refills | Status: DC

## 2019-01-14 NOTE — Patient Instructions (Addendum)
Stop taking propranolol  Start taking metoprolol 100mg  - 1 tablet once daily  Continue taking your other medications for now  Try to walk for 15 minutes most days a week  Cut back on fried food  I will give you a call in a week - if you are tolerating your medication well and if your labs are stable, we will plan to increase your Entresto dose

## 2019-01-14 NOTE — Progress Notes (Signed)
Patient ID: BOWEN BONTON                 DOB: August 31, 1965                      MRN: [id number]     HPI: Sandra [name] is a 53 y.o. female referred by Dr. Johnsie Cancel to pharmacy clinic for HF medication optimization. PMH is significant for HTN, DM, HLD, HFrEF with LVEF 30-35% on 11/20 echo. Pt was advised to stop quinapril and start Entresto after most recent echo. She presents today for follow up.  Pt presents today in good spirits. She reports tolerating her Entresto well - copay card worked and med is affordable. She does not have a BP cuff at home. Denies headaches, dizziness, swelling, or falls. Did experience dizziness once yesterday when she turned her head too quickly.  Current HTN meds: Entresto 24-26mg  BID, empagliflozin 5mg  daily Previously tried: quinapril  BP goal: <130/67mmHg  Family History: DM and HTN in her father.  Social History: denies tobacco, alcohol, and illicit drug use.  Diet: Eats fried food frequently. Likes shakes with spinach. Likes seafood and [employer] when she eats out. Doesn't add salt to food. Drinks water, limited caffeine.  Exercise: Minimal  Home BP readings: Doesn't have a BP cuff  Wt Readings from Last 3 Encounters:  12/04/18 201 lb 9.6 oz (91.4 kg)  11/17/18 204 lb (92.5 kg)  11/05/18 195 lb 4.8 oz (88.6 kg)   BP Readings from Last 3 Encounters:  12/04/18 127/74  11/17/18 128/68  11/05/18 132/76   Pulse Readings from Last 3 Encounters:  12/04/18 96  11/17/18 93  11/05/18 (!) 104    Renal function: CrCl cannot be calculated (Patient's most recent lab result is older than the maximum 21 days allowed.).  Past Medical History:  Diagnosis Date  . Acute pain of left shoulder 06/07/2017  . Asthma    as a child  . 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 Johnsie Cancel note 04/2017  . Carpal tunnel syndrome   . Chest pain 07/11/2018  . Diabetes mellitus type 2, uncontrolled (HCC) DX: 1999   Started as gestational diabetes  .  ESSENTIAL HYPERTENSION 07/08/2006  . Gallstones   . HSV (herpes simplex virus) anogenital infection   . HYPERLIPIDEMIA 08/20/2007  . LBBB (left bundle branch block)   . Menorrhagia   . Migraine    Normal CT head (08/06/2006)  . Swelling of abdominal wall 03/02/2014  . Swelling of abdominal wall 03/02/2014  . Symptoms of upper respiratory infection (URI) 04/01/2018  . Viral URI with cough 01/13/2013    Current Outpatient Medications on File Prior to Visit  Medication Sig Dispense Refill  . acetaminophen (TYLENOL) 500 MG tablet Take 1,000 mg by mouth every 6 (six) hours as needed for moderate pain or headache.    . albuterol (PROVENTIL HFA;VENTOLIN HFA) 108 (90 Base) MCG/ACT inhaler Inhale 1-2 puffs into the lungs every 6 (six) hours as needed for wheezing or shortness of breath. 1 Inhaler 0  . atorvastatin (LIPITOR) 40 MG tablet Take 1 tablet (40 mg total) by mouth daily. 90 tablet 3  . BIOTIN PO Take 500 mg by mouth daily.    Marland Kitchen CINNAMON PO Take 1,000 mg by mouth 2 (two) times daily.    Marland Kitchen dextromethorphan-guaiFENesin (MUCINEX DM) 30-600 MG 12hr tablet Take 1 tablet 2 (two) times daily by mouth. (Patient not taking: Reported on 07/11/2018) 10 tablet 0  .  diphenhydrAMINE (BENADRYL) 25 MG tablet Take 2 tablets (50 mg total) by mouth every 8 (eight) hours as needed for up to 14 days (Nausea). 30 tablet 0  . eletriptan (RELPAX) 20 MG tablet TAKE 1 TABLET BY MOUTH AS NEEDED FOR  MIGRAINE  OR  HEADACHE.  MAY  REPEAT  IN  2  HOURS  IF  HEADACHE  PERSISTS  OR  RECURS (Patient taking differently: Take 20 mg by mouth every 2 (two) hours as needed for headache. ) 10 tablet 0  . Empagliflozin-metFORMIN HCl ER (SYNJARDY XR) 05-998 MG TB24 Take 1 tablet by mouth 2 (two) times daily with a meal. 60 tablet 11  . fluticasone (FLONASE) 50 MCG/ACT nasal spray Place 1 spray into both nostrils daily as needed for allergies.     Marland Kitchen guaiFENesin-codeine (ROBITUSSIN AC) 100-10 MG/5ML syrup Take 10 mLs by mouth at bedtime as  needed for cough. (Patient not taking: Reported on 07/11/2018) 118 mL 0  . HYDROcodone-acetaminophen (NORCO/VICODIN) 5-325 MG tablet Take 1 tablet by mouth every 6 (six) hours as needed for severe pain. (Patient not taking: Reported on 07/11/2018) 5 tablet 0  . ibuprofen (ADVIL,MOTRIN) 800 MG tablet Take 1 tablet (800 mg total) by mouth every 8 (eight) hours as needed. (Patient not taking: Reported on 07/11/2018) 30 tablet 0  . Insulin Glargine (BASAGLAR KWIKPEN) 100 UNIT/ML SOPN Inject 0.08 mLs (8 Units total) into the skin daily. 3 mL 2  . Insulin Pen Needle (B-D UF III MINI PEN NEEDLES) 31G X 5 MM MISC 1 Stick by Does not apply route daily. 11.69, insulin requirement 30 each 3  . liraglutide (VICTOZA) 18 MG/3ML SOPN Inject 0.3 mLs (1.8 mg total) into the skin daily. Start at 0.6 mg daily for 1 week, then 1.2 mg daily x 1 wk, then 1.8 mg daily thereafter 3 pen 3  . ondansetron (ZOFRAN) 4 MG tablet Take 1 tablet (4 mg total) by mouth daily as needed for up to 10 doses for nausea or vomiting. 10 tablet 0  . pantoprazole (PROTONIX) 20 MG tablet Take 1 tablet by mouth once daily 90 tablet 1  . promethazine (PHENERGAN) 12.5 MG tablet Take 1 tablet (12.5 mg total) by mouth every 6 (six) hours as needed for nausea. 12 tablet 0  . propranolol (INDERAL) 20 MG tablet Take 1 tablet (20 mg total) by mouth 2 (two) times daily. 180 tablet 3  . sacubitril-valsartan (ENTRESTO) 24-26 MG Take 1 tablet by mouth 2 (two) times daily. Start on 12/04/18 after discontinuing Quinapril-hydrochlorothiazide for 48 hours 60 tablet 11  . sitaGLIPtin (JANUVIA) 100 MG tablet Take 1 tablet (100 mg total) by mouth daily. 90 tablet 0   No current facility-administered medications on file prior to visit.    No Known Allergies   Assessment/Plan:  1. Hypertension/HF medication optimization - BP a bit higher in clinic today than at recent visits, above goal <130/50mmHg. Have room to start optimizing HF medications. Will stop  propranolol and start Toprol 100mg  daily for CHF benefit. Checking BMET today with recent Entresto start. Continue Entresto 24-26mg  BID and Jardiance 5mg  daily. Will call pt in 1 week and if tolerating beta blocker change well, will increase Entresto to 49-51mg  BID and schedule follow up in clinic after that. Hopeful that BP in the future will allow addition of low dose spironolactone as well. Encouraged pt to start walking for 15 minutes daily and to cut back on fried foods. F/u with pt in 1 week via telephone.  Jinny Blossom  Vivien Rota, PharmD, BCACP, [place] [id number], [place], [place] [address] Phone: [phone]; Fax: [phone] 01/14/2019 9:16 AM

## 2019-01-15 LAB — BASIC METABOLIC PANEL
BUN/Creatinine Ratio: 19 (ref 9–23)
BUN: 14 mg/dL (ref 6–24)
CO2: 22 mmol/L (ref 20–29)
Calcium: 9.3 mg/dL (ref 8.7–10.2)
Chloride: 104 mmol/L (ref 96–106)
Creatinine, Ser: 0.74 mg/dL (ref 0.57–1.00)
GFR calc Af Amer: 108 mL/min/{1.73_m2} (ref >59)
GFR calc non Af Amer: 93 mL/min/{1.73_m2} (ref >59)
Glucose: 204 mg/dL — ABNORMAL HIGH (ref 65–99)
Potassium: 4 mmol/L (ref 3.5–5.2)
Sodium: 141 mmol/L (ref 134–144)

## 2019-01-19 ENCOUNTER — Telehealth: Payer: Self-pay | Admitting: Cardiovascular Disease

## 2019-01-19 NOTE — Telephone Encounter (Signed)
Patient returning call for lab results. 

## 2019-01-19 NOTE — Telephone Encounter (Signed)
Called patient back with lab results. Per Dr. Johnsie Cancel, Bmet ok BS too high f/u primary. Sent copy to results to Dr. Trilby Drummer already. Patient verbalized understanding.

## 2019-01-21 ENCOUNTER — Telehealth: Payer: Self-pay | Admitting: Pharmacist

## 2019-01-21 NOTE — Telephone Encounter (Signed)
Called pt to follow up with med change - she states she didn't start her metoprolol until 2 days ago. She did stop her propranolol. Feeling ok so far. Will call pt mid next week. If BP stable and pt feeling well, will increase Entresto and schedule f/u 1 week after in pharmacy clinic.

## 2019-01-27 MED ORDER — ENTRESTO 49-51 MG PO TABS: 1.0000 | ORAL_TABLET | Freq: Two times a day (BID) | ORAL | 11 refills | Status: DC

## 2019-01-27 NOTE — Addendum Note (Signed)
Addended by: Janay Canan E on: 01/27/2019 09:40 AM   Modules accepted: Orders

## 2019-01-27 NOTE — Telephone Encounter (Signed)
Called pt who states she is feeling well. Has not checked her BP at home. BP was still running a bit high at last PharmD visit - will increase Entresto to 49-51mg  BID tablets. Pt is out of town until 1/10 and has enough of her current dose of Entresto. Advised her to use up current supply, pick up new rx for 49-51mg  BID dosing on 1/10. Scheduled f/u in pharmacy clinic on 1/17 for BP check and BMET. She verbalized understanding.

## 2019-02-09 NOTE — Progress Notes (Signed)
Patient ID: Sandra [name]                 DOB: 07/26/1965                      MRN: [id number]     HPI: Sandra [name] is a 54 y.o. female referred by Dr. Johnsie Cancel to pharmacy clinic for HF medication optimization. PMH is significant for HTN, DM, HLD, HFrEF with LVEF 30-35% on 11/20 echo. Pt was advised to stop quinapril and start Entresto after most recent echo.   At last visit with PharmD on 01/14/19, the patient reported tolerating Entresto and finding it affordable w/ copay card. Provider switched propranolol to metoprolol succinate for heart failure benefit. BMET from 01/14/19 was stable except elevated glucose at 204. Entresto was increased to 49-51 mg due to elevated BP of 142/84 mmHg.  The patient presents in clinic today in good spirits. She reports infrequent dizziness that occurs randomly, but no shortness of breath or swelling. She does not attribute dizziness HTN/HF to medications. She confirms taking all medications as instructed and has no concerns. She does not have a BP cuff at home, but is considering buying one. She has not bought a BP cuff yet due to life stressors. The patient's exercise is minimal and she reports continuing to eat fried foods and take-out. BP in clinic is 142/78 and HR 92.   Current HTN/HF meds: Entresto 49-51 mg twice a day, metoprolol succinate 100 mg once daily, Synjardy (empagliflozin/metformin) 05/998 mg twice a day Previously tried: quinapril (switched to Entresto) BP goal: <130/40mmHg  Family History: DM and HTN in her father.  Social History: denies tobacco, alcohol, and illicit drug use.  Diet:  Eats fried food three times a week. Eats takeout 3-4 times a week. Likes shakes with spinach. Likes seafood and [employer] when she eats out. Doesn't add salt to food. Drinks water, limited caffeine.  Exercise: Takes a 15 min walk 1-2 times a week.  Home BP readings: Patient not currently checking BP at home.  Wt Readings from Last 3 Encounters:    12/04/18 201 lb 9.6 oz (91.4 kg)  11/17/18 204 lb (92.5 kg)  11/05/18 195 lb 4.8 oz (88.6 kg)   BP Readings from Last 3 Encounters:  01/14/19 (!) 142/84  12/04/18 127/74  11/17/18 128/68   Pulse Readings from Last 3 Encounters:  01/14/19 92  12/04/18 96  11/17/18 93    Renal function: CrCl cannot be calculated (Patient's most recent lab result is older than the maximum 21 days allowed.).  Past Medical History:  Diagnosis Date  . Acute pain of left shoulder 06/07/2017  . Asthma    as a child  . 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 Johnsie Cancel note 04/2017  . Carpal tunnel syndrome   . Chest pain 07/11/2018  . Diabetes mellitus type 2, uncontrolled (HCC) DX: 1999   Started as gestational diabetes  . ESSENTIAL HYPERTENSION 07/08/2006  . Gallstones   . HSV (herpes simplex virus) anogenital infection   . HYPERLIPIDEMIA 08/20/2007  . LBBB (left bundle branch block)   . Menorrhagia   . Migraine    Normal CT head (08/06/2006)  . Swelling of abdominal wall 03/02/2014  . Swelling of abdominal wall 03/02/2014  . Symptoms of upper respiratory infection (URI) 04/01/2018  . Viral URI with cough 01/13/2013    Current Outpatient Medications on File Prior to Visit  Medication Sig Dispense Refill  .  acetaminophen (TYLENOL) 500 MG tablet Take 1,000 mg by mouth every 6 (six) hours as needed for moderate pain or headache.    . albuterol (PROVENTIL HFA;VENTOLIN HFA) 108 (90 Base) MCG/ACT inhaler Inhale 1-2 puffs into the lungs every 6 (six) hours as needed for wheezing or shortness of breath. 1 Inhaler 0  . atorvastatin (LIPITOR) 40 MG tablet Take 1 tablet (40 mg total) by mouth daily. 90 tablet 3  . BIOTIN PO Take 500 mg by mouth daily.    Marland Kitchen CINNAMON PO Take 1,000 mg by mouth 2 (two) times daily.    Marland Kitchen dextromethorphan-guaiFENesin (MUCINEX DM) 30-600 MG 12hr tablet Take 1 tablet 2 (two) times daily by mouth. (Patient not taking: Reported on 07/11/2018) 10 tablet 0  . diphenhydrAMINE  (BENADRYL) 25 MG tablet Take 2 tablets (50 mg total) by mouth every 8 (eight) hours as needed for up to 14 days (Nausea). 30 tablet 0  . eletriptan (RELPAX) 20 MG tablet TAKE 1 TABLET BY MOUTH AS NEEDED FOR  MIGRAINE  OR  HEADACHE.  MAY  REPEAT  IN  2  HOURS  IF  HEADACHE  PERSISTS  OR  RECURS (Patient taking differently: Take 20 mg by mouth every 2 (two) hours as needed for headache. ) 10 tablet 0  . Empagliflozin-metFORMIN HCl ER (SYNJARDY XR) 05-998 MG TB24 Take 1 tablet by mouth 2 (two) times daily with a meal. 60 tablet 11  . fluticasone (FLONASE) 50 MCG/ACT nasal spray Place 1 spray into both nostrils daily as needed for allergies.     Marland Kitchen guaiFENesin-codeine (ROBITUSSIN AC) 100-10 MG/5ML syrup Take 10 mLs by mouth at bedtime as needed for cough. (Patient not taking: Reported on 07/11/2018) 118 mL 0  . HYDROcodone-acetaminophen (NORCO/VICODIN) 5-325 MG tablet Take 1 tablet by mouth every 6 (six) hours as needed for severe pain. (Patient not taking: Reported on 07/11/2018) 5 tablet 0  . Insulin Glargine (BASAGLAR KWIKPEN) 100 UNIT/ML SOPN Inject 0.08 mLs (8 Units total) into the skin daily. 3 mL 2  . Insulin Pen Needle (B-D UF III MINI PEN NEEDLES) 31G X 5 MM MISC 1 Stick by Does not apply route daily. 11.69, insulin requirement 30 each 3  . metoprolol succinate (TOPROL-XL) 100 MG 24 hr tablet Take 1 tablet (100 mg total) by mouth daily. Take with or immediately following a meal. 90 tablet 3  . ondansetron (ZOFRAN) 4 MG tablet Take 1 tablet (4 mg total) by mouth daily as needed for up to 10 doses for nausea or vomiting. 10 tablet 0  . pantoprazole (PROTONIX) 20 MG tablet Take 1 tablet by mouth once daily 90 tablet 1  . promethazine (PHENERGAN) 12.5 MG tablet Take 1 tablet (12.5 mg total) by mouth every 6 (six) hours as needed for nausea. 12 tablet 0  . sacubitril-valsartan (ENTRESTO) 49-51 MG Take 1 tablet by mouth 2 (two) times daily. 60 tablet 11  . sitaGLIPtin (JANUVIA) 100 MG tablet Take 1 tablet  (100 mg total) by mouth daily. 90 tablet 0   No current facility-administered medications on file prior to visit.    No Known Allergies   Assessment/Plan:  1. HTN/CHF medication management- BP still above goal of <130/80 mmHG, despite recent increase of Entresto. No changes were made today. Patient just started taking Entresto 49/51mg  last Thursday and does not want to increase dose yet due to cost. Continue taking Entresto 49-51 mg twice a day, metoprolol succinate 100 mg once daily, Synjardy 05/998 mg twice a day. Will check  BMET today and call patient tomorrow to discuss increasing Entresto to 97/103 mg twice a day (can do 2 tabs  of 49/51 mg twice daily to help with cost). Advised patient to purchase a BP cuff and check BP and HR once a day. Discussed with patient the importance of increasing exercise and limiting salt, fried foods, and take out in diet. Patient verbalized understanding. Will follow-up with patient in 2 weeks for in office visit.   Julieta Bellini, PharmD Candidate  Drexel Iha, PGY-2 Pharmacy Resident  Ramond Dial, Pharm.D, BCPS, CPP [place]  [id number], [place], [place] [address]  Phone: [phone]; Fax: [phone]

## 2019-02-10 ENCOUNTER — Ambulatory Visit (INDEPENDENT_AMBULATORY_CARE_PROVIDER_SITE_OTHER): Payer: BC Managed Care – PPO | Admitting: Pharmacist

## 2019-02-10 ENCOUNTER — Other Ambulatory Visit: Payer: Self-pay

## 2019-02-10 VITALS — BP 142/78 | HR 92 | Temp 99.0°F

## 2019-02-10 DIAGNOSIS — I5022 Chronic systolic (congestive) heart failure: Secondary | ICD-10-CM

## 2019-02-10 NOTE — Patient Instructions (Signed)
Today we will get a BMET and we will call you tomorrow with results and if discuss changes with Entresto.

## 2019-02-10 NOTE — Progress Notes (Deleted)
HPI:   Sandra [name] is a 54 y.o. female referred by Dr. Johnsie Cancel to pharmacy clinic for HF medication optimization. PMH is significant for HTN, DM, HLD, HFrEF with LVEF 30-35% on 11/20 echo. Pt was advised to stop quinapril and start Entresto after most recent echo.   At last visit with PharmD on 01/14/19, the patient reported tolerating Entresto and finding it affordable w/ copay card. Provider switched propranolol to metoprolol succinate for heart failure benefit. BMET from 01/14/19 was stable except elevated glucose at 204. Entresto was increased to 49-51 mg due to elevated BP.   The patient presents in clinic today in good spirits. Patient reports infrequent dizziness. She denies SOB, swelling. She does not remember any of her BP readings and is not checking her BP readings currently. Patient states she remembers Dr. Onnie Graham advising her to check her BP, however, states she has been experiencing multiple life stressors. Since last appt she states she has increased her exercise frequency and has been working on her diet. She states she has went "out to eat" 3-4x this past week.    Any dizziness form Entresto? Are you checking BP at home now? Any readings? Any swelling, SOB, dizziness? Changes in diet w/ fried foods and exercise?   Current HF meds: Entresto 49-51 mg twice a day, metoprolol succinate 100 mg once daily, Synjardy (empagliflozin/metformin) 05/998 mg twice a day  Previously tried: quinapril (switched to Entresto)   BP goal: <130/53mmHg   Family History: DM and HTN in her father.   Social History: denies tobacco, alcohol, and illicit drug use.   Diet: Eats fried food frequently. Likes shakes with spinach. Likes seafood and [employer] when she eats out. Doesn't add salt to food. Drinks water, limited caffeine.   Exercise: Minimal   Home BP readings: not monitoring      Wt Readings from Last 3 Encounters:  12/04/18 201 lb 9.6 oz (91.4 kg)  11/17/18 204 lb (92.5 kg)    11/05/18 195 lb 4.8 oz (88.6 kg)      BP Readings from Last 3 Encounters:  01/14/19 (!) 142/84  12/04/18 127/74  11/17/18 128/68      Pulse Readings from Last 3 Encounters:  01/14/19 92  12/04/18 96  11/17/18 93   Renal function:  CrCl cannot be calculated (Patient's most recent lab result is older than the maximum 21 days allowed.).      Past Medical History:  Diagnosis Date  . Acute pain of left shoulder 06/07/2017  . Asthma    as a child  . 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 Johnsie Cancel note 04/2017  . Carpal tunnel syndrome   . Chest pain 07/11/2018  . Diabetes mellitus type 2, uncontrolled (HCC) DX: 1999   Started as gestational diabetes  . ESSENTIAL HYPERTENSION 07/08/2006  . Gallstones   . HSV (herpes simplex virus) anogenital infection   . HYPERLIPIDEMIA 08/20/2007  . LBBB (left bundle branch block)   . Menorrhagia   . Migraine    Normal CT head (08/06/2006)  . Swelling of abdominal wall 03/02/2014  . Swelling of abdominal wall 03/02/2014  . Symptoms of upper respiratory infection (URI) 04/01/2018  . Viral URI with cough 01/13/2013         Current Outpatient Medications on File Prior to Visit  Medication Sig Dispense Refill  . acetaminophen (TYLENOL) 500 MG tablet Take 1,000 mg by mouth every 6 (six) hours as needed for moderate pain or headache.    Marland Kitchen  albuterol (PROVENTIL HFA;VENTOLIN HFA) 108 (90 Base) MCG/ACT inhaler Inhale 1-2 puffs into the lungs every 6 (six) hours as needed for wheezing or shortness of breath. 1 Inhaler 0  . atorvastatin (LIPITOR) 40 MG tablet Take 1 tablet (40 mg total) by mouth daily. 90 tablet 3  . BIOTIN PO Take 500 mg by mouth daily.    Marland Kitchen CINNAMON PO Take 1,000 mg by mouth 2 (two) times daily.    Marland Kitchen dextromethorphan-guaiFENesin (MUCINEX DM) 30-600 MG 12hr tablet Take 1 tablet 2 (two) times daily by mouth. (Patient not taking: Reported on 07/11/2018) 10 tablet 0  . diphenhydrAMINE (BENADRYL) 25 MG tablet Take 2 tablets (50 mg  total) by mouth every 8 (eight) hours as needed for up to 14 days (Nausea). 30 tablet 0  . eletriptan (RELPAX) 20 MG tablet TAKE 1 TABLET BY MOUTH AS NEEDED FOR MIGRAINE OR HEADACHE. MAY REPEAT IN 2 HOURS IF HEADACHE PERSISTS OR RECURS (Patient taking differently: Take 20 mg by mouth every 2 (two) hours as needed for headache. ) 10 tablet 0  . Empagliflozin-metFORMIN HCl ER (SYNJARDY XR) 05-998 MG TB24 Take 1 tablet by mouth 2 (two) times daily with a meal. 60 tablet 11  . fluticasone (FLONASE) 50 MCG/ACT nasal spray Place 1 spray into both nostrils daily as needed for allergies.     Marland Kitchen guaiFENesin-codeine (ROBITUSSIN AC) 100-10 MG/5ML syrup Take 10 mLs by mouth at bedtime as needed for cough. (Patient not taking: Reported on 07/11/2018) 118 mL 0  . HYDROcodone-acetaminophen (NORCO/VICODIN) 5-325 MG tablet Take 1 tablet by mouth every 6 (six) hours as needed for severe pain. (Patient not taking: Reported on 07/11/2018) 5 tablet 0  . Insulin Glargine (BASAGLAR KWIKPEN) 100 UNIT/ML SOPN Inject 0.08 mLs (8 Units total) into the skin daily. 3 mL 2  . Insulin Pen Needle (B-D UF III MINI PEN NEEDLES) 31G X 5 MM MISC 1 Stick by Does not apply route daily. 11.69, insulin requirement 30 each 3  . metoprolol succinate (TOPROL-XL) 100 MG 24 hr tablet Take 1 tablet (100 mg total) by mouth daily. Take with or immediately following a meal. 90 tablet 3  . ondansetron (ZOFRAN) 4 MG tablet Take 1 tablet (4 mg total) by mouth daily as needed for up to 10 doses for nausea or vomiting. 10 tablet 0  . pantoprazole (PROTONIX) 20 MG tablet Take 1 tablet by mouth once daily 90 tablet 1  . promethazine (PHENERGAN) 12.5 MG tablet Take 1 tablet (12.5 mg total) by mouth every 6 (six) hours as needed for nausea. 12 tablet 0  . sacubitril-valsartan (ENTRESTO) 49-51 MG Take 1 tablet by mouth 2 (two) times daily. 60 tablet 11  . sitaGLIPtin (JANUVIA) 100 MG tablet Take 1 tablet (100 mg total) by mouth daily. 90 tablet 0   No current  facility-administered medications on file prior to visit.   No Known Allergies  Assessment/Plan:  1. CHF medication management-  If BP still elevated consider increasing Entresto to 97/103 mg or adding spironolactone 12.5 mg daily. BMET today  entresto Thursday/friday

## 2019-02-11 ENCOUNTER — Telehealth: Payer: Self-pay | Admitting: Pharmacist

## 2019-02-11 ENCOUNTER — Other Ambulatory Visit: Payer: Self-pay | Admitting: Internal Medicine

## 2019-02-11 DIAGNOSIS — E785 Hyperlipidemia, unspecified: Secondary | ICD-10-CM

## 2019-02-11 LAB — BASIC METABOLIC PANEL
BUN/Creatinine Ratio: 14 (ref 9–23)
BUN: 10 mg/dL (ref 6–24)
CO2: 23 mmol/L (ref 20–29)
Calcium: 9.4 mg/dL (ref 8.7–10.2)
Chloride: 102 mmol/L (ref 96–106)
Creatinine, Ser: 0.73 mg/dL (ref 0.57–1.00)
GFR calc Af Amer: 110 mL/min/{1.73_m2} (ref >59)
GFR calc non Af Amer: 95 mL/min/{1.73_m2} (ref >59)
Glucose: 149 mg/dL — ABNORMAL HIGH (ref 65–99)
Potassium: 3.9 mmol/L (ref 3.5–5.2)
Sodium: 138 mmol/L (ref 134–144)

## 2019-02-11 MED ORDER — SACUBITRIL-VALSARTAN 97-103 MG PO TABS: 1.0000 | ORAL_TABLET | Freq: Two times a day (BID) | ORAL | 11 refills | Status: DC

## 2019-02-11 NOTE — Telephone Encounter (Addendum)
Scr and K are stable. Ideally should increase Entresto to 97-103 for HF optimization and since patients BP is still above 130/80.  Called and reviewed labs with patient. She is in agreement to increase Entresto to 97/103. Will take 2 tablets of 49/51 twice a day until she runs out. Will then start 97/103 1 tablet twice a day. Follow up in clinic on 2/2 for BP check and repeat BMP

## 2019-02-20 ENCOUNTER — Ambulatory Visit: Payer: BC Managed Care – PPO | Attending: Internal Medicine

## 2019-02-20 DIAGNOSIS — Z20822 Contact with and (suspected) exposure to covid-19: Secondary | ICD-10-CM

## 2019-02-21 LAB — NOVEL CORONAVIRUS, NAA: SARS-CoV-2, NAA: NOT DETECTED

## 2019-02-24 ENCOUNTER — Ambulatory Visit: Payer: BC Managed Care – PPO

## 2019-03-18 ENCOUNTER — Other Ambulatory Visit: Payer: Self-pay

## 2019-03-18 ENCOUNTER — Ambulatory Visit (INDEPENDENT_AMBULATORY_CARE_PROVIDER_SITE_OTHER): Payer: BC Managed Care – PPO | Admitting: Pharmacist

## 2019-03-18 VITALS — BP 142/92 | HR 80

## 2019-03-18 DIAGNOSIS — I5022 Chronic systolic (congestive) heart failure: Secondary | ICD-10-CM | POA: Diagnosis not present

## 2019-03-18 MED ORDER — METOPROLOL SUCCINATE ER 200 MG PO TB24: 200.0000 mg | ORAL_TABLET | Freq: Every day | ORAL | 3 refills | Status: DC

## 2019-03-18 NOTE — Patient Instructions (Addendum)
It was nice to see you today  Increase your metoprolol from 1 tablet to 1 and 1/2 tablets for the next 3-4 days. If you feel ok, then increase to 2 tablets daily. I sent in a refill for a higher dose, when you use up your current supply, pick up your new prescription and go back to taking 1 tablet once daily (this will be a total of 200mg  each day)  Continue taking your other medications and monitoring your blood pressure  I'll give you a call in a week to see how you are feeling and how your blood pressure is looking. If we can, I'd like to start a low dose of a medicine called spironolactone to help your heart pump better and to lower your blood pressure a bit more. We can schedule follow up at that time  Call [facility], Pharmacist with any concerns [phone]

## 2019-03-18 NOTE — Progress Notes (Signed)
Patient ID: Sandra [name]                 DOB: 27-Sep-1965                      MRN: [id number]     HPI: Sandra [name] is a 54 y.o. female referred by Dr. Johnsie Cancel to pharmacy clinic for HF medication optimization. PMH is significant for HTN, DM, HLD, HFrEF with LVEF 30-35% on 11/20 echo. Pt was advised to stop quinapril and start Entresto after most recent echo. At last visit with PharmD on 02/10/19, Entresto was increased to 97-103mg  BID dosing. Copay is affordable using copay card. Propranolol was also changed to metoprolol succinate for CHF benefit.  The patient presents in clinic today in good spirits.She has purchased a BP cuff since her last visit. Home BP readings range 125/84 - 144/91, about 75% of readings are above goal. HR in the 90s consistently at home. She has been trying to walk a bit more and park farther away from stores in the parking lot. She has also been working to cut back on [employer]. She reports tolerating her medications well, including higher dose of Entresto.  Current HTN/HF meds: Entresto 97-103mg  BID, metoprolol succinate 100 mg daily, Synjardy (empagliflozin/metformin) 05/998 mg twice a day Previously tried: quinapril (switched to Entresto) BP goal: <130/41mmHg  Family History: DM and HTN in her father.  Social History: denies tobacco, alcohol, and illicit drug use.  Diet:  Eats fried food three times a week. Eats takeout 3-4 times a week. Likes shakes with spinach. Likes seafood and [employer] when she eats out. Doesn't add salt to food. Drinks water, limited caffeine.  Exercise: Takes a 15 min walk 1-2 times a week.  Home BP readings: 136/96, 129/91, 144/91, 150/94, 148/92, 148/87, 125/84, 134/79, HR 90s  Wt Readings from Last 3 Encounters:  12/04/18 201 lb 9.6 oz (91.4 kg)  11/17/18 204 lb (92.5 kg)  11/05/18 195 lb 4.8 oz (88.6 kg)   BP Readings from Last 3 Encounters:  02/10/19 (!) 142/78  01/14/19 (!) 142/84  12/04/18 127/74   Pulse  Readings from Last 3 Encounters:  02/10/19 92  01/14/19 92  12/04/18 96    Renal function: CrCl cannot be calculated (Patient's most recent lab result is older than the maximum 21 days allowed.).  Past Medical History:  Diagnosis Date  . Acute pain of left shoulder 06/07/2017  . Asthma    as a child  . 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 Johnsie Cancel note 04/2017  . Carpal tunnel syndrome   . Chest pain 07/11/2018  . Diabetes mellitus type 2, uncontrolled (HCC) DX: 1999   Started as gestational diabetes  . ESSENTIAL HYPERTENSION 07/08/2006  . Gallstones   . HSV (herpes simplex virus) anogenital infection   . HYPERLIPIDEMIA 08/20/2007  . LBBB (left bundle branch block)   . Menorrhagia   . Migraine    Normal CT head (08/06/2006)  . Swelling of abdominal wall 03/02/2014  . Swelling of abdominal wall 03/02/2014  . Symptoms of upper respiratory infection (URI) 04/01/2018  . Viral URI with cough 01/13/2013    Current Outpatient Medications on File Prior to Visit  Medication Sig Dispense Refill  . acetaminophen (TYLENOL) 500 MG tablet Take 1,000 mg by mouth every 6 (six) hours as needed for moderate pain or headache.    . albuterol (PROVENTIL HFA;VENTOLIN HFA) 108 (90 Base) MCG/ACT inhaler Inhale 1-2 puffs  into the lungs every 6 (six) hours as needed for wheezing or shortness of breath. 1 Inhaler 0  . atorvastatin (LIPITOR) 40 MG tablet Take 1 tablet by mouth once daily 90 tablet 0  . BIOTIN PO Take 500 mg by mouth daily.    Marland Kitchen CINNAMON PO Take 1,000 mg by mouth 2 (two) times daily.    Marland Kitchen dextromethorphan-guaiFENesin (MUCINEX DM) 30-600 MG 12hr tablet Take 1 tablet 2 (two) times daily by mouth. (Patient not taking: Reported on 07/11/2018) 10 tablet 0  . diphenhydrAMINE (BENADRYL) 25 MG tablet Take 2 tablets (50 mg total) by mouth every 8 (eight) hours as needed for up to 14 days (Nausea). 30 tablet 0  . eletriptan (RELPAX) 20 MG tablet TAKE 1 TABLET BY MOUTH AS NEEDED FOR   MIGRAINE  OR  HEADACHE.  MAY  REPEAT  IN  2  HOURS  IF  HEADACHE  PERSISTS  OR  RECURS (Patient taking differently: Take 20 mg by mouth every 2 (two) hours as needed for headache. ) 10 tablet 0  . Empagliflozin-metFORMIN HCl ER (SYNJARDY XR) 05-998 MG TB24 Take 1 tablet by mouth 2 (two) times daily with a meal. 60 tablet 11  . fluticasone (FLONASE) 50 MCG/ACT nasal spray Place 1 spray into both nostrils daily as needed for allergies.     Marland Kitchen guaiFENesin-codeine (ROBITUSSIN AC) 100-10 MG/5ML syrup Take 10 mLs by mouth at bedtime as needed for cough. (Patient not taking: Reported on 07/11/2018) 118 mL 0  . HYDROcodone-acetaminophen (NORCO/VICODIN) 5-325 MG tablet Take 1 tablet by mouth every 6 (six) hours as needed for severe pain. (Patient not taking: Reported on 07/11/2018) 5 tablet 0  . Insulin Glargine (BASAGLAR KWIKPEN) 100 UNIT/ML SOPN Inject 0.08 mLs (8 Units total) into the skin daily. 3 mL 2  . Insulin Pen Needle (B-D UF III MINI PEN NEEDLES) 31G X 5 MM MISC 1 Stick by Does not apply route daily. 11.69, insulin requirement 30 each 3  . metoprolol succinate (TOPROL-XL) 100 MG 24 hr tablet Take 1 tablet (100 mg total) by mouth daily. Take with or immediately following a meal. 90 tablet 3  . ondansetron (ZOFRAN) 4 MG tablet Take 1 tablet (4 mg total) by mouth daily as needed for up to 10 doses for nausea or vomiting. 10 tablet 0  . pantoprazole (PROTONIX) 20 MG tablet Take 1 tablet by mouth once daily 90 tablet 1  . promethazine (PHENERGAN) 12.5 MG tablet Take 1 tablet (12.5 mg total) by mouth every 6 (six) hours as needed for nausea. 12 tablet 0  . sacubitril-valsartan (ENTRESTO) 97-103 MG Take 1 tablet by mouth 2 (two) times daily. 60 tablet 11  . sitaGLIPtin (JANUVIA) 100 MG tablet Take 1 tablet (100 mg total) by mouth daily. 90 tablet 0   No current facility-administered medications on file prior to visit.    No Known Allergies   Assessment/Plan:  1. HTN/CHF medication management - BP  remains elevated above goal <130/28mmHg. Will increase Toprol to 150mg  daily over the next few days. If she tolerates this well, advised her to increase dose to 200mg  daily (goal HR 60). Will continue Entresto 97-103mg  BID and check BMET today with recent dose increase. She will also continue Synjardy which contains empagliflozin that has BP/CHF benefit. Will call pt in 1 week to assess tolerability to higher dose of Toprol. If tolerating well and BMET from today is stable, will plan to start spironolactone for CHF and BP benefit. Will schedule BP and lab  f/u at that time.  Megan E. Supple, PharmD, BCACP, [place] [id number], [place], [place] [address] Phone: [phone]; Fax: [phone] 03/18/2019 1:47 PM

## 2019-03-19 LAB — BASIC METABOLIC PANEL
BUN/Creatinine Ratio: 12 (ref 9–23)
BUN: 9 mg/dL (ref 6–24)
CO2: 24 mmol/L (ref 20–29)
Calcium: 9.6 mg/dL (ref 8.7–10.2)
Chloride: 102 mmol/L (ref 96–106)
Creatinine, Ser: 0.74 mg/dL (ref 0.57–1.00)
GFR calc Af Amer: 108 mL/min/{1.73_m2} (ref >59)
GFR calc non Af Amer: 93 mL/min/{1.73_m2} (ref >59)
Glucose: 117 mg/dL — ABNORMAL HIGH (ref 65–99)
Potassium: 3.9 mmol/L (ref 3.5–5.2)
Sodium: 140 mmol/L (ref 134–144)

## 2019-03-23 ENCOUNTER — Other Ambulatory Visit: Payer: Self-pay | Admitting: Internal Medicine

## 2019-03-23 DIAGNOSIS — E1169 Type 2 diabetes mellitus with other specified complication: Secondary | ICD-10-CM

## 2019-03-24 ENCOUNTER — Telehealth: Payer: Self-pay | Admitting: Pharmacist

## 2019-03-24 NOTE — Telephone Encounter (Signed)
Called pt to follow up - she increased to metoprolol 150mg  daily the day after her visit last week and has been taking this for the past week. Reports tolerating dose increase ok. Has been out of work with a tooth ache and has been in some pain because of this. Home BP: 135/94, 134/84, HR: 103, 94.  Advised pt to increase metoprolol to 200mg  once daily and continue other meds. She will keep an eye on her BP at home. Will call pt in another week to see how she is feeling and how BP readings are looking. Ideally wish to start low dose spironolactone as well for CHF benefit.

## 2019-03-31 ENCOUNTER — Telehealth: Payer: Self-pay | Admitting: Pharmacist

## 2019-03-31 NOTE — Telephone Encounter (Signed)
Pt called clinic, felt like she was having an MI last night. States it felt like heartburn so she took heartburn medication and her symptoms resolved. She is still getting used to the higher dose of metoprolol. Reports normal systolic BP of [id number], high of 156.  Will give pt another week on higher dose of 200mg  of Toprol to see if she tolerates it a bit better. Will call in 1 week and plan to add low dose spironolactone for CHF benefit if BP allows. Advised pt that if heartburn meds do not resolve symptoms in the future and she is feeling like she is having an MI that she should call 911. She verbalized understanding.

## 2019-04-07 ENCOUNTER — Telehealth: Payer: Self-pay | Admitting: Pharmacist

## 2019-04-07 DIAGNOSIS — I10 Essential (primary) hypertension: Secondary | ICD-10-CM

## 2019-04-07 MED ORDER — SPIRONOLACTONE 25 MG PO TABS: 12.5000 mg | ORAL_TABLET | Freq: Every day | ORAL | 11 refills | Status: DC

## 2019-04-07 NOTE — Telephone Encounter (Signed)
Called pt to follow up. She reports still feeling a bit fatigued on higher dose of Toprol, although HR has remained in the mid 90s. BP has ranged 130/88-141/88. She is willing to continue on Toprol 200mg  daily after discussing its benefit. Will also start spironolactone 12.5mg  daily. Scheduled f/u BP check and BMET in 1 week.

## 2019-04-15 ENCOUNTER — Ambulatory Visit (INDEPENDENT_AMBULATORY_CARE_PROVIDER_SITE_OTHER): Payer: BC Managed Care – PPO | Admitting: Pharmacist

## 2019-04-15 ENCOUNTER — Other Ambulatory Visit: Payer: Self-pay

## 2019-04-15 ENCOUNTER — Other Ambulatory Visit: Payer: BC Managed Care – PPO | Admitting: *Deleted

## 2019-04-15 VITALS — BP 130/75 | HR 84

## 2019-04-15 DIAGNOSIS — I10 Essential (primary) hypertension: Secondary | ICD-10-CM

## 2019-04-15 DIAGNOSIS — I5022 Chronic systolic (congestive) heart failure: Secondary | ICD-10-CM | POA: Diagnosis not present

## 2019-04-15 NOTE — Patient Instructions (Addendum)
It was great seeing you today!  Your blood pressure is close to your goal of <130/80. Continue taking Entresto 97/103mg  twice daily, Synjardy 5/1000mg  twice daily, metoprolol succinate 200mg  daily, and spironolactone 12.5mg  daily.  We will call you with your lab results tomorrow and make any changes at that time.

## 2019-04-15 NOTE — Progress Notes (Signed)
Patient ID: Sandra [name]                 DOB: 03/11/1965                      MRN: [id number]     HPI: Sandra [name] is a 54 y.o. female referred by Dr. Johnsie Cancel to pharmacy clinic for HF medication optimization. PMH is significant for HTN, DM, HLD, HFrEF with LVEF 30-35% on 11/20 echo. Pt was advised to stop quinapril and start Entresto after most recent echo and propranolol was changed to metoprolol succinate both for CHF benefit. Entresto copay is affordable using copay card. Patient purchased a home BP cuff.   At last visit 03/18/19, 75% of home BP readings were still above goal, HR 90's. Metoprolol succinate was increased to 150mg  daily. A follow up phone call revealed patient was tolerating this increase well so metoprolol succinate was increased to 200mg  daily. A phone call a week following, patient endorsed feeling fatigued but was willing to continue on this higher dose. Spironolactone 12.5mg  daily was started at this time. Patient also endorsed symptoms mimicking an MI but that were relieved with heartburn medication.  Patient presents to clinic today for follow up of her hypertension and heart failure medications and BMET. Patient endorses continued fatigue while on the higher dose of metoprolol however it may be due to getting tired at work. Patient had a picture of home blood pressure readings and HR which averaged 130-140s/80's-90s, HR 80's-90's. Patient stated she usually takes her blood pressure standing up without regard to when she has taken her medications. BP in clinic 130/75, HR 84. Patient endorses walking more by parking farther away and cutting back on fried and fast foods. Sandra [name] states she has not had any more heartburn since her last call.   Current HTN/HF meds: Entresto 97-103mg  BID, metoprolol succinate 200 mg daily, Synjardy (empagliflozin/metformin) 05/998 mg twice a day, spironolactone 12.5mg  daily Previously tried: quinapril (switched to Entresto) BP goal:  <130/75mmHg  Family History: DM and HTN in her father.  Social History: denies tobacco, alcohol, and illicit drug use  Diet:  Eats fried food three times a week. Eats takeout 3-4 times a week. Likes shakes with spinach. Likes seafood and [employer] when she eats out. Doesn't add salt to food. Drinks water, limited caffeine.  Exercise: Takes a 15 min walk 1-2 times a week.   Home BP readings: 130-140's/80-90's, HR 80-90's  Wt Readings from Last 3 Encounters:  12/04/18 201 lb 9.6 oz (91.4 kg)  11/17/18 204 lb (92.5 kg)  11/05/18 195 lb 4.8 oz (88.6 kg)   BP Readings from Last 3 Encounters:  03/18/19 (!) 142/92  02/10/19 (!) 142/78  01/14/19 (!) 142/84   Pulse Readings from Last 3 Encounters:  03/18/19 80  02/10/19 92  01/14/19 92    Renal function: CrCl cannot be calculated (Patient's most recent lab result is older than the maximum 21 days allowed.).  Past Medical History:  Diagnosis Date  . Acute pain of left shoulder 06/07/2017  . Asthma    as a child  . 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 Johnsie Cancel note 04/2017  . Carpal tunnel syndrome   . Chest pain 07/11/2018  . Diabetes mellitus type 2, uncontrolled (HCC) DX: 1999   Started as gestational diabetes  . ESSENTIAL HYPERTENSION 07/08/2006  . Gallstones   . HSV (herpes simplex virus) anogenital infection   . HYPERLIPIDEMIA 08/20/2007  .  LBBB (left bundle branch block)   . Menorrhagia   . Migraine    Normal CT head (08/06/2006)  . Swelling of abdominal wall 03/02/2014  . Swelling of abdominal wall 03/02/2014  . Symptoms of upper respiratory infection (URI) 04/01/2018  . Viral URI with cough 01/13/2013    Current Outpatient Medications on File Prior to Visit  Medication Sig Dispense Refill  . acetaminophen (TYLENOL) 500 MG tablet Take 1,000 mg by mouth every 6 (six) hours as needed for moderate pain or headache.    . albuterol (PROVENTIL HFA;VENTOLIN HFA) 108 (90 Base) MCG/ACT inhaler Inhale 1-2 puffs  into the lungs every 6 (six) hours as needed for wheezing or shortness of breath. 1 Inhaler 0  . atorvastatin (LIPITOR) 40 MG tablet Take 1 tablet by mouth once daily 90 tablet 0  . BIOTIN PO Take 500 mg by mouth daily.    Marland Kitchen CINNAMON PO Take 1,000 mg by mouth 2 (two) times daily.    Marland Kitchen dextromethorphan-guaiFENesin (MUCINEX DM) 30-600 MG 12hr tablet Take 1 tablet 2 (two) times daily by mouth. (Patient not taking: Reported on 07/11/2018) 10 tablet 0  . diphenhydrAMINE (BENADRYL) 25 MG tablet Take 2 tablets (50 mg total) by mouth every 8 (eight) hours as needed for up to 14 days (Nausea). 30 tablet 0  . eletriptan (RELPAX) 20 MG tablet TAKE 1 TABLET BY MOUTH AS NEEDED FOR  MIGRAINE  OR  HEADACHE.  MAY  REPEAT  IN  2  HOURS  IF  HEADACHE  PERSISTS  OR  RECURS (Patient taking differently: Take 20 mg by mouth every 2 (two) hours as needed for headache. ) 10 tablet 0  . Empagliflozin-metFORMIN HCl ER (SYNJARDY XR) 05-998 MG TB24 Take 1 tablet by mouth 2 (two) times daily with a meal. 60 tablet 11  . fluticasone (FLONASE) 50 MCG/ACT nasal spray Place 1 spray into both nostrils daily as needed for allergies.     Marland Kitchen guaiFENesin-codeine (ROBITUSSIN AC) 100-10 MG/5ML syrup Take 10 mLs by mouth at bedtime as needed for cough. (Patient not taking: Reported on 07/11/2018) 118 mL 0  . HYDROcodone-acetaminophen (NORCO/VICODIN) 5-325 MG tablet Take 1 tablet by mouth every 6 (six) hours as needed for severe pain. (Patient not taking: Reported on 07/11/2018) 5 tablet 0  . Insulin Glargine (BASAGLAR KWIKPEN) 100 UNIT/ML SOPN Inject 0.08 mLs (8 Units total) into the skin daily. 3 mL 2  . Insulin Pen Needle (B-D UF III MINI PEN NEEDLES) 31G X 5 MM MISC 1 Stick by Does not apply route daily. 11.69, insulin requirement 30 each 3  . JANUVIA 100 MG tablet Take 1 tablet by mouth once daily 90 tablet 0  . metoprolol succinate (TOPROL-XL) 200 MG 24 hr tablet Take 1 tablet (200 mg total) by mouth daily. Take with or immediately following  a meal. 90 tablet 3  . ondansetron (ZOFRAN) 4 MG tablet Take 1 tablet (4 mg total) by mouth daily as needed for up to 10 doses for nausea or vomiting. 10 tablet 0  . pantoprazole (PROTONIX) 20 MG tablet Take 1 tablet by mouth once daily 90 tablet 1  . promethazine (PHENERGAN) 12.5 MG tablet Take 1 tablet (12.5 mg total) by mouth every 6 (six) hours as needed for nausea. 12 tablet 0  . sacubitril-valsartan (ENTRESTO) 97-103 MG Take 1 tablet by mouth 2 (two) times daily. 60 tablet 11  . spironolactone (ALDACTONE) 25 MG tablet Take 0.5 tablets (12.5 mg total) by mouth daily. 15 tablet 11  No current facility-administered medications on file prior to visit.    No Known Allergies   Assessment/Plan:  1. HTN/CHF medication management - Based on BP goal <130/80, patient is still slightly above goal. Patient is on max dose of Entresto and metoprolol succinate. Based on BMET results today, we may increase spironolactone dose to 25mg  daily. For today, patient to continue Entresto 97/103mg  twice daily, Synjardy 5/1000mg  twice daily, metoprolol succinate 200mg  daily, and spironolactone 12.5mg  daily. We will follow up with BMET results tomorrow and follow up accordingly. Patient encouraged to take her blood pressure 1-2 hours after taking her medications, sitting down, and after resting for 5 minutes.   Ladoris Gene, PharmD Candidate  Megan E. Supple, PharmD, BCACP, [place] [id number] [address] [place], [place] [address] Phone: [phone]; Fax: [phone] 04/15/2019 4:07 PM

## 2019-04-16 ENCOUNTER — Telehealth: Payer: Self-pay

## 2019-04-16 DIAGNOSIS — I10 Essential (primary) hypertension: Secondary | ICD-10-CM

## 2019-04-16 LAB — BASIC METABOLIC PANEL
BUN/Creatinine Ratio: 13 (ref 9–23)
BUN: 10 mg/dL (ref 6–24)
CO2: 21 mmol/L (ref 20–29)
Calcium: 10 mg/dL (ref 8.7–10.2)
Chloride: 102 mmol/L (ref 96–106)
Creatinine, Ser: 0.76 mg/dL (ref 0.57–1.00)
GFR calc Af Amer: 104 mL/min/{1.73_m2} (ref >59)
GFR calc non Af Amer: 90 mL/min/{1.73_m2} (ref >59)
Glucose: 123 mg/dL — ABNORMAL HIGH (ref 65–99)
Potassium: 4.5 mmol/L (ref 3.5–5.2)
Sodium: 139 mmol/L (ref 134–144)

## 2019-04-16 NOTE — Telephone Encounter (Signed)
Spoke to Sandra [name] over the phone regarding her BMET results. Her Scr and K are normal so we will increase her spironolactone to 25mg  daily. A new prescription has been sent to her pharmacy and she is scheduled for follow up BMET in 1 week. We will follow up with these results over the phone.

## 2019-04-20 ENCOUNTER — Telehealth: Payer: Self-pay

## 2019-04-20 MED ORDER — SPIRONOLACTONE 25 MG PO TABS: 25.0000 mg | ORAL_TABLET | Freq: Every day | ORAL | 11 refills | Status: DC

## 2019-04-20 NOTE — Telephone Encounter (Signed)
Already spoke with pt - needed new spiro rx sent in which has been done.

## 2019-04-20 NOTE — Addendum Note (Signed)
Addended by: Cesareo Vickrey E on: 04/20/2019 10:57 AM   Modules accepted: Orders

## 2019-04-23 ENCOUNTER — Ambulatory Visit: Payer: BC Managed Care – PPO

## 2019-04-23 ENCOUNTER — Other Ambulatory Visit: Payer: BC Managed Care – PPO

## 2019-04-24 ENCOUNTER — Other Ambulatory Visit: Payer: BC Managed Care – PPO | Admitting: *Deleted

## 2019-04-24 ENCOUNTER — Other Ambulatory Visit: Payer: Self-pay

## 2019-04-24 DIAGNOSIS — I10 Essential (primary) hypertension: Secondary | ICD-10-CM

## 2019-04-24 LAB — BASIC METABOLIC PANEL
BUN/Creatinine Ratio: 17 (ref 9–23)
BUN: 15 mg/dL (ref 6–24)
CO2: 24 mmol/L (ref 20–29)
Calcium: 9.4 mg/dL (ref 8.7–10.2)
Chloride: 103 mmol/L (ref 96–106)
Creatinine, Ser: 0.87 mg/dL (ref 0.57–1.00)
GFR calc Af Amer: 89 mL/min/{1.73_m2} (ref >59)
GFR calc non Af Amer: 77 mL/min/{1.73_m2} (ref >59)
Glucose: 160 mg/dL — ABNORMAL HIGH (ref 65–99)
Potassium: 4.6 mmol/L (ref 3.5–5.2)
Sodium: 139 mmol/L (ref 134–144)

## 2019-05-10 ENCOUNTER — Other Ambulatory Visit: Payer: Self-pay | Admitting: Internal Medicine

## 2019-05-18 ENCOUNTER — Ambulatory Visit: Payer: BC Managed Care – PPO | Attending: Internal Medicine

## 2019-05-18 DIAGNOSIS — Z20822 Contact with and (suspected) exposure to covid-19: Secondary | ICD-10-CM

## 2019-05-19 LAB — NOVEL CORONAVIRUS, NAA: SARS-CoV-2, NAA: NOT DETECTED

## 2019-05-19 LAB — SARS-COV-2, NAA 2 DAY TAT

## 2019-05-22 ENCOUNTER — Encounter: Payer: Self-pay | Admitting: *Deleted

## 2019-05-28 ENCOUNTER — Other Ambulatory Visit: Payer: Self-pay | Admitting: Internal Medicine

## 2019-05-28 DIAGNOSIS — E785 Hyperlipidemia, unspecified: Secondary | ICD-10-CM

## 2019-06-04 LAB — HM DIABETES EYE EXAM

## 2019-06-30 ENCOUNTER — Other Ambulatory Visit: Payer: Self-pay | Admitting: Internal Medicine

## 2019-06-30 DIAGNOSIS — E1169 Type 2 diabetes mellitus with other specified complication: Secondary | ICD-10-CM

## 2019-07-03 ENCOUNTER — Telehealth: Payer: Self-pay | Admitting: Cardiovascular Disease

## 2019-07-03 ENCOUNTER — Encounter (HOSPITAL_COMMUNITY): Payer: Self-pay | Admitting: Emergency Medicine

## 2019-07-03 ENCOUNTER — Emergency Department (HOSPITAL_COMMUNITY)
Admission: EM | Admit: 2019-07-03 | Discharge: 2019-07-03 | Disposition: A | Discharge status: Home / Self Care | Payer: BC Managed Care – PPO | Attending: Emergency Medicine | Admitting: Emergency Medicine

## 2019-07-03 ENCOUNTER — Other Ambulatory Visit: Payer: Self-pay

## 2019-07-03 ENCOUNTER — Emergency Department (HOSPITAL_COMMUNITY): Payer: BC Managed Care – PPO

## 2019-07-03 DIAGNOSIS — Z7982 Long term (current) use of aspirin: Secondary | ICD-10-CM | POA: Insufficient documentation

## 2019-07-03 DIAGNOSIS — I110 Hypertensive heart disease with heart failure: Secondary | ICD-10-CM | POA: Insufficient documentation

## 2019-07-03 DIAGNOSIS — E119 Type 2 diabetes mellitus without complications: Secondary | ICD-10-CM | POA: Diagnosis not present

## 2019-07-03 DIAGNOSIS — Z794 Long term (current) use of insulin: Secondary | ICD-10-CM | POA: Diagnosis not present

## 2019-07-03 DIAGNOSIS — R079 Chest pain, unspecified: Secondary | ICD-10-CM | POA: Diagnosis not present

## 2019-07-03 DIAGNOSIS — R0789 Other chest pain: Secondary | ICD-10-CM | POA: Diagnosis present

## 2019-07-03 DIAGNOSIS — Z79899 Other long term (current) drug therapy: Secondary | ICD-10-CM | POA: Insufficient documentation

## 2019-07-03 DIAGNOSIS — I5022 Chronic systolic (congestive) heart failure: Secondary | ICD-10-CM | POA: Insufficient documentation

## 2019-07-03 LAB — CBC
HCT: 38.6 % (ref 36.0–46.0)
Hemoglobin: 12.5 g/dL (ref 12.0–15.0)
MCH: 32.8 pg (ref 26.0–34.0)
MCHC: 32.4 g/dL (ref 30.0–36.0)
MCV: 101.3 fL — ABNORMAL HIGH (ref 80.0–100.0)
Platelets: 318 10*3/uL (ref 150–400)
RBC: 3.81 MIL/uL — ABNORMAL LOW (ref 3.87–5.11)
RDW: 12.3 % (ref 11.5–15.5)
WBC: 6.3 10*3/uL (ref 4.0–10.5)
nRBC: 0 % (ref 0.0–0.2)

## 2019-07-03 LAB — BASIC METABOLIC PANEL
Anion gap: 11 (ref 5–15)
BUN: 14 mg/dL (ref 6–20)
CO2: 23 mmol/L (ref 22–32)
Calcium: 9.2 mg/dL (ref 8.9–10.3)
Chloride: 105 mmol/L (ref 98–111)
Creatinine, Ser: 0.81 mg/dL (ref 0.44–1.00)
GFR calc Af Amer: >60 mL/min (ref >60)
GFR calc non Af Amer: >60 mL/min (ref >60)
Glucose, Bld: 134 mg/dL — ABNORMAL HIGH (ref 70–99)
Potassium: 4 mmol/L (ref 3.5–5.1)
Sodium: 139 mmol/L (ref 135–145)

## 2019-07-03 LAB — TROPONIN I (HIGH SENSITIVITY)
Troponin I (High Sensitivity): 6 ng/L (ref <18)
Troponin I (High Sensitivity): 7 ng/L (ref <18)

## 2019-07-03 LAB — BRAIN NATRIURETIC PEPTIDE: B Natriuretic Peptide: 33 pg/mL (ref 0.0–100.0)

## 2019-07-03 LAB — I-STAT BETA HCG BLOOD, ED (MC, WL, AP ONLY): I-stat hCG, quantitative: <5 m[IU]/mL (ref <5)

## 2019-07-03 MED ORDER — IBUPROFEN 400 MG PO TABS
600.0000 mg | ORAL_TABLET | Freq: Once | ORAL | Status: AC
  Administered 2019-07-03: 600 mg via ORAL
  Filled 2019-07-03: qty 1

## 2019-07-03 MED ORDER — KETOROLAC TROMETHAMINE 60 MG/2ML IM SOLN
60.0000 mg | Freq: Once | INTRAMUSCULAR | Status: DC
  Filled 2019-07-03: qty 2

## 2019-07-03 MED ORDER — ASPIRIN 81 MG PO CHEW
243.0000 mg | CHEWABLE_TABLET | Freq: Once | ORAL | Status: AC
  Administered 2019-07-03: 243 mg via ORAL
  Filled 2019-07-03: qty 3

## 2019-07-03 MED ORDER — SODIUM CHLORIDE 0.9% FLUSH: 3.0000 mL | Freq: Once | INTRAVENOUS | Status: DC

## 2019-07-03 MED ORDER — METHOCARBAMOL 500 MG PO TABS
500.0000 mg | ORAL_TABLET | Freq: Two times a day (BID) | ORAL | 0 refills | Status: DC
Start: 2019-07-03 — End: 2019-09-21

## 2019-07-03 NOTE — Consult Note (Addendum)
Cardiology Consultation:   Patient ID: [name] MRN: [id number]; DOB: 27-Apr-1965  Admit date: 07/03/2019 Date of Consult: 07/03/2019  Primary Care Provider: [name], MD [facility] Cardiologist: Jenkins Rouge, MD  [facility] Electrophysiologist:  None    Patient Profile:   Sandra [name] is a 54 y.o. female with a hx of DM, HLD, HTN first seen 04/26/17 for dyspnea. 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  History of Present Illness:   Ms. Sybert with above hx and last seen by Dr. Johnsie Cancel 10/2018.  In 2014 she had cardiac MRI with mild LVE, with septal and anterior wall hypokinesis, EF 46%, no hyper-enhancement or scar tissue, mild LAE, normal Rt sided cardiac chambers.    She had echo 11/28/2018 with decrease in EF from 40-45%2019 to 30-35%. Abnormal (paradoxical) septal motion, consistent with left bundle branch block. G2DD, LA size was mildly dilated.  RV normal.  With drop in EF her ACE was changed to [facility]. She was on BB as well.  Last pharm visit in our office her aldactone was 12.5 daily entresto 97/103 daily toprol XL 200 mg daily, and DM synjardy (empagliflozin/metformin ) 5/100.  Yesterday she had some midsternal pain that went to her back and into Rt arm.  It went away but today at work pain returned and she had + diaphoresis, no N,V, or SOB.  Described as pressure to sharp in chest.  Her boss made her come to ER. She has rec'd ASA here and no further pain. This does not feel like her GERD.    She has not had COVID and has not had vaccine.  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 She does have carpal tunnel so sleeps sitting up so it does not bother her. No SOB, no edema  EKG:  The EKG was personally  reviewed and demonstrates:  SR with chronic LBBB Telemetry:  Telemetry was personally reviewed and demonstrates:  SR   Na 139, K+ 4.0, BUN 14, Cr 0.81  BNP 33 Hs Troponin 6,7 Hgb 12.5 WBC 6.3 plts 318 Neg preg.  CXR  2V No active cardiopulmonary disease   BP 129/66 on arrival and stable.   Past Medical History:  Diagnosis Date  . Acute pain of left shoulder 06/07/2017  . Asthma    as a child  . 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 Johnsie Cancel note 04/2017  . Carpal tunnel syndrome   . Chest pain 07/11/2018  . Diabetes mellitus type 2, uncontrolled (HCC) DX: 1999   Started as gestational diabetes  . ESSENTIAL HYPERTENSION 07/08/2006  . Gallstones   . HSV (herpes simplex virus) anogenital infection   . HYPERLIPIDEMIA 08/20/2007  . LBBB (left bundle branch block)   . Menorrhagia   . Migraine    Normal CT head (08/06/2006)  . Swelling of abdominal wall 03/02/2014  . Swelling of abdominal wall 03/02/2014  . Symptoms of upper respiratory infection (URI) 04/01/2018  . Viral URI with cough 01/13/2013    Past Surgical History:  Procedure Laterality Date  . CHOLECYSTECTOMY  2009  . DILATATION & CURETTAGE/HYSTEROSCOPY WITH MYOSURE N/A 12/25/2017   Procedure: DILATATION & CURETTAGE/HYSTEROSCOPY   POLYPECTOMY WITH MYOSURE;  Surgeon: Murrell Redden,  Earlyne Iba, MD;  Location: [facility];  Service: Gynecology;  Laterality: N/A;  . TUBAL LIGATION       Home Medications:  Prior to Admission medications   Medication Sig Start Date End Date Taking? Authorizing Provider  albuterol (PROVENTIL HFA;VENTOLIN HFA) 108 (90 Base) MCG/ACT inhaler Inhale 1-2 puffs into the lungs every 6 (six) hours as needed for wheezing or shortness of breath. 03/21/17  Yes Ledell Noss, MD  aspirin EC 81 MG tablet Take 81 mg by mouth daily. Swallow whole.   Yes [provider]  atorvastatin (LIPITOR) 40 MG tablet Take 1 tablet by mouth once daily Patient taking differently: Take 40 mg by mouth daily.  05/28/19  Yes  Aldine Contes, MD  BIOTIN PO Take 500 mg by mouth daily.   Yes [provider]  CINNAMON PO Take 1,000 mg by mouth 2 (two) times daily.   Yes [provider]  eletriptan (RELPAX) 20 MG tablet TAKE 1 TABLET BY MOUTH AS NEEDED FOR  MIGRAINE  OR  HEADACHE.  MAY  REPEAT  IN  2  HOURS  IF  HEADACHE  PERSISTS  OR  RECURS Patient taking differently: Take 20 mg by mouth as needed for migraine or headache (MAY REPEAT IN 2 HOURS, IF NO RELIEF).  04/30/18  Yes [name], MD  Empagliflozin-metFORMIN HCl ER (SYNJARDY XR) 05-998 MG TB24 Take 1 tablet by mouth 2 (two) times daily with a meal. 08/11/18  Yes [name], MD  fluticasone [facility]) 50 MCG/ACT nasal spray Place 1 spray into both nostrils daily as needed for allergies.    Yes [provider]  ibuprofen (ADVIL) 200 MG tablet Take 400-600 mg by mouth every 6 (six) hours as needed.   Yes [provider]  Insulin Glargine (BASAGLAR KWIKPEN) 100 UNIT/ML SOPN Inject 0.08 mLs (8 Units total) into the skin daily. Patient taking differently: Inject 8 Units into the skin at bedtime.  12/04/18  Yes [name], MD  JANUVIA 100 MG tablet Take 1 tablet by mouth once daily Patient taking differently: Take 100 mg by mouth at bedtime.  06/30/19  Yes [name], MD  metoprolol succinate (TOPROL-XL) 200 MG 24 hr tablet Take 1 tablet (200 mg total) by mouth daily. Take with or immediately following a meal. 03/18/19 03/12/20 Yes Josue Hector, MD  ondansetron (ZOFRAN) 4 MG tablet Take 1 tablet (4 mg total) by mouth daily as needed for up to 10 doses for nausea or vomiting. 11/05/18  Yes [name], MD  pantoprazole (PROTONIX) 20 MG tablet Take 1 tablet by mouth once daily Patient taking differently: Take 20 mg by mouth daily before breakfast.  05/12/19  Yes [name], MD  promethazine (PHENERGAN) 12.5 MG tablet Take 1 tablet (12.5 mg total) by mouth every 6 (six) hours as needed for nausea. 02/24/16   Yes Lenore Cordia, MD  sacubitril-valsartan (ENTRESTO) 97-103 MG Take 1 tablet by mouth 2 (two) times daily. 02/11/19  Yes Josue Hector, MD  spironolactone (ALDACTONE) 25 MG tablet Take 1 tablet (25 mg total) by mouth daily. 04/20/19 04/19/20 Yes Josue Hector, MD  dextromethorphan-guaiFENesin [facility] DM) 30-600 MG 12hr tablet Take 1 tablet 2 (two) times daily by mouth. Patient not taking: Reported on 07/03/2019 11/30/16   Maryellen Pile, MD  diphenhydrAMINE (BENADRYL) 25 MG tablet Take 2 tablets (50 mg total) by mouth every 8 (eight) hours as needed for up to 14 days (Nausea). Patient not taking: Reported on 07/03/2019 10/16/18 07/03/19  Trilby Drummer,  Alexander, MD  guaiFENesin-codeine [facility]) 100-10 MG/5ML syrup Take 10 mLs by mouth at bedtime as needed for cough. Patient not taking: Reported on 07/03/2019 12/12/17   Asencion Noble, MD  HYDROcodone-acetaminophen (NORCO/VICODIN) 5-325 MG tablet Take 1 tablet by mouth every 6 (six) hours as needed for severe pain. Patient not taking: Reported on 07/03/2019 12/25/17   Charyl Bigger, MD  Insulin Pen Needle (B-D UF III MINI PEN NEEDLES) 31G X 5 MM MISC 1 Stick by Does not apply route daily. 11.69, insulin requirement 11/05/18   [name], MD    Inpatient Medications: Scheduled Meds: . sodium chloride flush  3 mL Intravenous Once   Continuous Infusions:  PRN Meds:   Allergies:   No Known Allergies  Social History:   Social History   Socioeconomic History  . Marital status: Married    Spouse name: Not on file  . Number of children: Not on file  . Years of education: Not on file  . Highest education level: Not on file  Occupational History  . Occupation: Chemical engineer  Tobacco Use  . Smoking status: Never Smoker  . Smokeless tobacco: Never Used  Vaping Use  . Vaping Use: Never used  Substance and Sexual Activity  . Alcohol use: Yes    Alcohol/week: 0.0 standard drinks    Comment: occ  . Drug use: No  . Sexual  activity: Yes    Birth control/protection: Surgical  Other Topics Concern  . Not on file  Social History Narrative   Studying at [employer] in early childhood education.   Married.   Social Determinants of Health   Financial Resource Strain:   . Difficulty of Paying Living Expenses:   Food Insecurity:   . Worried About Charity fundraiser in the Last Year:   . Arboriculturist in the Last Year:   Transportation Needs:   . Film/video editor (Medical):   Marland Kitchen Lack of Transportation (Non-Medical):   Physical Activity:   . Days of Exercise per Week:   . Minutes of Exercise per Session:   Stress:   . Feeling of Stress :   Social Connections:   . Frequency of Communication with Friends and Family:   . Frequency of Social Gatherings with Friends and Family:   . Attends Religious Services:   . Active Member of Clubs or Organizations:   . Attends Archivist Meetings:   Marland Kitchen Marital Status:   Intimate Partner Violence:   . Fear of Current or Ex-Partner:   . Emotionally Abused:   Marland Kitchen Physically Abused:   . Sexually Abused:     Family History:    Family History  Problem Relation Age of Onset  . Hypertension Father   . Diabetes Father      ROS:  Please see the history of present illness.  General:no colds or fevers, some wt loss of 7 lbs since 11/2018 Skin:no rashes or ulcers HEENT:no blurred vision, no congestion CV:see HPI PUL:see HPI GI:no diarrhea constipation or melena, no indigestion GU:no hematuria, no dysuria MS:no joint pain, no claudication Neuro:no syncope, no lightheadedness Endo:+ diabetes has not been well controlled, no thyroid disease  All other ROS reviewed and negative.     Physical Exam/Data:   Vitals:   07/03/19 1415 07/03/19 1430 07/03/19 1445 07/03/19 1500  BP: 122/75 125/74 132/75 123/65  Pulse: 73 77 78 81  Resp: 13 17 20 19   Temp:      TempSrc:  SpO2: 98% 96% 96% 97%  Weight:      Height:       No intake or output data in the 24  hours ending 07/03/19 1527 Last 3 Weights 07/03/2019 12/04/2018 11/17/2018  Weight (lbs) 194 lb 201 lb 9.6 oz 204 lb  Weight (kg) 87.998 kg 91.445 kg 92.534 kg     Body mass index is 37.89 kg/m.  General:  Well nourished, well developed, in no acute distress HEENT: normal Lymph: no adenopathy Neck: mild JVD Endocrine:  No thryomegaly Vascular: No carotid bruits; pedal pulses 2+ bilaterally  Cardiac:  Distant with normal S1, S2; RRR; no murmur gallup rub or click, Chest wall tenderness to rt of sternum in one area. Lungs:  clear to auscultation bilaterally, no wheezing, rhonchi or rales  Abd: soft, nontender, no hepatomegaly  Ext: no edema Musculoskeletal:  No deformities, BUE and BLE strength normal and equal Skin: warm and dry  Neuro:  Alert and oriented X 3 MAE follows commands, no focal abnormalities noted Psych:  Normal affect     Relevant CV Studies: IMPRESSIONS    1. Left ventricular ejection fraction, by visual estimation, is 30 to  35%. The left ventricle has normal function. There is no left ventricular  hypertrophy.  2. Abnormal septal motion consistent with left bundle branch block.  3. Elevated left atrial and left ventricular end-diastolic pressures.  4. Left ventricular diastolic parameters are consistent with Grade II  diastolic dysfunction (pseudonormalization).  5. Global right ventricle has normal systolic function.The right  ventricular size is normal. No increase in right ventricular wall  thickness.  6. Left atrial size was mildly dilated.  7. Right atrial size was normal.  8. The mitral valve is normal in structure. Mild mitral valve  regurgitation. No evidence of mitral stenosis.  9. The tricuspid valve is normal in structure. Tricuspid valve  regurgitation is not demonstrated.  10. The aortic valve is normal in structure. Aortic valve regurgitation is  not visualized. No evidence of aortic valve sclerosis or stenosis.  11. The pulmonic  valve was normal in structure. Pulmonic valve  regurgitation is not visualized.  12. The inferior vena cava is normal in size with greater than 50%  respiratory variability, suggesting right atrial pressure of 3 mmHg.   FINDINGS  Left Ventricle: Left ventricular ejection fraction, by visual estimation,  is 30 to 35%. The left ventricle has normal function. There is no left  ventricular hypertrophy. Abnormal (paradoxical) septal motion, consistent  with left bundle branch block.  Left ventricular diastolic parameters are consistent with Grade II  diastolic dysfunction (pseudonormalization). Elevated left atrial and left  ventricular end-diastolic pressures.   Right Ventricle: The right ventricular size is normal. No increase in  right ventricular wall thickness. Global RV systolic function is has  normal systolic function.   Left Atrium: Left atrial size was mildly dilated.   Right Atrium: Right atrial size was normal in size   Pericardium: There is no evidence of pericardial effusion.   Mitral Valve: The mitral valve is normal in structure. No evidence of  mitral valve stenosis by observation. Mild mitral valve regurgitation.   Tricuspid Valve: The tricuspid valve is normal in structure. Tricuspid  valve regurgitation is not demonstrated.   Aortic Valve: The aortic valve is normal in structure. Aortic valve  regurgitation is not visualized. The aortic valve is structurally normal,  with no evidence of sclerosis or stenosis.   Pulmonic Valve: The pulmonic valve was normal in structure. Pulmonic  valve  regurgitation is not visualized.   Aorta: The aortic root, ascending aorta and aortic arch are all  structurally normal, with no evidence of dilitation or obstruction.   Venous: The inferior vena cava is normal in size with greater than 50%  respiratory variability, suggesting right atrial pressure of 3 mmHg.   IAS/Shunts: No atrial level shunt detected by color flow Doppler.  No  ventricular septal defect is seen or detected. There is no evidence of an  atrial septal defect.      LEFT VENTRICLE  PLAX 2D  LVIDd:     4.63 cm Diastology  LVIDs:     3.86 cm LV e' lateral:  7.15 cm/s  LV PW:     1.08 cm LV E/e' lateral: 12.9  LV IVS:    0.85 cm LV e' medial:  6.31 cm/s  LVOT diam:   2.70 cm LV E/e' medial: 14.6  LV SV:     35 ml  LV SV Index:  16.99  LVOT Area:   5.73 cm     RIGHT VENTRICLE  RV S prime:   14.50 cm/s  TAPSE (M-mode): 2.7 cm   Laboratory Data:  High Sensitivity Troponin:   Recent Labs  Lab 07/03/19 1011 07/03/19 1345  TROPONINIHS 6 7     Chemistry Recent Labs  Lab 07/03/19 1011  NA 139  K 4.0  CL 105  CO2 23  GLUCOSE 134*  BUN 14  CREATININE 0.81  CALCIUM 9.2  GFRNONAA >60  GFRAA >60  ANIONGAP 11    No results for input(s): PROT, ALBUMIN, AST, ALT, ALKPHOS, BILITOT in the last 168 hours. Hematology Recent Labs  Lab 07/03/19 1011  WBC 6.3  RBC 3.81*  HGB 12.5  HCT 38.6  MCV 101.3*  MCH 32.8  MCHC 32.4  RDW 12.3  PLT 318   BNP Recent Labs  Lab 07/03/19 1011  BNP 33.0    DDimer No results for input(s): DDIMER in the last 168 hours.   Radiology/Studies:  DG Chest 2 View  Result Date: 07/03/2019 CLINICAL DATA:  Intermittent central chest pain beginning yesterday. EXAM: CHEST - 2 VIEW COMPARISON:  07/11/2018 FINDINGS: Lungs are adequately inflated and otherwise clear. Cardiomediastinal silhouette and remainder of the exam is unchanged. IMPRESSION: No active cardiopulmonary disease. Electronically Signed   By: Marin Olp M.D.   On: 07/03/2019 10:49       HEAR Score (for undifferentiated chest pain):     4   Assessment and Plan:   1. Chest pain -EKG with old  LBBB and neg troponin.  (no cardiac cath but neg stress test 2019)  Chest pain atypical for cardiac  Painful on palpation R chest and pt says pain was worse with movement (bending)   Most likely  musculoskeletal.  Give Toradolx 1   2. NIDCM with decrease of EF to 30-35% in Nov 2020.  Pt on entresto and BB at max dose and spironolactone 12.5 daily. -Volume status is OK   Will make sure she has outpt f/u   3. DM-2 on synjardy XR  Insulin, januvia  Hgb A`1`c was 9.9 in 11/2018  4. GERD on protonix  5. Hx carpal tunnel.      For questions or updates, please contact [facility] Please consult [email or web address] for contact info under    Signed, Cecilie Kicks, NP  07/03/2019 3:27 PM   Patient seen and examined   I agree with findings as noted abvoe by Serita Butcher   I  have amended this note  Pt is a 54 yo with presumed NICM   She has been followed in cardiology   Medications have been titrated. Pt doing good until yesterday had episode of substernal/R parasternal pain.  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  I do not think it is cardiac in origin   Appears more musculoskeletal   Would try Toradol x 1  Follow  Rest 2.  Cardiomyopathy:  Presumed nonischemic   Volume status good   Will make sure pt hs f/u appt   OK to d/c home   If recurs/changes contact sooner/return.  Dorris Carnes MD

## 2019-07-03 NOTE — ED Provider Notes (Signed)
[facility] EMERGENCY DEPARTMENT Provider Note   CSN: [id number] Arrival date & time: 07/03/19  0955     History Chief Complaint  Patient presents with  . Chest Pain    Sandra [name] is a 54 y.o. female with PMHx LBBB, HTN, Diabetes, Cardiomyopathy who presents to the ED today with complaint of gradual onset, intermittent, sharp, substernal chest pain that started yesterday afternoon.  Patient reports she was at work lifting something off the ground when she began having some chest pain.  She also reports diaphoresis.  She states that the pain lasted approximately 3 to 4 hours before dissipating.  She felt fine at nighttime however this morning again while at work she began having chest pain.  She states her friend was concerned about her and told her to call her cardiologist.  Patient called in provided information including the fact that she had chest pain.  She was told to come to the ED immediately for further evaluation.  She has not taken anything for pain prior to arrival however reports that her pain is dissipating.  She currently rates it a 3 out of 10.  She does take 1 baby aspirin every day.  She denies any personal MI history.  She denies any family history.  She is a non-smoker.   The history is provided by the patient and medical records.       Past Medical History:  Diagnosis Date  . Acute pain of left shoulder 06/07/2017  . Asthma    as a child  . Cardiomyopathy ([place])    likely nonischemic DCM per Dr. Johnsie Cancel note 04/2017  . Carpal tunnel syndrome   . Chest pain 07/11/2018  . Diabetes mellitus type 2, uncontrolled (HCC) DX: 1999   Started as gestational diabetes  . ESSENTIAL HYPERTENSION 07/08/2006  . Gallstones   . HSV (herpes simplex virus) anogenital infection   . HYPERLIPIDEMIA 08/20/2007  . LBBB (left bundle branch block)   . Menorrhagia   . Migraine    Normal CT head (08/06/2006)  . Swelling of abdominal wall 03/02/2014  . Swelling of  abdominal wall 03/02/2014  . Symptoms of upper respiratory infection (URI) 04/01/2018  . Viral URI with cough 01/13/2013    Patient Active Problem List   Diagnosis Date Noted  . 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 Right low back pain 03/02/2014  . Psoriasis 04/29/2013  . Adenomyosis 06/03/2012  . Obesity 05/16/2012  . Chronic HFrEF (heart failure with reduced ejection fraction) ([place]) 02/22/2012  . Perimenopausal menorrhagia 02/20/2012  . LBBB (left bundle branch block) 01/24/2012  . Routine adult health maintenance 09/27/2010  . Diabetes mellitus type 2 in obese ([place]) 11/17/2009  . Hyperlipidemia 08/20/2007  . Migraine without aura 09/02/2006  . Essential hypertension 07/08/2006  . HSV 11/07/2005  . Asthma 11/07/2005    Past Surgical History:  Procedure Laterality Date  . CHOLECYSTECTOMY  2009  . DILATATION & CURETTAGE/HYSTEROSCOPY WITH MYOSURE N/A 12/25/2017   Procedure: DILATATION & CURETTAGE/HYSTEROSCOPY   POLYPECTOMY WITH MYOSURE;  Surgeon: Charyl Bigger, MD;  Location: [facility];  Service: Gynecology;  Laterality: N/A;  . TUBAL LIGATION       OB History    Gravida  2   Para  1   Term  1   Preterm      AB  1   Living  1     SAB      TAB  1   Ectopic      Multiple      Live Births              Family History  Problem Relation Age of Onset  . Hypertension Father   . Diabetes Father     Social History   Tobacco Use  . Smoking status: Never Smoker  . Smokeless tobacco: Never Used  Vaping Use  . Vaping Use: Never used  Substance Use Topics  . Alcohol use: Yes    Alcohol/week: 0.0 standard drinks    Comment: occ  . Drug use: No    Home Medications Prior to Admission medications   Medication Sig Start Date End Date Taking? Authorizing Provider  albuterol (PROVENTIL HFA;VENTOLIN HFA) 108 (90 Base) MCG/ACT inhaler Inhale 1-2 puffs into the lungs every 6 (six) hours as needed for  wheezing or shortness of breath. 03/21/17  Yes Ledell Noss, MD  aspirin EC 81 MG tablet Take 81 mg by mouth daily. Swallow whole.   Yes [provider]  atorvastatin (LIPITOR) 40 MG tablet Take 1 tablet by mouth once daily Patient taking differently: Take 40 mg by mouth daily.  05/28/19  Yes Aldine Contes, MD  BIOTIN PO Take 500 mg by mouth daily.   Yes [provider]  CINNAMON PO Take 1,000 mg by mouth 2 (two) times daily.   Yes [provider]  eletriptan (RELPAX) 20 MG tablet TAKE 1 TABLET BY MOUTH AS NEEDED FOR  MIGRAINE  OR  HEADACHE.  MAY  REPEAT  IN  2  HOURS  IF  HEADACHE  PERSISTS  OR  RECURS Patient taking differently: Take 20 mg by mouth as needed for migraine or headache (MAY REPEAT IN 2 HOURS, IF NO RELIEF).  04/30/18  Yes [name], MD  Empagliflozin-metFORMIN HCl ER (SYNJARDY XR) 05-998 MG TB24 Take 1 tablet by mouth 2 (two) times daily with a meal. 08/11/18  Yes [name], MD  fluticasone [facility]) 50 MCG/ACT nasal spray Place 1 spray into both nostrils daily as needed for allergies.    Yes [provider]  ibuprofen (ADVIL) 200 MG tablet Take 400-600 mg by mouth every 6 (six) hours as needed.   Yes [provider]  Insulin Glargine (BASAGLAR KWIKPEN) 100 UNIT/ML SOPN Inject 0.08 mLs (8 Units total) into the skin daily. Patient taking differently: Inject 8 Units into the skin at bedtime.  12/04/18  Yes [name], MD  JANUVIA 100 MG tablet Take 1 tablet by mouth once daily Patient taking differently: Take 100 mg by mouth at bedtime.  06/30/19  Yes [name], MD  metoprolol succinate (TOPROL-XL) 200 MG 24 hr tablet Take 1 tablet (200 mg total) by mouth daily. Take with or immediately following a meal. 03/18/19 03/12/20 Yes Josue Hector, MD  ondansetron (ZOFRAN) 4 MG tablet Take 1 tablet (4 mg total) by mouth daily as needed for up to 10 doses for nausea or vomiting. 11/05/18  Yes [name], MD    pantoprazole (PROTONIX) 20 MG tablet Take 1 tablet by mouth once daily Patient taking differently: Take 20 mg by mouth daily before breakfast.  05/12/19  Yes [name], MD  promethazine (PHENERGAN) 12.5 MG tablet Take 1 tablet (12.5 mg total) by mouth every 6 (six) hours as needed for nausea. 02/24/16  Yes Lenore Cordia, MD  sacubitril-valsartan (ENTRESTO) 97-103 MG Take 1 tablet by mouth 2 (two) times daily. 02/11/19  Yes Josue Hector, MD  spironolactone (ALDACTONE)  25 MG tablet Take 1 tablet (25 mg total) by mouth daily. 04/20/19 04/19/20 Yes Josue Hector, MD  dextromethorphan-guaiFENesin [facility] DM) 30-600 MG 12hr tablet Take 1 tablet 2 (two) times daily by mouth. Patient not taking: Reported on 07/03/2019 11/30/16   Maryellen Pile, MD  diphenhydrAMINE (BENADRYL) 25 MG tablet Take 2 tablets (50 mg total) by mouth every 8 (eight) hours as needed for up to 14 days (Nausea). Patient not taking: Reported on 07/03/2019 10/16/18 07/03/19  [name], MD  guaiFENesin-codeine [facility]) 100-10 MG/5ML syrup Take 10 mLs by mouth at bedtime as needed for cough. Patient not taking: Reported on 07/03/2019 12/12/17   Asencion Noble, MD  HYDROcodone-acetaminophen (NORCO/VICODIN) 5-325 MG tablet Take 1 tablet by mouth every 6 (six) hours as needed for severe pain. Patient not taking: Reported on 07/03/2019 12/25/17   Charyl Bigger, MD  Insulin Pen Needle (B-D UF III MINI PEN NEEDLES) 31G X 5 MM MISC 1 Stick by Does not apply route daily. 11.69, insulin requirement 11/05/18   [name], MD    Allergies    Patient has no known allergies.  Review of Systems   Review of Systems  Constitutional: Positive for diaphoresis. Negative for chills and fever.  Respiratory: Negative for shortness of breath.   Cardiovascular: Positive for chest pain.  Gastrointestinal: Negative for nausea and vomiting.  All other systems reviewed and are negative.   Physical Exam Updated Vital  Signs BP 129/66 (BP Location: Right Arm)   Pulse 82   Temp 98.6 F (37 C) (Oral)   Resp 16   Ht 5' (1.524 m)   Wt 88 kg   SpO2 98%   BMI 37.89 kg/m   Physical Exam Vitals and nursing note reviewed.  Constitutional:      Appearance: She is not ill-appearing or diaphoretic.  HENT:     Head: Normocephalic and atraumatic.  Eyes:     Conjunctiva/sclera: Conjunctivae normal.  Cardiovascular:     Rate and Rhythm: Normal rate and regular rhythm.     Pulses:          Radial pulses are 2+ on the right side and 2+ on the left side.       Dorsalis pedis pulses are 2+ on the right side and 2+ on the left side.  Pulmonary:     Effort: Pulmonary effort is normal.     Breath sounds: Normal breath sounds. No decreased breath sounds, wheezing, rhonchi or rales.  Chest:     Chest wall: Tenderness present.  Abdominal:     Palpations: Abdomen is soft.     Tenderness: There is no abdominal tenderness. There is no guarding or rebound.  Musculoskeletal:     Cervical back: Neck supple.     Right lower leg: No edema.     Left lower leg: No edema.  Skin:    General: Skin is warm and dry.  Neurological:     Mental Status: She is alert.     ED Results / Procedures / Treatments   Labs (all labs ordered are listed, but only abnormal results are displayed) Labs Reviewed  BASIC METABOLIC PANEL - Abnormal; Notable for the following components:      Result Value   Glucose, Bld 134 (*)    All other components within normal limits  CBC - Abnormal; Notable for the following components:   RBC 3.81 (*)    MCV 101.3 (*)    All other components within normal limits  BRAIN NATRIURETIC PEPTIDE  I-STAT BETA HCG BLOOD, ED ([facility], [facility], [facility] ONLY)  TROPONIN I (HIGH SENSITIVITY)  TROPONIN I (HIGH SENSITIVITY)    EKG EKG Interpretation  Date/Time:  Friday July 03 2019 09:54:56 EDT Ventricular Rate:  89 PR Interval:  152 QRS Duration: 134 QT Interval:  400 QTC Calculation: 486 R Axis:   20 Text  Interpretation: Normal sinus rhythm Non-specific intra-ventricular conduction block Cannot rule out Anterior infarct , age undetermined Abnormal ECG No significant change since last tracing Confirmed by Lennice Sites [id number]) on 07/03/2019 11:29:59 AM   Radiology DG Chest 2 View  Result Date: 07/03/2019 CLINICAL DATA:  Intermittent central chest pain beginning yesterday. EXAM: CHEST - 2 VIEW COMPARISON:  07/11/2018 FINDINGS: Lungs are adequately inflated and otherwise clear. Cardiomediastinal silhouette and remainder of the exam is unchanged. IMPRESSION: No active cardiopulmonary disease. Electronically Signed   By: Marin Olp M.D.   On: 07/03/2019 10:49    Procedures Procedures (including critical care time)  Medications Ordered in ED Medications  sodium chloride flush (NS) 0.9 % injection 3 mL (3 mLs Intravenous Not Given 07/03/19 1143)  aspirin chewable tablet 243 mg (243 mg Oral Given 07/03/19 1137)    ED Course  I have reviewed the triage vital signs and the nursing notes.  Pertinent labs & imaging results that were available during my care of the patient were reviewed by me and considered in my medical decision making (see chart for details).    MDM Rules/Calculators/A&P                          54 year old female presents to the ED today complaining of substernal chest pain radiating to right arm intermittently for the past 2 days.  Called cardiology office today and was told to come to the ED for further evaluation.  States that she was diaphoretic with chest pain however she is not currently diaphoretic on exam.  On arrival to the ED patient is afebrile, nontachycardic and nontachypneic.  She rates her pain at 3 out of 10.  She does take daily baby aspirin.  Will provide additional 3 tablets of aspirin and reassess.  She has no history of MI or family history of MI however she does have nonischemic cardiomyopathy and known left bundle branch block.  Will work-up for ACS at this  time.  Given she is nontachycardic and satting 98% on room air without any active shortness of breath or chest pain I very much doubt PE.  BNP has been added given history of cardiomyopathy.  EKG unchanged, does show known left bundle branch block. X-ray clear with no signs of vascular congestion. CBC without leukocytosis.  Hemoglobin stable at 12.5. BMP with glucose 134.  No other abnormalities. Troponin of 6, will repeat. BNP 33.  On reassessment after 3 additional tablets of aspirin patient reports 0 out of 10 chest pain.  She is resting comfortably.  Will consult cardiology and she was advised by cardiology office to come to the ED today.  Cardiology to come evaluate patient in the ED.  Repeat troponin has returned at 7.  3:28 PM At shift change case signed out to [facility], PA-C, who will dispo patient accordingly after cards evaluates.   Final Clinical Impression(s) / ED Diagnoses Final diagnoses:  Nonspecific chest pain    Rx / DC Orders ED Discharge Orders    None       Eustaquio Maize, PA-C 07/03/19 1528  Lennice Sites, DO 07/03/19 1536

## 2019-07-03 NOTE — ED Notes (Signed)
Patient verbalizes understanding of discharge instructions. Opportunity for questioning and answers were provided. Armband removed by staff, pt discharged from ED ambulatory w/ sig other

## 2019-07-03 NOTE — Telephone Encounter (Signed)
Call received directly from operator. I spoke with the patient who states that she is currently having chest pain. She states that it has been on and off since yesterday. She describes the pain as sharp/stabbing. She reports that the pain radiates to her back. She states that she is also sweating. She denies any SOB or dizziness. Has not taken her BP/HR today. She does not have any nitroglycerin. I have advised her to go to the ER to be evaluated. Patient verbalized understanding.

## 2019-07-03 NOTE — ED Provider Notes (Signed)
Accepted handoff at shift change from [facility]. Please see prior provider note for more detail.   Briefly: Patient is 54 y.o. patient is a 54 year old female past medical history significant for nonischemic cardiomyopathy presented today with chest pain which has resolved.  She has had episodes of diaphoresis but no nausea or vomiting.  No chest pain currently.  Has a history of right bundle branch block.  Plan: Follow-up on cardiology recommendations   Physical Exam  BP 123/65   Pulse 81   Temp 98.6 F (37 C) (Oral)   Resp 19   Ht 5' (1.524 m)   Wt 88 kg   SpO2 97%   BMI 37.89 kg/m   Physical Exam  ED Course/Procedures     Procedures   MDM    Patient evaluated by cardiology who recommends naproxen use and discharged home with follow-up with cardiologist.    Tedd Sias, PA 07/03/19 1745    Blanchie Dessert, MD 07/03/19 1845

## 2019-07-03 NOTE — ED Triage Notes (Signed)
Patient arrives to ED with complaints of intermittent centralized chest pain starting yesterday. Patient states when the chest pain would come she would get diaphoretic. Patient reports hx of heart failure.

## 2019-07-03 NOTE — Telephone Encounter (Signed)
Pt c/o of Chest Pain: STAT if CP now or developed within 24 hours  1. Are you having CP right now? yes  2. Are you experiencing any other symptoms (ex. SOB, nausea, vomiting, sweating)? sweating  3. How long have you been experiencing CP? Started yesterday but getting worse  4. Is your CP continuous or coming and going? Comes and goes  5. Have you taken Nitroglycerin? No   Patient states her chest pain started yesterday and is getting worse. She states it also goes to her back and she is hot and sweaty.  ?

## 2019-07-03 NOTE — Discharge Instructions (Addendum)
Please use naproxen at home for pain.  Please follow-up with your cardiologist.

## 2019-08-05 ENCOUNTER — Ambulatory Visit: Payer: BC Managed Care – PPO | Admitting: Physician Assistant

## 2019-08-11 NOTE — Progress Notes (Signed)
CARDIOLOGY OFFICE NOTE  Date:  08/18/2019    [name] Date of Birth: 07-12-1965 Medical Record #[id number]  PCP:  Andrew Au, MD  Cardiologist:  Johnsie Cancel   Chief Complaint  Patient presents with  . Follow-up    History of Present Illness: NEILANI DUFFEE is a 54 y.o. female who presents today for a follow up/post hospital visit. Seen for Dr. Johnsie Cancel.   She has a history of DM, HLD, HTN, atypical chest pain with chronic LBBB, NICM from 2014 with prior EF of 45%. She has had prior cardiac MRI from 2014 with EF of 46%  Last seen here in October of 2020 by Dr. Johnsie Cancel. EF little lower on follow up echo. ACE was changed to [facility] - she was referred to pharmacy here for med titration.   Presented last month with chest pain. Not vaccinated. Works in [employer]. Chest pain was reproducible and felt to be musculoskeletal in origin.   Comes in today. Here alone. She feels like she is doing ok overall. Notes a right upper chest discomfort with moving the right arm - like with wiping tables, bending over, etc. Nothing exertional. Muscle relaxers help. Not short of breath. Asking about her heart condition and "what all that means". She seems to tolerate her medicines well. She denies being short of breath. No swelling. Tries to watch her salt. Diabetes is not controlled.   Past Medical History:  Diagnosis Date  . Acute pain of left shoulder 06/07/2017  . Asthma    as a child  . Asthma 11/07/2005   Reported Diagnosis in 2012, no formal spirometry   . Cardiomyopathy ([place])    likely nonischemic DCM per Dr. Johnsie Cancel note 04/2017  . Carpal tunnel syndrome   . Chest pain 07/11/2018  . Chronic HFrEF (heart failure with reduced ejection fraction) ([place]) 02/22/2012  . Diabetes mellitus type 2 in obese ([place]) 11/17/2009   - Synjardy 05-998 BID, Januvia 100mg  Daily,  Basaglar - Failed to tolerate Victoza  . Diabetes mellitus type 2, uncontrolled (HCC) DX: 1999   Started as gestational  diabetes  . ESSENTIAL HYPERTENSION 07/08/2006  . Essential hypertension 07/08/2006  . 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 Gallstones   . HSV 11/07/2005   Qualifier: Diagnosis of  By: Pearline Cables MD, Belenda Cruise    . HSV (herpes simplex virus) anogenital infection   . HYPERLIPIDEMIA 08/20/2007  . Hyperlipidemia 08/20/2007   Atorvastatin 40mg  Daily Last Lipid panel may 2019: Total 138, HDL 31, LDL 89. ASCVD risk of 14.5%   . LBBB (left bundle branch block)   . Menorrhagia   . Migraine    Normal CT head (08/06/2006)  . Migraine without aura 09/02/2006   Fairly Well-controlled  2 per month  Takes Elitriptan   . 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 Perimenopausal menorrhagia 02/20/2012  . Psoriasis 04/29/2013  . Right low back pain 03/02/2014  . Routine adult health maintenance 09/27/2010  . Swelling of abdominal wall 03/02/2014  . Swelling of abdominal wall 03/02/2014  . Symptoms of upper respiratory infection (URI) 04/01/2018  . Viral URI with cough 01/13/2013    Past Surgical History:  Procedure Laterality Date  . CHOLECYSTECTOMY  2009  . DILATATION & CURETTAGE/HYSTEROSCOPY WITH MYOSURE N/A 12/25/2017   Procedure: DILATATION & CURETTAGE/HYSTEROSCOPY   POLYPECTOMY WITH MYOSURE;  Surgeon: Charyl Bigger, MD;  Location: [facility];  Service: Gynecology;  Laterality: N/A;  . TUBAL LIGATION  Medications: Current Meds  Medication Sig  . aspirin EC 81 MG tablet Take 81 mg by mouth daily. Swallow whole.  Marland Kitchen atorvastatin (LIPITOR) 40 MG tablet Take 1 tablet by mouth once daily  . BIOTIN PO Take 500 mg by mouth daily.  Marland Kitchen CINNAMON PO Take 1,000 mg by mouth 2 (two) times daily.  Marland Kitchen eletriptan (RELPAX) 20 MG tablet TAKE 1 TABLET BY MOUTH AS NEEDED FOR  MIGRAINE   OR  HEADACHE.  MAY  REPEAT  IN  2  HOURS  IF  HEADACHE  PERSISTS  OR  RECURS  . Empagliflozin-metFORMIN HCl ER (SYNJARDY XR) 05-998 MG TB24 Take 1 tablet by mouth 2 (two) times daily with a meal.  . guaiFENesin-codeine (ROBITUSSIN AC) 100-10 MG/5ML syrup Take 10 mLs by mouth at bedtime as needed for cough.  Marland Kitchen ibuprofen (ADVIL) 800 MG tablet Take 800 mg by mouth every 6 (six) hours as needed.  . Insulin Glargine (BASAGLAR KWIKPEN) 100 UNIT/ML SOPN Inject 0.08 mLs (8 Units total) into the skin daily.  . Insulin Pen Needle (B-D UF III MINI PEN NEEDLES) 31G X 5 MM MISC 1 Stick by Does not apply route daily. 11.69, insulin requirement  . JANUVIA 100 MG tablet Take 1 tablet by mouth once daily  . methocarbamol (ROBAXIN) 500 MG tablet Take 1 tablet (500 mg total) by mouth 2 (two) times daily.  . metoprolol succinate (TOPROL-XL) 200 MG 24 hr tablet Take 1 tablet (200 mg total) by mouth daily. Take with or immediately following a meal.  . ondansetron (ZOFRAN) 4 MG tablet Take 1 tablet (4 mg total) by mouth daily as needed for up to 10 doses for nausea or vomiting.  . pantoprazole (PROTONIX) 20 MG tablet Take 1 tablet by mouth once daily  . sacubitril-valsartan (ENTRESTO) 97-103 MG Take 1 tablet by mouth 2 (two) times daily.  Marland Kitchen spironolactone (ALDACTONE) 25 MG tablet Take 1 tablet (25 mg total) by mouth daily.     Allergies: No Known Allergies  Social History: The patient  reports that she has never smoked. She has never used smokeless tobacco. She reports current alcohol use. She reports that she does not use drugs.   Family History: The patient's family history includes Diabetes in her father; Hypertension in her father.   Review of Systems: Please see the history of present illness.   All other systems are reviewed and negative.   Physical Exam: VS:  BP (!) 130/82   Pulse 87   Ht 5' (1.524 m)   Wt 200 lb 6.4 oz (90.9 kg)   SpO2 96%   BMI 39.14 kg/m  .  BMI Body mass index is 39.14  kg/m.  Wt Readings from Last 3 Encounters:  08/18/19 200 lb 6.4 oz (90.9 kg)  07/03/19 194 lb (88 kg)  12/04/18 201 lb 9.6 oz (91.4 kg)    General: Pleasant. Alert and in no acute distress.  She is obese.  Cardiac: Regular rate and rhythm. No murmurs, rubs, or gallops. No edema.  Respiratory:  Lungs are clear to auscultation bilaterally with normal work of breathing.  GI: Soft and nontender.  MS: No deformity or atrophy. Gait and ROM intact.  Skin: Warm and dry. Color is normal.  Neuro:  Strength and sensation are intact and no gross focal deficits noted.  Psych: Alert, appropriate and with normal affect.   LABORATORY DATA:  EKG:  EKG is not ordered today.    Lab Results  Component Value Date   WBC  6.3 07/03/2019   HGB 12.5 07/03/2019   HCT 38.6 07/03/2019   PLT 318 07/03/2019   GLUCOSE 134 (H) 07/03/2019   CHOL 126 07/11/2018   TRIG 143 07/11/2018   HDL 32 (L) 07/11/2018   LDLCALC 65 07/11/2018   ALT 26 02/21/2016   AST 31 02/21/2016   NA 139 07/03/2019   K 4.0 07/03/2019   CL 105 07/03/2019   CREATININE 0.81 07/03/2019   BUN 14 07/03/2019   CO2 23 07/03/2019   TSH 2.361 05/15/2012   INR 1.06 02/21/2016   HGBA1C 9.9 (A) 12/04/2018   MICROALBUR 0.50 05/29/2012     BNP (last 3 results) Recent Labs    07/03/19 1011  BNP 33.0    ProBNP (last 3 results) No results for input(s): PROBNP in the last 8760 hours.   Other Studies Reviewed Today:  ECHO IMPRESSIONS 11/2018  1. Left ventricular ejection fraction, by visual estimation, is 30 to  35%. The left ventricle has normal function. There is no left ventricular  hypertrophy.  2. Abnormal septal motion consistent with left bundle branch block.  3. Elevated left atrial and left ventricular end-diastolic pressures.  4. Left ventricular diastolic parameters are consistent with Grade II  diastolic dysfunction (pseudonormalization).  5. Global right ventricle has normal systolic function.The right   ventricular size is normal. No increase in right ventricular wall  thickness.  6. Left atrial size was mildly dilated.  7. Right atrial size was normal.  8. The mitral valve is normal in structure. Mild mitral valve  regurgitation. No evidence of mitral stenosis.  9. The tricuspid valve is normal in structure. Tricuspid valve  regurgitation is not demonstrated.  10. The aortic valve is normal in structure. Aortic valve regurgitation is  not visualized. No evidence of aortic valve sclerosis or stenosis.  11. The pulmonic valve was normal in structure. Pulmonic valve  regurgitation is not visualized.  12. The inferior vena cava is normal in size with greater than 50%  respiratory variability, suggesting right atrial pressure of 3 mmHg.    Myoview Study Highlights 04/2017   Nuclear stress EF: 38%.  No T wave inversion was noted during stress.  There was no ST segment deviation noted during stress.  Defect 1: There is a small defect of mild severity present in the apical septal location.  This is an intermediate risk study.  The left ventricular ejection fraction is moderately decreased (30-44%).   Intermediate risk study due to moderately depressed left ventricular systolic function, but without reversible perfusion abnormalities. There is a small fixed LBBB-related perfusion artifact.     ASSESSMENT & PLAN:   1. Prior ER visit for chest pain - this is worse with movement of the right arm - musculoskeletal in origin. Would favor use of Tylenol.   2. Chronic LBBB  3. NICM - on good guideline therapy with Toprol, Aldactone and Entresto - would plan to update her echo and see if we have had improvement in EF. Explained need for salt restriction, BP control, etc. Continue with current regimen.   4. DM - uncontrolled.   5. GERD  6. Chronic LBBB - low risk Myoview from 2019.  Current medicines are reviewed with the patient today.  The patient does not have concerns  regarding medicines other than what has been noted above.  The following changes have been made:  See above.  Labs/ tests ordered today include:    Orders Placed This Encounter  Procedures  . ECHOCARDIOGRAM COMPLETE  Disposition:   FU with Dr. Johnsie Cancel as planned in October - will arrange for echo prior to that visit.    Patient is agreeable to this plan and will call if any problems develop in the interim.   SignedTruitt Merle, NP  08/18/2019 12:31 PM  [facility] [address] [place], [place]  [address] Phone: [phone] Fax: [phone]

## 2019-08-17 ENCOUNTER — Other Ambulatory Visit: Payer: Self-pay | Admitting: Internal Medicine

## 2019-08-18 ENCOUNTER — Encounter: Payer: Self-pay | Admitting: Nurse Practitioner

## 2019-08-18 ENCOUNTER — Ambulatory Visit (INDEPENDENT_AMBULATORY_CARE_PROVIDER_SITE_OTHER): Payer: BC Managed Care – PPO | Admitting: Nurse Practitioner

## 2019-08-18 ENCOUNTER — Other Ambulatory Visit: Payer: Self-pay

## 2019-08-18 VITALS — BP 130/82 | HR 87 | Ht 60.0 in | Wt 200.4 lb

## 2019-08-18 DIAGNOSIS — I5022 Chronic systolic (congestive) heart failure: Secondary | ICD-10-CM

## 2019-08-18 DIAGNOSIS — I10 Essential (primary) hypertension: Secondary | ICD-10-CM

## 2019-08-18 DIAGNOSIS — R0789 Other chest pain: Secondary | ICD-10-CM

## 2019-08-18 DIAGNOSIS — I420 Dilated cardiomyopathy: Secondary | ICD-10-CM

## 2019-08-18 NOTE — Patient Instructions (Addendum)
After Visit Summary:  We will be checking the following labs today - NONE   Medication Instructions:    Continue with your current medicines.    If you need a refill on your cardiac medications before your next appointment, please call your pharmacy.     Testing/Procedures To Be Arranged:  Echocardiogram prior to next OV   Follow-Up:   See Dr. Johnsie Cancel in 3 months    At [facility], you and your health needs are our priority.  As part of our continuing mission to provide you with exceptional heart care, we have created designated Provider Care Teams.  These Care Teams include your primary Cardiologist (physician) and Advanced Practice Providers (APPs -  Physician Assistants and Nurse Practitioners) who all work together to provide you with the care you need, when you need it.  Special Instructions:  . Stay safe, wash your hands for at least 20 seconds and wear a mask when needed.  . It was good to talk with you today.    Call the [place] office at [phone] if you have any questions, problems or concerns.

## 2019-08-20 ENCOUNTER — Telehealth: Payer: Self-pay | Admitting: Cardiovascular Disease

## 2019-08-20 NOTE — Telephone Encounter (Signed)
Called patient back to let her know of Dr. Kyla Balzarine recommendations.

## 2019-08-20 NOTE — Telephone Encounter (Signed)
She should have vaccine

## 2019-08-20 NOTE — Telephone Encounter (Signed)
Patient called to inquire about whether or not she is eligible to take the Covid-19 vaccination. I to informed the patient that our practice is recommending the Covid-19 vaccination to all of our patients, regards of condition of heart and cardiac medications (per DOT phrase). However, she stated she assumes the vaccination will cause inflammation. I attempted to reassure patient, but she insisted that the vaccination may worsen her condition. I made the patient aware that I would forward her inquiry to clinical staff. Please call to advise.

## 2019-09-01 ENCOUNTER — Other Ambulatory Visit: Payer: BC Managed Care – PPO

## 2019-09-01 ENCOUNTER — Other Ambulatory Visit: Payer: Self-pay | Admitting: *Deleted

## 2019-09-01 ENCOUNTER — Other Ambulatory Visit: Payer: Self-pay

## 2019-09-01 DIAGNOSIS — Z20822 Contact with and (suspected) exposure to covid-19: Secondary | ICD-10-CM

## 2019-09-01 DIAGNOSIS — E1169 Type 2 diabetes mellitus with other specified complication: Secondary | ICD-10-CM

## 2019-09-01 DIAGNOSIS — E669 Obesity, unspecified: Secondary | ICD-10-CM

## 2019-09-02 LAB — NOVEL CORONAVIRUS, NAA: SARS-CoV-2, NAA: DETECTED — AB

## 2019-09-02 LAB — SARS-COV-2, NAA 2 DAY TAT

## 2019-09-02 MED ORDER — SYNJARDY XR 5-1000 MG PO TB24: 1.0000 | ORAL_TABLET | Freq: Two times a day (BID) | ORAL | 0 refills | Status: DC

## 2019-09-03 ENCOUNTER — Telehealth: Payer: Self-pay | Admitting: *Deleted

## 2019-09-03 ENCOUNTER — Ambulatory Visit: Payer: Self-pay | Admitting: *Deleted

## 2019-09-03 NOTE — Telephone Encounter (Signed)
Patient called in to cancel appt for tomorrow as she just tested positive for Covid and is quarantining. Offered to make appt telehealth but patient declined. Only sx at present is body aches. Others in household have received their vaccines. Advised to still quarantine away from them, wash hands frequently, do not cook for others or share household items. Avoid using same bathroom if possible. Hubbard Hartshorn, BSN, RN-BC

## 2019-09-03 NOTE — Telephone Encounter (Signed)
Patient called and says she tested positive for COVD and has not had her vaccines. She says her mother lives with her and has taken her vaccine. She asked does her mother need to quarantine. I advised the patient is to quarantine away from her mom and to have her mom call her PCP for any other advice. Patient verbalized understanding.  Reason for Disposition . General information question, no triage required and triager able to answer question  Answer Assessment - Initial Assessment Questions 1. REASON FOR CALL or QUESTION: "What is your reason for calling today?" or "How can I best help you?" or "What question do you have that I can help answer?"     I have covid and my mom lives with me, does she need to quarantine?  Protocols used: INFORMATION ONLY CALL - NO TRIAGE-A-AH

## 2019-09-04 ENCOUNTER — Other Ambulatory Visit: Payer: Self-pay | Admitting: Internal Medicine

## 2019-09-04 ENCOUNTER — Encounter: Payer: BC Managed Care – PPO | Admitting: Student

## 2019-09-04 ENCOUNTER — Telehealth: Payer: Self-pay | Admitting: Student

## 2019-09-04 DIAGNOSIS — E785 Hyperlipidemia, unspecified: Secondary | ICD-10-CM

## 2019-09-04 NOTE — Telephone Encounter (Signed)
Patient had questions regarding her mother's covid tests as one was positive and second one negative. Patient's mother is not a patient at [facility]. Advised patient to contact her mother's PCP to address this. Hubbard Hartshorn, BSN, RN-BC

## 2019-09-04 NOTE — Telephone Encounter (Signed)
Pt has question regarding COVID results [phone]

## 2019-09-08 ENCOUNTER — Telehealth: Payer: Self-pay | Admitting: Nurse Practitioner

## 2019-09-08 ENCOUNTER — Other Ambulatory Visit: Payer: Self-pay | Admitting: Nurse Practitioner

## 2019-09-08 ENCOUNTER — Telehealth: Payer: Self-pay

## 2019-09-08 ENCOUNTER — Other Ambulatory Visit: Payer: Self-pay

## 2019-09-08 ENCOUNTER — Ambulatory Visit (INDEPENDENT_AMBULATORY_CARE_PROVIDER_SITE_OTHER): Payer: BC Managed Care – PPO | Admitting: Internal Medicine

## 2019-09-08 DIAGNOSIS — E1169 Type 2 diabetes mellitus with other specified complication: Secondary | ICD-10-CM

## 2019-09-08 DIAGNOSIS — I420 Dilated cardiomyopathy: Secondary | ICD-10-CM

## 2019-09-08 DIAGNOSIS — U071 COVID-19: Secondary | ICD-10-CM

## 2019-09-08 DIAGNOSIS — I10 Essential (primary) hypertension: Secondary | ICD-10-CM

## 2019-09-08 NOTE — Progress Notes (Signed)
Internal Medicine Clinic Attending ° °Case discussed with Dr. Krienke  At the time of the visit.  We reviewed the resident’s history and exam and pertinent patient test results.  I agree with the assessment, diagnosis, and plan of care documented in the resident’s note.  °

## 2019-09-08 NOTE — Progress Notes (Signed)
[facility] Residency Telephone Encounter Continuity Care Appointment  HPI:   This telephone encounter was created for [name] on 09/08/2019 for the following purpose/cc diarrhea and generalized weakness.  This is a 54 year old female with a history of asthma, diabetes, hypertension, nonischemic cardiomyopathy, HFrEF, and hyperlipidemia who was recently diagnosed with COVID 19 on 8/10 who reports that she has been having generalized weakness for the past 1 to 2 days and loose stools.  She reports she had about 3 soft bowel movements today and 2 bowel movements yesterday, reports they were very watery, denied any bloody stools or black stools.  She also endorses nausea, subjective fevers, and decreased appetite. She reports that her appetite was doing fine until yesterday, then she couldn't eat much.  She reports that she previously had body aches for a few days however states that that has resolved.  She denies any shortness of breath, chest pain, palpitations, vomiting, abdominal pain, anuria, falls, or joint pains.  Denies any changes in smell.  She reports that she was not having symptoms when she got the test done, she just got it done because she was going out of town.  She does have history of uncontrolled diabetes and has not been checking her blood sugars at home because how she has been feeling.   Past Medical History:  Past Medical History:  Diagnosis Date  . Acute pain of left shoulder 06/07/2017  . Asthma    as a child  . Asthma 11/07/2005   Reported Diagnosis in 2012, no formal spirometry   . Cardiomyopathy ([place])    likely nonischemic DCM per Dr. Johnsie Cancel note 04/2017  . Carpal tunnel syndrome   . Chest pain 07/11/2018  . Chronic HFrEF (heart failure with reduced ejection fraction) ([place]) 02/22/2012  . Diabetes mellitus type 2 in obese ([place]) 11/17/2009   - Synjardy 05-998 BID, Januvia 100mg  Daily,  Basaglar - Failed to tolerate Victoza  . Diabetes  mellitus type 2, uncontrolled ([facility]) DX: 1999   Started as gestational diabetes  . ESSENTIAL HYPERTENSION 07/08/2006  . Essential hypertension 07/08/2006  . 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 Gallstones   . HSV 11/07/2005   Qualifier: Diagnosis of  By: Pearline Cables MD, Belenda Cruise    . HSV (herpes simplex virus) anogenital infection   . HYPERLIPIDEMIA 08/20/2007  . Hyperlipidemia 08/20/2007   Atorvastatin 40mg  Daily Last Lipid panel may 2019: Total 138, HDL 31, LDL 89. ASCVD risk of 14.5%   . LBBB (left bundle branch block)   . Menorrhagia   . Migraine    Normal CT head (08/06/2006)  . Migraine without aura 09/02/2006   Fairly Well-controlled  2 per month  Takes Elitriptan   . 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 Perimenopausal menorrhagia 02/20/2012  . Psoriasis 04/29/2013  . Right low back pain 03/02/2014  . Routine adult health maintenance 09/27/2010  . Swelling of abdominal wall 03/02/2014  . Swelling of abdominal wall 03/02/2014  . Symptoms of upper respiratory infection (URI) 04/01/2018  . Viral URI with cough 01/13/2013      ROS:  Constitutional: Positive for subjective fevers and generalized weakness. Negative for chills.  Respiratory: Negative for shortness of breath.   Cardiovascular: Negative for chest pain and leg swelling.  Gastrointestinal: Negative for abdominal pain and vomiting. Positive for nausea.  Neurological: Negative for dizziness and headaches.    Assessment / Plan / Recommendations:   Please see A&P under problem oriented charting for  assessment of the patient's acute and chronic medical conditions.   As always, pt is advised that if symptoms worsen or new symptoms arise, they should go to an urgent care facility or to to ER for  further evaluation.   Consent and Medical Decision Making:   Patient discussed with Dr. Philipp Ovens  This is a telephone encounter between Sandra [name] and Asencion Noble on 09/08/2019 for diarrhea and generalized weakness following COVID infection. The visit was conducted with the patient located at home and Asencion Noble at [facility]. The patient's identity was confirmed using their DOB and current address. The patient has consented to being evaluated through a telephone encounter and understands the associated risks (an examination cannot be done and the patient may need to come in for an appointment) / benefits (allows the patient to remain at home, decreasing exposure to coronavirus). I personally spent 10 minutes on medical discussion.

## 2019-09-08 NOTE — Telephone Encounter (Signed)
Return call to pt who was tested COVID + on 8/10. States she's very weak, has diarrhea and nausea. Denies sob. T- 99.7 States she's trying to drink fluids. And has not taken anything for the diarrhea. Teleheath appt scheduled this am @ 1015 AM with Dr Sherry Ruffing - pt is agreeable.

## 2019-09-08 NOTE — Telephone Encounter (Signed)
Called to discuss with Sandra [name] about Covid symptoms and the use of casirivimab/imdevimab, a combination monoclonal antibody infusion for those with mild to moderate Covid symptoms and at a high risk of hospitalization.     Pt is qualified for this infusion at the [facility] infusion center due to co-morbid conditions (BMI > 25, hypertension, CAD). Referral by Dr. Sherry Ruffing confirming positive test on 8/10 asymptomatic, however patient now with symptom onset of 09/06/19 (body aches, diarrhea).   Unable to reach. Voicemail left and Mychart message sent.    Patient Active Problem List   Diagnosis Date Noted  . COVID-19 09/08/2019  . 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 Right low back pain 03/02/2014  . Psoriasis 04/29/2013  . Adenomyosis 06/03/2012  . Obesity 05/16/2012  . Chronic HFrEF (heart failure with reduced ejection fraction) ([place]) 02/22/2012  . Perimenopausal menorrhagia 02/20/2012  . LBBB (left bundle branch block) 01/24/2012  . Routine adult health maintenance 09/27/2010  . Diabetes mellitus type 2 in obese ([place]) 11/17/2009  . Hyperlipidemia 08/20/2007  . Migraine without aura 09/02/2006  . Essential hypertension 07/08/2006  . HSV 11/07/2005  . Asthma 11/07/2005    Alda Lea, AGPCNP-BC

## 2019-09-08 NOTE — Telephone Encounter (Signed)
Pt states she is feeling weak after being tested positive for the COVID, pleases call pt back.

## 2019-09-08 NOTE — Progress Notes (Signed)
I connected by phone with Sandra [name] on 09/08/2019 at 11:58 AM to discuss the potential use of a new treatment for mild to moderate COVID-19 viral infection in non-hospitalized patients.  This patient is a 54 y.o. female that meets the FDA criteria for Emergency Use Authorization of COVID monoclonal antibody casirivimab/imdevimab.  Has a (+) direct SARS-CoV-2 viral test result  Has mild or moderate COVID-19   Is NOT hospitalized due to COVID-19  Is within 10 days of symptom onset  Has at least one of the high risk factor(s) for progression to severe COVID-19 and/or hospitalization as defined in EUA.  Specific high risk criteria : Cardiovascular disease or hypertension   I have spoken and communicated the following to the patient or parent/caregiver regarding COVID monoclonal antibody treatment:  1. FDA has authorized the emergency use for the treatment of mild to moderate COVID-19 in adults and pediatric patients with positive results of direct SARS-CoV-2 viral testing who are 26 years of age and older weighing at least 40 kg, and who are at high risk for progressing to severe COVID-19 and/or hospitalization.  2. The significant known and potential risks and benefits of COVID monoclonal antibody, and the extent to which such potential risks and benefits are unknown.  3. Information on available alternative treatments and the risks and benefits of those alternatives, including clinical trials.  4. Patients treated with COVID monoclonal antibody should continue to self-isolate and use infection control measures (e.g., wear mask, isolate, social distance, avoid sharing personal items, clean and disinfect "high touch" surfaces, and frequent handwashing) according to CDC guidelines.   5. The patient or parent/caregiver has the option to accept or refuse COVID monoclonal antibody treatment.  After reviewing this information with the patient, The patient agreed to proceed with receiving  casirivimab\imdevimab infusion and will be provided a copy of the Fact sheet prior to receiving the infusion. Chesley Noon Pickenpack-Cousar 09/08/2019 11:58 AM

## 2019-09-08 NOTE — Telephone Encounter (Signed)
Called to discuss with Sandra [name] about Covid symptoms and the use of casirivimab/imdevimab, a combination monoclonal antibody infusion for those with mild to moderate Covid symptoms and at a high risk of hospitalization.     Pt is qualified for this infusion at the [facility] infusion center due to co-morbid conditions (asthma, CAD, hypertension). Sx onset reported as 09/01/19  Per patient with body aches, fever, cough, congestion, and diarrhea).   Patient verbalized understanding of infusion and appointment. Scheduled for 09/09/19 @  0830.    Patient Active Problem List   Diagnosis Date Noted  . COVID-19 09/08/2019  . 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 Right low back pain 03/02/2014  . Psoriasis 04/29/2013  . Adenomyosis 06/03/2012  . Obesity 05/16/2012  . Chronic HFrEF (heart failure with reduced ejection fraction) ([place]) 02/22/2012  . Perimenopausal menorrhagia 02/20/2012  . LBBB (left bundle branch block) 01/24/2012  . Routine adult health maintenance 09/27/2010  . Diabetes mellitus type 2 in obese ([place]) 11/17/2009  . Hyperlipidemia 08/20/2007  . Migraine without aura 09/02/2006  . Essential hypertension 07/08/2006  . HSV 11/07/2005  . Asthma 11/07/2005    Alda Lea, AGPCNP-BC

## 2019-09-08 NOTE — Assessment & Plan Note (Signed)
This is a 54 year old female with a history of asthma, diabetes, hypertension, nonischemic cardiomyopathy, HFrEF, and hyperlipidemia who was recently diagnosed with COVID 19 on 8/10 who reports that she has been having generalized weakness for the past 1 to 2 days and loose stools.  She reports she had about 3 soft bowel movements today and 2 bowel movements yesterday, reports they were very watery, denied any bloody stools or black stools.  She also endorses nausea, subjective fevers, and decreased appetite. She reports that her appetite was doing fine until yesterday, then she couldn't eat much. She reports that she previously had body aches for a few days however states that that has resolved.  She denies any shortness of breath, chest pain, palpitations, vomiting, abdominal pain, anuria, falls, or joint pains.  Denies any changes in smell. She reports that she was not having symptoms when she got the test done, she just got it done because she was going out of town.  She does have history of uncontrolled diabetes and has not been checking her blood sugars at home because how she has been feeling.  Assessment and Plan: Given her comorbidities including asthma, diabetes, hypertension, nonischemic cardiomyopathy, HFrEF, and hyperlipidemia in the mild symptoms that she has been having she does qualify for outpatient monoclonal antibody treatment.  Discussed this with patient and contacted [facility] for monoclonal antibody treatment.

## 2019-09-09 ENCOUNTER — Ambulatory Visit (HOSPITAL_COMMUNITY)
Admission: RE | Admit: 2019-09-09 | Discharge: 2019-09-09 | Disposition: A | Discharge status: Home / Self Care | Payer: BC Managed Care – PPO | Source: Ambulatory Visit | Attending: Pulmonary Disease | Admitting: Pulmonary Disease

## 2019-09-09 DIAGNOSIS — I420 Dilated cardiomyopathy: Secondary | ICD-10-CM

## 2019-09-09 DIAGNOSIS — E1169 Type 2 diabetes mellitus with other specified complication: Secondary | ICD-10-CM | POA: Diagnosis present

## 2019-09-09 DIAGNOSIS — E669 Obesity, unspecified: Secondary | ICD-10-CM | POA: Insufficient documentation

## 2019-09-09 DIAGNOSIS — U071 COVID-19: Secondary | ICD-10-CM | POA: Insufficient documentation

## 2019-09-09 DIAGNOSIS — I10 Essential (primary) hypertension: Secondary | ICD-10-CM | POA: Diagnosis present

## 2019-09-09 MED ORDER — DIPHENHYDRAMINE HCL 50 MG/ML IJ SOLN: 50.0000 mg | Freq: Once | INTRAMUSCULAR | Status: DC | PRN

## 2019-09-09 MED ORDER — ALBUTEROL SULFATE HFA 108 (90 BASE) MCG/ACT IN AERS: 2.0000 | INHALATION_SPRAY | Freq: Once | RESPIRATORY_TRACT | Status: DC | PRN

## 2019-09-09 MED ORDER — SODIUM CHLORIDE 0.9 % IV SOLN
1200.0000 mg | Freq: Once | INTRAVENOUS | Status: AC
  Administered 2019-09-09: 1200 mg via INTRAVENOUS
  Filled 2019-09-09: qty 10

## 2019-09-09 MED ORDER — SODIUM CHLORIDE 0.9 % IV SOLN: INTRAVENOUS | Status: DC | PRN

## 2019-09-09 MED ORDER — METHYLPREDNISOLONE SODIUM SUCC 125 MG IJ SOLR: 125.0000 mg | Freq: Once | INTRAMUSCULAR | Status: DC | PRN

## 2019-09-09 MED ORDER — EPINEPHRINE 0.3 MG/0.3ML IJ SOAJ: 0.3000 mg | Freq: Once | INTRAMUSCULAR | Status: DC | PRN

## 2019-09-09 MED ORDER — FAMOTIDINE IN NACL 20-0.9 MG/50ML-% IV SOLN: 20.0000 mg | Freq: Once | INTRAVENOUS | Status: DC | PRN

## 2019-09-09 NOTE — Discharge Instructions (Signed)

## 2019-09-09 NOTE — Progress Notes (Signed)
  Diagnosis: COVID-19  Physician: Dr. Joya Gaskins   Procedure: [facility]fusion [facility] Med: casirivimab\imdevimab infusion - Provided patient with casirivimab\imdevimab fact sheet for patients, parents and caregivers prior to infusion.  Complications: No immediate complications noted.  Discharge: Discharged home   Kayln Desanctis 09/09/2019

## 2019-09-11 ENCOUNTER — Telehealth: Payer: Self-pay | Admitting: *Deleted

## 2019-09-11 NOTE — Telephone Encounter (Addendum)
Patient called in stating she is still not feeling well 2/2 Covid. C/o nausea and weakness, T 99.7. Feels she has had no improvement following infusion 2 days ago. Encouraged to rest and drink plenty of fluids as dehydration can cause weakness. States she's trying to drink 4 bottles of water per day. Had tele appt with [facility] on 09/08/2019. Will forward to [employer] to advise. Hubbard Hartshorn, BSN, RN-BC

## 2019-09-11 NOTE — Telephone Encounter (Signed)
Contacted patient regarding her nausea, and weakness.  She reports that she started feeling little better after the infusion however still having symptoms.  Her highest temperature was 99.7.  She denies any chest pain, shortness of breath, anuria, or difficulty eating.  She has been drinking about 4 bottles of water per day.  We discussed the indications of coming into the ED including severe shortness of breath, chest pain, falls, and decreased urination.  Advised to increase her fluid intake and to start eating a healthy diet with fruits and vegetables.  Provided reassurance and advised that this can be a long recovery process.  She expressed understanding.

## 2019-09-12 ENCOUNTER — Emergency Department (HOSPITAL_COMMUNITY): Payer: BC Managed Care – PPO

## 2019-09-12 ENCOUNTER — Emergency Department (HOSPITAL_COMMUNITY)
Admission: EM | Admit: 2019-09-12 | Discharge: 2019-09-12 | Disposition: A | Discharge status: Home / Self Care | Payer: BC Managed Care – PPO | Attending: Emergency Medicine | Admitting: Emergency Medicine

## 2019-09-12 ENCOUNTER — Other Ambulatory Visit: Payer: Self-pay

## 2019-09-12 ENCOUNTER — Encounter (HOSPITAL_COMMUNITY): Payer: Self-pay | Admitting: Emergency Medicine

## 2019-09-12 DIAGNOSIS — R531 Weakness: Secondary | ICD-10-CM | POA: Diagnosis present

## 2019-09-12 DIAGNOSIS — Z79899 Other long term (current) drug therapy: Secondary | ICD-10-CM | POA: Insufficient documentation

## 2019-09-12 DIAGNOSIS — R112 Nausea with vomiting, unspecified: Secondary | ICD-10-CM | POA: Insufficient documentation

## 2019-09-12 DIAGNOSIS — E119 Type 2 diabetes mellitus without complications: Secondary | ICD-10-CM | POA: Diagnosis not present

## 2019-09-12 DIAGNOSIS — J45909 Unspecified asthma, uncomplicated: Secondary | ICD-10-CM | POA: Insufficient documentation

## 2019-09-12 DIAGNOSIS — U071 COVID-19: Secondary | ICD-10-CM | POA: Insufficient documentation

## 2019-09-12 DIAGNOSIS — R197 Diarrhea, unspecified: Secondary | ICD-10-CM | POA: Insufficient documentation

## 2019-09-12 DIAGNOSIS — Z794 Long term (current) use of insulin: Secondary | ICD-10-CM | POA: Insufficient documentation

## 2019-09-12 DIAGNOSIS — I110 Hypertensive heart disease with heart failure: Secondary | ICD-10-CM | POA: Insufficient documentation

## 2019-09-12 DIAGNOSIS — I509 Heart failure, unspecified: Secondary | ICD-10-CM | POA: Diagnosis not present

## 2019-09-12 LAB — URINALYSIS, ROUTINE W REFLEX MICROSCOPIC
Bilirubin Urine: NEGATIVE
Glucose, UA: >=500 mg/dL — AB
Hgb urine dipstick: NEGATIVE
Ketones, ur: NEGATIVE mg/dL
Leukocytes,Ua: NEGATIVE
Nitrite: NEGATIVE
Protein, ur: NEGATIVE mg/dL
Specific Gravity, Urine: 1.014 (ref 1.005–1.030)
pH: 6 (ref 5.0–8.0)

## 2019-09-12 LAB — CBC
HCT: 37.2 % (ref 36.0–46.0)
Hemoglobin: 12.3 g/dL (ref 12.0–15.0)
MCH: 32.3 pg (ref 26.0–34.0)
MCHC: 33.1 g/dL (ref 30.0–36.0)
MCV: 97.6 fL (ref 80.0–100.0)
Platelets: 372 10*3/uL (ref 150–400)
RBC: 3.81 MIL/uL — ABNORMAL LOW (ref 3.87–5.11)
RDW: 11.6 % (ref 11.5–15.5)
WBC: 7.4 10*3/uL (ref 4.0–10.5)
nRBC: 0 % (ref 0.0–0.2)

## 2019-09-12 LAB — BASIC METABOLIC PANEL
Anion gap: 12 (ref 5–15)
BUN: 13 mg/dL (ref 6–20)
CO2: 23 mmol/L (ref 22–32)
Calcium: 9.2 mg/dL (ref 8.9–10.3)
Chloride: 100 mmol/L (ref 98–111)
Creatinine, Ser: 0.8 mg/dL (ref 0.44–1.00)
GFR calc Af Amer: >60 mL/min (ref >60)
GFR calc non Af Amer: >60 mL/min (ref >60)
Glucose, Bld: 269 mg/dL — ABNORMAL HIGH (ref 70–99)
Potassium: 4.3 mmol/L (ref 3.5–5.1)
Sodium: 135 mmol/L (ref 135–145)

## 2019-09-12 LAB — CBG MONITORING, ED
Glucose-Capillary: 188 mg/dL — ABNORMAL HIGH (ref 70–99)
Glucose-Capillary: 210 mg/dL — ABNORMAL HIGH (ref 70–99)

## 2019-09-12 MED ORDER — SODIUM CHLORIDE 0.9 % IV BOLUS
250.0000 mL | Freq: Once | INTRAVENOUS | Status: AC
  Administered 2019-09-12: 250 mL via INTRAVENOUS

## 2019-09-12 MED ORDER — ONDANSETRON HCL 4 MG/2ML IJ SOLN
4.0000 mg | Freq: Once | INTRAMUSCULAR | Status: AC
  Administered 2019-09-12: 4 mg via INTRAVENOUS
  Filled 2019-09-12: qty 2

## 2019-09-12 MED ORDER — SODIUM CHLORIDE 0.9 % IV BOLUS
500.0000 mL | Freq: Once | INTRAVENOUS | Status: AC
  Administered 2019-09-12: 500 mL via INTRAVENOUS

## 2019-09-12 NOTE — ED Notes (Addendum)
Pt ambulated in room with pulse ox, O2 is 97 and 98 the entire time

## 2019-09-12 NOTE — ED Triage Notes (Signed)
Pt states she was diagnosed with COVID 8/11.  Reports generalized weakness x 1 week.

## 2019-09-12 NOTE — ED Notes (Signed)
Pt able to use bsc in room with no difficulty

## 2019-09-12 NOTE — Discharge Instructions (Addendum)
Your work-up today was overall reassuring.  Please make sure to continue drinking plenty of fluids, and eating a healthy diet.  You may take over-the-counter cold and flu medications.  You may buy an over-the-counter pulse oximeter, if your oxygen saturations consistently drop below 90%, please return to the ER.  Please make sure to follow up with your primary care doctor.  Return to the ER for any worsening symptoms.

## 2019-09-12 NOTE — ED Provider Notes (Addendum)
Signout received at shift change from [facility].  Please see her note for full HPI.  In short, 54 year old female with heart failure with an EF of 30 to 35%, DM type II, hyperlipidemia, asthma presents to the ER with weakness, feeling dehydrated, and overall feeling unwell in the setting of COVID-19 diagnosis, testing positive on 8/11.  She had received the monoclonal antibody on the 18th.  She reports mild improvement in her symptoms after the infusion, but it is still endorsing nausea and weakness.  Presents to the ER with these complaints.  Patient's work-up included lab work, BMP and CBC without any acute abnormalities.  UA without evidence of UTI.  She did have significant glycosuria with levels of more than 500.  Glucose originally 269. Had not taken home meds. EKG consistent with sinus tach but no evidence of ischemia.  Chest x-ray with patchy opacities at the lung bases right more than left consistent with COVID-19 diagnosis.  Patient had been ambulated in the ED and saturations remain consistently above 95%.  Saturations even at rest maintained above 94%.  Patient continued to complain of weakness, and was started on gentle rehydration with 250 cc fluid boluses.  Received care pending reevaluation.  Patient continues to endorse weakness.  I explained to the patient that we will do another 250 cc bolus of fluids, however will not continue with more fluids given her history of CHF.  She voices understanding and is agreeable.  Does not complain of any chest pain or shortness of breath, remains with good oxygen saturations. No evidence of CHF exacerbation. Although the patient remains slightly tachycardic, concern for PE is low as the patient with no SOB and not hypoxic.  Repeat CBG of 180 2:08 150 cc of fluid. Tolerated PO fluid and graham crackers well.   Patient will be discharged with supportive measures.  Patient instructed to return to the ER if her oxygen saturations dropped below 90%.  She was  encouraged to buy home pulse ox monitor.  She voices understanding and is agreeable. Will hold home Zofran given QTC of 507.   At this stage in the ED course, the patient medically screened and stable for discharge. Physical Exam  BP 117/62   Pulse (!) 113   Temp 98.8 F (37.1 C) (Oral)   Resp 20   Ht 5' (1.524 m)   Wt 90.7 kg   SpO2 94%   BMI 39.06 kg/m   Physical Exam Vitals and nursing note reviewed.  Constitutional:      General: She is not in acute distress.    Appearance: She is well-developed. She is obese. She is not ill-appearing, toxic-appearing or diaphoretic.  HENT:     Head: Normocephalic and atraumatic.     Mouth/Throat:     Mouth: Mucous membranes are moist.     Pharynx: Oropharynx is clear.  Eyes:     Conjunctiva/sclera: Conjunctivae normal.  Cardiovascular:     Rate and Rhythm: Normal rate and regular rhythm.     Pulses: Normal pulses.     Heart sounds: Normal heart sounds. No murmur heard.   Pulmonary:     Effort: Pulmonary effort is normal. No respiratory distress.     Breath sounds: Normal breath sounds.  Abdominal:     General: Abdomen is flat.     Palpations: Abdomen is soft.     Tenderness: There is no abdominal tenderness.  Musculoskeletal:        General: No tenderness. Normal range of motion.  Cervical back: Neck supple.     Right lower leg: No edema.     Left lower leg: No edema.  Skin:    General: Skin is warm and dry.     Findings: No erythema or rash.  Neurological:     General: No focal deficit present.     Mental Status: She is alert and oriented to person, place, and time.     Sensory: No sensory deficit.     Motor: No weakness.  Psychiatric:        Mood and Affect: Mood normal.        Behavior: Behavior normal.     ED Course/Procedures    Results for orders placed or performed during the hospital encounter of 94/49/67  Basic metabolic panel  Result Value Ref Range   Sodium 135 135 - 145 mmol/L   Potassium 4.3 3.5 -  5.1 mmol/L   Chloride 100 98 - 111 mmol/L   CO2 23 22 - 32 mmol/L   Glucose, Bld 269 (H) 70 - 99 mg/dL   BUN 13 6 - 20 mg/dL   Creatinine, Ser 0.80 0.44 - 1.00 mg/dL   Calcium 9.2 8.9 - 10.3 mg/dL   GFR calc non Af Amer >60 >60 mL/min   GFR calc Af Amer >60 >60 mL/min   Anion gap 12 5 - 15  CBC  Result Value Ref Range   WBC 7.4 4.0 - 10.5 K/uL   RBC 3.81 (L) 3.87 - 5.11 MIL/uL   Hemoglobin 12.3 12.0 - 15.0 g/dL   HCT 37.2 36 - 46 %   MCV 97.6 80.0 - 100.0 fL   MCH 32.3 26.0 - 34.0 pg   MCHC 33.1 30.0 - 36.0 g/dL   RDW 11.6 11.5 - 15.5 %   Platelets 372 150 - 400 K/uL   nRBC 0.0 0.0 - 0.2 %  Urinalysis, Routine w reflex microscopic Urine, Clean Catch  Result Value Ref Range   Color, Urine STRAW (A) YELLOW   APPearance CLEAR CLEAR   Specific Gravity, Urine 1.014 1.005 - 1.030   pH 6.0 5.0 - 8.0   Glucose, UA >=500 (A) NEGATIVE mg/dL   Hgb urine dipstick NEGATIVE NEGATIVE   Bilirubin Urine NEGATIVE NEGATIVE   Ketones, ur NEGATIVE NEGATIVE mg/dL   Protein, ur NEGATIVE NEGATIVE mg/dL   Nitrite NEGATIVE NEGATIVE   Leukocytes,Ua NEGATIVE NEGATIVE   WBC, UA 0-5 0 - 5 WBC/hpf   Bacteria, UA RARE (A) NONE SEEN   Squamous Epithelial / LPF 0-5 0 - 5   Mucus PRESENT   POC CBG, ED  Result Value Ref Range   Glucose-Capillary 188 (H) 70 - 99 mg/dL   Comment 1 Notify RN    DG Chest Port 1 View  Result Date: 09/12/2019 CLINICAL DATA:  COVID-19 infection with weakness EXAM: PORTABLE CHEST 1 VIEW COMPARISON:  July 03, 2019 FINDINGS: Cardiomediastinal contours are stable. Patchy opacities at the lung bases RIGHT greater than LEFT. No lobar level consolidation or sign of pleural effusion. No acute skeletal process on limited assessment. IMPRESSION: Patchy opacities at the lung bases RIGHT greater than LEFT, in this patient with history of COVID-19 infection. No lobar level consolidation or sign of effusion. Electronically Signed   By: Zetta Bills M.D.   On: 09/12/2019 14:49     Procedures  MDM         Garald Balding, PA-C 09/12/19 1851    Blanchie Dessert, MD 09/12/19 2030

## 2019-09-12 NOTE — ED Provider Notes (Signed)
[facility] Provider Note   CSN: [id number] Arrival date & time: 09/12/19  0350     History Chief Complaint  Patient presents with  . COVID  . Weakness    Sandra [name] is a 54 y.o. female.  Sandra [name] is a 54 y.o. female with history of hypertension, dilated cardiomyopathy with EF of 30-35%, HLD, DM, Asthma, and migraine, who presents with generalized weakness and malaise in setting of COVID infection, Patient was diagnosed with COVID on 8/11, with symptoms beginning on 8/10.  Usually patient complained primarily of cough, low-grade fever and fatigue, but she states that for the past week she has been having worsening generalized weakness associated with nausea, vomiting and diarrhea.  Patient is not having any focal abdominal pain.  States she has not had much shortness of breath and denies chest pain.  She has not been using any medications currently to treat the symptoms.  Patient had monoclonal antibody infusion on 8/18 and states she felt slight improvement in her symptoms after that but then vomiting and diarrhea worsened.  She states she has had no appetite and has not been able to keep down much food or drink.  No other aggravating or alleviating factors.        Past Medical History:  Diagnosis Date  . Acute pain of left shoulder 06/07/2017  . Asthma    as a child  . Asthma 11/07/2005   Reported Diagnosis in 2012, no formal spirometry   . Cardiomyopathy ([place])    likely nonischemic DCM per Dr. Johnsie Cancel note 04/2017  . Carpal tunnel syndrome   . Chest pain 07/11/2018  . Chronic HFrEF (heart failure with reduced ejection fraction) ([place]) 02/22/2012  . Diabetes mellitus type 2 in obese ([place]) 11/17/2009   - Synjardy 05-998 BID, Januvia 100mg  Daily,  Basaglar - Failed to tolerate Victoza  . Diabetes mellitus type 2, uncontrolled ([facility]) DX: 1999   Started as gestational diabetes  . ESSENTIAL HYPERTENSION 07/08/2006  . Essential  hypertension 07/08/2006  . 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 Gallstones   . HSV 11/07/2005   Qualifier: Diagnosis of  By: Pearline Cables MD, Belenda Cruise    . HSV (herpes simplex virus) anogenital infection   . HYPERLIPIDEMIA 08/20/2007  . Hyperlipidemia 08/20/2007   Atorvastatin 40mg  Daily Last Lipid panel may 2019: Total 138, HDL 31, LDL 89. ASCVD risk of 14.5%   . LBBB (left bundle branch block)   . Menorrhagia   . Migraine    Normal CT head (08/06/2006)  . Migraine without aura 09/02/2006   Fairly Well-controlled  2 per month  Takes Elitriptan   . 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 Perimenopausal menorrhagia 02/20/2012  . Psoriasis 04/29/2013  . Right low back pain 03/02/2014  . Routine adult health maintenance 09/27/2010  . Swelling of abdominal wall 03/02/2014  . Swelling of abdominal wall 03/02/2014  . Symptoms of upper respiratory infection (URI) 04/01/2018  . Viral URI with cough 01/13/2013    Patient Active Problem List   Diagnosis Date Noted  . COVID-19 09/08/2019  . 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 Nonischemic dilated cardiomyopathy ([place]) 03/20/2017  . Right low back pain 03/02/2014  . Psoriasis 04/29/2013  . Adenomyosis 06/03/2012  . Obesity 05/16/2012  . Chronic HFrEF (heart failure with reduced ejection fraction) ([place]) 02/22/2012  . Perimenopausal menorrhagia 02/20/2012  . LBBB (left bundle branch block) 01/24/2012  . Routine adult health maintenance 09/27/2010  .  Diabetes mellitus type 2 in obese ([place]) 11/17/2009  . Hyperlipidemia 08/20/2007  . Migraine without aura 09/02/2006  . Essential hypertension 07/08/2006  . HSV 11/07/2005  . Asthma 11/07/2005    Past Surgical History:  Procedure Laterality Date  . CHOLECYSTECTOMY   2009  . DILATATION & CURETTAGE/HYSTEROSCOPY WITH MYOSURE N/A 12/25/2017   Procedure: DILATATION & CURETTAGE/HYSTEROSCOPY   POLYPECTOMY WITH MYOSURE;  Surgeon: Charyl Bigger, MD;  Location: [facility];  Service: Gynecology;  Laterality: N/A;  . TUBAL LIGATION       OB History    Gravida  2   Para  1   Term  1   Preterm      AB  1   Living  1     SAB      TAB  1   Ectopic      Multiple      Live Births              Family History  Problem Relation Age of Onset  . Hypertension Father   . Diabetes Father     Social History   Tobacco Use  . Smoking status: Never Smoker  . Smokeless tobacco: Never Used  Vaping Use  . Vaping Use: Never used  Substance Use Topics  . Alcohol use: Yes    Alcohol/week: 0.0 standard drinks    Comment: occ  . Drug use: No    Home Medications Prior to Admission medications   Medication Sig Start Date End Date Taking? Authorizing Provider  aspirin EC 81 MG tablet Take 81 mg by mouth daily. Swallow whole.    [provider]  atorvastatin (LIPITOR) 40 MG tablet Take 1 tablet by mouth once daily 09/04/19   Andrew Au, MD  BIOTIN PO Take 500 mg by mouth daily.    [provider]  CINNAMON PO Take 1,000 mg by mouth 2 (two) times daily.    [provider]  diphenhydrAMINE (BENADRYL) 25 MG tablet Take 2 tablets (50 mg total) by mouth every 8 (eight) hours as needed for up to 14 days (Nausea). 10/16/18 07/03/19  [name], MD  eletriptan (RELPAX) 20 MG tablet TAKE 1 TABLET BY MOUTH AS NEEDED FOR  MIGRAINE  OR  HEADACHE.  MAY  REPEAT  IN  2  HOURS  IF  HEADACHE  PERSISTS  OR  RECURS 04/30/18   [name], MD  Empagliflozin-metFORMIN HCl ER (SYNJARDY XR) 05-998 MG TB24 Take 1 tablet by mouth 2 (two) times daily with a meal. 09/02/19   Madalyn Rob, MD  guaiFENesin-codeine [facility]) 100-10 MG/5ML syrup Take 10 mLs by mouth at bedtime as needed for cough. 12/12/17   Asencion Noble, MD  ibuprofen  (ADVIL) 800 MG tablet Take 800 mg by mouth every 6 (six) hours as needed. 05/01/19   [provider]  Insulin Glargine (BASAGLAR KWIKPEN) 100 UNIT/ML SOPN Inject 0.08 mLs (8 Units total) into the skin daily. 12/04/18   [name], MD  Insulin Pen Needle (B-D UF III MINI PEN NEEDLES) 31G X 5 MM MISC 1 Stick by Does not apply route daily. 11.69, insulin requirement 11/05/18   [name], MD  JANUVIA 100 MG tablet Take 1 tablet by mouth once daily 06/30/19   [name], MD  methocarbamol (ROBAXIN) 500 MG tablet Take 1 tablet (500 mg total) by mouth 2 (two) times daily. 07/03/19   Tedd Sias, PA  metoprolol succinate (TOPROL-XL) 200 MG 24 hr tablet Take 1  tablet (200 mg total) by mouth daily. Take with or immediately following a meal. 03/18/19 03/12/20  Josue Hector, MD  ondansetron (ZOFRAN) 4 MG tablet Take 1 tablet (4 mg total) by mouth daily as needed for up to 10 doses for nausea or vomiting. 11/05/18   [name], MD  pantoprazole (PROTONIX) 20 MG tablet Take 1 tablet by mouth once daily 08/18/19   [place], Carley D, DO  sacubitril-valsartan (ENTRESTO) 97-103 MG Take 1 tablet by mouth 2 (two) times daily. 02/11/19   Josue Hector, MD  spironolactone (ALDACTONE) 25 MG tablet Take 1 tablet (25 mg total) by mouth daily. 04/20/19 04/19/20  Josue Hector, MD    Allergies    Patient has no known allergies.  Review of Systems    Review of Systems  Constitutional: Positive for fatigue. Negative for chills and fever.  HENT: Negative.   Eyes: Negative for visual disturbance.  Respiratory: Positive for cough. Negative for shortness of breath.   Cardiovascular: Negative for chest pain.  Gastrointestinal: Positive for diarrhea, nausea and vomiting. Negative for abdominal pain.  Genitourinary: Negative for dysuria and frequency.  Musculoskeletal: Negative for arthralgias and myalgias.  Skin: Negative for color change and rash.  Neurological: Positive for  weakness (Generalized). Negative for dizziness.  All other systems reviewed and are negative.   Physical Exam Updated Vital Signs BP 125/68 (BP Location: Right Arm)   Pulse 100   Temp 98.8 F (37.1 C) (Oral)   Resp 16   Ht 5' (1.524 m)   Wt 90.7 kg   SpO2 94%   BMI 39.06 kg/m   Physical Exam Vitals and nursing note reviewed.  Constitutional:      General: She is not in acute distress.    Appearance: Normal appearance. She is well-developed. She is not diaphoretic.     Comments: Somewhat ill-appearing and generally malaised but in no acute distress  HENT:     Head: Normocephalic and atraumatic.     Nose: Nose normal.     Mouth/Throat:     Comments: Mucous membranes slightly dry, posterior oropharynx clear Eyes:     General:        Right eye: No discharge.        Left eye: No discharge.     Pupils: Pupils are equal, round, and reactive to light.  Cardiovascular:     Rate and Rhythm: Regular rhythm. Tachycardia present.     Heart sounds: Normal heart sounds.     Comments: Mild tachycardia with sinus rhythm on arrival Pulmonary:     Effort: Pulmonary effort is normal. No respiratory distress.     Breath sounds: Normal breath sounds. No wheezing or rales.     Comments: Respirations equal and unlabored, patient able to speak in full sentences, lungs clear to auscultation bilaterally Abdominal:     General: Bowel sounds are normal. There is no distension.     Palpations: Abdomen is soft. There is no mass.     Tenderness: There is no abdominal tenderness. There is no guarding.     Comments: Abdomen soft, nondistended, nontender to palpation in all quadrants without guarding or peritoneal signs  Musculoskeletal:        General: No deformity.     Cervical back: Neck supple.  Skin:    General: Skin is warm and dry.     Capillary Refill: Capillary refill takes less than 2 seconds.  Neurological:     Mental Status: She is alert.  Coordination: Coordination normal.      Comments: Speech is clear, able to follow commands CN III-XII intact Normal strength in upper and lower extremities bilaterally including dorsiflexion and plantar flexion, strong and equal grip strength Sensation normal to light and sharp touch Moves extremities without ataxia, coordination intact  Psychiatric:        Mood and Affect: Mood normal.        Behavior: Behavior normal.     ED Results / Procedures / Treatments   Labs (all labs ordered are listed, but only abnormal results are displayed) Labs Reviewed  BASIC METABOLIC PANEL - Abnormal; Notable for the following components:      Result Value   Glucose, Bld 269 (*)    All other components within normal limits  CBC - Abnormal; Notable for the following components:   RBC 3.81 (*)    All other components within normal limits  URINALYSIS, ROUTINE W REFLEX MICROSCOPIC - Abnormal; Notable for the following components:   Color, Urine STRAW (*)    Glucose, UA >=500 (*)    Bacteria, UA RARE (*)    All other components within normal limits    EKG EKG Interpretation  Date/Time:  Saturday September 12 2019 09:45:44 EDT Ventricular Rate:  112 PR Interval:  148 QRS Duration: 124 QT Interval:  372 QTC Calculation: 507 R Axis:   61 Text Interpretation: Sinus tachycardia Non-specific intra-ventricular conduction delay ST & T wave abnormality, consider inferolateral ischemia Abnormal ECG When compared to prior,  similar apperance to ECG in 2020. NO STEMI Confirmed by Antony Blackbird [phone]) on 09/12/2019 1:36:16 PM   Radiology DG Chest Port 1 View  Result Date: 09/12/2019 CLINICAL DATA:  COVID-19 infection with weakness EXAM: PORTABLE CHEST 1 VIEW COMPARISON:  July 03, 2019 FINDINGS: Cardiomediastinal contours are stable. Patchy opacities at the lung bases RIGHT greater than LEFT. No lobar level consolidation or sign of pleural effusion. No acute skeletal process on limited assessment. IMPRESSION: Patchy opacities at the lung bases  RIGHT greater than LEFT, in this patient with history of COVID-19 infection. No lobar level consolidation or sign of effusion. Electronically Signed   By: Zetta Bills M.D.   On: 09/12/2019 14:49    Procedures Procedures (including critical care time)  Medications Ordered in ED Medications  sodium chloride 0.9 % bolus 500 mL (500 mLs Intravenous New Bag/Given 09/12/19 1457)  ondansetron (ZOFRAN) injection 4 mg (4 mg Intravenous Given 09/12/19 1458)    ED Course  I have reviewed the triage vital signs and the nursing notes.  Pertinent labs & imaging results that were available during my care of the patient were reviewed by me and considered in my medical decision making (see chart for details).    MDM Rules/Calculators/A&P                         54 year old female presenting with generalized weakness and malaise with associated nausea and diarrhea, currently on day 12 of COVID-19 infection.  Received monoclonal antibodies on 8/18 reports mild improvement in symptoms.  Initially had primary cough but now is having more persistent GI symptoms with difficulty keeping down food and drink having multiple bowel movements a day and a few episodes of vomiting with persistent nausea.  On arrival she appears generally malaised but is in no acute distress.  She is mildly tachycardic but all other vitals are stable.  She denies chest pain or shortness of breath and lungs are clear. Patient  ambulated in the room and maintained pulse ox greater than 95% and at rest she has maintained O2 sats greater than 94%.  Abdomen is soft and nontender.  Clinically patient appears dehydrated, she does have history of heart failure with EF of 30-35%, so will order gentle IV fluids as well as basic labs, UA, chest x-ray and EKG.  EKG with sinus tachycardia with no concerning changes when compared to prior.  Chest x-ray with patchy opacities at lung bases right worse than left consistent with Covid pneumonia.   Lab work  with no leukocytosis, hemoglobin, glucose of 269 but no other electrolyte derangements and normal renal function.  Urinalysis without evidence of infection.  Patient IV fluids have just been started, at shift change care signed out to [place] who will reassess after IV fluids and PO challenge, anticipate DC home with continued symptomatic treatment.  Final Clinical Impression(s) / ED Diagnoses Final diagnoses:  COVID-19  Weakness  Diarrhea, unspecified type  Non-intractable vomiting with nausea, unspecified vomiting type    Rx / DC Orders ED Discharge Orders    None       Jacqlyn Larsen, [place] 09/13/19 1559    Tegeler, Gwenyth Allegra, MD 09/13/19 [phone]

## 2019-09-12 NOTE — ED Notes (Signed)
Reviewed D/C instructions and follow-up care w/ pt. Pt verbalized understanding. Pt A&Ox4, VSS, ambulatory w/ steady gait upon departure.

## 2019-09-14 ENCOUNTER — Emergency Department (HOSPITAL_COMMUNITY)
Admission: EM | Admit: 2019-09-14 | Discharge: 2019-09-14 | Disposition: A | Discharge status: Home / Self Care | Payer: BC Managed Care – PPO | Attending: Emergency Medicine | Admitting: Emergency Medicine

## 2019-09-14 ENCOUNTER — Encounter (HOSPITAL_COMMUNITY): Payer: Self-pay

## 2019-09-14 ENCOUNTER — Emergency Department (HOSPITAL_COMMUNITY): Payer: BC Managed Care – PPO

## 2019-09-14 ENCOUNTER — Other Ambulatory Visit: Payer: Self-pay

## 2019-09-14 DIAGNOSIS — Z7982 Long term (current) use of aspirin: Secondary | ICD-10-CM | POA: Insufficient documentation

## 2019-09-14 DIAGNOSIS — I110 Hypertensive heart disease with heart failure: Secondary | ICD-10-CM | POA: Insufficient documentation

## 2019-09-14 DIAGNOSIS — J45909 Unspecified asthma, uncomplicated: Secondary | ICD-10-CM | POA: Insufficient documentation

## 2019-09-14 DIAGNOSIS — Z794 Long term (current) use of insulin: Secondary | ICD-10-CM | POA: Insufficient documentation

## 2019-09-14 DIAGNOSIS — R0600 Dyspnea, unspecified: Secondary | ICD-10-CM | POA: Diagnosis present

## 2019-09-14 DIAGNOSIS — J1289 Other viral pneumonia: Secondary | ICD-10-CM | POA: Insufficient documentation

## 2019-09-14 DIAGNOSIS — U071 COVID-19: Secondary | ICD-10-CM | POA: Diagnosis not present

## 2019-09-14 DIAGNOSIS — E119 Type 2 diabetes mellitus without complications: Secondary | ICD-10-CM | POA: Diagnosis not present

## 2019-09-14 DIAGNOSIS — I509 Heart failure, unspecified: Secondary | ICD-10-CM | POA: Diagnosis not present

## 2019-09-14 DIAGNOSIS — J1282 Pneumonia due to coronavirus disease 2019: Secondary | ICD-10-CM

## 2019-09-14 LAB — CBC WITH DIFFERENTIAL/PLATELET
Abs Immature Granulocytes: 0.05 10*3/uL (ref 0.00–0.07)
Basophils Absolute: 0 10*3/uL (ref 0.0–0.1)
Basophils Relative: 0 %
Eosinophils Absolute: 0 10*3/uL (ref 0.0–0.5)
Eosinophils Relative: 0 %
HCT: 37.1 % (ref 36.0–46.0)
Hemoglobin: 12.5 g/dL (ref 12.0–15.0)
Immature Granulocytes: 1 %
Lymphocytes Relative: 20 %
Lymphs Abs: 1.5 10*3/uL (ref 0.7–4.0)
MCH: 32.9 pg (ref 26.0–34.0)
MCHC: 33.7 g/dL (ref 30.0–36.0)
MCV: 97.6 fL (ref 80.0–100.0)
Monocytes Absolute: 0.9 10*3/uL (ref 0.1–1.0)
Monocytes Relative: 12 %
Neutro Abs: 5 10*3/uL (ref 1.7–7.7)
Neutrophils Relative %: 67 %
Platelets: 481 10*3/uL — ABNORMAL HIGH (ref 150–400)
RBC: 3.8 MIL/uL — ABNORMAL LOW (ref 3.87–5.11)
RDW: 11.6 % (ref 11.5–15.5)
WBC: 7.4 10*3/uL (ref 4.0–10.5)
nRBC: 0 % (ref 0.0–0.2)

## 2019-09-14 LAB — COMPREHENSIVE METABOLIC PANEL
ALT: 24 U/L (ref 0–44)
AST: 19 U/L (ref 15–41)
Albumin: 3.5 g/dL (ref 3.5–5.0)
Alkaline Phosphatase: 77 U/L (ref 38–126)
Anion gap: 15 (ref 5–15)
BUN: 14 mg/dL (ref 6–20)
CO2: 24 mmol/L (ref 22–32)
Calcium: 10 mg/dL (ref 8.9–10.3)
Chloride: 100 mmol/L (ref 98–111)
Creatinine, Ser: 0.77 mg/dL (ref 0.44–1.00)
GFR calc Af Amer: >60 mL/min (ref >60)
GFR calc non Af Amer: >60 mL/min (ref >60)
Glucose, Bld: 224 mg/dL — ABNORMAL HIGH (ref 70–99)
Potassium: 4.3 mmol/L (ref 3.5–5.1)
Sodium: 139 mmol/L (ref 135–145)
Total Bilirubin: 0.8 mg/dL (ref 0.3–1.2)
Total Protein: 8.2 g/dL — ABNORMAL HIGH (ref 6.5–8.1)

## 2019-09-14 LAB — BRAIN NATRIURETIC PEPTIDE: B Natriuretic Peptide: 84.6 pg/mL (ref 0.0–100.0)

## 2019-09-14 LAB — D-DIMER, QUANTITATIVE: D-Dimer, Quant: 3.06 ug/mL-FEU — ABNORMAL HIGH (ref 0.00–0.50)

## 2019-09-14 MED ORDER — SODIUM CHLORIDE (PF) 0.9 % IJ SOLN
INTRAMUSCULAR | Status: AC
  Filled 2019-09-14: qty 50

## 2019-09-14 MED ORDER — HYDROXYZINE HCL 25 MG PO TABS: 25.0000 mg | ORAL_TABLET | Freq: Four times a day (QID) | ORAL | 0 refills | Status: DC

## 2019-09-14 MED ORDER — IOHEXOL 350 MG/ML SOLN
100.0000 mL | Freq: Once | INTRAVENOUS | Status: AC | PRN
  Administered 2019-09-14: 100 mL via INTRAVENOUS

## 2019-09-14 MED ORDER — ALBUTEROL SULFATE HFA 108 (90 BASE) MCG/ACT IN AERS
2.0000 | INHALATION_SPRAY | Freq: Four times a day (QID) | RESPIRATORY_TRACT | Status: DC
  Administered 2019-09-14: 2 via RESPIRATORY_TRACT
  Filled 2019-09-14: qty 6.7

## 2019-09-14 MED ORDER — ACETAMINOPHEN 500 MG PO TABS
1000.0000 mg | ORAL_TABLET | Freq: Four times a day (QID) | ORAL | Status: AC | PRN
  Administered 2019-09-14: 1000 mg via ORAL
  Filled 2019-09-14: qty 2

## 2019-09-14 NOTE — ED Provider Notes (Signed)
[facility] DEPT Provider Note   CSN: [id number] Arrival date & time: 09/14/19  0559     History Chief Complaint  Patient presents with  . Shortness of Breath    Sandra [name] is a 54 y.o. female.  HPI    Patient presents for the second time in 3 days with concern for dyspnea. Patient has multiple medical problems including cardiomyopathy, and was diagnosed with coronavirus 12 days ago.  She notes that her recovery has been difficult, with episodes of dyspnea.  No new fever, no no syncope, no new vomiting or diarrhea.  No relief when she does have an episode of severe dyspnea, and with this concern she presents for evaluation. She has received monoclonal antibody therapy since her initial diagnosis, notes that it did not change her symptoms substantially. Past Medical History:  Diagnosis Date  . Acute pain of left shoulder 06/07/2017  . Asthma    as a child  . Asthma 11/07/2005   Reported Diagnosis in 2012, no formal spirometry   . Cardiomyopathy ([place])    likely nonischemic DCM per Dr. Johnsie Cancel note 04/2017  . Carpal tunnel syndrome   . Chest pain 07/11/2018  . Chronic HFrEF (heart failure with reduced ejection fraction) ([place]) 02/22/2012  . Diabetes mellitus type 2 in obese ([place]) 11/17/2009   - Synjardy 05-998 BID, Januvia 100mg  Daily,  Basaglar - Failed to tolerate Victoza  . Diabetes mellitus type 2, uncontrolled (HCC) DX: 1999   Started as gestational diabetes  . ESSENTIAL HYPERTENSION 07/08/2006  . Essential hypertension 07/08/2006  . 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 Gallstones   . HSV 11/07/2005   Qualifier: Diagnosis of  By: Pearline Cables MD, Belenda Cruise    . HSV (herpes simplex virus) anogenital infection   .  HYPERLIPIDEMIA 08/20/2007  . Hyperlipidemia 08/20/2007   Atorvastatin 40mg  Daily Last Lipid panel may 2019: Total 138, HDL 31, LDL 89. ASCVD risk of 14.5%   . LBBB (left bundle branch block)   . Menorrhagia   . Migraine    Normal CT head (08/06/2006)  . Migraine without aura 09/02/2006   Fairly Well-controlled  2 per month  Takes Elitriptan   . 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 Perimenopausal menorrhagia 02/20/2012  . Psoriasis 04/29/2013  . Right low back pain 03/02/2014  . Routine adult health maintenance 09/27/2010  . Swelling of abdominal wall 03/02/2014  . Swelling of abdominal wall 03/02/2014  . Symptoms of upper respiratory infection (URI) 04/01/2018  . Viral URI with cough 01/13/2013    Patient Active Problem List   Diagnosis Date Noted  . COVID-19 09/08/2019  . 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 Right low back pain 03/02/2014  . Psoriasis 04/29/2013  . Adenomyosis 06/03/2012  . Obesity 05/16/2012  . Chronic HFrEF (heart failure with reduced ejection fraction) ([place]) 02/22/2012  . Perimenopausal menorrhagia 02/20/2012  . LBBB (left bundle branch block) 01/24/2012  . Routine adult health maintenance 09/27/2010  . Diabetes mellitus type 2 in obese ([place]) 11/17/2009  . Hyperlipidemia 08/20/2007  . Migraine without aura 09/02/2006  . Essential hypertension 07/08/2006  . HSV 11/07/2005  . Asthma 11/07/2005    Past Surgical History:  Procedure Laterality Date  . CHOLECYSTECTOMY  2009  . DILATATION & CURETTAGE/HYSTEROSCOPY WITH MYOSURE N/A 12/25/2017   Procedure: DILATATION & CURETTAGE/HYSTEROSCOPY   POLYPECTOMY WITH MYOSURE;  Surgeon: Charyl Bigger, MD;  Location: [facility];  Service: Gynecology;  Laterality: N/A;  . TUBAL LIGATION       OB History    Gravida  2   Para  1   Term  1   Preterm      AB  1   Living  1     SAB      TAB  1   Ectopic      Multiple      Live Births                Family History  Problem Relation Age of Onset  . Hypertension Father   . Diabetes Father     Social History   Tobacco Use  . Smoking status: Never Smoker  . Smokeless tobacco: Never Used  Vaping Use  . Vaping Use: Never used  Substance Use Topics  . Alcohol use: Yes    Alcohol/week: 0.0 standard drinks    Comment: occ  . Drug use: No    Home Medications Prior to Admission medications   Medication Sig Start Date End Date Taking? Authorizing Provider  aspirin EC 81 MG tablet Take 81 mg by mouth daily. Swallow whole.   Yes [provider]  Aspirin-Acetaminophen-Caffeine (GOODY HEADACHE PO) Take 1 packet by mouth as needed (headache/pain).   Yes [provider]  atorvastatin (LIPITOR) 40 MG tablet Take 1 tablet by mouth once daily 09/04/19  Yes Andrew Au, MD  diphenhydrAMINE-PE-APAP [facility]) 12.5-5-325 MG/15ML LIQD Take 15 mLs by mouth as needed (cold/congestion).   Yes [provider]  Empagliflozin-metFORMIN HCl ER (SYNJARDY XR) 05-998 MG TB24 Take 1 tablet by mouth 2 (two) times daily with a meal. 09/02/19  Yes Madalyn Rob, MD  fluticasone [facility]) 50 MCG/ACT nasal spray Place 1-2 sprays into both nostrils daily as needed for allergies or rhinitis.   Yes [provider]  Insulin Glargine (BASAGLAR KWIKPEN) 100 UNIT/ML SOPN Inject 0.08 mLs (8 Units total) into the skin daily. Patient taking differently: Inject 10 Units into the skin daily.  12/04/18  Yes [name], MD  JANUVIA 100 MG tablet Take 1 tablet by mouth once daily Patient taking differently: Take 100 mg by mouth daily.  06/30/19  Yes [name], MD  menthol-cetylpyridinium (CEPACOL) 3 MG lozenge Take 1 lozenge by mouth as needed for sore throat.   Yes [provider]  metoprolol succinate (TOPROL-XL) 200 MG 24 hr tablet Take 1 tablet (200 mg total) by mouth daily. Take with or immediately following a meal. 03/18/19 03/12/20 Yes Josue Hector, MD  pantoprazole (PROTONIX) 20 MG tablet Take 1 tablet by mouth once daily Patient taking differently: Take 20 mg by mouth daily.  08/18/19  Yes [place], Carley D, DO  sacubitril-valsartan (ENTRESTO) 97-103 MG Take 1 tablet by mouth 2 (two) times daily. 02/11/19  Yes Josue Hector, MD  spironolactone (ALDACTONE) 25 MG tablet Take 1 tablet (25 mg total) by mouth daily. 04/20/19 04/19/20 Yes Josue Hector, MD  Turmeric (QC TUMERIC COMPLEX PO) Take 1 tablet by mouth daily.   Yes [provider]  diphenhydrAMINE (BENADRYL) 25 MG tablet Take 2 tablets (50 mg total) by mouth every 8 (eight) hours as needed for up to 14 days (Nausea). Patient not taking: Reported on 09/14/2019 10/16/18 07/03/19  [name], MD  eletriptan (RELPAX) 20 MG tablet TAKE 1 TABLET BY MOUTH AS NEEDED FOR  MIGRAINE  OR  HEADACHE.  MAY  REPEAT  IN  2  HOURS  IF  HEADACHE  PERSISTS  OR  RECURS Patient not taking: Reported on 09/14/2019 04/30/18   [name], MD  guaiFENesin-codeine [facility]) 100-10 MG/5ML syrup Take 10 mLs by mouth at bedtime as needed for cough. Patient not taking: Reported on 09/14/2019 12/12/17   Asencion Noble, MD  hydrOXYzine (ATARAX/VISTARIL) 25 MG tablet Take 1 tablet (25 mg total) by mouth every 6 (six) hours. 09/14/19   Carmin Muskrat, MD  Insulin Pen Needle (B-D UF III MINI PEN NEEDLES) 31G X 5 MM MISC 1 Stick by Does not apply route daily. 11.69, insulin requirement 11/05/18   [name], MD  methocarbamol (ROBAXIN) 500 MG tablet Take 1 tablet (500 mg total) by mouth 2 (two) times daily. Patient not taking: Reported on 09/14/2019 07/03/19   Tedd Sias, PA  ondansetron (ZOFRAN) 4 MG tablet Take 1 tablet (4 mg total) by mouth daily as needed for up to 10 doses for nausea or vomiting. Patient not taking: Reported on 09/14/2019 11/05/18   [name], MD    Allergies    Patient has no known allergies.  Review of Systems   Review of Systems    Constitutional:       Per HPI, otherwise negative  HENT:       Per HPI, otherwise negative  Respiratory:       Per HPI, otherwise negative  Cardiovascular:       Per HPI, otherwise negative  Gastrointestinal: Negative for vomiting.  Endocrine:       Negative aside from HPI  Genitourinary:       Neg aside from HPI   Musculoskeletal:       Per HPI, otherwise negative  Skin: Negative.   Neurological: Positive for weakness. Negative for syncope.    Physical Exam Updated Vital Signs BP 136/66   Pulse 97   Temp 98.3 F (36.8 C) (Oral)   Resp 17   Ht 5' (1.524 m)   Wt 90.7 kg   SpO2 95%   BMI 39.06 kg/m   Physical Exam Vitals and nursing note reviewed.  Constitutional:      General: She is not in acute distress.    Appearance: She is well-developed.  HENT:     Head: Normocephalic and atraumatic.  Eyes:     Conjunctiva/sclera: Conjunctivae normal.  Cardiovascular:     Rate and Rhythm: Normal rate and regular rhythm.  Pulmonary:     Effort: Pulmonary effort is normal. No respiratory distress.     Breath sounds: No stridor. Decreased breath sounds present. No wheezing.  Abdominal:     General: There is no distension.  Skin:    General: Skin is warm and dry.  Neurological:     Mental Status: She is alert and oriented to person, place, and time.     Cranial Nerves: No cranial nerve deficit.     ED Results / Procedures / Treatments   Labs (all labs ordered are listed, but only abnormal results are displayed) Labs Reviewed  CBC WITH DIFFERENTIAL/PLATELET - Abnormal; Notable for the following components:      Result Value   RBC 3.80 (*)    Platelets 481 (*)    All other components within normal limits  COMPREHENSIVE METABOLIC PANEL - Abnormal; Notable for the following components:   Glucose, Bld 224 (*)    Total Protein 8.2 (*)    All other components within normal limits  D-DIMER, QUANTITATIVE (NOT AT [facility]) - Abnormal; Notable for the following components:    D-Dimer, Quant 3.06 (*)  All other components within normal limits  BRAIN NATRIURETIC PEPTIDE    EKG EKG Interpretation  Date/Time:  Monday September 14 2019 06:27:03 EDT Ventricular Rate:  98 PR Interval:    QRS Duration: 130 QT Interval:  391 QTC Calculation: 500 R Axis:   -8 Text Interpretation: Sinus rhythm Left bundle branch block No significant change since last tracing Confirmed by Ripley Fraise [phone]) on 09/14/2019 6:39:42 AM   Radiology DG Chest 2 View  Result Date: 09/14/2019 CLINICAL DATA:  History of COVID-19 infection. Short of breath and weakness. EXAM: CHEST - 2 VIEW COMPARISON:  09/12/2019 FINDINGS: Cardiomediastinal contours are stable. Decreased lung volumes. Increase in bilateral pulmonary opacities compared with 09/12/2019. No pleural effusion identified. IMPRESSION: 1. Interval progression of bilateral pulmonary opacities compatible with worsening pneumonia. Electronically Signed   By: Kerby Moors M.D.   On: 09/14/2019 07:31   CT Angio Chest PE W/Cm &/Or Wo Cm  Result Date: 09/14/2019 CLINICAL DATA:  Shortness of breath, COVID-19 positive 09/02/2019 EXAM: CT ANGIOGRAPHY CHEST WITH CONTRAST TECHNIQUE: Multidetector CT imaging of the chest was performed using the standard protocol during bolus administration of intravenous contrast. Multiplanar CT image reconstructions and MIPs were obtained to evaluate the vascular anatomy. CONTRAST:  161mL OMNIPAQUE IOHEXOL 350 MG/ML SOLN COMPARISON:  None. FINDINGS: Cardiovascular: Satisfactory opacification of the pulmonary arteries to the proximal segmental level. No evidence of pulmonary embolism. Mild cardiomegaly. No pericardial effusion. Mediastinum/Nodes: There are no enlarged lymph nodes. Visualized thyroid is unremarkable. Esophagus unremarkable. Lungs/Pleura: Patchy areas of consolidation and ground-glass density bilaterally with a peripheral and basilar predominance. No pericardial effusion. No pneumothorax. Upper  Abdomen: No acute abnormality. Musculoskeletal: No acute osseous abnormality. Review of the MIP images confirms the above findings. IMPRESSION: No acute pulmonary embolism. Multifocal pneumonia consistent with history of COVID-19. Electronically Signed   By: Macy Mis M.D.   On: 09/14/2019 15:46    Procedures Procedures (including critical care time)  Medications Ordered in ED Medications  albuterol (VENTOLIN HFA) 108 (90 Base) MCG/ACT inhaler 2 puff (2 puffs Inhalation Given 09/14/19 1355)  sodium chloride (PF) 0.9 % injection (has no administration in time range)  iohexol (OMNIPAQUE) 350 MG/ML injection 100 mL (100 mLs Intravenous Contrast Given 09/14/19 1530)  acetaminophen (TYLENOL) tablet 1,000 mg (1,000 mg Oral Given 09/14/19 1629)    ED Course  I have reviewed the triage vital signs and the nursing notes.  Pertinent labs & imaging results that were available during my care of the patient were reviewed by me and considered in my medical decision making (see chart for details).  Adult female multiple medical issues presents with dyspnea in the context of recent Covid diagnosis. Given her concern, CT angiography was pursued This did not demonstrate pulmonary embolism. Patient is found to have evidence for persistent Covid pneumonia.  Labs otherwise reassuring, and throughout hours of monitoring in the emergency department she required no supplemental oxygen, was in no distress. Though she did have persistent mild symptomatic dyspnea, she received albuterol, Tylenol for relief. Some suspicion for anxiousness secondary to dyspnea contributing to her presentation, and on discharge patient will receive Vistaril as well. As above, with otherwise reassuring findings, though she has known Covid infection, patient is appropriate for close outpatient follow-up.   Sandra [name] was evaluated in Emergency Department on 09/14/2019 for the symptoms described in the history of present illness.  She was evaluated in the context of the global COVID-19 pandemic, which necessitated consideration that the patient might be at risk  for infection with the SARS-CoV-2 virus that causes COVID-19. Institutional protocols and algorithms that pertain to the evaluation of patients at risk for COVID-19 are in a state of rapid change based on information released by regulatory bodies including the CDC and federal and state organizations. These policies and algorithms were followed during the patient's care in the ED.   Final Clinical Impression(s) / ED Diagnoses Final diagnoses:  Pneumonia due to COVID-19 virus    Rx / DC Orders ED Discharge Orders         Ordered    hydrOXYzine (ATARAX/VISTARIL) 25 MG tablet  Every 6 hours        09/14/19 1631           Carmin Muskrat, MD 09/14/19 [phone]

## 2019-09-14 NOTE — ED Notes (Signed)
Pt stated couldn't get enough air check oxgen level 93%

## 2019-09-14 NOTE — ED Triage Notes (Signed)
Patient arrived stating she was diagnosed with covid-19 on 8/11 and woke up today with shortness of breath. Declines any pain anywhere at this time.

## 2019-09-14 NOTE — Discharge Instructions (Signed)
As discussed, your body is still recuperating from your Covid infection.  This may take several more days or weeks.  If develop new, or concerning changes return here.  Otherwise call your physician tomorrow for appropriate ongoing management.  Please use the provided albuterol every 4 hours as needed for additional breathing relief.  He may use the provided medication for additional relief of anxiousness secondary to breathing difficulties as well.

## 2019-09-15 ENCOUNTER — Telehealth: Payer: Self-pay

## 2019-09-15 ENCOUNTER — Encounter: Payer: Self-pay | Admitting: Student

## 2019-09-15 ENCOUNTER — Ambulatory Visit (INDEPENDENT_AMBULATORY_CARE_PROVIDER_SITE_OTHER): Payer: BC Managed Care – PPO | Admitting: Student

## 2019-09-15 DIAGNOSIS — U071 COVID-19: Secondary | ICD-10-CM

## 2019-09-15 NOTE — Assessment & Plan Note (Addendum)
Has several risk factors for sever disease: HTN, HFrEF, asthma, DM, obesity Symptoms started on 8/10, positive COVID test on 8/11. Sx include SOB, cough, diarrhea, n/v. Received casirivimab\imdevimab infusion on 8/18. Continued having symptoms, especially worsening of SOB. Seen at the ED on 8/21 at which time she received some IV fluids, returned on 8/23 for continued SOB at which time CT positive for multifocal pneumonia consistent with COVID-19 infection. She was deemed appropriate for outpatient management and was noted to not require O2 saturation.  She was seen by telehealth today for continued SOB, reports O2 sat of 93% at rest today and 89% with activity. N/v have resolved and diarrhea has improved to 1x daily, states she is hydrating adequately. Still, she feels that she is unsafe and requests admission.  -patient has already received mab treatment, no further outpatient options are indicated at this time -consulted [facility] program, and they are sending a provider to evaluate her. patient is agreeable with this plan

## 2019-09-15 NOTE — Patient Instructions (Addendum)
Thank you for allowing [name] to be a part of your care today, it was pleasure seeing you. We discussed your COVID-19 infection  I have spoken with the [facility], who have agreed to evaluate you for admission through their program. We are also willing to manage you on an outpatient basis if that does not work out. Please call us if you have any further problems.  Thank you, and please call the Internal Medicine Clinic at [phone] if you have any questions.  Best, Dr. Bridgett Larsson

## 2019-09-15 NOTE — Progress Notes (Signed)
  [facility] Internal Medicine Residency Telephone Encounter Continuity Care Appointment  HPI:   This telephone encounter was created for [name] on 09/15/2019 for the following purpose/cc: COVID-19.   Past Medical History:  Past Medical History:  Diagnosis Date  . Acute pain of left shoulder 06/07/2017  . Asthma    as a child  . Asthma 11/07/2005   Reported Diagnosis in 2012, no formal spirometry   . Cardiomyopathy ([place])    likely nonischemic DCM per Dr. Johnsie Cancel note 04/2017  . Carpal tunnel syndrome   . Chest pain 07/11/2018  . Chronic HFrEF (heart failure with reduced ejection fraction) ([place]) 02/22/2012  . Diabetes mellitus type 2 in obese ([place]) 11/17/2009   - Synjardy 05-998 BID, Januvia 100mg  Daily,  Basaglar - Failed to tolerate Victoza  . Diabetes mellitus type 2, uncontrolled (HCC) DX: 1999   Started as gestational diabetes  . ESSENTIAL HYPERTENSION 07/08/2006  . Essential hypertension 07/08/2006  . 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 Gallstones   . HSV 11/07/2005   Qualifier: Diagnosis of  By: Pearline Cables MD, Belenda Cruise    . HSV (herpes simplex virus) anogenital infection   . HYPERLIPIDEMIA 08/20/2007  . Hyperlipidemia 08/20/2007   Atorvastatin 40mg  Daily Last Lipid panel may 2019: Total 138, HDL 31, LDL 89. ASCVD risk of 14.5%   . LBBB (left bundle branch block)   . Menorrhagia   . Migraine    Normal CT head (08/06/2006)  . Migraine without aura 09/02/2006   Fairly Well-controlled  2 per month  Takes Elitriptan   . 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 Perimenopausal menorrhagia 02/20/2012  . Psoriasis 04/29/2013  . Right low back pain 03/02/2014  . Routine adult health maintenance 09/27/2010  . Swelling of abdominal wall  03/02/2014  . Swelling of abdominal wall 03/02/2014  . Symptoms of upper respiratory infection (URI) 04/01/2018  . Viral URI with cough 01/13/2013      ROS:  Review of Systems  Constitutional: Negative for chills and fever.  HENT: Negative for congestion.   Respiratory: Positive for cough, sputum production and shortness of breath.   Cardiovascular: Negative for chest pain.  Gastrointestinal: Positive for diarrhea. Negative for abdominal pain, nausea and vomiting.  Musculoskeletal: Positive for myalgias.  Neurological: Positive for dizziness. Negative for loss of consciousness.       Assessment / Plan / Recommendations:   Please see A&P under problem oriented charting for assessment of the patient's acute and chronic medical conditions.   As always, pt is advised that if symptoms worsen or new symptoms arise, they should go to an urgent care facility or to to ER for further evaluation.   Consent and Medical Decision Making:   Patient seen with Dr. Evette Doffing  This is a telephone encounter between [name] and Andrew Au on 09/15/2019 for COVID-19 infection. The visit was conducted with the patient located at home and Andrew Au at [facility]. The patient's identity was confirmed using their DOB and current address. The patient has consented to being evaluated through a telephone encounter and understands the associated risks (an examination cannot be done and the patient may need to come in for an appointment) / benefits (allows the patient to remain at home, decreasing exposure to coronavirus). I personally spent 30 minutes on medical discussion.

## 2019-09-15 NOTE — Telephone Encounter (Signed)
Received call from patient, family friend Ubaldo Glassing and her sister Waldon Merl, were also on the phone.  Patient was dx'd with Covid and was seen in the ED yesterday with Covid pneumonia.  Family and patient was upset because patient did not receive any ABX.  They would like to speak to a physician regarding patient's dx and treatment.  Telehealth appt made for 1015 today w/ Dr. Bridgett Larsson. SChaplin, RN,BSN

## 2019-09-16 NOTE — Progress Notes (Signed)
Internal Medicine Clinic Attending  I spoke with the patient over the phone.  I personally confirmed the key portions of the history documented by Dr. Bridgett Larsson and I reviewed pertinent patient test results.  The assessment, diagnosis, and plan were formulated together and I agree with the documentation in the resident's note.

## 2019-09-21 ENCOUNTER — Other Ambulatory Visit: Payer: Self-pay

## 2019-09-21 ENCOUNTER — Encounter: Payer: Self-pay | Admitting: Student

## 2019-09-21 ENCOUNTER — Ambulatory Visit (INDEPENDENT_AMBULATORY_CARE_PROVIDER_SITE_OTHER): Payer: BC Managed Care – PPO | Admitting: Student

## 2019-09-21 DIAGNOSIS — F411 Generalized anxiety disorder: Secondary | ICD-10-CM | POA: Insufficient documentation

## 2019-09-21 DIAGNOSIS — F419 Anxiety disorder, unspecified: Secondary | ICD-10-CM

## 2019-09-21 DIAGNOSIS — I10 Essential (primary) hypertension: Secondary | ICD-10-CM | POA: Diagnosis not present

## 2019-09-21 DIAGNOSIS — E1169 Type 2 diabetes mellitus with other specified complication: Secondary | ICD-10-CM

## 2019-09-21 DIAGNOSIS — E785 Hyperlipidemia, unspecified: Secondary | ICD-10-CM

## 2019-09-21 DIAGNOSIS — I5022 Chronic systolic (congestive) heart failure: Secondary | ICD-10-CM

## 2019-09-21 DIAGNOSIS — E669 Obesity, unspecified: Secondary | ICD-10-CM

## 2019-09-21 MED ORDER — BASAGLAR KWIKPEN 100 UNIT/ML ~~LOC~~ SOPN: 10.0000 [IU] | PEN_INJECTOR | Freq: Every day | SUBCUTANEOUS | 2 refills | Status: DC

## 2019-09-21 MED ORDER — BD PEN NEEDLE MINI U/F 31G X 5 MM MISC: 1.0000 | Freq: Every day | 3 refills | Status: DC

## 2019-09-21 MED ORDER — HYDROXYZINE HCL 25 MG PO TABS: 25.0000 mg | ORAL_TABLET | Freq: Four times a day (QID) | ORAL | 0 refills | Status: AC

## 2019-09-21 MED ORDER — SITAGLIPTIN PHOSPHATE 100 MG PO TABS: 100.0000 mg | ORAL_TABLET | Freq: Every day | ORAL | 0 refills | Status: DC

## 2019-09-21 NOTE — Assessment & Plan Note (Signed)
Last A1c of 9.9 in November 2020, unable to obtain today since it is a telehealth visit. That was a significant improvement since starting Basaglar and stopping Victoza for significant nausea. Basaglar dose was increased at last visit. Currently being treated with steroids for COVID-19 infection, reports elevated blood sugars to 300s. Denies neuropathy or foot wounds.  \Reportedly had an eye exam in April at [facility], subsequently had unspecified laser surgery to the L eye, otherwise planning return visit in April 2022.   -continue Synjardy 05-998 BID -continue Januvia 100mg  daily -continue insulin glargine 10 units daily -as this is a telehealth visit, come back in 1 month or sooner pending recovery from [place] for foot exam, A1c, urine microalbumin     -keep in mind recent steroid use when interpreting this A1c

## 2019-09-21 NOTE — Assessment & Plan Note (Signed)
11/28/2018 echo with EF 35-78%, grade 2 diastolic dysfunction, LBBB. Repeat echo scheduled for 10/26 and f/u with cards on 10/27. Denies any recent exacerbation of symptoms, except for SOB due to COVID-19 infection. Reports complaince with Entresto, spironolactone, Toprol.   -continue Entresto 97-103 BID -continue Toprol 200mg  daily -continue spironlactone 25mg  daily -f/u with cardiology in October

## 2019-09-21 NOTE — Assessment & Plan Note (Signed)
Patient complains of anxiety related to current COVID-19 infection symptoms, particularly feeling short of breath. Had short course of hydroxyzine 25mg  prescribed to her at a ED visit, and reports this was helpful.  -start hydroxyzine 25mg  q6hr prn

## 2019-09-21 NOTE — Patient Instructions (Signed)
Thank you for allowing [name] to be a part of your care today, it was a pleasure seeing you. We discussed your COVID-19 infection, anxeity, hypertension, diabetes, and hyperlipidemia.  I am prescribing you roughly 10 days of hydroxyzine 25mg  every 6 hours as needed  Your other chronic problems seem to be stable, we will follow them up at your next clinic visit with labs including BMP, A1c, and lipid panel.  Please follow up in 1 month, or sooner pending recovery from COVID-19 infection   Thank you, and please call the Internal Medicine Clinic at [phone] if you have any questions.  Best, Dr. Bridgett Larsson

## 2019-09-21 NOTE — Assessment & Plan Note (Signed)
Unable to assess BP since this is a telehealth visit, but reports reading of 115/77 at home this AM. This may not reflect her chronic BP because she is currently infected with COVID-19 and is in the hospital at home program. Recent BP readings in our system of 130s-140s/60s-80s. Denies chest pain, palpitations, dizziness, headaches, LOC.    -continue Entresto 97-103 BID -continue Toprol 200mg  daily -continue spironlactone 25mg  daily -check BMP at next in-person visit, in about 1 month or sooner pending recovery from [place]

## 2019-09-21 NOTE — Progress Notes (Signed)
  [facility] Internal Medicine Residency Telephone Encounter Continuity Care Appointment  HPI:   This telephone encounter was created for [name] on 09/21/2019 for the following purpose/cc follow up on DM, HTN, HLD.   Past Medical History:  Past Medical History:  Diagnosis Date  . Acute pain of left shoulder 06/07/2017  . Asthma    as a child  . Asthma 11/07/2005   Reported Diagnosis in 2012, no formal spirometry   . Cardiomyopathy ([place])    likely nonischemic DCM per Dr. Johnsie Cancel note 04/2017  . Carpal tunnel syndrome   . Chest pain 07/11/2018  . Chronic HFrEF (heart failure with reduced ejection fraction) ([place]) 02/22/2012  . Diabetes mellitus type 2 in obese ([place]) 11/17/2009   - Synjardy 05-998 BID, Januvia 100mg  Daily,  Basaglar - Failed to tolerate Victoza  . Diabetes mellitus type 2, uncontrolled (HCC) DX: 1999   Started as gestational diabetes  . ESSENTIAL HYPERTENSION 07/08/2006  . Essential hypertension 07/08/2006  . 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 Gallstones   . HSV 11/07/2005   Qualifier: Diagnosis of  By: Pearline Cables MD, Belenda Cruise    . HSV (herpes simplex virus) anogenital infection   . HYPERLIPIDEMIA 08/20/2007  . Hyperlipidemia 08/20/2007   Atorvastatin 40mg  Daily Last Lipid panel may 2019: Total 138, HDL 31, LDL 89. ASCVD risk of 14.5%   . LBBB (left bundle branch block)   . Menorrhagia   . Migraine    Normal CT head (08/06/2006)  . Migraine without aura 09/02/2006   Fairly Well-controlled  2 per month  Takes Elitriptan   . 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 Perimenopausal menorrhagia 02/20/2012  . Psoriasis 04/29/2013  . Right low back pain 03/02/2014  . Routine adult health maintenance 09/27/2010  . Swelling of  abdominal wall 03/02/2014  . Swelling of abdominal wall 03/02/2014  . Symptoms of upper respiratory infection (URI) 04/01/2018  . Viral URI with cough 01/13/2013      ROS:  Review of Systems  Constitutional: Negative for chills and fever.  Respiratory: Positive for cough and shortness of breath.   Cardiovascular: Negative for chest pain and leg swelling.  Musculoskeletal: Positive for myalgias.  Psychiatric/Behavioral: The patient is nervous/anxious.        Assessment / Plan / Recommendations:   Please see A&P under problem oriented charting for assessment of the patient's acute and chronic medical conditions.   As always, pt is advised that if symptoms worsen or new symptoms arise, they should go to an urgent care facility or to to ER for further evaluation.   Consent and Medical Decision Making:   Patient seen with Dr. Rebeca Alert  This is a telephone encounter between Sandra [name] and Andrew Au on 09/21/2019 for follow up on DM, HTN, HLD. The visit was conducted with the patient located at home and Andrew Au at [facility]. The patient's identity was confirmed using their DOB and current address. The patient has consented to being evaluated through a telephone encounter and understands the associated risks (an examination cannot be done and the patient may need to come in for an appointment) / benefits (allows the patient to remain at home, decreasing exposure to coronavirus). I personally spent 20 minutes on medical discussion.

## 2019-09-21 NOTE — Assessment & Plan Note (Signed)
Most recent cholesterol panel in June 2020 with LDL 65. Taking Lipitor 40 without side effects at this time. ASCVD risk of 8.1% at this time.  -continue lipitor 40mg  -check lipid panel at next in-person visit

## 2019-09-22 ENCOUNTER — Encounter: Payer: Self-pay | Admitting: Student

## 2019-09-22 ENCOUNTER — Ambulatory Visit: Payer: BC Managed Care – PPO | Admitting: Student

## 2019-09-22 ENCOUNTER — Telehealth: Payer: Self-pay | Admitting: Student

## 2019-09-22 NOTE — Progress Notes (Signed)
  [facility] Internal Medicine Residency Telephone Encounter Continuity Care Appointment  HPI:   This telephone encounter was created for [name] on 09/22/2019 for the following purpose/cc: SOB due to COVID-19 infection.   Past Medical History:  Past Medical History:  Diagnosis Date  . Acute pain of left shoulder 06/07/2017  . Asthma    as a child  . Asthma 11/07/2005   Reported Diagnosis in 2012, no formal spirometry   . Cardiomyopathy ([place])    likely nonischemic DCM per Dr. Johnsie Cancel note 04/2017  . Carpal tunnel syndrome   . Chest pain 07/11/2018  . Chronic HFrEF (heart failure with reduced ejection fraction) ([place]) 02/22/2012  . Diabetes mellitus type 2 in obese ([place]) 11/17/2009   - Synjardy 05-998 BID, Januvia 100mg  Daily,  Basaglar - Failed to tolerate Victoza  . Diabetes mellitus type 2, uncontrolled (HCC) DX: 1999   Started as gestational diabetes  . ESSENTIAL HYPERTENSION 07/08/2006  . Essential hypertension 07/08/2006  . 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 Gallstones   . HSV 11/07/2005   Qualifier: Diagnosis of  By: Pearline Cables MD, Belenda Cruise    . HSV (herpes simplex virus) anogenital infection   . HYPERLIPIDEMIA 08/20/2007  . Hyperlipidemia 08/20/2007   Atorvastatin 40mg  Daily Last Lipid panel may 2019: Total 138, HDL 31, LDL 89. ASCVD risk of 14.5%   . LBBB (left bundle branch block)   . Menorrhagia   . Migraine    Normal CT head (08/06/2006)  . Migraine without aura 09/02/2006   Fairly Well-controlled  2 per month  Takes Elitriptan   . 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 Perimenopausal menorrhagia 02/20/2012  . Psoriasis 04/29/2013  . Right low back pain 03/02/2014  . Routine adult health maintenance 09/27/2010  . Swelling  of abdominal wall 03/02/2014  . Swelling of abdominal wall 03/02/2014  . Symptoms of upper respiratory infection (URI) 04/01/2018  . Viral URI with cough 01/13/2013      ROS:  Review of Systems  Constitutional: Negative for chills and fever.  Respiratory: Positive for shortness of breath and wheezing (mild).   Cardiovascular: Positive for orthopnea. Negative for chest pain and leg swelling.  Neurological: Positive for dizziness.       Assessment / Plan / Recommendations:   Please see A&P under problem oriented charting for assessment of the patient's acute and chronic medical conditions.   As always, pt is advised that if symptoms worsen or new symptoms arise, they should go to an urgent care facility or to to ER for further evaluation.   Consent and Medical Decision Making:   Patient seen with Dr. Rebeca Alert  This is a telephone encounter between [name] and Andrew Au on 09/22/2019 for COVID-19 infection. The visit was conducted with the patient located at home and Andrew Au at [facility]. The patient's identity was confirmed using their DOB and current address. The patient has consented to being evaluated through a telephone encounter and understands the associated risks (an examination cannot be done and the patient may need to come in for an appointment) / benefits (allows the patient to remain at home, decreasing exposure to coronavirus). I personally spent 15 minutes on medical discussion.

## 2019-09-22 NOTE — Progress Notes (Signed)
Internal Medicine Clinic Attending  Case discussed with Dr. Bridgett Larsson at the time of the visit.  We reviewed the resident's history and exam and pertinent patient test results.  I agree with the assessment, diagnosis, and plan of care documented in the resident's note.  Lenice Pressman, M.D., Ph.D.

## 2019-09-22 NOTE — Telephone Encounter (Signed)
Pt calling to report her BP is 91/63 and would like for a nurse o call back.

## 2019-09-22 NOTE — Patient Instructions (Signed)
Thank you for allowing [name] to be a part of your care today, it was a pleasure seeing you. We discussed your shortness of breath related to COVID-19 infection.  I am sorry that you are still feeling poorly. It sounds like this is due to your COVID-19 infection, and that you are getting appropriate treatment for this including oxygen and steroids. I evaluated for heart failure symptoms over the phone, and these sound reassuring that this is not a major piece of the picture.  After you are discharged from the hospital at home program, we would like you to follow up in clinic in-person to make sure nothing else is going on.  I am sending you some breathing exercises which may help you feel better.   Thank you, and please call the Internal Medicine Clinic at [phone] if you have any questions.  Best, Dr. Bridgett Larsson

## 2019-09-22 NOTE — Telephone Encounter (Signed)
Pt states all she is doing is sitting and gets up 4 -5  Times a day and walks around house. BP at this time while speaking w/ triage 107/75 HR 101 this am at 1000 91/65 and last night 92/67. States she does get dizzy sometimes when she walks around but since she has COVID she has been dizzy.  She desires another phone appt w/ dr Bridgett Larsson to discuss resp. Issues r/t COVID.  1415 8/31 dr Bridgett Larsson

## 2019-09-22 NOTE — Assessment & Plan Note (Signed)
Patient complaining of continued SOB, requiring supplemental oxygen. She is currently in the hospital at home program, being assessed by nursing staff daily and receiving steroids and likely Remdesivir (patient unable to recall specifically). We do not have access to their records, so I am unclear on the details. She also notes dizziness and mild hypotension. Hx significant for HFrEF, but my telehealth assessment suggests no exacerbation of this: denies leg swelling, nurse reported that her lungs sounded clear. She also otherwise seems stable and is being monitored appropriately. Informed patient that this is unfortunately a typical disease course, that her respiratory symptoms will likely improve gradually over time, and that she is receiving all the appropriate treatments right now.   -continue care via hospital at home program -follow up after discharge from hospital at home for in-person visit     -at that time, assess for heart failure of asthma contributing to picture -breathing exercises mailed to patient

## 2019-09-23 NOTE — Progress Notes (Signed)
Internal Medicine Clinic Attending  Case discussed with Dr. Bridgett Larsson at the time of the visit.  We reviewed the resident's history and exam and pertinent patient test results.  I agree with the assessment, diagnosis, and plan of care documented in the resident's note.  Lenice Pressman, M.D., Ph.D.

## 2019-09-29 ENCOUNTER — Other Ambulatory Visit: Payer: Self-pay

## 2019-09-29 ENCOUNTER — Other Ambulatory Visit: Payer: Self-pay | Admitting: Internal Medicine

## 2019-09-29 ENCOUNTER — Other Ambulatory Visit: Payer: BC Managed Care – PPO

## 2019-09-29 DIAGNOSIS — E1169 Type 2 diabetes mellitus with other specified complication: Secondary | ICD-10-CM

## 2019-09-29 DIAGNOSIS — E669 Obesity, unspecified: Secondary | ICD-10-CM

## 2019-09-29 DIAGNOSIS — Z20822 Contact with and (suspected) exposure to covid-19: Secondary | ICD-10-CM

## 2019-09-30 LAB — SARS-COV-2, NAA 2 DAY TAT

## 2019-09-30 LAB — NOVEL CORONAVIRUS, NAA: SARS-CoV-2, NAA: NOT DETECTED

## 2019-10-02 ENCOUNTER — Ambulatory Visit: Payer: Self-pay | Admitting: *Deleted

## 2019-10-02 ENCOUNTER — Telehealth: Payer: Self-pay

## 2019-10-02 NOTE — Telephone Encounter (Signed)
Patient is calling to ask can receive covid vaccine on Tuesday? She took COVID infusion on 09/09/19   Call to patient:  Patient states she has monoclonal infusion at [facility]  and she wants to know if is is true that she should wait 90 days before she gets vaccine.  Per CDC guidelines: Patient treated with monoclonal infusion should wait 90 days before getting COVID vaccine.  Do not see charting or contact at hospital for this infusion treatment for this patient- nor do I see COVID testing in chart.  Reason for Disposition . Health Information question, no triage required and triager able to answer question  Protocols used: INFORMATION ONLY CALL - NO TRIAGE-A-AH

## 2019-10-02 NOTE — Telephone Encounter (Signed)
Patient has 2 charts- request merge- COVID diagnosis and treatment in duplicate chart.

## 2019-10-02 NOTE — Telephone Encounter (Signed)
Spoke with pt today, informed her FMLA form has been completed and faxed.

## 2019-10-06 ENCOUNTER — Ambulatory Visit: Payer: Self-pay

## 2019-10-06 NOTE — Progress Notes (Signed)
Cardiology Office Note  Date:  10/06/2019   ID:  Sandra [name], DOB [birth year], MRN [id number]  PCP:  Andrew Au, MD  Cardiologist:  Johnsie Cancel  _____________  1 month follow-up  _____________   History of Present Illness: Sandra [name] is a 54 y.o. female with pmh of DM, HLD, HTN, atypical chest pain, chronic LBBB, NICM with EF down to 30-35% who presents today for cardiology evaluation.  The patient has a history of NICM with EF of 45% in 2014 and again on echo 04/2017. She had an echo in 11.2020 and EF was notd to be lower, down to 30-35%. Myoview stress test 04/2017 showing no evidence of CAD and EF 40-45%.  ACE was stopped and she was started on Entresto.   The patient was admitted 06/2019 for chest pain and seen by cardiology. HS trop negative and EKG was non-acute. The pain was reproducible on exam and felt to be MSK in origin. Volume status was okay.   The patient was last seen 08/18/19 for hospital follow-up. She reported chest pain worse with arm movement. She was taking Entresto, Toprol, aldactone for NICM. Echo was ordered which has not been completed yet.   Today, she reports that she tested positive for COVID August11th. She was placed on Eliquis for 2 weeks. She tested negative 1 week ago. Since then she has felt severely short of breath and has been using 2L O2 as needed, which mainly is when walking. 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 She also reports pleuritic chest pain. No exertional pain. Has chronic orthopnea but hs been worse since COVID. She has an appointment with PCP tomorrow. PCP also following diabetes. BP readings show good Bps 100-120/70-80. Heart rates have been high since the end of August, around 110. Today HR is high 111. O2 stable. Feeling intermittent palpitations.  No lower leg pain. Lungs clear. No dizziness or lightheadedness. Denies LLE Appetite has been low and has intermittent  diarrhea.   She denies symptoms of  PND, lower extremity edema, claudication, dizziness, presyncope, syncope, bleeding, or neurologic sequela. The patient is tolerating medications without difficulties and is otherwise without complaint today.  _____________   Past Medical History:  Diagnosis Date  . Acute pain of left shoulder 06/07/2017  . Asthma    as a child  . Asthma 11/07/2005   Reported Diagnosis in 2012, no formal spirometry   . Cardiomyopathy ([place])    likely nonischemic DCM per Dr. Johnsie Cancel note 04/2017  . Carpal tunnel syndrome   . Chest pain 07/11/2018  . Chronic HFrEF (heart failure with reduced ejection fraction) ([place]) 02/22/2012  . Diabetes mellitus type 2 in obese ([place]) 11/17/2009   - Synjardy 05-998 BID, Januvia 100mg  Daily,  Basaglar - Failed to tolerate Victoza  . Diabetes mellitus type 2, uncontrolled (HCC) DX: 1999   Started as gestational diabetes  . ESSENTIAL HYPERTENSION 07/08/2006  . Essential hypertension 07/08/2006  . 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 Gallstones   . HSV 11/07/2005   Qualifier: Diagnosis of  By: Pearline Cables MD, Belenda Cruise    . HSV (herpes simplex virus) anogenital infection   . HYPERLIPIDEMIA 08/20/2007  . Hyperlipidemia 08/20/2007   Atorvastatin 40mg  Daily Last Lipid panel may 2019: Total 138, HDL 31, LDL 89. ASCVD risk of 14.5%   . LBBB (left bundle branch block)   . Menorrhagia   . Migraine  Normal CT head (08/06/2006)  . Migraine without aura 09/02/2006   Fairly Well-controlled  2 per month  Takes Elitriptan   . 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 Perimenopausal menorrhagia 02/20/2012  . Psoriasis 04/29/2013  . Right low back pain 03/02/2014  . Routine adult health maintenance 09/27/2010  . Swelling of  abdominal wall 03/02/2014  . Swelling of abdominal wall 03/02/2014  . Symptoms of upper respiratory infection (URI) 04/01/2018  . Viral URI with cough 01/13/2013   Past Surgical History:  Procedure Laterality Date  . CHOLECYSTECTOMY  2009  . DILATATION & CURETTAGE/HYSTEROSCOPY WITH MYOSURE N/A 12/25/2017   Procedure: DILATATION & CURETTAGE/HYSTEROSCOPY   POLYPECTOMY WITH MYOSURE;  Surgeon: Charyl Bigger, MD;  Location: [facility];  Service: Gynecology;  Laterality: N/A;  . TUBAL LIGATION     _____________  Current Outpatient Medications  Medication Sig Dispense Refill  . aspirin EC 81 MG tablet Take 81 mg by mouth daily. Swallow whole.    . Aspirin-Acetaminophen-Caffeine (GOODY HEADACHE PO) Take 1 packet by mouth as needed (headache/pain).    Marland Kitchen atorvastatin (LIPITOR) 40 MG tablet Take 1 tablet by mouth once daily 90 tablet 1  . fluticasone (FLONASE) 50 MCG/ACT nasal spray Place 1-2 sprays into both nostrils daily as needed for allergies or rhinitis.    . Insulin Glargine (BASAGLAR KWIKPEN) 100 UNIT/ML Inject 0.1 mLs (10 Units total) into the skin daily. 3 mL 2  . Insulin Pen Needle (B-D UF III MINI PEN NEEDLES) 31G X 5 MM MISC 1 Stick by Does not apply route daily. 11.69, insulin requirement 30 each 3  . menthol-cetylpyridinium (CEPACOL) 3 MG lozenge Take 1 lozenge by mouth as needed for sore throat.    . metoprolol succinate (TOPROL-XL) 200 MG 24 hr tablet Take 1 tablet (200 mg total) by mouth daily. Take with or immediately following a meal. 90 tablet 3  . pantoprazole (PROTONIX) 20 MG tablet Take 1 tablet by mouth once daily (Patient taking differently: Take 20 mg by mouth daily. ) 90 tablet 0  . sacubitril-valsartan (ENTRESTO) 97-103 MG Take 1 tablet by mouth 2 (two) times daily. 60 tablet 11  . sitaGLIPtin (JANUVIA) 100 MG tablet Take 1 tablet (100 mg total) by mouth daily. 90 tablet 0  . spironolactone (ALDACTONE) 25 MG tablet Take 1 tablet (25 mg total) by mouth daily. 30 tablet 11  .  SYNJARDY XR 05-998 MG TB24 TAKE 1  BY MOUTH TWICE DAILY WITH A MEAL 60 tablet 0  . Turmeric (QC TUMERIC COMPLEX PO) Take 1 tablet by mouth daily.     No current facility-administered medications for this visit.   _____________   Allergies:   Patient has no known allergies.  _____________   Social History:  The patient  reports that she has never smoked. She has never used smokeless tobacco. She reports current alcohol use. She reports that she does not use drugs.  _____________   Family History:  The patient's family history includes Diabetes in her father; Hypertension in her father.  _____________   ROS:  Please see the history of present illness.   Positive for SOB, palpitatios, pleuritic chest pain,   All other systems are reviewed and negative.  _____________   PHYSICAL EXAM: VS:  There were no vitals taken for this visit. , BMI There is no height or weight on file to calculate BMI. GEN: Well nourished, well developed, in no acute distress  HEENT: normal  Neck: no JVD, carotid bruits, or masses Cardiac:  RR, tachycardic; no murmurs, rubs, or gallops. No clubbing, cyanosis, edema.  Radials/DP/PT 2+ and equal bilaterally.  Respiratory:  clear to auscultation bilaterally, normal work of breathing GI: soft, nontender, nondistended, + BS MS: no deformity or atrophy  Skin: warm and dry, no rash Neuro:  Strength and sensation are intact Psych: euthymic mood, full affect _____________  EKG:   The ekg ordered today shows Sinus tach, 107bpm, LBBB,   Recent Labs: 09/14/2019: ALT 24; B Natriuretic Peptide 84.6; BUN 14; Creatinine, Ser 0.77; Hemoglobin 12.5; Platelets 481; Potassium 4.3; Sodium 139  No results found for requested labs within last 8760 hours.  CrCl cannot be calculated (Patient's most recent lab result is older than the maximum 21 days allowed.).  Wt Readings from Last 3 Encounters:  09/14/19 200 lb (90.7 kg)  09/12/19 200 lb (90.7 kg)  08/18/19 200 lb 6.4 oz (90.9  kg)    Relevant studies:  Echo 11/28/19 1. Left ventricular ejection fraction, by visual estimation, is 30 to  35%. The left ventricle has normal function. There is no left ventricular  hypertrophy.  2. Abnormal septal motion consistent with left bundle branch block.  3. Elevated left atrial and left ventricular end-diastolic pressures.  4. Left ventricular diastolic parameters are consistent with Grade II  diastolic dysfunction (pseudonormalization).  5. Global right ventricle has normal systolic function.The right  ventricular size is normal. No increase in right ventricular wall  thickness.  6. Left atrial size was mildly dilated.  7. Right atrial size was normal.  8. The mitral valve is normal in structure. Mild mitral valve  regurgitation. No evidence of mitral stenosis.  9. The tricuspid valve is normal in structure. Tricuspid valve  regurgitation is not demonstrated.  10. The aortic valve is normal in structure. Aortic valve regurgitation is  not visualized. No evidence of aortic valve sclerosis or stenosis.  11. The pulmonic valve was normal in structure. Pulmonic valve  regurgitation is not visualized.  12. The inferior vena cava is normal in size with greater than 50%  respiratory variability, suggesting right atrial pressure of 3 mmHg.   Myoview stress test 04/2017   Nuclear stress EF: 38%.  No T wave inversion was noted during stress.  There was no ST segment deviation noted during stress.  Defect 1: There is a small defect of mild severity present in the apical septal location.  This is an intermediate risk study.  The left ventricular ejection fraction is moderately decreased (30-44%).   Intermediate risk study due to moderately depressed left ventricular systolic function, but without reversible perfusion abnormalities. There is a small fixed LBBB-related perfusion artifact.   _____________   ASSESSMENT AND PLAN:  SOB Patient tested positive  for COVID August 11th and placed on Eliquis for 2 weeks. Tested negative last week. Still severely SOB, even on supplemental O2. Unable to go back to work with O2. Heart rates have been consistently high since end of August, 100-110bpm. Also reporting pleuritic chest pain. HR 111 bpm today. O2 stable. EKG with Sinus tach. No LL pain. Suspicious for PE. Will order stat CTA chest. Last creatinine 0.77 on 8/23. Will also move up Echo. Patient has follow-up with PCP tomorrow.   NICM On echo 11/2018 EF noted to be low 30-35% and started on Entresto. Myoview at that time showed no evidence of CAD with EF 40-45%.  Follow-up echo scheduled for October, however will move up as above. Continue Entresto, Toprol, Spironolactone. Euvolemic today.   H/o Atypical Chest pain  Admitted in 06/2019 for MSK chest pain and seen again in follow-up with similar complaint. Last ischemic work-up was myoview in 2019 showing no evidence of ischemia. Patient reports pleuritic chest pain, which is consistent with COVID. No exertional pain.   DM2 Last recorded one 11/2018 9.9. Needs to follow-up with PCP.   Chronic LBBB Low risk Myoview in 2019  HLD Atorvastatin. Last LDL 65 on 07/11/2018  Hypertension Bps good on current regimen   Disposition:   FU with APP/Dr. Johnsie Cancel in 1 month or sooner if needed   Signed, Kemani Demarais Ninfa Meeker, PA-C 10/06/2019 2:00 PM    _____________ [facility] [address] [place] [address]  [phone] (office) [phone] (fax)

## 2019-10-08 ENCOUNTER — Other Ambulatory Visit: Payer: Self-pay

## 2019-10-08 ENCOUNTER — Ambulatory Visit (INDEPENDENT_AMBULATORY_CARE_PROVIDER_SITE_OTHER)
Admission: RE | Admit: 2019-10-08 | Discharge: 2019-10-08 | Disposition: A | Discharge status: Home / Self Care | Payer: BC Managed Care – PPO | Source: Ambulatory Visit | Attending: Medical | Admitting: Medical

## 2019-10-08 ENCOUNTER — Encounter: Payer: Self-pay | Admitting: Cardiology

## 2019-10-08 ENCOUNTER — Ambulatory Visit: Payer: BC Managed Care – PPO | Admitting: Medical

## 2019-10-08 VITALS — BP 130/60 | HR 112 | Ht 60.0 in | Wt 185.2 lb

## 2019-10-08 DIAGNOSIS — R0602 Shortness of breath: Secondary | ICD-10-CM

## 2019-10-08 DIAGNOSIS — R0789 Other chest pain: Secondary | ICD-10-CM

## 2019-10-08 DIAGNOSIS — I10 Essential (primary) hypertension: Secondary | ICD-10-CM | POA: Diagnosis not present

## 2019-10-08 DIAGNOSIS — I420 Dilated cardiomyopathy: Secondary | ICD-10-CM

## 2019-10-08 MED ORDER — IOHEXOL 350 MG/ML SOLN
80.0000 mL | Freq: Once | INTRAVENOUS | Status: AC | PRN
  Administered 2019-10-08: 80 mL via INTRAVENOUS

## 2019-10-08 NOTE — Patient Instructions (Signed)
Medication Instructions:  Your physician recommends that you continue on your current medications as directed. Please refer to the Current Medication list given to you today.  *If you need a refill on your cardiac medications before your next appointment, please call your pharmacy*   Lab Work: None ordered  If you have labs (blood work) drawn today and your tests are completely normal, you will receive your results only by: Marland Kitchen MyChart Message (if you have MyChart) OR . A paper copy in the mail If you have any lab test that is abnormal or we need to change your treatment, we will call you to review the results.   Testing/Procedures: Non-Cardiac CT Angiography (CTA), is a special type of CT scan that uses a computer to produce multi-dimensional views of major blood vessels throughout the body. In CT angiography, a contrast material is injected through an IV to help visualize the blood vessels     Follow-Up: At [facility], you and your health needs are our priority.  As part of our continuing mission to provide you with exceptional heart care, we have created designated Provider Care Teams.  These Care Teams include your primary Cardiologist (physician) and Advanced Practice Providers (APPs -  Physician Assistants and Nurse Practitioners) who all work together to provide you with the care you need, when you need it.  We recommend signing up for the patient portal called "MyChart".  Sign up information is provided on this After Visit Summary.  MyChart is used to connect with patients for Virtual Visits (Telemedicine).  Patients are able to view lab/test results, encounter notes, upcoming appointments, etc.  Non-urgent messages can be sent to your provider as well.   To learn more about what you can do with MyChart, go to [email or web address].    Your next appointment:   KEEP YOUR SCHEDULED APPOINTMENT WITH DR. Johnsie Cancel  The format for your next appointment:   In Person  Provider:    Jenkins Rouge, MD   Other Instructions

## 2019-10-09 ENCOUNTER — Encounter: Payer: Self-pay | Admitting: Internal Medicine

## 2019-10-09 ENCOUNTER — Ambulatory Visit: Payer: BC Managed Care – PPO | Admitting: Internal Medicine

## 2019-10-09 DIAGNOSIS — F419 Anxiety disorder, unspecified: Secondary | ICD-10-CM | POA: Diagnosis not present

## 2019-10-09 DIAGNOSIS — U071 COVID-19: Secondary | ICD-10-CM | POA: Diagnosis not present

## 2019-10-09 MED ORDER — TRAZODONE HCL 50 MG PO TABS: 50.0000 mg | ORAL_TABLET | Freq: Every day | ORAL | 1 refills | Status: DC

## 2019-10-09 MED ORDER — SERTRALINE HCL 25 MG PO TABS: ORAL_TABLET | ORAL | 0 refills | Status: DC

## 2019-10-09 NOTE — Patient Instructions (Signed)
I have sent in 2 prescriptions to help with your anxiousness. The first one is an SSRI called zoloft. This one will take 4-6 weeks to take full effect. I would like you to start with taking one pill a day for 7 days and then increase that to 2 pills after that. The other medication is called trazodone. This is a medication that will hopefully let you get some sleep.

## 2019-10-09 NOTE — Progress Notes (Signed)
Office Visit   Patient ID: Sandra [name], female    DOB: 1965/08/02, 54 y.o.   MRN: [id number]  Subjective:  CC: shortness of breath  HPI 54 y.o. presents today for evaluation of persistent shortness of breath sensation. She tested + for COVID 19 on 8/11. She recovered at home through the hospital at home program. She had received remdesivir at that time. She ended up requiring supplemental oxygen up to 2L. Over time, she has started not to require it during that day but still uses it at night. She does not feel short of breath when she goes to sleep but is worried she will wake up short of breath. She relays that she is anxious when she has to leave the house because she can't take her oxygen with her.   She was seen in the cardiology clinic yesterday and had reported some pleuritic like pain. CTA was obtained and negative for PE. Other findings were consistent with post covid.       ACTIVE MEDICATIONS   Current Outpatient Medications on File Prior to Visit  Medication Sig Dispense Refill  . aspirin EC 81 MG tablet Take 81 mg by mouth daily. Swallow whole.    . Aspirin-Acetaminophen-Caffeine (GOODY HEADACHE PO) Take 1 packet by mouth as needed (headache/pain).    Marland Kitchen atorvastatin (LIPITOR) 40 MG tablet Take 1 tablet by mouth once daily 90 tablet 1  . fluticasone (FLONASE) 50 MCG/ACT nasal spray Place 1-2 sprays into both nostrils daily as needed for allergies or rhinitis.    . Insulin Glargine (BASAGLAR KWIKPEN) 100 UNIT/ML Inject 0.1 mLs (10 Units total) into the skin daily. 3 mL 2  . Insulin Pen Needle (B-D UF III MINI PEN NEEDLES) 31G X 5 MM MISC 1 Stick by Does not apply route daily. 11.69, insulin requirement 30 each 3  . menthol-cetylpyridinium (CEPACOL) 3 MG lozenge Take 1 lozenge by mouth as needed for sore throat.    . metoprolol succinate (TOPROL-XL) 200 MG 24 hr tablet Take 1 tablet (200 mg total) by mouth daily. Take with or immediately following a meal. 90 tablet 3  .  pantoprazole (PROTONIX) 20 MG tablet Take 1 tablet by mouth once daily 90 tablet 0  . sacubitril-valsartan (ENTRESTO) 97-103 MG Take 1 tablet by mouth 2 (two) times daily. 60 tablet 11  . sitaGLIPtin (JANUVIA) 100 MG tablet Take 1 tablet (100 mg total) by mouth daily. 90 tablet 0  . spironolactone (ALDACTONE) 25 MG tablet Take 1 tablet (25 mg total) by mouth daily. 30 tablet 11  . SYNJARDY XR 05-998 MG TB24 TAKE 1  BY MOUTH TWICE DAILY WITH A MEAL 60 tablet 0  . Turmeric (QC TUMERIC COMPLEX PO) Take 1 tablet by mouth daily.     No current facility-administered medications on file prior to visit.    ROS  Review of Systems  Constitutional: Positive for appetite change. Negative for chills and fever.  Respiratory: Positive for shortness of breath.   Cardiovascular: Negative for chest pain.  Neurological: Negative for light-headedness.    Objective:   BP 128/70 (BP Location: Left Arm, Patient Position: Sitting, Cuff Size: Normal)   Pulse 98   Temp 98.8 F (37.1 C)   Wt 188 lb 1.6 oz (85.3 kg)   SpO2 99%   BMI 36.74 kg/m  Wt Readings from Last 3 Encounters:  10/09/19 188 lb 1.6 oz (85.3 kg)  10/08/19 185 lb 3.2 oz (84 kg)  09/14/19 200 lb (90.7 kg)  BP Readings from Last 3 Encounters:  10/09/19 128/70  10/08/19 130/60  09/14/19 (!) 142/73   Physical Exam Constitutional:      Appearance: Normal appearance.  Cardiovascular:     Rate and Rhythm: Normal rate and regular rhythm.  Pulmonary:     Effort: Pulmonary effort is normal.     Comments: Fine inspiratory crackles at the bases. Abdominal:     General: There is no distension.  Psychiatric:     Comments: Denies suicidal thoughts     Health Maintenance:   Health Maintenance  Topic Date Due  . Hepatitis C Screening  Never done  . COVID-19 Vaccine (1) Never done  . FOOT EXAM  10/18/2018  . HEMOGLOBIN A1C  03/06/2019  . URINE MICROALBUMIN  07/17/2019  . INFLUENZA VACCINE  08/23/2019  . PAP SMEAR-Modifier   02/27/2020  . MAMMOGRAM  03/05/2020  . OPHTHALMOLOGY EXAM  04/22/2020  . TETANUS/TDAP  03/20/2021  . COLONOSCOPY  06/08/2027  . PNEUMOCOCCAL POLYSACCHARIDE VACCINE AGE 62-64 HIGH RISK  Completed  . HIV Screening  Completed     Assessment & Plan:   Problem List Items Addressed This Visit      Other   COVID-19    She is doing much better from a respiratory standpoint however continues to use supplmental O2 at night due to worries about waking up short of breath. See anxiety under problem list. On exam, she does still have those find inspiratory crackles which are likely the result of her prior covid pna. She is breathing comfortably on room air with O2 sat of 99%. Plan --pt given information on the post-COVID clinic --advised COVID-19 vaccination 90d following remdesivir infusion      Anxiety with sleep disturbance    Pt's sx seem most consistent with anxiety after a stressful event (COVID-19 pneumonia). We discussed pharmaceutical treatment since this seems to be greatly impacting her life. Plan --start zoloft 25mg  x7d, then increase to 50mg  there after --trazodone 50mg  qhs for sleep. Recommended she not take this with the hydroxazine --f/u in 4w via telehealth for reassessment. Can increase zoloft up to 100 mg if symptoms remain uncontrolled.      Relevant Medications   sertraline (ZOLOFT) 25 MG tablet   traZODone (DESYREL) 50 MG tablet        Pt discussed with Dr. Marcille Buffy, MD Internal Medicine Resident PGY-2 [facility] Pager: [phone] 10/10/2019 10:46 AM

## 2019-10-10 NOTE — Assessment & Plan Note (Signed)
Pt's sx seem most consistent with anxiety after a stressful event (COVID-19 pneumonia). We discussed pharmaceutical treatment since this seems to be greatly impacting her life. Plan --start zoloft 25mg  x7d, then increase to 50mg  there after --trazodone 50mg  qhs for sleep. Recommended she not take this with the hydroxazine --f/u in 4w via telehealth for reassessment. Can increase zoloft up to 100 mg if symptoms remain uncontrolled.

## 2019-10-10 NOTE — Assessment & Plan Note (Addendum)
She is doing much better from a respiratory standpoint however continues to use supplmental O2 at night due to worries about waking up short of breath. See anxiety under problem list. On exam, she does still have those find inspiratory crackles which are likely the result of her prior covid pna. She is breathing comfortably on room air with O2 sat of 99%. Plan --pt given information on the post-COVID clinic --advised COVID-19 vaccination 90d following remdesivir infusion

## 2019-10-13 ENCOUNTER — Ambulatory Visit (INDEPENDENT_AMBULATORY_CARE_PROVIDER_SITE_OTHER): Payer: BC Managed Care – PPO | Admitting: Internal Medicine

## 2019-10-13 ENCOUNTER — Ambulatory Visit (INDEPENDENT_AMBULATORY_CARE_PROVIDER_SITE_OTHER): Payer: BC Managed Care – PPO | Admitting: Nurse Practitioner

## 2019-10-13 ENCOUNTER — Telehealth: Payer: Self-pay

## 2019-10-13 ENCOUNTER — Other Ambulatory Visit: Payer: Self-pay

## 2019-10-13 VITALS — BP 148/92 | HR 95 | Temp 97.1°F | Wt 188.0 lb

## 2019-10-13 DIAGNOSIS — R0609 Other forms of dyspnea: Secondary | ICD-10-CM

## 2019-10-13 DIAGNOSIS — F411 Generalized anxiety disorder: Secondary | ICD-10-CM

## 2019-10-13 DIAGNOSIS — Z8616 Personal history of COVID-19: Secondary | ICD-10-CM | POA: Diagnosis not present

## 2019-10-13 DIAGNOSIS — R0600 Dyspnea, unspecified: Secondary | ICD-10-CM

## 2019-10-13 DIAGNOSIS — F419 Anxiety disorder, unspecified: Secondary | ICD-10-CM

## 2019-10-13 NOTE — Progress Notes (Signed)
[facility] Residency Telephone Encounter Continuity Care Appointment   CC: medication reaction   HPI:  This telephone encounter was created for [name] on 10/13/2019 for the above purpose.  I recently saw pt on 9/17 for evaluation of anxiety and insomnia. She was started on zoloft and trazodone at that visit. Telephone notes reviewed from 9/21 morning indicating that patient called to the clinic saying "Alleluia, Alleluia, Alleluia" and that she had "crazy thoughts going through my head". She relayed that she was worried this was a medication reaction.  Upon calling the patient for this visit, she did not have any erradict behavior was able to be conversational. She notes that she had been feeling suicidal over the past 2 days. She denied having a plan of following through with this and stated that "I couldn't do that to my family".  She again denied personal history of bipolar disorder. She does have her husband with whom she lives with for support.   Past Medical History:  Past Medical History:  Diagnosis Date  . Acute pain of left shoulder 06/07/2017  . Asthma    as a child  . Asthma 11/07/2005   Reported Diagnosis in 2012, no formal spirometry   . Cardiomyopathy ([place])    likely nonischemic DCM per Dr. Johnsie Cancel note 04/2017  . Carpal tunnel syndrome   . Chest pain 07/11/2018  . Chronic HFrEF (heart failure with reduced ejection fraction) ([place]) 02/22/2012  . Diabetes mellitus type 2 in obese ([place]) 11/17/2009   - Synjardy 05-998 BID, Januvia 100mg  Daily,  Basaglar - Failed to tolerate Victoza  . Diabetes mellitus type 2, uncontrolled (HCC) DX: 1999   Started as gestational diabetes  . ESSENTIAL HYPERTENSION 07/08/2006  . Essential hypertension 07/08/2006  . 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 Gallstones   . HSV 11/07/2005   Qualifier: Diagnosis of  By: Pearline Cables MD, Belenda Cruise    . HSV (herpes simplex virus) anogenital infection   . HYPERLIPIDEMIA 08/20/2007  . Hyperlipidemia 08/20/2007   Atorvastatin 40mg  Daily Last Lipid panel may 2019: Total 138, HDL 31, LDL 89. ASCVD risk of 14.5%   . LBBB (left bundle branch block)   . Menorrhagia   . Migraine    Normal CT head (08/06/2006)  . Migraine without aura 09/02/2006   Fairly Well-controlled  2 per month  Takes Elitriptan   . 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 Perimenopausal menorrhagia 02/20/2012  . Psoriasis 04/29/2013  . Right low back pain 03/02/2014  . Routine adult health maintenance 09/27/2010  . Swelling of abdominal wall 03/02/2014  . Swelling of abdominal wall 03/02/2014  . Symptoms of upper respiratory infection (URI) 04/01/2018  . Viral URI with cough 01/13/2013      ROS:  Review of Systems  Cardiovascular: Negative for chest pain.  Skin: Negative for rash.  Psychiatric/Behavioral: Positive for depression and suicidal ideas. The patient is nervous/anxious and has insomnia.        Denies suicidal intention.      Assessment / Plan / Recommendations:  Problem List Items Addressed This Visit      Other   Anxiety with sleep disturbance    This was a telehealth visit today for evaluation of medication side effect.  She does endorse suicidal ideation but denies any plans to follow through with this because she would not want to do that to her family. She has denied a history of bipolar disorder both helped her initial  visit for this as well as today.  Unfortunately, I think this is just a adverse effect to the medication. Plan: Advised patient to discontinue Zoloft and trazodone.   Advised patient to immediately report to our office or the emergency department if symptoms worsen or she develops suicidal plans.  Patient notes that husband is with her for support today.  I would like her to follow-up in our office in a week for reevaluation symptoms and to revisit anxiety and insomnia.       As always, pt is advised that if symptoms worsen or new symptoms arise, they should go to an urgent care facility or to to ER for further evaluation.   Consent and Medical Decision Making:   Patient discussed with Dr. Angelia Mould  This is a telephone encounter between [place] and [place] on 10/13/2019 for medication adverse event. The visit was conducted with the patient located at home and [employer] at [facility]. The patient's identity was confirmed using their DOB and current address. The patient has consented to being evaluated through a telephone encounter and understands the associated risks (an examination cannot be done and the patient may need to come in for an appointment) / benefits (allows the patient to remain at home, decreasing exposure to coronavirus). I personally spent 20 minutes on medical discussion.

## 2019-10-13 NOTE — Patient Instructions (Addendum)
History of Covid Shortness of breath:  Continue albuterol as needed  Will place referral to pulmonary  Stay active  Continue follow up with cardiology  Patient walked in office today - O2 sats stable   Anxiety:  Patient states that she is feeling better now and has family here with her  Number given to crisis line - in case needed -   Patient has an appointment scheduled with PCP this afternoon  Call 911 if you start to experience any thoughts of harming yourself  Referral placed to psychiatry   Follow up:  Follow as needed

## 2019-10-13 NOTE — Progress Notes (Signed)
@Patient  ID: Sandra [name], female    DOB: [birth year], 54 y.o.   MRN: [id number]  Chief Complaint  Patient presents with  . Covid Positive    Positive 8/11. Sx: Anxiety, breathing issues. Patient stated she was having a panic attack this morning believes it is Trazadone and Sertraline.She did speak with a nurse at her PCP office and appt was made for today. Patient denied having thoughts while in office and stated she a bit calmer now.      Referring provider: Andrew Au, MD  54 year old female with history of migraine, cardiomyopathy, hypertension,   HPI  Patient presents today for post COVID care clinic visit.  She tested positive for Covid on 09/02/2019.  She states that this morning she has been having issues with anxiety and panic attacks.  She was recently started on trazodone and sertraline.  She has contacted her PCP office and was advised to stop this medication and her PCP has scheduled an appointment with her later today to discuss this issue.  Patient states that she is feeling much better while in office this morning.  She is complaining of ongoing issues since Covid with shortness of breath with exertion.  We discussed that this could be related to anxiety and panic attacks.  Patient was walked in office today and her O2 sats did remain stable. Denies f/c/s, n/v/d, hemoptysis, PND, chest pain or edema.     Allergies  Allergen Reactions  . Drug Class [Trazodone And Nefazodone]     Developed suicidal ideation within 3 days of starting  . Sertraline Hcl     Developed suicidal ideation within 3d of starting    Immunization History  Administered Date(s) Administered  . Influenza Whole 09/27/2010  . PPD Test 12/22/2012, 04/27/2014  . Pneumococcal Polysaccharide-23 03/21/2011  . Tdap 03/21/2011    Past Medical History:  Diagnosis Date  . Acute pain of left shoulder 06/07/2017  . Asthma    as a child  . Asthma 11/07/2005   Reported Diagnosis in 2012, no formal  spirometry   . Cardiomyopathy ([place])    likely nonischemic DCM per Dr. Johnsie Cancel note 04/2017  . Carpal tunnel syndrome   . Chest pain 07/11/2018  . Chronic HFrEF (heart failure with reduced ejection fraction) ([place]) 02/22/2012  . Diabetes mellitus type 2 in obese ([place]) 11/17/2009   - Synjardy 05-998 BID, Januvia 100mg  Daily,  Basaglar - Failed to tolerate Victoza  . Diabetes mellitus type 2, uncontrolled (HCC) DX: 1999   Started as gestational diabetes  . ESSENTIAL HYPERTENSION 07/08/2006  . Essential hypertension 07/08/2006  . 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 Gallstones   . HSV 11/07/2005   Qualifier: Diagnosis of  By: Pearline Cables MD, Belenda Cruise    . HSV (herpes simplex virus) anogenital infection   . HYPERLIPIDEMIA 08/20/2007  . Hyperlipidemia 08/20/2007   Atorvastatin 40mg  Daily Last Lipid panel may 2019: Total 138, HDL 31, LDL 89. ASCVD risk of 14.5%   . LBBB (left bundle branch block)   . Menorrhagia   . Migraine    Normal CT head (08/06/2006)  . Migraine without aura 09/02/2006   Fairly Well-controlled  2 per month  Takes Elitriptan   . 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 Perimenopausal menorrhagia 02/20/2012  . Psoriasis 04/29/2013  . Right low back pain 03/02/2014  . Routine adult health maintenance 09/27/2010  . Swelling of abdominal wall 03/02/2014  . Swelling of abdominal wall 03/02/2014  . Symptoms  of upper respiratory infection (URI) 04/01/2018  . Viral URI with cough 01/13/2013    Tobacco History: Social History   Tobacco Use  Smoking Status Never Smoker  Smokeless Tobacco Never Used   Counseling given: Not Answered   Outpatient Encounter Medications as of 10/13/2019  Medication Sig  . aspirin EC 81 MG tablet Take 81 mg by mouth  daily. Swallow whole.  . Aspirin-Acetaminophen-Caffeine (GOODY HEADACHE PO) Take 1 packet by mouth as needed (headache/pain).  Marland Kitchen atorvastatin (LIPITOR) 40 MG tablet Take 1 tablet by mouth once daily  . fluticasone (FLONASE) 50 MCG/ACT nasal spray Place 1-2 sprays into both nostrils daily as needed for allergies or rhinitis.  . Insulin Glargine (BASAGLAR KWIKPEN) 100 UNIT/ML Inject 0.1 mLs (10 Units total) into the skin daily.  . Insulin Pen Needle (B-D UF III MINI PEN NEEDLES) 31G X 5 MM MISC 1 Stick by Does not apply route daily. 11.69, insulin requirement  . metoprolol succinate (TOPROL-XL) 200 MG 24 hr tablet Take 1 tablet (200 mg total) by mouth daily. Take with or immediately following a meal.  . sacubitril-valsartan (ENTRESTO) 97-103 MG Take 1 tablet by mouth 2 (two) times daily.  . sitaGLIPtin (JANUVIA) 100 MG tablet Take 1 tablet (100 mg total) by mouth daily.  Marland Kitchen spironolactone (ALDACTONE) 25 MG tablet Take 1 tablet (25 mg total) by mouth daily.  . [DISCONTINUED] pantoprazole (PROTONIX) 20 MG tablet Take 1 tablet by mouth once daily  . [DISCONTINUED] sertraline (ZOLOFT) 25 MG tablet Take 1 tablet (25 mg total) by mouth daily for 7 days, THEN 2 tablets (50 mg total) daily for 21 days.  . [DISCONTINUED] traZODone (DESYREL) 50 MG tablet Take 1 tablet (50 mg total) by mouth at bedtime.  Marland Kitchen menthol-cetylpyridinium (CEPACOL) 3 MG lozenge Take 1 lozenge by mouth as needed for sore throat. (Patient not taking: Reported on 10/13/2019)  . SYNJARDY XR 05-998 MG TB24 TAKE 1  BY MOUTH TWICE DAILY WITH A MEAL  . Turmeric (QC TUMERIC COMPLEX PO) Take 1 tablet by mouth daily. (Patient not taking: Reported on 10/13/2019)   No facility-administered encounter medications on file as of 10/13/2019.     Review of Systems  Review of Systems  Respiratory: Positive for shortness of breath.   Psychiatric/Behavioral: Negative for suicidal ideas. The patient is nervous/anxious.        Physical Exam  BP (!)  148/92 (BP Location: Left Arm)   Pulse 95   Temp (!) 97.1 F (36.2 C)   Wt 188 lb (85.3 kg)   SpO2 97%   BMI 36.72 kg/m   Wt Readings from Last 5 Encounters:  10/14/19 184 lb 9.6 oz (83.7 kg)  10/13/19 188 lb (85.3 kg)  10/09/19 188 lb 1.6 oz (85.3 kg)  10/08/19 185 lb 3.2 oz (84 kg)  09/14/19 200 lb (90.7 kg)     Physical Exam Vitals and nursing note reviewed.  Constitutional:      General: She is not in acute distress.    Appearance: She is well-developed.  Cardiovascular:     Rate and Rhythm: Normal rate and regular rhythm.  Pulmonary:     Effort: Pulmonary effort is normal.     Breath sounds: Normal breath sounds.  Musculoskeletal:     Right lower leg: No edema.     Left lower leg: No edema.  Neurological:     Mental Status: She is alert and oriented to person, place, and time.  Psychiatric:        Mood  and Affect: Mood normal.        Behavior: Behavior normal.       Imaging: CT Head Wo Contrast  Result Date: 10/14/2019 CLINICAL DATA:  Numbness and tingling. EXAM: CT HEAD WITHOUT CONTRAST TECHNIQUE: Contiguous axial images were obtained from the base of the skull through the vertex without intravenous contrast. COMPARISON:  August 06, 2006 FINDINGS: Brain: No evidence of acute infarction, hemorrhage, hydrocephalus, extra-axial collection or mass lesion/mass effect. Vascular: No hyperdense vessel or unexpected calcification. Skull: Normal. Negative for fracture or focal lesion. Sinuses/Orbits: No acute finding. Other: None. IMPRESSION: No acute intracranial pathology. Electronically Signed   By: Virgina Norfolk M.D.   On: 10/14/2019 18:15   CT ANGIO CHEST PE W OR WO CONTRAST  Result Date: 10/08/2019 CLINICAL DATA:  Persistent shortness of breath. COVID diagnosis in August of 2021. EXAM: CT ANGIOGRAPHY CHEST WITH CONTRAST TECHNIQUE: Multidetector CT imaging of the chest was performed using the standard protocol during bolus administration of intravenous contrast.  Multiplanar CT image reconstructions and MIPs were obtained to evaluate the vascular anatomy. CONTRAST:  32mL OMNIPAQUE IOHEXOL 350 MG/ML SOLN COMPARISON:  September 14, 2019 FINDINGS: Cardiovascular: Satisfactory opacification of the pulmonary arteries to the segmental level. No evidence of pulmonary embolism. Heart is mildly enlarged. No pericardial effusion. Mediastinum/Nodes: No enlarged mediastinal, hilar, or axillary lymph nodes. Thyroid gland, trachea, and esophagus demonstrate no significant findings. Lungs/Pleura: Improvement in bibasilar predominant airspace consolidative opacities. There are scattered areas of residual ground-glass opacities at sites of previously seen COVID 19 pneumonia. Scattered bibasilar atelectasis. Upper Abdomen: No acute abnormality. Musculoskeletal: Mild degenerative changes of the thoracic spine. Review of the MIP images confirms the above findings. IMPRESSION: 1. No evidence of acute pulmonary embolism. 2. Improvement in bibasilar predominant airspace consolidative opacities. There are scattered areas of residual ground-glass opacities at sites of previously seen COVID 19 pneumonia likely reflecting the chronic sequela of COVID-19 pneumonia and potentially scarring. Electronically Signed   By: Valentino Saxon MD   On: 10/08/2019 10:09     Assessment & Plan:   History of COVID-19 Shortness of breath:  Continue albuterol as needed  Will place referral to pulmonary  Stay active  Continue follow up with cardiology  Patient walked in office today - O2 sats stable   Anxiety:  Patient states that she is feeling better now and has family here with her  Number given to crisis line - in case needed -   Patient has an appointment scheduled with PCP this afternoon  Call 911 if you start to experience any thoughts of harming yourself  Referral placed to psychiatry   Follow up:  Follow as needed      Fenton Foy, NP 10/15/2019

## 2019-10-13 NOTE — Telephone Encounter (Signed)
TC transferred to RN, when RN answered call, patient was repeating "Alleluia, Alleluia, Alleluia" Patient informed RN she was started on Sertraline and Trazadone and now she had "Crazy thoughts going through her head".  Patient denies any suicidal or homicidal thoughts, but won't elaborate.  She is insisting the medication has made her anxiety worse.  RN informed patient not to take any further medication until she speaks to MD and she is given a telehealth appt. With Dr. Cain Saupe team today at 3:15 for med management.  Patient also instructed if she has any suicidal or homicidal thoughts to present to [facility] ED, [facility] phone number also given to patient.  Patient is repeating "I am not crazy, this is a medication reaction, Alleluia, Alleluia".   As of note, pt has an appt today at [facility], which patient informs RN she is trying to get ready for now. SChaplin, RN,BSN

## 2019-10-14 ENCOUNTER — Ambulatory Visit: Payer: BC Managed Care – PPO | Admitting: Internal Medicine

## 2019-10-14 ENCOUNTER — Ambulatory Visit (HOSPITAL_COMMUNITY)
Admission: RE | Admit: 2019-10-14 | Discharge: 2019-10-14 | Disposition: A | Discharge status: Home / Self Care | Payer: BC Managed Care – PPO | Source: Ambulatory Visit | Attending: Internal Medicine | Admitting: Internal Medicine

## 2019-10-14 ENCOUNTER — Encounter: Payer: Self-pay | Admitting: Internal Medicine

## 2019-10-14 VITALS — BP 137/76 | HR 91 | Temp 99.1°F | Ht 60.0 in | Wt 184.6 lb

## 2019-10-14 DIAGNOSIS — R202 Paresthesia of skin: Secondary | ICD-10-CM

## 2019-10-14 DIAGNOSIS — R200 Anesthesia of skin: Secondary | ICD-10-CM | POA: Insufficient documentation

## 2019-10-14 DIAGNOSIS — R079 Chest pain, unspecified: Secondary | ICD-10-CM

## 2019-10-14 DIAGNOSIS — K219 Gastro-esophageal reflux disease without esophagitis: Secondary | ICD-10-CM | POA: Diagnosis not present

## 2019-10-14 DIAGNOSIS — E1169 Type 2 diabetes mellitus with other specified complication: Secondary | ICD-10-CM

## 2019-10-14 LAB — GLUCOSE, CAPILLARY: Glucose-Capillary: 124 mg/dL — ABNORMAL HIGH (ref 70–99)

## 2019-10-14 MED ORDER — PANTOPRAZOLE SODIUM 20 MG PO TBEC: 40.0000 mg | DELAYED_RELEASE_TABLET | Freq: Every day | ORAL | 0 refills | Status: DC

## 2019-10-14 NOTE — Progress Notes (Signed)
   CC: left sided numbness   HPI:  Sandra [name] is a 54 y.o. with PMH as below.   Please see A&P for assessment of the patient's acute and chronic medical conditions.  Yesterday evening she started to notice tingling and numbness along her left arm, back and leg. She states it has been constant but sometimes flares up for a few minutes. No facial numbness. This has never happened before. She denies weakness or pain in these areas, shortness of breath, dizziness, headaches, changes in vision.   She has recently had worsening of her reflux with constant central chest pain. Not necessarily associated with food. She is also having full left sided tingling in her arm and leg. No N/V, abdominal pain, dizziness, jaw pain.    Past Medical History:  Diagnosis Date  . Acute pain of left shoulder 06/07/2017  . Asthma    as a child  . Asthma 11/07/2005   Reported Diagnosis in 2012, no formal spirometry   . 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 Johnsie Cancel note 04/2017  . Carpal tunnel syndrome   . Chest pain 07/11/2018  . Chronic HFrEF (heart failure with reduced ejection fraction) ([place]) 02/22/2012  . Diabetes mellitus type 2 in obese ([place]) 11/17/2009   - Synjardy 05-998 BID, Januvia 100mg  Daily,  Basaglar - Failed to tolerate Victoza  . Diabetes mellitus type 2, uncontrolled (HCC) DX: 1999   Started as gestational diabetes  . ESSENTIAL HYPERTENSION 07/08/2006  . Essential hypertension 07/08/2006  . 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 Gallstones   . HSV 11/07/2005   Qualifier: Diagnosis of  By: Pearline Cables MD, Belenda Cruise    . HSV (herpes simplex virus) anogenital infection   . HYPERLIPIDEMIA 08/20/2007  . Hyperlipidemia 08/20/2007   Atorvastatin 40mg  Daily  Last Lipid panel may 2019: Total 138, HDL 31, LDL 89. ASCVD risk of 14.5%   . LBBB (left bundle branch block)   . Menorrhagia   . Migraine    Normal CT head (08/06/2006)  . Migraine without aura 09/02/2006   Fairly Well-controlled  2 per month  Takes Elitriptan   . 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 Perimenopausal menorrhagia 02/20/2012  . Psoriasis 04/29/2013  . Right low back pain 03/02/2014  . Routine adult health maintenance 09/27/2010  . Swelling of abdominal wall 03/02/2014  . Swelling of abdominal wall 03/02/2014  . Symptoms of upper respiratory infection (URI) 04/01/2018  . Viral URI with cough 01/13/2013   Review of Systems:   10 point ROS negative except as noted in HPI  Physical Exam:  Constitution: NAD, appears stated age HENT: [place]/AT Eyes: no icterus or injection, eom intact Cardio: RRR, no m/r/g, no LE edema  MSK: strength 5/5 and symmetrical, decreased sensation left arm and leg Neuro: normal affect, a&ox3, CN II-XII intact Skin: c/d/i    Vitals:   10/14/19 1542  BP: 137/76  Pulse: 91  Temp: 99.1 F (37.3 C)  TempSrc: Oral  SpO2: 98%  Weight: 184 lb 9.6 oz (83.7 kg)  Height: 5' (1.524 m)     Assessment & Plan:   See Encounters Tab for problem based charting.  Patient discussed with Dr. Daryll Drown

## 2019-10-14 NOTE — Patient Instructions (Addendum)
Thank you for allowing [name] to provide your care today. Today we discussed your left sided tingling.   I have ordered a CT Head and Neck. I will let you know the results.   Please follow-up after your CT scan.   I have increased your pantoprazole to 40 mg - take this in the morning before breakfast.   Please call the internal medicine center clinic if you have any questions or concerns, we may be able to help and keep you from a long and expensive emergency room wait. Our clinic and after hours phone number is [phone], the best time to call is Monday through Friday 9 am to 4 pm but there is always someone available 24/7 if you have an emergency. If you need medication refills please notify your pharmacy one week in advance and they will send [name] a request.

## 2019-10-14 NOTE — Assessment & Plan Note (Signed)
This was a telehealth visit today for evaluation of medication side effect.  She does endorse suicidal ideation but denies any plans to follow through with this because she would not want to do that to her family. She has denied a history of bipolar disorder both helped her initial visit for this as well as today.  Unfortunately, I think this is just a adverse effect to the medication. Plan: Advised patient to discontinue Zoloft and trazodone.  Advised patient to immediately report to our office or the emergency department if symptoms worsen or she develops suicidal plans.  Patient notes that husband is with her for support today.  I would like her to follow-up in our office in a week for reevaluation symptoms and to revisit anxiety and insomnia.

## 2019-10-14 NOTE — Progress Notes (Signed)
Internal Medicine Clinic Attending  Case discussed with Dr. Christian  At the time of the visit.  We reviewed the resident's history and exam and pertinent patient test results.  I agree with the assessment, diagnosis, and plan of care documented in the resident's note.  

## 2019-10-15 ENCOUNTER — Encounter: Payer: Self-pay | Admitting: Internal Medicine

## 2019-10-15 DIAGNOSIS — K219 Gastro-esophageal reflux disease without esophagitis: Secondary | ICD-10-CM | POA: Insufficient documentation

## 2019-10-15 DIAGNOSIS — R0600 Dyspnea, unspecified: Secondary | ICD-10-CM | POA: Insufficient documentation

## 2019-10-15 DIAGNOSIS — R200 Anesthesia of skin: Secondary | ICD-10-CM | POA: Insufficient documentation

## 2019-10-15 DIAGNOSIS — R0609 Other forms of dyspnea: Secondary | ICD-10-CM

## 2019-10-15 DIAGNOSIS — Z8616 Personal history of COVID-19: Secondary | ICD-10-CM | POA: Insufficient documentation

## 2019-10-15 HISTORY — DX: Anesthesia of skin: R20.0

## 2019-10-15 HISTORY — DX: Other forms of dyspnea: R06.09

## 2019-10-15 LAB — MICROALBUMIN / CREATININE URINE RATIO
Creatinine, Urine: 105.7 mg/dL
Microalb/Creat Ratio: <3 mg/g creat (ref 0–29)
Microalbumin, Urine: <3 ug/mL

## 2019-10-15 NOTE — Assessment & Plan Note (Signed)
She has recently had worsening of her reflux with constant central chest pain. Not necessarily associated with food. She is also having full left sided tingling in her arm and leg. No N/V, abdominal pain, dizziness, jaw pain.  The area is NTTP. ECG done and shows chronic LBBB unchanged from prior.   - increase protonix to 40 mg per day. If symptoms do not improve, will refer to GI

## 2019-10-15 NOTE — Assessment & Plan Note (Addendum)
Yesterday evening she started to notice tingling and numbness along her left arm, back and leg. She states it has been constant but sometimes flares up for a few minutes. No facial numbness. This has never happened before. She denies weakness or pain in these areas, shortness of breath, dizziness, headaches, changes in vision.  Physical exam benign except for decreased sensation upper and lower extremity. Will need to rule out pure sensory stroke or other spinal nerve compression.   - stat CT head  - CT cervical spine

## 2019-10-15 NOTE — Assessment & Plan Note (Signed)
Shortness of breath:  Continue albuterol as needed  Will place referral to pulmonary  Stay active  Continue follow up with cardiology  Patient walked in office today - O2 sats stable   Anxiety:  Patient states that she is feeling better now and has family here with her  Number given to crisis line - in case needed -   Patient has an appointment scheduled with PCP this afternoon  Call 911 if you start to experience any thoughts of harming yourself  Referral placed to psychiatry   Follow up:  Follow as needed

## 2019-10-16 NOTE — Telephone Encounter (Signed)
I spoke with her

## 2019-10-16 NOTE — Telephone Encounter (Signed)
Received TC from patient, she states she missed a call from this number yesterday.  Per chart review, a letter was written by Dr. Sharon Seller yesterday R/T her CT results and the results were given to the patient by RN.  Patient verbalized understanding of results and is requesting MD call her. Will forward to Dr. Sharon Seller. SChaplin, RN,BSN

## 2019-10-16 NOTE — Telephone Encounter (Signed)
TC placed to phone number provide, VM obtained, VM box is full and RN unable to leave message. SChaplin, RN,BSN

## 2019-10-16 NOTE — Telephone Encounter (Signed)
pls contact pt [phone]

## 2019-10-20 ENCOUNTER — Telehealth: Payer: Self-pay | Admitting: *Deleted

## 2019-10-20 ENCOUNTER — Ambulatory Visit (INDEPENDENT_AMBULATORY_CARE_PROVIDER_SITE_OTHER): Payer: BC Managed Care – PPO | Admitting: Internal Medicine

## 2019-10-20 ENCOUNTER — Other Ambulatory Visit: Payer: Self-pay

## 2019-10-20 DIAGNOSIS — F411 Generalized anxiety disorder: Secondary | ICD-10-CM | POA: Diagnosis not present

## 2019-10-20 NOTE — Progress Notes (Addendum)
CC: depression and anxiety  This is a telephone encounter between [employer] and [employer] on 10/20/2019 for anxiety. The visit was conducted with the patient located at home and Marty Heck at [facility]. The patient's identity was confirmed using their DOB and current address. The patient has consented to being evaluated through a telephone encounter and understands the associated risks (an examination cannot be done and the patient may need to come in for an appointment) / benefits (allows the patient to remain at home, decreasing exposure to coronavirus). I personally spent 17 minutes on medical discussion.   HPI:  [name] is a 54 y.o. with PMH as below.   Please see A&P for assessment of the patient's acute and chronic medical conditions.   She's feeling better than earlier today when she was having increased anxiety and feeling depressed. She has been having episodes of anxiety since having covid which come out of nowhere. This is sometimes accompanied by feeling of SOB and like she can't breath and like she would rather not live than experience the anxiety and emotional pain. The episodes can last from less than an hour to half the day and is happening multiple times per week. She has thoughts of not wanting to live when it happens but would never actually hurt herself and would like the thoughts to stop. She is not currently experiencing one of these episodes.  She denies nausea, chest pain. She will sometimes feel her heart racing. She denies nausea, changes in urination or bowel habits. She continues to have left sided tingling.   Past Medical History:  Diagnosis Date  . Acute pain of left shoulder 06/07/2017  . Asthma    as a child  . Asthma 11/07/2005   Reported Diagnosis in 2012, no formal spirometry   . 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 Johnsie Cancel note 04/2017  . Carpal tunnel syndrome   . Chest pain 07/11/2018  . Chronic HFrEF (heart  failure with reduced ejection fraction) ([place]) 02/22/2012  . Diabetes mellitus type 2 in obese ([place]) 11/17/2009   - Synjardy 05-998 BID, Januvia 100mg  Daily,  Basaglar - Failed to tolerate Victoza  . Diabetes mellitus type 2, uncontrolled (HCC) DX: 1999   Started as gestational diabetes  . ESSENTIAL HYPERTENSION 07/08/2006  . Essential hypertension 07/08/2006  . 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 Gallstones   . HSV 11/07/2005   Qualifier: Diagnosis of  By: Pearline Cables MD, Belenda Cruise    . HSV (herpes simplex virus) anogenital infection   . HYPERLIPIDEMIA 08/20/2007  . Hyperlipidemia 08/20/2007   Atorvastatin 40mg  Daily Last Lipid panel may 2019: Total 138, HDL 31, LDL 89. ASCVD risk of 14.5%   . LBBB (left bundle branch block)   . Menorrhagia   . Migraine    Normal CT head (08/06/2006)  . Migraine without aura 09/02/2006   Fairly Well-controlled  2 per month  Takes Elitriptan   . 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 Perimenopausal menorrhagia 02/20/2012  . Psoriasis 04/29/2013  . Right low back pain 03/02/2014  . Routine adult health maintenance 09/27/2010  . Swelling of abdominal wall 03/02/2014  . Swelling of abdominal wall 03/02/2014  . Symptoms of upper respiratory infection (URI) 04/01/2018  . Viral URI with cough 01/13/2013   Review of Systems:   ROS negative except as noted in HPI  Assessment & Plan:   See Encounters Tab for problem based charting.  Patient discussed with Dr. Dareen Piano

## 2019-10-20 NOTE — Assessment & Plan Note (Signed)
She's feeling better than earlier today when she was having increased anxiety and feeling depressed. She has been having episodes of anxiety since having covid which come out of nowhere. This is sometimes accompanied by feeling of SOB and like she can't breath and like she would rather not live than experience the anxiety and emotional pain. The episodes can last from less than an hour to half the day and is happening multiple times per week. She has thoughts of not wanting to live when it happens but would never actually hurt herself and would like the thoughts to stop. She is not currently experiencing one of these episodes.  She denies nausea, chest pain. She will sometimes feel her heart racing. She denies nausea, changes in urination or bowel habits. She continues to have left sided tingling. Recent CT scan was negative for acute findings.  There are many cases of post-covid neurological effects, some including depression and anxiety. With her history of CHF and TIIDM, arrhythmia is also a possibility and lower on the differential. She has follow-up with cardiology later this month.   - TSH, discussed coming in for lab - discussed starting zoloft and short term benzodiazepine but she would rather wait - given number for behavioral health, she will call them today  - f/u with cardiology to also discuss symptoms

## 2019-10-20 NOTE — Telephone Encounter (Signed)
Patient called in stating she was instructed to stop Zoloft and trazadone as she was having "crazy thoughts." States she has been off these meds for more than a week and she is still having "crazy thoughts." When asked what these thoughts were, she responded, "like I want to hurt myself but I know I'm not going to do that." Denies plan. States anxiety has increased as well. Tele appt given today with [facility] Team at 3:15. Hubbard Hartshorn, BSN, RN-BC

## 2019-10-20 NOTE — Progress Notes (Signed)
Internal Medicine Clinic Attending  Case discussed with Dr. Seawell  At the time of the visit.  We reviewed the resident's history and exam and pertinent patient test results.  I agree with the assessment, diagnosis, and plan of care documented in the resident's note.  

## 2019-10-22 ENCOUNTER — Ambulatory Visit (HOSPITAL_COMMUNITY): Payer: BC Managed Care – PPO | Attending: Cardiology

## 2019-10-22 ENCOUNTER — Other Ambulatory Visit: Payer: Self-pay

## 2019-10-22 DIAGNOSIS — I420 Dilated cardiomyopathy: Secondary | ICD-10-CM | POA: Diagnosis present

## 2019-10-22 LAB — ECHOCARDIOGRAM COMPLETE: S' Lateral: 3.3 cm

## 2019-10-22 NOTE — Progress Notes (Signed)
Internal Medicine Clinic Attending  Case discussed with Dr. Seawell  At the time of the visit.  We reviewed the resident's history and exam and pertinent patient test results.  I agree with the assessment, diagnosis, and plan of care documented in the resident's note.  

## 2019-10-28 ENCOUNTER — Other Ambulatory Visit: Payer: Self-pay

## 2019-10-28 ENCOUNTER — Ambulatory Visit (INDEPENDENT_AMBULATORY_CARE_PROVIDER_SITE_OTHER): Payer: BC Managed Care – PPO | Admitting: Internal Medicine

## 2019-10-28 ENCOUNTER — Encounter: Payer: Self-pay | Admitting: Internal Medicine

## 2019-10-28 VITALS — BP 132/82 | HR 106 | Temp 98.2°F | Ht 60.0 in | Wt 190.4 lb

## 2019-10-28 DIAGNOSIS — R5383 Other fatigue: Secondary | ICD-10-CM | POA: Diagnosis not present

## 2019-10-28 DIAGNOSIS — K219 Gastro-esophageal reflux disease without esophagitis: Secondary | ICD-10-CM

## 2019-10-28 DIAGNOSIS — F411 Generalized anxiety disorder: Secondary | ICD-10-CM | POA: Diagnosis not present

## 2019-10-28 DIAGNOSIS — U099 Post covid-19 condition, unspecified: Secondary | ICD-10-CM

## 2019-10-28 MED ORDER — PANTOPRAZOLE SODIUM 20 MG PO TBEC: 40.0000 mg | DELAYED_RELEASE_TABLET | Freq: Every day | ORAL | 0 refills | Status: DC

## 2019-10-28 NOTE — Patient Instructions (Signed)
Ms. Venturella, It was nice meeting you!  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 I will work on getting the medical information they have requested sent in.  Be sure to call regarding the hour limitations and let our office know.   Take care, Dr. Koleen Distance

## 2019-10-29 ENCOUNTER — Telehealth: Payer: Self-pay

## 2019-10-29 ENCOUNTER — Encounter: Payer: Self-pay | Admitting: Internal Medicine

## 2019-10-29 DIAGNOSIS — U099 Post covid-19 condition, unspecified: Secondary | ICD-10-CM | POA: Insufficient documentation

## 2019-10-29 DIAGNOSIS — R058 Other specified cough: Secondary | ICD-10-CM | POA: Insufficient documentation

## 2019-10-29 LAB — TSH: TSH: 1.6 u[IU]/mL (ref 0.450–4.500)

## 2019-10-29 NOTE — Telephone Encounter (Signed)
Returned call to patient. 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 I will be happy to write a letter for return to work when she needs it.

## 2019-10-29 NOTE — Telephone Encounter (Signed)
Pt states she contacted human resources and they told her she cannot return to work as part time. Requesting to speak with Dr. Koleen Distance regarding a Letter for short term disability.

## 2019-10-29 NOTE — Progress Notes (Signed)
Acute Office Visit  Subjective:    Patient ID: Sandra [name], female    DOB: 05-15-1965, 54 y.o.   MRN: [id number]  Chief Complaint  Patient presents with  . Follow-up    forms for Short Term Disability     HPI Patient is in today requesting paperwork for short-term disability regarding post-COVID syndrome. Please see problem based charting for further details.   Past Medical History:  Diagnosis Date  . Acute pain of left shoulder 06/07/2017  . Asthma    as a child  . Asthma 11/07/2005   Reported Diagnosis in 2012, no formal spirometry   . 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 Johnsie Cancel note 04/2017  . Carpal tunnel syndrome   . Chest pain 07/11/2018  . Chronic HFrEF (heart failure with reduced ejection fraction) ([place]) 02/22/2012  . Diabetes mellitus type 2 in obese ([place]) 11/17/2009   - Synjardy 05-998 BID, Januvia 100mg  Daily,  Basaglar - Failed to tolerate Victoza  . Diabetes mellitus type 2, uncontrolled ([facility]) DX: 1999   Started as gestational diabetes  . ESSENTIAL HYPERTENSION 07/08/2006  . Essential hypertension 07/08/2006  . 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 Gallstones   . HSV 11/07/2005   Qualifier: Diagnosis of  By: Pearline Cables MD, Belenda Cruise    . HSV (herpes simplex virus) anogenital infection   . HYPERLIPIDEMIA 08/20/2007  . Hyperlipidemia 08/20/2007   Atorvastatin 40mg  Daily Last Lipid panel may 2019: Total 138, HDL 31, LDL 89. ASCVD risk of 14.5%   . LBBB (left bundle branch block)   . Menorrhagia   . Migraine    Normal CT head (08/06/2006)  . Migraine without aura 09/02/2006   Fairly Well-controlled  2 per month  Takes Elitriptan   . 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 Perimenopausal menorrhagia  02/20/2012  . Psoriasis 04/29/2013  . Right low back pain 03/02/2014  . Routine adult health maintenance 09/27/2010  . Swelling of abdominal wall 03/02/2014  . Swelling of abdominal wall 03/02/2014  . Symptoms of upper respiratory infection (URI) 04/01/2018  . Viral URI with cough 01/13/2013    Past Surgical History:  Procedure Laterality Date  . CHOLECYSTECTOMY  2009  . DILATATION & CURETTAGE/HYSTEROSCOPY WITH MYOSURE N/A 12/25/2017   Procedure: DILATATION & CURETTAGE/HYSTEROSCOPY   POLYPECTOMY WITH MYOSURE;  Surgeon: Charyl Bigger, MD;  Location: [facility];  Service: Gynecology;  Laterality: N/A;  . TUBAL LIGATION      Family History  Problem Relation Age of Onset  . Hypertension Father   . Diabetes Father     Social History   Socioeconomic History  . Marital status: Married    Spouse name: Not on file  . Number of children: Not on file  . Years of education: Not on file  . Highest education level: Not on file  Occupational History  . Occupation: Chemical engineer  Tobacco Use  . Smoking status: Never Smoker  . Smokeless tobacco: Never Used  Vaping Use  . Vaping Use: Never used  Substance and Sexual Activity  . Alcohol use: Yes    Alcohol/week: 0.0 standard drinks    Comment: occ  . Drug use: No  . Sexual activity: Yes    Birth control/protection: Surgical  Other Topics Concern  . Not on file  Social History Narrative   Studying at [employer] in early childhood education.   Married.   Social Determinants of Health   Financial Resource Strain:   .  Difficulty of Paying Living Expenses: Not on file  Food Insecurity:   . Worried About Charity fundraiser in the Last Year: Not on file  . Ran Out of Food in the Last Year: Not on file  Transportation Needs:   . Lack of Transportation (Medical): Not on file  . Lack of Transportation (Non-Medical): Not on file  Physical Activity:   . Days of Exercise per Week: Not on file  . Minutes of Exercise per Session: Not on file  Stress:    . Feeling of Stress : Not on file  Social Connections:   . Frequency of Communication with Friends and Family: Not on file  . Frequency of Social Gatherings with Friends and Family: Not on file  . Attends Religious Services: Not on file  . Active Member of Clubs or Organizations: Not on file  . Attends Archivist Meetings: Not on file  . Marital Status: Not on file  Intimate Partner Violence:   . Fear of Current or Ex-Partner: Not on file  . Emotionally Abused: Not on file  . Physically Abused: Not on file  . Sexually Abused: Not on file    Outpatient Medications Prior to Visit  Medication Sig Dispense Refill  . aspirin EC 81 MG tablet Take 81 mg by mouth daily. Swallow whole.    . Aspirin-Acetaminophen-Caffeine (GOODY HEADACHE PO) Take 1 packet by mouth as needed (headache/pain).    Marland Kitchen atorvastatin (LIPITOR) 40 MG tablet Take 1 tablet by mouth once daily 90 tablet 1  . fluticasone (FLONASE) 50 MCG/ACT nasal spray Place 1-2 sprays into both nostrils daily as needed for allergies or rhinitis.    . Insulin Glargine (BASAGLAR KWIKPEN) 100 UNIT/ML Inject 0.1 mLs (10 Units total) into the skin daily. 3 mL 2  . Insulin Pen Needle (B-D UF III MINI PEN NEEDLES) 31G X 5 MM MISC 1 Stick by Does not apply route daily. 11.69, insulin requirement 30 each 3  . menthol-cetylpyridinium (CEPACOL) 3 MG lozenge Take 1 lozenge by mouth as needed for sore throat. (Patient not taking: Reported on 10/13/2019)    . metoprolol succinate (TOPROL-XL) 200 MG 24 hr tablet Take 1 tablet (200 mg total) by mouth daily. Take with or immediately following a meal. 90 tablet 3  . sacubitril-valsartan (ENTRESTO) 97-103 MG Take 1 tablet by mouth 2 (two) times daily. 60 tablet 11  . sitaGLIPtin (JANUVIA) 100 MG tablet Take 1 tablet (100 mg total) by mouth daily. 90 tablet 0  . spironolactone (ALDACTONE) 25 MG tablet Take 1 tablet (25 mg total) by mouth daily. 30 tablet 11  . SYNJARDY XR 05-998 MG TB24 TAKE 1  BY  MOUTH TWICE DAILY WITH A MEAL 60 tablet 0  . Turmeric (QC TUMERIC COMPLEX PO) Take 1 tablet by mouth daily. (Patient not taking: Reported on 10/13/2019)    . pantoprazole (PROTONIX) 20 MG tablet Take 2 tablets (40 mg total) by mouth daily. 90 tablet 0   No facility-administered medications prior to visit.    Allergies  Allergen Reactions  . Drug Class [Trazodone And Nefazodone]     Developed suicidal ideation within 3 days of starting  . Sertraline Hcl     Developed suicidal ideation within 3d of starting    Review of Systems  Constitutional: Positive for fatigue. Negative for chills and fever.  HENT: Negative for congestion, sinus pain and sore throat.   Eyes: Negative for discharge.  Respiratory: Negative for cough, chest tightness and wheezing.  Cardiovascular: Negative for chest pain and palpitations.  Gastrointestinal: Negative for diarrhea, nausea and vomiting.  Psychiatric/Behavioral: Positive for sleep disturbance. The patient is nervous/anxious.        Objective:    Physical Exam Constitutional:      General: She is not in acute distress.    Appearance: Normal appearance.  Cardiovascular:     Rate and Rhythm: Normal rate and regular rhythm.  Pulmonary:     Effort: Pulmonary effort is normal.     Breath sounds: Normal breath sounds.  Neurological:     Mental Status: She is alert.  Psychiatric:        Mood and Affect: Mood normal.        Behavior: Behavior normal.     BP 132/82 (BP Location: Left Arm, Cuff Size: Large)   Pulse (!) 106   Temp 98.2 F (36.8 C) (Oral)   Ht 5' (1.524 m)   Wt 190 lb 6.4 oz (86.4 kg)   SpO2 98%   BMI 37.18 kg/m  Wt Readings from Last 3 Encounters:  10/28/19 190 lb 6.4 oz (86.4 kg)  10/14/19 184 lb 9.6 oz (83.7 kg)  10/13/19 188 lb (85.3 kg)    Health Maintenance Due  Topic Date Due  . Hepatitis C Screening  Never done  . COVID-19 Vaccine (1) Never done  . FOOT EXAM  10/18/2018  . HEMOGLOBIN A1C  03/06/2019  .  INFLUENZA VACCINE  08/23/2019    There are no preventive care reminders to display for this patient.   Lab Results  Component Value Date   TSH 2.361 05/15/2012   Lab Results  Component Value Date   WBC 7.4 09/14/2019   HGB 12.5 09/14/2019   HCT 37.1 09/14/2019   MCV 97.6 09/14/2019   PLT 481 (H) 09/14/2019   Lab Results  Component Value Date   NA 139 09/14/2019   K 4.3 09/14/2019   CO2 24 09/14/2019   GLUCOSE 224 (H) 09/14/2019   BUN 14 09/14/2019   CREATININE 0.77 09/14/2019   BILITOT 0.8 09/14/2019   ALKPHOS 77 09/14/2019   AST 19 09/14/2019   ALT 24 09/14/2019   PROT 8.2 (H) 09/14/2019   ALBUMIN 3.5 09/14/2019   CALCIUM 10.0 09/14/2019   ANIONGAP 15 09/14/2019   Lab Results  Component Value Date   CHOL 126 07/11/2018   Lab Results  Component Value Date   HDL 32 (L) 07/11/2018   Lab Results  Component Value Date   LDLCALC 65 07/11/2018   Lab Results  Component Value Date   TRIG 143 07/11/2018   Lab Results  Component Value Date   CHOLHDL 3.9 07/11/2018   Lab Results  Component Value Date   HGBA1C 9.9 (A) 12/04/2018       Assessment & Plan:   Problem List Items Addressed This Visit      Digestive   GERD (gastroesophageal reflux disease)   Relevant Medications   pantoprazole (PROTONIX) 20 MG tablet     Other   Anxiety state   Post-COVID syndrome - Primary    Patient presents for follow-up on post-COVID syndrome. Diagnosed with COVID on 8/11. Did not require hospitalization, but required supplemental oxygen for a few weeks. She has continued to experience intermittent shortness of breath, particularly with exertion for which she uses albuterol as needed. Her ambulatory O2 sats have remained stable and no longer requiring supplemental oxygen.  Since her infection, she has developed anxiety that is quite function limiting. Endorses panic attacks  and significant sleep disturbance.  Medications were offered to help treat her symptoms, but she  preferred to try counseling through behavioral health. She has her first appointment with them on 10/8.  Given that post-COVID syndrome has a variety of manifestations and time courses, I am unable to predict when she will be fit to return to work at this time. We will continue to follow her progress with behavioral health.  She will follow-up with primary care physician on 11/25/19.           Meds ordered this encounter  Medications  . pantoprazole (PROTONIX) 20 MG tablet    Sig: Take 2 tablets (40 mg total) by mouth daily.    Dispense:  90 tablet    Refill:  0     Addalie Calles D Cythnia Osmun, DO

## 2019-10-29 NOTE — Assessment & Plan Note (Addendum)
Patient presents for follow-up on post-COVID syndrome. Diagnosed with COVID on 8/11. Did not require hospitalization, but required supplemental oxygen for a few weeks. She has continued to experience intermittent shortness of breath, particularly with exertion for which she uses albuterol as needed. Her ambulatory O2 sats have remained stable and no longer requiring supplemental oxygen.  Since her infection, she has developed anxiety that is quite function limiting. Endorses panic attacks and significant sleep disturbance.  Medications were offered to help treat her symptoms, but she preferred to try counseling through behavioral health. She has her first appointment with them on 10/8.  Given that post-COVID syndrome has a variety of manifestations and time courses, I am unable to predict when she will be fit to return to work at this time. We will continue to follow her progress with behavioral health.  She will follow-up with primary care physician on 11/25/19.

## 2019-10-29 NOTE — Progress Notes (Signed)
Internal Medicine Clinic Attending  Case discussed with Dr. Bloomfield  At the time of the visit.  We reviewed the resident's history and exam and pertinent patient test results.  I agree with the assessment, diagnosis, and plan of care documented in the resident's note.  

## 2019-10-30 ENCOUNTER — Ambulatory Visit (INDEPENDENT_AMBULATORY_CARE_PROVIDER_SITE_OTHER): Payer: BC Managed Care – PPO | Admitting: Licensed Clinical Social Worker

## 2019-10-30 ENCOUNTER — Other Ambulatory Visit: Payer: Self-pay

## 2019-10-30 DIAGNOSIS — F331 Major depressive disorder, recurrent, moderate: Secondary | ICD-10-CM

## 2019-10-30 NOTE — Progress Notes (Signed)
Comprehensive Clinical Assessment (CCA) Note  10/30/2019 Sandra [name] [id number]  Visit Diagnosis:      ICD-10-CM   1. Major depressive disorder, recurrent episode, moderate with anxious distress ([place])  F33.1      CCA Part One   Part One has been completed on paper by the patient.  (See scanned document in Chart Review)   CCA Biopsychosocial  Intake/Chief Complaint:  CCA Intake With Chief Complaint CCA Part Two Date: 10/30/19 CCA Part Two Time: 0900 Chief Complaint/Presenting Problem: "I started having bad anxiety after I got COVID and my doctor gave me some medicine, but then I was having suicidal thoughts until I came off it.  I just want to feel normal again". Patient's Currently Reported Symptoms/Problems: Aqua reported that current depression and anxiety symptoms started shortly after beginning recovery from [place] following confirmed positive diagnosis and treatment on 09/02/19.  [name] reported that finds herself worrying a lot about going back to work, and when anxiety spikes she feels physical symptoms, such as a tingling sensation in her body, racing heart, restlessness, and shortness of breath, which can lead to panic episodes.  She reported that she finds it difficult to fall and stay asleep at night.  [name] reported that the only other instance of depression she has faced in past was post-partum 22 years ago. Individual's Strengths: Supported by husband, believes in higher power, reads scripture each day, whole family is very supportive, has stable housing and on short term disability from work. Individual's Preferences: Sandra [name] reported that this is her first time receiving therapy and she would like to meet in person x1 per week. Individual's Abilities: Motivated for treatment, financially stable, good social support Type of Services Patient Feels Are Needed: Individual therapy and potentially medication management with trusted psychiatrist if symptoms do not resolve  through therapy alone. Initial Clinical Notes/Concerns: Sandra [name] is a 54 year old married African American female that presented today for in person clinical assessment with her husband [name] for support following referral from PCP for therapy to address her increasing depression and anxiety.  [name] presented to session on time and was alert, oriented x5, with no evidence or self-report of active SI/HI or A/V H.  [name] reported that following diagnosis of COVID-19 on 09/02/19 she was placed on supplemental oxygen for a few weeks, but not hospitalized, and after recovering, she was left with shortness of breath and lowered energy.  [name] reported that her experience with COVID was stressful, and she sought medication from PCP (Zoloft and trazodone) to help with her anxiety and sleeplessness, but from 9/18-9/21 she began experiencing panic episodes 3-4x per week with increased heart rate and trouble breathing, along with SI, although she denied any intent or plan.  [name]'s doctor took her off these medications as a result and she has not experienced SI since then, believing it to be medication induced.  [name] reported that she was recently approved for short term disability due to post-COVID syndrome, and will be out of work for foreseeable future.  She denied any hx of abuse or trauma in past, and denied hx of drug or alcohol abuse.  Mental Health Symptoms Depression:  Depression: Fatigue, Hopelessness, Increase/decrease in appetite, Irritability, Sleep (too much or little), Tearfulness, Weight gain/loss, Worthlessness, Duration of symptoms greater than two weeks  Mania:  Mania: None  Anxiety:   Anxiety: Difficulty concentrating, Fatigue, Irritability, Restlessness, Sleep, Tension, Worrying (1 month episode increasing anxiety, worrying about going back to work, having a  job, how she will feel at work or people will act.)  Psychosis:  Psychosis: None  Trauma:  Trauma: None  Obsessions:   Obsessions: None  Compulsions:  Compulsions: None  Inattention:  Inattention: None  Hyperactivity/Impulsivity:  Hyperactivity/Impulsivity: N/A  Oppositional/Defiant Behaviors:  Oppositional/Defiant Behaviors: None  Emotional Irregularity:  Emotional Irregularity: None  Other Mood/Personality Symptoms:      Risk Assessment- Self-Harm Potential: Risk Assessment For Self-Harm Potential Thoughts of Self-Harm: No current thoughts Method: No plan Availability of Means: No access/NA Additional Comments for Self-Harm Potential: [name] reported that she had not experienced SI until starting Zoloft and Trazodone.  Denied any intent or plan when SI appeared, stated "I just didn't want to be alive and suffering anymore".  Denied any SI since discontinuing meds around 10/13/19.      Risk Assessment -Dangerous to Others Potential: Risk Assessment For Dangerous to Others Potential Method: No Plan Availability of Means: No access or NA Intent:  NA Notification Required: No need or identified person  Mental Status Exam Appearance and self-care  Stature:  Stature: Small (5 feet, self-reported.)  Weight:  Weight: Overweight (185lbs, self-reported.)  Clothing:  Clothing: Casual  Grooming:  Grooming: Normal  Cosmetic use:  Cosmetic Use: Age appropriate  Posture/gait:  Posture/Gait: Normal  Motor activity:  Motor Activity: Not Remarkable  Sensorium  Attention:  Attention: Normal  Concentration:  Concentration: Normal  Orientation:  Orientation: X5  Recall/memory:  Recall/Memory: Normal  Affect and Mood  Affect:  Affect: Depressed  Mood:  Mood: Depressed  Relating  Eye contact:  Eye Contact: Normal  Facial expression:  Facial Expression: Depressed  Attitude toward examiner:  Attitude Toward Examiner: Cooperative  Thought and Language  Speech flow: Speech Flow: Clear and Coherent  Thought content:  Thought Content: Appropriate to Mood and Circumstances  Preoccupation:  Preoccupations: None   Hallucinations:  Hallucinations: None  Organization:     Transport planner of Knowledge:  Fund of Knowledge: Average  Intelligence:  Intelligence: Average  Abstraction:  Abstraction: Normal  Judgement:  Judgement: Good  Reality Testing:  Reality Testing: Adequate  Insight:  Insight: Fair  Decision Making:  Decision Making: Normal  Social Functioning  Social Maturity:  Social Maturity: Responsible  Social Judgement:  Social Judgement: Normal  Stress  Stressors:  Stressors: Illness, Transitions, Work  Coping Ability:  Coping Ability: English as a second language teacher Deficits:  Skill Deficits: Self-care  Supports:  Supports: Church, Family, Friends/Service system     Religion: Religion/Spirituality Are You A Religious Person?: Yes What is Your Religious Affiliation?: Baptist How Might This Affect Treatment?: "I know god is helping me, I wouldn't be in this state without him"  Leisure/Recreation: Leisure / Recreation Do You Have Hobbies?: Yes Leisure and Hobbies: "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 watch TV"  Exercise/Diet: Exercise/Diet Do You Exercise?: No Have You Gained or Lost A Significant Amount of Weight in the Past Six Months?: Yes-Lost Number of Pounds Lost?: 15 Do You Follow a Special Diet?: No Do You Have Any Trouble Sleeping?: Yes Explanation of Sleeping Difficulties: Anxiety is interfering at night, hard to fall asleep, wake up alot, 4-5 hours on average   CCA Employment/Education  Employment/Work Situation: Employment / Work Situation Employment situation: Production assistant, 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.) Patient's job has been impacted by current illness: Yes Describe how patient's job has been impacted: "I haven't been able to go and don't know if I can do the job anymore" What is the  longest time patient has a held a job?: 7 years Where was the patient employed at that time?: Current job, [employer] Has patient ever been in the  [employer]?: No  Education: Education Is Patient Currently Attending School?: No Last Grade Completed: 12 Name of [place] Did Teacher, adult education From [employer]?: Yes Did Physicist, medical?: Yes What Type of College Degree Do you Have?: Associate in Childhood Education Did Heritage manager?: No Did You Have Any Special Interests In School?: [employer] Did You Have Any Difficulty At School?: No Patient's Education Has Been Impacted by Current Illness: No   CCA Family/Childhood History  Family and Relationship History: Family history Marital status: Married Number of Years Married: 28 What types of issues is patient dealing with in the relationship?: "Normal stuff, sometimes we get on each others nerves" Additional relationship information: Supportive relationship, look out for each other Are you sexually active?: Yes What is your sexual orientation?: Heterosexual Has your sexual activity been affected by drugs, alcohol, medication, or emotional stress?: There was intial emotional stress due to COVID, has ceased to be a problem. Does patient have children?: Yes How many children?: 1 How is patient's relationship with their children?: 37 year old son; "We have a good relationship"  Childhood History:  Childhood History By whom was/is the patient raised?: Mother Additional childhood history information: Raised primarily by mother, saw dad on occaison. Description of patient's relationship with caregiver when they were a child: Eilah got along well with mother, father was distant. Patient's description of current relationship with people who raised him/her: Father is deceased as of 3 years ago; still very close and supportive relationship with mother. How were you disciplined when you got in trouble as a child/adolescent?: "I got a whooping, nothing abusive though" Does patient have siblings?: Yes Number of Siblings: 9 Description of patient's current  relationship with siblings: "We all have a good relationship, they're all my sisters" Did patient suffer any verbal/emotional/physical/sexual abuse as a child?: No Did patient suffer from severe childhood neglect?: No Has patient ever been sexually abused/assaulted/raped as an adolescent or adult?: No Was the patient ever a victim of a crime or a disaster?: No Witnessed domestic violence?: No Has patient been affected by domestic violence as an adult?: No   CCA Substance Use  Alcohol/Drug Use: Alcohol / Drug Use Pain Medications: Denied. Prescriptions: Diabetic, heart failure; insulin, Jenuvia; was on Zoloft and Trazodone, believe this contributed to SI. Over the Counter: Tylenol, baby Aspirin History of alcohol / drug use?: No history of alcohol / drug abuse     Recommendations for Services/Supports/Treatments: Recommendations for Services/Supports/Treatments Recommendations For Services/Supports/Treatments: Individual Therapy  DSM5 Diagnoses: Patient Active Problem List   Diagnosis Date Noted  . 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 History of COVID-19 10/15/2019  . Dyspnea on exertion 10/15/2019  . GERD (gastroesophageal reflux disease) 10/15/2019  . Left sided numbness 10/15/2019  . Anxiety state 09/21/2019  . COVID-19 09/08/2019  . 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 Right low back pain 03/02/2014  . Psoriasis 04/29/2013  . Adenomyosis 06/03/2012  . Obesity 05/16/2012  . Chronic HFrEF (heart failure with reduced ejection fraction) ([place]) 02/22/2012  . Perimenopausal menorrhagia 02/20/2012  . LBBB (left bundle branch block) 01/24/2012  . Routine adult health maintenance 09/27/2010  . Diabetes mellitus type 2 in obese ([place]) 11/17/2009  . Hyperlipidemia 08/20/2007  . Migraine without aura 09/02/2006  . Essential hypertension 07/08/2006  .  HSV 11/07/2005  . Asthma 11/07/2005    Patient Centered Plan: Meet with  clinician for in person therapy sessions x1 per week to address progress towards goals, as well as barriers to success;  Consider scheduling initial appointment with psychiatrist within next 3 months if symptoms related to depression and anxiety do not resolve through therapy alone;  Reduce average anxiety from 5/10 in severity down to 3/10 and panic episodes from 3 down to 1 in next 90 days via utilization of 3-4 relaxation and/or grounding skills daily when related symptoms arise via techniques such as mindful breathing, progressive muscle relaxation, 5-4-3-2-1 grounding, etc; Reduce depression severity from average of 4/10 down to 2/10 in next 90 days by engaging in 1 hour of positive self-care activities daily; Set aside at least 2-3 hours per week towards socializing with friends and family in order to reduce compartmentalization of feelings, and isolation;  Commit to taking walk to park with husband x1 per week for 20 minutes on average in order to improve both physical and mental health as well as maintain healthy body weight; Implement 3-4 sleep hygiene techniques in next 3 months in order to increase average nightly sleep from 4-5 hours up to normal 8 and reduce related fatigue upon waking; Attend church in person and bible study via phone x1 per week to increase connection with higher power and spiritual community within next 90 days; Keep consistent appointments with PCP to address ongoing medical symptoms related to post COVID syndrome that may negatively impact mood; Plan to return to normal work schedule once short term disability ends in next 30-60 days and self-monitor for ability to engage in previous work duties versus seeking permanent disability; Voluntarily seek hospitalization should SI/HI appear and safety of self and/or others is determined to be at risk due to development of plan/intent to harm.      Referrals to Alternative Service(s): Referred to Alternative Service(s):   Place:   Date:    Time:    Referred to Alternative Service(s):   Place:   Date:   Time:    Referred to Alternative Service(s):   Place:   Date:   Time:    Referred to Alternative Service(s):   Place:   Date:   Time:     Granville Lewis, Deon Pilling 10/30/19

## 2019-11-01 ENCOUNTER — Other Ambulatory Visit: Payer: Self-pay | Admitting: Student

## 2019-11-01 DIAGNOSIS — E1169 Type 2 diabetes mellitus with other specified complication: Secondary | ICD-10-CM

## 2019-11-02 ENCOUNTER — Other Ambulatory Visit: Payer: Self-pay

## 2019-11-02 ENCOUNTER — Ambulatory Visit (HOSPITAL_COMMUNITY)
Admission: RE | Admit: 2019-11-02 | Discharge: 2019-11-02 | Disposition: A | Discharge status: Home / Self Care | Payer: BC Managed Care – PPO | Source: Ambulatory Visit | Attending: Internal Medicine | Admitting: Internal Medicine

## 2019-11-02 DIAGNOSIS — R202 Paresthesia of skin: Secondary | ICD-10-CM | POA: Insufficient documentation

## 2019-11-02 DIAGNOSIS — R200 Anesthesia of skin: Secondary | ICD-10-CM | POA: Diagnosis not present

## 2019-11-03 ENCOUNTER — Telehealth: Payer: Self-pay

## 2019-11-03 ENCOUNTER — Encounter: Payer: Self-pay | Admitting: Internal Medicine

## 2019-11-03 MED ORDER — ELETRIPTAN HYDROBROMIDE 20 MG PO TABS: 20.0000 mg | ORAL_TABLET | ORAL | 0 refills | Status: DC | PRN

## 2019-11-03 NOTE — Telephone Encounter (Signed)
Faxed refill request from [name] for Eletriptan HBR 20mg . Medication has fallen off active med list.  It is noted under medication hx, last filled by Dr. Trilby Drummer w/ end date noted of 09/21/2018. Will forward to blue team for refill consideration. SChaplin, RN,BSN

## 2019-11-03 NOTE — Telephone Encounter (Signed)
Will send refill to take as needed for migraines.

## 2019-11-05 ENCOUNTER — Ambulatory Visit (INDEPENDENT_AMBULATORY_CARE_PROVIDER_SITE_OTHER): Payer: BC Managed Care – PPO | Admitting: Licensed Clinical Social Worker

## 2019-11-05 ENCOUNTER — Other Ambulatory Visit: Payer: Self-pay

## 2019-11-05 DIAGNOSIS — F331 Major depressive disorder, recurrent, moderate: Secondary | ICD-10-CM

## 2019-11-05 NOTE — Progress Notes (Addendum)
THERAPIST PROGRESS NOTE   Session Time: 3:00pm - 4:00pm  Location: Patient: [facility] Provider: [facility]    Participation Level: Active   Behavioral Response: Alert, neatly groomed, casually dressed, depressed affect/mood   Type of Therapy:  Individual Therapy   Treatment Goals addressed: Depression and Anxiety management   Interventions: CBT: cognitive distortions and positive affirmations; referrals for support groups    Summary: Sandra [name] is a 53 year old married African American female that presented for in person therapy session today and is diagnosed with Major Depressive Disorder, recurrent, moderate with anxious distress.        Suicidal/Homicidal: None; without intent or plan   Therapist Response: Clinician met with Sandra [name] for in person therapy session and she was accompanied again by her husband [name] for support.  Clinician assessed for safety, and sobriety.  Sandra [name] presented for appointment on time and was alert, oriented x5, with no evidence or self-report of SI/HI or A/V H.  Sandra [name] denied any use of alcohol or illicit substances.  Clinician inquired about Sandra [name]'s emotional ratings today, as well as any significant changes in thoughts, feelings, or behavior since completion of assessment.  Sandra [name] reported scores of 7/10 for depression and 4/10 for anxiety, and noted that she did have 1 panic episode on the 11th after speaking with a friend about mutual struggles with medical issues.  Clinician discussed cognitive behavioral model with Sandra [name] today, and applied this to her recent panic event, highlighting influence that this situation had upon her thoughts, feelings, and behavior as a result.  Clinician also discussed cognitive distortions or 'stinking thinking' which could be present, and worsen feelings of depression and anxiety if left unchallenged.  Clinician discussed benefits of positive affirmations with Sandra [name] that could be practiced daily as a  substitute for irrational thoughts in order to change maladaptive thinking patterns influencing mood.  Interventions were effective, as evidenced by Sandra [name] reporting that she does often times engage in distortions such as magnification of problems, and catastrophizing, providing example such as "I am always going to suffer from this problem" and noting that this makes her feel anxious, sad, and triggers panic episodes as a result.  Sandra [name] was receptive to positive affirmations that were offered and wrote down several of these to practice which resonated with her, such as "This too shall pass", "I get better every day in every way", and "If I can conceive it and believe it, I can achieve it", which all made her feel more hopeful and happy.  Clinician also provided referral to [facility] [place] for support groups Sandra [name] might find helpful for addressing lack of support with relatable peers.  Sandra [name] reported that she would look into the 'Life after Covid' and '[employer] Women's Support' groups, as both appeared appealing to her.  Clinician will continue to monitor.                       Plan: Meet again in 1 week.   Diagnosis: Major Depressive Disorder, recurrent, moderate with anxious distress.   Cory  Bates, LCSW, LCAS 11/05/19 

## 2019-11-05 NOTE — Progress Notes (Signed)
Internal Medicine Clinic Attending  Case discussed with Dr. Christian  At the time of the visit.  We reviewed the resident's history and exam and pertinent patient test results.  I agree with the assessment, diagnosis, and plan of care documented in the resident's note.  

## 2019-11-06 ENCOUNTER — Encounter: Payer: BC Managed Care – PPO | Admitting: Internal Medicine

## 2019-11-11 ENCOUNTER — Ambulatory Visit (INDEPENDENT_AMBULATORY_CARE_PROVIDER_SITE_OTHER): Payer: BC Managed Care – PPO | Admitting: Student

## 2019-11-11 ENCOUNTER — Encounter: Payer: Self-pay | Admitting: *Deleted

## 2019-11-11 ENCOUNTER — Other Ambulatory Visit: Payer: Self-pay

## 2019-11-11 DIAGNOSIS — F411 Generalized anxiety disorder: Secondary | ICD-10-CM | POA: Diagnosis not present

## 2019-11-11 MED ORDER — ESCITALOPRAM OXALATE 10 MG PO TABS: 10.0000 mg | ORAL_TABLET | Freq: Every day | ORAL | 2 refills | Status: DC

## 2019-11-11 NOTE — Progress Notes (Signed)
   CC: Anxiety  This is a telephone encounter between [employer] and Sandra [name] on 11/11/2019 for anxiety and associated tingling in there left back. The visit was conducted with the patient located at home and Sandra [name] at [facility]. The patient's identity was confirmed using their DOB and current address. The patient has consented to being evaluated through a telephone encounter and understands the associated risks (an examination cannot be done and the patient may need to come in for an appointment) / benefits (allows the patient to remain at home, decreasing exposure to coronavirus). I personally spent 15 minutes on medical discussion.   HPI:  Sandra [name] is a 54 y.o. with PMH as below.   Please see A&P for assessment of the patient's acute and chronic medical conditions.   Past Medical History:  Diagnosis Date  . Acute pain of left shoulder 06/07/2017  . Asthma    as a child  . Asthma 11/07/2005   Reported Diagnosis in 2012, no formal spirometry   . Cardiomyopathy ([place])    likely nonischemic DCM per Dr. Johnsie Cancel note 04/2017  . Carpal tunnel syndrome   . Chest pain 07/11/2018  . Chronic HFrEF (heart failure with reduced ejection fraction) ([place]) 02/22/2012  . Diabetes mellitus type 2 in obese ([place]) 11/17/2009   - Synjardy 05-998 BID, Januvia 100mg  Daily,  Basaglar - Failed to tolerate Victoza  . Diabetes mellitus type 2, uncontrolled ([facility]) DX: 1999   Started as gestational diabetes  . ESSENTIAL HYPERTENSION 07/08/2006  . Essential hypertension 07/08/2006  . 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 Gallstones   . HSV 11/07/2005   Qualifier: Diagnosis of  By: Pearline Cables MD, Belenda Cruise    . HSV (herpes simplex virus) anogenital infection   . HYPERLIPIDEMIA  08/20/2007  . Hyperlipidemia 08/20/2007   Atorvastatin 40mg  Daily Last Lipid panel may 2019: Total 138, HDL 31, LDL 89. ASCVD risk of 14.5%   . LBBB (left bundle branch block)   . Menorrhagia   . Migraine    Normal CT head (08/06/2006)  . Migraine without aura 09/02/2006   Fairly Well-controlled  2 per month  Takes Elitriptan   . 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 Perimenopausal menorrhagia 02/20/2012  . Psoriasis 04/29/2013  . Right low back pain 03/02/2014  . Routine adult health maintenance 09/27/2010  . Swelling of abdominal wall 03/02/2014  . Swelling of abdominal wall 03/02/2014  . Symptoms of upper respiratory infection (URI) 04/01/2018  . Viral URI with cough 01/13/2013   Review of Systems: Negative as per HPI    Assessment & Plan:   See Encounters Tab for problem based charting.  Patient seen with Dr. Dareen Piano

## 2019-11-12 ENCOUNTER — Ambulatory Visit (INDEPENDENT_AMBULATORY_CARE_PROVIDER_SITE_OTHER): Payer: BC Managed Care – PPO | Admitting: Licensed Clinical Social Worker

## 2019-11-12 DIAGNOSIS — F331 Major depressive disorder, recurrent, moderate: Secondary | ICD-10-CM | POA: Diagnosis not present

## 2019-11-12 NOTE — Progress Notes (Signed)
THERAPIST PROGRESS NOTE   Session Time: 3:00pm - 3:45pm  Location: Patient: [facility] [place] [facility] Provider: [facility] [place] [facility]    Participation Level: Active   Behavioral Response: Alert, neatly groomed, casually dressed, depressed affect/mood   Type of Therapy:  Individual Therapy   Treatment Goals addressed: Depression and Anxiety management; Psychiatry appointment and medication management    Interventions: CBT, progressive muscle relaxation    Summary: Sandra [name] is a 54 year old married African American female that presented for in person therapy session today and is diagnosed with Major Depressive Disorder, recurrent, moderate with anxious distress.        Suicidal/Homicidal: None; without intent or plan   Therapist Response: Clinician met with Sandra [name] for in person therapy appointment and she was accompanied by her husband [name] for support.  Clinician assessed for safety, and sobriety.  Sandra [name] presented for this session on time and was alert, oriented x5, with no evidence or self-report of SI/HI or A/V H.  Sandra [name] denied any use of alcohol or illicit substances.  Clinician inquired about Sandra [name]'s current emotional ratings, as well as any significant changes in thoughts, feelings, or behavior since last check-in.  Sandra [name] reported scores of 3/10 for depression and 2/10 for anxiety, and noted that her last panic attack was on Friday when she began to experience tension in her body, a tingling sensation, and crying spell.  Sandra [name] reported that she prayed with her sister, hugged her, tried to control her breathing, and then took OTC pain medication before falling asleep.  Sandra [name] reported that she followed up with her doctor about this episode and received a new prescription for Lexapro, but has not taken it because she saw that SI could be a side effect, and worried about a reoccurrence of similar issues due to experience she had with Zoloft and Trazodone a few weeks ago.  Clinician  empathized with Teva and inquired about whether she would be open to setting an appointment with psychiatrist that could go over pros and cons of medications in more detail to help with making decision.  Sandra [name] reported that her current doctor also suggested this, and she would set an appointment after today's session, stating "I'm going to call her tomorrow too and ask about it, because I don't want to live in fear, but I need something to help me calm down".  Clinician offered to cover some relaxation skills with Sandra [name] today to assist with this goal, and provided options on ones that might be appealing to her.  Sandra [name] expressed interest in practicing progressive muscle relaxation due to tension she experiences during panic episodes, so clinician guided her through process of getting comfortable, achieving relaxing breathing pattern, and then going through the different muscle groups (I.e. hands, forearms, shoulders, facial muscles, legs, back, etc) in sequential order, tensing them for 5 seconds and then relaxing for 10 seconds in order to identify where she might be retaining stress and release it.  Interventions were effective, as evidenced by Sandra [name] participating in activity successfully and reporting that she did feel more relaxed afterward and aware of areas where she is retaining tension, such as her back and shoulders.  She stated "This made me realize how tense your body can get and how to work it out".  She reported that she would plan to practice this x3 per week in the morning to develop a routine, and has also continued use of positive affirmations as recommended.  Clinician will continue to monitor.  Plan: Meet again in 1 week.   Diagnosis: Major Depressive Disorder, recurrent, moderate with anxious distress.   [name], LCSW, LCAS 11/12/19

## 2019-11-12 NOTE — Assessment & Plan Note (Addendum)
Patient continue to experience period of anxiety with SOB, feeling of impending doom, and anxious thoughts. These episode last about 30 minutes and are associated with tingling of the left back. State she continue to have these episodes several times a day. She has met with counselor a week and has been trying to redirect irrational though during these episodes which has helped her through these episodes. However she continue to experience emotional distress.  She denies chest pain, nausea, and weakness and headaches. She is not having though of harming herself and has believes she would never hurt herself, but does feel experience feeling of impending doom during these episodes and would like these to stop. She was previously on trazodone and sertraline but developed SI while on medication and was subsequently discontinued. Has spoke family and friend who have anxiety and would like to try another medication in addition to seeing behavorial health. Will start her on low dose of lexapro and advised her that the medication may take several weeks before she feel improvement.Given she continues to have significant anxiety and depression since her COVID-19 infection and difficulties tolerating medications she may also benefit from psychiatric evaluation.   Plan Start escitalopram 10 mg daily and follow up in 1 month Referral to psychiatry  Continue to follow up with [facility] Follow up with cardiology on 11/18/2019 for her symptoms

## 2019-11-16 NOTE — Progress Notes (Signed)
Evaluation Performed:  Follow-up visit  Date:  11/18/2019   ID:  Sandra [name], DOB 12-17-1965, MRN [id number]  Provider Location: Office  PCP:  Andrew Au, MD  Cardiologist:  Johnsie Cancel Electrophysiologist:  None   Chief Complaint:   Dyspnea  History of Present Illness:    Sandra [name] is a 54 y.o. female with DM, HLD, HTN first seen 04/26/17 for dyspnea. Also had atypical chest pain with LBBB on ECG ER visit r/o normal CXR  History of NIDCM 2014 EF 45% Echo 05/09/17 same 40-45% no significant valve disease or pulmonary hypertension Myovue 05/21/27 normal  Estimated EF 38%  Done well on ACE/Diuretic/beta blocker / statin and ASA She sees behavioral health for anxiety / depression Started on low dose Lexapro 11/11/19    8 yo mom living with her Son turns 24 working at [employer] Discussed compliance with meds as she misses doses on occasion  Functional class one TTE 10/22/19 with EF 40% stable   COVID positive August 2021 CT 10/08/19 reviewed IMPRESSION: 1. No evidence of acute pulmonary embolism. 2. Improvement in bibasilar predominant airspace consolidative opacities. 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     Past Medical History:  Diagnosis Date  . Acute pain of left shoulder 06/07/2017  . Asthma    as a child  . Asthma 11/07/2005   Reported Diagnosis in 2012, no formal spirometry   . 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 Johnsie Cancel note 04/2017  . Carpal tunnel syndrome   . Chest pain 07/11/2018  . Chronic HFrEF (heart failure with reduced ejection fraction) ([place]) 02/22/2012  . Diabetes mellitus type 2 in obese ([place]) 11/17/2009   - Synjardy 05-998 BID, Januvia 100mg  Daily,  Basaglar - Failed to tolerate Victoza  . Diabetes mellitus type 2, uncontrolled (HCC) DX: 1999   Started as gestational  diabetes  . ESSENTIAL HYPERTENSION 07/08/2006  . Essential hypertension 07/08/2006  . 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 Gallstones   . HSV 11/07/2005   Qualifier: Diagnosis of  By: Pearline Cables MD, Belenda Cruise    . HSV (herpes simplex virus) anogenital infection   . HYPERLIPIDEMIA 08/20/2007  . Hyperlipidemia 08/20/2007   Atorvastatin 40mg  Daily Last Lipid panel may 2019: Total 138, HDL 31, LDL 89. ASCVD risk of 14.5%   . LBBB (left bundle branch block)   . Menorrhagia   . Migraine    Normal CT head (08/06/2006)  . Migraine without aura 09/02/2006   Fairly Well-controlled  2 per month  Takes Elitriptan   . 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 Perimenopausal menorrhagia 02/20/2012  . Psoriasis 04/29/2013  . Right low back pain 03/02/2014  . Routine adult health maintenance 09/27/2010  . Swelling of abdominal wall 03/02/2014  . Swelling of abdominal wall 03/02/2014  . Symptoms of upper respiratory infection (URI) 04/01/2018  . Viral URI with cough 01/13/2013   Past Surgical History:  Procedure Laterality Date  . CHOLECYSTECTOMY  2009  . DILATATION & CURETTAGE/HYSTEROSCOPY WITH MYOSURE N/A 12/25/2017   Procedure: DILATATION & CURETTAGE/HYSTEROSCOPY   POLYPECTOMY WITH MYOSURE;  Surgeon: Charyl Bigger, MD;  Location: [facility];  Service: Gynecology;  Laterality: N/A;  . TUBAL LIGATION       No outpatient medications have been marked as taking for the 11/18/19 encounter (Appointment) with Josue Hector,  MD.     Allergies:   Drug class [trazodone and nefazodone] and Sertraline hcl   Social History   Tobacco Use  . Smoking status: Never Smoker  . Smokeless tobacco: Never Used  Vaping Use  . Vaping Use: Never used  Substance Use Topics  . Alcohol use:  Yes    Alcohol/week: 0.0 standard drinks    Comment: occ  . Drug use: No     Family Hx: The patient's family history includes Diabetes in her father; Hypertension in her father.  ROS:   Please see the history of present illness.     All other systems reviewed and are negative.   Prior CV studies:   The following studies were reviewed today:  CXR 12/12/17 TTE 05/09/17 Myovue 05/20/17 TTE 10/22/19   Labs/Other Tests and Data Reviewed:    EKG:   SR IVCD rate 96 03/19/17  Recent Labs: 09/14/2019: ALT 24; B Natriuretic Peptide 84.6; BUN 14; Creatinine, Ser 0.77; Hemoglobin 12.5; Platelets 481; Potassium 4.3; Sodium 139 10/28/2019: TSH 1.600   Recent Lipid Panel Lab Results  Component Value Date/Time   CHOL 126 07/11/2018 04:30 AM   CHOL 138 06/06/2017 03:13 PM   TRIG 143 07/11/2018 04:30 AM   HDL 32 (L) 07/11/2018 04:30 AM   HDL 31 (L) 06/06/2017 03:13 PM   CHOLHDL 3.9 07/11/2018 04:30 AM   LDLCALC 65 07/11/2018 04:30 AM   LDLCALC 89 06/06/2017 03:13 PM    Wt Readings from Last 3 Encounters:  10/28/19 190 lb 6.4 oz (86.4 kg)  10/14/19 184 lb 9.6 oz (83.7 kg)  10/13/19 188 lb (85.3 kg)     Objective:    Vital Signs:  There were no vitals taken for this visit.   Affect appropriate Healthy:  appears stated age 65: normal Neck supple with no adenopathy JVP normal no bruits no thyromegaly Lungs clear with no wheezing and good diaphragmatic motion Heart:  S1/S2 no murmur, no rub, gallop or click PMI normal Abdomen: benighn, BS positve, no tenderness, no AAA no bruit.  No HSM or HJR Distal pulses intact with no bruits No edema Neuro non-focal Skin warm and dry No muscular weakness   ASSESSMENT & PLAN:    1. DCM:  Stable on medical Rx functional class one TTE 10/22/19 with EF 40% improved Continue Toprol, entresto and aldactone  2. HLD continue statin labs with primary 3. DM:  Discussed low carb diet.  Target hemoglobin A1c is 6.5 or less.  Continue current  medications. 4. Anxiety/Depression:  On Lexapro f/u with psychiatry  5. COVID:  Had infusion needs to get first vaccine next month Refer to pulmonary for long term f/u   COVID-19 Education: The signs and symptoms of COVID-19 were discussed with the patient and how to seek care for testing (follow up with PCP or arrange E-visit).  The importance of social distancing was discussed today.  Time:   Today, I have spent 30 minutes with the patient    Medication Adjustments/Labs and Tests Ordered: Current medicines are reviewed at length with the patient today.  Concerns regarding medicines are outlined above.   Tests Ordered:  None   Medication Changes: No orders of the defined types were placed in this encounter.   Disposition:  Follow up in a year Refer to pulmonary post COVID   Signed, Jenkins Rouge, MD  11/18/2019 9:40 AM    [place]

## 2019-11-17 ENCOUNTER — Other Ambulatory Visit (HOSPITAL_COMMUNITY): Payer: BC Managed Care – PPO

## 2019-11-18 ENCOUNTER — Ambulatory Visit (INDEPENDENT_AMBULATORY_CARE_PROVIDER_SITE_OTHER): Payer: BC Managed Care – PPO | Admitting: Cardiovascular Disease

## 2019-11-18 ENCOUNTER — Telehealth: Payer: Self-pay

## 2019-11-18 ENCOUNTER — Other Ambulatory Visit: Payer: Self-pay

## 2019-11-18 ENCOUNTER — Encounter: Payer: Self-pay | Admitting: Cardiovascular Disease

## 2019-11-18 VITALS — BP 140/78 | HR 97 | Ht 61.0 in | Wt 192.2 lb

## 2019-11-18 DIAGNOSIS — Z7189 Other specified counseling: Secondary | ICD-10-CM | POA: Diagnosis not present

## 2019-11-18 DIAGNOSIS — J189 Pneumonia, unspecified organism: Secondary | ICD-10-CM | POA: Diagnosis not present

## 2019-11-18 NOTE — Patient Instructions (Addendum)
Medication Instructions:  *If you need a refill on your cardiac medications before your next appointment, please call your pharmacy*  Lab Work: If you have labs (blood work) drawn today and your tests are completely normal, you will receive your results only by: Marland Kitchen MyChart Message (if you have MyChart) OR . A paper copy in the mail If you have any lab test that is abnormal or we need to change your treatment, we will call you to review the results.  Testing/Procedures: None ordered today.   Follow-Up: At [facility], you and your health needs are our priority.  As part of our continuing mission to provide you with exceptional heart care, we have created designated Provider Care Teams.  These Care Teams include your primary Cardiologist (physician) and Advanced Practice Providers (APPs -  Physician Assistants and Nurse Practitioners) who all work together to provide you with the care you need, when you need it.  We recommend signing up for the patient portal called "MyChart".  Sign up information is provided on this After Visit Summary.  MyChart is used to connect with patients for Virtual Visits (Telemedicine).  Patients are able to view lab/test results, encounter notes, upcoming appointments, etc.  Non-urgent messages can be sent to your provider as well.   To learn more about what you can do with MyChart, go to [email or web address].    Your next appointment:   6 month(s)  The format for your next appointment:   In Person  Provider:   You may see Jenkins Rouge, MD or one of the following Advanced Practice Providers on your designated Care Team:    Truitt Merle, NP  Cecilie Kicks, NP  Kathyrn Drown, NP  You have been referred to Pulmonology.

## 2019-11-18 NOTE — Telephone Encounter (Signed)
Spoke with patient today, informed her FMLA has been completed and faxed.

## 2019-11-19 ENCOUNTER — Ambulatory Visit (INDEPENDENT_AMBULATORY_CARE_PROVIDER_SITE_OTHER): Payer: BC Managed Care – PPO | Admitting: Licensed Clinical Social Worker

## 2019-11-19 DIAGNOSIS — F331 Major depressive disorder, recurrent, moderate: Secondary | ICD-10-CM

## 2019-11-19 NOTE — Progress Notes (Signed)
THERAPIST PROGRESS NOTE   Session Time: 3:00pm - 4:00pm  Location: Patient: [facility] [place] [facility] Provider: [facility] [place] [facility]    Participation Level: Active   Behavioral Response: Alert, neatly groomed, casually dressed, anxious affect/mood   Type of Therapy:  Individual Therapy   Treatment Goals addressed: Depression and Anxiety management; Psychiatry appointment and medication management    Interventions: CBT, 5-4-3-2-1 grounding   Summary: Angelmarie Ponzo is a 54 year old married African American female that presented for in person therapy session today and is diagnosed with Major Depressive Disorder, recurrent, moderate with anxious distress.        Suicidal/Homicidal: None; without intent or plan   Therapist Response: Clinician met with Erina for in person therapy appointment and assessed for safety, and sobriety.  Zahara presented for appointment on time and was alert, oriented x5, with no evidence or self-report of SI/HI or A/V H.  Shevette reported that she is taking only one Hydroxyzine in the evening, as she remains concerned about side effects.  She denied any use of alcohol or illicit substances.  Clinician inquired about Curtistine's emotional ratings today, as well as any significant changes in thoughts, feelings, or behavior since previous check-in.  Lenell reported scores of 4/10 for depression and 5/10 for anxiety, and denied any reoccurrence of panic attacks, although she disclosed that she was feeling 'on edge' this morning, and stated "It seems like the feeling has kicked in more today".  Aza reported that she is allowed to take one hydroxyzine every 6 hours, and wondered if it might be wearing off early, leading to the increased anxiety.  Clinician encouraged Annet to take these according to MD recommendations, and inquired about whether she has followed up on a psychiatry appointment yet.  Debroh reported that she has not, but would plan to contact insurance this week for list  of providers in network that can discuss availability with her.  Clinician inquired about Tarynn's successes and struggles in past week.  Cyrah reported that she has modified her morning self care routine to include progressive muscle relaxation, and this has alleviated some tension, although she is concerned about returning to work and lacking coping skills to handle anxiety spikes.  Clinician offered to discuss grounding techniques with [place] today which she could use to temporarily distract from distressing thoughts and/or feelings until she feels more stable, starting with 5-4-3-2-1 grounding.  Zarya was agreeable to this suggestion, so clinician invited her to get comfortable, achieve a relaxed breathing pattern, and then list out loud 5 things she could see, 4 things she could touch, 3 things she could hear, 2 she could smell, and 1 she could taste in order to use senses to distract.  Interventions were effective, as evidenced by [name] completing this activity successfully and reporting that she thinks it will be helpful for improving copings ability, and plans to practice it a few times per week.  Clinician will continue to monitor.                       Plan: Meet again in 1 week.   Diagnosis: Major Depressive Disorder, recurrent, moderate with anxious distress.   [name], LCSW, LCAS 11/19/19

## 2019-11-23 NOTE — Progress Notes (Signed)
Internal Medicine Clinic Attending  I saw and evaluated the patient.  I personally confirmed the key portions of the history and exam documented by Dr. Liang and I reviewed pertinent patient test results.  The assessment, diagnosis, and plan were formulated together and I agree with the documentation in the resident's note.  

## 2019-11-25 ENCOUNTER — Encounter: Payer: BC Managed Care – PPO | Admitting: Student

## 2019-11-26 ENCOUNTER — Ambulatory Visit (INDEPENDENT_AMBULATORY_CARE_PROVIDER_SITE_OTHER): Payer: BC Managed Care – PPO | Admitting: Student

## 2019-11-26 ENCOUNTER — Other Ambulatory Visit: Payer: Self-pay

## 2019-11-26 ENCOUNTER — Encounter: Payer: Self-pay | Admitting: Student

## 2019-11-26 DIAGNOSIS — F411 Generalized anxiety disorder: Secondary | ICD-10-CM | POA: Diagnosis not present

## 2019-11-26 MED ORDER — HYDROXYZINE HCL 10 MG PO TABS: 10.0000 mg | ORAL_TABLET | Freq: Three times a day (TID) | ORAL | 1 refills | Status: DC | PRN

## 2019-11-26 MED ORDER — ESCITALOPRAM OXALATE 5 MG PO TABS: 5.0000 mg | ORAL_TABLET | Freq: Every day | ORAL | 0 refills | Status: DC

## 2019-11-26 NOTE — Assessment & Plan Note (Signed)
Since her COVID-19 infection in August, patient has had episodes of anxiety with shortness of breath, feeling the pending doom, and anxious thoughts.  These last about 30 minutes at a time and associated with tingling in the left back.  Continues to have the several times a day.  She has been seeing a counselor weekly and working on behavioral strategies which have been very helpful.  Was also referred to a psychiatrist, has not had a chance yet to research the different options and choose one, but will do so soon.  As far as medications, she had Zoloft and trazodone from the ED provider, but experienced suicidal thoughts while on these and so stopped those medications.  Was prescribed Lexapro by Dr. Milbert Coulter at last visit but noticed the warning of possible suicidal ideation on the box so did not take these.  She has instead been taking hydroxyzine twice a day was prescribed to her at the ED previously.  This has been helpful.  Discussed with her that we would like her dobutamine his medication to use hydroxyzine as needed.  Denies any current SI, HI, hallucinations.  -Start Lexapro at 5 mg daily.  Discussed with patient possible risks and benefits, and will start at a low dose and titrate up as tolerated.  She will stop if she experiences any suicidal ideation. -Refill hydroxyzine.  Discussed that this should be used as needed and to try and taper use over time. -Continue therapy -Patient will make appointment with psychiatry -Will fill out FMLA paperwork -Follow-up in 2 weeks

## 2019-11-26 NOTE — Patient Instructions (Signed)
Thank you for allowing [name] to be a part of your care today, it was a pleasure seeing you. We discussed your ID.  I have made these changes to your medications: Start Lexapro 5 mg daily Refill hydroxyzine 10 mg, 3 times a day as needed  For your anxiety, sounds like you are making progress.  We will start Lexapro which I intended to be a maintenance medication.  We will start at a low dose to avoid side effects, and increase at our next visit if tolerated.  If you experience any feelings of suicidal ideation, stop this medication immediately.  I have also refilled her hydroxyzine, please use this as needed and try to decrease over time if possible.  Please continue seeing her therapist and using those coping mechanisms.  Please also make an appointment with psychiatry when she have decided on one.  I will fill out your FMLA paperwork.  Please follow up in 2 weeks.  We will make medication changes at that time if possible.   Thank you, and please call the Internal Medicine Clinic at [phone] if you have any questions.  Best, Dr. Bridgett Larsson

## 2019-11-26 NOTE — Progress Notes (Signed)
Left message with patient to call back to discuss results.

## 2019-11-26 NOTE — Progress Notes (Signed)
  [facility] Internal Medicine Residency Telephone Encounter Continuity Care Appointment  HPI:   This telephone encounter was created for [name] on 11/26/2019 for the following purpose/cc - panic attacks following COVID infection.   Past Medical History:  Past Medical History:  Diagnosis Date  . Acute pain of left shoulder 06/07/2017  . Asthma    as a child  . Asthma 11/07/2005   Reported Diagnosis in 2012, no formal spirometry   . Cardiomyopathy ([place])    likely nonischemic DCM per Dr. Johnsie Cancel note 04/2017  . Carpal tunnel syndrome   . Chest pain 07/11/2018  . Chronic HFrEF (heart failure with reduced ejection fraction) ([place]) 02/22/2012  . Diabetes mellitus type 2 in obese ([place]) 11/17/2009   - Synjardy 05-998 BID, Januvia 100mg  Daily,  Basaglar - Failed to tolerate Victoza  . Diabetes mellitus type 2, uncontrolled (HCC) DX: 1999   Started as gestational diabetes  . ESSENTIAL HYPERTENSION 07/08/2006  . Essential hypertension 07/08/2006  . 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 Gallstones   . HSV 11/07/2005   Qualifier: Diagnosis of  By: Pearline Cables MD, Belenda Cruise    . HSV (herpes simplex virus) anogenital infection   . HYPERLIPIDEMIA 08/20/2007  . Hyperlipidemia 08/20/2007   Atorvastatin 40mg  Daily Last Lipid panel may 2019: Total 138, HDL 31, LDL 89. ASCVD risk of 14.5%   . LBBB (left bundle branch block)   . Menorrhagia   . Migraine    Normal CT head (08/06/2006)  . Migraine without aura 09/02/2006   Fairly Well-controlled  2 per month  Takes Elitriptan   . 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 Perimenopausal menorrhagia 02/20/2012  . Psoriasis 04/29/2013  . Right low back pain 03/02/2014  . Routine adult health maintenance 09/27/2010  .  Swelling of abdominal wall 03/02/2014  . Swelling of abdominal wall 03/02/2014  . Symptoms of upper respiratory infection (URI) 04/01/2018  . Viral URI with cough 01/13/2013      ROS:  Review of Systems  Respiratory: Positive for shortness of breath.   Musculoskeletal: Positive for back pain.  Neurological: Positive for sensory change.  Psychiatric/Behavioral: Positive for depression. Negative for hallucinations and suicidal ideas. The patient is nervous/anxious.   All other systems reviewed and are negative.      Assessment / Plan / Recommendations:   Please see A&P under problem oriented charting for assessment of the patient's acute and chronic medical conditions.   As always, pt is advised that if symptoms worsen or new symptoms arise, they should go to an urgent care facility or to to ER for further evaluation.   Consent and Medical Decision Making:   Patient seen with Dr. Jimmye Norman  This is a telephone encounter between [name] and Andrew Au on 11/26/2019 for panic attacks. The visit was conducted with the patient located at home and Andrew Au at [facility]. The patient's identity was confirmed using their DOB and current address. The patient has consented to being evaluated through a telephone encounter and understands the associated risks (an examination cannot be done and the patient may need to come in for an appointment) / benefits (allows the patient to remain at home, decreasing exposure to coronavirus). I personally spent 25 minutes on medical discussion.

## 2019-11-30 ENCOUNTER — Encounter: Payer: BC Managed Care – PPO | Admitting: Student

## 2019-12-01 NOTE — Progress Notes (Signed)
Internal Medicine Clinic Attending  Case discussed with Dr. Bridgett Larsson  at the time of this telehealth visit.  We reviewed the resident's history and agree with the assessment, diagnosis, and plan of care documented in the resident's note.

## 2019-12-02 ENCOUNTER — Ambulatory Visit (HOSPITAL_COMMUNITY): Payer: BC Managed Care – PPO | Admitting: Licensed Clinical Social Worker

## 2019-12-02 ENCOUNTER — Other Ambulatory Visit: Payer: Self-pay

## 2019-12-02 DIAGNOSIS — F331 Major depressive disorder, recurrent, moderate: Secondary | ICD-10-CM

## 2019-12-02 NOTE — Progress Notes (Signed)
Virtual Visit via Video Note   I connected with Sandra [name] on 12/02/19 at 3:00pm by video enabled telemedicine application and verified that I am speaking with the correct person using two identifiers.   I discussed the limitations, risks, security and privacy concerns of performing an evaluation and management service by video and the availability of in person appointments. I also discussed with the patient that there may be a patient responsible charge related to this service. The patient expressed understanding and agreed to proceed.   I discussed the assessment and treatment plan with the patient. The patient was provided an opportunity to ask questions and all were answered. The patient agreed with the plan and demonstrated an understanding of the instructions.   The patient was advised to call back or seek an in-person evaluation if the symptoms worsen or if the condition fails to improve as anticipated.   I provided 5 minutes of non-face-to-face time during this encounter.     [name], LCSW, LCAS _______________________________ THERAPIST PROGRESS NOTE   Session Time: 3:00pm - 3:05pm  Location: Patient: Patient home Provider: [facility] [place] [facility]    Participation Level: Active   Behavioral Response: Alert, neatly groomed, casually dressed, anxious affect/mood   Type of Therapy:  Individual Therapy   Treatment Goals addressed: Depression and Anxiety management; Medication management    Interventions: CBT   Summary: Sandra [name] is a 54 year old married African American female that presented for virtual therapy session today and is diagnosed with Major Depressive Disorder, recurrent, moderate with anxious distress.        Suicidal/Homicidal: None; without intent or plan   Therapist Response: Clinician met with Sandra [name] for virtual appointment and assessed for safety, and sobriety.  Sandra [name] presented for today's session on time and was alert, oriented x5, with no evidence or  self-report of SI/HI or A/V H.  Sandra [name] reported that she has been compliant with medications and denied any use of alcohol or illicit substances.  Clinician inquired about Sandra [name] emotional state today, as she sounded fatigued and was observed laying sideways on couch resting.  Sandra [name] reported that she began a new medication, which has led to side effects such as nausea and diarrhea over past few days.  Sandra [name] reported that she would prefer to cancel appointment today due to the level of discomfort these symptoms were causing her.  Clinician expressed understanding, encouraged Sandra [name] to follow her doctor's recommendations in treating these symptoms, and informed front desk of cancellation.  Clinician encouraged Sandra [name] to seek medical attention if condition worsens and will continue to monitor.                       Plan: Meet again in 1 week.   Diagnosis: Major Depressive Disorder, recurrent, moderate with anxious distress.   [name], [place], LCAS 12/02/19

## 2019-12-07 ENCOUNTER — Other Ambulatory Visit: Payer: Self-pay | Admitting: Student

## 2019-12-07 DIAGNOSIS — E1169 Type 2 diabetes mellitus with other specified complication: Secondary | ICD-10-CM

## 2019-12-09 ENCOUNTER — Ambulatory Visit (INDEPENDENT_AMBULATORY_CARE_PROVIDER_SITE_OTHER): Payer: BC Managed Care – PPO | Admitting: Licensed Clinical Social Worker

## 2019-12-09 ENCOUNTER — Other Ambulatory Visit: Payer: Self-pay

## 2019-12-09 DIAGNOSIS — F331 Major depressive disorder, recurrent, moderate: Secondary | ICD-10-CM

## 2019-12-09 NOTE — Progress Notes (Addendum)
THERAPIST PROGRESS NOTE   Session Time: 9:00am - 9:30am  Location: Patient: [facility] [place] [facility]  Provider: [facility] [place] [facility]    Participation Level: Active   Behavioral Response: Alert, neatly groomed, casually dressed, depressed affect/mood   Type of Therapy:  Individual Therapy   Treatment Goals addressed: Depression and Anxiety management; Medication management; Psychiatry referral     Interventions: CBT, guided meditation    Summary: Sandra [name] is a 54 year old married African American female that presented for in-person therapy session today and is diagnosed with Major Depressive Disorder, recurrent, moderate with anxious distress.        Suicidal/Homicidal: None; without intent or plan   Therapist Response: Clinician met with Sandra [name] for in-person therapy session and assessed for safety, medication compliance, and sobriety.  Sandra [name] presented for today's appointment on time and was alert, oriented x5, with no evidence or self-report of SI/HI or A/V H.  Sandra [name] reported compliance with medications and denied any use of alcohol or illicit substances.  Clinician inquired about Sandra [name]'s current emotional ratings today, as well as any significant changes in thoughts, feelings, or behavior since last check-in.  Sandra [name] reported scores of 5/10 for both depression and anxiety, and denied any reoccurrence of panic attacks.  She stated "Today [birth year] my birthday but I've been feeling down and randomly started crying on Sunday".  Sandra [name] reported that she felt that this was partially due to family members that have been ill, which has led her to reflect on her own recent sickness and have a less positive outlook.  She reported that she has scheduled an upcoming appointment with a psychiatrist to discuss medication regimen.  Clinician wished Sandra [name] a happy birthday and inquired about whether she has self-care plans to celebrate today and potentially lift depressed mood.  Sandra [name] reported that she will be  going out to dinner and shopping with her husband later, but the colder weather also tends to make her less motivated to go out and do things.  Clinician offered to teach Sandra [name] another relaxation technique today involving guided meditation, and she was receptive to this, noting that she has a beach trip planned next week, but wishes she could visit sooner.  Clinician invited Sandra [name] to get comfortable, achieve a relaxing breathing pattern, and then guided her through process of imagining a relaxing day out on the beach for 10 minutes, including various pleasant sensory details (I.e. feeling of warm sun, sound of waves crashing, smell of salty air, etc) which could enhance experience.  Interventions were effective, as evidenced by Sandra [name] engaging in activity successfully, and reporting that it did make her feel more relaxed in body and mind, and she was able to fully imagine this scene, in addition to adding other pleasant details such as walking the beach holding hands with her husband.  Clinician encouraged her to add this to self-care routine based upon positive feedback, offered suggestions on how to make it more realistic such as utilizing sound machine, and provided printed handout for script.  Clinician will continue to monitor.                       Plan: Meet again in 1 week.   Diagnosis: Major Depressive Disorder, recurrent, moderate with anxious distress.   [name], [place], LCAS 12/09/19

## 2019-12-09 NOTE — Progress Notes (Signed)
  [facility] Telephone Encounter Continuity Care Appointment  This patient needs an in person visit at her next follow-up to assess her hypertension and diabetes.  Last in person visit was over 1 year ago.  HPI:   This telephone encounter was created for [name] on 12/09/2019 for the following purpose/cc anxiety.    Past Medical History:  Past Medical History:  Diagnosis Date  . Acute pain of left shoulder 06/07/2017  . Asthma    as a child  . Asthma 11/07/2005   Reported Diagnosis in 2012, no formal spirometry   . 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 Johnsie Cancel note 04/2017  . Carpal tunnel syndrome   . Chest pain 07/11/2018  . Chronic HFrEF (heart failure with reduced ejection fraction) ([name]) 02/22/2012  . Diabetes mellitus type 2 in obese ([place]) 11/17/2009   - Synjardy 05-998 BID, Januvia 100mg  Daily,  Basaglar - Failed to tolerate Victoza  . Diabetes mellitus type 2, uncontrolled ([facility]) DX: 1999   Started as gestational diabetes  . ESSENTIAL HYPERTENSION 07/08/2006  . Essential hypertension 07/08/2006  . 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 Gallstones   . HSV 11/07/2005   Qualifier: Diagnosis of  By: Pearline Cables MD, Belenda Cruise    . HSV (herpes simplex virus) anogenital infection   . HYPERLIPIDEMIA 08/20/2007  . Hyperlipidemia 08/20/2007   Atorvastatin 40mg  Daily Last Lipid panel may 2019: Total 138, HDL 31, LDL 89. ASCVD risk of 14.5%   . LBBB (left bundle branch block)   . Menorrhagia   . Migraine    Normal CT head (08/06/2006)  . Migraine without aura 09/02/2006   Fairly Well-controlled  2 per month  Takes Elitriptan   . 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 Perimenopausal menorrhagia  02/20/2012  . Psoriasis 04/29/2013  . Right low back pain 03/02/2014  . Routine adult health maintenance 09/27/2010  . Swelling of abdominal wall 03/02/2014  . Swelling of abdominal wall 03/02/2014  . Symptoms of upper respiratory infection (URI) 04/01/2018  . Viral URI with cough 01/13/2013      ROS:  Review of Systems  Gastrointestinal: Positive for diarrhea and nausea. Negative for abdominal pain and vomiting.  Psychiatric/Behavioral: Positive for depression. Negative for hallucinations and suicidal ideas. The patient is nervous/anxious.   All other systems reviewed and are negative.      Assessment / Plan / Recommendations:   Please see A&P under problem oriented charting for assessment of the patient's acute and chronic medical conditions.   As always, pt is advised that if symptoms worsen or new symptoms arise, they should go to an urgent care facility or to to ER for further evaluation.   Consent and Medical Decision Making:   Patient seen with Dr. Philipp Ovens  This is a telephone encounter between [name] and Andrew Au on 12/09/2019 for anxiety. The visit was conducted with the patient located at home and Andrew Au at [facility]. The patient's identity was confirmed using their DOB and current address. The patient has consented to being evaluated through a telephone encounter and understands the associated risks (an examination cannot be done and the patient may need to come in for an appointment) / benefits (allows the patient to remain at home, decreasing exposure to coronavirus). I personally spent 14 minutes on medical discussion.

## 2019-12-10 ENCOUNTER — Ambulatory Visit (INDEPENDENT_AMBULATORY_CARE_PROVIDER_SITE_OTHER): Payer: BC Managed Care – PPO | Admitting: Student

## 2019-12-10 ENCOUNTER — Other Ambulatory Visit: Payer: Self-pay

## 2019-12-10 DIAGNOSIS — F411 Generalized anxiety disorder: Secondary | ICD-10-CM | POA: Diagnosis not present

## 2019-12-10 MED ORDER — PROMETHAZINE HCL 12.5 MG PO TABS: 12.5000 mg | ORAL_TABLET | Freq: Three times a day (TID) | ORAL | 1 refills | Status: DC | PRN

## 2019-12-10 NOTE — Assessment & Plan Note (Signed)
Episodes of anxiety ongoing since Covid infection in August.  See prior notes for description.  We will start Lexapro at low-dose of 5 mg daily at last visit, she previously had suicidal ideation while taking Zoloft.  He denies further SI at this time and starting Lexapro, but does complain of nausea and diarrhea which have improved somewhat since she started the medication.  She has seen counselor weekly and behavioral strategies have been helpful.  She has required hydroxyzine only 2 times in the past 2 weeks.  Notes her symptoms seem somewhat worse in the morning.  At last visit, we discussed her contacting psychiatrist from paperwork she received upon referral.  She has elected [facility] and will be referred. denies SI, HI, hallucinations.  -Continue Lexapro 5 mg daily.  Follow-up in 2 weeks to titrate up if tolerated. -Continue hydroxyzine as needed -Continue seeing therapist -Referral placed to psychiatry

## 2019-12-10 NOTE — Patient Instructions (Signed)
Thank you for allowing [name] to be a part of your care today, it was a pleasure seeing you. We discussed your anxiety  I have made these changes to your medications: Continue Lexapro 5 mg daily Start Phenergan 12.5 mg every 8 hours as needed for nausea May also take Imodium over-the-counter as needed for diarrhea  We discussed your anxiety, which seems to be improving gradually with therapy.  I am glad that you have only further after seen 2 times.  Please continue to use this as needed.  For the nausea and diarrhea you are having with the Lexapro, this usually gets better over time.  We will continue with the same low dose for now but this may not be effective for your anxiety, and we will plan to increase this at your next visit in 2 weeks if you are tolerating it well.  Have also placed the referral to psychiatry.  Please message to call us if you have any issues.  Please follow up in 2 weeks in person   Thank you, and please call the Internal Medicine Clinic at [phone] if you have any questions.  Best, Dr. Bridgett Larsson

## 2019-12-11 ENCOUNTER — Telehealth: Payer: Self-pay

## 2019-12-11 ENCOUNTER — Ambulatory Visit: Payer: Self-pay | Attending: Internal Medicine

## 2019-12-11 DIAGNOSIS — Z23 Encounter for immunization: Secondary | ICD-10-CM

## 2019-12-11 NOTE — Telephone Encounter (Signed)
Pls contact pt [phone]

## 2019-12-11 NOTE — Progress Notes (Signed)
Internal Medicine Clinic Attending  I was present for the phone call.  I personally confirmed the key portions of the history documented by Dr. Bridgett Larsson and I reviewed pertinent patient test results.  The assessment, diagnosis, and plan were formulated together and I agree with the documentation in the resident's note.

## 2019-12-11 NOTE — Telephone Encounter (Signed)
Pt. Asking if she can receive COVID 19 vaccination while on antidepressants. Instructed her that she can.Verbalizes understanding.

## 2019-12-11 NOTE — Progress Notes (Signed)
   Covid-19 Vaccination Clinic  Name:  Sandra [name]    MRN: [id number] DOB: 03/26/1965  12/11/2019  Sandra [name] was observed post Covid-19 immunization for 15 minutes without incident. She was provided with Vaccine Information Sheet and instruction to access the V-Safe system.   Sandra [name] was instructed to call 911 with any severe reactions post vaccine: Marland Kitchen Difficulty breathing  . Swelling of face and throat  . A fast heartbeat  . A bad rash all over body  . Dizziness and weakness   Immunizations Administered    Name Date Dose VIS Date Route   Pfizer COVID-19 Vaccine 12/11/2019  1:17 PM 0.3 mL 11/11/2019 Intramuscular   Manufacturer: [place]   Lot: [id number]   [place]: [id number]

## 2019-12-11 NOTE — Telephone Encounter (Signed)
Agree. She is more than 90 days out from her COVID infection and her mab infusion

## 2019-12-11 NOTE — Telephone Encounter (Signed)
The patient called to inquire whether she should receive a Covid vaccination.  The patient screened negative for a history of severe allergic reaction to any other vaccine or injectable therapy.  The patient was informed that she is eligible for the vaccine based on published contraindications and precautions.  The patient was encouraged to sign up for a vaccination appointment when she becomes eligible.   The [place] Covid vaccine hotline phone number was provided to the patient.  She states she has already scheduled an appointment for today at 1:00pm for her 1st injection. SChaplin, RN,BSN

## 2019-12-23 ENCOUNTER — Other Ambulatory Visit: Payer: Self-pay

## 2019-12-23 ENCOUNTER — Ambulatory Visit (INDEPENDENT_AMBULATORY_CARE_PROVIDER_SITE_OTHER): Payer: BC Managed Care – PPO | Admitting: Licensed Clinical Social Worker

## 2019-12-23 DIAGNOSIS — F331 Major depressive disorder, recurrent, moderate: Secondary | ICD-10-CM

## 2019-12-23 NOTE — Progress Notes (Addendum)
THERAPIST PROGRESS NOTE   Session Time: 10:00am - 10:45am  Location: Patient: [facility] [place] [facility]  Provider: [facility] [place] [facility]    Participation Level: Active   Behavioral Response: Alert, neatly groomed, casually dressed, depressed affect/mood   Type of Therapy:  Individual Therapy   Treatment Goals addressed: Depression and Anxiety management; Medication management; Psychiatry referral; Employment; Socialization with supports    Interventions: CBT, mindful breathing meditation    Summary: Sandra [name] is a 54 year old married African American female that presented for in-person therapy session today and is diagnosed with Major Depressive Disorder, recurrent, moderate with anxious distress.        Suicidal/Homicidal: None; without intent or plan   Therapist Response: Clinician met with Sandra [name] for in-person therapy appointment and assessed for safety, medication compliance, and sobriety.  Sandra [name] presented for today's session on time and was alert, oriented x5, with no evidence or self-report of SI/HI or A/V H.  Sandra [name] reported that she continues taking medication as prescribed and denied any use of alcohol or illicit substances.  Clinician inquired about Sandra [name]'s emotional ratings today, as well as any significant changes in thoughts, feelings, or behavior since previous check-in.  Sandra [name] reported scores of 4/10 for depression and 3/10 for anxiety, but denied experiencing any panic attacks.  Clinician inquired about Sandra [name]'s successes and struggles over past week.  Sandra [name] reported that she did follow up on psychiatry referral and just needs to fill out paperwork this week ahead of intake.  She also reported that she was able to attend a trip with family to the beach and had a wonderful time, but upon returning home Sunday her mood dropped and she has felt a 'heaviness' most days upon waking, which makes it difficult for her to stay motivated to do things.  Sandra [name] stated "Its like my body is  ready to go, but there's nothing to occupy my mind".  Clinician explored various self-care activities that Sandra [name] could include in schedule while out of work to increase motivation, socialization, and sense of purpose each day.  Sandra [name] reported that she could help out family members and plan activities with them, get more exercise, watch her favorite seasonal TV shows or movies, consider job hunting if she cannot return to prior job schedule in January, consider taking up volunteer opportunities in community, or look into returning to school for new studies.  Sandra [name] reported that she intends to return to work and see how she can cope, but is worried that she might experience greater anxiety related to contracting COVID again.  Clinician offered to teach Sandra [name] a new relaxation technique to day to assist with managing intrusive thoughts, and invited her to participate in mindful breathing meditation exercise.  Sandra [name] was agreeable to this, so clinician guided her through process of getting comfortable, achieving relaxing breathing pattern, and then focusing upon maintaining this for 10 minutes while allowing thoughts or feelings that arose to be acknowledged, but then let go of.  Interventions were effective, as evidenced by Sandra [name] participating in activity successfully and noting that she felt calmer afterward in both body and mind, and expressed interest in adding this to her daily self-care routine.  Clinician provided her with a handout on how to repeat activity in her free time, and will continue to monitor.                       Plan: Meet again in 1 week.   Diagnosis: Major Depressive Disorder, recurrent, moderate with  anxious distress.   Sandra Flood, LCSW, LCAS 12/23/19

## 2019-12-24 ENCOUNTER — Other Ambulatory Visit: Payer: Self-pay

## 2019-12-24 ENCOUNTER — Encounter: Payer: Self-pay | Admitting: Internal Medicine

## 2019-12-24 ENCOUNTER — Ambulatory Visit (INDEPENDENT_AMBULATORY_CARE_PROVIDER_SITE_OTHER): Payer: BC Managed Care – PPO | Admitting: Internal Medicine

## 2019-12-24 VITALS — BP 136/75 | HR 101 | Temp 98.1°F | Ht 61.0 in | Wt 192.3 lb

## 2019-12-24 DIAGNOSIS — E669 Obesity, unspecified: Secondary | ICD-10-CM | POA: Diagnosis not present

## 2019-12-24 DIAGNOSIS — E1169 Type 2 diabetes mellitus with other specified complication: Secondary | ICD-10-CM

## 2019-12-24 DIAGNOSIS — Z1159 Encounter for screening for other viral diseases: Secondary | ICD-10-CM

## 2019-12-24 DIAGNOSIS — F411 Generalized anxiety disorder: Secondary | ICD-10-CM | POA: Diagnosis not present

## 2019-12-24 LAB — POCT GLYCOSYLATED HEMOGLOBIN (HGB A1C): Hemoglobin A1C: 8.1 % — AB (ref 4.0–5.6)

## 2019-12-24 LAB — GLUCOSE, CAPILLARY: Glucose-Capillary: 240 mg/dL — ABNORMAL HIGH (ref 70–99)

## 2019-12-24 MED ORDER — ESCITALOPRAM OXALATE 10 MG PO TABS: 10.0000 mg | ORAL_TABLET | Freq: Every day | ORAL | 0 refills | Status: DC

## 2019-12-24 NOTE — Assessment & Plan Note (Addendum)
Current medications: basaglar 10U daily, januvia 100mg  daily, synjardy 5-1000mg  daily. No recent hypoglycemic event. She has not been checking her blood sugars at home. Has had difficulty with adherence to diet due to severe anxiety/depression. Last A1C 9.9 in Nov 2020. A1C today is 8.1. Foot exam performed at today's appointment Retinal exam is up to date. Spot protein creatinine UTD Plan -I would ideally like her to log her CBGs for a week or two to see where her blood sugars lie but I think we need to focus on controlling her anxiety first as this is having a significant impact on her ability to manage her diabetes. Since her A1C is improved from check, will continue current management and have her follow up with Dr. Bridgett Larsson in a month or two.

## 2019-12-24 NOTE — Progress Notes (Signed)
Office Visit   Patient ID: WILSON SAMPLE, female    DOB: Jan 14, 1966, 54 y.o.   MRN: [id number]  Subjective:  CC: anxiety, diabetes follow up  HPI 54 y.o. presents today for the above.  Anxiety 09/21/19: IOV at which time hydroxazine was tried 10/09/19: persistent anxiety. zoloft started  10/13/19: teleheath visit for suicidal ideation-zoloft discontinued. Referred to psychiatry 11/11/19: lexapro started however pt did not start taking 11/26/19: pt now agreeable to starting lexapro 5mg  12/10/19: some improvement in mood  Today, she notes that she has felt some improvement since starting lexapro however remains very anxious. She notes that she feels this way from waking up in the morning until around 4PM. She is unsure what the reason is for the mood improvement later on in the day. She reports that she still finds it difficult to leave the house. She has poor appetite until the evening.  She has been referred to psychiatry however is still awaiting an appointment.  She has been following with a therapist who she feels like does help and she has developed some skills to help work through some periods of increased anxiety.  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 She does continue imply that she would rather "not go through this anymore" but feels strongly that she would not harm herself because of her faith and family.       ACTIVE MEDICATIONS   Current Outpatient Medications on File Prior to Visit  Medication Sig Dispense Refill  . aspirin EC 81 MG tablet Take 81 mg by mouth daily. Swallow whole.    . Aspirin-Acetaminophen-Caffeine (GOODY HEADACHE PO) Take 1 packet by mouth as needed (headache/pain).    Marland Kitchen atorvastatin (LIPITOR) 40 MG tablet Take 1 tablet by mouth once daily 90 tablet 1  . eletriptan (RELPAX) 20 MG tablet Take 1 tablet (20 mg total) by mouth as needed for migraine or headache. May repeat in  2 hours if headache persists or recurs. 10 tablet 0  . fluticasone (FLONASE) 50 MCG/ACT nasal spray Place 1-2 sprays into both nostrils daily as needed for allergies or rhinitis.    . hydrOXYzine (ATARAX/VISTARIL) 10 MG tablet Take 1 tablet (10 mg total) by mouth 3 (three) times daily as needed. 30 tablet 1  . Insulin Glargine (BASAGLAR KWIKPEN) 100 UNIT/ML Inject 0.1 mLs (10 Units total) into the skin daily. 3 mL 2  . Insulin Pen Needle (B-D UF III MINI PEN NEEDLES) 31G X 5 MM MISC 1 Stick by Does not apply route daily. 11.69, insulin requirement 30 each 3  . menthol-cetylpyridinium (CEPACOL) 3 MG lozenge Take 1 lozenge by mouth as needed for sore throat.     . metoprolol succinate (TOPROL-XL) 200 MG 24 hr tablet Take 1 tablet (200 mg total) by mouth daily. Take with or immediately following a meal. 90 tablet 3  . pantoprazole (PROTONIX) 20 MG tablet Take 2 tablets (40 mg total) by mouth daily. 90 tablet 0  . promethazine (PHENERGAN) 12.5 MG tablet Take 1 tablet (12.5 mg total) by mouth every 8 (eight) hours as needed for nausea or vomiting. 20 tablet 1  . sacubitril-valsartan (ENTRESTO) 97-103 MG Take 1 tablet by mouth 2 (two) times daily. 60 tablet 11  . sitaGLIPtin (JANUVIA) 100 MG tablet Take 1 tablet (100 mg total) by mouth daily. 90 tablet 0  . spironolactone (ALDACTONE) 25 MG tablet Take 1 tablet (25 mg total)  by mouth daily. 30 tablet 11  . SYNJARDY XR 05-998 MG TB24 TAKE 1 TABLET   BY MOUTH TWICE DAILY WITH A MEAL 180 tablet 1  . Turmeric (QC TUMERIC COMPLEX PO) Take 1 tablet by mouth daily.      No current facility-administered medications on file prior to visit.    ROS  Review of Systems  Constitutional: Positive for appetite change and fatigue.  Respiratory: Negative for shortness of breath.   Cardiovascular: Negative for chest pain and leg swelling.  Psychiatric/Behavioral: Positive for decreased concentration and sleep disturbance. Negative for self-injury and suicidal ideas.  The patient is nervous/anxious.     Objective:   BP 136/75 (BP Location: Left Arm, Cuff Size: Normal)   Pulse (!) 101   Temp 98.1 F (36.7 C) (Oral)   Ht 5\' 1"  (1.549 m)   Wt 192 lb 4.8 oz (87.2 kg)   SpO2 99% Comment: room air  BMI 36.33 kg/m  Wt Readings from Last 3 Encounters:  12/24/19 192 lb 4.8 oz (87.2 kg)  11/18/19 192 lb 3.2 oz (87.2 kg)  10/28/19 190 lb 6.4 oz (86.4 kg)   BP Readings from Last 3 Encounters:  12/24/19 136/75  11/18/19 140/78  10/28/19 132/82   Physical Exam Constitutional:      Appearance: Normal appearance.  Cardiovascular:     Rate and Rhythm: Regular rhythm. Tachycardia present.  Pulmonary:     Effort: Pulmonary effort is normal.     Breath sounds: Normal breath sounds.  Psychiatric:        Attention and Perception: Attention normal.        Mood and Affect: Mood is anxious and depressed. Affect is tearful.        Thought Content: Thought content does not include suicidal ideation. Thought content does not include suicidal plan.     Health Maintenance:   Health Maintenance  Topic Date Due  . INFLUENZA VACCINE  04/21/2020 (Originally 08/23/2019)  . COVID-19 Vaccine (2 - Pfizer 2-dose series) 01/01/2020  . PAP SMEAR-Modifier  02/27/2020  . MAMMOGRAM  03/05/2020  . HEMOGLOBIN A1C  03/23/2020  . OPHTHALMOLOGY EXAM  04/22/2020  . URINE MICROALBUMIN  10/13/2020  . FOOT EXAM  12/23/2020  . TETANUS/TDAP  03/20/2021  . COLONOSCOPY  06/08/2027  . PNEUMOCOCCAL POLYSACCHARIDE VACCINE AGE 46-64 HIGH RISK  Completed  . Hepatitis C Screening  Completed  . HIV Screening  Completed     Assessment & Plan:   Problem List Items Addressed This Visit      Endocrine   Diabetes mellitus type 2 in obese [facility])    Current medications: basaglar 10U daily, januvia 100mg  daily, synjardy 5-1000mg  daily. No recent hypoglycemic event. She has not been checking her blood sugars at home. Has had difficulty with adherence to diet due to severe  anxiety/depression. Last A1C 9.9 in Nov 2020. A1C today is 8.1. Foot exam performed at today's appointment Retinal exam is up to date. Spot protein creatinine UTD Plan -I would ideally like her to log her CBGs for a week or two to see where her blood sugars lie but I think we need to focus on controlling her anxiety first as this is having a significant impact on her ability to manage her diabetes. Since her A1C is improved from check, will continue current management and have her follow up with Dr. Bridgett Larsson in a month or two.      Relevant Orders   POC Hbg A1C (Completed)     Other  Generalized anxiety disorder (Chronic)    Currently on lexapro 5mg  daily She has received some relief but still remains very anxious. She also has symptoms of depression which are likely attributable to her severe anxiety. She has not yet established with psychiatry but has been following with a therapist who is helping her develop coping skills. Her husband is her primary support system. Denies suicidal plans at today's visit. Our clinic psychologist, Dr. Theodis Shove, was introduced at today's visit.  Plan -increase lexapro to 10mg  daily. Pt is agreeable to close follow up and will call us or go to the ED if she develops suicidal ideations with the increased dose -she will follow up with myself and Dr. Theodis Shove in 2w -crisis plan discussed      Relevant Medications   escitalopram (LEXAPRO) 10 MG tablet   Other Relevant Orders   Ambulatory referral to [place]    Other Visit Diagnoses    Encounter for hepatitis C screening test for low risk patient    -  Primary   Relevant Orders   Hepatitis C antibody (Completed)        Pt discussed with Dr. Marty Heck, MD Internal Medicine Resident PGY-2 [facility] Pager: [phone] 12/25/2019 9:05 AM

## 2019-12-25 ENCOUNTER — Encounter: Payer: Self-pay | Admitting: Internal Medicine

## 2019-12-25 ENCOUNTER — Other Ambulatory Visit: Payer: Self-pay | Admitting: Internal Medicine

## 2019-12-25 DIAGNOSIS — K219 Gastro-esophageal reflux disease without esophagitis: Secondary | ICD-10-CM

## 2019-12-25 LAB — HEPATITIS C ANTIBODY: Hep C Virus Ab: <0.1 s/co ratio (ref 0.0–0.9)

## 2019-12-25 NOTE — Assessment & Plan Note (Signed)
Currently on lexapro 5mg  daily She has received some relief but still remains very anxious. She also has symptoms of depression which are likely attributable to her severe anxiety. She has not yet established with psychiatry but has been following with a therapist who is helping her develop coping skills. Her husband is her primary support system. Denies suicidal plans at today's visit. Our clinic psychologist, Dr. Theodis Shove, was introduced at today's visit.  Plan -increase lexapro to 10mg  daily. Pt is agreeable to close follow up and will call us or go to the ED if she develops suicidal ideations with the increased dose -she will follow up with myself and Dr. Theodis Shove in 2w -crisis plan discussed

## 2019-12-25 NOTE — Progress Notes (Signed)
Internal Medicine Clinic Attending  Case discussed with Dr. Christian  At the time of the visit.  We reviewed the resident's history and exam and pertinent patient test results.  I agree with the assessment, diagnosis, and plan of care documented in the resident's note.  

## 2019-12-30 ENCOUNTER — Ambulatory Visit (INDEPENDENT_AMBULATORY_CARE_PROVIDER_SITE_OTHER): Payer: BC Managed Care – PPO | Admitting: Licensed Clinical Social Worker

## 2019-12-30 ENCOUNTER — Other Ambulatory Visit: Payer: Self-pay

## 2019-12-30 DIAGNOSIS — F331 Major depressive disorder, recurrent, moderate: Secondary | ICD-10-CM

## 2019-12-30 NOTE — Progress Notes (Addendum)
THERAPIST PROGRESS NOTE   Session Time: 10:00am - 10:30am  Location: Patient: [facility] [place] [facility]  Provider: [facility] [place] [facility]    Participation Level: Active   Behavioral Response: Alert, neatly groomed, casually dressed, depressed affect/mood   Type of Therapy:  Individual Therapy   Treatment Goals addressed: Depression and Anxiety management; Medication management; Employment   Interventions: CBT, mental grounding techniques   Summary: Sandra [name] is a 54 year old married African American female that presented for in person therapy session today and is diagnosed with Major Depressive Disorder, recurrent, moderate with anxious distress.        Suicidal/Homicidal: None; without intent or plan   Therapist Response: Clinician met with Sandra [name] for in-person therapy session and assessed for safety, medication compliance, and sobriety.  Sandra [name] presented for today's appointment on time and was alert, oriented x5, with no evidence or self-report of SI/HI or A/V H.  Sandra [name] reported ongoing compliance with medication and denied any use of alcohol or illicit substances.  Clinician inquired about October's current emotional ratings, as well as any significant changes in thoughts, feelings, or behavior since last check-in.  Sandra [name] reported scores of 3/10 for depression. 0/10 for anxiety, and 2/10 for anger/irritability, but denied any reoccurrence of panic attacks.  Clinician inquired about Sandra [name]'s successes and struggles over last week.  Sandra [name] reported that her doctor increased her Lexapro to 55m, and feels that this has been beneficial.  She reported that she has had several members of her family experience unexpected medical issues, but has been practicing relaxation techniques and engaging in healthy self-care in order to keep her mind preoccupied and calm.  Sandra [name] that she is still concerned about beginning work again at the end of this month, stating "I don't know how much stress I'm going  to be under when I come back".  Clinician offered to discuss more grounding techniques with Sandra [name] which could be utilized to temporarily distract her from troubling thoughts and feelings should they arise.  CCourtenywas agreeable to this, so clinician covered category of mental grounding techniques, with examples such as describing one's environment in detail, playing a categories game, imagining something calming, reading something out loud/slowly, finding something humorous to laugh about, and counting to 10 while breathing slowly.  Interventions were effective, as evidenced by Sandra Axtrying out all of these examples today in session, and reporting that several seemed like they would be beneficial such as visualizing herself in church or at the beach with husband, reading or reciting specific spiritual verses, or counting to 10, so she plans to practice them until next session and report back on effectiveness.  Clinician provided Sandra [name] of these techniques and will continue to monitor.                       Plan: Meet again in 1 week.   Diagnosis: Major Depressive Disorder, recurrent, moderate with anxious distress.   CShade Flood LCSW, LCAS 12/30/19

## 2020-01-01 ENCOUNTER — Ambulatory Visit: Payer: Self-pay

## 2020-01-04 ENCOUNTER — Ambulatory Visit: Payer: BC Managed Care – PPO | Attending: Internal Medicine

## 2020-01-04 DIAGNOSIS — Z23 Encounter for immunization: Secondary | ICD-10-CM

## 2020-01-04 NOTE — Progress Notes (Signed)
.     Covid-19 Vaccination Clinic  Name:  Sandra [name]    MRN: [id number] DOB: Jan 12, 1966  01/04/2020  [name] was observed post Covid-19 immunization for 15 minutes without incident. She was provided with Vaccine Information Sheet and instruction to access the V-Safe system.   [name] was instructed to call 911 with any severe reactions post vaccine: Marland Kitchen Difficulty breathing  . Swelling of face and throat  . A fast heartbeat  . A bad rash all over body  . Dizziness and weakness   Immunizations Administered    Name Date Dose VIS Date Route   Pfizer COVID-19 Vaccine 01/04/2020  1:29 PM 0.3 mL 11/11/2019 Intramuscular   Manufacturer: [place]   Lot: [id number]   NDC: [address]-3

## 2020-01-05 ENCOUNTER — Other Ambulatory Visit: Payer: Self-pay | Admitting: Student

## 2020-01-05 DIAGNOSIS — E669 Obesity, unspecified: Secondary | ICD-10-CM

## 2020-01-06 ENCOUNTER — Ambulatory Visit (INDEPENDENT_AMBULATORY_CARE_PROVIDER_SITE_OTHER): Payer: BC Managed Care – PPO | Admitting: Licensed Clinical Social Worker

## 2020-01-06 ENCOUNTER — Other Ambulatory Visit: Payer: Self-pay

## 2020-01-06 DIAGNOSIS — F331 Major depressive disorder, recurrent, moderate: Secondary | ICD-10-CM

## 2020-01-06 NOTE — Progress Notes (Signed)
Virtual Visit via Video Note   I connected with Sandra [name] on 01/06/20 at 10:00am by video enabled telemedicine application and verified that I am speaking with the correct person using two identifiers.   I discussed the limitations, risks, security and privacy concerns of performing an evaluation and management service by video and the availability of in person appointments. I also discussed with the patient that there may be a patient responsible charge related to this service. The patient expressed understanding and agreed to proceed.   I discussed the assessment and treatment plan with the patient. The patient was provided an opportunity to ask questions and all were answered. The patient agreed with the plan and demonstrated an understanding of the instructions.   The patient was advised to call back or seek an in-person evaluation if the symptoms worsen or if the condition fails to improve as anticipated.   I provided 30 minutes of non-face-to-face time during this encounter.     [name], LCSW, LCAS __________________________________________________________ THERAPIST PROGRESS NOTE   Session Time: 10:00am - 10:30am  Location: Patient: Patient home Provider: [facility] [place] [facility]    Participation Level: Active   Behavioral Response: Alert, neatly groomed, casually dressed, anxious affect/mood   Type of Therapy:  Individual Therapy   Treatment Goals addressed: Depression and Anxiety management; Psychiatry followup; Medical appointments; Medication management   Interventions: CBT, physical grounding techniques   Summary: Sandra [name] is a 54 year old married African American female that presented for virtual therapy session today and is diagnosed with Major Depressive Disorder, recurrent, moderate with anxious distress.        Suicidal/Homicidal: None; without intent or plan   Therapist Response: Clinician met with Sandra [name] for virtual therapy appointment and assessed for  safety, medication compliance, and sobriety.  Sandra [name] presented for today's session on time and was alert, oriented x5, with no evidence or self-report of SI/HI or A/V H.  Sandra [name] reported that she continues taking medication as prescribed and denied any use of alcohol or illicit substances.  Clinician inquired about Sandra [name]'s emotional ratings today, as well as any significant changes in thoughts, feelings, or behavior since previous check-in.  Sandra [name] reported scores of 2/10 for depression,4/10 for anxiety, and 0/10 for anger/irritability, but denied experiencing any panic attacks.  Clinician inquired about Sandra [name]'s successes and struggles over this past week.  Sandra [name] reported that she visited her niece in [place] over the weekend and had a good time with family playing boardgames, bowling, and eating out.  She reported that she has an appointment with her PCP and new psychiatrist tomorrow.  Sandra [name] reported that her struggles include experiencing body aches following 2nd COVID vaccine administration yesterday, but she has outreached doctor for guidance.  She reported that due to not feeling well, she wished to have a half session, so clinician agreed to this, and proposed continuation of grounding techniques discussion to help her cope with panic attacks.  Clinician covered category of physical grounding approaches today, including examples such as running cool/warm water over one's hands, carrying a grounding object in one's pocket to touch, stretching, eating something savory, focusing on breathing rhythm, and more.  Interventions were effective, as evidenced by Sandra [name] trying several of these out during session and reporting that several seemed appealing, including running a warm bath when beginning to feel stressed, keeping a cross in her pocket as a grounding object that reminds her of link to higher power, doing a yoga routine to stretch, and focusing on deep breathing while  reciting scripture.  Sandra [name]  stated "I'm willing to try anything to keep my anxiety under control, so I'll start practicing these too". Clinician will continue to monitor.                       Plan: Meet again in 1 week.   Diagnosis: Major Depressive Disorder, recurrent, moderate with anxious distress.   [name], LCSW, LCAS 01/06/20

## 2020-01-07 ENCOUNTER — Ambulatory Visit: Payer: BC Managed Care – PPO | Admitting: Behavioral Health

## 2020-01-07 ENCOUNTER — Encounter: Payer: Self-pay | Admitting: Internal Medicine

## 2020-01-07 ENCOUNTER — Other Ambulatory Visit: Payer: Self-pay

## 2020-01-07 ENCOUNTER — Ambulatory Visit (INDEPENDENT_AMBULATORY_CARE_PROVIDER_SITE_OTHER): Payer: BC Managed Care – PPO | Admitting: Internal Medicine

## 2020-01-07 DIAGNOSIS — F411 Generalized anxiety disorder: Secondary | ICD-10-CM

## 2020-01-07 NOTE — Patient Instructions (Signed)
[facility] [place],  Location:  Near the [place] center. Take elevator to SECOND floor: [address], [address], [place] [address]

## 2020-01-07 NOTE — BH Specialist Note (Signed)
Integrated Behavioral Health Initial In-Person Visit  MRN: [id number] Name: Sandra [name]  Number of [place] Clinician visits:: 1/6 Session Start time: 2:00pm  Session End time: 2:50pm Total time: 50  minutes  Types of Service: Intake/Assessment process  Interpretor:No. Interpretor Name and Language: n/a   Warm Hand Off Completed.       Subjective: Sandra [name] is a 54 y.o. female accompanied by self Patient was referred by Dr. Evette Doffing, MD for anx/dep. Patient reports the following symptoms/concerns: Pt has elevated anx daily. She is taking Lexapro 10mg  which was just titrated 2 wks ago from 5mg . She has not noticed a difference yet. She is also using Hydroxyzine-she took this in the am due to the tension in her ba Duration of problem: Since Aug of 2021 when Pt was ill wCOVID-19 (Pt suffered w/extreme breathing issues as a result & her anxiety has been heightened since); Severity of problem: moderate  Objective: Mood: Anxious and Affect: Appropriate Risk of harm to self or others: No plan to harm self or others  Life Context: Family and Social: Pt lives w/her [employer] & young adult Son. Pt became sick w/COVID first, then her Mother, then her Husb. She had the worse outcome. She came to realize how bad her anxiety was & has been treating it since. Currently, she is seeing Chryl Heck @ [facility] on [address]. She hopes to alternate sessions btwn this Clinician & Mr. Redmond Baseman. 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 Redmond Baseman. She is, "tired of dealing w/anxiety." Her Husb is understanding. He works Third Archivist at [employer]. Her Son is 52yo working for [employer]. He has his own Apt. Mornings are the most difficult; she feels more herself in the afternoon into evening. It is difficult to eat much as her gut has been bothering her. Pt sts, "I was doing good 2 wks ago. Now  she realizes she needs to RTW for income. Life Changes: Pt has exp'd elevated anxiety levels since Aug w/onset of illness.  Patient and/or Family's Strengths/Protective Factors: Social connections, Social and Emotional competence and Concrete supports in place (healthy food, safe environments, etc.) Pt has tried to educate Husb about anxiety since he worries. She finds it hard to explain to anyone how anxiety grips her.  Goals Addressed: Patient will: 1. Reduce symptoms of: anxiety, depression and stress 2. Increase knowledge and/or ability of: healthy habits, stress reduction and Pt motivation to begin walking for exercise; outdoors when possible  3. Demonstrate ability to: Increase healthy adjustment to current life circumstances which include health status changes  Progress towards Goals: Estb care today & make a rough plan to discuss further at next visit.   Interventions: Interventions utilized: Intake/Assessment process & MI  Standardized Assessments completed: GAD-7 and PHQ 9-neither were significant today.  Patient and/or Family Response: Pt is willing to perservere through the Lexapro titration to 10mg . She is receptive to new coping skills & was given resources: calm.com & Ruby's TED Talk on mental health.  Patient Centered Plan: Patient is on the following Treatment Plan(s):  Work to reduce stressors around finances & work  Assessment: Patient currently experiencing elevated anxiety & it is making her depressed.   Patient may benefit from skills, coping resources, & tools to assist her ability to adapt to health status changes.  Plan: 1. Follow up with behavioral health clinician on : 2-3 wks 2.  Behavioral recommendations: use calm.com to engage your mind, ask Physician if you can start to exercise today. 3. Referral(s): [facility] (In Clinic) and Psychiatrist-Pt is f/u with  [facility] for Psychi Referral-unsure where this stands per Pt  report. She has the ppw. 4. "From scale of 1-10, how likely are you to follow plan?": [place], LMFT

## 2020-01-08 MED ORDER — ESCITALOPRAM OXALATE 10 MG PO TABS
10.0000 mg | ORAL_TABLET | Freq: Every day | ORAL | 2 refills | Status: DC
Start: 2020-01-08 — End: 2020-05-11

## 2020-01-08 NOTE — Assessment & Plan Note (Addendum)
This encounter was for follow up after increasing her lexapro dose to 10mg  on 12/2. She has noticed some symptom improvement since LOV. She is starting to notice herself have some good days interspersed with the bad.  She denies adverse medication effects including hallucinations, manic behavior or thoughts of harming herself. She was less tearful at today's visit which I take as improvement from 12/2. PHQ-9 score is unchanged from 12/2--her score is 8 GAD-7 score is down 6>5 from 12/2 Plan -continue lexapro 10mg  daily-refills x2 sent -continue to follow with Tommi Rumps with [facility] and/or clinical psychologist Kerin Salen -psychiatry referral was placed on a prior encounter but there was some kind of delay with it due to insurance issues. From what I can see in the chart, it looks like she will be able to go to [facility] in [place]. I have given her the contact information to call to arrange a consultation. -crisis plan discussed -f/u in 4-6w

## 2020-01-08 NOTE — Progress Notes (Signed)
Office Visit   Patient ID: Sandra [name], female    DOB: 28-Feb-1965, 54 y.o.   MRN: [id number]  Subjective:  CC: depression follow up  HPI 54 y.o. presents today for the above. Refer to problem based charting for details, assessment and plan.      ACTIVE MEDICATIONS   Outpatient Medications Prior to Visit  Medication Sig Dispense Refill  . aspirin EC 81 MG tablet Take 81 mg by mouth daily. Swallow whole.    . Aspirin-Acetaminophen-Caffeine (GOODY HEADACHE PO) Take 1 packet by mouth as needed (headache/pain).    Marland Kitchen atorvastatin (LIPITOR) 40 MG tablet Take 1 tablet by mouth once daily 90 tablet 1  . eletriptan (RELPAX) 20 MG tablet Take 1 tablet (20 mg total) by mouth as needed for migraine or headache. May repeat in 2 hours if headache persists or recurs. 10 tablet 0  . escitalopram (LEXAPRO) 10 MG tablet Take 1 tablet (10 mg total) by mouth daily. 30 tablet 0  . fluticasone (FLONASE) 50 MCG/ACT nasal spray Place 1-2 sprays into both nostrils daily as needed for allergies or rhinitis.    . hydrOXYzine (ATARAX/VISTARIL) 10 MG tablet Take 1 tablet (10 mg total) by mouth 3 (three) times daily as needed. 30 tablet 1  . Insulin Glargine (BASAGLAR KWIKPEN) 100 UNIT/ML Inject 0.1 mLs (10 Units total) into the skin daily. 3 mL 2  . Insulin Pen Needle (B-D UF III MINI PEN NEEDLES) 31G X 5 MM MISC 1 Stick by Does not apply route daily. 11.69, insulin requirement 30 each 3  . JANUVIA 100 MG tablet Take 1 tablet by mouth once daily 90 tablet 0  . menthol-cetylpyridinium (CEPACOL) 3 MG lozenge Take 1 lozenge by mouth as needed for sore throat.     . metoprolol succinate (TOPROL-XL) 200 MG 24 hr tablet Take 1 tablet (200 mg total) by mouth daily. Take with or immediately following a meal. 90 tablet 3  . pantoprazole (PROTONIX) 20 MG tablet Take 2 tablets by mouth once daily 90 tablet 0  . promethazine (PHENERGAN) 12.5 MG tablet Take 1 tablet (12.5 mg total) by mouth every 8 (eight) hours as needed  for nausea or vomiting. 20 tablet 1  . sacubitril-valsartan (ENTRESTO) 97-103 MG Take 1 tablet by mouth 2 (two) times daily. 60 tablet 11  . spironolactone (ALDACTONE) 25 MG tablet Take 1 tablet (25 mg total) by mouth daily. 30 tablet 11  . SYNJARDY XR 05-998 MG TB24 TAKE 1 TABLET   BY MOUTH TWICE DAILY WITH A MEAL 180 tablet 1  . Turmeric (QC TUMERIC COMPLEX PO) Take 1 tablet by mouth daily.      No facility-administered medications prior to visit.     ROS  Review of Systems  Constitutional: Negative for activity change and appetite change.  Gastrointestinal: Negative for abdominal pain, diarrhea, nausea and vomiting.  Neurological: Negative for dizziness and light-headedness.  Psychiatric/Behavioral: Positive for decreased concentration. Negative for agitation, dysphoric mood, hallucinations, self-injury, sleep disturbance and suicidal ideas. The patient is nervous/anxious.     Objective:   BP (!) 147/77 (BP Location: Left Arm, Patient Position: Sitting, Cuff Size: Large)   Pulse 77   Temp 98.3 F (36.8 C) (Oral)   Ht 5\' 1"  (1.549 m)   Wt 193 lb 1.6 oz (87.6 kg)   SpO2 99% Comment: room air  BMI 36.49 kg/m  Wt Readings from Last 3 Encounters:  01/07/20 193 lb 1.6 oz (87.6 kg)  12/24/19 192 lb 4.8 oz (87.2  kg)  11/18/19 192 lb 3.2 oz (87.2 kg)   BP Readings from Last 3 Encounters:  01/07/20 (!) 147/77  12/24/19 136/75  11/18/19 140/78   Physical Exam Constitutional:      Appearance: Normal appearance.  Psychiatric:        Attention and Perception: Attention normal. She does not perceive auditory hallucinations.        Mood and Affect: Mood is anxious and depressed. Affect is not tearful.        Speech: Speech normal.        Behavior: Behavior normal. Behavior is cooperative.        Thought Content: Thought content is not paranoid or delusional. Thought content does not include suicidal ideation. Thought content does not include suicidal plan.     Health  Maintenance:   Health Maintenance  Topic Date Due  . PAP SMEAR-Modifier  02/27/2020  . INFLUENZA VACCINE  04/21/2020 (Originally 08/23/2019)  . MAMMOGRAM  03/05/2020  . HEMOGLOBIN A1C  03/23/2020  . OPHTHALMOLOGY EXAM  04/22/2020  . COVID-19 Vaccine (3 - Booster for Pfizer series) 07/04/2020  . URINE MICROALBUMIN  10/13/2020  . FOOT EXAM  12/23/2020  . TETANUS/TDAP  03/20/2021  . COLONOSCOPY  06/08/2027  . PNEUMOCOCCAL POLYSACCHARIDE VACCINE AGE 71-64 HIGH RISK  Completed  . Hepatitis C Screening  Completed  . HIV Screening  Completed     Assessment & Plan:   Problem List Items Addressed This Visit      Other   Generalized anxiety disorder (Chronic)    This encounter was for follow up after increasing her lexapro dose to 10mg  on 12/2. She has noticed some symptom improvement since LOV. She is starting to notice herself have some good days interspersed with the bad.  She denies adverse medication effects including hallucinations, manic behavior or thoughts of harming herself. She was less tearful at today's visit which I take as improvement from 12/2. PHQ-9 score is unchanged from 12/2--her score is 8 GAD-7 score is down 6>5 from 12/2 Plan -continue lexapro 10mg  daily -continue to follow with Tommi Rumps with [facility] and/or clinical psychologist Kerin Salen -psychiatry referral was placed on a prior encounter but there was some kind of delay with it due to insurance issues. From what I can see in the chart, it looks like she will be able to go to [facility] here in [place]. I have given her the contact information to call to arrange a consultation.           Pt discussed with Dr. Elwanda Brooklyn, MD Internal Medicine Resident PGY-2 [facility] Pager: [phone] 01/08/2020 1:33 PM

## 2020-01-11 NOTE — Progress Notes (Signed)
Internal Medicine Clinic Attending  Case discussed with Dr. Christian  At the time of the visit.  We reviewed the resident's history and exam and pertinent patient test results.  I agree with the assessment, diagnosis, and plan of care documented in the resident's note.  

## 2020-01-13 ENCOUNTER — Other Ambulatory Visit: Payer: Self-pay

## 2020-01-13 ENCOUNTER — Ambulatory Visit (INDEPENDENT_AMBULATORY_CARE_PROVIDER_SITE_OTHER): Payer: BC Managed Care – PPO | Admitting: Licensed Clinical Social Worker

## 2020-01-13 DIAGNOSIS — F331 Major depressive disorder, recurrent, moderate: Secondary | ICD-10-CM
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
## 2020-01-18 ENCOUNTER — Ambulatory Visit: Payer: BC Managed Care – PPO | Admitting: Pulmonary Disease

## 2020-01-18 ENCOUNTER — Other Ambulatory Visit: Payer: Self-pay

## 2020-01-18 ENCOUNTER — Encounter: Payer: Self-pay | Admitting: Pulmonary Disease

## 2020-01-18 VITALS — BP 116/64 | HR 95 | Ht 61.0 in | Wt 193.2 lb

## 2020-01-18 DIAGNOSIS — Z8616 Personal history of COVID-19: Secondary | ICD-10-CM | POA: Diagnosis not present

## 2020-01-18 DIAGNOSIS — J4520 Mild intermittent asthma, uncomplicated: Secondary | ICD-10-CM | POA: Diagnosis not present

## 2020-01-18 DIAGNOSIS — Z6836 Body mass index (BMI) 36.0-36.9, adult: Secondary | ICD-10-CM | POA: Diagnosis not present

## 2020-01-18 MED ORDER — BREO ELLIPTA 200-25 MCG/INH IN AEPB: 1.0000 | INHALATION_SPRAY | Freq: Every day | RESPIRATORY_TRACT | 0 refills | Status: DC

## 2020-01-18 MED ORDER — BREO ELLIPTA 200-25 MCG/INH IN AEPB: 1.0000 | INHALATION_SPRAY | Freq: Every day | RESPIRATORY_TRACT | 5 refills | Status: DC

## 2020-01-18 MED ORDER — ALBUTEROL SULFATE HFA 108 (90 BASE) MCG/ACT IN AERS: 2.0000 | INHALATION_SPRAY | Freq: Four times a day (QID) | RESPIRATORY_TRACT | 6 refills | Status: DC | PRN

## 2020-01-18 NOTE — Patient Instructions (Signed)
Thank you for visiting Dr. Tonia Brooms at [facility]. Today we recommend the following:  New script for albuterol inhaler Breo one puff once daily Samples and a new script.   Return in about 3 months (around 04/17/2020) for with APP or Dr. Tonia Brooms.    Please do your part to reduce the spread of COVID-19.

## 2020-01-18 NOTE — Progress Notes (Signed)
Synopsis: Referred in December 2021 for covid, asthma by Wendall Stade, MD  Subjective:   PATIENT ID: Sandra [name] GENDER: female DOB: Aug 26, 1965, MRN: [id number]  Chief Complaint  Patient presents with  . Consult    Pt is being referred by Dr. Eden Emms due to being diagnosed with pneumonia after having covid. Pt states she sometimes will have problems with SOB.    PMH of asthma, dilated cardiomyopathy, (Dr. Eden Emms), on HF regimen. Diagnosed with Covid in august 11th. She was not hospitalized. She was on oxygen at home and breathing treatments. She was treated with steroids and dvt ppx. She feels much better now. And she works as a Warehouse manager. She uses albuterol on occasion. At least 2-3 times with short flare ups during the week. She has a nebulizer at home that she uses as well.  At this point her breathing is much better in comparison to where she was before.   Past Medical History:  Diagnosis Date  . Acute pain of left shoulder 06/07/2017  . Asthma    as a child  . Asthma 11/07/2005   Reported Diagnosis in 2012, no formal spirometry   . Cardiomyopathy (HCC)    likely nonischemic DCM per Dr. Eden Emms note 04/2017  . Carpal tunnel syndrome   . Chest pain 07/11/2018  . Chronic HFrEF (heart failure with reduced ejection fraction) (HCC) 02/22/2012  . Diabetes mellitus type 2 in obese (HCC) 11/17/2009   - Synjardy 05-998 BID, Januvia 100mg  Daily,  Basaglar - Failed to tolerate Victoza  . Diabetes mellitus type 2, uncontrolled (HCC) DX: 1999   Started as gestational diabetes  . ESSENTIAL HYPERTENSION 07/08/2006  . Essential hypertension 07/08/2006  . 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 Gallstones   . HSV 11/07/2005   Qualifier: Diagnosis of  By: 11/09/2005  MD, Wallace Cullens    . HSV (herpes simplex virus) anogenital infection   . HYPERLIPIDEMIA 08/20/2007  . Hyperlipidemia 08/20/2007   Atorvastatin 40mg  Daily Last Lipid panel may 2019: Total 138, HDL 31, LDL 89. ASCVD risk of 14.5%   . LBBB (left bundle branch block)   . Menorrhagia   . Migraine    Normal CT head (08/06/2006)  . Migraine without aura 09/02/2006   Fairly Well-controlled  2 per month  Takes Elitriptan   . 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 Perimenopausal menorrhagia 02/20/2012  . Psoriasis 04/29/2013  . Right low back pain 03/02/2014  . Routine adult health maintenance 09/27/2010  . Swelling of abdominal wall 03/02/2014  . Swelling of abdominal wall 03/02/2014  . Symptoms of upper respiratory infection (URI) 04/01/2018  . Viral URI with cough 01/13/2013     Family History  Problem Relation Age of Onset  . Hypertension Father   . Diabetes Father      Past Surgical History:  Procedure Laterality Date  . CHOLECYSTECTOMY  2009  . DILATATION & CURETTAGE/HYSTEROSCOPY WITH MYOSURE N/A 12/25/2017   Procedure: DILATATION & CURETTAGE/HYSTEROSCOPY   POLYPECTOMY WITH MYOSURE;  Surgeon: 2010, MD;  Location: [facility];  Service: Gynecology;  Laterality: N/A;  . TUBAL LIGATION      Social History   Socioeconomic History  . Marital status: Married    Spouse name: Not on file  . Number of children: Not on file  . Years of education: Not on file  . Highest education level: Not on file  Occupational History  . Occupation: 14/04/2017  Tobacco Use  .  Smoking status: Never Smoker  . Smokeless tobacco: Never Used  Vaping Use  . Vaping Use: Never used  Substance and Sexual Activity  . Alcohol use: Yes    Alcohol/week: 0.0 standard drinks    Comment: occ  . Drug use: No  . Sexual activity: Yes    Birth control/protection: Surgical  Other Topics Concern  . Not on file  Social History Narrative   Studying at [employer] in early childhood education.    Married.   Social Determinants of Health   Financial Resource Strain: Not on file  Food Insecurity: Not on file  Transportation Needs: Not on file  Physical Activity: Not on file  Stress: Not on file  Social Connections: Not on file  Intimate Partner Violence: Not on file     Allergies  Allergen Reactions  . Drug Class [Trazodone And Nefazodone]     Developed suicidal ideation within 3 days of starting  . Sertraline Hcl     Developed suicidal ideation within 3d of starting     Outpatient Medications Prior to Visit  Medication Sig Dispense Refill  . aspirin EC 81 MG tablet Take 81 mg by mouth daily. Swallow whole.    . Aspirin-Acetaminophen-Caffeine (GOODY HEADACHE PO) Take 1 packet by mouth as needed (headache/pain).    Marland Kitchen atorvastatin (LIPITOR) 40 MG tablet Take 1 tablet by mouth once daily 90 tablet 1  . eletriptan (RELPAX) 20 MG tablet Take 1 tablet (20 mg total) by mouth as needed for migraine or headache. May repeat in 2 hours if headache persists or recurs. 10 tablet 0  . escitalopram (LEXAPRO) 10 MG tablet Take 1 tablet (10 mg total) by mouth daily. 30 tablet 2  . fluticasone (FLONASE) 50 MCG/ACT nasal spray Place 1-2 sprays into both nostrils daily as needed for allergies or rhinitis.    . hydrOXYzine (ATARAX/VISTARIL) 10 MG tablet Take 1 tablet (10 mg total) by mouth 3 (three) times daily as needed. 30 tablet 1  . Insulin Glargine (BASAGLAR KWIKPEN) 100 UNIT/ML Inject 0.1 mLs (10 Units total) into the skin daily. 3 mL 2  . Insulin Pen Needle (B-D UF III MINI PEN NEEDLES) 31G X 5 MM MISC 1 Stick by Does not apply route daily. 11.69, insulin requirement 30 each 3  . JANUVIA 100 MG tablet Take 1 tablet by mouth once daily 90 tablet 0  . menthol-cetylpyridinium (CEPACOL) 3 MG lozenge Take 1 lozenge by mouth as needed for sore throat.     . metoprolol succinate (TOPROL-XL) 200 MG 24 hr tablet Take 1 tablet (200 mg total) by mouth daily. Take with or immediately following a meal.  90 tablet 3  . pantoprazole (PROTONIX) 20 MG tablet Take 2 tablets by mouth once daily 90 tablet 0  . promethazine (PHENERGAN) 12.5 MG tablet Take 1 tablet (12.5 mg total) by mouth every 8 (eight) hours as needed for nausea or vomiting. 20 tablet 1  . sacubitril-valsartan (ENTRESTO) 97-103 MG Take 1 tablet by mouth 2 (two) times daily. 60 tablet 11  . spironolactone (ALDACTONE) 25 MG tablet Take 1 tablet (25 mg total) by mouth daily. 30 tablet 11  . SYNJARDY XR 05-998 MG TB24 TAKE 1 TABLET   BY MOUTH TWICE DAILY WITH A MEAL 180 tablet 1  . Turmeric (QC TUMERIC COMPLEX PO) Take 1 tablet by mouth daily.      No facility-administered medications prior to visit.    Review of Systems  Constitutional: Negative for chills, fever, malaise/fatigue and weight loss.  HENT: Negative for hearing loss, sore throat and tinnitus.   Eyes: Negative for blurred vision and double vision.  Respiratory: Positive for cough and shortness of breath. Negative for hemoptysis, sputum production, wheezing and stridor.   Cardiovascular: Negative for chest pain, palpitations, orthopnea, leg swelling and PND.  Gastrointestinal: Negative for abdominal pain, constipation, diarrhea, heartburn, nausea and vomiting.  Genitourinary: Negative for dysuria, hematuria and urgency.  Musculoskeletal: Negative for joint pain and myalgias.  Skin: Negative for itching and rash.  Neurological: Negative for dizziness, tingling, weakness and headaches.  Endo/Heme/Allergies: Negative for environmental allergies. Does not bruise/bleed easily.  Psychiatric/Behavioral: Negative for depression. The patient is not nervous/anxious and does not have insomnia.   All other systems reviewed and are negative.    Objective:  Physical Exam Vitals reviewed.  Constitutional:      General: She is not in acute distress.    Appearance: She is well-developed and well-nourished. She is obese.  HENT:     Head: Normocephalic and atraumatic.      Mouth/Throat:     Mouth: Oropharynx is clear and moist.  Eyes:     General: No scleral icterus.    Conjunctiva/sclera: Conjunctivae normal.     Pupils: Pupils are equal, round, and reactive to light.  Neck:     Vascular: No JVD.     Trachea: No tracheal deviation.  Cardiovascular:     Rate and Rhythm: Normal rate and regular rhythm.     Pulses: Intact distal pulses.     Heart sounds: Normal heart sounds. No murmur heard.   Pulmonary:     Effort: Pulmonary effort is normal. No tachypnea, accessory muscle usage or respiratory distress.     Breath sounds: Normal breath sounds. No stridor. No wheezing, rhonchi or rales.  Abdominal:     General: Bowel sounds are normal. There is no distension.     Palpations: Abdomen is soft.     Tenderness: There is no abdominal tenderness.  Musculoskeletal:        General: No tenderness or edema.     Cervical back: Neck supple.  Lymphadenopathy:     Cervical: No cervical adenopathy.  Skin:    General: Skin is warm and dry.     Capillary Refill: Capillary refill takes less than 2 seconds.     Findings: No rash.  Neurological:     Mental Status: She is alert and oriented to person, place, and time.  Psychiatric:        Mood and Affect: Mood and affect normal.        Behavior: Behavior normal.      Vitals:   01/18/20 1412  BP: 116/64  Pulse: 95  SpO2: 98%  Weight: 193 lb 3.2 oz (87.6 kg)  Height: 5\' 1"  (1.549 m)   98% on RA BMI Readings from Last 3 Encounters:  01/18/20 36.50 kg/m  01/07/20 36.49 kg/m  12/24/19 36.33 kg/m   Wt Readings from Last 3 Encounters:  01/18/20 193 lb 3.2 oz (87.6 kg)  01/07/20 193 lb 1.6 oz (87.6 kg)  12/24/19 192 lb 4.8 oz (87.2 kg)     CBC    Component Value Date/Time   WBC 7.4 09/14/2019 1153   RBC 3.80 (L) 09/14/2019 1153   HGB 12.5 09/14/2019 1153   HCT 37.1 09/14/2019 1153   PLT 481 (H) 09/14/2019 1153   MCV 97.6 09/14/2019 1153   MCH 32.9 09/14/2019 1153   MCHC 33.7 09/14/2019 1153    RDW 11.6 09/14/2019 1153  LYMPHSABS 1.5 09/14/2019 1153   MONOABS 0.9 09/14/2019 1153   EOSABS 0.0 09/14/2019 1153   EOSABS 0.2 K/UL 11/02/2005 1656   BASOSABS 0.0 09/14/2019 1153    Chest Imaging: 10/08/2019 CTA chest: Improvement in the peripheral basilar airspace disease consistent with her prior history of COVID-19. The patient's images have been independently reviewed by me.    Pulmonary Functions Testing Results: No flowsheet data found.  FeNO:   Pathology:   Echocardiogram:   Heart Catheterization:     Assessment & Plan:     ICD-10-CM   1. Mild intermittent asthma, unspecified whether complicated  A999333   2. History of COVID-19  Z86.16   3. BMI 36.0-36.9,adult  Z68.36     Discussion:  This is a 54 year old morbidly obese female with asthma since childhood.  She was hospitalized as a kid but she does not remember much beyond that.  She went most of her childhood years without inhaler except the need of as needed albuterol.  She did contract COVID-19 and has slowly been recovering from this.  She feels much better than she did before but she feels like it has stimulated her asthma symptoms.  At this time she has been more frequently using her as needed albuterol.  Plan: Trial of Breo 200. Samples and new prescription today. New prescription for albuterol HFA to be used as needed for shortness of breath and wheezing. Patient can follow-up with [name] in 3 months to see how she is doing with her new inhaler regimen and see if she has better control of her asthma symptoms.  At some point when she is better controlled and fully recovered from PFTs.    Current Outpatient Medications:  .  aspirin EC 81 MG tablet, Take 81 mg by mouth daily. Swallow whole., Disp: , Rfl:  .  Aspirin-Acetaminophen-Caffeine (GOODY HEADACHE PO), Take 1 packet by mouth as needed (headache/pain)., Disp: , Rfl:  .  atorvastatin (LIPITOR) 40 MG tablet, Take 1 tablet by mouth once daily, Disp: 90  tablet, Rfl: 1 .  eletriptan (RELPAX) 20 MG tablet, Take 1 tablet (20 mg total) by mouth as needed for migraine or headache. May repeat in 2 hours if headache persists or recurs., Disp: 10 tablet, Rfl: 0 .  escitalopram (LEXAPRO) 10 MG tablet, Take 1 tablet (10 mg total) by mouth daily., Disp: 30 tablet, Rfl: 2 .  fluticasone (FLONASE) 50 MCG/ACT nasal spray, Place 1-2 sprays into both nostrils daily as needed for allergies or rhinitis., Disp: , Rfl:  .  hydrOXYzine (ATARAX/VISTARIL) 10 MG tablet, Take 1 tablet (10 mg total) by mouth 3 (three) times daily as needed., Disp: 30 tablet, Rfl: 1 .  Insulin Glargine (BASAGLAR KWIKPEN) 100 UNIT/ML, Inject 0.1 mLs (10 Units total) into the skin daily., Disp: 3 mL, Rfl: 2 .  Insulin Pen Needle (B-D UF III MINI PEN NEEDLES) 31G X 5 MM MISC, 1 Stick by Does not apply route daily. 11.69, insulin requirement, Disp: 30 each, Rfl: 3 .  JANUVIA 100 MG tablet, Take 1 tablet by mouth once daily, Disp: 90 tablet, Rfl: 0 .  menthol-cetylpyridinium (CEPACOL) 3 MG lozenge, Take 1 lozenge by mouth as needed for sore throat. , Disp: , Rfl:  .  metoprolol succinate (TOPROL-XL) 200 MG 24 hr tablet, Take 1 tablet (200 mg total) by mouth daily. Take with or immediately following a meal., Disp: 90 tablet, Rfl: 3 .  pantoprazole (PROTONIX) 20 MG tablet, Take 2 tablets by mouth once daily, Disp: 90  tablet, Rfl: 0 .  promethazine (PHENERGAN) 12.5 MG tablet, Take 1 tablet (12.5 mg total) by mouth every 8 (eight) hours as needed for nausea or vomiting., Disp: 20 tablet, Rfl: 1 .  sacubitril-valsartan (ENTRESTO) 97-103 MG, Take 1 tablet by mouth 2 (two) times daily., Disp: 60 tablet, Rfl: 11 .  spironolactone (ALDACTONE) 25 MG tablet, Take 1 tablet (25 mg total) by mouth daily., Disp: 30 tablet, Rfl: 11 .  SYNJARDY XR 05-998 MG TB24, TAKE 1 TABLET   BY MOUTH TWICE DAILY WITH A MEAL, Disp: 180 tablet, Rfl: 1 .  Turmeric (QC TUMERIC COMPLEX PO), Take 1 tablet by mouth daily. , Disp: ,  Rfl:   I spent 45 minutes dedicated to the care of this patient on the date of this encounter to include pre-visit review of records, face-to-face time with the patient discussing conditions above, post visit ordering of testing, clinical documentation with the electronic health record, making appropriate referrals as documented, and communicating necessary findings to members of the patients care team.   Garner Nash, DO [facility] 01/18/2020 2:33 PM

## 2020-01-20 ENCOUNTER — Ambulatory Visit (INDEPENDENT_AMBULATORY_CARE_PROVIDER_SITE_OTHER): Payer: BC Managed Care – PPO | Admitting: Licensed Clinical Social Worker

## 2020-01-20 ENCOUNTER — Other Ambulatory Visit: Payer: Self-pay

## 2020-01-20 DIAGNOSIS — F331 Major depressive disorder, recurrent, moderate: Secondary | ICD-10-CM

## 2020-01-20 NOTE — Progress Notes (Signed)
Virtual Visit via Telephone Note   I connected with Artha Zemba on 01/20/20 at 10:00am by telephone and verified that I am speaking with the correct person using two identifiers.   I discussed the limitations, risks, security and privacy concerns of performing an evaluation and management service by telephone and the availability of in person appointments. I also discussed with the patient that there may be a patient responsible charge related to this service. The patient expressed understanding and agreed to proceed.   I discussed the assessment and treatment plan with the patient. The patient was provided an opportunity to ask questions and all were answered. The patient agreed with the plan and demonstrated an understanding of the instructions.   The patient was advised to call back or seek an in-person evaluation if the symptoms worsen or if the condition fails to improve as anticipated.   I provided 30 minutes of non-face-to-face time during this encounter.     [name], LCSW, LCAS __________________________________________________________ THERAPIST PROGRESS NOTE   Session Time: 10:00am - 10:30am  Location: Patient: Patient home Provider: [facility]    Participation Level: Active   Behavioral Response: Alert, euthymic mood   Type of Therapy:  Individual Therapy   Treatment Goals addressed: Depression and Anxiety management; Medication management; Return to work    Interventions: CBT: challenging automatic negative thoughts   Summary: Kendale Kokoska is a 54 year old married African American female that presented for telephone session today and is diagnosed with Major Depressive Disorder, recurrent, moderate with anxious distress.        Suicidal/Homicidal: None; without intent or plan   Therapist Response: Clinician spoke with Kadajah via telephone today for therapy when she was unable to access virtual meeting without interruptions in connection.  Clinician assessed  for safety, medication compliance, and sobriety.  Khori spoke in a manner that was alert, oriented x5, with no evidence or self-report of SI/HI or A/V H.  Chaia reported that she continues taking medication as prescribed and denied any use of alcohol or illicit substances.  Clinician inquired about Katharyn's emotional ratings today, as well as any significant changes in thoughts, feelings, or behavior since previous check-in.  Janessa reported scores of 0/10 for depression, anxiety, and anger/irritability, and denied experiencing any reoccurrence of panic episodes.  Clinician inquired about Shanesha's successes and struggles over the past week.  Xuri reported that she had a wonderful Christmas with family and plans to have dinner with siblings tonight too.  Natalye reported that she is still planning to return to work on January 3rd, 2022, but worries about having another panic attack when faced with old stressors.  Clinician utilized worry exploration handout with Kurtis in session today, which featured a series of Socratic questions intended to assist her in exploring the most likely outcome(s) for her worry situation rather than focusing entirely upon the worst imaginable outcome(s) in order to challenge any irrational thinking.  Interventions were effective, as evidenced by [name] participating in exercise, and noting that although she was worried about this transition back and various 'what ifs', she acknowledged that she was returning to work with more coping skills than before, has not had any panic episodes in awhile, and feels supported by management in case any issues do arise.  Chade reported greater confidence following completion of activity, and less worried about transition back to work, stating "I'm going to think of the best case scenario and remember my skills to keep myself together".  Clinician will continue  to monitor.                Plan: Meet again in 1 week.   Diagnosis: Major  Depressive Disorder, recurrent, moderate with anxious distress.   Noralee Stain, [place], LCAS 01/20/20

## 2020-01-25 ENCOUNTER — Telehealth: Payer: Self-pay | Admitting: *Deleted

## 2020-01-25 NOTE — Telephone Encounter (Signed)
Pt states she was to return to work tomorrow and now was exposed christmas to COVID by her nephew. She states she was exposed wed before Christmas. She states she has been in self quarantine since and wants to know when she will be able to return to work and can you write her a new letter so she can stay out til next Monday. She is going to be tested tomorrow at [facility].

## 2020-01-26 ENCOUNTER — Other Ambulatory Visit: Payer: BC Managed Care – PPO

## 2020-01-26 DIAGNOSIS — Z20822 Contact with and (suspected) exposure to covid-19: Secondary | ICD-10-CM

## 2020-01-27 ENCOUNTER — Ambulatory Visit (HOSPITAL_COMMUNITY): Payer: BC Managed Care – PPO | Admitting: Licensed Clinical Social Worker

## 2020-01-27 ENCOUNTER — Other Ambulatory Visit: Payer: Self-pay

## 2020-01-27 ENCOUNTER — Telehealth (HOSPITAL_COMMUNITY): Payer: Self-pay | Admitting: Licensed Clinical Social Worker

## 2020-01-27 NOTE — Telephone Encounter (Signed)
Length of quarantine will depend on what her results show. I am happy to write a new letter. Does she want it sent via my chart or to have printed off for someone to pick up for her?

## 2020-01-27 NOTE — Telephone Encounter (Signed)
Sandra [name] had a virtual therapy appointment scheduled today at 3pm.  Clinician outreached Sandra [name] by phone when she did not present for meeting on time.  Sandra [name] answered this phone call and reported that she was not feeling well, so she wished to cancel the appointment.  Clinician expressed understanding, and encouraged Sandra [name] to outreach her doctor as soon as possible for medical guidance, and/or seek assistance with urgent care if condition worsens.  Sandra [name] agreed that she would, noted that she is awaiting return call from PCP, and reported that she intends to keep next therapy appointment in following week. Clinician will continue to monitor.    Sandra Stain, LCSW, LCAS 01/27/20

## 2020-01-28 LAB — SARS-COV-2, NAA 2 DAY TAT

## 2020-01-28 LAB — NOVEL CORONAVIRUS, NAA: SARS-CoV-2, NAA: NOT DETECTED

## 2020-01-28 NOTE — Telephone Encounter (Signed)
Patient called back stating she learned today her Covid test is negative. She is requesting letter allows her to go back to work on 02/03/2020 with no restrictions. She will have her husband p/u the letter when it's ready. Kinnie Feil, BSN, RN-BC

## 2020-01-28 NOTE — Telephone Encounter (Signed)
Thanks, Lauren! I'll print her letter today to give to the front desk.

## 2020-02-02 ENCOUNTER — Ambulatory Visit (HOSPITAL_COMMUNITY): Payer: BC Managed Care – PPO | Admitting: Licensed Clinical Social Worker

## 2020-02-02 ENCOUNTER — Telehealth (HOSPITAL_COMMUNITY): Payer: Self-pay | Admitting: Licensed Clinical Social Worker

## 2020-02-02 ENCOUNTER — Other Ambulatory Visit: Payer: Self-pay

## 2020-02-02 NOTE — Telephone Encounter (Signed)
Sandra [name] had a virtual therapy session scheduled today at [facility] presented for this appointment on time as expected, but was frequently coughing, had a weak, hoarse voice, and was observed laying on her couch under a blanket.  Sandra [name] reported that she has remained in quarantine since previous check-in, as her COVID test was negative, but she continues to deal with symptoms of congestion, fatigue, and a recurrent cough.  Sandra [name] reported that she has been keeping her doctor updated on condition, staying hydrated, and resting frequently.  She asked to cancel today's appointment due to feeling under the weather, and clinician was agreeable to this, encouraging her to continue following doctor's recommendations in the meantime as she recovers. Clinician informed front desk of this cancellation and will continue to monitor.    [name], [place], LCAS 02/02/20

## 2020-02-10 ENCOUNTER — Ambulatory Visit (INDEPENDENT_AMBULATORY_CARE_PROVIDER_SITE_OTHER): Payer: BC Managed Care – PPO | Admitting: Licensed Clinical Social Worker

## 2020-02-10 ENCOUNTER — Other Ambulatory Visit: Payer: Self-pay

## 2020-02-10 DIAGNOSIS — F331 Major depressive disorder, recurrent, moderate: Secondary | ICD-10-CM

## 2020-02-10 NOTE — Progress Notes (Signed)
Virtual Visit via Video Note   I connected with Sandra [name] on 02/10/20 at 3:00pm by video enabled telemedicine application and verified that I am speaking with the correct person using two identifiers.   I discussed the limitations, risks, security and privacy concerns of performing an evaluation and management service by video and the availability of in person appointments. I also discussed with the patient that there may be a patient responsible charge related to this service. The patient expressed understanding and agreed to proceed.   I discussed the assessment and treatment plan with the patient. The patient was provided an opportunity to ask questions and all were answered. The patient agreed with the plan and demonstrated an understanding of the instructions.   The patient was advised to call back or seek an in-person evaluation if the symptoms worsen or if the condition fails to improve as anticipated.   I provided 30 minutes of non-face-to-face time during this encounter.     [name], LCSW, LCAS __________________________________________________________ THERAPIST PROGRESS NOTE   Session Time: 3:00pm - 3:30pm  Location: Patient: Patient home Provider: [facility]    Participation Level: Active   Behavioral Response: Alert, casually dressed, anxious mood/affect   Type of Therapy:  Individual Therapy   Treatment Goals addressed: Depression and Anxiety management; Medication management; Return to work routine   Interventions: CBT, peaceful place guided meditation    Summary: Sandra [name] is a 55 year old married African American female that presented for virtual session today and is diagnosed with Major Depressive Disorder, recurrent, moderate with anxious distress.        Suicidal/Homicidal: None; without intent or plan   Therapist Response: Clinician met with Sandra [name] for virtual therapy appointment today and assessed for safety, medication compliance, and  sobriety.  Sandra [name] presented for appointment on time and was alert, oriented x5, with no evidence or self-report of SI/HI or A/V H.  Sandra [name] reported ongoing compliance with medication and denied any use of alcohol or illicit substances.  Clinician inquired about Sandra [name]'s current emotional ratings today, as well as any significant changes in thoughts, feelings, or behavior since last check-in.  Sandra [name] reported scores of 2/10 for depression, 3/10 for anxiety, 0/10 for anger/irritability, and denied any reoccurrence of panic episodes.  Clinician inquired about Sandra [name]'s successes and struggles over the last week.  Sandra [name] reported that she has begun working at her job again, but has had to adapt to virtual work, which has left her stuck in the household most days, stating "There is this sense of weight on me, and I'm ready to get out of this house already".  Sandra [name] reported that due to working from home and not being able to leave because of snow and icefall over past few days, she has felt more anxious and in need of escape.  Clinician invited Sandra [name] to engage in guided meditation exercise today involving visualizing a peaceful place of her own design, which she was receptive to.  Clinician assisted her in process of getting comfortable, achieving a relaxing breathing pattern, and then adding in details of this mental place she found appealing to the senses, such as colors, sights, sounds, smells, etc in order to enhance experience and alleviate stress.  Clinician ended exercise after 10 minutes and processed outcome of this activity with her.  Intervention was effective, as evidenced by Sandra [name] participating in exercise successfully and reporting that she was able to visualize a bright, comforting place with angels that reminded her of what heaven might  look like, which filled her with peace, and a sense of calm that lasted following conclusion of visualization, stating "I felt connected to god, and there isn't  that weight like I was feeling earlier anymore".  Clinician encouraged her to practice this technique as part of developing relaxation routine and will continue to monitor.                Plan: Meet again in 1 week.   Diagnosis: Major Depressive Disorder, recurrent, moderate with anxious distress.   [name], [place], LCAS 02/10/20

## 2020-02-16 ENCOUNTER — Other Ambulatory Visit: Payer: Self-pay | Admitting: Internal Medicine

## 2020-02-16 DIAGNOSIS — K219 Gastro-esophageal reflux disease without esophagitis: Secondary | ICD-10-CM

## 2020-02-17 ENCOUNTER — Ambulatory Visit (INDEPENDENT_AMBULATORY_CARE_PROVIDER_SITE_OTHER): Payer: BC Managed Care – PPO | Admitting: Licensed Clinical Social Worker

## 2020-02-17 ENCOUNTER — Other Ambulatory Visit: Payer: Self-pay

## 2020-02-17 DIAGNOSIS — F331 Major depressive disorder, recurrent, moderate: Secondary | ICD-10-CM

## 2020-02-17 NOTE — Progress Notes (Signed)
Virtual Visit via Video Note   I connected with Sandra [name] on 02/17/20 at 3:00pm by video enabled telemedicine application and verified that I am speaking with the correct person using two identifiers.   I discussed the limitations, risks, security and privacy concerns of performing an evaluation and management service by video and the availability of in person appointments. I also discussed with the patient that there may be a patient responsible charge related to this service. The patient expressed understanding and agreed to proceed.   I discussed the assessment and treatment plan with the patient. The patient was provided an opportunity to ask questions and all were answered. The patient agreed with the plan and demonstrated an understanding of the instructions.   The patient was advised to call back or seek an in-person evaluation if the symptoms worsen or if the condition fails to improve as anticipated.   I provided 30 minutes of non-face-to-face time during this encounter.     [name], LCSW, LCAS __________________________________________________________ THERAPIST PROGRESS NOTE   Session Time: 3:00pm - 3:30pm  Location: Patient: Patient home Provider: [facility]    Participation Level: Active   Behavioral Response: Alert, casually dressed, euthymic mood/affect   Type of Therapy:  Individual Therapy   Treatment Goals addressed: Depression and Anxiety management; Medication management; Return to work routine   Interventions: CBT, problem solving   Summary: Sandra [name] is a 55 year old married African American female that presented for virtual session today and is diagnosed with Major Depressive Disorder, recurrent, moderate with anxious distress.        Suicidal/Homicidal: None; without intent or plan   Therapist Response: Clinician met with Sandra [name] for virtual therapy session and assessed for safety, medication compliance, and sobriety.  Sandra [name] presented for  today's appointment on time and was alert, oriented x5, with no evidence or self-report of SI/HI or A/V H.  Sandra [name] reported that she continues taking medication as prescribed and denied any use of alcohol or illicit substances.  Clinician inquired about Sandra [name]'s emotional ratings, as well as any significant changes in thoughts, feelings, or behavior since previous check-in.  Sandra [name] reported scores of 0/10 for depression, anxiety, anger/irritability, and denied experiencing any panic attacks.  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  Sandra [name] reported that one challenge was learning that her godson has also begun to experience some mental health concerns recently, and has been asked to speak with him about her own experience.  Sandra [name] reported that she wants to help, but is slightly nervous since she has not done this before, and asked clinician for feedback.  Clinician encouraged Graclyn to be mindful of her own personal boundaries first and foremost, but offered strategies for helping family and/or friends with mental health issues if she chooses to go forward with this request, including choosing an appropriate time/setting with little distraction, avoiding urge to diagnose or problem solve, keeping questions open ended to encourage conversation, discussing wellbeing and common mood-boosting strategies (I.e. ensuring healthy diet, exercise, self-care), active listening and reflection, and offering referrals to trained professionals which can assist further via therapy and/or medication if appropriate.  Intervention was effective, as evidenced by Sandra [name] reporting that this session made her feel less stressed and more prepared to assist her  godson within reason, stating "Its good to  know that I can use my own journey and experience to help someone else.  In my culture people don't talk about this stuff a lot, and I want him to know that its okay to ask for help, and things can get better".  Clinician will continue to monitor.                Plan: Meet again in 1 week.   Diagnosis: Major Depressive Disorder, recurrent, moderate with anxious distress.   [name], [place], LCAS 02/17/20

## 2020-02-23 ENCOUNTER — Encounter: Payer: Self-pay | Admitting: Student

## 2020-02-23 ENCOUNTER — Other Ambulatory Visit: Payer: Self-pay

## 2020-02-23 ENCOUNTER — Ambulatory Visit: Payer: BC Managed Care – PPO | Admitting: Student

## 2020-02-23 VITALS — BP 125/76 | HR 105 | Temp 98.2°F | Ht 60.0 in | Wt 202.1 lb

## 2020-02-23 DIAGNOSIS — E785 Hyperlipidemia, unspecified: Secondary | ICD-10-CM

## 2020-02-23 DIAGNOSIS — D171 Benign lipomatous neoplasm of skin and subcutaneous tissue of trunk: Secondary | ICD-10-CM

## 2020-02-23 DIAGNOSIS — F411 Generalized anxiety disorder: Secondary | ICD-10-CM

## 2020-02-23 DIAGNOSIS — I10 Essential (primary) hypertension: Secondary | ICD-10-CM

## 2020-02-23 DIAGNOSIS — J4520 Mild intermittent asthma, uncomplicated: Secondary | ICD-10-CM

## 2020-02-23 DIAGNOSIS — F32A Depression, unspecified: Secondary | ICD-10-CM | POA: Insufficient documentation

## 2020-02-23 DIAGNOSIS — D179 Benign lipomatous neoplasm, unspecified: Secondary | ICD-10-CM

## 2020-02-23 DIAGNOSIS — E1169 Type 2 diabetes mellitus with other specified complication: Secondary | ICD-10-CM | POA: Diagnosis not present

## 2020-02-23 DIAGNOSIS — I5022 Chronic systolic (congestive) heart failure: Secondary | ICD-10-CM

## 2020-02-23 DIAGNOSIS — K219 Gastro-esophageal reflux disease without esophagitis: Secondary | ICD-10-CM | POA: Diagnosis not present

## 2020-02-23 DIAGNOSIS — F325 Major depressive disorder, single episode, in full remission: Secondary | ICD-10-CM

## 2020-02-23 DIAGNOSIS — E669 Obesity, unspecified: Secondary | ICD-10-CM

## 2020-02-23 HISTORY — DX: Benign lipomatous neoplasm, unspecified: D17.9

## 2020-02-23 MED ORDER — PANTOPRAZOLE SODIUM 40 MG PO TBEC: 40.0000 mg | DELAYED_RELEASE_TABLET | Freq: Every day | ORAL | 1 refills | Status: DC

## 2020-02-23 MED ORDER — ELETRIPTAN HYDROBROMIDE 20 MG PO TABS: 20.0000 mg | ORAL_TABLET | ORAL | 0 refills | Status: DC | PRN

## 2020-02-23 MED ORDER — BASAGLAR KWIKPEN 100 UNIT/ML ~~LOC~~ SOPN
16.0000 [IU] | PEN_INJECTOR | Freq: Every day | SUBCUTANEOUS | 2 refills | Status: DC
Start: 2020-02-23 — End: 2020-11-22

## 2020-02-23 NOTE — Assessment & Plan Note (Signed)
Due to nonischemic dilated cardiomyopathy.  Echo on 10/22/2019 showed LVEF 40% which is stable, 45% the year before. Last saw cardiology on 11/18/19 and plan to continue current meds. No recent SOB or leg swelling.  -continue Toprol 200mg  daily -continue Entresto 97-103mg  BID -continue spironolactone 25mg  daily

## 2020-02-23 NOTE — Assessment & Plan Note (Signed)
Seen by Dr. Valeta Harms on 12/27 for this, following recent Covid infection.  Scheduled for follow-up in 3 months.  She was started on Breo Ellipta 200.  Since she started taking this every day, she has felt better and has not required her albuterol at all.  -Continue Breo Ellipta 200 -Continue albuterol as needed -Follow-up with pulmonology

## 2020-02-23 NOTE — Assessment & Plan Note (Addendum)
Last cholesterol panel in 2020 showed good response with LDL of 65.   -continue lipitor 40mg  -continue ASA 81mg  -recheck lipid panel  Lipid Panel     Component Value Date/Time   CHOL 170 02/23/2020 1500   TRIG 142 02/23/2020 1500   HDL 43 02/23/2020 1500   CHOLHDL 4.0 02/23/2020 1500   CHOLHDL 3.9 07/11/2018 0430   VLDL 29 07/11/2018 0430   LDLCALC 102 (H) 02/23/2020 1500   LABVLDL 25 02/23/2020 1500     Addendum: LDL of 102 on labs today. LDL was 152 prior to starting medication.  Endorses compliance with daily lipitor.   -increase lipitor to 80mg  daily

## 2020-02-23 NOTE — Assessment & Plan Note (Signed)
Protonix dose was recently increased from 20 mg daily to 40 mg daily because symptoms had worsened.  States her symptoms are much better since starting this.  Discussed with her that we eventually would like to discontinue this medication, she is agreeable to tapering in the future.  -Continue Protonix 40 mg daily -Assess for potential to taper dose in the future

## 2020-02-23 NOTE — Assessment & Plan Note (Signed)
Hemoglobin A1C  Date Value Ref Range Status  12/24/2019 8.1 (A) 4.0 - 5.6 % Final  12/04/2018 9.9 (A) 4.0 - 5.6 % Final  10/09/2018 11.3 (A) 4.0 - 5.6 % Final  Not due for A1c until next month. Home blood sugars of greater than 170, though she does not check it frequently. Denies neuropathy or foot wounds.   -increase insulin glargine to 13 units daily for 3 days, then increase to 16 if not hypoglycemic -continue [place] 100mg  daily -continue Synjardy 5-1000mg  BID -Follow-up in 3 weeks

## 2020-02-23 NOTE — Patient Instructions (Addendum)
Thank you for allowing [name] to be a part of your care today, it was a pleasure seeing you. We discussed your depression, anxiety, asthma, hypertension, heart failure, diabetes, cholesterol, and acid reflux  I am checking these labs: BMP, lipid panel  For your diabetes, you A1c is improved from before but still has a little room for improvement.  Increase your insulin to 13 units tonight, and check your blood sugar in the morning and with each meal for the next 3 days.  If it never falls below 120, increase her insulin again to 16 units.  Continue checking your blood sugar and make sure you do not drop too low.  I am checking a few labs because of your high blood pressure and cholesterol, I will call you with the results of these.  Otherwise I think you are doing great.  Continue the current medications for depression, asthma, blood pressure, heart failure, and acid reflux.  I have made these changes to your medications: Increase insulin as above  Please follow up in 3 weeks to recheck A1c    Thank you, and please call the [facility] at [phone] if you have any questions.  Best, Dr. Bridgett Larsson

## 2020-02-23 NOTE — Assessment & Plan Note (Signed)
Symptoms are much improved at this time.  She denies any anxiety, depression, or panic attacks over the past few weeks.  She has gone back to work and is not having any difficulty, although the children have not returned in person yet.  She attributes her improvement to her help and to her religion.  She is seeing a therapist weekly.  -Continue Lexapro 10 mg.  He is to continue for at least 6 months, though she has no desire to discontinue this -Discontinue hydroxyzine, has not required in several months -Continue seeing therapist as needed

## 2020-02-23 NOTE — Assessment & Plan Note (Signed)
Patient has had a mass on the right flank for greater than 10 years which has gradually increased in size.  States has been called lipoma in the past, though no formal imaging or biopsy.  It is not tender.  Does not particularly bother her.  Has never drained.  On exam it is a roughly 3 x 6 cm rubbery mass with well-defined borders.  It is generally smooth, but does seem to be a little firmer at the posterior pole.  -No further work-up at this time, but can consider ultrasound if there are changes

## 2020-02-23 NOTE — Assessment & Plan Note (Signed)
BP: 125/76   Denies chest pain, palpitations, dizziness, LOC.  Has headaches which are typical for her, which are decreasing in frequency and not particularly concerning to her.  -continue Toprol 200mg  daily -continue Entresto 97-103mg  BID -continue spironolactone 25mg  daily -check BMP  Addendum: BMP is unremarkable

## 2020-02-23 NOTE — Progress Notes (Signed)
   CC: Depression, anxiety, hypertension, heart failure, diabetes, cholesterol  HPI:  [name] is a 55 y.o. female with history as below presenting for follow-up on the above. Please refer to problem based charting for further details of assessment and plan of current problem and chronic medical conditions.  Past Medical History:  Diagnosis Date  . Acute pain of left shoulder 06/07/2017  . Asthma    as a child  . Asthma 11/07/2005   Reported Diagnosis in 2012, no formal spirometry   . Cardiomyopathy ([place])    likely nonischemic DCM per Dr. Johnsie Cancel note 04/2017  . Carpal tunnel syndrome   . Chest pain 07/11/2018  . Chronic HFrEF (heart failure with reduced ejection fraction) ([place]) 02/22/2012  . Diabetes mellitus type 2 in obese ([place]) 11/17/2009   - Synjardy 05-998 BID, Januvia 100mg  Daily,  Basaglar - Failed to tolerate Victoza  . Diabetes mellitus type 2, uncontrolled ([facility]) DX: 1999   Started as gestational diabetes  . ESSENTIAL HYPERTENSION 07/08/2006  . Essential hypertension 07/08/2006  . 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 Gallstones   . HSV 11/07/2005   Qualifier: Diagnosis of  By: Pearline Cables MD, Belenda Cruise    . HSV (herpes simplex virus) anogenital infection   . HYPERLIPIDEMIA 08/20/2007  . Hyperlipidemia 08/20/2007   Atorvastatin 40mg  Daily Last Lipid panel may 2019: Total 138, HDL 31, LDL 89. ASCVD risk of 14.5%   . LBBB (left bundle branch block)   . Menorrhagia   . Migraine    Normal CT head (08/06/2006)  . Migraine without aura 09/02/2006   Fairly Well-controlled  2 per month  Takes Elitriptan   . Nonischemic dilated cardiomyopathy ([place]) 03/20/2017  . Perimenopausal menorrhagia 02/20/2012  . Psoriasis 04/29/2013  . Right low back pain 03/02/2014   . Routine adult health maintenance 09/27/2010  . Swelling of abdominal wall 03/02/2014  . Swelling of abdominal wall 03/02/2014  . Symptoms of upper respiratory infection (URI) 04/01/2018  . Viral URI with cough 01/13/2013   Review of Systems:   Review of Systems  Respiratory: Negative for shortness of breath and wheezing.   Cardiovascular: Negative for chest pain, palpitations and leg swelling.  Musculoskeletal: Positive for back pain.  Neurological: Positive for headaches (Similar to prior headaches, decreasing frequency).  Psychiatric/Behavioral: Negative for depression and suicidal ideas. The patient is not nervous/anxious.   All other systems reviewed and are negative.    Physical Exam: Vitals:   02/23/20 1356  BP: 125/76  Pulse: (!) 105  Temp: 98.2 F (36.8 C)  TempSrc: Oral  SpO2: 96%  Weight: 202 lb 1.6 oz (91.7 kg)  Height: 5' (1.524 m)   Constitutional: no acute distress Head: atraumatic ENT: external ears normal Cardiovascular: Tachycardic, regular rhythm, normal heart sounds Pulmonary: effort normal, normal breath sounds bilaterally Abdominal: flat, nontender, no rebound tenderness, bowel sounds normal Musculoskeletal: Multiple roughly 6 x 3 cm rubbery soft mass in the right flank with well-defined borders, generally smooth but one area which seems firmer at the posterior pole, nontender, right paravertebral lumbar muscles are minimally tighter compared to the left Skin: warm and dry Neurological: alert, no focal deficit Psychiatric: normal mood and affect  Assessment & Plan:   See Encounters Tab for problem based charting.  Patient seen with Dr. Daryll Drown

## 2020-02-24 ENCOUNTER — Ambulatory Visit (INDEPENDENT_AMBULATORY_CARE_PROVIDER_SITE_OTHER): Payer: BC Managed Care – PPO | Admitting: Licensed Clinical Social Worker

## 2020-02-24 DIAGNOSIS — F331 Major depressive disorder, recurrent, moderate: Secondary | ICD-10-CM

## 2020-02-24 LAB — LIPID PANEL
Chol/HDL Ratio: 4 ratio (ref 0.0–4.4)
Cholesterol, Total: 170 mg/dL (ref 100–199)
HDL: 43 mg/dL (ref >39)
LDL Chol Calc (NIH): 102 mg/dL — ABNORMAL HIGH (ref 0–99)
Triglycerides: 142 mg/dL (ref 0–149)
VLDL Cholesterol Cal: 25 mg/dL (ref 5–40)

## 2020-02-24 LAB — BMP8+ANION GAP
Anion Gap: 14 mmol/L (ref 10.0–18.0)
BUN/Creatinine Ratio: 22 (ref 9–23)
BUN: 17 mg/dL (ref 6–24)
CO2: 24 mmol/L (ref 20–29)
Calcium: 9.8 mg/dL (ref 8.7–10.2)
Chloride: 104 mmol/L (ref 96–106)
Creatinine, Ser: 0.79 mg/dL (ref 0.57–1.00)
GFR calc Af Amer: 99 mL/min/{1.73_m2} (ref >59)
GFR calc non Af Amer: 86 mL/min/{1.73_m2} (ref >59)
Glucose: 138 mg/dL — ABNORMAL HIGH (ref 65–99)
Potassium: 4.2 mmol/L (ref 3.5–5.2)
Sodium: 142 mmol/L (ref 134–144)

## 2020-02-24 MED ORDER — ATORVASTATIN CALCIUM 80 MG PO TABS: 80.0000 mg | ORAL_TABLET | Freq: Every day | ORAL | 1 refills | Status: DC

## 2020-02-24 NOTE — Progress Notes (Signed)
Virtual Visit via Video Note   I connected with Sandra [name] on 02/24/20 at 3:00pm by video enabled telemedicine application and verified that I am speaking with the correct person using two identifiers.   I discussed the limitations, risks, security and privacy concerns of performing an evaluation and management service by video and the availability of in person appointments. I also discussed with the patient that there may be a patient responsible charge related to this service. The patient expressed understanding and agreed to proceed.   I discussed the assessment and treatment plan with the patient. The patient was provided an opportunity to ask questions and all were answered. The patient agreed with the plan and demonstrated an understanding of the instructions.   The patient was advised to call back or seek an in-person evaluation if the symptoms worsen or if the condition fails to improve as anticipated.   I provided 30 minutes of non-face-to-face time during this encounter.     [name], LCSW, LCAS __________________________________________________________ THERAPIST PROGRESS NOTE   Session Time: 3:00pm - 3:30pm  Location: Patient: Patient home Provider: [facility]    Participation Level: Active   Behavioral Response: Alert, casually dressed, euthymic mood/affect   Type of Therapy:  Individual Therapy   Treatment Goals addressed: Depression and Anxiety management; PCP followup; Medication management; Return to work routine   Interventions: CBT, healthy boundaries, treatment planning    Summary: Sandra [name] is a 55 year old married African American female that presented for virtual session today and is diagnosed with Major Depressive Disorder, recurrent, moderate with anxious distress.        Suicidal/Homicidal: None; without intent or plan   Therapist Response: Clinician met with Sandra [name] for virtual therapy appointment and assessed for safety, medication  compliance, and sobriety.  Sandra [name] presented for today's session on time and was alert, oriented x5, with no evidence or self-report of SI/HI or A/V H.  Sandra [name] reported ongoing compliance with medication and denied any use of alcohol or illicit substances.  Clinician inquired about [place]'s current emotional ratings, as well as any significant changes in thoughts, feelings, or behavior since last check-in.  Sandra [name] reported scores of 2/10 for depression and anxiety, 0/10 for anger/irritability, but denied any reoccurrence of panic attacks.  Sandra [name] reported that she has continued working normal schedule are her job without issue since returning 2 weeks ago, and met with her PCP for followup appointment earlier this week.  Sandra [name] reported that she was "Just feeling a little bit down today" despite these successes.  Clinician inquired about recent triggers which could be influencing Sandra [name]'s mood, and attempts to utilize coping skills.  Sandra [name] reported that she has had contact with family and friends in poor health over past week, and acknowledged that she can be very sensitive to others feelings, which impacts her depression and anxiety as a result.  She reported that when she is feeling down or stressed, using breathing techniques, affirmations, and prayer has been effective, so she would make more of an effort to practice.  Clinician also recommended that Sandra [name] consider whether emotional boundaries within these relationships are in balance or need to be adjusted based upon recent impact to mood.  Sandra [name] reported that she would continue prioritizing self-care to ensure healthy balance, and noted that she will be going out to dinner Saturday with her son, as well as discussing Valentine's Day plans with husband so she has something to look forward to.  Clinician recommended revisiting treatment plan today as  well due to time that has passed since creation, and collaborated with Sandra [name] to make updates as  follows: Meet with clinician for therapy sessions x1 per week to address progress towards goals, as well as barriers to success;  Reduce average anxiety from 2/10 in severity down to 0/10 and panic episodes from 3 down to 1 in next 90 days via utilization of 3-4 relaxation and/or grounding skills daily when related symptoms arise via techniques such as mindful breathing, progressive muscle relaxation, 5-4-3-2-1 grounding, etc; Reduce depression severity from average of 2/10 down to 0/10 in next 90 days by engaging in 1 hour of positive self-care activities daily; Set aside at least 2 hours per week towards socializing with friends and family in order to reduce compartmentalization of feelings, and isolation;  Commit to taking walks with coworkers on break for exercise Monday through Friday for 15-20 minutes on average in order to improve both physical and mental health as well as maintain healthy body weight; Maintain use of sleep hygiene techniques in order to ensure 6-7 hours of rest nightly and reduce related fatigue upon waking; Attend church in person and bible study via phone x1 per week to increase connection with higher power and spiritual community within next 90 days; Keep consistent appointments with PCP to address 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  Progress is evidenced by Sandra [name] reporting that therapy has helped her lower average anxiety and depression severity experienced each day, in addition to keeping panic attacks managed, and returning to normal work schedule without issue.  Clinician will continue to monitor.                 Plan: Meet again in 1 week.   Diagnosis: Major Depressive Disorder, recurrent, moderate with anxious distress.   [name], [place], [place] 02/24/20

## 2020-02-24 NOTE — Progress Notes (Signed)
[facility] Clinic Attending  Case discussed with Dr. Chen  At the time of the visit.  We reviewed the resident's history and exam and pertinent patient test results.  I agree with the assessment, diagnosis, and plan of care documented in the resident's note. 

## 2020-02-24 NOTE — Addendum Note (Signed)
Addended by: Andrew Au on: 02/24/2020 09:59 AM   Modules accepted: Orders

## 2020-02-25 ENCOUNTER — Encounter: Payer: Self-pay | Admitting: *Deleted

## 2020-03-03 ENCOUNTER — Other Ambulatory Visit: Payer: Self-pay

## 2020-03-03 ENCOUNTER — Ambulatory Visit (INDEPENDENT_AMBULATORY_CARE_PROVIDER_SITE_OTHER): Payer: BC Managed Care – PPO | Admitting: Licensed Clinical Social Worker

## 2020-03-03 DIAGNOSIS — F331 Major depressive disorder, recurrent, moderate: Secondary | ICD-10-CM | POA: Diagnosis not present

## 2020-03-03 NOTE — Progress Notes (Signed)
Virtual Visit via Video Note   I connected with Sandra [name] on 03/03/20 at 3:00pm by video enabled telemedicine application and verified that I am speaking with the correct person using two identifiers.   I discussed the limitations, risks, security and privacy concerns of performing an evaluation and management service by video and the availability of in person appointments. I also discussed with the patient that there may be a patient responsible charge related to this service. The patient expressed understanding and agreed to proceed.   I discussed the assessment and treatment plan with the patient. The patient was provided an opportunity to ask questions and all were answered. The patient agreed with the plan and demonstrated an understanding of the instructions.   The patient was advised to call back or seek an in-person evaluation if the symptoms worsen or if the condition fails to improve as anticipated.   I provided 30 minutes of non-face-to-face time during this encounter.     [name], LCSW, LCAS __________________________________________________________ THERAPIST PROGRESS NOTE   Session Time: 3:00pm - 3:30pm  Location: Patient: Patient work Provider: [name]    Participation Level: Active   Behavioral Response: Alert, casually dressed, anxious mood/affect   Type of Therapy:  Individual Therapy   Treatment Goals addressed: Depression and Anxiety management; Medication management; Return to work routine   Interventions: CBT     Summary: Sandra [name] is a 55 year old married African American female that presented for virtual session today and is diagnosed with Major Depressive Disorder, recurrent, moderate with anxious distress.        Suicidal/Homicidal: None; without intent or plan   Therapist Response: Clinician met with Sandra [name] for virtual therapy session and assessed for safety, medication compliance, and sobriety.  Sandra [name] presented for today's  appointment on time and was alert, oriented x5, with no evidence or self-report of SI/HI or A/V H.  Sandra [name] reported that Sandra [name] continues taking medication responsibly and denied any use of alcohol or illicit substances.  Clinician inquired about Sandra [name]'s emotional ratings today, as well as any significant changes in thoughts, feelings, or behavior since previous check-in.  Sandra [name] reported scores of 0/10 for depression, 1/10 for anxiety, and 0/10 for anger/irritability.  Sandra [name] denied any reoccurrence of panic attacks, but noted that Sandra [name] awoke once earlier this week with a tingling sensation in her spine which used to precede attacks, although Sandra [name] was able to calm down by practicing deep breathing.  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  Clinician utilized 'worry exploration' handout with Sandra [name] in order to identify specific anxieties Sandra [name] has regarding this upcoming change to work schedule, evidence which supports and/or disputes her fears in order to promote rational thinking, as well as strategies for coping with this stressor appropriately.  Interventions were effective, as evidenced by Sandra [name] participating in activity and reporting that although Sandra [name] worries about this change in structure at work influencing another panic attack, Sandra [name] acknowledges that Sandra [name] has more coping skills than Sandra [name] used to, has not had another panic attack for months, and looking at these facts promoted confidence in herself, stating "I'm not going to go in afraid.  I feel much better after talking about it because at first I was dreading it, but I have a positive attitude that  it will be okay now". Clinician will continue to monitor.                Plan: Meet again in 1 week.   Diagnosis: Major  Depressive Disorder, recurrent, moderate with anxious distress.   [name], LCSW, LCAS 03/03/20

## 2020-03-08 ENCOUNTER — Other Ambulatory Visit: Payer: Self-pay | Admitting: Cardiovascular Disease

## 2020-03-08 ENCOUNTER — Other Ambulatory Visit: Payer: Self-pay

## 2020-03-08 ENCOUNTER — Ambulatory Visit: Payer: BC Managed Care – PPO | Admitting: Student

## 2020-03-08 DIAGNOSIS — E1169 Type 2 diabetes mellitus with other specified complication: Secondary | ICD-10-CM

## 2020-03-08 DIAGNOSIS — E669 Obesity, unspecified: Secondary | ICD-10-CM

## 2020-03-08 NOTE — Progress Notes (Signed)
   CC: Diabetes  HPI:  Sandra [name] is a 55 y.o. female with history as below presenting for follow-up on the above. Please refer to problem based charting for further details of assessment and plan of current problem and chronic medical conditions.   Past Medical History:  Diagnosis Date  . Acute pain of left shoulder 06/07/2017  . Asthma    as a child  . Asthma 11/07/2005   Reported Diagnosis in 2012, no formal spirometry   . 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 Johnsie Cancel note 04/2017  . Carpal tunnel syndrome   . Chest pain 07/11/2018  . Chronic HFrEF (heart failure with reduced ejection fraction) ([place]) 02/22/2012  . Diabetes mellitus type 2 in obese ([place]) 11/17/2009   - Synjardy 05-998 BID, Januvia 100mg  Daily,  Basaglar - Failed to tolerate Victoza  . Diabetes mellitus type 2, uncontrolled ([facility]) DX: 1999   Started as gestational diabetes  . ESSENTIAL HYPERTENSION 07/08/2006  . Essential hypertension 07/08/2006  . 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 Gallstones   . HSV 11/07/2005   Qualifier: Diagnosis of  By: Pearline Cables MD, Belenda Cruise    . HSV (herpes simplex virus) anogenital infection   . HYPERLIPIDEMIA 08/20/2007  . Hyperlipidemia 08/20/2007   Atorvastatin 40mg  Daily Last Lipid panel may 2019: Total 138, HDL 31, LDL 89. ASCVD risk of 14.5%   . LBBB (left bundle branch block)   . Menorrhagia   . Migraine    Normal CT head (08/06/2006)  . Migraine without aura 09/02/2006   Fairly Well-controlled  2 per month  Takes Elitriptan   . 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 Perimenopausal menorrhagia 02/20/2012  . Psoriasis 04/29/2013  . Right low back pain 03/02/2014  . Routine adult health maintenance 09/27/2010  . Swelling  of abdominal wall 03/02/2014  . Swelling of abdominal wall 03/02/2014  . Symptoms of upper respiratory infection (URI) 04/01/2018  . Viral URI with cough 01/13/2013   Review of Systems:   Review of Systems  Constitutional: Negative for chills and fever.  Genitourinary: Negative for dysuria and frequency.  Neurological: Negative for dizziness.  Endo/Heme/Allergies: Negative for polydipsia.  Psychiatric/Behavioral: Negative for depression. The patient is not nervous/anxious.   All other systems reviewed and are negative.    Physical Exam: Vitals:   03/08/20 1432  BP: 131/64  Pulse: 91  Temp: 98.3 F (36.8 C)  TempSrc: Oral  SpO2: 96%  Weight: 207 lb 12.8 oz (94.3 kg)   Constitutional: no acute distress Head: atraumatic ENT: external ears normal Cardiovascular: regular rate Pulmonary: effort normal Abdominal: flat Skin: warm and dry Neurological: alert, no focal deficit Psychiatric: normal mood and affect  Assessment & Plan:   See Encounters Tab for problem based charting.  Patient discussed with Dr. Evette Doffing

## 2020-03-08 NOTE — Patient Instructions (Signed)
Thank you for allowing [name] to be a part of your care today, it was a pleasure seeing you. We discussed your diabetes and cholesterol  Continue your Lantus at 16 units. Check you blood sugar daily for at least a week and let us know if there are any values below 70.  For your cholesterol, let us know if you have bad muscle cramps from the higher dose.   Please call to schedule your mammogram.  Please follow up in 3 months   Thank you, and please call the [facility] at [phone] if you have any questions.  Best, Dr. Bridgett Larsson

## 2020-03-08 NOTE — Assessment & Plan Note (Signed)
Was seen 2 weeks ago, at which time we increased her insulin glargine from 10 units to 16 units.  Has not been checking her blood sugar regularly, but notes one reading of 165 while she was taking 13 units of insulin, so she increased to 16.  She denies having any symptoms of hypoglycemia.  -insulin glargine 16 units daily -januvia 100mg  daily -synjardy 5-1000mg  BID -Patient agrees to check blood sugar daily for at least a week and report if there are any low blood sugars -Check A1c in 3 months

## 2020-03-09 ENCOUNTER — Ambulatory Visit (INDEPENDENT_AMBULATORY_CARE_PROVIDER_SITE_OTHER): Payer: BC Managed Care – PPO | Admitting: Licensed Clinical Social Worker

## 2020-03-09 DIAGNOSIS — F331 Major depressive disorder, recurrent, moderate: Secondary | ICD-10-CM | POA: Diagnosis not present

## 2020-03-09 MED ORDER — SACUBITRIL-VALSARTAN 97-103 MG PO TABS: 1.0000 | ORAL_TABLET | Freq: Two times a day (BID) | ORAL | 9 refills | Status: DC

## 2020-03-09 NOTE — Progress Notes (Signed)
[facility] Clinic Attending  Case discussed with Dr. Chen  At the time of the visit.  We reviewed the resident's history and exam and pertinent patient test results.  I agree with the assessment, diagnosis, and plan of care documented in the resident's note. 

## 2020-03-09 NOTE — Progress Notes (Signed)
Virtual Visit via Video Note   I connected with Sandra [name] on 03/09/20 at 3:00pm by video enabled telemedicine application and verified that I am speaking with the correct person using two identifiers.   I discussed the limitations, risks, security and privacy concerns of performing an evaluation and management service by video and the availability of in person appointments. I also discussed with the patient that there may be a patient responsible charge related to this service. The patient expressed understanding and agreed to proceed.   I discussed the assessment and treatment plan with the patient. The patient was provided an opportunity to ask questions and all were answered. The patient agreed with the plan and demonstrated an understanding of the instructions.   The patient was advised to call back or seek an in-person evaluation if the symptoms worsen or if the condition fails to improve as anticipated.   I provided 30 minutes of non-face-to-face time during this encounter.     [name], LCSW, LCAS __________________________________________________________ THERAPIST PROGRESS NOTE   Session Time: 3:00pm - 3:30pm  Location: Patient: Patient work Provider: [facility] [place] [facility]    Participation Level: Active   Behavioral Response: Alert, casually dressed, euthymic mood/affect   Type of Therapy:  Individual Therapy   Treatment Goals addressed: Depression, panic attack, and Anxiety management; Medication management; Return to work routine   Interventions: CBT: challenging irrational thoughts      Summary: Sandra [name] is a 55 year old married African American female that presented for virtual session today and is diagnosed with Major Depressive Disorder, recurrent, moderate with anxious distress.        Suicidal/Homicidal: None; without intent or plan   Therapist Response: Clinician met with Raksha for virtual therapy appointment and assessed for safety, medication compliance,  and sobriety.  Takeyla presented for today's session on time and was alert, oriented x5, with no evidence or self-report of SI/HI or A/V H.  Kadin reported ongoing compliance with medication and denied any use of alcohol or illicit substances.  Clinician inquired about Latayvia's current emotional ratings, as well as any significant changes in thoughts, feelings, or behavior since last check-in.  Aparna reported scores of 0/10 for depression, 1/10 for anxiety, and 0/10 for anger/irritability.  Loral reported that she almost experienced a small panic attack on Sunday when she was worrying about the work day ahead, and began to notice familiar tingling sensation in her back.  Anniah reported that she utilized her coping skills to intervene, and found that the next days ahead were not as bad as she anticipated.  Clinician discussed cognitive distortion of 'catastrophizing' with Kasidy, and the impact this can have upon one's outlook and mood.  Clinician also offered helpful strategies for challenging irrational thoughts of this nature, including weighing evidence for and against a thought when it arises, discussing thoughts of this nature with trusted supports for feedback on whether concerns are warranted, and utilizing positive affirmations to increase confidence and assurance with upcoming work challenges.  Progress is evidenced by [name] reporting that this discussion provided her with more insight into thinking processes which influence her panic attacks, and how to intervene more effectively in future should similar thoughts arise and lead to panic symptoms.  Laia reported that she will also prioritize self-care in following days to further reinforce positive outlook, noting that she will be planning a party event for a family member Sunday, and late valentines day trip with husband for following week.  Clinician will continue to monitor.  Plan: Meet again in 1 week.   Diagnosis: Major  Depressive Disorder, recurrent, moderate with anxious distress.   [name], LCSW, LCAS 03/09/20

## 2020-03-15 ENCOUNTER — Encounter: Payer: BC Managed Care – PPO | Admitting: Student

## 2020-03-16 ENCOUNTER — Other Ambulatory Visit: Payer: Self-pay

## 2020-03-16 ENCOUNTER — Ambulatory Visit (INDEPENDENT_AMBULATORY_CARE_PROVIDER_SITE_OTHER): Payer: BC Managed Care – PPO | Admitting: Licensed Clinical Social Worker

## 2020-03-16 DIAGNOSIS — F331 Major depressive disorder, recurrent, moderate: Secondary | ICD-10-CM

## 2020-03-16 NOTE — Progress Notes (Signed)
Virtual Visit via Video Note   I connected with Sandra [name] on 03/16/20 at 3:07pm by video enabled telemedicine application and verified that I am speaking with the correct person using two identifiers.   I discussed the limitations, risks, security and privacy concerns of performing an evaluation and management service by video and the availability of in person appointments. I also discussed with the patient that there may be a patient responsible charge related to this service. The patient expressed understanding and agreed to proceed.   I discussed the assessment and treatment plan with the patient. The patient was provided an opportunity to ask questions and all were answered. The patient agreed with the plan and demonstrated an understanding of the instructions.   The patient was advised to call back or seek an in-person evaluation if the symptoms worsen or if the condition fails to improve as anticipated.   I provided 30 minutes of non-face-to-face time during this encounter.     [name], LCSW, LCAS __________________________________________________________ THERAPIST PROGRESS NOTE   Session Time: 3:07pm - 3:37pm   Location: Patient: Patient home Provider: [facility] [place] [facility]    Participation Level: Active   Behavioral Response: Alert, casually dressed, depressed mood/affect   Type of Therapy:  Individual Therapy   Treatment Goals addressed: Depression, panic attack, and Anxiety management; Medication management   Interventions: CBT, guided meditation       Summary: Patriciaann Rabanal is a 55 year old married African American female that presented for virtual session today and is diagnosed with Major Depressive Disorder, recurrent, moderate with anxious distress.        Suicidal/Homicidal: None; without intent or plan   Therapist Response: Clinician met with Woodie for virtual therapy session and assessed for safety, medication compliance, and sobriety.  Martavia presented for  today's appointment 7 minutes late, reporting that she was held up in traffic from work.  She was alert, oriented x5, with no evidence or self-report of SI/HI or A/V H.  Cheyan reported that she continues to manage medication responsibly and denied any use of alcohol or illicit substances.  Clinician inquired about Kelisha's emotional ratings today, as well as any significant changes in thoughts, feelings, or behavior since previous check-in.  Scherry reported scores of 2/10 for depression, 1/10 for anxiety, and 0/10 for anger/irritability.  Tamala denied any reoccurrence of panic attacks.  Maezie reported that she has been working to keep mind occupied, as her friend passed from cancer on Sunday shortly after she had visited with her.  Clinician expressed sympathy for Joye's loss, and offered the chance to process feelings in session related to her grief.  Yanira declined, and inquired about whether a relaxation exercise could be practiced instead for distraction, stating "The loss hasn't really set in yet, it doesn't feel real to me".  Clinician was agreeable to this, and coached her through guided meditation activity involving visiting a city of her choosing on a trip, adding in various pleasant sensory details to enhance the experience over course of 15 minutes.  Intervention was effective, as evidenced by [name] successfully engaging in this exercise, and reporting that she felt more relaxed afterward, stating "There was a moment when I felt close to my higher power and even though I was trying not to think about my friend, she popped in my mind, and I know that she is in a better place with no more pain".  Clinician encouraged Courtni to stay close to her support system as she processes this loss,  and consider participating in free grief and loss community support groups available through [facility].  Clinician will continue to monitor.                Plan: Meet again in 1 week.    Diagnosis: Major Depressive Disorder, recurrent, moderate with anxious distress.   [name], LCSW, LCAS 03/16/20

## 2020-03-23 ENCOUNTER — Other Ambulatory Visit: Payer: Self-pay

## 2020-03-23 ENCOUNTER — Ambulatory Visit (INDEPENDENT_AMBULATORY_CARE_PROVIDER_SITE_OTHER): Payer: BC Managed Care – PPO | Admitting: Licensed Clinical Social Worker

## 2020-03-23 DIAGNOSIS — F331 Major depressive disorder, recurrent, moderate: Secondary | ICD-10-CM

## 2020-03-23 NOTE — Progress Notes (Signed)
Virtual Visit via Video Note   I connected with Sandra [name] on 03/23/20 at 4:00pm by video enabled telemedicine application and verified that I am speaking with the correct person using two identifiers.   I discussed the limitations, risks, security and privacy concerns of performing an evaluation and management service by video and the availability of in person appointments. I also discussed with the patient that there may be a patient responsible charge related to this service. The patient expressed understanding and agreed to proceed.   I discussed the assessment and treatment plan with the patient. The patient was provided an opportunity to ask questions and all were answered. The patient agreed with the plan and demonstrated an understanding of the instructions.   The patient was advised to call back or seek an in-person evaluation if the symptoms worsen or if the condition fails to improve as anticipated.   I provided 30 minutes of non-face-to-face time during this encounter.     Sandra Flood, LCSW, LCAS __________________________________________________________ THERAPIST PROGRESS NOTE   Session Time: 4:00pm - 4:30pm    Location: Patient: Patient home Provider: [facility] [place] [facility]    Participation Level: Active   Behavioral Response: Alert, casually dressed, depressed mood/affect   Type of Therapy:  Individual Therapy   Treatment Goals addressed: Depression, panic attack, and Anxiety management; Medication management   Interventions: CBT, grief counseling      Summary: Sandra [name] is a 55 year old married African American female that presented for virtual session today and is diagnosed with Major Depressive Disorder, recurrent, moderate with anxious distress.        Suicidal/Homicidal: None; without intent or plan   Therapist Response: Clinician met with Sandra [name] for virtual therapy appointment and assessed for safety, medication compliance, and sobriety.  Sandra [name] presented for  today's session on time and was alert, oriented x5, with no evidence or self-report of SI/HI or A/V H.  Sandra [name] reported ongoing compliance with medication and denied any use of alcohol or illicit substances.  Clinician inquired about Sandra [name]'s current emotional ratings, as well as any significant changes in thoughts, feelings, or behavior since last check-in.  Sandra [name] reported scores of 2/10 for depression, 2/10 for anxiety, and 2/10 for anger.  Sandra [name] denied experiencing any panic attacks, but stated "I had a breakdown at work today and was crying because I have to attend my friend's funeral 04-15-22 and someone else died and they have a funeral next week too".  Clinician expressed sympathy for Sandra [name]'s losses, and discussed the stages of grief model with her to increase insight into emotional states Sandra [name] may experience as Sandra [name] comes to term with recent events.  Sandra [name] acknowledged that Sandra [name] had experienced denial initially, as it didn't seem real that Sandra [name] would be faced with challenges like this in such quick succession, and now Sandra [name] feels like Sandra [name] has entered the depression stage.  Clinician explored strategies with Sandra [name] that could be utilized to process her grief in healthy ways as Sandra [name] transitions through these stages, including staying close to supportive family and friends Sandra [name] can be more open and honest with regarding feelings, as well as using relaxation skills to manage anxiety, and ensuring time outside of busy work schedule for personal self-care and reflection.  Intervention was effective, as evidenced by Sandra [name] reporting that discussing these stressors in session today was helpful, as it reinforced the importance of expressing herself in a safe space, and provided additional ideas on how to grieve these losses without relying on excessive compartmentalization.  Sandra [name] stated "I need to talk to people more about how I'm feelings, and not keep it all inside".  Clinician reminded Sandra [name] that free grief and  loss groups are available through [facility], and ensured Sandra [name] had information for this referral on hand to increase support. Clinician will continue to monitor.                Plan: Meet again in 1 week.   Diagnosis: Major Depressive Disorder, recurrent, moderate with anxious distress.   Sandra [name], [place], LCAS 03/23/20

## 2020-03-24 NOTE — Progress Notes (Signed)
Disregard-additional note made in error.

## 2020-04-07 ENCOUNTER — Other Ambulatory Visit: Payer: Self-pay | Admitting: Student

## 2020-04-07 ENCOUNTER — Ambulatory Visit (INDEPENDENT_AMBULATORY_CARE_PROVIDER_SITE_OTHER): Payer: BC Managed Care – PPO | Admitting: Licensed Clinical Social Worker

## 2020-04-07 ENCOUNTER — Other Ambulatory Visit: Payer: Self-pay

## 2020-04-07 DIAGNOSIS — F331 Major depressive disorder, recurrent, moderate: Secondary | ICD-10-CM

## 2020-04-07 DIAGNOSIS — E1169 Type 2 diabetes mellitus with other specified complication: Secondary | ICD-10-CM

## 2020-04-07 NOTE — Progress Notes (Signed)
Virtual Visit via Video Note   I connected with Sandra [name] on 04/07/20 at 3:05pm by video enabled telemedicine application and verified that I am speaking with the correct person using two identifiers.   I discussed the limitations, risks, security and privacy concerns of performing an evaluation and management service by video and the availability of in person appointments. I also discussed with the patient that there may be a patient responsible charge related to this service. The patient expressed understanding and agreed to proceed.   I discussed the assessment and treatment plan with the patient. The patient was provided an opportunity to ask questions and all were answered. The patient agreed with the plan and demonstrated an understanding of the instructions.   The patient was advised to call back or seek an in-person evaluation if the symptoms worsen or if the condition fails to improve as anticipated.   I provided 30 minutes of non-face-to-face time during this encounter.     [name], LCSW, LCAS _______________________________ THERAPIST PROGRESS NOTE   Session Time: 3:05pm - 3:35pm   Location: Patient: Patient home Provider: [facility]    Participation Level: Active   Behavioral Response: Alert, casually dressed, depressed mood/affect   Type of Therapy:  Individual Therapy   Treatment Goals addressed: Depression, panic attack, and Anxiety management; Medication management; Socialization with support system; Employment   Interventions: CBT, tasks of mourning   Summary: Sandra [name] is a 55 year old married African American female that presented for virtual session today and is diagnosed with Major Depressive Disorder, recurrent, moderate with anxious distress.        Suicidal/Homicidal: None; without intent or plan   Therapist Response: Clinician met with Sandra [name] for virtual therapy session and assessed for safety, medication compliance, and sobriety.  Sandra [name]  presented for today's appointment 5 minutes late and reported that she was having connectivity issues.  She presented as alert, oriented x5, with no evidence or self-report of active SI/HI or A/V H.  Sandra [name] reported that she continues managing medication responsibly and denied any use of alcohol or illicit substances.  Clinician inquired about Sandra [name]'s emotional ratings today, as well as any significant changes in thoughts, feelings, or behavior since previous check-in.  Sandra [name] reported scores of 2/10 for depression, 2/10 for anxiety, and 2/10 for anger.  Sandra [name] denied experiencing any panic attacks, but noted that she has been feeling more overwhelmed due to having 3 people close to her pass within 2-3 weeks.  Sandra [name] stated "I'm tired of all this death.  I had a funeral this past weekend and another to plan for on Tuesday".   Clinician expressed sympathy for Sandra [name]'s losses, and inquired about how she has attempted to cope with these challenges. Sandra [name] reported that she has stayed close to family and talks to them daily, in addition to maintaining regular work routine, noting that it keeps her preoccupied and coworkers have prayed with her on days when she is overcome with emotion.  Sandra [name] reported that she and her husband have also been planning for an upcoming trip to [facility], stating "I need a break from [place], from the reality of everything".  Clinician utilized a handout with Sandra [name] today focused upon the various tasks of mourning (i.e. accepting the reality of the losses, processing the emotional pain that arises, learning to adjust to a world without these loved ones, and finding ways to remember them in meaningful ways) to faciliate grieving process in healthy manner.  Intervention was effective, as evidenced  by Sandra [name] acknowledging that although she has stayed busy to try to distract herself, she will need to take time for self-reflection to properly adjust to these losses and feels more  prepared for handling difficult days ahead, stating "Talking to you helps me get these feelings out and gain a different perspective.  God must have known this would happen, but he has built me up where I wouldn't crumble as easy".   Sandra [name] requested to end session at halfway mark due to feeling emotional fatigue, and agreed to follow up in 1 week.  Clinician will continue to monitor.                Plan: Meet again in 1 week.   Diagnosis: Major Depressive Disorder, recurrent, moderate with anxious distress.   [name], [place], LCAS 04/07/20

## 2020-04-20 ENCOUNTER — Ambulatory Visit (INDEPENDENT_AMBULATORY_CARE_PROVIDER_SITE_OTHER): Payer: BC Managed Care – PPO | Admitting: Licensed Clinical Social Worker

## 2020-04-20 ENCOUNTER — Other Ambulatory Visit: Payer: Self-pay

## 2020-04-20 DIAGNOSIS — F331 Major depressive disorder, recurrent, moderate: Secondary | ICD-10-CM

## 2020-04-20 NOTE — Progress Notes (Signed)
Virtual Visit via Video Note   I connected with Sandra [name] on 04/20/20 at 3:00pm by video enabled telemedicine application and verified that I am speaking with the correct person using two identifiers.   I discussed the limitations, risks, security and privacy concerns of performing an evaluation and management service by video and the availability of in person appointments. I also discussed with the patient that there may be a patient responsible charge related to this service. The patient expressed understanding and agreed to proceed.   I discussed the assessment and treatment plan with the patient. The patient was provided an opportunity to ask questions and all were answered. The patient agreed with the plan and demonstrated an understanding of the instructions.   The patient was advised to call back or seek an in-person evaluation if the symptoms worsen or if the condition fails to improve as anticipated.   I provided 30 minutes of non-face-to-face time during this encounter.     [name], LCSW, LCAS _______________________________ THERAPIST PROGRESS NOTE   Session Time: 3:00pm - 3:30pm    Location: Patient: Patient home Provider: [facility]    Participation Level: Active   Behavioral Response: Alert, casually dressed, depressed mood/affect   Type of Therapy:  Individual Therapy   Treatment Goals addressed: Depression, panic attack, and anxiety management; Medication management; Socialization with support system; Employment   Interventions: CBT, stages of grief, nutritional and pain assessments, PHQ9, GAD7, and CSSRS screenings   Summary: Sandra [name] is a 55 year old married African American female that presented for therapy session today and is diagnosed with Major Depressive Disorder, recurrent, moderate with anxious distress.        Suicidal/Homicidal: None; without intent or plan   Therapist Response: Clinician met with Sandra [name] for virtual therapy  appointment and assessed for safety, medication compliance, and sobriety.  Sandra [name] presented for today's session on time and was alert, oriented x5, with no evidence or self-report of active SI/HI or A/V H.  Sandra [name] reported that she ongoing compliance with medication and denied any use of alcohol or illicit substances.  Clinician inquired about Sandra [name]'s current emotional ratings, as well as any significant changes in thoughts, feelings, or behavior since last check-in.  Sandra [name] reported scores of 2/10 for depression, 2/10 for anxiety, and 0/10 for anger/irritability.  Sandra [name] reported that she experienced "A little panic attack" on the day of a funeral she attended last week, but used deep breathing and prayer to calm herself down, which shortened the episode to 5 minutes.  Sandra [name] reported that she attended a total of two funerals in past week, and opened up about her experiences with each and the emotions that arose as a result.  Sandra [name] stated "I think I'm in a good place.  I still think about them sometimes, but I try not to linger on it too much".   Clinician revisited discussion on stages of grief with [place] (i.e. denial, depression, bargaining, anger, acceptance) and encouraged her to stick close to supports, practice coping skills, and draw comfort from her faith/higher power in order to continue processing these losses in healthy manner.  Sandra [name] was agreeable to this approach, and noted that she took time off work last week for bereavement, and travelled with her husband to [place] for vacation, stating "I really enjoyed myself and it helped take my mind off things".  Clinician completed updated pain and nutritional assessments with Sandra [name], revealing no issues to address at this time.  Sandra [name] also participated in [place],  GAD7, and CSSRS screenings with clinician, reflecting decreasing depression and anxiety levels, as well as showing no risk of self-harm at this time.  Interventions were effective, as  evidenced by Sandra [name] showing ongoing progress with grief, and management of panic attacks despite recent challenges.  Sandra [name] stated "Its always good to talk with you because it keeps me focused on my goals and helps to make sure I stay on the right path".  Clinician will continue to monitor.     Plan: Meet again in 2 weeks.   Diagnosis: Major Depressive Disorder, recurrent, moderate with anxious distress.   [name], LCSW, LCAS 04/20/20

## 2020-04-21 ENCOUNTER — Other Ambulatory Visit: Payer: Self-pay | Admitting: Cardiovascular Disease

## 2020-05-04 ENCOUNTER — Ambulatory Visit (HOSPITAL_COMMUNITY): Payer: BC Managed Care – PPO | Admitting: Licensed Clinical Social Worker

## 2020-05-04 ENCOUNTER — Other Ambulatory Visit: Payer: Self-pay

## 2020-05-04 ENCOUNTER — Telehealth (HOSPITAL_COMMUNITY): Payer: Self-pay | Admitting: Licensed Clinical Social Worker

## 2020-05-04 LAB — HM PAP SMEAR

## 2020-05-04 NOTE — Telephone Encounter (Signed)
Brynlynn had a virtual therapy appointment scheduled today at [place] presented to this appointment on time and was alert, oriented x5, with no evidence or self-report of active SI/HI or A/V H.  Clinician became aware in this virtual meeting that Federica was at a retail store shopping, and pointed out the risk to confidentiality that this posed.  Clinician inquired about whether Kesa could relocate to a more private location to speak, or if meeting should be rescheduled for a later date.  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  Clinician encouraged Elvira to stay consistent with these therapy sessions in order to prioritize mental health, and will continue to monitor.    [name], [place], LCAS 05/04/20

## 2020-05-10 ENCOUNTER — Other Ambulatory Visit: Payer: Self-pay | Admitting: Internal Medicine

## 2020-05-17 ENCOUNTER — Other Ambulatory Visit: Payer: Self-pay | Admitting: Cardiovascular Disease

## 2020-05-18 ENCOUNTER — Other Ambulatory Visit: Payer: Self-pay

## 2020-05-18 ENCOUNTER — Ambulatory Visit (HOSPITAL_COMMUNITY): Payer: BC Managed Care – PPO | Admitting: Licensed Clinical Social Worker

## 2020-05-30 ENCOUNTER — Telehealth: Payer: Self-pay

## 2020-05-30 NOTE — Telephone Encounter (Signed)
Pt is requesting a call back she stated that her ( right ) ear has been hurting .Marland KitchenMarland Kitchen

## 2020-05-30 NOTE — Telephone Encounter (Signed)
RTC, patient c/o tinnitus X 1 month in right ear.  Now states she has a feeling of fullness in right ear "with sounds of ocean in ear".  C/o slight pain in ear and teeth.  First available opening is Wednesday, appt made for Wednesday, 06/01/20 @ [place] w/ Dr. Bridgett Larsson.  Pt encouraged to check back this afternoon and in the morning for any cancellations, she verbalized understanding. SChaplin, RN,BSN

## 2020-05-31 ENCOUNTER — Other Ambulatory Visit: Payer: Self-pay | Admitting: Internal Medicine

## 2020-06-01 ENCOUNTER — Encounter: Payer: Self-pay | Admitting: Dietician

## 2020-06-01 ENCOUNTER — Encounter: Payer: Self-pay | Admitting: Student

## 2020-06-01 ENCOUNTER — Other Ambulatory Visit: Payer: Self-pay

## 2020-06-01 ENCOUNTER — Ambulatory Visit (INDEPENDENT_AMBULATORY_CARE_PROVIDER_SITE_OTHER): Payer: BC Managed Care – PPO | Admitting: Student

## 2020-06-01 VITALS — BP 144/71 | HR 89 | Temp 98.2°F | Ht 60.0 in | Wt 206.3 lb

## 2020-06-01 DIAGNOSIS — H9311 Tinnitus, right ear: Secondary | ICD-10-CM | POA: Diagnosis not present

## 2020-06-01 DIAGNOSIS — R519 Headache, unspecified: Secondary | ICD-10-CM | POA: Diagnosis not present

## 2020-06-01 DIAGNOSIS — H9319 Tinnitus, unspecified ear: Secondary | ICD-10-CM

## 2020-06-01 HISTORY — DX: Tinnitus, unspecified ear: H93.19

## 2020-06-01 NOTE — Progress Notes (Incomplete)
   CC: ***  HPI:  Ms.Valaree A Lal is a 55 y.o. female with history as below presenting for ***. Please refer to problem based charting for further details of assessment and plan of current problem and chronic medical conditions.  GAD Occurred after COVID infection this winter. She responded well to lexapro and therapy, has since gone back to work and feels symptoms are much improved***. Is *** seeing a therapist. No more panic attacks.  -Continue Lexapro 10 mg. He is to continue for at least 6 months, though she has no desire to discontinue this -Continue seeing therapist as needed  DM Lab Results  Component Value Date   HGBA1C 8.1 (A) 12/24/2019   HGBA1C 9.9 (A) 12/04/2018   HGBA1C 11.3 (A) 10/09/2018  At last visit increased insulin glargine from 10 to 16 units. *** Denies neuropathy or foot wounds***.    Past Medical History:  Diagnosis Date  . Acute pain of left shoulder 06/07/2017  . Asthma    as a child  . Asthma 11/07/2005   Reported Diagnosis in 2012, no formal spirometry   . 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 Johnsie Cancel note 04/2017  . Carpal tunnel syndrome   . Chest pain 07/11/2018  . Chronic HFrEF (heart failure with reduced ejection fraction) ([place]) 02/22/2012  . Diabetes mellitus type 2 in obese ([place]) 11/17/2009   - Synjardy 05-998 BID, Januvia 100mg  Daily,  Basaglar - Failed to tolerate Victoza  . Diabetes mellitus type 2, uncontrolled ([facility]) DX: 1999   Started as gestational diabetes  . ESSENTIAL HYPERTENSION 07/08/2006  . Essential hypertension 07/08/2006  . 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 Gallstones   . HSV 11/07/2005   Qualifier: Diagnosis of  By: Pearline Cables MD, Belenda Cruise    . HSV (herpes simplex virus) anogenital  infection   . HYPERLIPIDEMIA 08/20/2007  . Hyperlipidemia 08/20/2007   Atorvastatin 40mg  Daily Last Lipid panel may 2019: Total 138, HDL 31, LDL 89. ASCVD risk of 14.5%   . LBBB (left bundle branch block)   . Menorrhagia   . Migraine    Normal CT head (08/06/2006)  . Migraine without aura 09/02/2006   Fairly Well-controlled  2 per month  Takes Elitriptan   . 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 Perimenopausal menorrhagia 02/20/2012  . Psoriasis 04/29/2013  . Right low back pain 03/02/2014  . Routine adult health maintenance 09/27/2010  . Swelling of abdominal wall 03/02/2014  . Swelling of abdominal wall 03/02/2014  . Symptoms of upper respiratory infection (URI) 04/01/2018  . Viral URI with cough 01/13/2013   Review of Systems:   ROS ***  Physical Exam: There were no vitals filed for this visit. Constitutional: no acute distress Head: atraumatic ENT: external ears normal Cardiovascular: regular rate and rhythm, normal heart sounds Pulmonary: effort normal, normal breath sounds bilaterally Abdominal: flat, nontender, no rebound tenderness, bowel sounds normal Musculoskeletal: *** Skin: warm and dry Neurological: alert, no focal deficit Psychiatric: normal mood and affect  Assessment & Plan:   See Encounters Tab for problem based charting.  Patient {GC/GE:[id number]::"discussed with","seen with"} Dr. {NAMES:[id number]::"Butcher","Guilloud","Hoffman","Mullen","Narendra","Raines","Vincent","Williams"}

## 2020-06-01 NOTE — Progress Notes (Signed)
   CC: Tinnitus  HPI:  [name] is a 55 y.o. female with history as below presenting for tinnitus. Please refer to problem based charting for further details of assessment and plan of current problem and chronic medical conditions.  Past Medical History:  Diagnosis Date  . Acute pain of left shoulder 06/07/2017  . Asthma    as a child  . Asthma 11/07/2005   Reported Diagnosis in 2012, no formal spirometry   . Cardiomyopathy ([place])    likely nonischemic DCM per Sandra [name] note 04/2017  . Carpal tunnel syndrome   . Chest pain 07/11/2018  . Chronic HFrEF (heart failure with reduced ejection fraction) ([place]) 02/22/2012  . Diabetes mellitus type 2 in obese ([place]) 11/17/2009   - Synjardy 05-998 BID, Januvia 100mg  Daily,  Basaglar - Failed to tolerate Victoza  . Diabetes mellitus type 2, uncontrolled ([facility]) DX: 1999   Started as gestational diabetes  . ESSENTIAL HYPERTENSION 07/08/2006  . Essential hypertension 07/08/2006  . 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 Gallstones   . HSV 11/07/2005   Qualifier: Diagnosis of  By: Sandra [name], Sandra [name]    . HSV (herpes simplex virus) anogenital infection   . HYPERLIPIDEMIA 08/20/2007  . Hyperlipidemia 08/20/2007   Atorvastatin 40mg  Daily Last Lipid panel may 2019: Total 138, HDL 31, LDL 89. ASCVD risk of 14.5%   . LBBB (left bundle branch block)   . Menorrhagia   . Migraine    Normal CT head (08/06/2006)  . Migraine without aura 09/02/2006   Fairly Well-controlled  2 per month  Takes Elitriptan   . 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 Perimenopausal menorrhagia 02/20/2012  . Psoriasis 04/29/2013  . Right low back pain 03/02/2014  . Routine adult health maintenance 09/27/2010  . Swelling of abdominal wall  03/02/2014  . Swelling of abdominal wall 03/02/2014  . Symptoms of upper respiratory infection (URI) 04/01/2018  . Viral URI with cough 01/13/2013   Review of Systems:   Review of Systems  Constitutional: Negative for chills and fever.  HENT: Positive for ear pain and tinnitus. Negative for congestion, ear discharge, hearing loss, sinus pain and sore throat.   Respiratory: Negative for cough and shortness of breath.   Neurological: Positive for headaches. Negative for dizziness, speech change, focal weakness and weakness.  Psychiatric/Behavioral: Negative for depression. The patient is not nervous/anxious.   All other systems reviewed and are negative.    Physical Exam: Vitals:   06/01/20 1517  BP: (!) 144/71  Pulse: 89  Temp: 98.2 F (36.8 C)  TempSrc: Oral  SpO2: 100%  Weight: 206 lb 4.8 oz (93.6 kg)  Height: 5' (1.524 m)   Constitutional: no acute distress Head: atraumatic ENT: external ears normal, moderate amount of wax in the right ear which was grossly improved after irrigation, right TM is normal-appearing, left ear canal and TM is clear Pulmonary: effort normal Abdominal: flat Skin: warm and dry Neurological: alert, no focal deficit Psychiatric: normal mood and affect  Assessment & Plan:   See Encounters Tab for problem based charting.  Patient discussed with Sandra [name]

## 2020-06-01 NOTE — Patient Instructions (Signed)
Thank you for allowing [name] to be a part of your care today, it was a pleasure seeing you. We discussed the ringing on your right ear. The medical word for this is tinnitus.  We cleaned out your ear by irrigating it.  Avoid using Q tips or other things to manually clean the ear.  You may buy ear drops at the pharmacy to dissolve the wax.  I am referring you to audiology.  If there is an abnormality on their testing and if your tinnitus does not get better with the washing, I will plan to refer you to a Ear Nose and Throat specialist.   Thank you, and please call the Internal Medicine Clinic at [phone] if you have any questions.  Best, Dr. Bridgett Larsson

## 2020-06-01 NOTE — Assessment & Plan Note (Addendum)
Patient complains of high-pitched ringing in the right ear over the past 3 months.  It occurs a few times a week, primarily at nighttime when she gets up to use the bathroom.  Has happened a few times during the day as well.  Occasionally has sharp pain in the right ear as well.  Has continued to have headaches which are baseline for her.  Denies dizziness, hearing difficulty, falls.  No medication changes or recent respiratory illness.  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  No trauma.  She takes a low-dose aspirin daily.  Denies BC or other aspirin use besides that.  On exam, there is a moderate amount of wax buildup in the right ear and loss of the left ear.  Hearing seems grossly within normal limits.  - Irrigated right ear.  Patient became dizzy early during this process so was stopped early, but earwax was sufficiently debulked - Instructed patient to not clean ear manually with Q-tips, advised to buy dissolving drops over-the-counter if needed - Instructed patient to not listen to loud music - Referral to audiology and ENT - We will readdress at follow-up in 2 weeks which is already scheduled for regular issues

## 2020-06-02 NOTE — Addendum Note (Signed)
Addended by: Andrew Au on: 06/02/2020 11:50 AM   Modules accepted: Orders

## 2020-06-03 NOTE — Progress Notes (Signed)
[facility] Clinic Attending  Case discussed with Dr. Chen  At the time of the visit.  We reviewed the resident's history and exam and pertinent patient test results.  I agree with the assessment, diagnosis, and plan of care documented in the resident's note. 

## 2020-06-08 ENCOUNTER — Ambulatory Visit (INDEPENDENT_AMBULATORY_CARE_PROVIDER_SITE_OTHER): Payer: BC Managed Care – PPO | Admitting: Licensed Clinical Social Worker

## 2020-06-08 ENCOUNTER — Other Ambulatory Visit: Payer: Self-pay

## 2020-06-08 DIAGNOSIS — F331 Major depressive disorder, recurrent, moderate: Secondary | ICD-10-CM | POA: Diagnosis not present

## 2020-06-08 NOTE — Progress Notes (Signed)
Virtual Visit via Video Note   I connected with Sandra [name] on 06/08/20 at 3:00pm by video enabled telemedicine application and verified that I am speaking with the correct person using two identifiers.   I discussed the limitations, risks, security and privacy concerns of performing an evaluation and management service by video and the availability of in person appointments. I also discussed with the patient that there may be a patient responsible charge related to this service. The patient expressed understanding and agreed to proceed.   I discussed the assessment and treatment plan with the patient. The patient was provided an opportunity to ask questions and all were answered. The patient agreed with the plan and demonstrated an understanding of the instructions.   The patient was advised to call back or seek an in-person evaluation if the symptoms worsen or if the condition fails to improve as anticipated.   I provided 30 minutes of non-face-to-face time during this encounter.     [name], LCSW, LCAS _______________________________ THERAPIST PROGRESS NOTE   Session Time: 3:00pm - 3:30pm   Location: Patient: Patient home Provider: [facility]    Participation Level: Active   Behavioral Response: Alert, casually dressed, depressed mood/affect   Type of Therapy:  Individual Therapy   Treatment Goals addressed: Depression, panic attack, and anxiety management; Medication management; Socialization with support system; Employment   Interventions: CBT, conflict resolution skills    Summary: Sandra [name] is a 55 year old married African American female that presented for therapy session today and is diagnosed with Major Depressive Disorder, recurrent, moderate with anxious distress.        Suicidal/Homicidal: None; without intent or plan   Therapist Response: Clinician met with Sandra [name] for virtual therapy session and assessed for safety, medication compliance,  and sobriety.  Sandra [name] presented for today's appointment on time and was alert, oriented x5, with no evidence or self-report of active SI/HI or A/V H.  Sandra [name] reported that she continues taking medication as prescribed and denied any use of alcohol or illicit substances.  Clinician inquired about Sandra [name] emotional ratings today, as well as any significant changes in thoughts, feelings, or behavior since previous check-in.  Sandra [name] reported scores of 2/10 for depression, 2/10 for anxiety, and 0/10 for anger/irritability.  Sandra [name] denied any panic attacks.  Sandra [name] reported that she has been maintaining work schedule and including enjoyable activities outside of work for self-care, such as attending a masquerade ball for a friend's birthday.  Sandra [name] reported that she is still grieving recent losses, but is staying close and honest with supportive family members.  Sandra [name] reported that her biggest issue has been dealing with difficult employees in the workplace, stating "Since I've come back to work, I've had to catch myself before I say anything I might regret".  Clinician assisted Sandra [name] by covering a handout on conflict resolution skills that could be implemented in her workplace to handle issues with coworkers.  These included focusing on the problem rather than the person in order to prevent things from becoming personal, using reflective listening to increase empathy/understanding towards both parties, utilizing "I" statements to express feelings without coming off as critical, working towards a mutual compromise so both people are satisfied, and taking time-outs when necessary should either person become aggressive or insulting.  Intervention was effective, as evidenced by Sandra [name] expressing interest in implementing these skills in the workplace to keep future interactions civil, stating "This gave me more clarity on how I can handle situations better.  I want to be a positive example for other people at my  job and not get dragged into any negativity".  Clinician will continue to monitor.      Plan: Meet again in 2 weeks.   Diagnosis: Major Depressive Disorder, recurrent, moderate with anxious distress.   [name], LCSW, LCAS 06/08/20

## 2020-06-13 ENCOUNTER — Other Ambulatory Visit: Payer: Self-pay | Admitting: Student

## 2020-06-13 ENCOUNTER — Encounter: Payer: BC Managed Care – PPO | Admitting: Student

## 2020-06-13 ENCOUNTER — Other Ambulatory Visit: Payer: Self-pay | Admitting: Internal Medicine

## 2020-06-13 DIAGNOSIS — E1169 Type 2 diabetes mellitus with other specified complication: Secondary | ICD-10-CM

## 2020-06-21 ENCOUNTER — Ambulatory Visit (INDEPENDENT_AMBULATORY_CARE_PROVIDER_SITE_OTHER): Payer: BC Managed Care – PPO | Admitting: Student

## 2020-06-21 ENCOUNTER — Other Ambulatory Visit: Payer: Self-pay

## 2020-06-21 ENCOUNTER — Encounter: Payer: Self-pay | Admitting: Student

## 2020-06-21 VITALS — BP 137/84 | HR 81 | Temp 99.1°F | Ht 60.0 in | Wt 206.0 lb

## 2020-06-21 DIAGNOSIS — Z0000 Encounter for general adult medical examination without abnormal findings: Secondary | ICD-10-CM

## 2020-06-21 DIAGNOSIS — E669 Obesity, unspecified: Secondary | ICD-10-CM

## 2020-06-21 DIAGNOSIS — F411 Generalized anxiety disorder: Secondary | ICD-10-CM

## 2020-06-21 DIAGNOSIS — E1169 Type 2 diabetes mellitus with other specified complication: Secondary | ICD-10-CM | POA: Diagnosis not present

## 2020-06-21 DIAGNOSIS — H9311 Tinnitus, right ear: Secondary | ICD-10-CM | POA: Diagnosis not present

## 2020-06-21 LAB — POCT GLYCOSYLATED HEMOGLOBIN (HGB A1C): Hemoglobin A1C: 7.9 % — AB (ref 4.0–5.6)

## 2020-06-21 LAB — GLUCOSE, CAPILLARY: Glucose-Capillary: 156 mg/dL — ABNORMAL HIGH (ref 70–99)

## 2020-06-21 IMAGING — DX CHEST - 2 VIEW
2 series · 2 of 2 positions shown · non-contrast
Comparison: 12/12/2017

CLINICAL DATA: Central chest pain

EXAM:
CHEST - 2 VIEW

[chest pa]
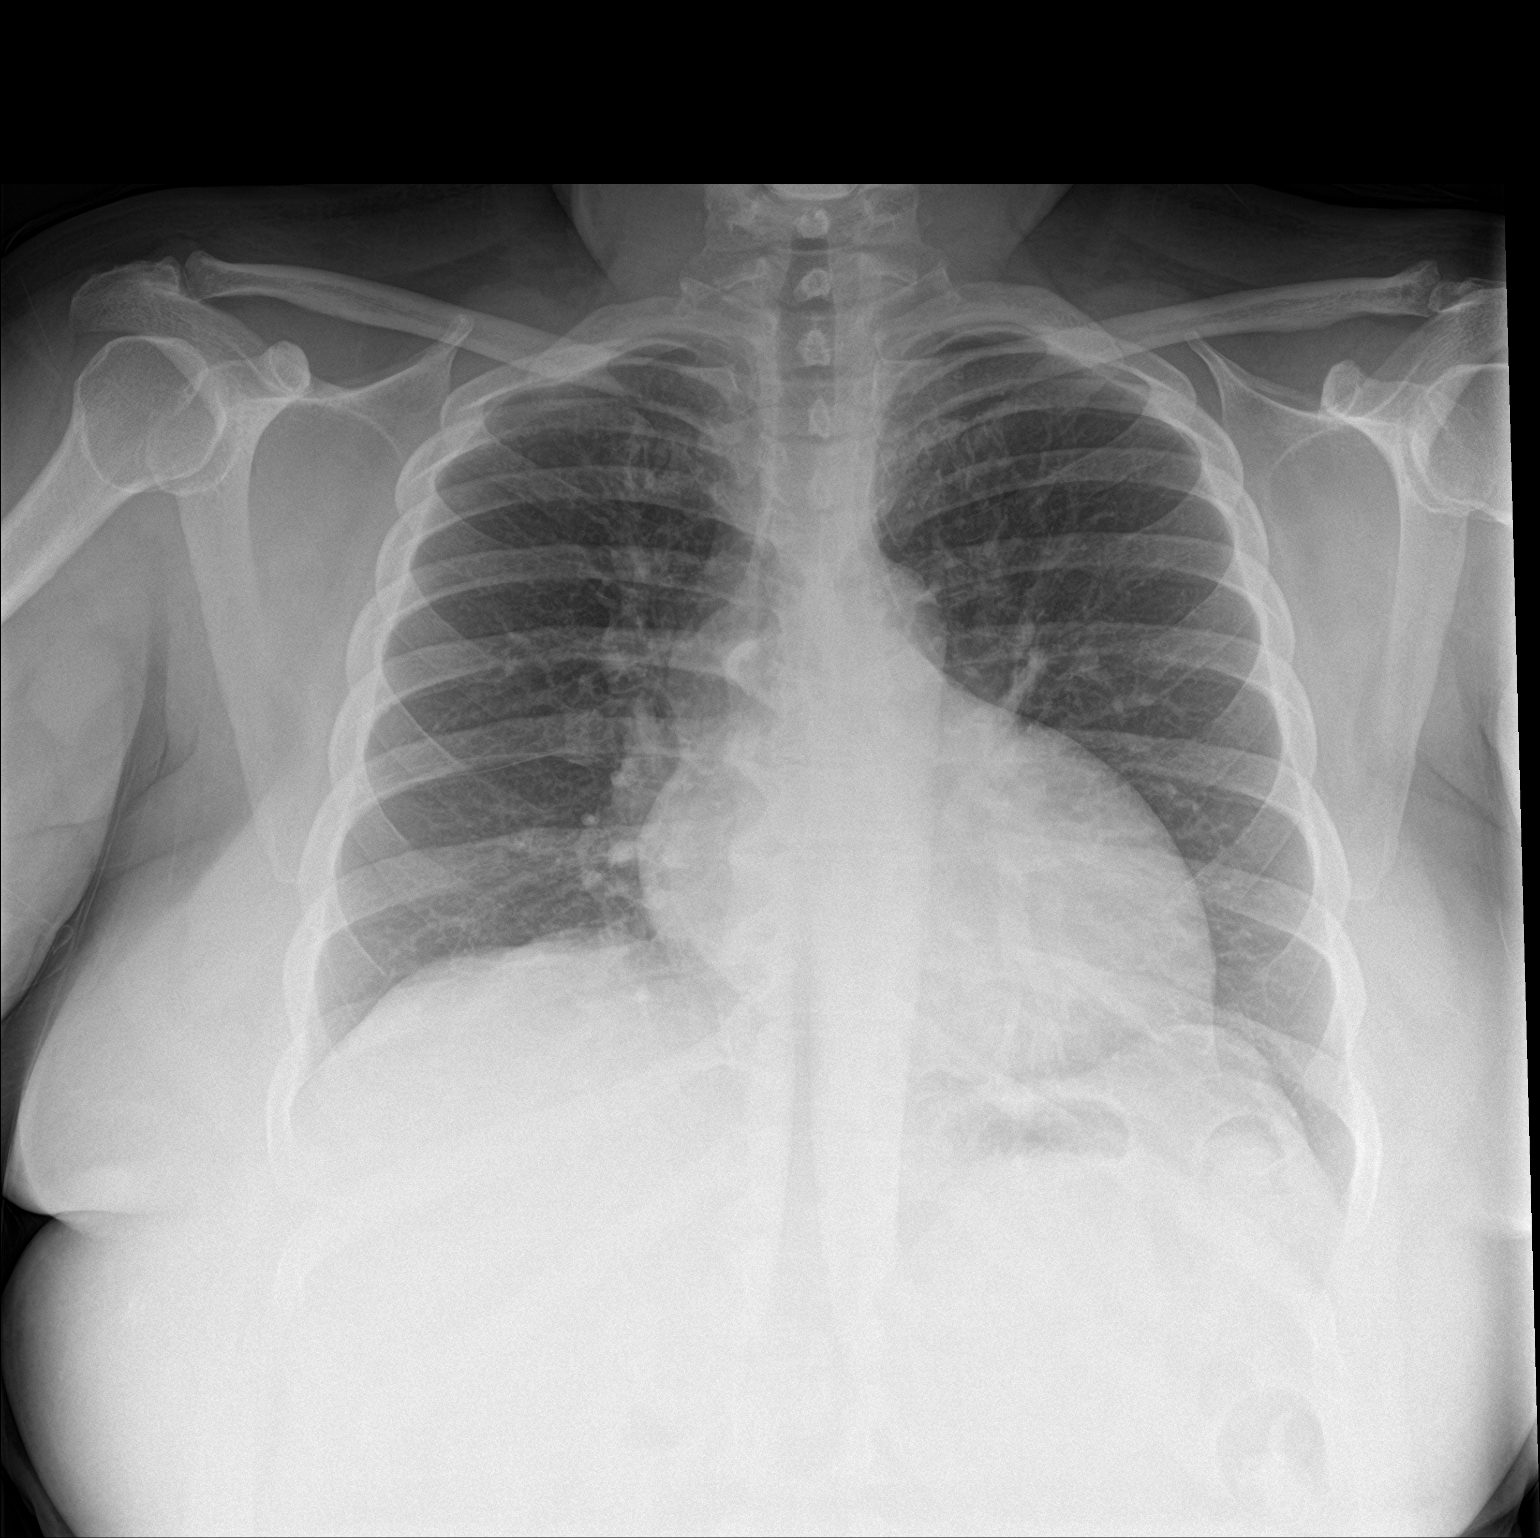

[chest lat]
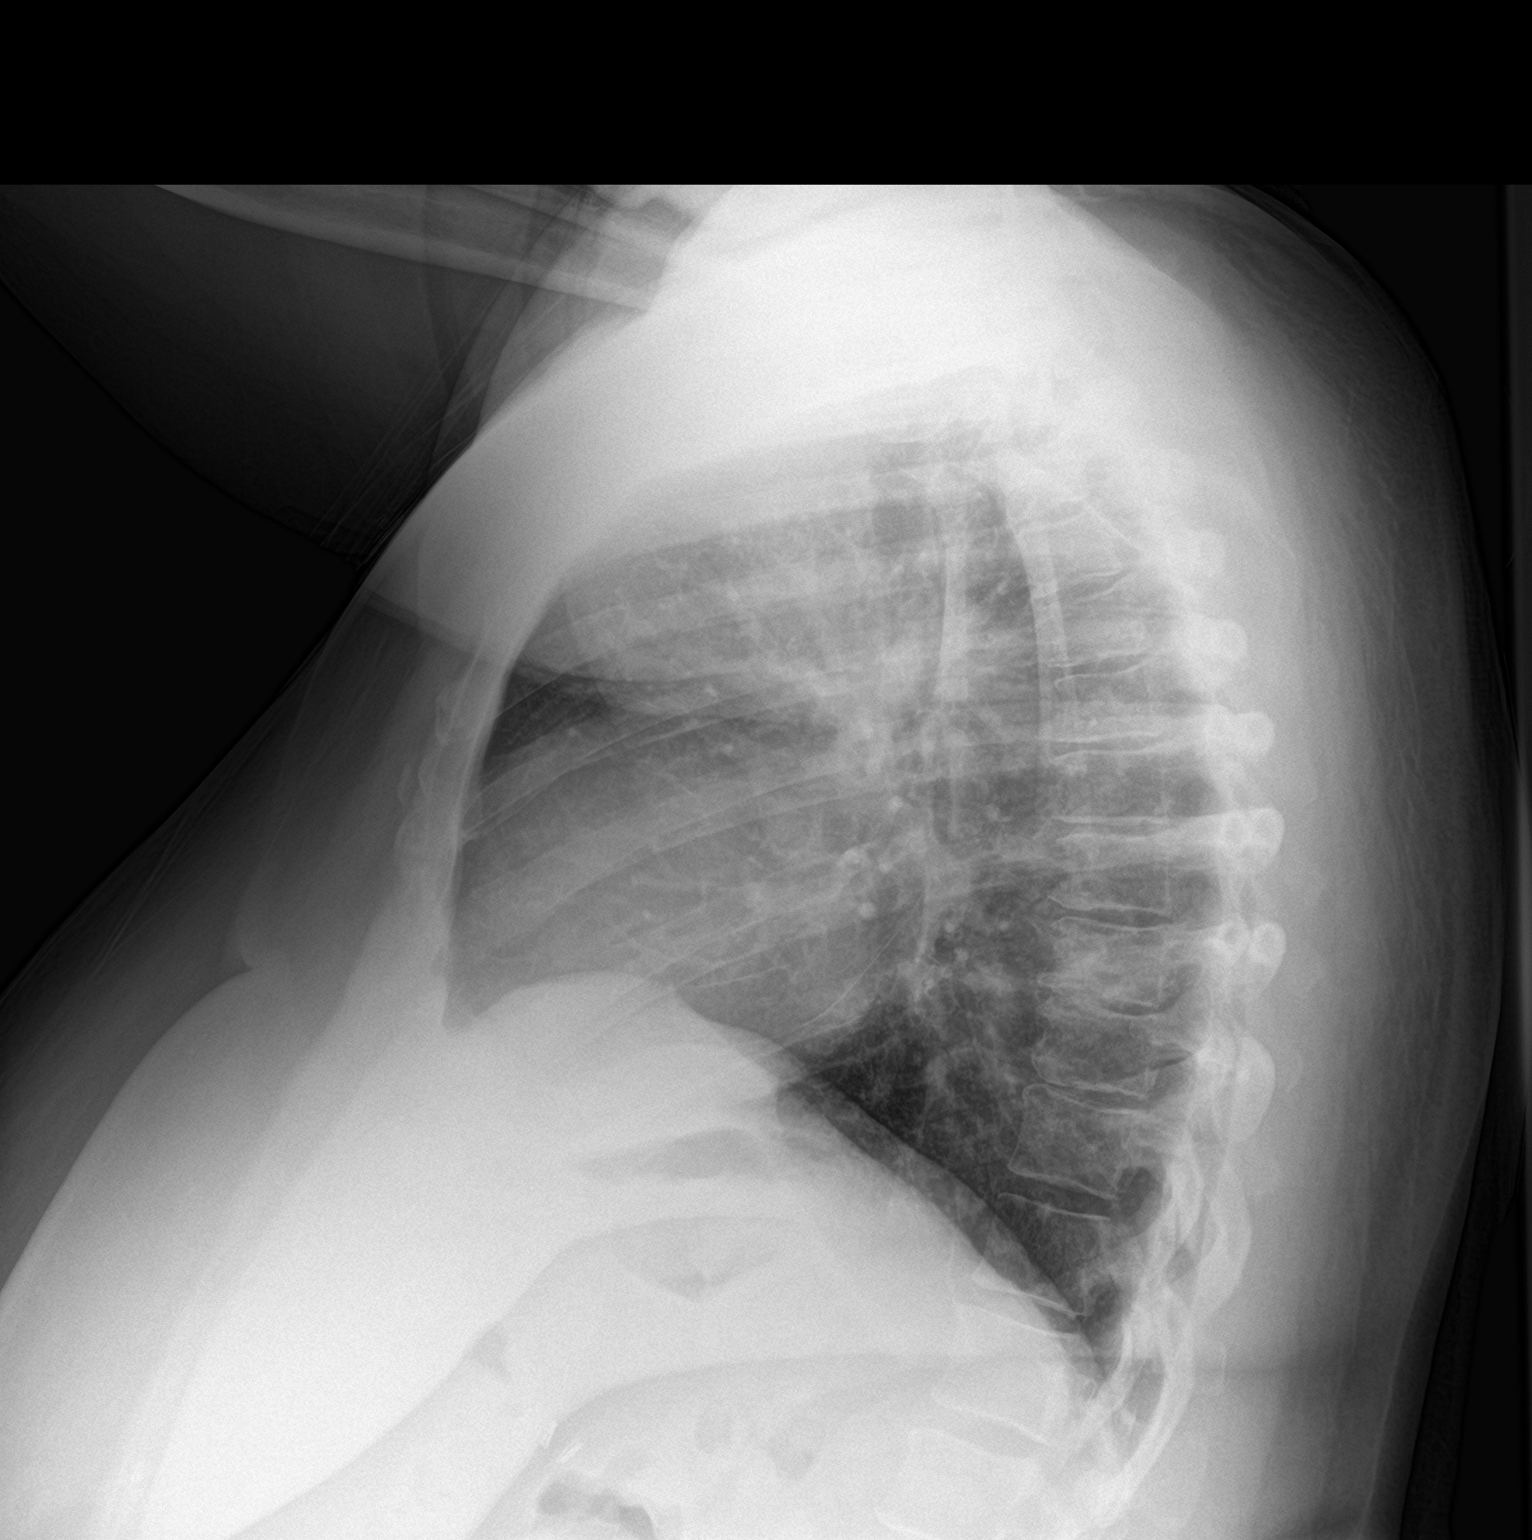

[2 of 2 positions shown; findings below may reference images not displayed]

FINDINGS: Heart and mediastinal contours are within normal limits. No focal
opacities or effusions. No acute bony abnormality.
IMPRESSION: No active cardiopulmonary disease.

## 2020-06-21 NOTE — Progress Notes (Signed)
   CC: Diabetes, ear pain, GAD  HPI:  [name] is a 55 y.o. female with history as below presenting for follow-up on the above. Please refer to problem based charting for further details of assessment and plan of current problem and chronic medical conditions.  Past Medical History:  Diagnosis Date  . Acute pain of left shoulder 06/07/2017  . Asthma    as a child  . Asthma 11/07/2005   Reported Diagnosis in 2012, no formal spirometry   . Cardiomyopathy ([place])    likely nonischemic DCM per Dr. Johnsie Cancel note 04/2017  . Carpal tunnel syndrome   . Chest pain 07/11/2018  . Chronic HFrEF (heart failure with reduced ejection fraction) ([place]) 02/22/2012  . Diabetes mellitus type 2 in obese ([place]) 11/17/2009   - Synjardy 05-998 BID, Januvia 100mg  Daily,  Basaglar - Failed to tolerate Victoza  . Diabetes mellitus type 2, uncontrolled ([facility]) DX: 1999   Started as gestational diabetes  . ESSENTIAL HYPERTENSION 07/08/2006  . Essential hypertension 07/08/2006  . 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 Gallstones   . HSV 11/07/2005   Qualifier: Diagnosis of  By: Pearline Cables MD, Belenda Cruise    . HSV (herpes simplex virus) anogenital infection   . HYPERLIPIDEMIA 08/20/2007  . Hyperlipidemia 08/20/2007   Atorvastatin 40mg  Daily Last Lipid panel may 2019: Total 138, HDL 31, LDL 89. ASCVD risk of 14.5%   . LBBB (left bundle branch block)   . Menorrhagia   . Migraine    Normal CT head (08/06/2006)  . Migraine without aura 09/02/2006   Fairly Well-controlled  2 per month  Takes Elitriptan   . 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 Perimenopausal menorrhagia 02/20/2012  . Psoriasis 04/29/2013  . Right low back pain 03/02/2014  . Routine adult health maintenance 09/27/2010   . Swelling of abdominal wall 03/02/2014  . Swelling of abdominal wall 03/02/2014  . Symptoms of upper respiratory infection (URI) 04/01/2018  . Viral URI with cough 01/13/2013   Review of Systems:   Review of Systems  Constitutional: Negative for chills and fever.  HENT: Positive for ear pain and tinnitus.   Neurological: Negative for dizziness.  Psychiatric/Behavioral: Negative for depression and suicidal ideas. The patient is not nervous/anxious.      Physical Exam: Vitals:   06/21/20 1405  BP: 137/84  Pulse: 81  Temp: 99.1 F (37.3 C)  TempSrc: Oral  SpO2: 100%  Weight: 206 lb (93.4 kg)  Height: 5' (1.524 m)   Constitutional: no acute distress Head: atraumatic ENT: external ears normal, bilateral ear canals and tympanic membranes are unremarkable, no cerumen impaction Pulmonary: effort normal Abdominal: flat Skin: warm and dry Neurological: alert, no focal deficit Psychiatric: normal mood and affect  Assessment & Plan:   See Encounters Tab for problem based charting.  Patient discussed with Dr. Philipp Ovens

## 2020-06-21 NOTE — Assessment & Plan Note (Signed)
Patient continues to have tinnitus and ear pain intermittently on the right side.  Feels that her tinnitus seems to have decreased, but ear pain still remains.  She had some serum buildup on exam last visit, but today there is no cerumen.  Ear canals unremarkable, tympanic membrane is intact.  Referral to ENT and audiometry was made at last visit, but she had not yet received phone call to schedule a visit.  -Patient given phone number to call and schedule her appointment, instructed to call us back if having any issues

## 2020-06-21 NOTE — Patient Instructions (Signed)
Thank you for allowing [name] to be a part of your care today, it was a pleasure seeing you. We discussed your diabetes and ear pain  INCREASE insulin Basaglar to 18 units for 1 week. Check your morning blood sugars and as long as they are not low (<70), increase to 20 units next week  We will call about your referral to ENT. Please call us back in 1 week if you have not heard from them  Please call [facility] at [phone] to schedule an eye doctor visit  Please follow up in 6 months   Thank you, and please call the [facility] at [phone] if you have any questions.  Best, Dr. Bridgett Larsson

## 2020-06-21 NOTE — Assessment & Plan Note (Addendum)
Lab Results  Component Value Date   HGBA1C 7.9 (A) 06/21/2020   HGBA1C 8.1 (A) 12/24/2019   HGBA1C 9.9 (A) 12/04/2018  At last visit increased insulin glargine from 10 to 16 units. Denies neuropathy or foot wounds.  -Increase Lantus to 18 units and then 20 units next week after as long as Sandra [name] does not have any low blood sugars - Patient will check fasting blood sugars

## 2020-06-21 NOTE — Assessment & Plan Note (Signed)
Occurred after COVID infection this winter. She responded well to lexapro and therapist, has since gone back to work and feels symptoms have since resolved . Is seeing a therapist on an as-needed basis. No more panic attacks.  Discussed with patient that we could discontinue the Lexapro at some point given this is her first episode, but she would rather stay on it.  -Continue Lexapro 10 mg.  Could discontinue as it has been about 6 months, but patient prefers to stay on this -Continue seeing therapist as needed

## 2020-06-21 NOTE — Assessment & Plan Note (Signed)
-   Patient reports benign mammogram at outside facility in April of this year - counseled on shingles vaccine, she will inquire at pharmacy when she is ready to take it - number given to make ophthalmology appointment

## 2020-06-25 NOTE — Progress Notes (Signed)
[facility] Clinic Attending  Case discussed with Dr. Chen  At the time of the visit.  We reviewed the resident's history and exam and pertinent patient test results.  I agree with the assessment, diagnosis, and plan of care documented in the resident's note. 

## 2020-06-29 ENCOUNTER — Ambulatory Visit (HOSPITAL_COMMUNITY): Payer: BC Managed Care – PPO | Admitting: Licensed Clinical Social Worker

## 2020-06-29 ENCOUNTER — Other Ambulatory Visit: Payer: Self-pay

## 2020-06-29 ENCOUNTER — Telehealth (HOSPITAL_COMMUNITY): Payer: Self-pay | Admitting: Licensed Clinical Social Worker

## 2020-06-29 NOTE — Telephone Encounter (Signed)
Sandra [name] had a virtual therapy session scheduled today at 3pm and presented on time for this appointment via [facility] application.  Sandra [name] was alert, oriented x5, with no evidence or self-report of active SI/HI or A/V H, but reported that she is currently out of town with family and could not ensure privacy during this session, so she requested to cancel.  Clinician was agreeable to this, and encouraged her to outreach front desk today about availability for future sessions.  She was agreeable to this, and promised to follow up in 2 weeks.  Clinician informed front desk of cancellation and will continue to monitor.      [name], LCSW, LCAS 06/29/20

## 2020-07-06 ENCOUNTER — Encounter: Payer: Self-pay | Admitting: *Deleted

## 2020-07-13 ENCOUNTER — Ambulatory Visit (INDEPENDENT_AMBULATORY_CARE_PROVIDER_SITE_OTHER): Payer: BC Managed Care – PPO | Admitting: Licensed Clinical Social Worker

## 2020-07-13 ENCOUNTER — Other Ambulatory Visit: Payer: Self-pay

## 2020-07-13 DIAGNOSIS — F3341 Major depressive disorder, recurrent, in partial remission: Secondary | ICD-10-CM

## 2020-07-13 DIAGNOSIS — F418 Other specified anxiety disorders: Secondary | ICD-10-CM | POA: Diagnosis not present

## 2020-07-13 NOTE — Progress Notes (Signed)
Virtual Visit via Video Note   I connected with Sandra [name] on 07/13/20 at 3:00pm by video enabled telemedicine application and verified that I am speaking with the correct person using two identifiers.   I discussed the limitations, risks, security and privacy concerns of performing an evaluation and management service by video and the availability of in person appointments. I also discussed with the patient that there may be a patient responsible charge related to this service. The patient expressed understanding and agreed to proceed.   I discussed the assessment and treatment plan with the patient. The patient was provided an opportunity to ask questions and all were answered. The patient agreed with the plan and demonstrated an understanding of the instructions.   The patient was advised to call back or seek an in-person evaluation if the symptoms worsen or if the condition fails to improve as anticipated.   I provided 30 minutes of non-face-to-face time during this encounter.     [name], LCSW, LCAS _______________________________ THERAPIST PROGRESS NOTE   Session Time: 3:00pm - 3:30pm   Location: Patient: Patient home Provider: [facility]   Participation Level: Active   Behavioral Response: Alert, casually dressed, euthymic mood/affect   Type of Therapy:  Individual Therapy   Treatment Goals addressed: Depression, panic attack, and anxiety management; Medication management; Socialization with support system; Employment   Interventions: CBT, nutritional and pain assessments, PHQ9 and GAD7, treatment planning    Summary: Sandra [name] is a 55 year old married African American female that presented for therapy session today and is diagnosed with Major Depressive Disorder, recurrent, in partial remission with anxious distress.        Suicidal/Homicidal: None; without intent or plan   Therapist Response: Clinician met with Sandra [name] for virtual therapy appointment  and assessed for safety, medication compliance, and sobriety.  Sandra [name] presented for today's session on time and was alert, oriented x5, with no evidence or self-report of active SI/HI or A/V H.  Sandra [name] reported ongoing compliance with medication and denied any use of alcohol or illicit substances.  Clinician inquired about Sandra [name]'s current emotional ratings, as well as any significant changes in thoughts, feelings, or behavior since last check-in.  Sandra [name] reported scores of 0/10 for depression, 0/10 for anxiety, and 0/10 for anger/irritability.  Sandra [name] denied experiencing any panic attacks or outbursts since last check-in.  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  She denied having any issues that needed to be addressed today.  Clinician recommended revisiting treatment plan with Sandra [name] today for updates due to length of time that has passed in treatment. Clinician collaborated with Sandra [name] to revise her treatment plan as follows with her verbal consent: Meet with clinician for therapy sessions x1 per month to address progress towards goals, as well as barriers to success;  Meet with PCP once every 3 months to address efficacy of medication and make adjustments as needed to regimen and/or dosage; Take medications daily as prescribed to reduce severity of noted symptoms and improve overall daily functioning; Reduce average anxiety from 1/10 in severity down to 0/10 and maintain panic episodes at 0 per month over next 90 days via utilization of 3-4 relaxation and/or grounding skills daily when related symptoms arise via techniques such as mindful breathing, progressive muscle relaxation, 5-4-3-2-1 grounding, etc; Maintain depression severity at average of 0/10 over next 90 days by engaging in 1  hour of positive self-care activities daily; Set aside at least 2 hours per week towards socializing with friends and  family in order to reduce compartmentalization of feelings, and isolation;  Commit to taking walks with sisters on break for exercise x3 per week for 15-20 minutes on average in order to improve both physical and mental health as well as maintain healthy body weight; Maintain use of sleep hygiene techniques in order to ensure 8 hours of rest nightly and reduce related fatigue/irritability upon waking; Attend church and bible study in person x2 per week to increase connection with higher power and spiritual community within next 90 days; Maintain 40 hour workweek at job in order to ensure predictable/productive routine, financial stability, and continue working to alleviate related anxiety in this setting; Voluntarily seek hospitalization should SI/HI appear and safety of self and/or others is determined to be at risk due to development of plan/intent to harm.  Clinician completed updated PHQ9, GAD7, and CSSRS screenings with Sandra [name].  She scored a 0 and 1 respectively, in addition to showing no risk of self-harm at this time.  Clinician revised Sandra [name]'s diagnosis based upon partial remission of depressive and anxiety symptoms over recent weeks, and discussed this decision with her.  Clinician also completed updated nutritional and pain assessments with Sandra [name] which revealed no issues at present. Progress is seen in Sandra [name]'s reduced severity of depression and anxiety, utilization of coping skills to manage panic attacks, consistency with PCP appointments and medication compliance, in addition to improved sleep schedule, and engagement with support network.  Sandra [name] reported that she would like to reduce frequency of therapy to once per month due to stability in mental health, and stated "Its good to check in every now and then so I can be honest with you and make sure I'm still keeping up with my goals".  Clinician was agreeable to modifying treatment schedule and will continue to monitor.      Plan: Meet again  in 1 month.  Diagnosis: Major Depressive Disorder, recurrent, in partial remission with anxious distress.   [name], LCSW, LCAS 07/13/20

## 2020-07-19 ENCOUNTER — Telehealth: Payer: Self-pay

## 2020-07-19 DIAGNOSIS — R0609 Other forms of dyspnea: Secondary | ICD-10-CM

## 2020-07-19 NOTE — Telephone Encounter (Signed)
We received a fax from [facility] regarding whether patient is still on Oxygen.I spoke to the patient today and she has not been on her oxygen in months and she is doing well. I will  send this message to the [facility] Team so they can put  order in to [place].

## 2020-07-19 NOTE — Telephone Encounter (Signed)
Done

## 2020-07-26 ENCOUNTER — Encounter: Payer: Self-pay | Admitting: *Deleted

## 2020-07-27 ENCOUNTER — Ambulatory Visit (HOSPITAL_COMMUNITY): Payer: BC Managed Care – PPO | Admitting: Licensed Clinical Social Worker

## 2020-08-10 ENCOUNTER — Other Ambulatory Visit: Payer: Self-pay

## 2020-08-10 ENCOUNTER — Ambulatory Visit (INDEPENDENT_AMBULATORY_CARE_PROVIDER_SITE_OTHER): Payer: BC Managed Care – PPO | Admitting: Licensed Clinical Social Worker

## 2020-08-10 DIAGNOSIS — F3341 Major depressive disorder, recurrent, in partial remission: Secondary | ICD-10-CM

## 2020-08-10 DIAGNOSIS — F418 Other specified anxiety disorders: Secondary | ICD-10-CM

## 2020-08-10 NOTE — Progress Notes (Signed)
Virtual Visit via Video Note   I connected with Sandra [name] on 08/10/20 at 3:00pm by video enabled telemedicine application and verified that I am speaking with the correct person using two identifiers.   I discussed the limitations, risks, security and privacy concerns of performing an evaluation and management service by video and the availability of in person appointments. I also discussed with the patient that there may be a patient responsible charge related to this service. The patient expressed understanding and agreed to proceed.   I discussed the assessment and treatment plan with the patient. The patient was provided an opportunity to ask questions and all were answered. The patient agreed with the plan and demonstrated an understanding of the instructions.   The patient was advised to call back or seek an in-person evaluation if the symptoms worsen or if the condition fails to improve as anticipated.   I provided 50 minutes of non-face-to-face time during this encounter.     [name], LCSW, LCAS _______________________________ THERAPIST PROGRESS NOTE   Session Time: 3:00pm - 3:50pm   Location: Patient: Patient home Provider: [facility]   Participation Level: Active   Behavioral Response: Alert, casually dressed, euthymic mood/affect   Type of Therapy:  Individual Therapy   Treatment Goals addressed: Depression, panic attack, and anxiety management; Medication management; Socialization with support system; Employment   Interventions: CBT: self-care assessment      Summary: Sandra [name] is a 55 year old married African American female that presented for therapy session today and is diagnosed with Major Depressive Disorder, recurrent, in partial remission with anxious distress.        Suicidal/Homicidal: None; without intent or plan   Therapist Response: Clinician met with Brandice for virtual therapy session and assessed for safety, medication  compliance, and sobriety.  Gilbert presented for today's appointment on time and was alert, oriented x5, with no evidence or self-report of active SI/HI or A/V H.  Ger reported that she continues taking medication as prescribed and denied any use of alcohol or illicit substances.  Clinician inquired about Kortny's emotional ratings today, as well as any significant changes in thoughts, feelings, or behavior since previous check-in.  Malin reported scores of 0/10 for depression, 0/10 for anxiety, and 0/10 for anger/irritability.  Leilanni denied experiencing any panic attacks or outbursts since last check-in.  Amparo reported that everything has been going well and she is preparing to go on a large family trip to the beach next month.  She reported that work has not been too stressful, and although management has changed recently, she is getting along with everyone.  Prisila denied having an agenda in mind today for therapy, so clinician provided her with options based upon treatment goals.  Joneisha reported that she would be interested in discussing self-care.  Clinician explained how self-care can be defined as the things one does to maintain good health and improve well-being.  Clinician virtually shared a self-care assessment form with Karsten via [employer] application to complete which featured various sub-categories of self-care, including physical, psychological/emotional, social, spiritual, and professional.  Parissa was asked to rank her engagement in the activities listed for each dimension on a scale of 1-3, with 1 indicating 'Poor', 2 indicating '[place]', and 3 indicating 'Well'.  Clinician invited Amillya to share results of this assessment, and inquired about which areas of self-care she is doing well in, as well as areas that require attention, and how she plans to begin addressing this during  treatment. Intervention was effective, as evidenced by [name] actively participating in assessment,  completing first 3 sections, and discussing results, noting that she is doing well in critical areas such as maintaining personal hygiene, eating healthy foods, taking time off work, getting regular sleep, and finding reasons to laugh each day, but would benefit from exercising more regularly, exploring new hobbies to engage in, meeting new people, outreaching friends/family that live far away and learning new things each day.  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  Jesly stated "Today I learned that self care is good for my mind, body, and soul. I need to work on exercise and socializing more often".  Clinician will continue to monitor.      Plan: Meet again in 1 month.  Diagnosis: Major Depressive Disorder, recurrent, in partial remission with anxious distress.   [name], LCSW, LCAS 08/10/20

## 2020-09-07 ENCOUNTER — Other Ambulatory Visit: Payer: Self-pay

## 2020-09-07 ENCOUNTER — Telehealth (HOSPITAL_COMMUNITY): Payer: Self-pay | Admitting: Licensed Clinical Social Worker

## 2020-09-07 ENCOUNTER — Ambulatory Visit (HOSPITAL_COMMUNITY): Payer: BC Managed Care – PPO | Admitting: Licensed Clinical Social Worker

## 2020-09-07 NOTE — Telephone Encounter (Signed)
Sandra [name] had a therapy appointment scheduled today at 3pm.  Clinician outreached her at 3pm, 3:08pm, and 3:15pm when she did not present on time for session, and left a voicemail reminder on 1st attempt.  Clinician informed front desk of no-show event when [name] did not return calls or present for session after 15 minutes.    [name], LCSW, LCAS 09/07/20

## 2020-09-11 ENCOUNTER — Other Ambulatory Visit: Payer: Self-pay | Admitting: Student

## 2020-09-11 DIAGNOSIS — E785 Hyperlipidemia, unspecified: Secondary | ICD-10-CM

## 2020-09-12 ENCOUNTER — Other Ambulatory Visit: Payer: Self-pay | Admitting: Student

## 2020-09-12 DIAGNOSIS — E785 Hyperlipidemia, unspecified: Secondary | ICD-10-CM

## 2020-09-12 MED ORDER — ATORVASTATIN CALCIUM 80 MG PO TABS: 80.0000 mg | ORAL_TABLET | Freq: Every day | ORAL | 3 refills | Status: DC

## 2020-09-22 ENCOUNTER — Other Ambulatory Visit: Payer: Self-pay | Admitting: Student

## 2020-09-22 DIAGNOSIS — K219 Gastro-esophageal reflux disease without esophagitis: Secondary | ICD-10-CM

## 2020-09-28 ENCOUNTER — Telehealth: Payer: Self-pay

## 2020-09-28 NOTE — Telephone Encounter (Signed)
Rx was sent to [facility] 8/22. I called [facility] - stated it was put on hold so he ran it thru again. Stated it will be ready for pt to pick up in 30 - 60 minutes. Called pt to let her know it was refilled on 8/22 and I had called the pharmacy and she can pick it up within the hour.

## 2020-09-28 NOTE — Telephone Encounter (Signed)
(  Second ) pt is requesting her atorvastatin (LIPITOR) 80 MG tablet be sent to [name] ([facility] [place] Phone:  [phone]  Fax:  [phone]     Pt has been out of medication for some weeks now

## 2020-10-12 ENCOUNTER — Ambulatory Visit (HOSPITAL_COMMUNITY): Payer: BC Managed Care – PPO | Admitting: Licensed Clinical Social Worker

## 2020-10-19 ENCOUNTER — Other Ambulatory Visit: Payer: Self-pay

## 2020-10-19 ENCOUNTER — Ambulatory Visit (INDEPENDENT_AMBULATORY_CARE_PROVIDER_SITE_OTHER): Payer: BC Managed Care – PPO | Admitting: Licensed Clinical Social Worker

## 2020-10-19 DIAGNOSIS — F3341 Major depressive disorder, recurrent, in partial remission: Secondary | ICD-10-CM | POA: Diagnosis not present

## 2020-10-19 DIAGNOSIS — F418 Other specified anxiety disorders: Secondary | ICD-10-CM | POA: Diagnosis not present

## 2020-10-19 NOTE — Progress Notes (Signed)
Virtual Visit via Video Note   I connected with Sandra [name] on 10/19/20 at 4:00pm by video enabled telemedicine application and verified that I am speaking with the correct person using two identifiers.   I discussed the limitations, risks, security and privacy concerns of performing an evaluation and management service by video and the availability of in person appointments. I also discussed with the patient that there may be a patient responsible charge related to this service. The patient expressed understanding and agreed to proceed.   I discussed the assessment and treatment plan with the patient. The patient was provided an opportunity to ask questions and all were answered. The patient agreed with the plan and demonstrated an understanding of the instructions.   The patient was advised to call back or seek an in-person evaluation if the symptoms worsen or if the condition fails to improve as anticipated.   I provided 30 minutes of non-face-to-face time during this encounter.     [name], LCSW, LCAS _______________________________ THERAPIST PROGRESS NOTE   Session Time: 4:00pm - 4:30pm   Location: Patient: Patient home Provider: [facility]   Participation Level: Active   Behavioral Response: Alert, casually dressed, euthymic mood/affect   Type of Therapy:  Individual Therapy   Treatment Goals addressed: Depression, panic attack, and anxiety management; Medication management   Interventions: CBT: self-care assessment p.2   Summary: Sandra [name] is a 55 year old married African American female that presented for therapy session today and is diagnosed with Major Depressive Disorder, recurrent, in partial remission with anxious distress.        Suicidal/Homicidal: None; without intent or plan   Therapist Response: Clinician met with Mickey for virtual therapy appointment and assessed for safety, medication compliance, and sobriety.  [name] presented for  today's session on time and was alert, oriented x5, with no evidence or self-report of active SI/HI or A/V H.  Krystie reported ongoing compliance with medication and denied any use of alcohol or illicit substances.  Clinician inquired about [name]'s current emotional ratings, as well as any significant changes in thoughts, feelings, or behavior since last check-in.  Elly reported scores of 0/10 for depression, 0/10 for anxiety, and 0/10 for anger/irritability.  Fraidy denied experiencing any panic attacks or outbursts since last check-in.  Chai reported that everything has been going well for her, as she has maintained work schedule, stable mental health, and self-care.  She denied having any issues or concerns to process today during therapy.  Clinician praised [name] for her consistent progress, and suggested continuing discussion upon topic of self-care today, which she was agreeable to.  Clinician virtually shared self-care assessment form with [name] via [employer] application to complete final sub-categories (i.e.spiritual, and professional).  [name] was asked to rank her engagement in the activities listed for each dimension on a scale of 1-3, with 1 indicating 'Poor', 2 indicating 'Ok', and 3 indicating 'Well'.  Clinician invited [name] to share results of this assessment, and inquired about which areas of self-care she is doing well in, as well as areas that require attention, and how she plans to begin addressing this during treatment.  Intervention was effective, as evidenced by [name] successfully completing final sections of assessment and actively engaging in discussion on subject, reporting that she is currently excelling in areas such as meditating and praying daily, recognizing the things that give meaning to her life, saying "No" to excessive responsibilities at work, taking on interesting work projects, and Medical illustrator  boundaries, but would benefit from consistently  acting in accordance with morals/values, participating in a cause that is important to her, spending more time in nature, and working on her professional skills.  [name] reported that she would improve in these areas by using her relaxation skills more often to control mood when triggered, become more involved in her church community since they have noble causes, planning for an RV trip in nature, and improving her event planning skills through participation in related courses.  [name] stated "This showed me that there is always room for improvement.  I'm learning as I go, and a lot of my stress depends on how I handle things each day".  Clinician will continue to monitor.      Plan: Meet again in 1 month.  Diagnosis: Major Depressive Disorder, recurrent, in partial remission with anxious distress.   [name], LCSW, LCAS 10/19/20

## 2020-11-06 ENCOUNTER — Other Ambulatory Visit: Payer: Self-pay | Admitting: Cardiovascular Disease

## 2020-11-21 ENCOUNTER — Other Ambulatory Visit: Payer: Self-pay | Admitting: Student

## 2020-11-21 DIAGNOSIS — E1169 Type 2 diabetes mellitus with other specified complication: Secondary | ICD-10-CM

## 2020-11-26 ENCOUNTER — Other Ambulatory Visit: Payer: Self-pay | Admitting: Cardiovascular Disease

## 2020-12-18 ENCOUNTER — Other Ambulatory Visit: Payer: Self-pay | Admitting: Cardiovascular Disease

## 2020-12-26 ENCOUNTER — Telehealth: Payer: Self-pay | Admitting: Cardiovascular Disease

## 2020-12-26 MED ORDER — METOPROLOL SUCCINATE ER 200 MG PO TB24: 200.0000 mg | ORAL_TABLET | Freq: Every day | ORAL | 0 refills | Status: DC

## 2020-12-26 NOTE — Telephone Encounter (Signed)
  *  STAT* If patient is at the pharmacy, call can be transferred to refill team.   1. Which medications need to be refilled? (please list name of each medication and dose if known)   metoprolol (TOPROL-XL) 200 MG 24 hr tablet    2. Which pharmacy/location (including street and city if local pharmacy) is medication to be sent to? [facility] ([facility]), [place] - [address]  3. Do they need a 30 day or 90 day supply? 90 days  Pt made an appt on 03/13/21

## 2020-12-26 NOTE — Telephone Encounter (Signed)
Pt's medication was sent to pt's pharmacy as requested. Confirmation received.  °

## 2021-01-02 ENCOUNTER — Other Ambulatory Visit: Payer: Self-pay | Admitting: Student

## 2021-01-02 ENCOUNTER — Other Ambulatory Visit: Payer: Self-pay | Admitting: Cardiovascular Disease

## 2021-01-02 DIAGNOSIS — E669 Obesity, unspecified: Secondary | ICD-10-CM

## 2021-01-02 DIAGNOSIS — E1169 Type 2 diabetes mellitus with other specified complication: Secondary | ICD-10-CM

## 2021-01-11 ENCOUNTER — Other Ambulatory Visit: Payer: Self-pay | Admitting: Cardiovascular Disease

## 2021-01-20 ENCOUNTER — Other Ambulatory Visit: Payer: Self-pay | Admitting: Student

## 2021-01-20 DIAGNOSIS — K219 Gastro-esophageal reflux disease without esophagitis: Secondary | ICD-10-CM

## 2021-01-30 ENCOUNTER — Other Ambulatory Visit: Payer: Self-pay

## 2021-01-30 DIAGNOSIS — E1169 Type 2 diabetes mellitus with other specified complication: Secondary | ICD-10-CM

## 2021-01-30 DIAGNOSIS — E669 Obesity, unspecified: Secondary | ICD-10-CM

## 2021-01-30 MED ORDER — SITAGLIPTIN PHOSPHATE 100 MG PO TABS: 100.0000 mg | ORAL_TABLET | Freq: Every day | ORAL | 0 refills | Status: DC

## 2021-02-01 ENCOUNTER — Other Ambulatory Visit: Payer: Self-pay | Admitting: Student

## 2021-02-01 DIAGNOSIS — E1169 Type 2 diabetes mellitus with other specified complication: Secondary | ICD-10-CM

## 2021-02-06 ENCOUNTER — Other Ambulatory Visit: Payer: Self-pay | Admitting: Internal Medicine

## 2021-02-06 ENCOUNTER — Other Ambulatory Visit: Payer: Self-pay | Admitting: Cardiovascular Disease

## 2021-02-06 DIAGNOSIS — E1169 Type 2 diabetes mellitus with other specified complication: Secondary | ICD-10-CM

## 2021-02-07 MED ORDER — SACUBITRIL-VALSARTAN 97-103 MG PO TABS: 1.0000 | ORAL_TABLET | Freq: Two times a day (BID) | ORAL | 1 refills | Status: DC

## 2021-02-08 NOTE — Telephone Encounter (Signed)
Next appt scheduled 02/23/21 with PCP.

## 2021-02-09 ENCOUNTER — Telehealth: Payer: Self-pay

## 2021-02-09 NOTE — Telephone Encounter (Signed)
Pa for pt Sandra [name] 100mg  tab)  came through on cover my meds .. but when I logged in I saw the  following message from the insurance plan....   [place] claim for Januvia 100MG  tablets, prescriber [facility], has been rejected and requires prior authorization for coverage.   Non-preferred medications may have a higher patient co-pay than the health insurance plan's preferred medications.   The following medications are recommended by the insurance plan.   The plan included these preferred products as part of the claim rejection message to the pharmacy.   TRADJENTA TAB 5MG     ( So if this is appropriate for pt to take you can go ahead and change the medication to the covered drug )

## 2021-02-23 ENCOUNTER — Ambulatory Visit (INDEPENDENT_AMBULATORY_CARE_PROVIDER_SITE_OTHER): Payer: BC Managed Care – PPO | Admitting: Internal Medicine

## 2021-02-23 ENCOUNTER — Encounter: Payer: Self-pay | Admitting: Internal Medicine

## 2021-02-23 VITALS — BP 127/67 | HR 79 | Temp 98.7°F | Wt 204.9 lb

## 2021-02-23 DIAGNOSIS — I110 Hypertensive heart disease with heart failure: Secondary | ICD-10-CM | POA: Diagnosis not present

## 2021-02-23 DIAGNOSIS — E1169 Type 2 diabetes mellitus with other specified complication: Secondary | ICD-10-CM | POA: Diagnosis not present

## 2021-02-23 DIAGNOSIS — E785 Hyperlipidemia, unspecified: Secondary | ICD-10-CM | POA: Diagnosis not present

## 2021-02-23 DIAGNOSIS — R0609 Other forms of dyspnea: Secondary | ICD-10-CM

## 2021-02-23 DIAGNOSIS — I5022 Chronic systolic (congestive) heart failure: Secondary | ICD-10-CM | POA: Diagnosis not present

## 2021-02-23 DIAGNOSIS — E669 Obesity, unspecified: Secondary | ICD-10-CM

## 2021-02-23 DIAGNOSIS — F411 Generalized anxiety disorder: Secondary | ICD-10-CM

## 2021-02-23 DIAGNOSIS — I10 Essential (primary) hypertension: Secondary | ICD-10-CM

## 2021-02-23 LAB — POCT GLYCOSYLATED HEMOGLOBIN (HGB A1C): Hemoglobin A1C: 9.3 % — AB (ref 4.0–5.6)

## 2021-02-23 LAB — GLUCOSE, CAPILLARY: Glucose-Capillary: 228 mg/dL — ABNORMAL HIGH (ref 70–99)

## 2021-02-23 MED ORDER — ASPIRIN EC 81 MG PO TBEC: 81.0000 mg | DELAYED_RELEASE_TABLET | Freq: Every day | ORAL | 9 refills | Status: DC

## 2021-02-23 MED ORDER — ALBUTEROL SULFATE HFA 108 (90 BASE) MCG/ACT IN AERS: 2.0000 | INHALATION_SPRAY | Freq: Four times a day (QID) | RESPIRATORY_TRACT | 6 refills | Status: DC | PRN

## 2021-02-23 MED ORDER — SPIRONOLACTONE 25 MG PO TABS: 25.0000 mg | ORAL_TABLET | Freq: Every day | ORAL | 0 refills | Status: DC

## 2021-02-23 MED ORDER — ESCITALOPRAM OXALATE 10 MG PO TABS: 10.0000 mg | ORAL_TABLET | Freq: Every day | ORAL | 3 refills | Status: DC

## 2021-02-23 MED ORDER — ATORVASTATIN CALCIUM 80 MG PO TABS: 80.0000 mg | ORAL_TABLET | Freq: Every day | ORAL | 3 refills | Status: DC

## 2021-02-23 MED ORDER — METOPROLOL SUCCINATE ER 200 MG PO TB24: 200.0000 mg | ORAL_TABLET | Freq: Every day | ORAL | 0 refills | Status: DC

## 2021-02-23 MED ORDER — LINAGLIPTIN 5 MG PO TABS: 5.0000 mg | ORAL_TABLET | Freq: Every day | ORAL | 3 refills | Status: DC

## 2021-02-23 MED ORDER — SACUBITRIL-VALSARTAN 97-103 MG PO TABS: 1.0000 | ORAL_TABLET | Freq: Two times a day (BID) | ORAL | 1 refills | Status: DC

## 2021-02-23 MED ORDER — BASAGLAR KWIKPEN 100 UNIT/ML ~~LOC~~ SOPN: 23.0000 [IU] | PEN_INJECTOR | Freq: Every day | SUBCUTANEOUS | 0 refills | Status: DC

## 2021-02-23 MED ORDER — SYNJARDY XR 5-1000 MG PO TB24: ORAL_TABLET | ORAL | 0 refills | Status: DC

## 2021-02-23 NOTE — Patient Instructions (Signed)
Thank you, [name] for allowing [name] to provide your care today. It was a pleasure to meet you! Today we discussed:  Diabetes: Your A1c increased to 9.3 (was 7.9). We will start Tradjenta 5 mg daily (in place of Januvia) and also, please increase your basaglar to 23 units every night. Please also be sure to check your blood sugar first thing in the morning and write these numbers down! When you come back in 2 weeks, we will review this and make adjustments to your medicine as needed.  We will work together to get your A1c back down.  High blood pressure/heart failure: Refilled your medications and no changes were made. Follow up with cardiology on 2/20. We are rechecking your kidney function and electrolytes today  High cholesterol: Continue your statin. We will recheck your cholesterol levels today  I have ordered the following labs for you:   Lab Orders         BMP8+Anion Gap         Lipid Profile         Glucose, capillary         POC Hbg A1C      Tests ordered today:  none  Referrals ordered today:    Referral Orders         Ambulatory referral to Ophthalmology      I have ordered the following medication/changed the following medications:   Stop the following medications: Medications Discontinued During This Encounter  Medication Reason   sitaGLIPtin (JANUVIA) 100 MG tablet Not covered by the pt's insurance   aspirin EC 81 MG tablet Reorder   albuterol (VENTOLIN HFA) 108 (90 Base) MCG/ACT inhaler Reorder   escitalopram (LEXAPRO) 10 MG tablet Reorder   atorvastatin (LIPITOR) 80 MG tablet Reorder   metoprolol (TOPROL-XL) 200 MG 24 hr tablet Reorder   spironolactone (ALDACTONE) 25 MG tablet Reorder   Insulin Glargine (BASAGLAR KWIKPEN) 100 UNIT/ML Reorder   SYNJARDY XR 05-998 MG TB24 Reorder   sacubitril-valsartan (ENTRESTO) 97-103 MG Reorder     Start the following medications: Meds ordered this encounter  Medications   Empagliflozin-metFORMIN HCl ER  (SYNJARDY XR) 05-998 MG TB24    Sig: TAKE 1  BY MOUTH TWICE DAILY WITH A MEAL    Dispense:  60 tablet    Refill:  0   sacubitril-valsartan (ENTRESTO) 97-103 MG    Sig: Take 1 tablet by mouth 2 (two) times daily.    Dispense:  60 tablet    Refill:  1    Please keep upcoming appt in February 2023 with Dr. Johnsie Cancel before anymore refills. Thank you   spironolactone (ALDACTONE) 25 MG tablet    Sig: Take 1 tablet (25 mg total) by mouth daily. Please keep upcoming appointment in February 2023 for future refills. Thank you    Dispense:  90 tablet    Refill:  0   metoprolol (TOPROL-XL) 200 MG 24 hr tablet    Sig: Take 1 tablet (200 mg total) by mouth daily. Please keep upcoming appt with Dr. Johnsie Cancel in February 2023 before anymore refills. Thank you    Dispense:  90 tablet    Refill:  0    Pt must keep upcoming appt with Dr. Johnsie Cancel in February 2023 before anymore refills. Thank you   linagliptin (TRADJENTA) 5 MG TABS tablet    Sig: Take 1 tablet (5 mg total) by mouth daily.    Dispense:  30 tablet    Refill:  3   atorvastatin (  LIPITOR) 80 MG tablet    Sig: Take 1 tablet (80 mg total) by mouth daily.    Dispense:  90 tablet    Refill:  3   aspirin EC 81 MG tablet    Sig: Take 1 tablet (81 mg total) by mouth daily. Swallow whole.    Dispense:  30 tablet    Refill:  9   escitalopram (LEXAPRO) 10 MG tablet    Sig: Take 1 tablet (10 mg total) by mouth daily.    Dispense:  90 tablet    Refill:  3   albuterol (VENTOLIN HFA) 108 (90 Base) MCG/ACT inhaler    Sig: Inhale 2 puffs into the lungs every 6 (six) hours as needed for wheezing or shortness of breath.    Dispense:  8 g    Refill:  6    Please provide pt with preferred inhaler covered by pt's insurance   Insulin Glargine (BASAGLAR KWIKPEN) 100 UNIT/ML    Sig: Inject 23 Units into the skin daily.    Dispense:  15 mL    Refill:  0     Follow up:  2 weeks for diabetes check     Remember: To increase your insulin to 23 units nightly and  to check your blood sugars every morning  Should you have any questions or concerns please call the internal medicine clinic at [phone].     Buddy Duty, D.O. [place]

## 2021-02-23 NOTE — Progress Notes (Signed)
° °  CC: med refill and diabetes follow up  HPI:  [name] is a 56 y.o. patient with hypertension, HFrEF (last EF 40%), T2DM, and HLD who presents to the [facility] for follow up of her diabetes and medication refills. Please see problem-based list for further details, assessments, and plans.   Past Medical History:  Diagnosis Date   Acute pain of left shoulder 06/07/2017   Asthma    as a child   Asthma 11/07/2005   Reported Diagnosis in 2012, no formal spirometry    Cardiomyopathy ([place])    likely nonischemic DCM per Dr. 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  HENT: Negative.    Eyes:  Negative for blurred vision and double vision.  Respiratory:  Negative for cough and shortness of breath.   Cardiovascular:  Negative for chest pain, palpitations, orthopnea and leg swelling.  Gastrointestinal:  Negative for diarrhea, nausea and vomiting.  Genitourinary:  Negative for dysuria and frequency.  Musculoskeletal: Negative.   Neurological:  Negative for dizziness and headaches.  Psychiatric/Behavioral: Negative.      Physical Exam:  Vitals:   02/23/21 1410  BP: 127/67  Pulse: 79  Temp: 98.7 F (37.1 C)  TempSrc: Oral  SpO2: 99%  Weight: 204 lb 14.4 oz (92.9 kg)   General: Pleasant, well-appearing female laying in bed. No acute distress. CV: RRR. No murmurs. No LE edema Pulmonary: Lungs CTAB. Normal effort.  Abdominal: Soft, nontender, nondistended. Extremities: Palpable radial and DP pulses. Normal ROM. Skin: Warm and dry.  Neuro: A&Ox3. No focal deficit. Psych: Normal mood and affect   Assessment & Plan:   See Encounters Tab for problem based charting.  Patient discussed with Dr. Daryll Drown

## 2021-02-23 NOTE — Assessment & Plan Note (Signed)
Patient has been taking lipitor 80 mg daily and having no side effects at this time. Will recheck lipid profile today.

## 2021-02-23 NOTE — Assessment & Plan Note (Signed)
A1c increased from 7.9 to 9.3 today. Patient notes her diet has not been the best, especially around the holidays. She endorses eating a lot of sweets and carbs (specifically pasta). She has also been out of her [place] for 1 month and has only been taking her basaglar 18u and synjardy 05-998 mg bid. She is asymptomatic at this time, and reports she does not check her sugars daily. She will check them on occasion in the morning, with sugars in the 200-270s typically. Diabetic foot exam performed today.   Plan: - Increase basaglar from 18u to 23u daily - Start tradjenta 5 mg daily (januvia no longer covered by insurance) - Continue synjardy 05-998 mg bid - Recheck sugar in 2 weeks (bring log of AM fasting sugars) - Referral to ophtho

## 2021-02-23 NOTE — Assessment & Plan Note (Signed)
Anxiety first began during the [place] pandemic. Patient has been on lexapro 10 mg since this time and reports that her symptoms have since resolved. She denies any panic attacks and overall she feels well. She would like to continue with her lexapro.  Plan: - Continue lexapro 10 mg - Continue seeing therapist as needed

## 2021-02-23 NOTE — Assessment & Plan Note (Signed)
BP Readings from Last 3 Encounters:  02/23/21 127/67  06/21/20 137/84  06/01/20 (!) 144/71   BP remains well controlled. Patient denies cp, palpitations, dizziness, LOC, headaches.   Plan: - Continue toprol 200 mg daily - Continue entresto 200 mg bid - Continue spiro 25 mg daily - BMP today

## 2021-02-23 NOTE — Assessment & Plan Note (Signed)
HFrEF is secondary to nonischemic dilated cardiomyopathy. Last echo in 2021 with EF of 40%. Planning to see cardiology on 2/20. Patient denies chest pain, leg swelling, DOE, or orthopnea.  Plan: - Continue toprol 200 mg - Continue entresto 200 mg bid - Continue spiro 25 mg daily - Cardiology f/u this month

## 2021-02-24 LAB — BMP8+ANION GAP
Anion Gap: 17 mmol/L (ref 10.0–18.0)
BUN/Creatinine Ratio: 14 (ref 9–23)
BUN: 12 mg/dL (ref 6–24)
CO2: 22 mmol/L (ref 20–29)
Calcium: 9.3 mg/dL (ref 8.7–10.2)
Chloride: 102 mmol/L (ref 96–106)
Creatinine, Ser: 0.83 mg/dL (ref 0.57–1.00)
Glucose: 207 mg/dL — ABNORMAL HIGH (ref 70–99)
Potassium: 4 mmol/L (ref 3.5–5.2)
Sodium: 141 mmol/L (ref 134–144)
eGFR: 84 mL/min/{1.73_m2}

## 2021-02-24 LAB — LIPID PANEL
Chol/HDL Ratio: 4.3 ratio (ref 0.0–4.4)
Cholesterol, Total: 129 mg/dL (ref 100–199)
HDL: 30 mg/dL — ABNORMAL LOW
LDL Chol Calc (NIH): 81 mg/dL (ref 0–99)
Triglycerides: 91 mg/dL (ref 0–149)
VLDL Cholesterol Cal: 18 mg/dL (ref 5–40)

## 2021-02-24 NOTE — Progress Notes (Signed)
Internal Medicine Clinic Attending ° °Case discussed with Dr. [facility]  at the time of the visit.  We reviewed the resident’s history and exam and pertinent patient test results.  I agree with the assessment, diagnosis, and plan of care documented in the resident’s note.  °

## 2021-03-01 ENCOUNTER — Telehealth: Payer: Self-pay

## 2021-03-01 NOTE — Telephone Encounter (Signed)
Patient will likely need in person evaluation for medication adjustment

## 2021-03-01 NOTE — Telephone Encounter (Signed)
RTC call to patient .  No answer. Message was left that the Clinics had returned her call and asked if she would return the clinics call.

## 2021-03-01 NOTE — Telephone Encounter (Signed)
Pt calling back to state the following medication is causing nausea  and vomiting.   Insulin Glargine (BASAGLAR KWIKPEN) 100 UNIT/ML      Sig: Inject 23 Units into the skin daily.      Dispense:  15 mL

## 2021-03-01 NOTE — Telephone Encounter (Signed)
PA  for pt ( ALBUTEROL SULFATE HFA 108(90BAS )  MCG/ACT AEROSOL )  came through on cover my meds was submitted .. awaiting approval or denial

## 2021-03-01 NOTE — Telephone Encounter (Signed)
DECISION :   Denied today    Your PA request has been denied. Additional information will be provided in the denial communication.    Coverage for this medication is denied for the following reason(s). We reviewed the information we received about your condition and circumstances. We used plan approved criteria when making this decision. The policy states that this medication may be approved when:- The member is unable to take the required number of formulary alternatives for the given diagnosis due to an intolerance or contraindication OR - The member has tried and failed the required number of formulary alternatives.      ( COPY PLACED TO BE SCANNED TO CHART )

## 2021-03-01 NOTE — Telephone Encounter (Signed)
Spoke to patient is having Nausea and vomiting since starting the [place].  States is continuing to take medication as directed.  Patient took last dose last night. Ptient to hold until she hears from the office.

## 2021-03-01 NOTE — Telephone Encounter (Signed)
Requesting to speak with a nurse about side effects on diabetes medication. Pt do not know the name of the medication. Please call back.

## 2021-03-03 ENCOUNTER — Telehealth: Payer: Self-pay

## 2021-03-03 NOTE — Telephone Encounter (Signed)
Please see phone note from 2/8. Patient has an appt with PCP on 2/16.

## 2021-03-03 NOTE — Telephone Encounter (Signed)
Requesting nausea medication, please call pt back.

## 2021-03-08 NOTE — Progress Notes (Signed)
Evaluation Performed:  Follow-up visit  Date:  03/13/2021   ID:  Adhira, Jamil 31-May-1965, MRN [id number]  Provider Location: Office  PCP:  Dorethea Clan, DO  Cardiologist:  Johnsie Cancel Electrophysiologist:  None   Chief Complaint:   Dyspnea  History of Present Illness:    Sandra [name] is a 56 y.o. female with DM, HLD, HTN first seen 04/26/17 for dyspnea. Also had atypical chest pain with LBBB on ECG ER visit r/o normal CXR  History of NIDCM 2014 EF 45% Echo 05/09/17 same 40-45% no significant valve disease or pulmonary hypertension Myovue 05/20/17 normal  Estimated EF 38%  Done well on EntrestoDiuretic/beta blocker / statin and ASA She sees behavioral health for anxiety / depression Started on low dose Lexapro 11/11/19    54  yo mom living with her Son turns 24 working at [employer] Discussed compliance with meds as she misses doses on occasion  Functional class one TTE 10/22/19 with EF 40% stable   COVID positive August 2021 CT 10/08/19 reviewed IMPRESSION: 1. No evidence of acute pulmonary embolism. 2. Improvement in bibasilar predominant airspace consolidative opacities. 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  Huge [employer] fan. Compliant with meds Functional class one    Past Medical History:  Diagnosis Date   Acute pain of left shoulder 06/07/2017   Asthma    as a child   Asthma 11/07/2005   Reported Diagnosis in 2012, no formal spirometry    Cardiomyopathy ([place])    likely nonischemic DCM per Dr. 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   Perimenopausal menorrhagia 02/20/2012   Psoriasis 04/29/2013   Right low back pain 03/02/2014   Routine adult health maintenance 09/27/2010   Swelling of abdominal wall 03/02/2014   Swelling of abdominal wall 03/02/2014   Symptoms of upper respiratory infection (URI) 04/01/2018   Viral URI with cough 01/13/2013   Past Surgical History:  Procedure Laterality Date   CHOLECYSTECTOMY  2009   DILATATION & CURETTAGE/HYSTEROSCOPY WITH MYOSURE N/A 12/25/2017   Procedure: DILATATION & CURETTAGE/HYSTEROSCOPY   POLYPECTOMY WITH MYOSURE;  Surgeon: Charyl Bigger, MD;  Location: [facility];  Service: Gynecology;  Laterality: N/A;   TUBAL LIGATION       No outpatient medications have  been marked as taking for the 03/13/21 encounter (Office Visit) with Josue Hector, MD.     Allergies:   Drug class Lourena Simmonds and nefazodone] and Sertraline hcl   Social History   Tobacco Use   Smoking status: Never   Smokeless tobacco: Never  Vaping Use   Vaping Use: Never used  Substance Use Topics    Alcohol use: Yes    Alcohol/week: 0.0 standard drinks    Comment: occ   Drug use: No     Family Hx: The patient's family history includes Diabetes in her father; Hypertension in her father.  ROS:   Please see the history of present illness.     All other systems reviewed and are negative.   Prior CV studies:   The following studies were reviewed today:  CXR 12/12/17 TTE 05/09/17 Myovue 05/20/17 TTE 10/22/19   Labs/Other Tests and Data Reviewed:    EKG:   SR IVCD rate 96 03/19/17  03/13/2021 NSR rate 76 LBBB  Recent Labs: 02/23/2021: BUN 12; Creatinine, Ser 0.83; Potassium 4.0; Sodium 141   Recent Lipid Panel Lab Results  Component Value Date/Time   CHOL 129 02/23/2021 02:45 PM   TRIG 91 02/23/2021 02:45 PM   HDL 30 (L) 02/23/2021 02:45 PM   CHOLHDL 4.3 02/23/2021 02:45 PM   CHOLHDL 3.9 07/11/2018 04:30 AM   LDLCALC 81 02/23/2021 02:45 PM    Wt Readings from Last 3 Encounters:  03/13/21 200 lb (90.7 kg)  03/09/21 201 lb 8 oz (91.4 kg)  02/23/21 204 lb 14.4 oz (92.9 kg)     Objective:    Vital Signs:  Ht 5' (1.524 m)    Wt 200 lb (90.7 kg)    BMI 39.06 kg/m    Affect appropriate Healthy:  appears stated age 17: normal Neck supple with no adenopathy JVP normal no bruits no thyromegaly Lungs clear with no wheezing and good diaphragmatic motion Heart:  S1/S2 no murmur, no rub, gallop or click PMI normal Abdomen: benighn, BS positve, no tenderness, no AAA no bruit.  No HSM or HJR Distal pulses intact with no bruits No edema Neuro non-focal Skin warm and dry No muscular weakness   ASSESSMENT & PLAN:    DCM:  Stable on medical Rx functional class one TTE 10/22/19 with EF 40% improved Continue Toprol, entresto and aldactone  Update echo for EF  HLD continue statin labs with primary DM:  Discussed low carb diet.  Target hemoglobin A1c is 6.5 or less.  Continue current medications. Anxiety/Depression:  On Lexapro f/u with psychiatry    Time:   Today, I  have spent 30 minutes with the patient    Medication Adjustments/Labs and Tests Ordered: Current medicines are reviewed at length with the patient today.  Concerns regarding medicines are outlined above.   Tests Ordered:  Echo for DCM   Medication Changes: No orders of the defined types were placed in this encounter.   Disposition:  Follow up  in a year     Signed, Jenkins Rouge, MD  03/13/2021 8:37 AM    [place]

## 2021-03-09 ENCOUNTER — Ambulatory Visit: Payer: BC Managed Care – PPO | Admitting: Internal Medicine

## 2021-03-09 ENCOUNTER — Encounter: Payer: Self-pay | Admitting: Internal Medicine

## 2021-03-09 DIAGNOSIS — E669 Obesity, unspecified: Secondary | ICD-10-CM | POA: Diagnosis not present

## 2021-03-09 DIAGNOSIS — E1169 Type 2 diabetes mellitus with other specified complication: Secondary | ICD-10-CM

## 2021-03-09 MED ORDER — TRULICITY 0.75 MG/0.5ML ~~LOC~~ SOAJ: SUBCUTANEOUS | 0 refills | Status: DC

## 2021-03-09 NOTE — Patient Instructions (Signed)
Thank you, Sandra [name] for allowing [name] to provide your care today. Today we discussed:  Diabetes: Your fasting (morning) sugars are looking a lot better already! Keep checking your morning sugar level and write them down, so you can bring this to the next visit. STOP taking Tradjenta (like you already had) and continue using 23 units of your insulin each night. We are also stating you on a new medication called Trulicity, which will be a weekly injection. Inject 0.75 mg once a week for 4 weeks, and then increase the dose to 1.5 mg per week. Follow up in 2 months for an A1c recheck and hopefully you will continue to do well!   I have ordered the following labs for you:  Lab Orders  No laboratory test(s) ordered today     Tests ordered today:  none  Referrals ordered today:   Referral Orders  No referral(s) requested today     I have ordered the following medication/changed the following medications:   Stop the following medications: Medications Discontinued During This Encounter  Medication Reason   linagliptin (TRADJENTA) 5 MG TABS tablet Change in therapy     Start the following medications: Meds ordered this encounter  Medications   Dulaglutide (TRULICITY) 5.00 BB/0.4UG SOPN    Sig: Inject 0.75 mg into the skin once a week for 30 days, THEN 1.5 mg once a week.    Dispense:  6 mL    Refill:  0     Follow up: 2 months    Remember: To start the Trulicity injection and do this each week. Keep checking your morning sugars  Should you have any questions or concerns please call the internal medicine clinic at [phone].     Buddy Duty, D.O. [place]

## 2021-03-09 NOTE — Assessment & Plan Note (Addendum)
A1c increased from 7.9 to 9.3 at her last office visit 2 weeks ago. At that time, the patient was advised to increase her Basaglar from 18u qhs to 23u qhs, which she has been doing. She was also started on Tradjenta 5 mg (januvia was no longer covered by her insurance), however, she stopped taking this after a few days due to nausea. Patient also continued on her Synjardy 05-998 mg bid. Since her last visit, her AM sugars have improved from the 200-270s to 130-230 (most <200). She feels well overall today. Denies any sugar readings of <100 or symptomatic hypoglycemia.  Plan: - Continue basaglar 23u qhs - STOP tradjenta - Start Trulicity 5.14 mg/weekly x 4 weeks, followed by 1.5 mg per week - Continue Synjardy 05-998 mg bid - Referral to ophtho placed at last visit, awaiting call from their office - Follow up in ~2 months for A1c recheck

## 2021-03-09 NOTE — Progress Notes (Signed)
° °  CC: 2 week diabetes follow up  HPI:  [name] is a 56 y.o. female with diabetes, GAD, HFrEF, and HTN who presents to the [facility] for a 2 week diabetes follow up. Please see problem-based list for further details, assessments, and plans.   Past Medical History:  Diagnosis Date   Acute pain of left shoulder 06/07/2017   Asthma    as a child   Asthma 11/07/2005   Reported Diagnosis in 2012, no formal spirometry    Cardiomyopathy ([place])    likely nonischemic DCM per Dr. 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 No acute distress. CV: RRR. No murmurs. No LE edema Pulmonary: Lungs CTAB. Normal effort.  Abdominal: Soft, nontender, nondistended. Normal bowel sounds. Skin: Warm and dry.  Neuro: A&Ox3. No focal deficit. Psych: Normal mood and affect   Assessment & Plan:   See Encounters Tab for problem based charting.  Patient discussed with Dr.  Cain Sieve

## 2021-03-11 NOTE — Progress Notes (Signed)
Internal Medicine Clinic Attending ° °Case discussed with Dr. Atway  At the time of the visit.  We reviewed the resident’s history and exam and pertinent patient test results.  I agree with the assessment, diagnosis, and plan of care documented in the resident’s note.  °

## 2021-03-13 ENCOUNTER — Other Ambulatory Visit: Payer: Self-pay

## 2021-03-13 ENCOUNTER — Ambulatory Visit: Payer: BC Managed Care – PPO | Admitting: Cardiovascular Disease

## 2021-03-13 ENCOUNTER — Encounter: Payer: Self-pay | Admitting: Cardiovascular Disease

## 2021-03-13 VITALS — BP 112/62 | HR 76 | Ht 60.0 in | Wt 200.0 lb

## 2021-03-13 DIAGNOSIS — I420 Dilated cardiomyopathy: Secondary | ICD-10-CM | POA: Diagnosis not present

## 2021-03-13 DIAGNOSIS — I10 Essential (primary) hypertension: Secondary | ICD-10-CM | POA: Diagnosis not present

## 2021-03-13 NOTE — Patient Instructions (Signed)
Medication Instructions:  ?Your physician recommends that you continue on your current medications as directed. Please refer to the Current Medication list given to you today. ? ?*If you need a refill on your cardiac medications before your next appointment, please call your pharmacy* ? ?Lab Work: ?If you have labs (blood work) drawn today and your tests are completely normal, you will receive your results only by: ?MyChart Message (if you have MyChart) OR ?A paper copy in the mail ?If you have any lab test that is abnormal or we need to change your treatment, we will call you to review the results. ? ?Testing/Procedures: ?Your physician has requested that you have an echocardiogram. Echocardiography is a painless test that uses sound waves to create images of your heart. It provides your doctor with information about the size and shape of your heart and how well your heart?s chambers and valves are working. This procedure takes approximately one hour. There are no restrictions for this procedure. ? ?Follow-Up: ?At [facility], you and your health needs are our priority.  As part of our continuing mission to provide you with exceptional heart care, we have created designated Provider Care Teams.  These Care Teams include your primary Cardiologist (physician) and Advanced Practice Providers (APPs -  Physician Assistants and Nurse Practitioners) who all work together to provide you with the care you need, when you need it. ? ?We recommend signing up for the patient portal called "MyChart".  Sign up information is provided on this After Visit Summary.  MyChart is used to connect with patients for Virtual Visits (Telemedicine).  Patients are able to view lab/test results, encounter notes, upcoming appointments, etc.  Non-urgent messages can be sent to your provider as well.   ?To learn more about what you can do with MyChart, go to [email or web address].   ? ?Your next appointment:   ?1 year(s) ? ?The format for  your next appointment:   ?In Person ? ?Provider:   ?Peter Nishan, MD { ? ?

## 2021-03-22 ENCOUNTER — Ambulatory Visit (HOSPITAL_COMMUNITY): Payer: BC Managed Care – PPO | Attending: Cardiology

## 2021-03-22 ENCOUNTER — Other Ambulatory Visit: Payer: Self-pay

## 2021-03-22 DIAGNOSIS — I10 Essential (primary) hypertension: Secondary | ICD-10-CM | POA: Insufficient documentation

## 2021-03-22 DIAGNOSIS — I420 Dilated cardiomyopathy: Secondary | ICD-10-CM | POA: Diagnosis not present

## 2021-03-22 LAB — ECHOCARDIOGRAM COMPLETE
Area-P 1/2: 2.86 cm2
S' Lateral: 3.6 cm

## 2021-04-12 ENCOUNTER — Other Ambulatory Visit: Payer: Self-pay | Admitting: Internal Medicine

## 2021-04-12 DIAGNOSIS — E1169 Type 2 diabetes mellitus with other specified complication: Secondary | ICD-10-CM

## 2021-04-14 ENCOUNTER — Ambulatory Visit: Payer: BC Managed Care – PPO | Admitting: Internal Medicine

## 2021-04-14 ENCOUNTER — Other Ambulatory Visit: Payer: Self-pay

## 2021-04-14 ENCOUNTER — Ambulatory Visit (HOSPITAL_COMMUNITY)
Admission: RE | Admit: 2021-04-14 | Discharge: 2021-04-14 | Disposition: A | Discharge status: Home / Self Care | Payer: BC Managed Care – PPO | Source: Ambulatory Visit | Attending: Internal Medicine | Admitting: Internal Medicine

## 2021-04-14 ENCOUNTER — Other Ambulatory Visit: Payer: Self-pay | Admitting: Internal Medicine

## 2021-04-14 ENCOUNTER — Encounter: Payer: Self-pay | Admitting: Internal Medicine

## 2021-04-14 VITALS — BP 131/77 | HR 82 | Temp 98.7°F | Ht 60.0 in | Wt 204.3 lb

## 2021-04-14 DIAGNOSIS — M25551 Pain in right hip: Secondary | ICD-10-CM

## 2021-04-14 DIAGNOSIS — M542 Cervicalgia: Secondary | ICD-10-CM

## 2021-04-14 MED ORDER — CYCLOBENZAPRINE HCL 5 MG PO TABS
5.0000 mg | ORAL_TABLET | Freq: Three times a day (TID) | ORAL | 0 refills | Status: DC | PRN
Start: 2021-04-14 — End: 2021-04-18

## 2021-04-14 NOTE — Assessment & Plan Note (Signed)
The patient is here today for new onset right hip and leg pain.  She states that she initially had right-sided neck pain about a month ago.  However, yesterday she noticed right hip and leg pain, and she was walking with a limp this morning, prompting her to be seen today.  She denies numbness, tingling, dizziness, vision changes, back pain, sudden weight loss, or urinary incontinence.  This is never happened to her before. ? ?Exam: Windshield wiper test on the right side is somewhat limited due to pain, however is otherwise WNL.  No tenderness palpation of the greater trochanter area. ? ?Plan: ?We will obtain right hip films for further assessment of her pain.  Differential diagnosis includes OA.  Given that she also has pain in the right neck and right side areas, could consider fibromyalgia as a diagnosis if pain persists. ?

## 2021-04-14 NOTE — Assessment & Plan Note (Signed)
The patient presents today with a 1 month history of right-sided neck pain.  She has used ibuprofen and heating pads for the pain, which have not alleviated the symptoms.  She states that, out of all of her sites of pain she is having, the pain in her right neck is the worst. ? ?Exam reveals tenderness palpation of the musculature to the right side of the neck. ? ?Plan: ?Recommend Tylenol and short course of Flexeril for the neck pain.  Discussed that she should refrain from taking ibuprofen due to being on Entresto. ?

## 2021-04-14 NOTE — Patient Instructions (Addendum)
Thank you, [name] for allowing [name] to provide your care today. Today we discussed your neck and right hip pain. ? ?For your neck pain, I recommend taking Tylenol (not ibuprofen). I have also prescribed you a muscle relaxant called flexeril. You can take this three times a day as needed for the pain.  ? ?For your hip pain, I have ordered an x-ray. I will contact you with these results. ? ?I have ordered the following labs for you: ? ?Lab Orders  ?No laboratory test(s) ordered today  ?  ? ?Tests ordered today: ? ?Right hip x-ray ? ?Referrals ordered today:  ? ?Referral Orders  ?No referral(s) requested today  ?  ? ?I have ordered the following medication/changed the following medications:  ? ?Stop the following medications: ?There are no discontinued medications.  ? ?Start the following medications: ?No orders of the defined types were placed in this encounter. ?  ? ?Follow up:  as needed. Will contact you with X-ray results.   ? ? ?Should you have any questions or concerns please call the internal medicine clinic at [phone].   ? ? ? ?

## 2021-04-14 NOTE — Progress Notes (Signed)
? ?  CC: side pain ? ?HPI: ? ?[name] is a 56 y.o. with past medical history as noted below who presents to the clinic today for side pain. Please see problem-based list for further details, assessments, and plans.  ? ?Past Medical History:  ?Diagnosis Date  ? Acute pain of left shoulder 06/07/2017  ? Asthma   ? as a child  ? Asthma 11/07/2005  ? Reported Diagnosis in 2012, no formal spirometry   ? Cardiomyopathy ([place])   ? likely nonischemic DCM per Dr. Johnsie Cancel note 04/2017  ? Carpal tunnel syndrome   ? Chest pain 07/11/2018  ? Chronic HFrEF (heart failure with reduced ejection fraction) ([place]) 02/22/2012  ? Diabetes mellitus type 2 in obese ([place]) 11/17/2009  ? - Synjardy 05-998 BID, Januvia '100mg'$  Daily,  Basaglar - Failed to tolerate Victoza  ? Diabetes mellitus type 2, uncontrolled DX: 1999  ? Started as gestational diabetes  ? ESSENTIAL HYPERTENSION 07/08/2006  ? Essential hypertension 07/08/2006  ? 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 [employer]Free[employer]is?search=female%20pattern%20hair%20loss&source=search_result&selectedTitle=1~12&usage_type=default&display_rank=1  ? Gallstones   ? HSV 11/07/2005  ? Qualifier: Diagnosis of  By: Pearline Cables MD, Belenda Cruise    ? HSV (herpes simplex virus) anogenital infection   ? HYPERLIPIDEMIA 08/20/2007  ? Hyperlipidemia 08/20/2007  ? Atorvastatin '40mg'$  Daily Last Lipid panel may 2019: Total 138, HDL 31, LDL 89. ASCVD risk of 14.5%   ? LBBB (left bundle branch block)   ? Menorrhagia   ? Migraine   ? Normal CT head (08/06/2006)  ? Migraine without aura 09/02/2006  ? Fairly Well-controlled  2 per month  Takes Elitriptan   ? 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 Perimenopausal menorrhagia 02/20/2012  ? Psoriasis 04/29/2013  ? Right low back pain 03/02/2014  ? Routine adult health maintenance 09/27/2010  ? Swelling of abdominal wall 03/02/2014  ? Swelling of abdominal wall 03/02/2014  ?  Symptoms of upper respiratory infection (URI) 04/01/2018  ? Viral URI with cough 01/13/2013  ? ?Review of Systems: Negative aside from that listed in individualized problem based charting.  ? ?Physical Exam: ? ?Vitals:  ? 04/14/21 0952  ?BP: 131/77  ?Pulse: 82  ?Temp: 98.7 ?F (37.1 ?C)  ?TempSrc: Oral  ?SpO2: 100%  ?Weight: 204 lb 4.8 oz (92.7 kg)  ?Height: 5' (1.524 m)  ? ?General: NAD, nl appearance ?HE: Normocephalic, atraumatic, EOMI, Conjunctivae normal ?ENT: No congestion, no rhinorrhea, no exudate or erythema  ?Cardiovascular: Normal rate, regular rhythm. No murmurs, rubs, or gallops ?Pulmonary: Effort normal, breath sounds normal. No wheezes, rales, or rhonchi ?Abdominal: soft, nontender, bowel sounds present ?Musculoskeletal: Tenderness palpation the musculature of the right side of the neck.  Negative straight leg raise on both sides.  No spinal tenderness.  Windshield wiper test is somewhat limited due to pain, although otherwise WNL.  No tenderness palpation of the greater trochanter area.  Sensation is intact throughout. ?Skin: Warm, dry, no bruising, erythema, or rash ?Psychiatric/Behavioral: normal mood, normal behavior    ? ?Assessment & Plan:  ? ?See Encounters Tab for problem based charting. ? ?Patient discussed with Dr. Evette Doffing ? ?

## 2021-04-17 NOTE — Progress Notes (Signed)
Internal Medicine Clinic Attending ? ?I saw and evaluated the patient.  I personally confirmed the key portions of the history and exam documented by Dr. Lorin Glass and I reviewed pertinent patient test results.  The assessment, diagnosis, and plan were formulated together and I agree with the documentation in the resident?s note.  ? ?Patient with multiple sites of pain most prominent in neck and right hip. Exam is reassuring, no red flags, not function limiting at this time but is significant. Xrays show right hip OA which is likely the culprit with the hip pain, I also suspect she has cervical spine OA causing her neck pain with radiation into the right shoulder. Will try medical therapy, duloxetine may be helpful in the future, and could offer orthopedics consultation if she is interested in surgical interventions on the hip in the future. ?

## 2021-04-18 ENCOUNTER — Telehealth: Payer: Self-pay | Admitting: Internal Medicine

## 2021-04-18 NOTE — Telephone Encounter (Signed)
Called the patient to discuss recent imaging results. No answer, unable to leave VM due to mailbox being full. ?

## 2021-04-18 NOTE — Telephone Encounter (Signed)
Called patient to discuss imaging results.  These results reflect osteoarthritis, therefore would recommend continued Tylenol and Flexeril regimens.  The patient states that she stopped taking Flexeril because it made her too sleepy.  She states that she takes 1 pill of Tylenol at a time as needed for her pain and that it eases the pain somewhat, however the pain is still present.  She does not know what dose of Tylenol she takes.  At this time, we will discontinue Flexeril, however I discussed that the patient can continue to take Tylenol, 2 pills at a time as needed, but not to exceed the recommendations on the bottle.  Patient is amenable to this plan. ?

## 2021-04-18 NOTE — Addendum Note (Signed)
Addended by: Orvis Brill on: 04/18/2021 03:56 PM ? ? Modules accepted: Orders ? ?

## 2021-04-21 ENCOUNTER — Other Ambulatory Visit: Payer: Self-pay | Admitting: Internal Medicine

## 2021-04-21 DIAGNOSIS — E1169 Type 2 diabetes mellitus with other specified complication: Secondary | ICD-10-CM

## 2021-04-30 ENCOUNTER — Other Ambulatory Visit: Payer: Self-pay | Admitting: Internal Medicine

## 2021-04-30 DIAGNOSIS — K219 Gastro-esophageal reflux disease without esophagitis: Secondary | ICD-10-CM

## 2021-05-15 ENCOUNTER — Ambulatory Visit: Payer: BC Managed Care – PPO | Admitting: Internal Medicine

## 2021-05-15 ENCOUNTER — Encounter: Payer: Self-pay | Admitting: Internal Medicine

## 2021-05-15 ENCOUNTER — Other Ambulatory Visit: Payer: Self-pay

## 2021-05-15 VITALS — BP 131/79 | HR 85 | Temp 97.5°F | Ht 60.0 in | Wt 206.1 lb

## 2021-05-15 DIAGNOSIS — M1611 Unilateral primary osteoarthritis, right hip: Secondary | ICD-10-CM | POA: Diagnosis not present

## 2021-05-15 DIAGNOSIS — S96911A Strain of unspecified muscle and tendon at ankle and foot level, right foot, initial encounter: Secondary | ICD-10-CM | POA: Diagnosis not present

## 2021-05-15 DIAGNOSIS — E1169 Type 2 diabetes mellitus with other specified complication: Secondary | ICD-10-CM

## 2021-05-15 DIAGNOSIS — E669 Obesity, unspecified: Secondary | ICD-10-CM | POA: Diagnosis not present

## 2021-05-15 DIAGNOSIS — I5022 Chronic systolic (congestive) heart failure: Secondary | ICD-10-CM | POA: Diagnosis not present

## 2021-05-15 DIAGNOSIS — M542 Cervicalgia: Secondary | ICD-10-CM

## 2021-05-15 DIAGNOSIS — M25551 Pain in right hip: Secondary | ICD-10-CM

## 2021-05-15 HISTORY — DX: Strain of unspecified muscle and tendon at ankle and foot level, right foot, initial encounter: S96.911A

## 2021-05-15 LAB — POCT GLYCOSYLATED HEMOGLOBIN (HGB A1C): Hemoglobin A1C: 8.9 % — AB (ref 4.0–5.6)

## 2021-05-15 LAB — GLUCOSE, CAPILLARY: Glucose-Capillary: 155 mg/dL — ABNORMAL HIGH (ref 70–99)

## 2021-05-15 MED ORDER — BASAGLAR KWIKPEN 100 UNIT/ML ~~LOC~~ SOPN: 23.0000 [IU] | PEN_INJECTOR | Freq: Every day | SUBCUTANEOUS | 0 refills | Status: DC

## 2021-05-15 MED ORDER — METHOCARBAMOL 500 MG PO TABS: 500.0000 mg | ORAL_TABLET | Freq: Three times a day (TID) | ORAL | 0 refills | Status: AC | PRN

## 2021-05-15 MED ORDER — BD PEN NEEDLE MINI U/F 31G X 5 MM MISC: 1.0000 | Freq: Every day | 3 refills | Status: DC

## 2021-05-15 MED ORDER — SPIRONOLACTONE 25 MG PO TABS: 25.0000 mg | ORAL_TABLET | Freq: Every day | ORAL | 1 refills | Status: DC

## 2021-05-15 MED ORDER — SACUBITRIL-VALSARTAN 97-103 MG PO TABS: 1.0000 | ORAL_TABLET | Freq: Two times a day (BID) | ORAL | 1 refills | Status: DC

## 2021-05-15 MED ORDER — TRULICITY 1.5 MG/0.5ML ~~LOC~~ SOAJ: 1.5000 mg | SUBCUTANEOUS | 2 refills | Status: AC

## 2021-05-15 NOTE — Assessment & Plan Note (Signed)
A1c slightly improved from 9.3% at the last visit to 8.9% today. Patient has been taking Basaglar 09X qhs, Trulicity 8.33 mg weekly, and Synjardy 05-998 mg bid. She has been checking her fasting blood sugars every morning and they have been ranging from the 120-220s. Overall the patient feels well and has had no symptoms of hypoglycemia. She has had no issues with her medications, although she reports the pharmacy never gave her the increased dose of Trulicity and she has only been taking 0.75 mg per week (last injection was 1 day ago).  ? ?Plan: ?- Continue Basaglar 23u qhs ?- Increase Trulicity to 1.5 mg weekly ?- Continue Synjardy 05-998 mg bid ?- Follow up in 3 months ?

## 2021-05-15 NOTE — Assessment & Plan Note (Signed)
Patient presents today with pain in the back of her right foot that started suddenly 3 days ago. She states that she was at a funeral and was wearing high heels, and since she took off her shoes, she has had pain in the back of her right foot. She denies any injuries or falls and has no numbness/tingling of her feet or toes. The patient states that she has been limping since then, and the only thing that has helped her pain is wearing an ankle brace. I suspect that the patient may have strained her foot/ankle by wearing her high heels, especially knowing that she has right sided hip pain and osteoarthritis. On exam, ROM of her right foot/ankle is normal, although she does have some pain with dorsiflexion.  ? ?Plan: ?- Continue to wear ankle brace as needed for support ?- Referral to physical therapy  ?

## 2021-05-15 NOTE — Assessment & Plan Note (Signed)
Patient continues to have right hip pain and had an xray done about 1 month ago. Xray showed prominent degenerative changes both hips, as well as corticated bony densities noted in the buttocks and right abdomen. She continues to walk with a limp (although she attributes this to right foot pain). She denies any recent injuries or falls and has no red flag symptoms at this time. She has been using voltaren gel on her hip and states that this has not helped any. ? ?Plan: ?- Referral to ortho for osteoarthritis ?

## 2021-05-15 NOTE — Progress Notes (Signed)
? ?CC: diabetes follow up and right sided pain ? ?HPI: ? ?Sandra [name] is a 56 y.o. female with HFrEF, diabetes, hypertension, and GAD who presents to the [facility] for a follow up of her diabetes and right sided pain. Please see problem-based list for further details, assessments, and plans. ? ? ?Past Medical History:  ?Diagnosis Date  ? Acute pain of left shoulder 06/07/2017  ? Asthma   ? as a child  ? Asthma 11/07/2005  ? Reported Diagnosis in 2012, no formal spirometry   ? Cardiomyopathy ([place])   ? likely nonischemic DCM per Dr. Johnsie Cancel note 04/2017  ? Carpal tunnel syndrome   ? Chest pain 07/11/2018  ? Chronic HFrEF (heart failure with reduced ejection fraction) ([place]) 02/22/2012  ? Diabetes mellitus type 2 in obese ([place]) 11/17/2009  ? - Synjardy 05-998 BID, Januvia '100mg'$  Daily,  Basaglar - Failed to tolerate Victoza  ? Diabetes mellitus type 2, uncontrolled DX: 1999  ? Started as gestational diabetes  ? ESSENTIAL HYPERTENSION 07/08/2006  ? Essential hypertension 07/08/2006  ? 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 [employer]Freeze[employer]is?search=female%20pattern%20hair%20loss&source=search_result&selectedTitle=1~12&usage_type=default&display_rank=1  ? Gallstones   ? HSV 11/07/2005  ? Qualifier: Diagnosis of  By: Pearline Cables MD, Belenda Cruise    ? HSV (herpes simplex virus) anogenital infection   ? HYPERLIPIDEMIA 08/20/2007  ? Hyperlipidemia 08/20/2007  ? Atorvastatin '40mg'$  Daily Last Lipid panel may 2019: Total 138, HDL 31, LDL 89. ASCVD risk of 14.5%   ? LBBB (left bundle branch block)   ? Menorrhagia   ? Migraine   ? Normal CT head (08/06/2006)  ? Migraine without aura 09/02/2006  ? Fairly Well-controlled  2 per month  Takes Elitriptan   ? 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 Perimenopausal menorrhagia 02/20/2012  ? Psoriasis 04/29/2013  ? Right low back pain 03/02/2014  ? Routine adult health maintenance 09/27/2010  ? Swelling  of abdominal wall 03/02/2014  ? Swelling of abdominal wall 03/02/2014  ? Symptoms of upper respiratory infection (URI) 04/01/2018  ? Viral URI with cough 01/13/2013  ? ?Review of Systems:  Review of Systems  ?Constitutional:  Negative for chills and fever.  ?HENT: Negative.    ?Eyes:  Negative for blurred vision.  ?Cardiovascular:  Negative for chest pain and palpitations.  ?Gastrointestinal:  Negative for diarrhea, nausea and vomiting.  ?Genitourinary:  Negative for dysuria and hematuria.  ?Musculoskeletal:  Positive for joint pain and myalgias. Negative for falls.  ?Neurological:  Negative for dizziness and headaches.  ? ? ?Physical Exam: ? ?Vitals:  ? 05/15/21 1423  ?BP: 131/79  ?Pulse: 85  ?Temp: (!) 97.5 ?F (36.4 ?C)  ?TempSrc: Oral  ?SpO2: 100%  ?Weight: 206 lb 1.6 oz (93.5 kg)  ?Height: 5' (1.524 m)  ? ? ?General: Pleasant, well-appearing female. No acute distress. ?CV: RRR. No murmurs, rubs, or gallops.  ?Pulmonary: Lungs CTAB. Normal effort. No wheezing or rales. ?Abdominal: Soft, nontender, nondistended.  ?Extremities: Normal ROM of cervical spine- although pain is present with rotation of neck towards left. Normal ROM of right ankle, although pain is present with dorsiflexion. ?Skin: Warm and dry.  ?Neuro: A&Ox3. No focal deficit. ?Psych: Normal mood and affect ? ? ?Assessment & Plan:  ? ?See Encounters Tab for problem based charting. ? ?Patient discussed with Dr.  Saverio Danker ? ?Diabetes mellitus type 2 in obese [facility]) ?A1c slightly improved from 9.3% at the last visit to 8.9% today. Patient has been taking Basaglar 52W qhs, Trulicity 4.13 mg weekly, and Synjardy 05-998 mg bid. She has  been checking her fasting blood sugars every morning and they have been ranging from the 120-220s. Overall the patient feels well and has had no symptoms of hypoglycemia. She has had no issues with her medications, although she reports the pharmacy never gave her the increased dose of Trulicity and she has only been taking 0.75 mg per  week (last injection was 1 day ago).  ? ?Plan: ?- Continue Basaglar 23u qhs ?- Increase Trulicity to 1.5 mg weekly ?- Continue Synjardy 05-998 mg bid ?- Follow up in 3 months ? ?Right hip pain ?Patient continues to have right hip pain and had an xray done about 1 month ago. Xray showed prominent degenerative changes both hips, as well as corticated bony densities noted in the buttocks and right abdomen. She continues to walk with a limp (although she attributes this to right foot pain). She denies any recent injuries or falls and has no red flag symptoms at this time. She has been using voltaren gel on her hip and states that this has not helped any. ? ?Plan: ?- Referral to ortho for osteoarthritis ? ?Neck pain on right side ?Patient continues to have right sided neck/shoulder pain. She has tried taking ibuprofen, has used heating pads, and also tried flexeril (although this made her sleepy) and states that these do not help. Her pain is worse with movement of her neck towards the left. She denies any numbness/tingling down her arm and has normal ROM of her neck on exam. She has some pain with rotation of her neck towards the left side. As noted by the previous provider, I suspect that her pain is muscular in nature. ? ?Plan: ?- Short course of robaxin 500 mg q8h prn for muscle spasms ?- Referral to physical therapy  ? ?Right foot strain ?Patient presents today with pain in the back of her right foot that started suddenly 3 days ago. She states that she was at a funeral and was wearing high heels, and since she took off her shoes, she has had pain in the back of her right foot. She denies any injuries or falls and has no numbness/tingling of her feet or toes. The patient states that she has been limping since then, and the only thing that has helped her pain is wearing an ankle brace. I suspect that the patient may have strained her foot/ankle by wearing her high heels, especially knowing that she has right sided  hip pain and osteoarthritis. On exam, ROM of her right foot/ankle is normal, although she does have some pain with dorsiflexion.  ? ?Plan: ?- Continue to wear ankle brace as needed for support ?- Referral to physical therapy  ? ? ? ?

## 2021-05-15 NOTE — Patient Instructions (Addendum)
Thank you, Ms.Monquie A Leeds for allowing [name] to provide your care today. Today we discussed: ? ?Ankle pain: I believe you have strained your ankle and we are going to get you to see the physical therapist. You can continue to wear the ankle brace, but this should only be done for a short period of time because we want you to keep your ankle moving. ? ?Right neck/shoulder pain: Continue to use the heating pad if this helps your pain. You can also start taking Robaxin (muscle relaxer) up to every 8 hours  needed. If this medicine makes you sleepy, please stop taking it or take it at night before you sleep ? ?Hip pain: We are getting you to see the orthopedic doctors for your arthritis ? ?Diabetes: Keep using Basaglar 23 units each night. Increase Trulicity to 1.5 mg per week. Keep using Synjardy ? ?I have ordered the following labs for you: ? ? ?Lab Orders    ?     Glucose, capillary    ?     POC Hbg A1C     ? ? ?Referrals ordered today:  ? ? ?Referral Orders    ?     Ambulatory referral to Orthopedic Surgery    ?     Ambulatory referral to Physical Therapy     ? ?I have ordered the following medication/changed the following medications:  ? ?Stop the following medications: ?Medications Discontinued During This Encounter  ?Medication Reason  ? TRULICITY 7.49 SW/9.6PR SOPN   ? Insulin Pen Needle (B-D UF III MINI PEN NEEDLES) 31G X 5 MM MISC Reorder  ? sacubitril-valsartan (ENTRESTO) 97-103 MG Reorder  ? spironolactone (ALDACTONE) 25 MG tablet Reorder  ? Insulin Glargine (BASAGLAR KWIKPEN) 100 UNIT/ML Reorder  ?  ? ?Start the following medications: ?Meds ordered this encounter  ?Medications  ? Insulin Glargine (BASAGLAR KWIKPEN) 100 UNIT/ML  ?  Sig: Inject 23 Units into the skin at bedtime.  ?  Dispense:  15 mL  ?  Refill:  0  ? Dulaglutide (TRULICITY) 1.5 FF/6.3WG SOPN  ?  Sig: Inject 1.5 mg into the skin once a week.  ?  Dispense:  2 mL  ?  Refill:  2  ? sacubitril-valsartan (ENTRESTO) 97-103 MG  ?  Sig: Take 1  tablet by mouth 2 (two) times daily.  ?  Dispense:  60 tablet  ?  Refill:  1  ?  Please keep upcoming appt in February 2023 with Dr. Johnsie Cancel before anymore refills. Thank you  ? spironolactone (ALDACTONE) 25 MG tablet  ?  Sig: Take 1 tablet (25 mg total) by mouth daily. Please keep upcoming appointment in February 2023 for future refills. Thank you  ?  Dispense:  90 tablet  ?  Refill:  1  ? Insulin Pen Needle (B-D UF III MINI PEN NEEDLES) 31G X 5 MM MISC  ?  Sig: 1 Stick by Does not apply route daily. 11.69, insulin requirement  ?  Dispense:  30 each  ?  Refill:  3  ?  ? ?Follow up: 3 months for diabetes ? ? ?Remember: you can continue to use the voltaren gel, heating pads, and  ? ?Should you have any questions or concerns please call the internal medicine clinic at [phone].   ? ? ?Buddy Duty, D.O. ?[place] ? ? ?

## 2021-05-15 NOTE — Assessment & Plan Note (Signed)
Patient continues to have right sided neck/shoulder pain. She has tried taking ibuprofen, has used heating pads, and also tried flexeril (although this made her sleepy) and states that these do not help. Her pain is worse with movement of her neck towards the left. She denies any numbness/tingling down her arm and has normal ROM of her neck on exam. She has some pain with rotation of her neck towards the left side. As noted by the previous provider, I suspect that her pain is muscular in nature. ? ?Plan: ?- Short course of robaxin 500 mg q8h prn for muscle spasms ?- Referral to physical therapy  ?

## 2021-05-17 NOTE — Progress Notes (Signed)
Internal Medicine Clinic Attending ? ?Case discussed with Dr. Raymondo Band  At the time of the visit.  We reviewed the resident?s history and exam and pertinent patient test results.  I agree with the assessment, diagnosis, and plan of care documented in the resident?s note. Discussed limiting use of ankle brace in order to preserve ROM and promote healing.  ?

## 2021-06-05 ENCOUNTER — Other Ambulatory Visit: Payer: Self-pay

## 2021-06-05 ENCOUNTER — Ambulatory Visit (INDEPENDENT_AMBULATORY_CARE_PROVIDER_SITE_OTHER): Payer: BC Managed Care – PPO | Admitting: Orthopaedic Surgery

## 2021-06-05 VITALS — Ht 60.0 in | Wt 206.1 lb

## 2021-06-05 DIAGNOSIS — M25551 Pain in right hip: Secondary | ICD-10-CM

## 2021-06-05 DIAGNOSIS — M542 Cervicalgia: Secondary | ICD-10-CM | POA: Diagnosis not present

## 2021-06-05 NOTE — Progress Notes (Signed)
The patient is sent to me to evaluate and treat right hip pain.  She does report some groin pain and has had chronic pain in her right hip for some time now.  She is a diabetic and is also morbidly obese.  Her BMI is 40.25 today.  Her last hemoglobin A1c just 3 weeks ago was 8.9.  She walks without assistive ice.  She is only 56 years old.  She also complains of some neck pain and just right-sided pain in general. ? ?On examination she does have some truncal obesity.  She walks with just a slight limp.  Both hips actually have full internal and external rotation and no blocks to rotation and moves smoothly.  There is no pain in the groin with exam of either hip. ? ?I did review the x-rays on the canopy system of her hips.  I would call this mild arthritis of both hips but the joint space is still well-maintained. ? ?At this point I would recommend outpatient physical therapy and have recommended weight loss.  Her blood glucose also has to be under much better control before considering any other intervention including even a steroid injection in her right hip joint.  She would like to try outpatient physical therapy as well and we can have the therapist work on any modalities to decrease her neck pain and her hip pain and to work on core conditioning and strengthening in general.  Again she understands that weight loss is important as it is better blood glucose control.  I would like to see her back in 3 months to see how she is doing overall.  We can increase her weight and BMI calculation at that visit.  Thus far, I do not feel that she needs a hip replacement based on her clinical exam and x-ray findings. ?

## 2021-06-22 ENCOUNTER — Encounter: Payer: Self-pay | Admitting: Physical Therapy

## 2021-06-22 ENCOUNTER — Ambulatory Visit: Payer: BC Managed Care – PPO | Admitting: Physical Therapy

## 2021-06-22 ENCOUNTER — Other Ambulatory Visit: Payer: Self-pay

## 2021-06-22 DIAGNOSIS — M25551 Pain in right hip: Secondary | ICD-10-CM

## 2021-06-22 DIAGNOSIS — M542 Cervicalgia: Secondary | ICD-10-CM

## 2021-06-22 DIAGNOSIS — R29898 Other symptoms and signs involving the musculoskeletal system: Secondary | ICD-10-CM | POA: Diagnosis not present

## 2021-06-22 DIAGNOSIS — R293 Abnormal posture: Secondary | ICD-10-CM | POA: Diagnosis not present

## 2021-06-22 NOTE — Therapy (Signed)
OUTPATIENT PHYSICAL THERAPY EVALUATION   Patient Name: Sandra [name] MRN: [id number] DOB:Aug 30, 1965, 56 y.o., female Today's Date: 06/22/2021   PT End of Session - 06/22/21 1419     Visit Number 1    Number of Visits 6    Date for PT Re-Evaluation 08/03/21    Authorization Type BCBS    Authorization - Number of Visits 12    PT Start Time 1345    PT Stop Time 1418    PT Time Calculation (min) 33 min    Activity Tolerance Patient tolerated treatment well    Behavior During Therapy WFL for tasks assessed/performed             Past Medical History:  Diagnosis Date   Acute pain of left shoulder 06/07/2017   Asthma    as a child   Asthma 11/07/2005   Reported Diagnosis in 2012, no formal spirometry    Cardiomyopathy ([place])    likely nonischemic DCM per Dr. 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   Perimenopausal menorrhagia 02/20/2012   Psoriasis 04/29/2013   Right low back pain 03/02/2014   Routine adult health maintenance 09/27/2010   Swelling of abdominal wall 03/02/2014   Swelling of abdominal wall 03/02/2014   Symptoms of upper respiratory infection (URI) 04/01/2018   Viral URI with cough 01/13/2013   Past Surgical History:  Procedure Laterality Date   CHOLECYSTECTOMY  2009   DILATATION & CURETTAGE/HYSTEROSCOPY WITH MYOSURE N/A 12/25/2017   Procedure: DILATATION & CURETTAGE/HYSTEROSCOPY   POLYPECTOMY WITH MYOSURE;  Surgeon: Charyl Bigger, MD;  Location: [facility];  Service: Gynecology;  Laterality: N/A;   TUBAL LIGATION     Patient Active Problem List   Diagnosis Date Noted   Right foot strain 05/15/2021   Right hip pain 04/14/2021   Neck pain on right side 04/14/2021   Tinnitus 06/01/2020   Lipoma 02/23/2020   Dyspnea on exertion 10/15/2019   GERD (gastroesophageal reflux disease) 10/15/2019   Left sided numbness 10/15/2019   Generalized anxiety disorder 09/21/2019   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   Psoriasis 04/29/2013   Adenomyosis 06/03/2012   Obesity 05/16/2012   Chronic HFrEF (heart failure with reduced ejection fraction) ([place]) 02/22/2012   Perimenopausal menorrhagia 02/20/2012   LBBB (left bundle branch block) 01/24/2012   Routine adult health maintenance 09/27/2010   Diabetes mellitus type 2 in  obese ([place]) 11/17/2009   Hyperlipidemia 08/20/2007   Migraine without aura 09/02/2006   Essential hypertension 07/08/2006   HSV 11/07/2005   Asthma 11/07/2005    PCP: Buddy Duty, DO  REFERRING PROVIDER: Mcarthur Rossetti, MD   REFERRING DIAG: [phone] (ICD-10-CM) - Pain of right hip M54.2 (ICD-10-CM) - Cervicalgia   THERAPY DIAG:  Cervicalgia - Plan: PT plan of care cert/re-cert  Pain in right  hip - Plan: PT plan of care cert/re-cert  Abnormal posture - Plan: PT plan of care cert/re-cert  Other symptoms and signs involving the musculoskeletal system - Plan: PT plan of care cert/re-cert  Rationale for Evaluation and Treatment Rehabilitation  ONSET DATE: 2 months ago  SUBJECTIVE:   SUBJECTIVE STATEMENT: Pt reports she began having a lot of pain on her Rt side which started in her neck then progressed to her hip and leg.  Pain started about 2 months ago without known injury.  PERTINENT HISTORY: Asthma, cardiomyopathy, CHF, DM, HTN, obesity, migraines  PAIN:  Are you having pain? Yes: NPRS scale: 2 currently, up to 8, at best 0/10 Pain location: Rt side neck into shoulder Pain description: aching, sore Aggravating factors: worse at night, lying on side Relieving factors: tylenol  PAIN:  Are you having pain? Yes: NPRS scale: 0, up to 8-9/10 Pain location: Rt hip Pain description: throbbing, numbness Aggravating factors: lying on Rt side, weather Relieving factors: tylenol   PRECAUTIONS: None  WEIGHT BEARING RESTRICTIONS No  FALLS:  Has patient fallen in last 6 months? 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, may have to do some lifting/diaper changes, etc  PLOF: Independent and Leisure: shopping , no regular exercise  PATIENT GOALS improve pain, avoid medications if possible   OBJECTIVE:   DIAGNOSTIC FINDINGS: X-rays: mild hip OA  PATIENT SURVEYS:  06/22/21: FOTO 59 (predicted 19)  COGNITION:  Overall cognitive status: Within functional limits for tasks assessed     SENSATION: WFL   POSTURE: rounded shoulders, forward head, and increased thoracic kyphosis  PALPATION: 06/22/21: tenderness along Rt greater  trochanter and into glutes, Rt cervical paraspinals and upper traps with trigger points  CERVICAL ROM:   Active ROM A/PROM (deg) eval  Flexion 45 (with pain)  Extension 32  Right lateral flexion 38  Left lateral flexion 38  Right rotation 75  Left rotation 76   (Blank rows = not tested)  LOWER EXTREMITY ROM:  WNL  LOWER EXTREMITY MMT:  MMT Right eval Left eval  Hip flexion 5/5 5/5  Hip extension 3/5   Hip abduction 3/5   Knee flexion 5/5 5/5  Knee extension 5/5 5/5   (Blank rows = not tested)  LOWER EXTREMITY SPECIAL TESTS:  Hip special tests: Saralyn Pilar (FABER) test: negative   GAIT: Independent with amb    TODAY'S TREATMENT: 06/22/21 See HEP- performed trial reps PRN for comprehension, mod cues needed for instruction   PATIENT EDUCATION:  Education details: HEP Person educated: Patient Education method: Explanation, Demonstration, and Handouts Education comprehension: verbalized understanding, returned demonstration, and needs further education   HOME EXERCISE PROGRAM: Access Code: [email or web address] URL: [email or web address] Date: 06/22/2021 Prepared by: Faustino Congress  Exercises - Standing Backward Shoulder Rolls  - 2 x daily - 7 x weekly - 1 sets - 10 reps - Seated  Scapular Retraction  - 2 x daily - 7 x weekly - 1 sets - 10 reps - 5 sec hold - Seated Cervical Retraction  - 2 x daily - 7 x weekly - 1 sets - 10 reps - 5 sec hold - Supine Piriformis Stretch with Foot on Ground  - 2 x daily - 7 x weekly - 1 sets - 3 reps - 30 sec hold - Supine Bridge  - 2 x daily - 7 x weekly - 1 sets - 10 reps - 5 sec hold - Sidelying Hip Abduction  - 2 x daily - 7 x weekly - 1 sets - 10 reps  ASSESSMENT:  CLINICAL IMPRESSION: Patient is a 56 y.o. female who was seen today for physical therapy evaluation and treatment for Rt sided neck and hip pain.  She demonstrates decreased strength, active trigger points and postural abnormalities affecting functional  mobility.  Pt will benefit from PT to address deficits.    OBJECTIVE IMPAIRMENTS decreased strength, increased fascial restrictions, increased muscle spasms, postural dysfunction, and pain.   ACTIVITY LIMITATIONS carrying, lifting, bending, sitting, sleeping, stairs, transfers, bed mobility, and locomotion level  PARTICIPATION LIMITATIONS: cleaning, laundry, shopping, community activity, and occupation  PERSONAL FACTORS 3+ comorbidities: Asthma, cardiomyopathy, CHF, DM, HTN, obesity, migraines  are also affecting patient's functional outcome.   REHAB POTENTIAL: Good  CLINICAL DECISION MAKING: Evolving/moderate complexity  EVALUATION COMPLEXITY: Moderate   GOALS: Goals reviewed with patient? Yes  SHORT TERM GOALS: Target date: 07/13/2021  Independent with initial HEP Goal status: INITIAL   LONG TERM GOALS: Target date: 08/03/21  Independent with final HEP Goal status: INITIAL  2.  FOTO score improved to 66 Goal status: INITIAL  3.  Rt hip strength improved to 4/5 for improved function Goal status: INIITAL  4.  Report pain < 5/10 with sleeping on Rt side for improved function Goal status: INITIAL    PLAN: PT FREQUENCY: 1x/week  PT DURATION: 6 weeks  PLANNED INTERVENTIONS: Therapeutic exercises, Therapeutic activity, Neuromuscular re-education, Patient/Family education, Joint mobilization, Stair training, Aquatic Therapy, Dry Needling, Electrical stimulation, Spinal manipulation, Spinal mobilization, Cryotherapy, Moist heat, Taping, Traction, Manual therapy, and Re-evaluation  PLAN FOR NEXT SESSION: review HEP, manual/modalities PRN for pain, general postural/hip/core stengthening   Laureen Abrahams, PT, DPT 06/22/21 2:25 PM

## 2021-07-05 ENCOUNTER — Ambulatory Visit (INDEPENDENT_AMBULATORY_CARE_PROVIDER_SITE_OTHER): Payer: BC Managed Care – PPO | Admitting: Physical Therapy

## 2021-07-05 ENCOUNTER — Encounter: Payer: Self-pay | Admitting: Physical Therapy

## 2021-07-05 DIAGNOSIS — R293 Abnormal posture: Secondary | ICD-10-CM | POA: Diagnosis not present

## 2021-07-05 DIAGNOSIS — M542 Cervicalgia: Secondary | ICD-10-CM

## 2021-07-05 DIAGNOSIS — R29898 Other symptoms and signs involving the musculoskeletal system: Secondary | ICD-10-CM

## 2021-07-05 DIAGNOSIS — M25551 Pain in right hip: Secondary | ICD-10-CM | POA: Diagnosis not present

## 2021-07-05 NOTE — Therapy (Addendum)
OUTPATIENT PHYSICAL THERAPY TREATMENT NOTE PHYSICAL THERAPY DISCHARGE SUMMARY  Visits from Start of Care: 2  Current functional level related to goals / functional outcomes: See below   Remaining deficits: See below   Education / Equipment: HEP  Plan:  Patient goals were not met. Patient is being discharged due to she feels she does need any more PT and has not returned. Elsie Ra, PT, DPT 09/28/21 1:25 PM       Patient Name: Sandra [name] MRN: [id number] DOB:1965-03-02, 56 y.o., female Today's Date: 07/05/2021  PCP: Buddy Duty, DO REFERRING PROVIDER: Mcarthur Rossetti, MD   END OF SESSION:   PT End of Session - 07/05/21 1551     Visit Number 2    Number of Visits 6    Date for PT Re-Evaluation 08/03/21    Authorization Type BCBS    Authorization - Number of Visits 11    PT Start Time 1512    PT Stop Time 1550    PT Time Calculation (min) 38 min    Activity Tolerance Patient tolerated treatment well    Behavior During Therapy WFL for tasks assessed/performed             Past Medical History:  Diagnosis Date   Acute pain of left shoulder 06/07/2017   Asthma    as a child   Asthma 11/07/2005   Reported Diagnosis in 2012, no formal spirometry    Cardiomyopathy ([place])    likely nonischemic DCM per Dr. 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 ASCVD risk of 14.5%    LBBB (left bundle branch block)    Menorrhagia    Migraine    Normal CT head (08/06/2006)   Migraine without aura 09/02/2006   Fairly Well-controlled  2 per month  Takes Elitriptan    [place]ischemic [place]   Perimenopausal menorrhagia 02/20/2012   Psoriasis 04/29/2013   Right low back pain 03/02/2014   Routine adult health maintenance 09/27/2010   Swelling of abdominal wall 03/02/2014   Swelling of abdominal wall 03/02/2014   Symptoms of upper respiratory infection (URI) 04/01/2018   Viral URI with cough 01/13/2013   Past Surgical History:  Procedure Laterality Date   CHOLECYSTECTOMY  2009   DILATATION & CURETTAGE/HYSTEROSCOPY WITH MYOSURE N/A 12/25/2017   Procedure: DILATATION & CURETTAGE/HYSTEROSCOPY   POLYPECTOMY WITH MYOSURE;  Surgeon: AlCharyl BiggerMD;  [facility];  Service: Gynecology;  Laterality: N/A;   TUBAL LIGATION     Patient Active Problem List   Diagnosis Date Noted   Right foot strain 05/15/2021   Right hip pain 04/14/2021   Neck pain on right side 04/14/2021   Tinnitus 06/01/2020   Lipoma 02/23/2020  Dyspnea on exertion 10/15/2019   GERD (gastroesophageal reflux disease) 10/15/2019   Left sided numbness 10/15/2019   Generalized anxiety disorder 09/21/2019   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   Psoriasis 04/29/2013   Adenomyosis 06/03/2012   Obesity 05/16/2012    Chronic HFrEF (heart failure with reduced ejection fraction) ([place]) 02/22/2012   Perimenopausal menorrhagia 02/20/2012   LBBB (left bundle branch block) 01/24/2012   Routine adult health maintenance 09/27/2010   Diabetes mellitus type 2 in obese ([place]) 11/17/2009   Hyperlipidemia 08/20/2007   Migraine without aura 09/02/2006   Essential hypertension 07/08/2006   HSV 11/07/2005   Asthma 11/07/2005    REFERRING DIAG: M25.551 (ICD-10-CM) - Pain of right hip M54.2 (ICD-10-CM) - Cervicalgia   THERAPY DIAG:  Cervicalgia  Pain in right hip  Abnormal posture  Other symptoms and signs involving the musculoskeletal system  Rationale for Evaluation and Treatment Rehabilitation  PERTINENT HISTORY: Asthma, cardiomyopathy, CHF, DM, HTN, obesity, migraines  PRECAUTIONS: None  SUBJECTIVE: Doing well, hasn't really had much pain recently, using the tennis ball but not doing all of her HEP  PAIN:  Are you having pain? No   OBJECTIVE: (objective measures completed at initial evaluation unless otherwise dated)   PATIENT SURVEYS:  06/22/21: FOTO 59 (predicted 66)    PALPATION: 06/22/21: tenderness along Rt greater trochanter and into glutes, Rt cervical paraspinals and upper traps with trigger points   CERVICAL ROM:    Active ROM A/PROM (deg) eval  Flexion 45 (with pain)  Extension 32  Right lateral flexion 38  Left lateral flexion 38  Right rotation 75  Left rotation 76   (Blank rows = not tested)   LOWER EXTREMITY ROM:   WNL   LOWER EXTREMITY MMT:   MMT Right eval Left eval  Hip flexion 5/5 5/5  Hip extension 3/5    Hip abduction 3/5    Knee flexion 5/5 5/5  Knee extension 5/5 5/5   (Blank rows = not tested)   LOWER EXTREMITY SPECIAL TESTS:  Hip special tests: Saralyn Pilar (FABER) test: negative     GAIT: Independent with amb       TODAY'S TREATMENT: 07/05/21 [place]:      Aerobic: NuStep L5 x 8 min     Sitting: Shoulder rolls x 10 reps Scapular retraction x 10  reps Cervical retraction x 10 reps     Supine: Piriformis stretch 3x30 sec bil Bridges x 10 reps  Sidelying hip abduction x 10 reps bil      Standing: Calf raised x 10 reps Hip extension x 10 reps bil Hip abduction x 10 reps bil Squats 2 x 10 reps  06/22/21 See HEP- performed trial reps PRN for comprehension, mod cues needed for instruction     PATIENT EDUCATION:  Education details: HEP Person educated: Patient Education method: Explanation, Demonstration, and Handouts Education comprehension: verbalized understanding, returned demonstration, and needs further education     HOME EXERCISE PROGRAM: Access Code: [email or web address] URL: [email or web address] Date: 06/22/2021 Prepared by: Faustino Congress   Exercises - Standing Backward Shoulder Rolls  - 2 x daily - 7 x weekly - 1 sets - 10 reps - Seated Scapular Retraction  - 2 x daily - 7 x weekly - 1 sets - 10 reps - 5 sec hold - Seated Cervical Retraction  - 2 x daily - 7 x weekly - 1 sets - 10 reps - 5 sec hold - Supine Piriformis Stretch with Foot on Ground  -  2 x daily - 7 x weekly - 1 sets - 3 reps - 30 sec hold - Supine Bridge  - 2 x daily - 7 x weekly - 1 sets - 10 reps - 5 sec hold - Sidelying Hip Abduction  - 2 x daily - 7 x weekly - 1 sets - 10 reps   ASSESSMENT:   CLINICAL IMPRESSION: Pt tolerated session well today and reports very minimal pain since eval.  Anticipate if she still pain free next session may be able to d/c.  STG #1 met today.   OBJECTIVE IMPAIRMENTS decreased strength, increased fascial restrictions, increased muscle spasms, postural dysfunction, and pain.    ACTIVITY LIMITATIONS carrying, lifting, bending, sitting, sleeping, stairs, transfers, bed mobility, and locomotion level   PARTICIPATION LIMITATIONS: cleaning, laundry, shopping, community activity, and occupation   PERSONAL FACTORS 3+ comorbidities: Asthma, cardiomyopathy, CHF, DM, HTN, obesity, migraines  are also affecting  patient's functional outcome.    REHAB POTENTIAL: Good   CLINICAL DECISION MAKING: Evolving/moderate complexity   EVALUATION COMPLEXITY: Moderate     GOALS: Goals reviewed with patient? Yes   SHORT TERM GOALS: Target date: 07/13/2021   Independent with initial HEP Goal status: MET 07/05/21     LONG TERM GOALS: Target date: 08/03/21   Independent with final HEP Goal status: INITIAL   2.  FOTO score improved to 66 Goal status: INITIAL   3.  Rt hip strength improved to 4/5 for improved function Goal status: INIITAL   4.  Report pain < 5/10 with sleeping on Rt side for improved function Goal status: INITIAL       PLAN: PT FREQUENCY: 1x/week   PT DURATION: 6 weeks   PLANNED INTERVENTIONS: Therapeutic exercises, Therapeutic activity, Neuromuscular re-education, Patient/Family education, Joint mobilization, Stair training, Aquatic Therapy, Dry Needling, Electrical stimulation, Spinal manipulation, Spinal mobilization, Cryotherapy, Moist heat, Taping, Traction, Manual therapy, and Re-evaluation   PLAN FOR NEXT SESSION: possible d/c if pt is still doing well    Laureen Abrahams, PT, DPT 07/05/21 3:54 PM

## 2021-07-10 ENCOUNTER — Other Ambulatory Visit: Payer: Self-pay | Admitting: Internal Medicine

## 2021-07-10 DIAGNOSIS — I5022 Chronic systolic (congestive) heart failure: Secondary | ICD-10-CM

## 2021-07-11 ENCOUNTER — Other Ambulatory Visit: Payer: Self-pay | Admitting: Internal Medicine

## 2021-07-11 DIAGNOSIS — E669 Obesity, unspecified: Secondary | ICD-10-CM

## 2021-07-12 ENCOUNTER — Encounter: Payer: BC Managed Care – PPO | Admitting: Physical Therapy

## 2021-07-19 ENCOUNTER — Encounter: Payer: BC Managed Care – PPO | Admitting: Physical Therapy

## 2021-08-01 ENCOUNTER — Encounter: Payer: Self-pay | Admitting: Physical Therapy

## 2021-08-01 ENCOUNTER — Encounter (INDEPENDENT_AMBULATORY_CARE_PROVIDER_SITE_OTHER): Payer: BC Managed Care – PPO | Admitting: Physical Therapy

## 2021-08-01 DIAGNOSIS — M542 Cervicalgia: Secondary | ICD-10-CM

## 2021-08-01 DIAGNOSIS — M25551 Pain in right hip: Secondary | ICD-10-CM | POA: Diagnosis not present

## 2021-08-01 DIAGNOSIS — R29898 Other symptoms and signs involving the musculoskeletal system: Secondary | ICD-10-CM

## 2021-08-01 DIAGNOSIS — R293 Abnormal posture: Secondary | ICD-10-CM | POA: Diagnosis not present

## 2021-08-01 NOTE — Therapy (Signed)
OUTPATIENT PHYSICAL THERAPY TREATMENT NOTE   Patient Name: Sandra [name] MRN: [id number] DOB:08/03/1965, 56 y.o., female Today's Date: 08/01/2021  PCP: Buddy Duty, DO REFERRING PROVIDER: Mcarthur Rossetti, MD   END OF SESSION:   PT End of Session - 08/01/21 1459     Visit Number 3    Number of Visits 6    Date for PT Re-Evaluation 08/03/21    Authorization Type BCBS    Authorization - Number of Visits 58    PT Start Time 0305    PT Stop Time 0343    PT Time Calculation (min) 38 min    Activity Tolerance Patient tolerated treatment well    Behavior During Therapy [facility] for tasks assessed/performed             Past Medical History:  Diagnosis Date   Acute pain of left shoulder 06/07/2017   Asthma    as a child   Asthma 11/07/2005   Reported Diagnosis in 2012, no formal spirometry    Cardiomyopathy ([place])    likely nonischemic DCM per Dr. 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   Perimenopausal menorrhagia 02/20/2012   Psoriasis 04/29/2013   Right low back pain 03/02/2014   Routine adult health maintenance 09/27/2010   Swelling of abdominal wall 03/02/2014   Swelling of abdominal wall 03/02/2014   Symptoms of upper respiratory infection (URI) 04/01/2018   Viral URI with cough 01/13/2013   Past Surgical History:  Procedure Laterality Date   CHOLECYSTECTOMY  2009   DILATATION & CURETTAGE/HYSTEROSCOPY WITH MYOSURE N/A 12/25/2017   Procedure: DILATATION & CURETTAGE/HYSTEROSCOPY   POLYPECTOMY WITH MYOSURE;  Surgeon: AlCharyl BiggerMD;  [facility];  Service: Gynecology;  Laterality: N/A;   TUBAL LIGATION     Patient Active Problem List   Diagnosis Date Noted   Right foot strain 05/15/2021   Right hip pain 04/14/2021   Neck pain on right side 04/14/2021   Tinnitus 06/01/2020   Lipoma 02/23/2020   Dyspnea on exertion 10/15/2019   GERD (gastroesophageal reflux disease) 10/15/2019   Left sided numbness 10/15/2019   Generalized anxiety disorder 09/21/2019   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   Nonischemic dilated cardiomyopathy ([place]02/27/2019   Psoriasis 04/29/2013   Adenomyosis 06/03/2012   Obesity 05/16/2012   Chronic HFrEF (heart failure with reduced ejection fraction) ([place]01/31/2014   Perimenopausal menorrhagia 02/20/2012   LBBB (left bundle  branch block) 01/24/2012   Routine adult health maintenance 09/27/2010   Diabetes mellitus type 2 in obese ([place]) 11/17/2009   Hyperlipidemia 08/20/2007   Migraine without aura 09/02/2006   Essential hypertension 07/08/2006   HSV 11/07/2005   Asthma 11/07/2005    REFERRING DIAG: M25.551 (ICD-10-CM) - Pain of right hip M54.2 (ICD-10-CM) - Cervicalgia   THERAPY DIAG:  Cervicalgia  Pain in right  hip  Abnormal posture  Other symptoms and signs involving the musculoskeletal system  Rationale for Evaluation and Treatment Rehabilitation  PERTINENT HISTORY: Asthma, cardiomyopathy, CHF, DM, HTN, obesity, migraines  PRECAUTIONS: None  SUBJECTIVE: Doing well, hasn't really had much pain recently, using the tennis ball but not doing all of her HEP  PAIN:  Are you having pain? No   OBJECTIVE: (objective measures completed at initial evaluation unless otherwise dated)   PATIENT SURVEYS:  06/22/21: FOTO 59 (predicted 66)    PALPATION: 06/22/21: tenderness along Rt greater trochanter and into glutes, Rt cervical paraspinals and upper traps with trigger points   CERVICAL ROM:    Active ROM A/PROM (deg) eval  Flexion 45 (with pain)  Extension 32  Right lateral flexion 38  Left lateral flexion 38  Right rotation 75  Left rotation 76   (Blank rows = not tested)   LOWER EXTREMITY ROM:   WNL   LOWER EXTREMITY MMT:   MMT Right eval Left eval  Hip flexion 5/5 5/5  Hip extension 3/5    Hip abduction 3/5    Knee flexion 5/5 5/5  Knee extension 5/5 5/5   (Blank rows = not tested)   LOWER EXTREMITY SPECIAL TESTS:  Hip special tests: Saralyn Pilar (FABER) test: negative     GAIT: Independent with amb       TODAY'S TREATMENT: 08/01/21 Terex:      Aerobic: NuStep L5 x 8 min     Sitting: Shoulder rolls x 10 reps Scapular retraction x 10 reps Cervical retraction x 10 reps     Supine: Piriformis stretch 3x30 sec bil Bridges x 10 reps  Sidelying hip abduction x 10 reps bil      Standing: Calf raised x 10 reps Hip extension x 10 reps bil Hip abduction x 10 reps bil Squats 2 x 10 reps  07/05/21 Terex:      Aerobic: NuStep L5 x 8 min     Sitting: Shoulder rolls x 10 reps Scapular retraction x 10 reps Cervical retraction x 10 reps     Supine: Piriformis stretch 3x30 sec bil Bridges x 10 reps  Sidelying hip abduction x 10 reps bil      Standing: Calf raised x  10 reps Hip extension x 10 reps bil Hip abduction x 10 reps bil Squats 2 x 10 reps  06/22/21 See HEP- performed trial reps PRN for comprehension, mod cues needed for instruction     PATIENT EDUCATION:  Education details: HEP Person educated: Patient Education method: Explanation, Demonstration, and Handouts Education comprehension: verbalized understanding, returned demonstration, and needs further education     HOME EXERCISE PROGRAM: Access Code: [email or web address] URL: [email or web address] Date: 06/22/2021 Prepared by: Faustino Congress   Exercises - Standing Backward Shoulder Rolls  - 2 x daily - 7 x weekly - 1 sets - 10 reps - Seated Scapular Retraction  - 2 x daily - 7 x weekly - 1 sets - 10 reps - 5 sec hold - Seated Cervical Retraction  - 2 x daily - 7 x weekly - 1 sets - 10 reps -  5 sec hold - Supine Piriformis Stretch with Foot on Ground  - 2 x daily - 7 x weekly - 1 sets - 3 reps - 30 sec hold - Supine Bridge  - 2 x daily - 7 x weekly - 1 sets - 10 reps - 5 sec hold - Sidelying Hip Abduction  - 2 x daily - 7 x weekly - 1 sets - 10 reps   ASSESSMENT:   CLINICAL IMPRESSION: Pt tolerated session well today and reports very minimal pain since eval.  Anticipate if she still pain free next session may be able to d/c.  STG #1 met today.   OBJECTIVE IMPAIRMENTS decreased strength, increased fascial restrictions, increased muscle spasms, postural dysfunction, and pain.    ACTIVITY LIMITATIONS carrying, lifting, bending, sitting, sleeping, stairs, transfers, bed mobility, and locomotion level   PARTICIPATION LIMITATIONS: cleaning, laundry, shopping, community activity, and occupation   PERSONAL FACTORS 3+ comorbidities: Asthma, cardiomyopathy, CHF, DM, HTN, obesity, migraines  are also affecting patient's functional outcome.    REHAB POTENTIAL: Good   CLINICAL DECISION MAKING: Evolving/moderate complexity   EVALUATION COMPLEXITY: Moderate     GOALS: Goals  reviewed with patient? Yes   SHORT TERM GOALS: Target date: 07/13/2021   Independent with initial HEP Goal status: MET 07/05/21     LONG TERM GOALS: Target date: 08/03/21   Independent with final HEP Goal status: INITIAL   2.  FOTO score improved to 66 Goal status: INITIAL   3.  Rt hip strength improved to 4/5 for improved function Goal status: INIITAL   4.  Report pain < 5/10 with sleeping on Rt side for improved function Goal status: INITIAL       PLAN: PT FREQUENCY: 1x/week   PT DURATION: 6 weeks   PLANNED INTERVENTIONS: Therapeutic exercises, Therapeutic activity, Neuromuscular re-education, Patient/Family education, Joint mobilization, Stair training, Aquatic Therapy, Dry Needling, Electrical stimulation, Spinal manipulation, Spinal mobilization, Cryotherapy, Moist heat, Taping, Traction, Manual therapy, and Re-evaluation   PLAN FOR NEXT SESSION: possible d/c if pt is still doing well   Elsie Ra, PT, DPT 08/01/21 3:02 PM

## 2021-08-09 NOTE — Progress Notes (Signed)
This encounter was created in error - please disregard.

## 2021-08-13 ENCOUNTER — Other Ambulatory Visit: Payer: Self-pay | Admitting: Internal Medicine

## 2021-08-13 DIAGNOSIS — E1169 Type 2 diabetes mellitus with other specified complication: Secondary | ICD-10-CM

## 2021-08-25 IMAGING — CT CT ANGIO CHEST
3 of 7 series · 18 of 36 positions shown · IV contrast (OMNIPAQUE 350)
Comparison: None.

CLINICAL DATA: Shortness of breath, BY3E6-JK positive 09/02/2019

EXAM:
CT ANGIOGRAPHY CHEST WITH CONTRAST
TECHNIQUE: Multidetector CT imaging of the chest was performed using the
standard protocol during bolus administration of intravenous
contrast. Multiplanar CT image reconstructions and MIPs were
obtained to evaluate the vascular anatomy.
CONTRAST:  100mL OMNIPAQUE IOHEXOL 350 MG/ML SOLN

[Series 5: thins · axial · 0.71mm/px · z∈[+1362,+1567]mm · 12 of 243 slices shown]
[im 19/243  lung]
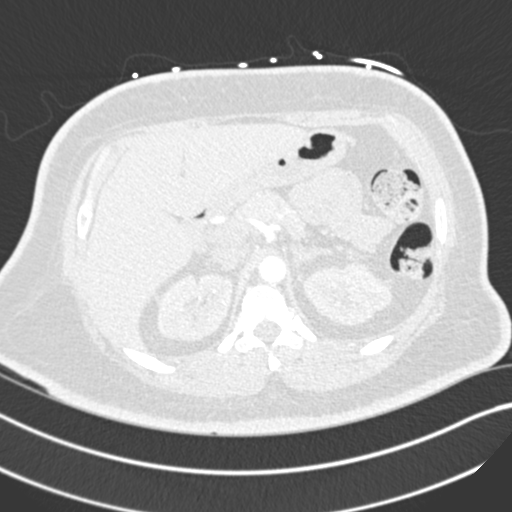
[im 38/243  mediastinal]
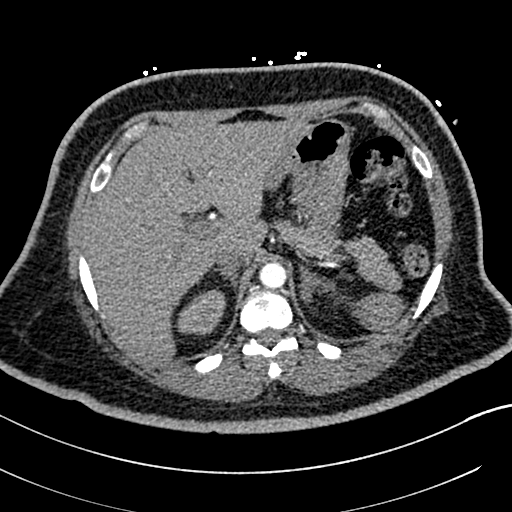
[im 56/243  lung]
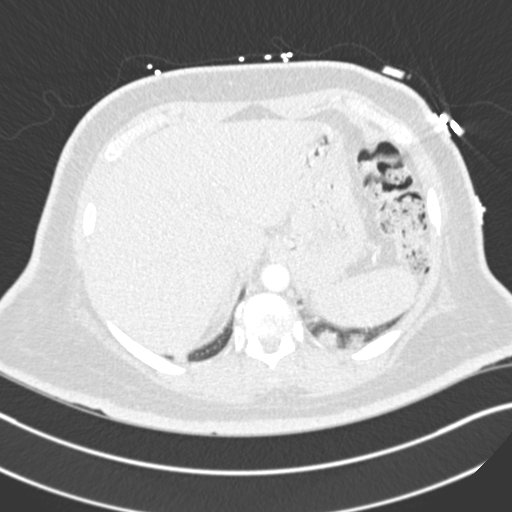
[im 75/243  mediastinal]
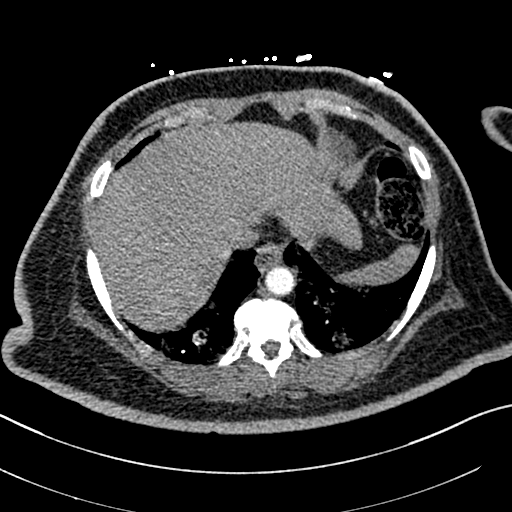
[im 94/243  lung]
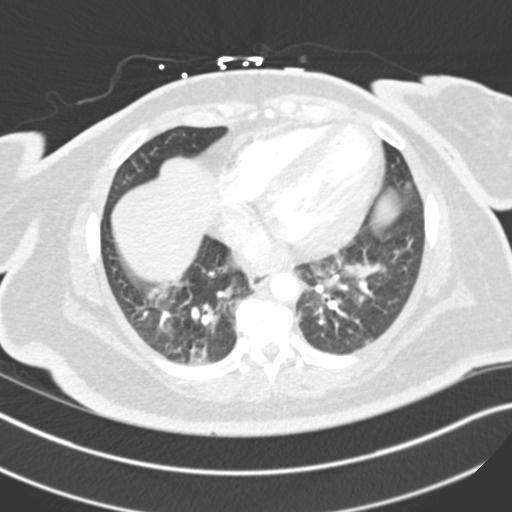
[im 112/243  mediastinal]
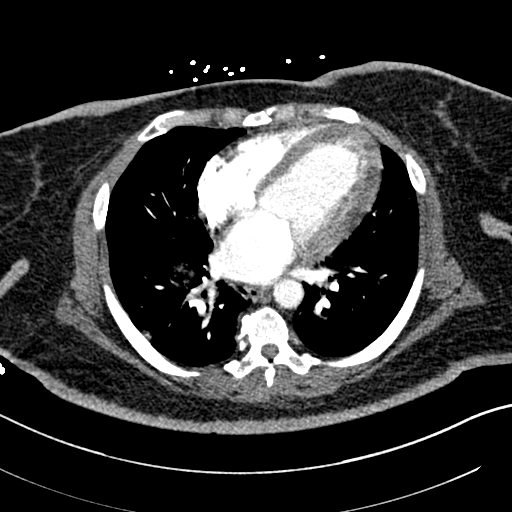
[im 131/243  lung]
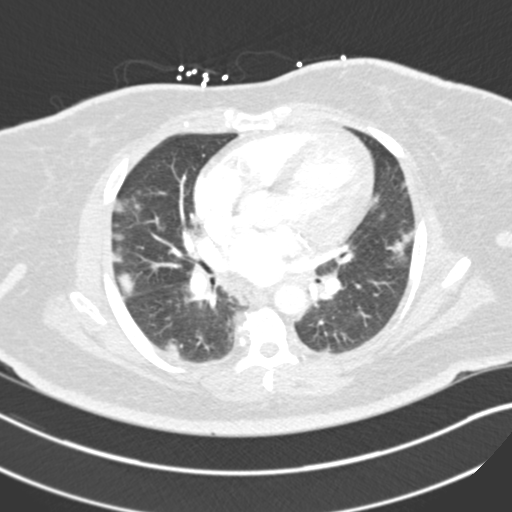
[im 149/243  mediastinal]
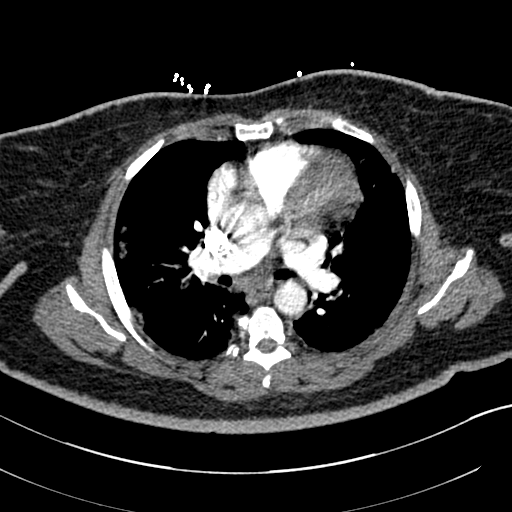
[im 168/243  lung]
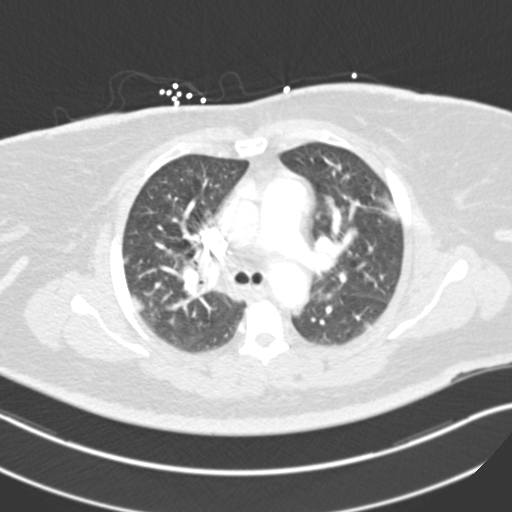
[im 187/243  mediastinal]
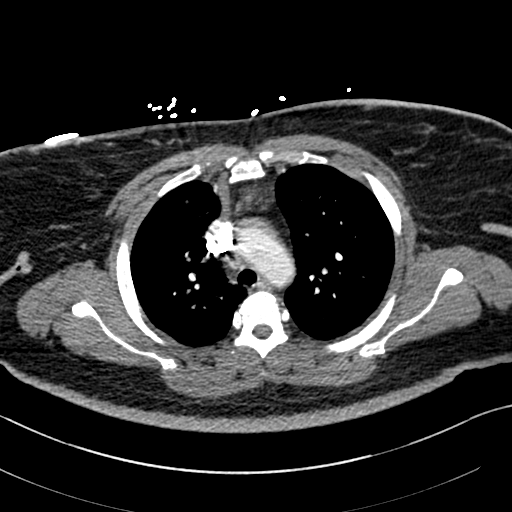
[im 205/243  lung]
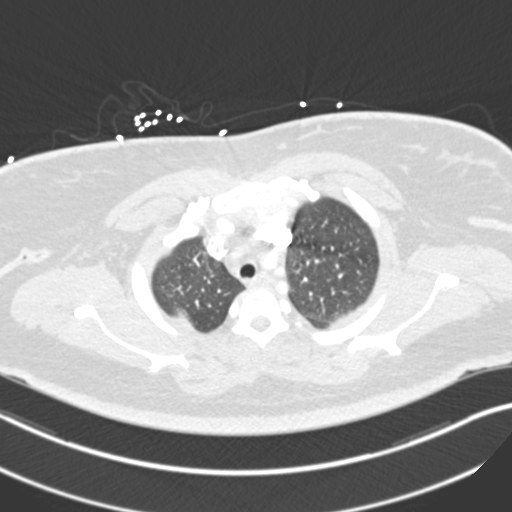
[im 224/243  mediastinal]
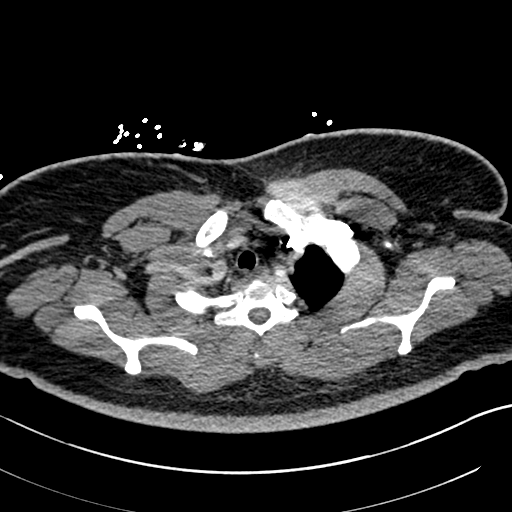

[Series 6: coronal mpr · coronal · 0.61mm/px · 1 of 107 slices shown]
[im 54/107  mediastinal]
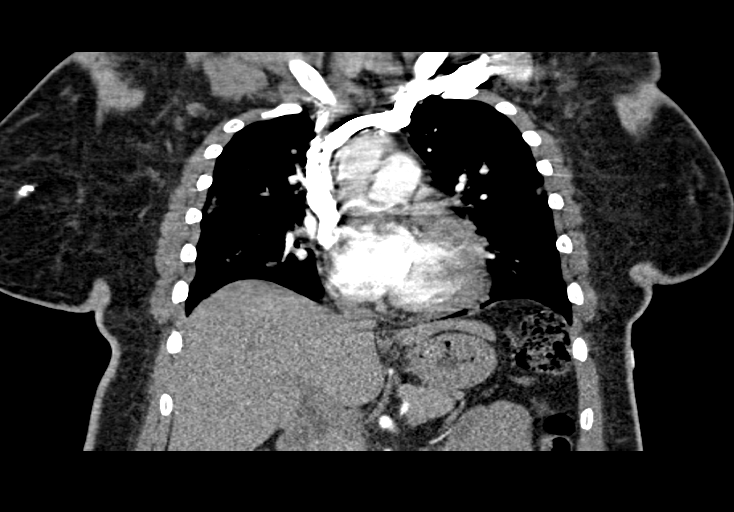

[Series 10: lung · axial · 0.71mm/px · z∈[+1383,+1543]mm · 5 of 120 slices shown]
[im 20/120  mediastinal]
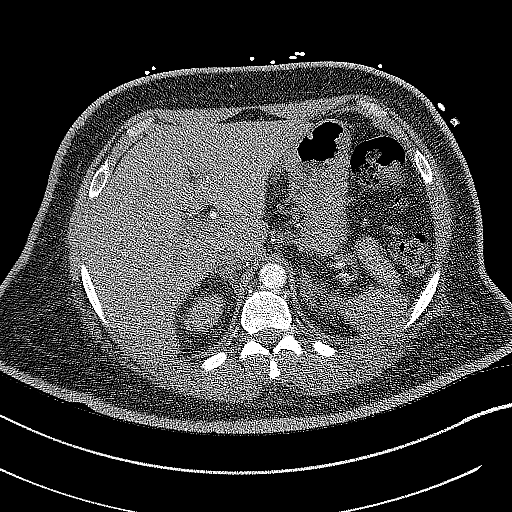
[im 40/120  mediastinal]
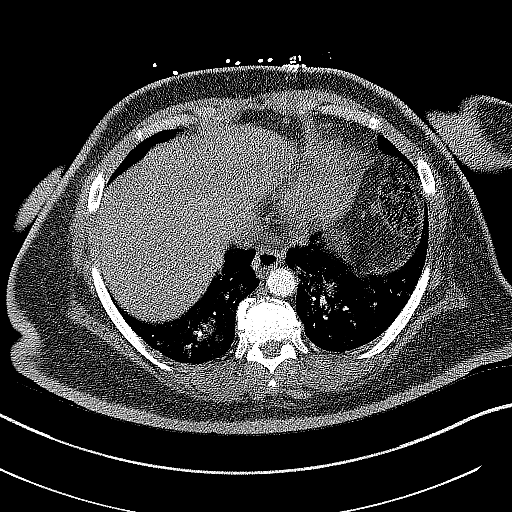
[im 60/120  mediastinal]
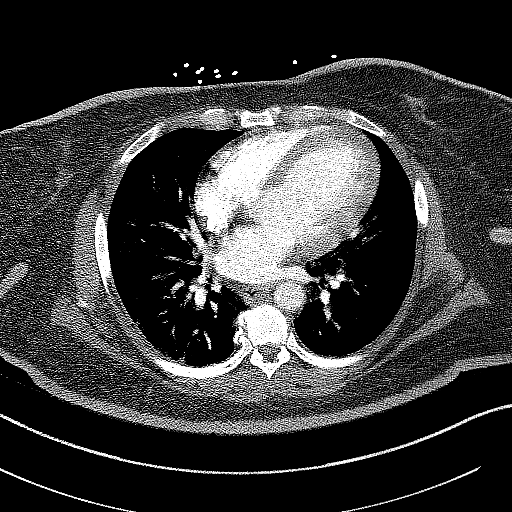
[im 80/120  mediastinal]
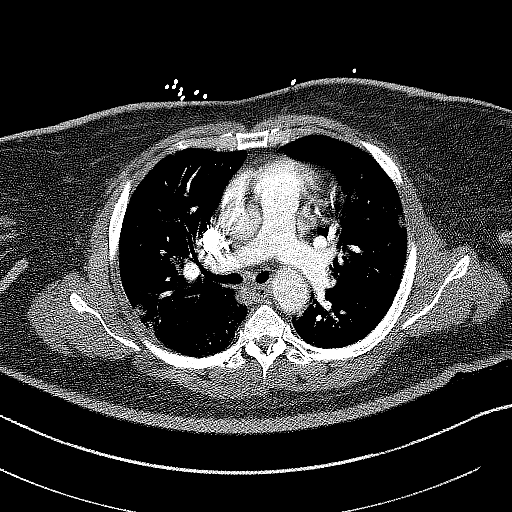
[im 100/120  mediastinal]
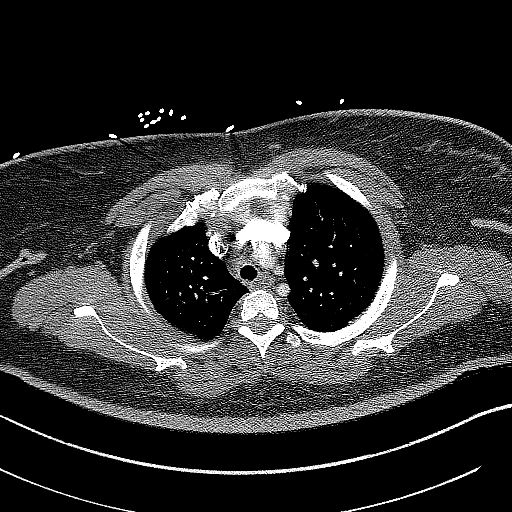

[18 of 36 positions shown; findings below may reference images not displayed]

FINDINGS: Cardiovascular: Satisfactory opacification of the pulmonary arteries
to the proximal segmental level. No evidence of pulmonary embolism.
Mild cardiomegaly. No pericardial effusion.

Mediastinum/Nodes: There are no enlarged lymph nodes. Visualized
thyroid is unremarkable. Esophagus unremarkable.

Lungs/Pleura: Patchy areas of consolidation and ground-glass density
bilaterally with a peripheral and basilar predominance. No
pericardial effusion. No pneumothorax.

Upper Abdomen: No acute abnormality.

Musculoskeletal: No acute osseous abnormality.

Review of the MIP images confirms the above findings.
IMPRESSION: No acute pulmonary embolism.

Multifocal pneumonia consistent with history of BY3E6-JK.

## 2021-08-27 ENCOUNTER — Other Ambulatory Visit: Payer: Self-pay | Admitting: Cardiovascular Disease

## 2021-08-27 DIAGNOSIS — I5022 Chronic systolic (congestive) heart failure: Secondary | ICD-10-CM

## 2021-08-29 MED ORDER — SACUBITRIL-VALSARTAN 97-103 MG PO TABS: 1.0000 | ORAL_TABLET | Freq: Two times a day (BID) | ORAL | 1 refills | Status: DC

## 2021-09-05 ENCOUNTER — Other Ambulatory Visit: Payer: Self-pay | Admitting: Internal Medicine

## 2021-09-05 DIAGNOSIS — E669 Obesity, unspecified: Secondary | ICD-10-CM

## 2021-09-06 ENCOUNTER — Ambulatory Visit (INDEPENDENT_AMBULATORY_CARE_PROVIDER_SITE_OTHER): Payer: BC Managed Care – PPO | Admitting: Orthopaedic Surgery

## 2021-09-06 ENCOUNTER — Encounter: Payer: Self-pay | Admitting: Orthopaedic Surgery

## 2021-09-06 VITALS — Ht 60.0 in | Wt 209.0 lb

## 2021-09-06 DIAGNOSIS — M25551 Pain in right hip: Secondary | ICD-10-CM

## 2021-09-06 NOTE — Progress Notes (Signed)
The patient comes in today for follow-up for her right hip pain.  I felt that her hip pain was more related to trochanteric bursitis.  X-rays showed some mild arthritic changes in her right hip but her hip moves smoothly and fluidly with no pain in the groin at all.  She is obese with a BMI of 40.82.  We will send her to physical therapy to see if there is modalities that they can do to help calm her pain down.  Her last hemoglobin A1c was over 8 3 months ago so we did not recommend any type of steroid injection.  She reports that she is feeling better overall.  Right hip moves smoothly and fluidly with no significant pain.  She is not walking with a limp and there is no significant pain over the trochanteric area.  From my standpoint follow-up can be as needed.  She needs to work on weight loss and stretching exercises.  If her pain gets bad enough and her blood glucose is under better control, we can always recommend a steroid injection.  All questions and concerns were answered and addressed.

## 2021-09-11 ENCOUNTER — Other Ambulatory Visit: Payer: Self-pay | Admitting: Internal Medicine

## 2021-09-11 DIAGNOSIS — K219 Gastro-esophageal reflux disease without esophagitis: Secondary | ICD-10-CM

## 2021-09-12 ENCOUNTER — Ambulatory Visit (INDEPENDENT_AMBULATORY_CARE_PROVIDER_SITE_OTHER): Payer: BC Managed Care – PPO | Admitting: Internal Medicine

## 2021-09-12 ENCOUNTER — Encounter: Payer: Self-pay | Admitting: Internal Medicine

## 2021-09-12 VITALS — BP 133/64 | HR 82 | Temp 98.6°F | Ht 60.0 in | Wt 210.0 lb

## 2021-09-12 DIAGNOSIS — E669 Obesity, unspecified: Secondary | ICD-10-CM

## 2021-09-12 DIAGNOSIS — Z794 Long term (current) use of insulin: Secondary | ICD-10-CM | POA: Diagnosis not present

## 2021-09-12 DIAGNOSIS — Z0000 Encounter for general adult medical examination without abnormal findings: Secondary | ICD-10-CM

## 2021-09-12 DIAGNOSIS — E1169 Type 2 diabetes mellitus with other specified complication: Secondary | ICD-10-CM

## 2021-09-12 DIAGNOSIS — Z6841 Body Mass Index (BMI) 40.0 and over, adult: Secondary | ICD-10-CM

## 2021-09-12 DIAGNOSIS — Z23 Encounter for immunization: Secondary | ICD-10-CM

## 2021-09-12 LAB — POCT GLYCOSYLATED HEMOGLOBIN (HGB A1C): Hemoglobin A1C: 8.6 % — AB (ref 4.0–5.6)

## 2021-09-12 LAB — GLUCOSE, CAPILLARY: Glucose-Capillary: 326 mg/dL — ABNORMAL HIGH (ref 70–99)

## 2021-09-12 MED ORDER — TRULICITY 3 MG/0.5ML ~~LOC~~ SOAJ: 3.0000 mg | SUBCUTANEOUS | 1 refills | Status: DC

## 2021-09-12 MED ORDER — SYNJARDY 5-1000 MG PO TABS: 1.0000 | ORAL_TABLET | Freq: Two times a day (BID) | ORAL | 11 refills | Status: DC

## 2021-09-12 NOTE — Progress Notes (Signed)
CC: diabetes  HPI:  [name] is a 56 y.o. female living with a history stated below and presents today for a 3 month follow up of diabetes. Please see problem based assessment and plan for additional details.  Past Medical History:  Diagnosis Date   Acute pain of left shoulder 06/07/2017   Adenomyosis 06/03/2012   Asthma    as a child   Asthma 11/07/2005   Reported Diagnosis in 2012, no formal spirometry    Cardiomyopathy ([place])    likely nonischemic DCM per Dr. 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 ASCVD risk of 14.5%    LBBB (left bundle branch block)    Left sided numbness 10/15/2019   Menorrhagia    Migraine    Normal CT head (08/06/2006)   Migraine without aura 09/02/2006   Fairly Well-controlled  2 per month  Takes Elitriptan    Nonischemic dilated cardiomyopathy ([place]) 03/20/2017   Perimenopausal menorrhagia 02/20/2012   Perimenopausal menorrhagia 02/20/2012   Psoriasis 04/29/2013   Right foot strain 05/15/2021   Right low back pain  03/02/2014   Routine adult health maintenance 09/27/2010   Swelling of abdominal wall 03/02/2014   Swelling of abdominal wall 03/02/2014   Symptoms of upper respiratory infection (URI) 04/01/2018   Tinnitus 06/01/2020   Viral URI with cough 01/13/2013    Current Outpatient Medications on File Prior to Visit  Medication Sig Dispense Refill   albuterol (VENTOLIN HFA) 108 (90 Base) MCG/ACT inhaler Inhale 2 puffs into the lungs every 6 (six) hours as needed for wheezing or shortness of breath. 8 g 6   aspirin EC 81 MG tablet Take 1 tablet (81 mg total) by mouth daily. Swallow whole. 30 tablet 9   atorvastatin (LIPITOR) 80 MG tablet Take 1 tablet (80 mg total) by mouth daily. 90 tablet 3   eletriptan (RELPAX) 20 MG tablet Take 1 tablet (20 mg total) by mouth as needed for migraine or headache. May repeat in 2 hours if headache persists or recurs. 10 tablet 0   escitalopram (LEXAPRO) 10 MG tablet Take 1 tablet (10 mg total) by mouth daily. 90 tablet 3   fluticasone (FLONASE) 50 MCG/ACT nasal spray Place 1-2 sprays into both nostrils daily as needed for allergies or rhinitis. (Patient not taking: Reported on 03/13/2021)     fluticasone furoate-vilanterol (BREO ELLIPTA) 200-25 MCG/INH AEPB Inhale 1 puff into the lungs daily. (Patient not taking: Reported on 03/13/2021) 60 each 5   Insulin Glargine (BASAGLAR KWIKPEN) 100 UNIT/ML INJECT 23 UNITS SUBCUTANEOUSLY AT BEDTIME 15 mL 0   Insulin Pen Needle (B-D UF III MINI PEN NEEDLES) 31G X 5 MM MISC 1 Stick by Does not apply route daily. 11.69, insulin requirement  30 each 3   metoprolol (TOPROL-XL) 200 MG 24 hr tablet Take 1 tablet by mouth once daily 90 tablet 1   pantoprazole (PROTONIX) 40 MG tablet Take 1 tablet by mouth once daily 90 tablet 0   sacubitril-valsartan (ENTRESTO) 97-103 MG Take 1 tablet by mouth 2 (two) times daily. 180 tablet 1   spironolactone (ALDACTONE) 25 MG tablet Take 1 tablet (25 mg total) by mouth daily. Please keep upcoming appointment in  February 2023 for future refills. Thank you 90 tablet 1   No current facility-administered medications on file prior to visit.    Family History  Problem Relation Age of Onset   Hypertension Father    Diabetes Father     Social History   Socioeconomic History   Marital status: Married    Spouse name: Not on file   Number of children: Not on file   Years of education: Not on file   Highest education level: Not on file  Occupational History   Occupation: Chemical engineer  Tobacco Use   Smoking status: Never   Smokeless tobacco: Never  Vaping Use   Vaping Use: Never used  Substance and Sexual Activity   Alcohol use: Yes    Alcohol/week: 0.0 standard drinks of alcohol    Comment: occ   Drug use: No   Sexual activity: Yes    Birth control/protection: Surgical  Other Topics Concern   Not on file  Social History Narrative   Studying at [employer] in early childhood education.   Married.   Social Determinants of Health   Financial Resource Strain: Not on file  Food Insecurity: Not on file  Transportation Needs: Not on file  Physical Activity: Not on file  Stress: Not on file  Social Connections: Not on file  Intimate Partner Violence: Not on file    Review of Systems: ROS negative except for what is noted on the assessment and plan.  Vitals:   09/12/21 1522  BP: 133/64  Pulse: 82  Temp: 98.6 F (37 C)  TempSrc: Oral  SpO2: 100%  Weight: 210 lb (95.3 kg)  Height: 5' (1.524 m)    Physical Exam: Constitutional: well-appearing obese female sitting in chair, in no acute distress Cardiovascular: regular rate and rhythm, no m/r/g Pulmonary/Chest: normal work of breathing on room air, lungs clear to auscultation bilaterally Abdominal: soft, non-tender, non-distended MSK: normal bulk and tone Neurological: alert & oriented x 3, no focal deficit Skin: warm and dry Psych: normal mood and behavior  Assessment & Plan:     Patient discussed with Dr.  Philipp Ovens  Routine adult health maintenance - tdap updated today  Diabetes mellitus type 2 in obese ([place]) A1c continues to downtrend (9.3% in Feb, 8.9% in April and 8.6% today). The patient takes Basaglar 23 units nightly, Trulicity 1.5 mg weekly, and Synjardy 05-998 mg twice daily.  She states that as of today, her insurance is no longer covering [place], thus we will send in a different formulation of Synjardy that appears to be preferred by her insurance.  The patient checks her blood sugars about once a day in the evenings, and denies any episodes of hypoglycemia or any symptoms of hypoglycemia.  Overall, she feels well and has not had any issues with her medications.  Plan: - Continue Basaglar 23u qhs - Increase Trulicity to 3 mg weekly (can start this dose after she finishes her 1.5 mg pens) - Continue Synjardy 05-998 mg bid - Follow up in 3 months   Atarah Cadogan  Brice Kossman, D.O. [facility], PGY-2 Phone: [phone] Date 09/12/2021 Time 4:00 PM

## 2021-09-12 NOTE — Assessment & Plan Note (Signed)
-   tdap updated today

## 2021-09-12 NOTE — Patient Instructions (Signed)
Thank you, Sandra [name] for allowing [name] to provide your care today. Today we discussed:  Diabetes: Your A1c continues to get better each visit! Today it is 8.6% (was 8.9%) and our goal is to get you to 7%. Keep taking your Synjardy twice a day, your basaglar 23 units each night, and we are now increasing your Trulicity to 3 mg per week. You can finish the 1.5 mg Trulicity pens you have, and then go to the higher dose  Follow up in 3 months for another diabetes check   We also updated your tetanus booster today!  I have ordered the following labs for you:   Lab Orders         Glucose, capillary         POC Hbg A1C       Referrals ordered today:   Referral Orders  No referral(s) requested today     I have ordered the following medication/changed the following medications:   Stop the following medications: Medications Discontinued During This Encounter  Medication Reason   SYNJARDY XR 05-998 MG TB24      Start the following medications: Meds ordered this encounter  Medications   Empagliflozin-metFORMIN HCl (SYNJARDY) 05-998 MG TABS    Sig: Take 1 tablet by mouth 2 (two) times daily.    Dispense:  60 tablet    Refill:  11   Dulaglutide (TRULICITY) 3 SW/1.0XN SOPN    Sig: Inject 3 mg as directed once a week.    Dispense:  3 mL    Refill:  1     Follow up: 3 months     Should you have any questions or concerns please call the internal medicine clinic at [phone].     Buddy Duty, D.O. [place]

## 2021-09-12 NOTE — Telephone Encounter (Signed)
Pt has an appt today with Dr Raymondo Band.

## 2021-09-12 NOTE — Assessment & Plan Note (Signed)
A1c continues to downtrend (9.3% in Feb, 8.9% in April and 8.6% today). The patient takes Basaglar 23 units nightly, Trulicity 1.5 mg weekly, and Synjardy 05-998 mg twice daily.  She states that as of today, her insurance is no longer covering [place], thus we will send in a different formulation of Synjardy that appears to be preferred by her insurance.  The patient checks her blood sugars about once a day in the evenings, and denies any episodes of hypoglycemia or any symptoms of hypoglycemia.  Overall, she feels well and has not had any issues with her medications.  Plan: - Continue Basaglar 23u qhs - Increase Trulicity to 3 mg weekly (can start this dose after she finishes her 1.5 mg pens) - Continue Synjardy 05-998 mg bid - Follow up in 3 months

## 2021-09-13 ENCOUNTER — Encounter: Payer: Self-pay | Admitting: Internal Medicine

## 2021-09-14 ENCOUNTER — Encounter: Payer: BC Managed Care – PPO | Admitting: Internal Medicine

## 2021-09-14 ENCOUNTER — Other Ambulatory Visit: Payer: Self-pay | Admitting: *Deleted

## 2021-09-14 ENCOUNTER — Telehealth: Payer: Self-pay

## 2021-09-14 DIAGNOSIS — E1169 Type 2 diabetes mellitus with other specified complication: Secondary | ICD-10-CM

## 2021-09-14 NOTE — Telephone Encounter (Signed)
Requesting to speak with a nurse about Empagliflozin-metFORMIN HCl (SYNJARDY) 05-998 MG TABS. Please call pt back.

## 2021-09-14 NOTE — Telephone Encounter (Signed)
Call to pharmacy concerning Synjardy.  Patient can pick up medication in 30 minutes.  Was unavailable when she picked up meds on yesterday.

## 2021-09-14 NOTE — Progress Notes (Signed)
Internal Medicine Clinic Attending ° °Case discussed with Dr. Atway  At the time of the visit.  We reviewed the resident’s history and exam and pertinent patient test results.  I agree with the assessment, diagnosis, and plan of care documented in the resident’s note.  °

## 2021-10-17 ENCOUNTER — Emergency Department (HOSPITAL_COMMUNITY)
Admission: EM | Admit: 2021-10-17 | Discharge: 2021-10-17 | Disposition: A | Discharge status: Home / Self Care | Payer: BC Managed Care – PPO | Attending: Emergency Medicine | Admitting: Emergency Medicine

## 2021-10-17 ENCOUNTER — Other Ambulatory Visit: Payer: Self-pay

## 2021-10-17 DIAGNOSIS — M545 Low back pain, unspecified: Secondary | ICD-10-CM | POA: Diagnosis present

## 2021-10-17 DIAGNOSIS — Z79899 Other long term (current) drug therapy: Secondary | ICD-10-CM | POA: Insufficient documentation

## 2021-10-17 DIAGNOSIS — Z794 Long term (current) use of insulin: Secondary | ICD-10-CM | POA: Diagnosis not present

## 2021-10-17 DIAGNOSIS — R519 Headache, unspecified: Secondary | ICD-10-CM | POA: Insufficient documentation

## 2021-10-17 DIAGNOSIS — N3000 Acute cystitis without hematuria: Secondary | ICD-10-CM

## 2021-10-17 DIAGNOSIS — Z7982 Long term (current) use of aspirin: Secondary | ICD-10-CM | POA: Insufficient documentation

## 2021-10-17 DIAGNOSIS — R197 Diarrhea, unspecified: Secondary | ICD-10-CM | POA: Diagnosis not present

## 2021-10-17 LAB — CBC
HCT: 33 % — ABNORMAL LOW (ref 36.0–46.0)
Hemoglobin: 11 g/dL — ABNORMAL LOW (ref 12.0–15.0)
MCH: 33.6 pg (ref 26.0–34.0)
MCHC: 33.3 g/dL (ref 30.0–36.0)
MCV: 100.9 fL — ABNORMAL HIGH (ref 80.0–100.0)
Platelets: 277 10*3/uL (ref 150–400)
RBC: 3.27 MIL/uL — ABNORMAL LOW (ref 3.87–5.11)
RDW: 13.2 % (ref 11.5–15.5)
WBC: 4 10*3/uL (ref 4.0–10.5)
nRBC: 0 % (ref 0.0–0.2)

## 2021-10-17 LAB — URINALYSIS, ROUTINE W REFLEX MICROSCOPIC
Bilirubin Urine: NEGATIVE
Glucose, UA: >=500 mg/dL — AB
Hgb urine dipstick: NEGATIVE
Ketones, ur: NEGATIVE mg/dL
Leukocytes,Ua: NEGATIVE
Nitrite: NEGATIVE
Protein, ur: NEGATIVE mg/dL
Specific Gravity, Urine: 1.01 (ref 1.005–1.030)
pH: 6.5 (ref 5.0–8.0)

## 2021-10-17 LAB — URINALYSIS, MICROSCOPIC (REFLEX): RBC / HPF: NONE SEEN RBC/hpf (ref 0–5)

## 2021-10-17 LAB — COMPREHENSIVE METABOLIC PANEL
ALT: 27 U/L (ref 0–44)
AST: 24 U/L (ref 15–41)
Albumin: 3.7 g/dL (ref 3.5–5.0)
Alkaline Phosphatase: 79 U/L (ref 38–126)
Anion gap: 9 (ref 5–15)
BUN: 8 mg/dL (ref 6–20)
CO2: 25 mmol/L (ref 22–32)
Calcium: 9.2 mg/dL (ref 8.9–10.3)
Chloride: 105 mmol/L (ref 98–111)
Creatinine, Ser: 0.56 mg/dL (ref 0.44–1.00)
GFR, Estimated: >60 mL/min (ref >60)
Glucose, Bld: 236 mg/dL — ABNORMAL HIGH (ref 70–99)
Potassium: 4 mmol/L (ref 3.5–5.1)
Sodium: 139 mmol/L (ref 135–145)
Total Bilirubin: 0.5 mg/dL (ref 0.3–1.2)
Total Protein: 6.7 g/dL (ref 6.5–8.1)

## 2021-10-17 LAB — LIPASE, BLOOD: Lipase: 32 U/L (ref 11–51)

## 2021-10-17 MED ORDER — NAPROXEN 375 MG PO TABS: 375.0000 mg | ORAL_TABLET | Freq: Two times a day (BID) | ORAL | 0 refills | Status: DC

## 2021-10-17 MED ORDER — ONDANSETRON 4 MG PO TBDP
4.0000 mg | ORAL_TABLET | Freq: Once | ORAL | Status: AC
  Administered 2021-10-17: 4 mg via ORAL
  Filled 2021-10-17: qty 1

## 2021-10-17 MED ORDER — KETOROLAC TROMETHAMINE 30 MG/ML IJ SOLN
30.0000 mg | Freq: Once | INTRAMUSCULAR | Status: AC
  Administered 2021-10-17: 30 mg via INTRAMUSCULAR
  Filled 2021-10-17: qty 1

## 2021-10-17 MED ORDER — CEPHALEXIN 500 MG PO CAPS: 500.0000 mg | ORAL_CAPSULE | Freq: Four times a day (QID) | ORAL | 0 refills | Status: DC

## 2021-10-17 NOTE — Discharge Instructions (Addendum)
Take medication as directed. Please follow-up with your provider in the internal medicine clinic.

## 2021-10-17 NOTE — ED Triage Notes (Signed)
Pt. Stated, Ive had high BP, back pain with N/V/D. Back pain for 2 weeks , the high BP and N/V/D started this morning.

## 2021-10-17 NOTE — ED Provider Notes (Signed)
[facility] EMERGENCY DEPARTMENT Provider Note   CSN: [id number] Arrival date & time: 10/17/21  0755     History  Chief Complaint  Patient presents with   Back Pain   Hypertension   Emesis   Nausea   Diarrhea    Sandra [name] is a 56 y.o. female.  Patient reports low right sided back pain for 2 weeks that radiates into the right flank. It seems to be worse at night, and immediately upon arising for the day. Not worsened by activity. She also reports a generalized headache this morning, along with nausea and vomiting. She noted that her blood pressure was 160/90. Patient received zofran upon arrival in ED, with resolution of nausea and vomiting. Patient has been able to eat and drink without difficulty. She denies dizziness, visual disturbance, disequilibrium, extremity weakness.   Back Pain Location:  Lumbar spine Quality:  Stiffness Radiates to: right flank. Pain severity:  Moderate Pain is:  Worse during the night Duration:  2 weeks Timing:  Intermittent Progression:  Waxing and waning Context: not recent injury   Ineffective treatments:  Ibuprofen, heating pad and muscle relaxants Associated symptoms: headaches   Associated symptoms: no abdominal pain, no dysuria, no fever, no numbness and no weakness   Hypertension Associated symptoms include headaches. Pertinent negatives include no abdominal pain.  Emesis Associated symptoms: diarrhea and headaches   Associated symptoms: no abdominal pain, no chills and no fever   Diarrhea Associated symptoms: headaches and vomiting   Associated symptoms: no abdominal pain, no chills and no fever        Home Medications Prior to Admission medications   Medication Sig Start Date End Date Taking? Authorizing Provider  albuterol (VENTOLIN HFA) 108 (90 Base) MCG/ACT inhaler Inhale 2 puffs into the lungs every 6 (six) hours as needed for wheezing or shortness of breath. 02/23/21   Atway, Jeananne Rama, DO  aspirin  EC 81 MG tablet Take 1 tablet (81 mg total) by mouth daily. Swallow whole. 02/23/21   Atway, Rayann N, DO  atorvastatin (LIPITOR) 80 MG tablet Take 1 tablet (80 mg total) by mouth daily. 02/23/21   Atway, Rayann N, DO  Dulaglutide (TRULICITY) 3 WU/9.8JX SOPN Inject 3 mg as directed once a week. 09/12/21   Atway, Rayann N, DO  eletriptan (RELPAX) 20 MG tablet Take 1 tablet (20 mg total) by mouth as needed for migraine or headache. May repeat in 2 hours if headache persists or recurs. 02/23/20   Andrew Au, MD  Empagliflozin-metFORMIN HCl (SYNJARDY) 05-998 MG TABS Take 1 tablet by mouth 2 (two) times daily. 09/12/21   Atway, Rayann N, DO  escitalopram (LEXAPRO) 10 MG tablet Take 1 tablet (10 mg total) by mouth daily. 02/23/21   Atway, Rayann N, DO  fluticasone (FLONASE) 50 MCG/ACT nasal spray Place 1-2 sprays into both nostrils daily as needed for allergies or rhinitis. Patient not taking: Reported on 03/13/2021    [provider]  fluticasone furoate-vilanterol (BREO ELLIPTA) 200-25 MCG/INH AEPB Inhale 1 puff into the lungs daily. Patient not taking: Reported on 03/13/2021 01/18/20   June Leap L, DO  Insulin Glargine (BASAGLAR KWIKPEN) 100 UNIT/ML INJECT 23 UNITS SUBCUTANEOUSLY AT BEDTIME 08/14/21   Atway, Rayann N, DO  Insulin Pen Needle (B-D UF III MINI PEN NEEDLES) 31G X 5 MM MISC 1 Stick by Does not apply route daily. 11.69, insulin requirement 05/15/21   Atway, Rayann N, DO  metoprolol (TOPROL-XL) 200 MG 24 hr tablet Take 1  tablet by mouth once daily 07/10/21   Atway, Rayann N, DO  pantoprazole (PROTONIX) 40 MG tablet Take 1 tablet by mouth once daily 09/12/21   Atway, Rayann N, DO  sacubitril-valsartan (ENTRESTO) 97-103 MG Take 1 tablet by mouth 2 (two) times daily. 08/29/21   Josue Hector, MD  spironolactone (ALDACTONE) 25 MG tablet Take 1 tablet (25 mg total) by mouth daily. Please keep upcoming appointment in February 2023 for future refills. Thank you 05/15/21   Dorethea Clan, DO       Allergies    Drug class [trazodone and nefazodone] and Sertraline hcl    Review of Systems   Review of Systems  Constitutional:  Negative for chills and fever.  Eyes:  Negative for photophobia and visual disturbance.  Gastrointestinal:  Positive for diarrhea and vomiting. Negative for abdominal pain.  Genitourinary:  Negative for dysuria.  Musculoskeletal:  Positive for back pain.  Neurological:  Positive for headaches. Negative for dizziness, speech difficulty, weakness, light-headedness and numbness.  All other systems reviewed and are negative.   Physical Exam Updated Vital Signs BP (!) 145/80 (BP Location: Left Arm)   Pulse 75   Temp 98.6 F (37 C) (Oral)   Resp 17   Ht 5' (1.524 m)   Wt 92.1 kg   SpO2 99%   BMI 39.65 kg/m  Physical Exam Vitals and nursing note reviewed.  Constitutional:      Appearance: Normal appearance.  HENT:     Head: Normocephalic.     Nose: Nose normal.     Mouth/Throat:     Mouth: Mucous membranes are moist.  Eyes:     Extraocular Movements: Extraocular movements intact.     Conjunctiva/sclera: Conjunctivae normal.  Cardiovascular:     Rate and Rhythm: Normal rate and regular rhythm.  Pulmonary:     Effort: Pulmonary effort is normal.     Breath sounds: Normal breath sounds.  Abdominal:     General: There is no distension.     Palpations: Abdomen is soft.     Tenderness: There is no abdominal tenderness.  Musculoskeletal:        General: Normal range of motion.  Skin:    General: Skin is warm and dry.  Neurological:     General: No focal deficit present.     Mental Status: She is alert and oriented to person, place, and time.  Psychiatric:        Mood and Affect: Mood normal.        Behavior: Behavior normal.     ED Results / Procedures / Treatments   Labs (all labs ordered are listed, but only abnormal results are displayed) Labs Reviewed  COMPREHENSIVE METABOLIC PANEL - Abnormal; Notable for the following components:       Result Value   Glucose, Bld 236 (*)    All other components within normal limits  CBC - Abnormal; Notable for the following components:   RBC 3.27 (*)    Hemoglobin 11.0 (*)    HCT 33.0 (*)    MCV 100.9 (*)    All other components within normal limits  URINALYSIS, ROUTINE W REFLEX MICROSCOPIC - Abnormal; Notable for the following components:   Glucose, UA >=500 (*)    All other components within normal limits  URINALYSIS, MICROSCOPIC (REFLEX) - Abnormal; Notable for the following components:   Bacteria, UA MANY (*)    All other components within normal limits  LIPASE, BLOOD    EKG None  Radiology  No results found.  Procedures Procedures    Medications Ordered in ED Medications  ondansetron (ZOFRAN-ODT) disintegrating tablet 4 mg (4 mg Oral Given 10/17/21 1243)  ketorolac (TORADOL) 30 MG/ML injection 30 mg (30 mg Intramuscular Given 10/17/21 1721)    ED Course/ Medical Decision Making/ A&P                           Medical Decision Making Amount and/or Complexity of Data Reviewed Labs: ordered.  Labs reviewed and are reassuring. Chronic anemia. Glycosuria and bacteruria.   Pt diagnosed with a UTI. Pt is afebrile, without tachycardia, hypotension, or other signs of serious infection.  Pt to be dc home with antibiotics and instructions to follow up with PCP if symptoms persist. Discussed return precautions. Pt appears safe for discharge.   Patient with back pain.  No neurological deficits and normal neuro exam.  Patient is ambulatory.  No loss of bowel or bladder control.  No concern for cauda equina.  No fever, night sweats, weight loss, h/o cancer, IVDA, no recent procedure to back. UA indicative of bacteruria. Supportive care and return precaution discussed.           Final Clinical Impression(s) / ED Diagnoses Final diagnoses:  Chronic right-sided low back pain without sciatica  Acute cystitis without hematuria    Rx / DC Orders ED Discharge Orders      None         Etta Quill, NP 10/17/21 1746    Regan Lemming, MD 10/17/21 2037

## 2021-10-31 ENCOUNTER — Ambulatory Visit (INDEPENDENT_AMBULATORY_CARE_PROVIDER_SITE_OTHER): Payer: BC Managed Care – PPO

## 2021-10-31 VITALS — BP 128/70 | HR 82 | Temp 98.2°F | Ht 60.0 in | Wt 205.2 lb

## 2021-10-31 DIAGNOSIS — R109 Unspecified abdominal pain: Secondary | ICD-10-CM | POA: Insufficient documentation

## 2021-10-31 DIAGNOSIS — I10 Essential (primary) hypertension: Secondary | ICD-10-CM

## 2021-10-31 DIAGNOSIS — R1031 Right lower quadrant pain: Secondary | ICD-10-CM

## 2021-10-31 DIAGNOSIS — E669 Obesity, unspecified: Secondary | ICD-10-CM

## 2021-10-31 DIAGNOSIS — E119 Type 2 diabetes mellitus without complications: Secondary | ICD-10-CM

## 2021-10-31 DIAGNOSIS — E1169 Type 2 diabetes mellitus with other specified complication: Secondary | ICD-10-CM | POA: Diagnosis not present

## 2021-10-31 DIAGNOSIS — Z6841 Body Mass Index (BMI) 40.0 and over, adult: Secondary | ICD-10-CM

## 2021-10-31 DIAGNOSIS — Z794 Long term (current) use of insulin: Secondary | ICD-10-CM

## 2021-10-31 HISTORY — DX: Unspecified abdominal pain: R10.9

## 2021-10-31 MED ORDER — OXYCODONE HCL 5 MG PO TABS: 5.0000 mg | ORAL_TABLET | Freq: Three times a day (TID) | ORAL | 0 refills | Status: DC | PRN

## 2021-10-31 NOTE — Assessment & Plan Note (Signed)
Current medications include Basaglar (23 units daily), Synjardy 04-1636 MG BID, and Trulicity 3 mg once weekly. Patient states that she is compliant with this medication. Patient denies polyuria, polydipsia, fatigue. A1c was 8.6 in 08/2021. Patient states that she does visit the ophthalmologist for yearly eye exams.   Plan: - Urine ACR today - Continue Basaglar (23 units daily), Synjardy 04-5362 MG BID, and Trulicity 3 mg once weekly

## 2021-10-31 NOTE — Assessment & Plan Note (Signed)
Current medications include sacubitril-valsartan 97-103 MG (BID), metoprolol 200 MG, and spironolactone 25 MG. Patient states that she is compliant with these medications. Patient states that she does check her BP regularly at home. Patient endorses intermittent HA,  lightheadedness, and SOB over the last 2-3 weeks. Patient denies dizziness. Initial BP today is 141/76. Repeat BP is 128/70.    Plan: - Continue sacubitril-valsartan 97-103 MG (BID), metoprolol 200 MG, and spironolactone 25 MG

## 2021-10-31 NOTE — Assessment & Plan Note (Signed)
Patient presented to the ED on 9/26 for 2 weeks of right-sided lower back pain that radiated to the right flank associated w/ nausea, vomiting, and generalized HA. Patient had many bacteria on UA and was diagnosed with UTI/acute cystitis. Patient was prescribed cephALEXin 500 MG QID for 5 days and she completed this course of antibiotics. Patient is complaining of worsening right-sided back/flank pain that is worse in the morning and improves throughout the day. She states that the pain worsens with movement and coughing. She also endorses urinary frequency and intermittent nausea for 2-3 weeks. Patient states that she has tried ibuprofen and a heating pad without relief. Denies fever, chills, dysuria, hematuria, vaginal discharge, vomiting, or abdominal pain. Patient endorses a history of kidney stones roughly 20 years ago that required lithotripsy. Patient states that these symptoms are similar to that time. Patient also endorses intermittent diarrhea over the last month. Patient denies recent falls or trauma. Patient denies history of gonorrhea or chlamydia. Patient has right-sided CVA tenderness on exam and is afebrile. Patient presentation is concerning for potential nephrolithiasis. Would like to order a CT to assess for kidney stones.   Plan: - Non-contrast CT abdomen pelvis ordered  - Prescribed Oxycodone 5 mg (10 tablets) for pain

## 2021-10-31 NOTE — Progress Notes (Signed)
amp  CC: ED f/u visit  HPI:  Sandra [name] is a 56 y.o. female with past medical history of HTN, HLD, HFrEF, non-ischemic dilated cardiomyopathy, T2DM, migraine w/o aura, asthma, GAD, and GERD that presents for an ED f/u visit.   Patient presented to the ED on 9/26 for 2 weeks of right-sided lower back pain that radiated to the right flank associated w/ nausea, vomiting, and generalized HA. Patient had many bacteria on UA and was diagnosed with UTI/acute cystitis. Patient was prescribed cephALEXin 500 MG QID for 5 days and she completed this course of antibiotics. Patient is complaining of worsening right-sided back/flank pain that is worse in the morning and improves throughout the day. She states that the pain worsens with movement and coughing. She also endorses urinary frequency and intermittent nausea for 2-3 weeks. Patient states that she has tried ibuprofen and a heating pad without relief. Denies fever, chills, dysuria, hematuria, vaginal discharge, vomiting, or abdominal pain. Patient endorses a history of kidney stones roughly 20 years ago that required lithotripsy. Patient states that these symptoms are similar to that time. Patient denies recent falls or trauma. Patient denies history of gonorrhea or chlamydia.   Patient has a history of type 2 diabetes. Current medications include Basaglar (23 units daily), Synjardy 02-5051 MG BID, and Trulicity 3 mg once weekly. Patient states that she is compliant with this medication. Patient denies polyuria, polydipsia, fatigue. A1c was 8.6 in 08/2021. Patient states that she does visit the ophthalmologist for yearly eye exams.   Patient also has a history of hypertension. Current medications include sacubitril-valsartan 97-103 MG (BID), metoprolol 200 MG, and spironolactone 25 MG. Patient states that she is compliant with these medications. Patient states that she does check her BP regularly at home. Patient endorses intermittent HA,   lightheadedness, and SOB over the last 2-3 weeks. Patient denies dizziness. Initial BP today is 141/76. Repeat BP is 128/70.    Patient would not like the influenza vaccine today.    Allergies as of 10/31/2021       Reactions   Drug Class Lourena Simmonds And Nefazodone]    Developed suicidal ideation within 3 days of starting   Sertraline Hcl    Developed suicidal ideation within 3d of starting        Medication List        Accurate as of October 31, 2021  7:04 AM. If you have any questions, ask your nurse or doctor.          albuterol 108 (90 Base) MCG/ACT inhaler Commonly known as: VENTOLIN HFA Inhale 2 puffs into the lungs every 6 (six) hours as needed for wheezing or shortness of breath.   aspirin EC 81 MG tablet Take 1 tablet (81 mg total) by mouth daily. Swallow whole.   atorvastatin 80 MG tablet Commonly known as: LIPITOR Take 1 tablet (80 mg total) by mouth daily.   B-D UF III MINI PEN NEEDLES 31G X 5 MM Misc Generic drug: Insulin Pen Needle 1 Stick by Does not apply route daily. 11.69, insulin requirement   Basaglar KwikPen 100 UNIT/ML INJECT 23 UNITS SUBCUTANEOUSLY AT BEDTIME   Breo Ellipta 200-25 MCG/ACT Aepb Generic drug: fluticasone furoate-vilanterol Inhale 1 puff into the lungs daily.   cephALEXin 500 MG capsule Commonly known as: KEFLEX Take 1 capsule (500 mg total) by mouth 4 (four) times daily.   eletriptan 20 MG tablet Commonly known as: RELPAX Take 1 tablet (20 mg total) by mouth as needed for migraine  or headache. May repeat in 2 hours if headache persists or recurs.   escitalopram 10 MG tablet Commonly known as: LEXAPRO Take 1 tablet (10 mg total) by mouth daily.   fluticasone 50 MCG/ACT nasal spray Commonly known as: FLONASE Place 1-2 sprays into both nostrils daily as needed for allergies or rhinitis.   metoprolol 200 MG 24 hr tablet Commonly known as: TOPROL-XL Take 1 tablet by mouth once daily   naproxen 375 MG tablet Commonly  known as: NAPROSYN Take 1 tablet (375 mg total) by mouth 2 (two) times daily.   pantoprazole 40 MG tablet Commonly known as: PROTONIX Take 1 tablet by mouth once daily   sacubitril-valsartan 97-103 MG Commonly known as: ENTRESTO Take 1 tablet by mouth 2 (two) times daily.   spironolactone 25 MG tablet Commonly known as: ALDACTONE Take 1 tablet (25 mg total) by mouth daily. Please keep upcoming appointment in February 2023 for future refills. Thank you   Synjardy 05-998 MG Tabs Generic drug: Empagliflozin-metFORMIN HCl Take 1 tablet by mouth 2 (two) times daily.   Trulicity 3 JK/0.9FG Sopn Generic drug: Dulaglutide Inject 3 mg as directed once a week.         Past Medical History:  Diagnosis Date   Acute pain of left shoulder 06/07/2017   Adenomyosis 06/03/2012   Asthma    as a child   Asthma 11/07/2005   Reported Diagnosis in 2012, no formal spirometry    Cardiomyopathy ([place])    likely nonischemic DCM per Dr. 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   Physical Exam: Vitals:   10/31/21 1516  BP: (!) 141/76  Pulse: 81  Temp: 98.2 F (36.8 C)  TempSrc: Oral  SpO2: 97%  Weight: 205 lb 3.2 oz (93.1 kg)  Height: 5' (1.524 m)   Constitutional: appears uncomfortable Cardiovascular: Regular rate, regular rhythm. No murmurs, rubs, or  gallops. Normal radial and PT pulses bilaterally. No LE edema.  Pulmonary: Normal respiratory effort. No wheezes, rales, or rhonchi.   Abdominal: Normal bowel sounds.  Musculoskeletal: Normal range of motion. CVA tenderness on the right. No midline spinal tenderness.  Neurological: Alert and oriented to person, place, and time. Non-focal. Skin: warm and dry. No rash.   Assessment & Plan:   See Encounters Tab for problem based charting.  Patient seen with Dr. Evette Doffing

## 2021-10-31 NOTE — Patient Instructions (Addendum)
Thank you for coming to see [name] in clinic Sandra [name].   Plan: - We ordered a CT scan for you to check for kidney stones (you will be called about this) - We will prescribe you oxycodone for your pain - We got your urine today to check your kidney function related to your diabetes (I will call you with these results)  It was very nice to meet you.

## 2021-11-01 ENCOUNTER — Encounter (HOSPITAL_COMMUNITY): Payer: Self-pay

## 2021-11-01 ENCOUNTER — Ambulatory Visit (HOSPITAL_COMMUNITY)
Admission: RE | Admit: 2021-11-01 | Discharge: 2021-11-01 | Disposition: A | Discharge status: Home / Self Care | Payer: BC Managed Care – PPO | Source: Ambulatory Visit | Attending: Student in an Organized Health Care Education/Training Program | Admitting: Student in an Organized Health Care Education/Training Program

## 2021-11-01 DIAGNOSIS — R1031 Right lower quadrant pain: Secondary | ICD-10-CM | POA: Diagnosis present

## 2021-11-01 LAB — MICROALBUMIN / CREATININE URINE RATIO
Creatinine, Urine: 62.6 mg/dL
Microalb/Creat Ratio: 46 mg/g creat — ABNORMAL HIGH (ref 0–29)
Microalbumin, Urine: 28.7 ug/mL

## 2021-11-01 NOTE — Progress Notes (Signed)
Internal Medicine Clinic Attending  I saw and evaluated the patient.  I personally confirmed the key portions of the history and exam documented by Dr. Alton Revere and I reviewed pertinent patient test results.  The assessment, diagnosis, and plan were formulated together and I agree with the documentation in the resident's note.   Clinical exam consistent with renal colic from nephrolithiasis, but CT abdomen ruled this out. With normal recent labs and normal imaging, seems like a good chance this may be a self-limited low back pain. Will treat for a few days with supportive care, oxycodone for pain control, monitor oral intake, and follow up with [name] by phone by the end of the week.

## 2021-11-01 NOTE — Progress Notes (Signed)
I called the patient and updated her on the results of her microalbumin/creatinine ratio. Discussed the importance of continuing with her current diabetic regimen as well as decreasing intake of foods with high sugar content.

## 2021-11-03 NOTE — Progress Notes (Signed)
Patient called and updated on normal results of CT. Discussed continuing with conservative therapy and oxycodone for her pain at this time. Will call and reassess her symptoms.

## 2021-11-14 ENCOUNTER — Other Ambulatory Visit: Payer: Self-pay | Admitting: Internal Medicine

## 2021-11-14 DIAGNOSIS — E1169 Type 2 diabetes mellitus with other specified complication: Secondary | ICD-10-CM

## 2021-12-08 ENCOUNTER — Other Ambulatory Visit: Payer: Self-pay | Admitting: Internal Medicine

## 2021-12-08 DIAGNOSIS — K219 Gastro-esophageal reflux disease without esophagitis: Secondary | ICD-10-CM

## 2021-12-11 ENCOUNTER — Encounter: Payer: BC Managed Care – PPO | Admitting: Internal Medicine

## 2021-12-11 ENCOUNTER — Encounter: Payer: Self-pay | Admitting: Internal Medicine

## 2021-12-11 NOTE — Progress Notes (Deleted)
CC: diabetes  HPI:  [name] is a 56 y.o. female living with a history stated below and presents today for ***. Please see problem based assessment and plan for additional details.  Past Medical History:  Diagnosis Date   Acute pain of left shoulder 06/07/2017   Adenomyosis 06/03/2012   Asthma    as a child   Asthma 11/07/2005   Reported Diagnosis in 2012, no formal spirometry    Cardiomyopathy ([place])    likely nonischemic DCM per Dr. 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   Perimenopausal menorrhagia 02/20/2012   Perimenopausal menorrhagia 02/20/2012   Psoriasis 04/29/2013   Right foot strain 05/15/2021    Right low back pain 03/02/2014   Routine adult health maintenance 09/27/2010   Swelling of abdominal wall 03/02/2014   Swelling of abdominal wall 03/02/2014   Symptoms of upper respiratory infection (URI) 04/01/2018   Tinnitus 06/01/2020   Viral URI with cough 01/13/2013    Current Outpatient Medications on File Prior to Visit  Medication Sig Dispense Refill   albuterol (VENTOLIN HFA) 108 (90 Base) MCG/ACT inhaler Inhale 2 puffs into the lungs every 6 (six) hours as needed for wheezing or shortness of breath. 8 g 6   aspirin EC 81 MG tablet Take 1 tablet (81 mg total) by mouth daily. Swallow whole. 30 tablet 9   atorvastatin (LIPITOR) 80 MG tablet Take 1 tablet (80 mg total) by mouth daily. 90 tablet 3   cephALEXin (KEFLEX) 500 MG capsule Take 1 capsule (500 mg total) by mouth 4 (four) times daily. 20 capsule 0   eletriptan (RELPAX) 20 MG tablet Take 1 tablet (20 mg total) by mouth as needed for migraine or headache. May repeat in 2 hours if headache persists or recurs. 10 tablet 0   Empagliflozin-metFORMIN HCl (SYNJARDY) 05-998 MG TABS Take 1 tablet by mouth 2 (two) times daily. 60 tablet 11   escitalopram (LEXAPRO) 10 MG tablet Take 1 tablet (10 mg total) by mouth daily. 90 tablet 3   fluticasone (FLONASE) 50 MCG/ACT nasal spray Place 1-2 sprays into both nostrils daily as needed for allergies or rhinitis. (Patient not taking: Reported on 03/13/2021)     fluticasone furoate-vilanterol (BREO ELLIPTA) 200-25 MCG/INH AEPB Inhale 1 puff into the lungs daily. (Patient not taking: Reported on 03/13/2021) 60  each 5   Insulin Glargine (BASAGLAR KWIKPEN) 100 UNIT/ML INJECT 23 UNITS SUBCUTANEOUSLY AT BEDTIME 15 mL 0   Insulin Pen Needle (B-D UF III MINI PEN NEEDLES) 31G X 5 MM MISC 1 Stick by Does not apply route daily. 11.69, insulin requirement 30 each 3   metoprolol (TOPROL-XL) 200 MG 24 hr tablet Take 1 tablet by mouth once daily 90 tablet 1   naproxen (NAPROSYN) 375 MG tablet Take 1 tablet (375 mg total) by  mouth 2 (two) times daily. 20 tablet 0   oxyCODONE (ROXICODONE) 5 MG immediate release tablet Take 1 tablet (5 mg total) by mouth every 8 (eight) hours as needed. 10 tablet 0   pantoprazole (PROTONIX) 40 MG tablet Take 1 tablet by mouth once daily 90 tablet 0   sacubitril-valsartan (ENTRESTO) 97-103 MG Take 1 tablet by mouth 2 (two) times daily. 180 tablet 1   spironolactone (ALDACTONE) 25 MG tablet Take 1 tablet (25 mg total) by mouth daily. Please keep upcoming appointment in February 2023 for future refills. Thank you 90 tablet 1   TRULICITY 3 FI/4.3PI SOPN INJECT 3 MG AS DIRECTED ONCE A WEEK 4 mL 1   No current facility-administered medications on file prior to visit.    Family History  Problem Relation Age of Onset   Hypertension Father    Diabetes Father     Social History   Socioeconomic History   Marital status: Married    Spouse name: Not on file   Number of children: Not on file   Years of education: Not on file   Highest education level: Not on file  Occupational History   Occupation: Chemical engineer  Tobacco Use   Smoking status: Never   Smokeless tobacco: Never  Vaping Use   Vaping Use: Never used  Substance and Sexual Activity   Alcohol use: Yes    Alcohol/week: 0.0 standard drinks of alcohol    Comment: occ   Drug use: No   Sexual activity: Yes    Birth control/protection: Surgical  Other Topics Concern   Not on file  Social History Narrative   Studying at [employer] in early childhood education.   Married.   Social Determinants of Health   Financial Resource Strain: Not on file  Food Insecurity: Not on file  Transportation Needs: Not on file  Physical Activity: Not on file  Stress: Not on file  Social Connections: Not on file  Intimate Partner Violence: Not on file    Review of Systems: ROS negative except for what is noted on the assessment and plan.  There were no vitals filed for this visit.  Physical Exam: Constitutional: well-appearing ***  sitting in ***, in no acute distress HENT: normocephalic atraumatic, mucous membranes moist Eyes: conjunctiva non-erythematous Cardiovascular: regular rate and rhythm, no m/r/g Pulmonary/Chest: normal work of breathing on room air, lungs clear to auscultation bilaterally Abdominal: soft, non-tender, non-distended MSK: normal bulk and tone Neurological: alert & oriented x 3, no focal deficit Skin: warm and dry Psych: normal mood and behavior  Assessment & Plan:     Patient {GC/GE:[id number]::"discussed with","seen with"} Dr. {RJJOA:[id number]::"KZSWFUXN","A. Hoffman","Mullen","Narendra","Vincent","Guilloud","Lau","Machen"}  No problem-specific Assessment & Plan notes found for this encounter.   Buddy Duty, D.O. [facility], PGY-2 Phone: [phone] Date 12/11/2021 Time 8:36 AM

## 2022-01-21 ENCOUNTER — Other Ambulatory Visit: Payer: Self-pay | Admitting: Cardiovascular Disease

## 2022-01-21 DIAGNOSIS — I5022 Chronic systolic (congestive) heart failure: Secondary | ICD-10-CM

## 2022-01-31 ENCOUNTER — Ambulatory Visit: Payer: BC Managed Care – PPO | Admitting: Student

## 2022-01-31 ENCOUNTER — Encounter: Payer: Self-pay | Admitting: Student

## 2022-01-31 VITALS — BP 129/75 | HR 79 | Temp 98.3°F | Ht 60.0 in | Wt 202.2 lb

## 2022-01-31 DIAGNOSIS — E1169 Type 2 diabetes mellitus with other specified complication: Secondary | ICD-10-CM | POA: Diagnosis not present

## 2022-01-31 DIAGNOSIS — B309 Viral conjunctivitis, unspecified: Secondary | ICD-10-CM | POA: Diagnosis not present

## 2022-01-31 DIAGNOSIS — E119 Type 2 diabetes mellitus without complications: Secondary | ICD-10-CM

## 2022-01-31 DIAGNOSIS — I110 Hypertensive heart disease with heart failure: Secondary | ICD-10-CM | POA: Diagnosis not present

## 2022-01-31 DIAGNOSIS — I10 Essential (primary) hypertension: Secondary | ICD-10-CM

## 2022-01-31 DIAGNOSIS — I5022 Chronic systolic (congestive) heart failure: Secondary | ICD-10-CM | POA: Diagnosis not present

## 2022-01-31 DIAGNOSIS — H1031 Unspecified acute conjunctivitis, right eye: Secondary | ICD-10-CM

## 2022-01-31 DIAGNOSIS — E669 Obesity, unspecified: Secondary | ICD-10-CM

## 2022-01-31 DIAGNOSIS — L819 Disorder of pigmentation, unspecified: Secondary | ICD-10-CM

## 2022-01-31 HISTORY — DX: Unspecified acute conjunctivitis, right eye: H10.31

## 2022-01-31 LAB — POCT GLYCOSYLATED HEMOGLOBIN (HGB A1C): Hemoglobin A1C: 7.5 % — AB (ref 4.0–5.6)

## 2022-01-31 LAB — GLUCOSE, CAPILLARY: Glucose-Capillary: 143 mg/dL — ABNORMAL HIGH (ref 70–99)

## 2022-01-31 NOTE — Patient Instructions (Signed)
Thank you, Ms.Valree A Hansen for allowing [name] to provide your care today. Today we discussed.    Diabetes Please continue your synjardy, trulictiy, and insulin. Please come in to be sooner than 3 months if you notice that you are having frequent low blood sugars. Please continue to work on exercise and diet  High blood pressure Please continue your spironolactone, entresto, and metoprolol.   Conjunctivitis "pink eye" Please wash your hands after rubbing your eye. You can use benadryl to help with the itching and warm compresses. If it worsens and you start noticing thick white drainage, please call the clinic and we will prescribe you eye drops  Head plaque For the plaque on your scalp I recommend following up with your dermatologist. If you need a new referral please call our clinic.   I have ordered the following labs for you:   Lab Orders         Glucose, capillary         POC Hbg A1C       Referrals ordered today:   Referral Orders  No referral(s) requested today     I have ordered the following medication/changed the following medications:   Stop the following medications: Medications Discontinued During This Encounter  Medication Reason   fluticasone furoate-vilanterol (BREO ELLIPTA) 200-25 MCG/INH AEPB    cephALEXin (KEFLEX) 500 MG capsule    oxyCODONE (ROXICODONE) 5 MG immediate release tablet      Start the following medications: No orders of the defined types were placed in this encounter.    Follow up: 3 months for diabetes follow up   Should you have any questions or concerns please call the internal medicine clinic at [phone].    Sanjuana Letters, D.O. [place]

## 2022-01-31 NOTE — Assessment & Plan Note (Signed)
Assessment: BP well controlled on entresto 200 mg, metoprolol 200 mg daily, and spironolactone 25 mg daily  Plan: - continue regimen as per above

## 2022-01-31 NOTE — Assessment & Plan Note (Signed)
Assessment: Hx of non-ischemic cardiomyopathy. GDMT of metoprolol, entresto, spironolactone and emapgliflozin. She is euvolemic on exam today and without shortness of breath.   Plan: - continue GDMT as per above

## 2022-02-01 DIAGNOSIS — L819 Disorder of pigmentation, unspecified: Secondary | ICD-10-CM | POA: Insufficient documentation

## 2022-02-01 NOTE — Assessment & Plan Note (Signed)
Assessment: Presents with a 1cm circular raised pigmented nodule of her head. She had a similar lesion last year, located on the left side of her head. Denies the area itching, being painful, or bleeding. A few days after noticing the lesion it fell out and left a scab. This new lesion appeared recently (see media tab). Initially appears consistent with a seborrheic keratosis, however, odd course with it falling off without intervention. Believe this warrants further evaluation by dermatology.   Plan: - dermatology referral

## 2022-02-01 NOTE — Assessment & Plan Note (Signed)
Assessment: A1c of 8.6% to 7.5%. Congratulated her on this and encouraged her to continue improving her diet and exercising. Current medication regimen of basaglar 23 U daily, synjardy 5-'1000mg'$  BID, and trulicity 3 mg once weekly. Follow up in 3 months for repeat A1c  Plan: - contine basaglar 23 U daily, synjardy 08-1386 mg BID, and trulicity 3 mg weekly

## 2022-02-01 NOTE — Progress Notes (Signed)
CC: follow up diabetes, hypertension  HPI:  Sandra [name] is a 57 y.o. female living with a history stated below and presents today for follow up of her diabetes and hypertension. Please see problem based assessment and plan for additional details.  Past Medical History:  Diagnosis Date   Acute pain of left shoulder 06/07/2017   Adenomyosis 06/03/2012   Asthma    as a child   Asthma 11/07/2005   Reported Diagnosis in 2012, no formal spirometry    Cardiomyopathy ([place])    likely nonischemic DCM per Dr. 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   Perimenopausal menorrhagia 02/20/2012   Perimenopausal menorrhagia  02/20/2012   Psoriasis 04/29/2013   Right foot strain 05/15/2021   Right low back pain 03/02/2014   Routine adult health maintenance 09/27/2010   Swelling of abdominal wall 03/02/2014   Swelling of abdominal wall 03/02/2014   Symptoms of upper respiratory infection (URI) 04/01/2018   Tinnitus 06/01/2020   Viral URI with cough 01/13/2013    Current Outpatient Medications on File Prior to Visit  Medication Sig Dispense Refill   albuterol (VENTOLIN HFA) 108 (90 Base) MCG/ACT inhaler Inhale 2 puffs into the lungs every 6 (six) hours as needed for wheezing or shortness of breath. 8 g 6   aspirin EC 81 MG tablet Take 1 tablet (81 mg total) by mouth daily. Swallow whole. 30 tablet 9   atorvastatin (LIPITOR) 80 MG tablet Take 1 tablet (80 mg total) by mouth daily. 90 tablet 3   eletriptan (RELPAX) 20 MG tablet Take 1 tablet (20 mg total) by mouth as needed for migraine or headache. May repeat in 2 hours if headache persists or recurs. 10 tablet 0   Empagliflozin-metFORMIN HCl (SYNJARDY) 05-998 MG TABS Take 1 tablet by mouth 2 (two) times daily. 60 tablet 11   ENTRESTO 97-103 MG Take 1 tablet by mouth twice daily 180 tablet 0   escitalopram (LEXAPRO) 10 MG tablet Take 1 tablet (10 mg total) by mouth daily. 90 tablet 3   fluticasone (FLONASE) 50 MCG/ACT nasal spray Place 1-2 sprays into both nostrils daily as needed for allergies or rhinitis. (Patient not taking: Reported on 03/13/2021)     Insulin Glargine (BASAGLAR KWIKPEN) 100 UNIT/ML INJECT 23 UNITS SUBCUTANEOUSLY AT BEDTIME 15 mL 0   Insulin Pen  Needle (B-D UF III MINI PEN NEEDLES) 31G X 5 MM MISC 1 Stick by Does not apply route daily. 11.69, insulin requirement 30 each 3   metoprolol (TOPROL-XL) 200 MG 24 hr tablet Take 1 tablet by mouth once daily 90 tablet 1   naproxen (NAPROSYN) 375 MG tablet Take 1 tablet (375 mg total) by mouth 2 (two) times daily. 20 tablet 0   pantoprazole (PROTONIX) 40 MG tablet Take 1 tablet by mouth once daily 90 tablet 0    spironolactone (ALDACTONE) 25 MG tablet Take 1 tablet (25 mg total) by mouth daily. Please keep upcoming appointment in February 2023 for future refills. Thank you 90 tablet 1   TRULICITY 3 OF/7.5ZW SOPN INJECT 3 MG AS DIRECTED ONCE A WEEK 4 mL 1   No current facility-administered medications on file prior to visit.    Family History  Problem Relation Age of Onset   Hypertension Father    Diabetes Father     Social History   Socioeconomic History   Marital status: Married    Spouse name: Not on file   Number of children: Not on file   Years of education: Not on file   Highest education level: Not on file  Occupational History   Occupation: Chemical engineer  Tobacco Use   Smoking status: Never   Smokeless tobacco: Never  Vaping Use   Vaping Use: Never used  Substance and Sexual Activity   Alcohol use: Yes    Alcohol/week: 0.0 standard drinks of alcohol    Comment: occ   Drug use: No   Sexual activity: Yes    Birth control/protection: Surgical  Other Topics Concern   Not on file  Social History Narrative   Studying at [employer] in early childhood education.   Married.   Social Determinants of Health   Financial Resource Strain: Low Risk  (01/31/2022)   Overall Financial Resource Strain (CARDIA)    Difficulty of Paying Living Expenses: Not hard at all  Food Insecurity: No Food Insecurity (01/31/2022)   Hunger Vital Sign    Worried About Running Out of Food in the Last Year: Never true    Ran Out of Food in the Last Year: Never true  Transportation Needs: No Transportation Needs (01/31/2022)   PRAPARE - Hydrologist (Medical): No    Lack of Transportation (Non-Medical): No  Physical Activity: Not on file  Stress: No Stress Concern Present (01/31/2022)   [place]    Feeling of Stress : Not at all  Social Connections: Moderately Integrated (01/31/2022)   Social Connection and  Isolation Panel [NHANES]    Frequency of Communication with Friends and Family: More than three times a week    Frequency of Social Gatherings with Friends and Family: More than three times a week    Attends Religious Services: More than 4 times per year    Active Member of [employer] or Organizations: No    Attends Archivist Meetings: Never    Marital Status: Married  Human resources officer Violence: Not At Risk (01/31/2022)   Humiliation, Afraid, Rape, and Kick questionnaire    Fear of Current or Ex-Partner: No    Emotionally Abused: No    Physically Abused: No    Sexually Abused: No    Review of Systems: ROS negative except for what is noted on the assessment and plan.  Vitals:   01/31/22 1442  BP: 129/75  Pulse: 79  Temp: 98.3 F (36.8 C)  TempSrc: Oral  SpO2: 100%  Weight: 202 lb 3.2 oz (91.7 kg)  Height: 5' (1.524 m)    Physical Exam: Constitutional:  in no acute distress HENT: normocephalic atraumatic Eyes: conjunctiva non-erythematous Neck: supple Cardiovascular: regular rate and rhythm, no m/r/g Pulmonary/Chest: normal work of breathing on room air MSK: normal bulk and tone Neurological: alert & oriented x 3 Skin: warm and dry. 1 cm round, raised, pigmented scalp lesion Psych: normal mood  Assessment & Plan:   Essential hypertension Assessment: BP well controlled on entresto 200 mg, metoprolol 200 mg daily, and spironolactone 25 mg daily  Plan: - continue regimen as per above  Chronic HFrEF (heart failure with reduced ejection fraction) (HCC) Assessment: Hx of non-ischemic cardiomyopathy. GDMT of metoprolol, entresto, spironolactone and emapgliflozin. She is euvolemic on exam today and without shortness of breath.   Plan: - continue GDMT as per above   Diabetes mellitus type 2 in obese [facility]) Assessment: A1c of 8.6% to 7.5%. Congratulated her on this and encouraged her to continue improving her diet and exercising. Current medication regimen of  basaglar 23 U daily, synjardy 5-'1000mg'$  BID, and trulicity 3 mg once weekly. Follow up in 3 months for repeat A1c  Plan: - contine basaglar 23 U daily, synjardy 05-1023 mg BID, and trulicity 3 mg weekly  Acute conjunctivitis, right eye Assessment: Presents with right eye itching, matting, and erythema of lower conjunctiva for the past day. Denies any drainage from the area or change in vision. Denies having symptoms in her left eye. Suspect viral etiology, continue to treat conservativly with warm compresses and benadryl for any itching. If symptoms worsen or begins having active drainage, instructed to call clinic and will discuss further management. 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   Plan: - benadryl for itching, warm compresses - recommended avoiding touching other eye and to wash hands frequently - follow up if no improvement in sx or new sx arise  Pigmented skin lesion of uncertain behavior of head Assessment: Presents with a 1cm circular raised pigmented nodule of her head. She had a similar lesion last year, located on the left side of her head. Denies the area itching, being painful, or bleeding. A few days after noticing the lesion it fell out and left a scab. This new lesion appeared recently (see media tab). Initially appears consistent with a seborrheic keratosis, however, odd course with it falling off without intervention. Believe this warrants further evaluation by dermatology.   Plan: - dermatology referral  Patient discussed with Dr. Laurena Slimmer, D.O. [facility], PGY-3 Phone: [phone] Date 02/01/2022 Time 8:55 AM

## 2022-02-01 NOTE — Assessment & Plan Note (Addendum)
Assessment: Presents with right eye itching, matting, and erythema of lower conjunctiva for the past day. Denies any drainage from the area or change in vision. Denies having symptoms in her left eye. Suspect viral etiology, continue to treat conservativly with warm compresses and benadryl for any itching. If symptoms worsen or begins having active drainage, instructed to call clinic and will discuss further management. 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   Plan: - benadryl for itching, warm compresses - recommended avoiding touching other eye and to wash hands frequently - follow up if no improvement in sx or new sx arise

## 2022-02-02 ENCOUNTER — Telehealth: Payer: Self-pay

## 2022-02-02 ENCOUNTER — Other Ambulatory Visit: Payer: Self-pay | Admitting: Student

## 2022-02-02 MED ORDER — KETOTIFEN FUMARATE 0.025 % OP SOLN: 1.0000 [drp] | Freq: Two times a day (BID) | OPHTHALMIC | 0 refills | Status: DC

## 2022-02-02 NOTE — Telephone Encounter (Signed)
Patient was seen on Wednesday for a red eye.  Has tried compresses with no relief.  Still itching and burning.  Was asked to call back to get medication called in for.

## 2022-02-02 NOTE — Telephone Encounter (Signed)
Requesting eye drop, pt states her right eyes is still swollen. Please call pt back.

## 2022-02-02 NOTE — Telephone Encounter (Signed)
Called and spoke with Sandra [name]. She is continuing to have itching and swelling but denies any drainage. Her left eye is now starting to itch. Will continue warm compresses and send in ketotifen eye drops.

## 2022-02-02 NOTE — Progress Notes (Signed)
Internal Medicine Clinic Attending  Case discussed with Dr. Katsadouros  At the time of the visit.  We reviewed the resident's history and exam and pertinent patient test results.  I agree with the assessment, diagnosis, and plan of care documented in the resident's note.  

## 2022-03-01 ENCOUNTER — Ambulatory Visit (INDEPENDENT_AMBULATORY_CARE_PROVIDER_SITE_OTHER): Payer: BC Managed Care – PPO | Admitting: Student

## 2022-03-01 DIAGNOSIS — B349 Viral infection, unspecified: Secondary | ICD-10-CM

## 2022-03-01 NOTE — Progress Notes (Signed)
[facility] Internal Medicine Residency Telephone Encounter Continuity Care Appointment  HPI:  This telephone encounter was created for [name] on 03/01/2022 for the following purpose/cc: congestion, sore throat, cough. Verified identity via DOB and home address. Sandra [name] reports cold symptoms for the past 5 days. 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  5 days ago, she began having a sore throat, runny nose, congestion, and a cough productive of brownish green sputum. She has been feeling unwell during this time and had to miss work today because of her symptoms. She is wondering if there are any antibiotics that she needs. She denies any fevers (highest recorded temp of 98.2 F), chills, N/V, headaches, urinary changes, bowel changes. She does note some right-sided chest pain associated with her cough. The chest pain does not radiate and is not associated with exertion. She has been using coricidin-HBP to help with symptoms given HTN. We discussed that her symptoms are likely viral in etiology given recent sick contacts. We discussed that she would not require antibiotics for viral infections. Given she is 5 days out from symptoms onset, likely no utility with antiviral agents as well. I did advise her to take a home COVID test to determine if further isolation is required. She will do this today. We also discussed OTC symptom management for her sore throat and cough. She is adamant about being seen in person as she is feeling unwell, so will reach out to our staff to schedule an in-person visit with her.   Past Medical History:  Past Medical History:  Diagnosis Date   Acute pain of left shoulder 06/07/2017   Adenomyosis 06/03/2012   Asthma    as a child   Asthma 11/07/2005   Reported Diagnosis in 2012, no formal spirometry    Cardiomyopathy ([place])    likely  nonischemic DCM per Dr. 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   Assessment / Plan / Recommendations:  Please see A&P under problem oriented charting for assessment of the patient's  acute and chronic medical conditions.  As always, pt is advised that if symptoms worsen or new symptoms arise, they should go to an urgent care facility or to to ER for further evaluation.   Consent and Medical Decision Making:  Patient discussed with Dr.  Saverio Danker This is a telephone encounter between Sandra [name] and Sandra [name] on 03/01/2022 for cough, congestion, sore throat. The visit was conducted with the patient located at home and Sandra [name] at [facility]. The patient's identity was confirmed using their DOB and current address. The patient has consented to being evaluated through a telephone encounter and understands the associated risks (an examination cannot be done and the patient may need to come in for an appointment) / benefits (allows the patient to remain at home, decreasing exposure to coronavirus). I personally spent 16 minutes on medical discussion.

## 2022-03-01 NOTE — Assessment & Plan Note (Signed)
Ms. Dicola reports cold symptoms for the past 5 days. 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   5 days ago, she began having a sore throat, runny nose, congestion, and a cough productive of brownish green sputum. She has been feeling unwell during this time and had to miss work today because of her symptoms. She is wondering if there are any antibiotics that she needs. She denies any fevers (highest recorded temp of 98.2 F), chills, N/V, headaches, urinary changes, bowel changes. She does note some right-sided chest pain associated with her cough. The chest pain does not radiate and is not associated with exertion. She has been using coricidin-HBP to help with symptoms given HTN.  We discussed that her symptoms are likely viral in etiology given recent sick contacts. We discussed that she would not require antibiotics for viral infections. Given she is 5 days out from symptoms onset, likely no utility with antiviral agents as well. I did advise her to take a home COVID test to determine if further isolation is required. She will do this today. We also discussed OTC symptom management for her sore throat and cough. She is adamant about being seen in person as she is feeling unwell, so will reach out to our staff to schedule an in-person visit with her.

## 2022-03-04 ENCOUNTER — Other Ambulatory Visit: Payer: Self-pay | Admitting: Internal Medicine

## 2022-03-04 DIAGNOSIS — I5022 Chronic systolic (congestive) heart failure: Secondary | ICD-10-CM

## 2022-03-05 NOTE — Addendum Note (Signed)
Addended by: Charise Killian on: 03/05/2022 08:36 AM   Modules accepted: Level of Service

## 2022-03-05 NOTE — Progress Notes (Signed)
Internal Medicine Clinic Attending  Case discussed with Dr. Jinwala  At the time of the visit.  We reviewed the resident's history and exam and pertinent patient test results.  I agree with the assessment, diagnosis, and plan of care documented in the resident's note.  

## 2022-03-12 ENCOUNTER — Other Ambulatory Visit: Payer: Self-pay | Admitting: Internal Medicine

## 2022-03-12 DIAGNOSIS — K219 Gastro-esophageal reflux disease without esophagitis: Secondary | ICD-10-CM

## 2022-03-21 ENCOUNTER — Other Ambulatory Visit: Payer: Self-pay | Admitting: Internal Medicine

## 2022-03-21 DIAGNOSIS — E1169 Type 2 diabetes mellitus with other specified complication: Secondary | ICD-10-CM

## 2022-03-28 ENCOUNTER — Encounter: Payer: Self-pay | Admitting: Student

## 2022-03-28 ENCOUNTER — Ambulatory Visit (INDEPENDENT_AMBULATORY_CARE_PROVIDER_SITE_OTHER): Payer: BC Managed Care – PPO | Admitting: Student

## 2022-03-28 VITALS — BP 137/76 | HR 98 | Temp 98.4°F | Ht 60.0 in | Wt 194.4 lb

## 2022-03-28 DIAGNOSIS — B349 Viral infection, unspecified: Secondary | ICD-10-CM

## 2022-03-28 MED ORDER — GUAIFENESIN-CODEINE 100-10 MG/5ML PO SYRP: 5.0000 mL | ORAL_SOLUTION | Freq: Every evening | ORAL | 0 refills | Status: DC | PRN

## 2022-03-28 NOTE — Assessment & Plan Note (Addendum)
URI symptoms of coughing, congestion, rhinorrhea body aches, fever started about 1 month ago.  She had a telehealth visit with [facility] on 2/8 who diagnosed her with viral upper respiratory infection.  She was advised to use over-the-counter medication to help with the symptoms.  She states that she got over this episode and was doing well for 2 weeks.  Her symptoms returned 1 week ago with similar presentation with coughing, congestion, increased mucus production and sinus pressure.  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   She had a visit with a teledoc who prescribed 10 days of amoxicillin.  She is on day 7 of the course and states that she is feeling better.  She only needs her albuterol inhaler 2 times for the last 2 weeks.  Her biggest concern now is congestion and coughing.  She is taking Mucinex DM and has tried [employer] without any relief.  Her lungs are clear to auscultation without any wheezing.  No purulent discharge noted of her oral nasal cavity.  She reports mild pressure in her maxillary sinuses bilaterally.  Patient could have a secondary bacterial sinusitis after her first viral infection.  Low suspicion for an asthma exacerbation.  Advised patient to finish her course of amoxicillin.  Prescribed low-dose guaifenesin-codeine for cough suppressant.  Advised patient to take this at night due to sedation effect.  She can also take antihistamine for possible allergy as well.  For her congestion, patient can use Flonase and Neti pot.

## 2022-03-28 NOTE — Progress Notes (Signed)
CC: Persistent cough and congestion  HPI:  [name] is a 57 y.o. living with hypertension, diabetes, asthma, who presents to the clinic for evaluation of her persistent coughing and congestion after her viral infection.  Please see problem based charting for detail  Past Medical History:  Diagnosis Date   Acute pain of left shoulder 06/07/2017   Adenomyosis 06/03/2012   Asthma    as a child   Asthma 11/07/2005   Reported Diagnosis in 2012, no formal spirometry    Cardiomyopathy ([place])    likely nonischemic DCM per Dr. 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   Perimenopausal menorrhagia 02/20/2012    Perimenopausal menorrhagia 02/20/2012   Psoriasis 04/29/2013   Right foot strain 05/15/2021   Right low back pain 03/02/2014   Routine adult health maintenance 09/27/2010   Swelling of abdominal wall 03/02/2014   Swelling of abdominal wall 03/02/2014   Symptoms of upper respiratory infection (URI) 04/01/2018   Tinnitus 06/01/2020   Viral URI with cough 01/13/2013   Review of Systems:  per HPI  Physical Exam:  Vitals:   03/28/22 1507  BP: 137/76  Pulse: 98  Temp: 98.4 F (36.9 C)  TempSrc: Oral  SpO2: 97%  Weight: 194 lb 6.4 oz (88.2 kg)  Height: 5' (1.524 m)   Physical Exam Constitutional:      General: She is not in acute distress.    Appearance: She is not ill-appearing.  HENT:     Head: Normocephalic.     Nose:     Comments: Erythematous nasal mucosa.  No polyps or obstruction.  No rhinorrhea.    Mouth/Throat:     Mouth: Mucous membranes are moist.     Pharynx: No oropharyngeal exudate or posterior oropharyngeal erythema.  Cardiovascular:     Rate and Rhythm: Normal rate and regular rhythm.  Pulmonary:     Effort: Pulmonary effort is normal. No respiratory distress.     Breath sounds: Normal breath sounds. No wheezing.  Musculoskeletal:        General: Normal range of motion.  Skin:    General: Skin is warm.  Neurological:     Mental Status: She is alert. Mental status is at baseline.  Psychiatric:        Mood and Affect: Mood normal.  Assessment & Plan:   See Encounters Tab for problem based charting.  Viral illness URI symptoms of coughing, congestion, rhinorrhea body aches, fever started about 1 month ago.  She had a telehealth visit with [facility] on 2/8 who diagnosed her with viral upper respiratory infection.  She was advised to use over-the-counter medication to help with the symptoms.  She states that she got over this episode and was doing well for 2 weeks.  Her symptoms returned 1 week ago with similar presentation with coughing, congestion, increased mucus  production and sinus pressure.  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   She had a visit with a teledoc who prescribed 10 days of amoxicillin.  She is on day 7 of the course and states that she is feeling better.  She only needs her albuterol inhaler 2 times for the last 2 weeks.  Her biggest concern now is congestion and coughing.  She is taking Mucinex DM and has tried [employer] without any relief.  Her lungs are clear to auscultation without any wheezing.  No purulent discharge noted of her oral nasal cavity.  She reports mild pressure in her maxillary sinuses bilaterally.  Patient could have a secondary bacterial sinusitis after her first viral infection.  Low suspicion for an asthma exacerbation.  Advised patient to finish her course of amoxicillin.  Prescribed low-dose guaifenesin-codeine for cough suppressant.  Advised patient to take this at night due to sedation effect.  She can also take antihistamine for possible allergy as well.  For her congestion, patient can use Flonase and Neti pot.    Patient discussed with Dr. Jimmye Norman

## 2022-03-28 NOTE — Patient Instructions (Signed)
Ms. Gutter,  It was nice seeing you in the clinic today.  I am sorry not feeling well.  Please finish your course of amoxicillin.  We can use Flonase and a Neti pot to help with the congestion.  I prescribed a small dose of codeine based cough medication.  This is a opioid based so it can be sedating.  Please take 5 ml before going to sleep as needed.  Please return in 2 months for repeat A1c  Dr. Alfonse Spruce

## 2022-03-29 ENCOUNTER — Telehealth: Payer: Self-pay

## 2022-03-29 ENCOUNTER — Other Ambulatory Visit: Payer: Self-pay

## 2022-03-29 DIAGNOSIS — B349 Viral infection, unspecified: Secondary | ICD-10-CM

## 2022-03-29 MED ORDER — HYDROCOD POLI-CHLORPHE POLI ER 10-8 MG/5ML PO SUER: 5.0000 mL | Freq: Every evening | ORAL | 0 refills | Status: AC | PRN

## 2022-03-29 MED ORDER — PROMETHAZINE-CODEINE 6.25-10 MG/5ML PO SOLN: 5.0000 mL | Freq: Every evening | ORAL | 0 refills | Status: DC | PRN

## 2022-03-29 NOTE — Telephone Encounter (Signed)
Called pt to see if she's willing to call a different pharmacy to check availability. Stated she will try but she prefers the doctor to send a different med to [place].

## 2022-03-29 NOTE — Addendum Note (Signed)
Addended byGaylan Gerold on: 03/29/2022 11:23 AM   Modules accepted: Orders

## 2022-03-29 NOTE — Telephone Encounter (Signed)
Pt states guaiFENesin-codeine (ROBITUSSIN AC) 100-10 MG/5ML syrup is on back order at [facility] on [address]. Requesting another Rx or send it to another pharmacy. Please call pt back.

## 2022-03-29 NOTE — Addendum Note (Signed)
Addended byGaylan Gerold on: 03/29/2022 11:08 AM   Modules accepted: Orders

## 2022-04-02 NOTE — Progress Notes (Signed)
Internal Medicine Clinic Attending  Case discussed with Dr. Nguyen  At the time of the visit.  We reviewed the resident's history and exam and pertinent patient test results.  I agree with the assessment, diagnosis, and plan of care documented in the resident's note. 

## 2022-04-15 ENCOUNTER — Other Ambulatory Visit: Payer: Self-pay | Admitting: Internal Medicine

## 2022-04-15 DIAGNOSIS — I5022 Chronic systolic (congestive) heart failure: Secondary | ICD-10-CM

## 2022-04-15 NOTE — Progress Notes (Unsigned)
Evaluation Performed:  Follow-up visit  Date:  04/19/2022   ID:  [name], [name] 28-Mar-1965, MRN [id number]  Provider Location: Office  PCP:  Dorethea Clan, DO  Cardiologist:  Johnsie Cancel Electrophysiologist:  None   Chief Complaint:   Dyspnea  History of Present Illness:    Sandra [name] is a 57 y.o. female with DM, HLD, HTN first seen 04/26/17 for dyspnea. Also had atypical chest pain with LBBB on ECG ER visit r/o normal CXR  History of NIDCM 2014 EF 45% Echo 05/09/17 same 40-45% no significant valve disease or pulmonary hypertension Myovue 05/20/17 normal  Estimated EF 38%  Done well on EntrestoDiuretic/beta blocker / statin and ASA She sees behavioral health for anxiety / depression Started on low dose Lexapro 11/11/19 She is in much better place says church helped her    21  yo mom living with her  Husband works night shift so with her during [place] turns 7 working at [employer] Discussed compliance with meds as she misses doses on occasion  TTE 10/22/19 with EF 40% stable  TTE 03/22/21 with EF 50-55% improved trivial MR AV sclerosis   COVID positive August 2021 CT 10/08/19 reviewed IMPRESSION: 1. No evidence of acute pulmonary embolism. 2. Improvement in bibasilar predominant airspace consolidative opacities. 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  Huge [employer] fan. Compliant with meds Functional class one    Past Medical History:  Diagnosis Date   Acute pain of left shoulder 06/07/2017   Adenomyosis 06/03/2012   Asthma    as a child   Asthma 11/07/2005   Reported Diagnosis in 2012, no formal spirometry    Cardiomyopathy ([place])    likely nonischemic DCM per Dr. 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   Perimenopausal menorrhagia 02/20/2012   Perimenopausal menorrhagia 02/20/2012   Psoriasis 04/29/2013   Right foot strain 05/15/2021   Right low back pain 03/02/2014   Routine adult health maintenance 09/27/2010   Swelling of abdominal wall 03/02/2014   Swelling of abdominal wall 03/02/2014   Symptoms of upper respiratory infection (URI) 04/01/2018   Tinnitus 06/01/2020   Viral URI with cough 01/13/2013   Past Surgical History:  Procedure Laterality Date   CHOLECYSTECTOMY  2009   DILATATION & CURETTAGE/HYSTEROSCOPY WITH MYOSURE N/A 12/25/2017   Procedure: DILATATION & CURETTAGE/HYSTEROSCOPY   POLYPECTOMY WITH MYOSURE;  Surgeon: Charyl Bigger, MD;  Location: [facility];  Service: Gynecology;  Laterality: N/A;   TUBAL LIGATION       Current  Meds  Medication Sig   albuterol (VENTOLIN HFA) 108 (90 Base) MCG/ACT inhaler Inhale 2 puffs into the lungs every 6 (six) hours as needed for wheezing or shortness of breath.   aspirin EC 81 MG tablet Take 1 tablet (81 mg total) by mouth daily. Swallow whole.   atorvastatin (LIPITOR) 80 MG tablet Take 1 tablet (80 mg total) by mouth daily.   Dulaglutide (TRULICITY) 3 0000000 SOPN INJECT 3 MG AS DIRECTED  ONCE A WEEK   eletriptan (RELPAX) 20 MG tablet Take 1 tablet (20 mg total) by mouth as needed for migraine or headache. May repeat in 2 hours if headache persists or recurs.   Empagliflozin-metFORMIN HCl (SYNJARDY) 05-998 MG TABS Take 1 tablet by mouth 2 (two) times daily.   ENTRESTO 97-103 MG Take 1 tablet by mouth twice daily   escitalopram (LEXAPRO) 10 MG tablet Take 1 tablet (10 mg total) by mouth daily.   fluticasone (FLONASE) 50 MCG/ACT nasal spray Place 1-2 sprays into both nostrils daily as needed for allergies or rhinitis.   Insulin Glargine (BASAGLAR KWIKPEN) 100 UNIT/ML INJECT 23 UNITS SUBCUTANEOUSLY AT BEDTIME   Insulin Pen Needle (B-D UF III MINI PEN NEEDLES) 31G X 5 MM MISC 1 Stick by Does not apply route daily. 11.69, insulin requirement   ketotifen (ZADITOR) 0.025 % ophthalmic solution Place 1 drop into both eyes 2 (two) times daily.   metoprolol (TOPROL-XL) 200 MG 24 hr tablet Take 1 tablet by mouth once daily   naproxen (NAPROSYN) 375 MG tablet Take 1 tablet (375 mg total) by mouth 2 (two) times daily.   pantoprazole (PROTONIX) 40 MG tablet Take 1 tablet by mouth once daily   spironolactone (ALDACTONE) 25 MG tablet Take 1 tablet by mouth once daily     Allergies:   Drug class [trazodone and nefazodone] and Sertraline hcl   Social History   Tobacco Use   Smoking status: Never   Smokeless tobacco: Never  Vaping Use   Vaping Use: Never used  Substance Use Topics   Alcohol use: Yes    Alcohol/week: 0.0 standard drinks of alcohol    Comment: occ   Drug use: No      Family Hx: The patient's family history includes Diabetes in her father; Hypertension in her father.  ROS:   Please see the history of present illness.     All other systems reviewed and are negative.   Prior CV studies:   The following studies were reviewed today:  CXR 12/12/17 TTE 05/09/17 Myovue 05/20/17 TTE 10/22/19   Labs/Other Tests and Data Reviewed:    EKG:   SR IVCD rate 96 03/19/17  04/19/2022 NSR rate 76 LBBB  Recent Labs: 10/17/2021: ALT 27; BUN 8; Creatinine, Ser 0.56; Hemoglobin 11.0; Platelets 277; Potassium 4.0; Sodium 139   Recent Lipid Panel Lab Results  Component Value Date/Time   CHOL 129 02/23/2021 02:45 PM   TRIG 91 02/23/2021 02:45 PM   HDL 30 (L) 02/23/2021 02:45 PM   CHOLHDL 4.3 02/23/2021 02:45 PM   CHOLHDL 3.9 07/11/2018 04:30 AM   LDLCALC 81 02/23/2021 02:45 PM    Wt Readings from Last 3 Encounters:  04/19/22 198  lb 6.4 oz (90 kg)  03/28/22 194 lb 6.4 oz (88.2 kg)  01/31/22 202 lb 3.2 oz (91.7 kg)     Objective:    Vital Signs:  BP (!) 140/70   Pulse 90   Ht 5' (1.524 m)   Wt 198 lb 6.4 oz (90 kg)   SpO2 98%   BMI 38.75 kg/m    Affect appropriate Healthy:  appears stated age HEENT: normal Neck supple with no adenopathy JVP normal no bruits no thyromegaly Lungs clear with no wheezing and good diaphragmatic motion Heart:  S1/S2 no murmur, no rub, gallop or click PMI normal Abdomen: benighn, BS positve, no tenderness, no AAA no bruit.  No HSM or HJR Distal pulses intact with no bruits No edema Neuro non-focal Skin warm and dry No muscular weakness   ASSESSMENT & PLAN:    DCM:  Stable on medical Rx functional class one TTE 03/22/21 improved EF 50-55%  HLD continue statin labs with primary DM:  Discussed low carb diet.  Target hemoglobin A1c is 6.5 or less.  Continue current medications. Anxiety/Depression:  On Lexapro f/u with psychiatry  LBBB:  chronic Yearly ECG accounts for dyssynchrony on TTE   Time:   Today, I have  spent 30 minutes with the patient    Medication Adjustments/Labs and Tests Ordered: Current medicines are reviewed at length with the patient today.  Concerns regarding medicines are outlined above.   Tests Ordered:  None TTE in a year   Medication Changes: No orders of the defined types were placed in this encounter.   Disposition:  Follow up  in a year     Signed, Jenkins Rouge, MD  04/19/2022 2:47 PM    [place]

## 2022-04-19 ENCOUNTER — Ambulatory Visit: Payer: BC Managed Care – PPO | Attending: Cardiovascular Disease | Admitting: Cardiovascular Disease

## 2022-04-19 ENCOUNTER — Encounter: Payer: Self-pay | Admitting: Cardiovascular Disease

## 2022-04-19 VITALS — BP 140/70 | HR 90 | Ht 60.0 in | Wt 198.4 lb

## 2022-04-19 DIAGNOSIS — I447 Left bundle-branch block, unspecified: Secondary | ICD-10-CM | POA: Diagnosis not present

## 2022-04-19 DIAGNOSIS — I420 Dilated cardiomyopathy: Secondary | ICD-10-CM

## 2022-04-19 DIAGNOSIS — I10 Essential (primary) hypertension: Secondary | ICD-10-CM | POA: Diagnosis not present

## 2022-04-19 NOTE — Patient Instructions (Signed)
Medication Instructions:  Your physician recommends that you continue on your current medications as directed. Please refer to the Current Medication list given to you today.  *If you need a refill on your cardiac medications before your next appointment, please call your pharmacy*  Lab Work: If you have labs (blood work) drawn today and your tests are completely normal, you will receive your results only by: MyChart Message (if you have MyChart) OR A paper copy in the mail If you have any lab test that is abnormal or we need to change your treatment, we will call you to review the results.  Testing/Procedures: None ordered today.  Follow-Up: At [facility], you and your health needs are our priority.  As part of our continuing mission to provide you with exceptional heart care, we have created designated Provider Care Teams.  These Care Teams include your primary Cardiologist (physician) and Advanced Practice Providers (APPs -  Physician Assistants and Nurse Practitioners) who all work together to provide you with the care you need, when you need it.  We recommend signing up for the patient portal called "MyChart".  Sign up information is provided on this After Visit Summary.  MyChart is used to connect with patients for Virtual Visits (Telemedicine).  Patients are able to view lab/test results, encounter notes, upcoming appointments, etc.  Non-urgent messages can be sent to your provider as well.   To learn more about what you can do with MyChart, go to [email or web address].    Your next appointment:   1 year(s)  Provider:   Peter Nishan, MD      

## 2022-05-17 ENCOUNTER — Other Ambulatory Visit: Payer: Self-pay | Admitting: Internal Medicine

## 2022-05-17 DIAGNOSIS — F411 Generalized anxiety disorder: Secondary | ICD-10-CM

## 2022-05-28 ENCOUNTER — Other Ambulatory Visit: Payer: Self-pay | Admitting: Internal Medicine

## 2022-05-28 DIAGNOSIS — E785 Hyperlipidemia, unspecified: Secondary | ICD-10-CM

## 2022-06-07 ENCOUNTER — Other Ambulatory Visit: Payer: Self-pay | Admitting: Internal Medicine

## 2022-06-07 DIAGNOSIS — K219 Gastro-esophageal reflux disease without esophagitis: Secondary | ICD-10-CM

## 2022-07-09 ENCOUNTER — Other Ambulatory Visit: Payer: Self-pay | Admitting: Cardiovascular Disease

## 2022-07-09 DIAGNOSIS — I5022 Chronic systolic (congestive) heart failure: Secondary | ICD-10-CM

## 2022-08-02 ENCOUNTER — Encounter: Payer: BC Managed Care – PPO | Admitting: Student

## 2022-08-03 ENCOUNTER — Telehealth: Payer: Self-pay

## 2022-08-03 NOTE — Telephone Encounter (Signed)
Patient has appt on 7/17 at 1445. This can be addressed at that time.

## 2022-08-03 NOTE — Telephone Encounter (Signed)
Patient notified that alternate med can be discussed at upcoming appt on 7/17.

## 2022-08-03 NOTE — Telephone Encounter (Signed)
Pt states she has not received her trulicity for 2 months. States [facility] pharmacy is on back order, requesting another medication. Please call pt back.

## 2022-08-08 ENCOUNTER — Encounter: Payer: BC Managed Care – PPO | Admitting: Student

## 2022-08-08 ENCOUNTER — Telehealth: Payer: Self-pay | Admitting: *Deleted

## 2022-08-08 ENCOUNTER — Ambulatory Visit (INDEPENDENT_AMBULATORY_CARE_PROVIDER_SITE_OTHER): Payer: BC Managed Care – PPO | Admitting: Student

## 2022-08-08 ENCOUNTER — Encounter: Payer: Self-pay | Admitting: Student

## 2022-08-08 ENCOUNTER — Ambulatory Visit (HOSPITAL_COMMUNITY)
Admission: RE | Admit: 2022-08-08 | Discharge: 2022-08-08 | Disposition: A | Discharge status: Home / Self Care | Payer: BC Managed Care – PPO | Source: Ambulatory Visit | Attending: Internal Medicine | Admitting: Internal Medicine

## 2022-08-08 VITALS — BP 145/73 | HR 77 | Temp 98.2°F | Ht 60.0 in | Wt 200.2 lb

## 2022-08-08 DIAGNOSIS — G8929 Other chronic pain: Secondary | ICD-10-CM | POA: Diagnosis present

## 2022-08-08 DIAGNOSIS — E1169 Type 2 diabetes mellitus with other specified complication: Secondary | ICD-10-CM | POA: Diagnosis not present

## 2022-08-08 DIAGNOSIS — Z794 Long term (current) use of insulin: Secondary | ICD-10-CM

## 2022-08-08 DIAGNOSIS — I10 Essential (primary) hypertension: Secondary | ICD-10-CM

## 2022-08-08 DIAGNOSIS — I5022 Chronic systolic (congestive) heart failure: Secondary | ICD-10-CM

## 2022-08-08 DIAGNOSIS — E669 Obesity, unspecified: Secondary | ICD-10-CM

## 2022-08-08 DIAGNOSIS — M25561 Pain in right knee: Secondary | ICD-10-CM | POA: Diagnosis present

## 2022-08-08 DIAGNOSIS — I110 Hypertensive heart disease with heart failure: Secondary | ICD-10-CM

## 2022-08-08 DIAGNOSIS — M65322 Trigger finger, left index finger: Secondary | ICD-10-CM | POA: Insufficient documentation

## 2022-08-08 DIAGNOSIS — D539 Nutritional anemia, unspecified: Secondary | ICD-10-CM

## 2022-08-08 MED ORDER — METFORMIN HCL 1000 MG PO TABS: 1000.0000 mg | ORAL_TABLET | Freq: Two times a day (BID) | ORAL | 5 refills | Status: DC

## 2022-08-08 MED ORDER — EMPAGLIFLOZIN 10 MG PO TABS: 10.0000 mg | ORAL_TABLET | Freq: Every day | ORAL | 3 refills | Status: DC

## 2022-08-08 MED ORDER — TRULICITY 3 MG/0.5ML ~~LOC~~ SOAJ: 3.0000 mg | SUBCUTANEOUS | 1 refills | Status: DC

## 2022-08-08 NOTE — Assessment & Plan Note (Addendum)
Last seen by cardiology 03/28/ 2024.  On GDMT for HFrEF, metoprolol, Entresto, spironolactone, Jardiance.  Continues to be euvolemic on exam today. denies shortness of breath.  No swelling of the lower extremity.  Plan: Continue GDMT.

## 2022-08-08 NOTE — Assessment & Plan Note (Addendum)
Patient states that her [facility] Ruths co-pay has gone up to $185, and it is difficult for her to afford this though she is currently paying and taking the medication consistently.  She has had some issues getting her Trulicity filled due to it being on backorder at [place].  Last A1c 01/31/2022 revealed A1c 7.5.  Plan: - Follow-up in 4 weeks for dedicated diabetes follow-up - A1c - We attempted to check with [facility] to confirm if today had Trulicity.  Staff at [facility] pharmacy seem to believe that they did.  I sent prescription to the [facility] but they later messaged me saying they are on backorder there as well.  I will continue to work with diabetes educators, call pharmacies in order to get this medication filled. - Discontinue Synjardy, split up prescription.  Prescribed Jardiance 10 mg daily, 1000 mg metformin twice daily.

## 2022-08-08 NOTE — Progress Notes (Signed)
Subjective:  CC: Acute on chronic right knee pain, history of congestive heart failure type 2 diabetes  HPI:  Sandra [name] is a 57 y.o. female with a past medical history stated below and presents today for acute on chronic right knee pain as well as routine follow-up for some of her chronic conditions such as type 2 diabetes, hypertension, and congestive heart failure.. Please see problem based assessment and plan for additional details.  Past Medical History:  Diagnosis Date   Acute pain of left shoulder 06/07/2017   Adenomyosis 06/03/2012   Asthma    as a child   Asthma 11/07/2005   Reported Diagnosis in 2012, no formal spirometry    Cardiomyopathy (HCC)    likely nonischemic DCM per Dr. 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   Perimenopausal menorrhagia 02/20/2012   Perimenopausal menorrhagia 02/20/2012   Psoriasis 04/29/2013   Right foot strain 05/15/2021   Right low back pain 03/02/2014   Routine adult health maintenance 09/27/2010   Swelling of abdominal wall 03/02/2014   Swelling of abdominal wall 03/02/2014   Symptoms of upper respiratory infection (URI) 04/01/2018   Tinnitus 06/01/2020   Viral URI with cough 01/13/2013    Current Outpatient Medications on File Prior to Visit  Medication Sig Dispense Refill   albuterol (VENTOLIN HFA) 108 (90 Base) MCG/ACT inhaler Inhale 2 puffs into the lungs every 6 (six) hours as needed for wheezing or shortness of breath. 8 g 6   aspirin EC 81 MG tablet Take 1 tablet (81 mg total) by mouth daily. Swallow whole. 30 tablet 9   atorvastatin (LIPITOR) 80 MG tablet Take 1 tablet by mouth once daily 90 tablet 3   escitalopram (LEXAPRO) 10 MG tablet Take 1 tablet by mouth once daily 90 tablet 1   fluticasone (FLONASE) 50 MCG/ACT nasal spray Place 1-2 sprays into both nostrils daily as needed for allergies or rhinitis.     Insulin Glargine (BASAGLAR KWIKPEN) 100 UNIT/ML INJECT 23 UNITS SUBCUTANEOUSLY AT BEDTIME 15 mL 1   Insulin Pen Needle (B-D UF III MINI PEN NEEDLES) 31G X 5 MM MISC 1 Stick by Does not apply route daily. 11.69, insulin requirement 30 each 3   metoprolol (TOPROL-XL) 200 MG 24 hr tablet Take 1 tablet by mouth once daily 90 tablet 1  naproxen (NAPROSYN) 375 MG tablet Take 1 tablet (375 mg total) by mouth 2 (two) times daily. 20 tablet 0   pantoprazole (PROTONIX) 40 MG tablet Take 1 tablet by mouth once daily 90 tablet 0   sacubitril-valsartan (ENTRESTO) 97-103 MG Take 1 tablet by mouth 2 (two) times daily. 180 tablet 3   spironolactone (ALDACTONE) 25 MG tablet Take 1 tablet by mouth once daily 90 tablet 3   eletriptan (RELPAX) 20 MG tablet Take 1 tablet (20 mg  total) by mouth as needed for migraine or headache. May repeat in 2 hours if headache persists or recurs. (Patient not taking: Reported on 08/08/2022) 10 tablet 0   ketotifen (ZADITOR) 0.025 % ophthalmic solution Place 1 drop into both eyes 2 (two) times daily. (Patient not taking: Reported on 08/08/2022) 5 mL 0   No current facility-administered medications on file prior to visit.    Family History  Problem Relation Age of Onset   Hypertension Father    Diabetes Father     Social History   Socioeconomic History   Marital status: Married    Spouse name: Not on file   Number of children: Not on file   Years of education: Not on file   Highest education level: Not on file  Occupational History   Occupation: Building surveyor  Tobacco Use   Smoking status: Never   Smokeless tobacco: Never  Vaping Use   Vaping status: Never Used  Substance and Sexual Activity   Alcohol use: Yes    Alcohol/week: 0.0 standard drinks of alcohol    Comment: occ   Drug use: No   Sexual activity: Yes    Birth control/protection: Surgical  Other Topics Concern   Not on file  Social History Narrative   Studying at [employer] in early childhood education.   Married.   Social Determinants of Health   Financial Resource Strain: Low Risk  (01/31/2022)   Overall Financial Resource Strain (CARDIA)    Difficulty of Paying Living Expenses: Not hard at all  Food Insecurity: No Food Insecurity (01/31/2022)   Hunger Vital Sign    Worried About Running Out of Food in the Last Year: Never true    Ran Out of Food in the Last Year: Never true  Transportation Needs: No Transportation Needs (01/31/2022)   PRAPARE - Administrator, Civil Service (Medical): No    Lack of Transportation (Non-Medical): No  Physical Activity: Not on file  Stress: No Stress Concern Present (01/31/2022)   [employer] of Occupational Health - Occupational Stress Questionnaire    Feeling of Stress : Not at all  Social  Connections: Moderately Integrated (01/31/2022)   Social Connection and Isolation Panel [NHANES]    Frequency of Communication with Friends and Family: More than three times a week    Frequency of Social Gatherings with Friends and Family: More than three times a week    Attends Religious Services: More than 4 times per year    Active Member of [employer] or Organizations: No    Attends Banker Meetings: Never    Marital Status: Married  Catering manager Violence: Not At Risk (01/31/2022)   Humiliation, Afraid, Rape, and Kick questionnaire    Fear of Current or Ex-Partner: No    Emotionally Abused: No    Physically Abused: No    Sexually Abused: No    Review of Systems: ROS negative except for what is noted on the assessment and plan.  Objective:   Vitals:  08/08/22 1351 08/08/22 1432  BP: (!) 151/68 (!) 145/73  Pulse: 80 77  Temp: 98.2 F (36.8 C)   TempSrc: Oral   SpO2: 100%   Weight: 200 lb 3.2 oz (90.8 kg)   Height: 5' (1.524 m)     Physical Exam: Constitutional: well-appearing in no acute distress HENT: normocephalic atraumatic, mucous membranes moist Eyes: conjunctiva non-erythematous Neck: supple Cardiovascular: regular rate and rhythm, no m/r/g Pulmonary/Chest: normal work of breathing on room air, lungs clear to auscultation bilaterally Abdominal: soft, non-tender, non-distended MSK: There is moderate right knee joint effusion, and increased warmth of the right knee.  Tenderness to palpation along the right knee joint line.  No popliteal fullness.  Minimal pain with palpation of the patellar tendon.  Increased pain with valgus stress of the right knee.  Varicose veins of the bilateral lower extremities. Neurological: alert & oriented x 3, 5/5 strength in bilateral upper and lower extremities, normal gait Skin: warm and dry Psych: Normal mood and affect   Assessment & Plan:  Essential hypertension Patient hypertensive to 145/73 at today's visit.   Patient was hypertensive at her cardiology visit at the end of March as well to 140/70.  She endorses taking all her medications consistently, endorses taking her medications this morning.  Plan: - Recheck blood pressure next visit - Consider additional blood pressure control if persistently hypertensive.  Chronic HFrEF (heart failure with reduced ejection fraction) (HCC) Last seen by cardiology 03/28/ 2024.  On GDMT for HFrEF, metoprolol, Entresto, spironolactone, Jardiance.  Continues to be euvolemic p.o. on exam today denies shortness of breath.  No swelling of the lower extremity.  Plan: Continue GDMT.  Diabetes mellitus type 2 in obese Patient states that her [facility] Ruths co-pay has gone up to $185, and it is difficult for her to afford this though she is currently paying and taking the medication consistently.  She has had some issues getting her Trulicity filled due to it being on backorder at [place].  Last A1c 01/31/2022 revealed A1c 7.5.  Plan: - Follow-up in 4 weeks for dedicated diabetes follow-up - A1c - I sent to check with [facility] to confirm they had Trulicity, sent prescription to the [facility] but they are on backorder there as well.  I will continue to work with diabetes educators, call pharmacies in order to get this medication filled. - Discontinue Synjardy, split up prescription.  Prescribed Jardiance 10 mg daily, 1000 mg metformin twice daily.  Macrocytic anemia History of macrocytic anemia on prior CBC. Plan: - CBC - Serum B12  Right knee pain Patient presents today with acute on chronic knee pain for past 1 month.  She says the pain has been stable during this time.  She says it is a 4 out of 10 pain which is worse with walking, better with rest heat and NSAIDs.  She has had this pain before and was diagnosed with osteoarthritis.  Was seen in 2009 where right knee x-rays show tricompartmental degenerative joint disease.  She denies fever or chills.  She has taken  ibuprofen, tried Voltaren gel, Ace bandage with little relief.  Right knee radiographs were ordered at today's visit, I personally interpreted these to demonstrate worsening osteoarthritic changes of the right knee.  Plan: Referral to orthopedic surgery for management of osteoarthritis of the right knee. Discussed conservative therapy with Tylenol and NSAIDs   Patient seen with Dr. Willow Ora MD [facility]  PGY-1 Pager: [phone]  Phone: [phone] Date 08/08/2022  Time  5:43 PM

## 2022-08-08 NOTE — Assessment & Plan Note (Addendum)
History of macrocytic anemia on prior CBC. Plan: - CBC - Serum B12

## 2022-08-08 NOTE — Assessment & Plan Note (Addendum)
Patient hypertensive to 145/73 at today's visit.  Patient was hypertensive at her cardiology visit at the end of March as well to 140/70.  She endorses taking all her medications consistently, endorses taking her medications this morning.  Plan: - Recheck blood pressure next visit - Consider additional blood pressure control if persistently hypertensive.

## 2022-08-08 NOTE — Patient Instructions (Addendum)
Thank you, Ms.Melannie A Ding for allowing [name] to provide your care today. Today we discussed knee pain, routine follow up.    I have ordered the following labs for you:  Lab Orders         Hemoglobin A1c         Vitamin B12         CBC no Diff         BMP8+Anion Gap      Referrals ordered today:   Referral Orders         Ambulatory referral to Orthopedic Surgery       I have ordered the following medication/changed the following medications:   Stop the following medications: Medications Discontinued During This Encounter  Medication Reason   Empagliflozin-metFORMIN HCl (SYNJARDY) 05-998 MG TABS    Dulaglutide (TRULICITY) 3 MG/0.5ML SOPN Reorder     Start the following medications: Meds ordered this encounter  Medications   Dulaglutide (TRULICITY) 3 MG/0.5ML SOPN    Sig: Inject 3 mg as directed once a week.    Dispense:  4 mL    Refill:  1   empagliflozin (JARDIANCE) 10 MG TABS tablet    Sig: Take 1 tablet (10 mg total) by mouth daily before breakfast.    Dispense:  90 tablet    Refill:  3   metFORMIN (GLUCOPHAGE) 1000 MG tablet    Sig: Take 1 tablet (1,000 mg total) by mouth 2 (two) times daily with a meal.    Dispense:  60 tablet    Refill:  5     Follow up:  2-4 weeks    We look forward to seeing you next time. Please call our clinic at [phone] if you have any questions or concerns. The best time to call is Monday-Friday from 9am-4pm, but there is someone available 24/7. If after hours or the weekend, call the main hospital number and ask for the Internal Medicine Resident On-Call. If you need medication refills, please notify your pharmacy one week in advance and they will send [name] a request.   Thank you for trusting me with your care. Wishing you the best!  Lovie Macadamia MD [facility]

## 2022-08-08 NOTE — Assessment & Plan Note (Addendum)
Patient presents today with acute on chronic knee pain for past 1 month.  She says the pain has been stable during this time.  She says it is a 4 out of 10 pain which is worse with walking, better with rest heat and NSAIDs.  She has had this pain before and was diagnosed with osteoarthritis.  Was seen in 2009 where right knee x-rays show tricompartmental degenerative joint disease.  She denies fever or chills.  She has taken ibuprofen, tried Voltaren gel, Ace bandage with little relief.  Right knee radiographs were ordered at today's visit, I personally interpreted these to demonstrate worsening osteoarthritic changes of the right knee.  Plan: Referral to orthopedic surgery for management of osteoarthritis of the right knee. Discussed conservative therapy with Tylenol and NSAIDs

## 2022-08-08 NOTE — Telephone Encounter (Signed)
Patient called in stating Trulicity is on national back order. Would like an alternate med sent in.

## 2022-08-09 ENCOUNTER — Other Ambulatory Visit: Payer: Self-pay | Admitting: Student

## 2022-08-09 ENCOUNTER — Other Ambulatory Visit (HOSPITAL_COMMUNITY): Payer: Self-pay

## 2022-08-09 DIAGNOSIS — E669 Obesity, unspecified: Secondary | ICD-10-CM

## 2022-08-09 MED ORDER — TRULICITY 3 MG/0.5ML ~~LOC~~ SOAJ
3.0000 mg | SUBCUTANEOUS | 2 refills | Status: DC
  Filled 2022-08-09: qty 2, 28d supply, fill #0

## 2022-08-09 NOTE — Progress Notes (Signed)
Spoke with [facility], they have one box of Trulicity 3mg  in stock. I will call in this prescription and let the patient know.

## 2022-08-10 LAB — BMP8+ANION GAP
Anion Gap: 13 mmol/L (ref 10.0–18.0)
BUN/Creatinine Ratio: 24 — ABNORMAL HIGH (ref 9–23)
BUN: 18 mg/dL (ref 6–24)
CO2: 25 mmol/L (ref 20–29)
Calcium: 9.5 mg/dL (ref 8.7–10.2)
Chloride: 101 mmol/L (ref 96–106)
Creatinine, Ser: 0.76 mg/dL (ref 0.57–1.00)
Glucose: 179 mg/dL — ABNORMAL HIGH (ref 70–99)
Potassium: 4.2 mmol/L (ref 3.5–5.2)
Sodium: 139 mmol/L (ref 134–144)
eGFR: 92 mL/min/{1.73_m2} (ref >59)

## 2022-08-10 LAB — CBC
Hematocrit: 33.9 % — ABNORMAL LOW (ref 34.0–46.6)
Hemoglobin: 11.1 g/dL (ref 11.1–15.9)
MCH: 32.7 pg (ref 26.6–33.0)
MCHC: 32.7 g/dL (ref 31.5–35.7)
MCV: 100 fL — ABNORMAL HIGH (ref 79–97)
Platelets: 273 10*3/uL (ref 150–450)
RBC: 3.39 x10E6/uL — ABNORMAL LOW (ref 3.77–5.28)
RDW: 12.1 % (ref 11.7–15.4)
WBC: 3.9 10*3/uL (ref 3.4–10.8)

## 2022-08-10 LAB — HEMOGLOBIN A1C
Est. average glucose Bld gHb Est-mCnc: 194 mg/dL
Hgb A1c MFr Bld: 8.4 % — ABNORMAL HIGH (ref 4.8–5.6)

## 2022-08-10 LAB — VITAMIN B12: Vitamin B-12: 298 pg/mL (ref 232–1245)

## 2022-08-15 NOTE — Progress Notes (Signed)
Internal Medicine Clinic Attending  I was physically present during the key portions of the resident provided service and participated in the medical decision making of patient's management care. I reviewed pertinent patient test results.  The assessment, diagnosis, and plan were formulated together and I agree with the documentation in the resident's note.  NSAID use, important for her pain control, could be contributing to uncontrolled HTN.  Miguel Aschoff, MD

## 2022-08-23 ENCOUNTER — Encounter: Payer: BC Managed Care – PPO | Admitting: Student

## 2022-08-24 ENCOUNTER — Other Ambulatory Visit: Payer: Self-pay | Admitting: Physician Assistant

## 2022-08-24 ENCOUNTER — Ambulatory Visit (INDEPENDENT_AMBULATORY_CARE_PROVIDER_SITE_OTHER): Payer: BC Managed Care – PPO | Admitting: Orthopaedic Surgery

## 2022-08-24 DIAGNOSIS — M1711 Unilateral primary osteoarthritis, right knee: Secondary | ICD-10-CM | POA: Diagnosis not present

## 2022-08-24 MED ORDER — LIDOCAINE HCL 1 % IJ SOLN
2.0000 mL | INTRAMUSCULAR | Status: AC | PRN
Start: 2022-08-24 — End: 2022-08-24
  Administered 2022-08-24: 2 mL

## 2022-08-24 MED ORDER — TRAMADOL HCL 50 MG PO TABS: 50.0000 mg | ORAL_TABLET | Freq: Two times a day (BID) | ORAL | 0 refills | Status: DC | PRN

## 2022-08-24 MED ORDER — BUPIVACAINE HCL 0.5 % IJ SOLN
2.0000 mL | INTRAMUSCULAR | Status: AC | PRN
  Administered 2022-08-24: 2 mL via INTRA_ARTICULAR

## 2022-08-24 MED ORDER — METHYLPREDNISOLONE ACETATE 40 MG/ML IJ SUSP
40.0000 mg | INTRAMUSCULAR | Status: AC | PRN
Start: 2022-08-24 — End: 2022-08-24
  Administered 2022-08-24: 40 mg via INTRA_ARTICULAR

## 2022-08-24 NOTE — Progress Notes (Signed)
Office Visit Note   Patient: Sandra [name]           Date of Birth: 1965-03-05           MRN: [id number] Visit Date: 08/24/2022              Requested by: Earl Lagos, MD [address], [address] [place],  [place] [address] PCP: Chauncey Mann, DO   Assessment & Plan: Visit Diagnoses:  1. Unilateral primary osteoarthritis, right knee     Plan: Impression is advanced degenerative joint disease right knee.  Today, we discussed various treatment options to include cortisone injection versus viscosupplementation injection versus total knee replacement surgery.  She would like to first try cortisone injection today.  Follow-up as needed.  Follow-Up Instructions: Return if symptoms worsen or fail to improve.   Orders:  No orders of the defined types were placed in this encounter.  No orders of the defined types were placed in this encounter.     Procedures: Large Joint Inj: R knee on 08/24/2022 3:30 PM Indications: pain Details: 22 G needle  Arthrogram: No  Medications: 40 mg methylPREDNISolone acetate 40 MG/ML; 2 mL lidocaine 1 %; 2 mL bupivacaine 0.5 % Consent was given by the patient. Patient was prepped and draped in the usual sterile fashion.       Clinical Data: No additional findings.   Subjective: Chief Complaint  Patient presents with   Right Knee - Pain    HPI patient is a pleasant 57 year old female who comes in today with right knee pain.  Symptoms have currently been ongoing for the past 3 to 4 weeks.  She does note prior history of right knee pain which has been relieved in the past with knee aspiration.  She denies having cortisone injection.  She does note that she fell about a month ago falling backwards but is unsure whether this affected her knee.  She is currently having pain to the medial aspect worse going from a seated to standing position as well as with walking.  She has been using IcyHot, heat and ice without significant  relief.  Review of Systems as detailed in HPI.  All others reviewed and are negative.   Objective: Vital Signs: There were no vitals taken for this visit.  Physical Exam well-developed well-nourished female no acute distress.  Alert and oriented x 3.  Ortho Exam right knee exam shows varus deformity.  A small effusion.  Range of motion 0 to 110 degrees.  Medial joint line tenderness.  She is neurovascularly intact distally.  Specialty Comments:  No specialty comments available.  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   Viral illness 10/08/2013   Psoriasis 04/29/2013   Obesity 05/16/2012   Chronic HFrEF (heart failure with reduced ejection fraction) (HCC) 02/22/2012   LBBB (left bundle branch block) 01/24/2012   Routine adult health maintenance 09/27/2010   Diabetes mellitus type 2 in obese 11/17/2009   Hyperlipidemia 08/20/2007   Migraine  without aura 09/02/2006   Essential hypertension 07/08/2006   Asthma 11/07/2005   Past Medical History:  Diagnosis Date   Acute pain of left shoulder 06/07/2017   Adenomyosis 06/03/2012   Asthma    as a child   Asthma 11/07/2005   Reported Diagnosis in 2012, no formal spirometry    Cardiomyopathy (HCC)    likely nonischemic DCM per Dr. 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   Perimenopausal menorrhagia 02/20/2012   Perimenopausal menorrhagia 02/20/2012   Psoriasis 04/29/2013   Right foot strain 05/15/2021   Right low back pain 03/02/2014   Routine adult health maintenance 09/27/2010   Swelling of abdominal wall 03/02/2014   Swelling of abdominal wall 03/02/2014   Symptoms of upper respiratory infection (URI) 04/01/2018   Tinnitus 06/01/2020   Viral URI with cough 01/13/2013    Family History  Problem Relation Age of Onset   Hypertension Father    Diabetes Father     Past Surgical History:  Procedure Laterality Date   CHOLECYSTECTOMY  2009   DILATATION & CURETTAGE/HYSTEROSCOPY WITH MYOSURE N/A 12/25/2017   Procedure: DILATATION & CURETTAGE/HYSTEROSCOPY   POLYPECTOMY WITH MYOSURE;  Surgeon: Vick Frees, MD;   Location: [facility];  Service: Gynecology;  Laterality: N/A;   TUBAL LIGATION     Social History   Occupational History   Occupation: Building surveyor  Tobacco Use   Smoking status: Never   Smokeless tobacco: Never  Vaping Use   Vaping status: Never Used  Substance and Sexual Activity   Alcohol use: Yes    Alcohol/week: 0.0 standard drinks of alcohol    Comment: occ   Drug use: No   Sexual activity: Yes    Birth control/protection: Surgical

## 2022-08-25 ENCOUNTER — Other Ambulatory Visit: Payer: Self-pay | Admitting: Internal Medicine

## 2022-08-25 DIAGNOSIS — E1169 Type 2 diabetes mellitus with other specified complication: Secondary | ICD-10-CM

## 2022-09-10 ENCOUNTER — Telehealth (HOSPITAL_COMMUNITY): Payer: Self-pay | Admitting: Licensed Clinical Social Worker

## 2022-09-10 ENCOUNTER — Ambulatory Visit: Payer: BC Managed Care – PPO | Admitting: Student

## 2022-09-10 VITALS — BP 124/74 | HR 92 | Temp 98.4°F | Ht 60.0 in | Wt 199.1 lb

## 2022-09-10 DIAGNOSIS — E669 Obesity, unspecified: Secondary | ICD-10-CM | POA: Diagnosis not present

## 2022-09-10 DIAGNOSIS — Z794 Long term (current) use of insulin: Secondary | ICD-10-CM

## 2022-09-10 DIAGNOSIS — Z0000 Encounter for general adult medical examination without abnormal findings: Secondary | ICD-10-CM

## 2022-09-10 DIAGNOSIS — Z6838 Body mass index (BMI) 38.0-38.9, adult: Secondary | ICD-10-CM

## 2022-09-10 DIAGNOSIS — E1169 Type 2 diabetes mellitus with other specified complication: Secondary | ICD-10-CM | POA: Diagnosis not present

## 2022-09-10 DIAGNOSIS — Z7985 Long-term (current) use of injectable non-insulin antidiabetic drugs: Secondary | ICD-10-CM

## 2022-09-10 DIAGNOSIS — Z7984 Long term (current) use of oral hypoglycemic drugs: Secondary | ICD-10-CM

## 2022-09-10 NOTE — Progress Notes (Signed)
Established Patient Office Visit  Subjective   Patient ID: Sandra [name], female    DOB: 19-Jun-1965  Age: 57 y.o. MRN: [id number]  Chief Complaint  Patient presents with   Diabetes   Follow-up    Patient is a 57 y.o. with a past medical history stated below who presents today for follow-up for diabetes management. Please see problem based assessment and plan for additional details.    Patient last seen by Dr. Rosaura Carpenter on 08/08/22. At that time, patient was having difficulties obtaining Trulicity and the cost of her Kirk Ruths went up to $185. They discussed alternative treatment to Synjardy, including Jardiance 10 mg daily and Metformin 1000 mg BID. A1C from 7/17 8.4%.   Patient reports she still has about 1 week left of Synjardy 05-998 BID with meals, so she has not started taking the Jardiance and Metformin. She was also able to get two boxes of Trulicity and does not need a refill at this time. She reports consistent use of Basaglar 23 units at bedtime. Denies any side effects with any of her medications. Patient denies any dysuria, nausea, vomiting, abdominal pain, diarrhea, or constipation. Has an upcoming appointment on 09/13/22 with [facility].   For health care maintenance, patient reports recent normal pap smear and mammogram at [facility]. Will get results faxed over.    Past Medical History:  Diagnosis Date   Acute pain of left shoulder 06/07/2017   Adenomyosis 06/03/2012   Asthma    as a child   Asthma 11/07/2005   Reported Diagnosis in 2012, no formal spirometry    Cardiomyopathy (HCC)    likely nonischemic DCM per Dr. 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  Cardiovascular:  Negative for chest pain.  Gastrointestinal:  Negative for abdominal pain, diarrhea, nausea and vomiting.  Genitourinary:  Negative for dysuria.  Skin:  Negative for rash.     Objective:     BP 124/74 (BP Location: Right Arm, Patient Position: Sitting, Cuff Size: Small)   Pulse 92   Temp 98.4 F (36.9 C) (Oral)   Ht 5' (1.524 m)   Wt 199 lb 1.6 oz (90.3 kg)   SpO2 100%   BMI 38.88 kg/m  BP Readings from Last 3 Encounters:  09/10/22 124/74   08/08/22 (!) 145/73  04/19/22 (!) 140/70   Wt Readings from Last 3 Encounters:  09/10/22 199 lb 1.6 oz (90.3 kg)  08/08/22 200 lb 3.2 oz (90.8 kg)  04/19/22 198 lb 6.4 oz (90 kg)      Physical Exam HENT:     Head: Normocephalic and atraumatic.     Nose: Nose normal.     Mouth/Throat:     Mouth: Mucous membranes are moist.  Eyes:     Extraocular Movements: Extraocular movements intact.     Pupils: Pupils are equal, round, and reactive to light.  Cardiovascular:     Rate and Rhythm: Normal rate and regular rhythm.     Pulses: Normal pulses.     Heart sounds: Normal heart sounds.  Pulmonary:     Effort: Pulmonary effort is normal.     Breath sounds: Normal breath sounds.  Abdominal:     General: Bowel sounds are normal.     Palpations: Abdomen is soft.  Musculoskeletal:        General: No swelling.  Skin:    General: Skin is warm and dry.  Neurological:     General: No focal deficit present.     Mental Status: She is alert.  Psychiatric:        Mood and Affect: Mood normal.        Behavior: Behavior normal.     No results found for any visits on 09/10/22.  Last hemoglobin A1c Lab Results  Component Value Date   HGBA1C 8.4 (H) 08/08/2022     The ASCVD Risk score (Arnett DK, et al., 2019) failed to calculate for the following reasons:   The valid total cholesterol range is 130 to 320 mg/dL    Assessment & Plan:   Problem List Items Addressed This Visit       Endocrine   Diabetes mellitus type 2 in obese - Primary (Chronic)    Patient tolerating Synjardy 05-998 BID, Trulicity 3 mg/weekly and Basaglar insulin 23 units at bedtime. Patient understands that she will start Jardiance 10 mg daily and Metformin 1000 mg BID when she finishes her last dose of Synjardy in about a week. Will continue Basaglar insulin 23 units at bedtime and Trulicity 3 mg weekly injections. A1C 8.4% on 08/08/22, will repeat at follow up appointment in 2-3 months. Patient understands she  can call office if she has any side effects once she starts the Jardiance and Metformin. She will also be in touch with our office is she has difficulty obtaining her Trulicity refills.          Other   Routine adult health maintenance    Patient with normal mammogram March 2024 and normal pap smear 2023 with [facility]. Patient given release form to have those results faxed in an scanned. Patient also follows with [facility], she will get those results faxed after her upcoming visit. Can discuss flu vaccine, shingles vaccine, and COVID vaccine at follow up appointment in 2-3 months.          Return in about 2  months (around 11/10/2022) for A1C, diabetes medication follow up .   Patient seen with Dr. Criselda Peaches.   Makenleigh Crownover Colbert Coyer, MD

## 2022-09-10 NOTE — Assessment & Plan Note (Signed)
Patient tolerating Synjardy 05-998 BID, Trulicity 3 mg/weekly and Basaglar insulin 23 units at bedtime. Patient understands that she will start Jardiance 10 mg daily and Metformin 1000 mg BID when she finishes her last dose of Synjardy in about a week. Will continue Basaglar insulin 23 units at bedtime and Trulicity 3 mg weekly injections. A1C 8.4% on 08/08/22, will repeat at follow up appointment in 2-3 months. Patient understands she can call office if she has any side effects once she starts the Jardiance and Metformin. She will also be in touch with our office is she has difficulty obtaining her Trulicity refills.

## 2022-09-10 NOTE — Patient Instructions (Addendum)
Thank you, Ms.Marlene A Hearne for allowing [name] to provide your care today. Today we discussed your diabetes medications.   I have ordered the following labs for you:  Lab Orders  No laboratory test(s) ordered today     Tests ordered today:  None  Referrals ordered today:   Referral Orders  No referral(s) requested today     I have ordered the following medication/changed the following medications:   Stop the following medications: There are no discontinued medications.   Start the following medications: No orders of the defined types were placed in this encounter.    Follow up:   - Please be sure to go to your 09/13/22 appointment with [facility].  - Please start your Jardiance 10 mg daily and Metformin 1000 mg twice a day once you are finished with your Synjardy. Continue your Basaglar insulin 23 units at bedtime and your Trulicity 3 mg one a week.  - Your A1C was 8.4% on 08/08/22, can repeat in October/November.  - Please forward your records for most recent pap smear and most recent mammogram from [facility].   Remember:   Should you have any questions or concerns please call the internal medicine clinic at [phone].     Jaziel Bennett Colbert Coyer, MD PGY-1 Internal Medicine Teaching Progam [facility]

## 2022-09-10 NOTE — Telephone Encounter (Signed)
Sandra [name] has not been engaged in therapy since 10/19/20.  Clinician Jori Moll by phone today at 11:39am to inquire about whether she intends to make a followup appointment soon, and offer assistance with scheduling and referral resources if needed.  Sandra [name] did not answer this phone call, so clinician left a voicemail informing her that she would be discharged 30 days from now unless she makes an effort to outreach either I or office staff within 30 day window.  Clinician provided contact numbers for our office in order to return call.     Noralee Stain, [place], LCAS 09/10/22

## 2022-09-10 NOTE — Assessment & Plan Note (Signed)
Patient with normal mammogram March 2024 and normal pap smear 2023 with [facility]. Patient given release form to have those results faxed in an scanned. Patient also follows with [facility], she will get those results faxed after her upcoming visit. Can discuss flu vaccine, shingles vaccine, and COVID vaccine at follow up appointment in 2-3 months.

## 2022-09-20 NOTE — Progress Notes (Signed)
 Internal Medicine Clinic Attending  I was physically present during the key portions of the resident provided service and participated in the medical decision making of patient's management care. I reviewed pertinent patient test results.  The assessment, diagnosis, and plan were formulated together and I agree with the documentation in the resident's note.  Inez Catalina, MD

## 2022-10-29 ENCOUNTER — Other Ambulatory Visit: Payer: Self-pay | Admitting: Internal Medicine

## 2022-10-29 DIAGNOSIS — I5022 Chronic systolic (congestive) heart failure: Secondary | ICD-10-CM

## 2022-10-30 ENCOUNTER — Other Ambulatory Visit: Payer: Self-pay | Admitting: Internal Medicine

## 2022-10-30 DIAGNOSIS — E1169 Type 2 diabetes mellitus with other specified complication: Secondary | ICD-10-CM

## 2022-11-02 ENCOUNTER — Telehealth (HOSPITAL_BASED_OUTPATIENT_CLINIC_OR_DEPARTMENT_OTHER): Payer: Self-pay

## 2022-11-02 ENCOUNTER — Encounter (HOSPITAL_BASED_OUTPATIENT_CLINIC_OR_DEPARTMENT_OTHER): Payer: Self-pay | Admitting: Student

## 2022-11-02 ENCOUNTER — Ambulatory Visit (HOSPITAL_BASED_OUTPATIENT_CLINIC_OR_DEPARTMENT_OTHER): Payer: BC Managed Care – PPO | Admitting: Student

## 2022-11-02 DIAGNOSIS — M1711 Unilateral primary osteoarthritis, right knee: Secondary | ICD-10-CM | POA: Diagnosis not present

## 2022-11-02 NOTE — Telephone Encounter (Signed)
Please get auth for right knee gel injection-[place] pt

## 2022-11-02 NOTE — Progress Notes (Signed)
Chief Complaint: Right knee pain     History of Present Illness:    Sandra [name] is a 57 y.o. female presenting today with right knee pain.  She does have a known history of right knee osteoarthritis and was seen by Dr. Roda Shutters on 08/24/2022 at which time she received a cortisone injection.  This did give her good relief, however this began wearing off abruptly yesterday.  She denies any injury but states that her knee began swelling and pain returned yesterday.  Pain levels are moderate to severe.  She has taken ibuprofen and tramadol for pain control and used therapies including Voltaren, Biofreeze, IcyHot, and Epsom salts.  She does have history of type 2 diabetes and last A1c on 08/08/2022 was 8.4.   Surgical History:   None  PMH/PSH/Family History/Social History/Meds/Allergies:    Past Medical History:  Diagnosis Date   Acute pain of left shoulder 06/07/2017   Adenomyosis 06/03/2012   Asthma    as a child   Asthma 11/07/2005   Reported Diagnosis in 2012, no formal spirometry    Cardiomyopathy (HCC)    likely nonischemic DCM per Dr. 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   Perimenopausal menorrhagia 02/20/2012   Perimenopausal menorrhagia 02/20/2012   Psoriasis 04/29/2013   Right foot strain 05/15/2021   Right low back pain 03/02/2014   Routine adult health maintenance 09/27/2010   Swelling of abdominal wall 03/02/2014   Swelling of abdominal wall 03/02/2014   Symptoms of upper respiratory infection (URI) 04/01/2018   Tinnitus 06/01/2020   Viral URI with cough 01/13/2013   Past Surgical History:  Procedure Laterality Date   CHOLECYSTECTOMY  2009   DILATATION & CURETTAGE/HYSTEROSCOPY WITH MYOSURE N/A 12/25/2017   Procedure: DILATATION & CURETTAGE/HYSTEROSCOPY   POLYPECTOMY WITH MYOSURE;  Surgeon: Vick Frees, MD;  Location: [facility];  Service: Gynecology;  Laterality: N/A;   TUBAL LIGATION     Social History   Socioeconomic History   Marital status: Married    Spouse name: Not on file   Number of children: Not on file   Years of education: Not on file   Highest  education level: Not on file  Occupational History   Occupation: Building surveyor  Tobacco Use   Smoking status: Never   Smokeless tobacco: Never  Vaping Use   Vaping status: Never Used  Substance and Sexual Activity   Alcohol use: Yes    Alcohol/week: 0.0 standard drinks of alcohol    Comment: occ   Drug use: No   Sexual activity: Yes    Birth control/protection: Surgical  Other Topics Concern   Not on file  Social History Narrative   Studying at [employer] in early childhood education.   Married.    Social Determinants of Health   Financial Resource Strain: Low Risk  (01/31/2022)   Overall Financial Resource Strain (CARDIA)    Difficulty of Paying Living Expenses: Not hard at all  Food Insecurity: No Food Insecurity (01/31/2022)   Hunger Vital Sign    Worried About Running Out of Food in the Last Year: Never true    Ran Out of Food in the Last Year: Never true  Transportation Needs: No Transportation Needs (01/31/2022)   PRAPARE - Administrator, Civil Service (Medical): No    Lack of Transportation (Non-Medical): No  Physical Activity: Not on file  Stress: No Stress Concern Present (01/31/2022)   [employer] of Occupational Health - Occupational Stress Questionnaire    Feeling of Stress : Not at all  Social Connections: Moderately Integrated (01/31/2022)   Social Connection and Isolation Panel [NHANES]    Frequency of Communication with Friends and Family: More than three times a week    Frequency of Social Gatherings with Friends and Family: More than three times a week    Attends Religious Services: More than 4 times per year    Active Member of [employer] or Organizations: No    Attends Engineer, structural: Never    Marital Status: Married   Family History  Problem Relation Age of Onset   Hypertension Father    Diabetes Father    Allergies  Allergen Reactions   Drug Class [Trazodone And Nefazodone]     Developed suicidal ideation within 3 days of starting   Sertraline Hcl     Developed suicidal ideation within 3d of starting   Current Outpatient Medications  Medication Sig Dispense Refill   albuterol (VENTOLIN HFA) 108 (90 Base) MCG/ACT inhaler Inhale 2 puffs into the lungs every 6 (six) hours as needed for wheezing or shortness of breath. 8 g 6   aspirin EC 81 MG tablet Take 1 tablet (81 mg total) by mouth daily. Swallow whole. 30 tablet 9   atorvastatin (LIPITOR) 80 MG tablet Take 1 tablet by mouth once daily 90 tablet 3   Dulaglutide  (TRULICITY) 3 MG/0.5ML SOPN Inject 3 mg as directed once a week. 2 mL 2   eletriptan (RELPAX) 20 MG tablet Take 1 tablet (20 mg total) by mouth as needed for migraine or headache. May repeat in 2 hours if headache persists or recurs. (Patient not taking: Reported on 08/08/2022) 10 tablet 0   empagliflozin (JARDIANCE) 10 MG TABS tablet Take 1 tablet (10 mg total) by mouth daily before breakfast. 90 tablet 3   escitalopram (LEXAPRO) 10 MG tablet Take 1 tablet by mouth once daily 90 tablet 1   fluticasone (FLONASE) 50 MCG/ACT nasal spray Place 1-2 sprays into both nostrils daily as needed for allergies or rhinitis.     Insulin Glargine (BASAGLAR KWIKPEN) 100 UNIT/ML INJECT 23 UNITS SUBCUTANEOUSLY AT BEDTIME 15 mL 0  Insulin Pen Needle (B-D UF III MINI PEN NEEDLES) 31G X 5 MM MISC 1 Stick by Does not apply route daily. 11.69, insulin requirement 30 each 3   ketotifen (ZADITOR) 0.025 % ophthalmic solution Place 1 drop into both eyes 2 (two) times daily. (Patient not taking: Reported on 08/08/2022) 5 mL 0   metFORMIN (GLUCOPHAGE) 1000 MG tablet Take 1 tablet (1,000 mg total) by mouth 2 (two) times daily with a meal. 60 tablet 5   metoprolol (TOPROL-XL) 200 MG 24 hr tablet Take 1 tablet by mouth once daily 90 tablet 1   naproxen (NAPROSYN) 375 MG tablet Take 1 tablet (375 mg total) by mouth 2 (two) times daily. 20 tablet 0   pantoprazole (PROTONIX) 40 MG tablet Take 1 tablet by mouth once daily 90 tablet 0   sacubitril-valsartan (ENTRESTO) 97-103 MG Take 1 tablet by mouth 2 (two) times daily. 180 tablet 3   spironolactone (ALDACTONE) 25 MG tablet Take 1 tablet by mouth once daily 90 tablet 3   traMADol (ULTRAM) 50 MG tablet Take 1 tablet (50 mg total) by mouth every 12 (twelve) hours as needed. 30 tablet 0   No current facility-administered medications for this visit.   No results found.  Review of Systems:   A ROS was performed including pertinent positives and negatives as documented in the  HPI.  Physical Exam :   Constitutional: NAD and appears stated age Neurological: Alert and oriented Psych: Appropriate affect and cooperative There were no vitals taken for this visit.   Comprehensive Musculoskeletal Exam:    Evaluation of the right knee reveals a mild effusion with slight warmth but no erythema.  Active range of motion from 0 to 90 degrees with some palpable crepitus.  No laxity with varus or valgus stress noted.  Imaging:   Xray review from 08/08/2022 (right knee 4 views): Moderate tricompartmental osteoarthritis most notable in the medial compartment.  Notable peripheral osteophytes of the medial joint line.    I personally reviewed and interpreted the radiographs.   Assessment:   57 y.o. female with right knee osteoarthritis.  Last cortisone injection evidently gave her about 2 months of relief.  Given that she would not be eligible for repeat injection for 3 more weeks as well as recent A1c of 8.4, I did discuss with her the possibility of viscosupplementation.  She had discussed this with Dr. Roda Shutters previously.  She would like to move forward with this, so we will plan to submit for authorization today.  I did offer an aspiration today for symptom relief however we ultimately decided to hold on this and will decide if it is necessary at time of next injection.  I will plan to have her return with me for injection but will continue follow-ups with Dr. Roda Shutters.  Plan :    -Submit authorization for right knee viscosupplementation -Return to clinic for injection procedure -Continue following with Dr. Roda Shutters     I personally saw and evaluated the patient, and participated in the management and treatment plan.  Hazle Nordmann, PA-C Orthopedics

## 2022-11-07 ENCOUNTER — Encounter: Payer: Self-pay | Admitting: Internal Medicine

## 2022-11-07 ENCOUNTER — Ambulatory Visit: Payer: BC Managed Care – PPO | Admitting: Internal Medicine

## 2022-11-07 VITALS — BP 121/60 | HR 93 | Wt 193.5 lb

## 2022-11-07 DIAGNOSIS — E1169 Type 2 diabetes mellitus with other specified complication: Secondary | ICD-10-CM | POA: Diagnosis not present

## 2022-11-07 DIAGNOSIS — Z6837 Body mass index (BMI) 37.0-37.9, adult: Secondary | ICD-10-CM | POA: Diagnosis not present

## 2022-11-07 DIAGNOSIS — Z0000 Encounter for general adult medical examination without abnormal findings: Secondary | ICD-10-CM

## 2022-11-07 DIAGNOSIS — Z794 Long term (current) use of insulin: Secondary | ICD-10-CM

## 2022-11-07 DIAGNOSIS — Z7984 Long term (current) use of oral hypoglycemic drugs: Secondary | ICD-10-CM | POA: Diagnosis not present

## 2022-11-07 LAB — GLUCOSE, CAPILLARY: Glucose-Capillary: 145 mg/dL — ABNORMAL HIGH (ref 70–99)

## 2022-11-07 LAB — POCT GLYCOSYLATED HEMOGLOBIN (HGB A1C): Hemoglobin A1C: 7.4 % — AB (ref 4.0–5.6)

## 2022-11-07 MED ORDER — TRULICITY 4.5 MG/0.5ML ~~LOC~~ SOAJ
4.5000 mg | SUBCUTANEOUS | 3 refills | Status: DC
Start: 2022-11-07 — End: 2023-05-17

## 2022-11-07 MED ORDER — DEXCOM G7 SENSOR MISC
1.0000 | 0 refills | Status: DC
Start: 2022-11-07 — End: 2023-03-27

## 2022-11-07 NOTE — Assessment & Plan Note (Signed)
declined flu shot today

## 2022-11-07 NOTE — Addendum Note (Signed)
Addended by: Elza Rafter on: 11/07/2022 03:05 PM   Modules accepted: Orders

## 2022-11-07 NOTE — Progress Notes (Addendum)
CC: diabetes  HPI:  [name] is a 57 y.o. female living with a history stated below and presents today for a 3 month follow up of her diabetes. Please see problem based assessment and plan for additional details.  Past Medical History:  Diagnosis Date   Acute conjunctivitis, right eye 01/31/2022   Acute pain of left shoulder 06/07/2017   Adenomyosis 06/03/2012   Asthma    as a child   Asthma 11/07/2005   Reported Diagnosis in 2012, no formal spirometry    Cardiomyopathy (HCC)    likely nonischemic DCM per Dr. 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   Perimenopausal  menorrhagia 02/20/2012   Perimenopausal menorrhagia 02/20/2012   Psoriasis 04/29/2013   Right foot strain 05/15/2021   Right low back pain 03/02/2014   Routine adult health maintenance 09/27/2010   Swelling of abdominal wall 03/02/2014   Swelling of abdominal wall 03/02/2014   Symptoms of upper respiratory infection (URI) 04/01/2018   Tinnitus 06/01/2020   Viral illness 10/08/2013   Viral URI with cough 01/13/2013    Current Outpatient Medications on File Prior to Visit  Medication Sig Dispense Refill   albuterol (VENTOLIN HFA) 108 (90 Base) MCG/ACT inhaler Inhale 2 puffs into the lungs every 6 (six) hours as needed for wheezing or shortness of breath. 8 g 6   aspirin EC 81 MG tablet Take 1 tablet (81 mg total) by mouth daily. Swallow whole. 30 tablet 9   atorvastatin (LIPITOR) 80 MG tablet Take 1 tablet by mouth once daily 90 tablet 3   eletriptan (RELPAX) 20 MG tablet Take 1 tablet (20 mg total) by mouth as needed for migraine or headache. May repeat in 2 hours if headache persists or recurs. (Patient not taking: Reported on 08/08/2022) 10 tablet 0   empagliflozin (JARDIANCE) 10 MG TABS tablet Take 1 tablet (10 mg total) by mouth daily before breakfast. 90 tablet 3   escitalopram (LEXAPRO) 10 MG tablet Take 1 tablet by mouth once daily 90 tablet 1   fluticasone (FLONASE) 50 MCG/ACT nasal spray Place 1-2 sprays into both nostrils daily as needed for allergies or rhinitis.     Insulin Glargine (BASAGLAR KWIKPEN) 100 UNIT/ML INJECT 23 UNITS SUBCUTANEOUSLY AT BEDTIME 15 mL 0   Insulin Pen Needle (B-D UF  III MINI PEN NEEDLES) 31G X 5 MM MISC 1 Stick by Does not apply route daily. 11.69, insulin requirement 30 each 3   ketotifen (ZADITOR) 0.025 % ophthalmic solution Place 1 drop into both eyes 2 (two) times daily. (Patient not taking: Reported on 08/08/2022) 5 mL 0   metFORMIN (GLUCOPHAGE) 1000 MG tablet Take 1 tablet (1,000 mg total) by mouth 2 (two) times daily with a meal. 60 tablet 5    metoprolol (TOPROL-XL) 200 MG 24 hr tablet Take 1 tablet by mouth once daily 90 tablet 1   naproxen (NAPROSYN) 375 MG tablet Take 1 tablet (375 mg total) by mouth 2 (two) times daily. 20 tablet 0   pantoprazole (PROTONIX) 40 MG tablet Take 1 tablet by mouth once daily 90 tablet 0   sacubitril-valsartan (ENTRESTO) 97-103 MG Take 1 tablet by mouth 2 (two) times daily. 180 tablet 3   spironolactone (ALDACTONE) 25 MG tablet Take 1 tablet by mouth once daily 90 tablet 3   traMADol (ULTRAM) 50 MG tablet Take 1 tablet (50 mg total) by mouth every 12 (twelve) hours as needed. 30 tablet 0   No current facility-administered medications on file prior to visit.    Family History  Problem Relation Age of Onset   Hypertension Father    Diabetes Father     Social History   Socioeconomic History   Marital status: Married    Spouse name: Not on file   Number of children: Not on file   Years of education: Not on file   Highest education level: Not on file  Occupational History   Occupation: Building surveyor  Tobacco Use   Smoking status: Never   Smokeless tobacco: Never  Vaping Use   Vaping status: Never Used  Substance and Sexual Activity   Alcohol use: Yes    Alcohol/week: 0.0 standard drinks of alcohol    Comment: occ   Drug use: No   Sexual activity: Yes    Birth control/protection: Surgical  Other Topics Concern   Not on file  Social History Narrative   Studying at [employer] in early childhood education.   Married.   Social Determinants of Health   Financial Resource Strain: Low Risk  (01/31/2022)   Overall Financial Resource Strain (CARDIA)    Difficulty of Paying Living Expenses: Not hard at all  Food Insecurity: No Food Insecurity (01/31/2022)   Hunger Vital Sign    Worried About Running Out of Food in the Last Year: Never true    Ran Out of Food in the Last Year: Never true  Transportation Needs: No Transportation Needs (01/31/2022)   PRAPARE - Scientist, research (physical sciences) (Medical): No    Lack of Transportation (Non-Medical): No  Physical Activity: Not on file  Stress: No Stress Concern Present (01/31/2022)   [employer] of Occupational Health - Occupational Stress Questionnaire    Feeling of Stress : Not at all  Social Connections: Moderately Integrated (01/31/2022)   Social Connection and Isolation Panel [NHANES]    Frequency of Communication with Friends and Family: More than three times a week    Frequency of Social Gatherings with Friends and Family: More than three times a week    Attends Religious Services: More than 4 times per year    Active Member of [employer] or Organizations: No    Attends Banker Meetings: Never    Marital Status: Married  Catering manager Violence: Not At Risk (01/31/2022)   Humiliation, Afraid, Rape,  and Kick questionnaire    Fear of Current or Ex-Partner: No    Emotionally Abused: No    Physically Abused: No    Sexually Abused: No    Review of Systems: ROS negative except for what is noted on the assessment and plan.  Vitals:   11/07/22 1426  BP: 121/60  Pulse: 93  SpO2: 98%  Weight: 193 lb 8 oz (87.8 kg)    Physical Exam: Constitutional: well-appearing female sitting in chair, in no acute distress Cardiovascular: regular rate and rhythm Pulmonary/Chest: normal work of breathing on room air MSK: normal bulk and tone Neurological: alert & oriented x 3, no focal deficit Skin: warm and dry Psych: normal mood and behavior  Assessment & Plan:   Patient discussed with Dr. Heide Spark  Type 2 diabetes mellitus with other specified complication [facility]) Patient has type 2 DM associated with HLD and HTN. Her A1c improved from 8.4% to 7.4% today. She is on metformin 1000 mg bid, jardiance 10 mg daily, trulicity 3 mg/week, and basaglar 23u daily. She does not check her blood sugars regularly but denies any s/s of hypoglycemia. The patient states that she has been making lifestyle modifications  to help her diabetes, specifically in regards to eating a healthier diet.  Plan: - Continue metformin 1000 mg bid, jardiance 10 mg daily, and basaglar 23u daily - Increase Trulicity to 4.5 mg/week - Dexcom ordered  - Foot exam today - Urine micro today - Patient is established with [facility] and will be seeing [facility] (referral placed)  Morbid obesity (HCC) BMI>35 associated with type 2 DM and HTN. Counseled on lifestyle modifications  Routine adult health maintenance - declined flu shot today   Sandra [name], D.O. [facility] Internal Medicine, PGY-3 Phone: [phone] Date 11/07/2022 Time 3:04 PM

## 2022-11-07 NOTE — Telephone Encounter (Signed)
VOB submitted for durolane, right knee

## 2022-11-07 NOTE — Assessment & Plan Note (Addendum)
BMI>35 associated with type 2 DM and HTN. Counseled on lifestyle modifications

## 2022-11-07 NOTE — Assessment & Plan Note (Addendum)
Patient has type 2 DM associated with HLD and HTN. Her A1c improved from 8.4% to 7.4% today. She is on metformin 1000 mg bid, jardiance 10 mg daily, trulicity 3 mg/week, and basaglar 23u daily. She does not check her blood sugars regularly but denies any s/s of hypoglycemia. The patient states that she has been making lifestyle modifications to help her diabetes, specifically in regards to eating a healthier diet.  Plan: - Continue metformin 1000 mg bid, jardiance 10 mg daily, and basaglar 23u daily - Increase Trulicity to 4.5 mg/week - Dexcom ordered  - Foot exam today - Urine micro today - Patient is established with [facility] and will be seeing [facility] (referral placed)

## 2022-11-07 NOTE — Patient Instructions (Signed)
Thank you, Ms.Jaiyla A Bost for allowing [name] to provide your care today. Today we discussed:  Diabetes Increase your trulicity to 4.5 mg/week (this is a new pen) Keep taking metformin twice a day Keep taking jardiance once a day Keep using basaglar 23 units once a day I ordered a Dexcom for you to check your blood sugars continuously - once you get this, you can make an appt with [name] at our clinic so she can help you put this on if you have issues  I have ordered the following labs for you:   Lab Orders         Glucose, capillary         Microalbumin / Creatinine Urine Ratio         POC Hbg A1C        Referrals ordered today:   Referral Orders  No referral(s) requested today     I have ordered the following medication/changed the following medications:   Stop the following medications: Medications Discontinued During This Encounter  Medication Reason   Dulaglutide (TRULICITY) 3 MG/0.5ML SOPN      Start the following medications: Meds ordered this encounter  Medications   Continuous Glucose Sensor (DEXCOM G7 SENSOR) MISC    Sig: 1 each by Does not apply route as directed.    Dispense:  1 each    Refill:  0   Dulaglutide (TRULICITY) 4.5 MG/0.5ML SOPN    Sig: Inject 4.5 mg as directed once a week.    Dispense:  3 mL    Refill:  3     Follow up: 3 months    Should you have any questions or concerns please call the internal medicine clinic at [phone].     Elza Rafter, D.O. [facility]

## 2022-11-08 LAB — MICROALBUMIN / CREATININE URINE RATIO
Creatinine, Urine: 119.6 mg/dL
Microalb/Creat Ratio: 20 mg/g{creat} (ref 0–29)
Microalbumin, Urine: 24.4 ug/mL

## 2022-11-08 NOTE — Progress Notes (Signed)
Normal microalb/cr ratio. No changes made.

## 2022-11-13 NOTE — Progress Notes (Signed)
Internal Medicine Clinic Attending  Case discussed with the resident at the time of the visit.  We reviewed the resident's history and exam and pertinent patient test results.  I agree with the assessment, diagnosis, and plan of care documented in the resident's note.  

## 2022-11-16 ENCOUNTER — Other Ambulatory Visit: Payer: Self-pay

## 2022-11-16 DIAGNOSIS — M1711 Unilateral primary osteoarthritis, right knee: Secondary | ICD-10-CM

## 2022-11-18 ENCOUNTER — Other Ambulatory Visit: Payer: Self-pay | Admitting: Internal Medicine

## 2022-11-18 DIAGNOSIS — F411 Generalized anxiety disorder: Secondary | ICD-10-CM

## 2022-11-19 ENCOUNTER — Ambulatory Visit (HOSPITAL_BASED_OUTPATIENT_CLINIC_OR_DEPARTMENT_OTHER): Payer: BC Managed Care – PPO | Admitting: Student

## 2022-11-20 ENCOUNTER — Ambulatory Visit (INDEPENDENT_AMBULATORY_CARE_PROVIDER_SITE_OTHER): Payer: BC Managed Care – PPO | Admitting: Student

## 2022-11-20 ENCOUNTER — Ambulatory Visit (HOSPITAL_BASED_OUTPATIENT_CLINIC_OR_DEPARTMENT_OTHER): Payer: BC Managed Care – PPO | Admitting: Student

## 2022-11-20 ENCOUNTER — Encounter (HOSPITAL_BASED_OUTPATIENT_CLINIC_OR_DEPARTMENT_OTHER): Payer: Self-pay | Admitting: Student

## 2022-11-20 DIAGNOSIS — M1711 Unilateral primary osteoarthritis, right knee: Secondary | ICD-10-CM | POA: Diagnosis not present

## 2022-11-20 MED ORDER — SODIUM HYALURONATE 60 MG/3ML IX PRSY
60.0000 mg | PREFILLED_SYRINGE | Freq: Once | INTRA_ARTICULAR | Status: DC
Start: 2022-11-20 — End: 2023-03-28

## 2022-11-20 MED ORDER — SODIUM HYALURONATE 60 MG/3ML IX PRSY
60.0000 mg | PREFILLED_SYRINGE | INTRA_ARTICULAR | Status: AC | PRN
Start: 2022-11-20 — End: 2022-11-20
  Administered 2022-11-20: 60 mg via INTRA_ARTICULAR

## 2022-11-20 NOTE — Progress Notes (Signed)
     HPI: Patient is here today for Durolane injection of the right knee.  States that her pain in the knee has continued and is overall unchanged since last visit.  Also reports that her knee feels somewhat swollen but this has gone down some.  Denies any other changes.  Physical Exam: No evidence of erythema or warmth of the right knee.  Trace to mild effusion present.  Active range of motion from 0 to 90 degrees.  Tenderness over the medial joint line.   Procedure Note  Patient: Sandra [name]             Date of Birth: February 15, 1965           MRN: [id number]             Visit Date: 11/20/2022  Procedures: Visit Diagnoses:  1. Unilateral primary osteoarthritis, right knee     Large Joint Inj: R knee on 11/20/2022 4:01 PM Indications: pain Details: 22 G 1.5 in needle, anterolateral approach Medications: 60 mg Sodium Hyaluronate 60 MG/3ML Outcome: tolerated well, no immediate complications Procedure, treatment alternatives, risks and benefits explained, specific risks discussed. Consent was given by the patient. Immediately prior to procedure a time out was called to verify the correct patient, procedure, equipment, support staff and site/side marked as required. Patient was prepped and draped in the usual sterile fashion.      Plan: Return to clinic as needed.  Continue following with Dr. Roda Shutters for future management.     Hazle Nordmann, PA-C Orthopedics

## 2022-11-22 ENCOUNTER — Telehealth (HOSPITAL_BASED_OUTPATIENT_CLINIC_OR_DEPARTMENT_OTHER): Payer: Self-pay | Admitting: Student

## 2022-11-22 ENCOUNTER — Other Ambulatory Visit (HOSPITAL_BASED_OUTPATIENT_CLINIC_OR_DEPARTMENT_OTHER): Payer: Self-pay | Admitting: Student

## 2022-11-22 MED ORDER — METHOCARBAMOL 500 MG PO TABS: 500.0000 mg | ORAL_TABLET | Freq: Three times a day (TID) | ORAL | 0 refills | Status: AC | PRN

## 2022-11-22 NOTE — Telephone Encounter (Signed)
Patient states she is having spasms and she is not able to work but she went to work today. Please call patient to advise [phone]

## 2022-11-22 NOTE — Telephone Encounter (Signed)
Sent a Rx for Robaxin to help with spasms.

## 2022-11-22 NOTE — Telephone Encounter (Signed)
Please advise 

## 2022-11-26 ENCOUNTER — Other Ambulatory Visit (HOSPITAL_BASED_OUTPATIENT_CLINIC_OR_DEPARTMENT_OTHER): Payer: Self-pay

## 2022-11-26 ENCOUNTER — Ambulatory Visit (HOSPITAL_BASED_OUTPATIENT_CLINIC_OR_DEPARTMENT_OTHER): Payer: BC Managed Care – PPO | Admitting: Student

## 2022-11-26 ENCOUNTER — Encounter (HOSPITAL_BASED_OUTPATIENT_CLINIC_OR_DEPARTMENT_OTHER): Payer: Self-pay | Admitting: Student

## 2022-11-26 DIAGNOSIS — M1711 Unilateral primary osteoarthritis, right knee: Secondary | ICD-10-CM

## 2022-11-26 MED ORDER — MELOXICAM 15 MG PO TABS
15.0000 mg | ORAL_TABLET | Freq: Every day | ORAL | 0 refills | Status: AC
  Filled 2022-11-26: qty 14, 14d supply, fill #0

## 2022-11-26 NOTE — Progress Notes (Signed)
Chief Complaint: Right knee pain     History of Present Illness:   11/26/22: Patient is here today for follow-up of her right knee.  She had a Durolane gel injection last week on 10/29 and states that her pain is now worse.  As of today she has had significant difficulty weightbearing.  States that her knee has occasionally been giving out.  She has been wearing a knee brace and taking Robaxin, aspirin, and tramadol.  Also has been alternating ice and heat.  Her last cortisone injection was on 08/24/2022 and gave 2 months of relief.  Most recent A1c was 7.4.  11/02/2022: Sandra [name] is a 57 y.o. female presenting today with right knee pain.  She does have a known history of right knee osteoarthritis and was seen by Dr. Roda Shutters on 08/24/2022 at which time she received a cortisone injection.  This did give her good relief, however this began wearing off abruptly yesterday.  She denies any injury but states that her knee began swelling and pain returned yesterday.  Pain levels are moderate to severe.  She has taken ibuprofen and tramadol for pain control and used therapies including Voltaren, Biofreeze, IcyHot, and Epsom salts.  She does have history of type 2 diabetes and last A1c on 08/08/2022 was 8.4.   Surgical History:   None  PMH/PSH/Family History/Social History/Meds/Allergies:    Past Medical History:  Diagnosis Date   Acute conjunctivitis, right eye 01/31/2022   Acute pain of left shoulder 06/07/2017   Adenomyosis 06/03/2012   Asthma    as a child   Asthma 11/07/2005   Reported Diagnosis in 2012, no formal spirometry    Cardiomyopathy (HCC)    likely nonischemic DCM per Dr. 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   Perimenopausal menorrhagia 02/20/2012   Perimenopausal menorrhagia 02/20/2012   Psoriasis 04/29/2013   Right foot strain 05/15/2021   Right low back pain 03/02/2014   Routine adult health maintenance 09/27/2010   Swelling of abdominal wall 03/02/2014   Swelling of abdominal wall 03/02/2014   Symptoms of upper respiratory infection (URI) 04/01/2018  Tinnitus 06/01/2020   Viral illness 10/08/2013   Viral URI with cough 01/13/2013   Past Surgical History:  Procedure Laterality Date   CHOLECYSTECTOMY  2009   DILATATION & CURETTAGE/HYSTEROSCOPY WITH MYOSURE N/A 12/25/2017   Procedure: DILATATION & CURETTAGE/HYSTEROSCOPY   POLYPECTOMY WITH MYOSURE;  Surgeon: Vick Frees, MD;  Location: [facility];  Service: Gynecology;  Laterality: N/A;   TUBAL LIGATION     Social History   Socioeconomic History   Marital status:  Married    Spouse name: Not on file   Number of children: Not on file   Years of education: Not on file   Highest education level: Not on file  Occupational History   Occupation: Building surveyor  Tobacco Use   Smoking status: Never   Smokeless tobacco: Never  Vaping Use   Vaping status: Never Used  Substance and Sexual Activity   Alcohol use: Yes    Alcohol/week: 0.0 standard drinks of alcohol    Comment: occ   Drug use: No   Sexual activity: Yes    Birth control/protection: Surgical  Other Topics Concern   Not on file  Social History Narrative   Studying at [employer] in early childhood education.   Married.   Social Determinants of Health   Financial Resource Strain: Low Risk  (01/31/2022)   Overall Financial Resource Strain (CARDIA)    Difficulty of Paying Living Expenses: Not hard at all  Food Insecurity: No Food Insecurity (01/31/2022)   Hunger Vital Sign    Worried About Running Out of Food in the Last Year: Never true    Ran Out of Food in the Last Year: Never true  Transportation Needs: No Transportation Needs (01/31/2022)   PRAPARE - Administrator, Civil Service (Medical): No    Lack of Transportation (Non-Medical): No  Physical Activity: Not on file  Stress: No Stress Concern Present (01/31/2022)   [employer] of Occupational Health - Occupational Stress Questionnaire    Feeling of Stress : Not at all  Social Connections: Moderately Integrated (01/31/2022)   Social Connection and Isolation Panel [NHANES]    Frequency of Communication with Friends and Family: More than three times a week    Frequency of Social Gatherings with Friends and Family: More than three times a week    Attends Religious Services: More than 4 times per year    Active Member of [employer] or Organizations: No    Attends Engineer, structural: Never    Marital Status: Married   Family History  Problem Relation Age of Onset   Hypertension Father    Diabetes Father     Allergies  Allergen Reactions   Drug Class [Trazodone And Nefazodone]     Developed suicidal ideation within 3 days of starting   Sertraline Hcl     Developed suicidal ideation within 3d of starting   Current Outpatient Medications  Medication Sig Dispense Refill   meloxicam (MOBIC) 15 MG tablet Take 1 tablet (15 mg total) by mouth daily for 14 days. 14 tablet 0   albuterol (VENTOLIN HFA) 108 (90 Base) MCG/ACT inhaler Inhale 2 puffs into the lungs every 6 (six) hours as needed for wheezing or shortness of breath. 8 g 6   aspirin EC 81 MG tablet Take 1 tablet (81 mg total) by mouth daily. Swallow whole. 30 tablet 9   atorvastatin (LIPITOR) 80 MG tablet Take 1 tablet by mouth once daily 90 tablet 3   Continuous Glucose Sensor (DEXCOM G7  SENSOR) MISC 1 each by Does not apply route as directed. 1 each 0   Dulaglutide (TRULICITY) 4.5 MG/0.5ML SOPN Inject 4.5 mg as directed once a week. 3 mL 3   eletriptan (RELPAX) 20 MG tablet Take 1 tablet (20 mg total) by mouth as needed for migraine or headache. May repeat in 2 hours if headache persists or recurs. (Patient not taking: Reported on 08/08/2022) 10 tablet 0   empagliflozin (JARDIANCE) 10 MG TABS tablet Take 1 tablet (10 mg total) by mouth daily before breakfast. 90 tablet 3   escitalopram (LEXAPRO) 10 MG tablet Take 1 tablet by mouth once daily 90 tablet 1   fluticasone (FLONASE) 50 MCG/ACT nasal spray Place 1-2 sprays into both nostrils daily as needed for allergies or rhinitis.     Insulin Glargine (BASAGLAR KWIKPEN) 100 UNIT/ML INJECT 23 UNITS SUBCUTANEOUSLY AT BEDTIME 15 mL 0   Insulin Pen Needle (B-D UF III MINI PEN NEEDLES) 31G X 5 MM MISC 1 Stick by Does not apply route daily. 11.69, insulin requirement 30 each 3   ketotifen (ZADITOR) 0.025 % ophthalmic solution Place 1 drop into both eyes 2 (two) times daily. (Patient not taking: Reported on 08/08/2022) 5 mL 0   metFORMIN (GLUCOPHAGE) 1000 MG tablet Take 1 tablet (1,000 mg total) by mouth  2 (two) times daily with a meal. 60 tablet 5   methocarbamol (ROBAXIN) 500 MG tablet Take 1 tablet (500 mg total) by mouth every 8 (eight) hours as needed for up to 10 days for muscle spasms. 30 tablet 0   metoprolol (TOPROL-XL) 200 MG 24 hr tablet Take 1 tablet by mouth once daily 90 tablet 1   naproxen (NAPROSYN) 375 MG tablet Take 1 tablet (375 mg total) by mouth 2 (two) times daily. 20 tablet 0   pantoprazole (PROTONIX) 40 MG tablet Take 1 tablet by mouth once daily 90 tablet 0   sacubitril-valsartan (ENTRESTO) 97-103 MG Take 1 tablet by mouth 2 (two) times daily. 180 tablet 3   spironolactone (ALDACTONE) 25 MG tablet Take 1 tablet by mouth once daily 90 tablet 3   traMADol (ULTRAM) 50 MG tablet Take 1 tablet (50 mg total) by mouth every 12 (twelve) hours as needed. 30 tablet 0   Current Facility-Administered Medications  Medication Dose Route Frequency Provider Last Rate Last Admin   Sodium Hyaluronate (DUROLANE) intra-articular injection 60 mg  60 mg Intra-articular Once        No results found.  Review of Systems:   A ROS was performed including pertinent positives and negatives as documented in the HPI.  Physical Exam :   Constitutional: NAD and appears stated age Neurological: Alert and oriented Psych: Appropriate affect and cooperative There were no vitals taken for this visit.   Comprehensive Musculoskeletal Exam:    Patient is ambulating without assistive device with antalgic gait.  Tenderness palpation over the medial joint line of the right knee.  Active range of motion from 0 to 90 degrees.  Trace to mild effusion present.  Imaging:     Assessment:   57 y.o. female with right knee osteoarthritis and symptoms consistent with a postinjection inflammatory response after Durolane last week.  She has seen Dr. Roda Shutters previously for the arthritis in this knee so I have ultimately recommended follow-up with him to discuss further management and if she would be a potential surgical  candidate soon as next year due to recent gel injection.  In discussion of treatment options for her current symptoms, patient is  agreeable to trialing a course of meloxicam to combat inflammation.  She may be a candidate for a cortisone injection if needed as her last A1c has trended down but will hold on this today.  Plan :    -Start meloxicam 15 mg -Follow-up next week with Dr. Roda Shutters for further management     I personally saw and evaluated the patient, and participated in the management and treatment plan.  Hazle Nordmann, PA-C Orthopedics

## 2022-12-06 ENCOUNTER — Ambulatory Visit: Payer: BC Managed Care – PPO | Admitting: Orthopaedic Surgery

## 2022-12-06 ENCOUNTER — Encounter: Payer: Self-pay | Admitting: Orthopaedic Surgery

## 2022-12-06 ENCOUNTER — Telehealth: Payer: Self-pay | Admitting: Orthopaedic Surgery

## 2022-12-06 ENCOUNTER — Other Ambulatory Visit (INDEPENDENT_AMBULATORY_CARE_PROVIDER_SITE_OTHER): Payer: BC Managed Care – PPO

## 2022-12-06 DIAGNOSIS — M1711 Unilateral primary osteoarthritis, right knee: Secondary | ICD-10-CM | POA: Diagnosis not present

## 2022-12-06 NOTE — Progress Notes (Signed)
Office Visit Note   Patient: Sandra [name]           Date of Birth: [birth year]           MRN: [id number] Visit Date: 12/06/2022              Requested by: Chauncey Mann, DO [address] [place],  [place] [address] PCP: Chauncey Mann, DO   Assessment & Plan: Visit Diagnoses:  1. Primary osteoarthritis of right knee     Plan: Impression is severe right knee degenerative joint disease secondary to Osteoarthritis.  Bone on bone joint space narrowing is seen on radiographs with mild varus alignment.  At this point, conservative treatments fail to provide any significant relief and the pain is severely affecting ADLs and quality of life.  Based on treatment options, the patient has elected to move forward with a knee replacement.  We have discussed the surgical risks that include but are not limited to infection, DVT, leg length discrepancy, stiffness, numbness, tingling, incomplete relief of pain.  Recovery and prognosis were also reviewed.    Eunice Blase will call the patient to confirm surgery date once we have the necessary clearances.  Anticoagulants: aspirin 81 mg daily Postop anticoagulation: Aspirin 81 mg Diabetic: Yes 7.4 4 weeks ago.  Will need to recheck closer to time of surgery Nickel allergy: No Prior DVT/PE: No Tobacco use: No Clearances needed for surgery: Rayann Gladstone Lighter Anticipated discharge dispo: Home   Due to the advanced nature of the DJD she may need periodic days off of work based on flareup of symptoms.  Follow-Up Instructions: No follow-ups on file.   Orders:  No orders of the defined types were placed in this encounter.  No orders of the defined types were placed in this encounter.     Procedures: No procedures performed   Clinical Data: No additional findings.   Subjective: Chief Complaint  Patient presents with   Right Knee - Pain, Follow-up    HPI Skilynn is a 57 year old female who returns today for follow-up  evaluation for right knee DJD.  She is experiencing more frequent giving way sensation.  Her pain has gotten worse.  She feels that the cortisone injection helped for less than 3 months and the Visco injection made the pain worse.  Review of Systems  Constitutional: Negative.   HENT: Negative.    Eyes: Negative.   Respiratory: Negative.    Cardiovascular: Negative.   Endocrine: Negative.   Musculoskeletal: Negative.   Neurological: Negative.   Hematological: Negative.   Psychiatric/Behavioral: Negative.    All other systems reviewed and are negative.    Objective: Vital Signs: There were no vitals taken for this visit.  Physical Exam Vitals and nursing note reviewed.  Constitutional:      Appearance: She is well-developed.  HENT:     Head: Normocephalic and atraumatic.  Pulmonary:     Effort: Pulmonary effort is normal.  Abdominal:     Palpations: Abdomen is soft.  Musculoskeletal:     Cervical back: Neck supple.  Skin:    General: Skin is warm.     Capillary Refill: Capillary refill takes less than 2 seconds.  Neurological:     Mental Status: She is alert and oriented to person, place, and time.  Psychiatric:        Behavior: Behavior normal.        Thought Content: Thought content normal.        Judgment: Judgment  normal.     Ortho Exam Exam of the right knee shows pain and crepitus throughout range of motion.  Collaterals are stable.  No joint effusion.  Medial and lateral joint line tenderness. Specialty Comments:  No specialty comments available.  Imaging: No results found.   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/2017   Psoriasis 04/29/2013   Morbid obesity (HCC) 05/16/2012   Chronic HFrEF (heart failure with reduced ejection fraction) (HCC) 02/22/2012   LBBB (left bundle branch block) 01/24/2012   Routine adult health maintenance 09/27/2010   Type 2 diabetes mellitus with other specified complication (HCC) 11/17/2009   Hyperlipidemia 08/20/2007   Migraine without aura 09/02/2006   Essential hypertension 07/08/2006   Asthma 11/07/2005   Past Medical History:  Diagnosis Date   Acute conjunctivitis, right eye 01/31/2022   Acute pain of left shoulder 06/07/2017   Adenomyosis 06/03/2012   Asthma    as a child   Asthma 11/07/2005   Reported Diagnosis in 2012, no formal spirometry    Cardiomyopathy (HCC)    likely nonischemic DCM per Dr. 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 ASCVD risk of 14.5%    LBBB (left bundle branch block)    Left sided numbness 10/15/2019   Lipoma 02/23/2020   Menorrhagia     Migraine    Normal CT head (08/06/2006)   Migraine without aura 09/02/2006   Fairly Well-controlled  2 per month  Takes Elitriptan    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   Perimenopausal menorrhagia 02/20/2012   Perimenopausal menorrhagia 02/20/2012   Psoriasis 04/29/2013   Right foot strain 05/15/2021   Right low back pain 03/02/2014   Routine adult health maintenance 09/27/2010   Swelling of abdominal wall 03/02/2014   Swelling of abdominal wall 03/02/2014   Symptoms of upper respiratory infection (URI) 04/01/2018   Tinnitus 06/01/2020   Viral illness 10/08/2013   Viral URI with cough 01/13/2013    Family History  Problem Relation Age of Onset   Hypertension Father    Diabetes Father     Past Surgical History:  Procedure Laterality Date   CHOLECYSTECTOMY  2009   DILATATION & CURETTAGE/HYSTEROSCOPY WITH MYOSURE N/A 12/25/2017   Procedure: DILATATION & CURETTAGE/HYSTEROSCOPY   POLYPECTOMY WITH MYOSURE;  Surgeon: Vick Frees, MD;  Location: [facility];  Service: Gynecology;  Laterality: N/A;  TUBAL LIGATION     Social History   Occupational History   Occupation: Building surveyor  Tobacco Use   Smoking status: Never   Smokeless tobacco: Never  Vaping Use   Vaping status: Never Used  Substance and Sexual Activity   Alcohol use: Yes    Alcohol/week: 0.0 standard drinks of alcohol    Comment: occ   Drug use: No   Sexual activity: Yes    Birth control/protection: Surgical

## 2022-12-06 NOTE — Telephone Encounter (Signed)
FMLA form, Berkley Harvey $25 cash received from patient. To [employer].

## 2022-12-17 ENCOUNTER — Telehealth: Payer: Self-pay | Admitting: *Deleted

## 2022-12-17 NOTE — Telephone Encounter (Signed)
   Pre-operative Risk Assessment    Patient Name: Sandra [name]  DOB: 06-Jul-1965 MRN: [id number]  DATE OF LAST VISIT: 04/19/22 DR. NISHAN DATE OF NEXT VISIT: NONE    Request for Surgical Clearance    Procedure:   RIGHT TOTAL KNEE  Date of Surgery:  Clearance TBD                                 Surgeon:  DR. Cheral Almas Surgeon's Group or Practice Name:  [facility] Phone number:  [phone] Fax number:  [phone]   Type of Clearance Requested:   - Medical  - Pharmacy:  Hold Aspirin     Type of Anesthesia:  Spinal   Additional requests/questions:    Elpidio Anis   12/17/2022, 5:59 PM

## 2022-12-19 NOTE — Telephone Encounter (Signed)
   Name: Sandra [name]  DOB: 07/27/1965  MRN: [id number]  Primary Cardiologist: Charlton Haws, MD   Preoperative team, please contact this patient and set up a phone call appointment for further preoperative risk assessment. Please obtain consent and complete medication review. Thank you for your help.  I confirm that guidance regarding antiplatelet and oral anticoagulation therapy has been completed and, if necessary, noted below.  Patient's aspirin is not prescribed by cardiology.  Recommendations for holding aspirin will need to come from primary care provider.  I also confirmed the patient resides in the state of [place]. As per [facility] Medical Board telemedicine laws, the patient must reside in the state in which the provider is licensed.   Ronney Asters, NP 12/19/2022, 11:31 AM [facility]

## 2022-12-24 ENCOUNTER — Other Ambulatory Visit: Payer: Self-pay | Admitting: Internal Medicine

## 2022-12-24 DIAGNOSIS — K219 Gastro-esophageal reflux disease without esophagitis: Secondary | ICD-10-CM

## 2022-12-25 NOTE — Telephone Encounter (Signed)
I called to s/w the pt and her husband answered the phone. He tells me she in having her eye surgery right now. I asked him would it be better if we call her tomorrow to discuss further about the request for her knee surgery that we just got a request for. Husband said yes that will be good and thank you.

## 2022-12-31 NOTE — Telephone Encounter (Signed)
Left message to call back to schedule tele preop appt 12/9.

## 2023-01-01 ENCOUNTER — Telehealth: Payer: Self-pay | Admitting: *Deleted

## 2023-01-01 NOTE — Telephone Encounter (Signed)
S/w pt to go over medications. Pt needs to call back due to did not know medications. Pt will need all bottles. Consent done and pre op appt made.     Patient Consent for Virtual Visit         [name] has provided verbal consent on 01/01/2023 for a virtual visit (video or telephone).   CONSENT FOR VIRTUAL VISIT FOR:  Sandra [name]  By participating in this virtual visit I agree to the following:  I hereby voluntarily request, consent and authorize [facility] and its employed or contracted physicians, physician assistants, nurse practitioners or other licensed health care professionals (the Practitioner), to provide me with telemedicine health care services (the "Services") as deemed necessary by the treating Practitioner. I acknowledge and consent to receive the Services by the Practitioner via telemedicine. I understand that the telemedicine visit will involve communicating with the Practitioner through live audiovisual communication technology and the disclosure of certain medical information by electronic transmission. I acknowledge that I have been given the opportunity to request an in-person assessment or other available alternative prior to the telemedicine visit and am voluntarily participating in the telemedicine visit.  I understand that I have the right to withhold or withdraw my consent to the use of telemedicine in the course of my care at any time, without affecting my right to future care or treatment, and that the Practitioner or I may terminate the telemedicine visit at any time. I understand that I have the right to inspect all information obtained and/or recorded in the course of the telemedicine visit and may receive copies of available information for a reasonable fee.  I understand that some of the potential risks of receiving the Services via telemedicine include:  Delay or interruption in medical evaluation due to technological equipment failure or  disruption; Information transmitted may not be sufficient (e.g. poor resolution of images) to allow for appropriate medical decision making by the Practitioner; and/or  In rare instances, security protocols could fail, causing a breach of personal health information.  Furthermore, I acknowledge that it is my responsibility to provide information about my medical history, conditions and care that is complete and accurate to the best of my ability. I acknowledge that Practitioner's advice, recommendations, and/or decision may be based on factors not within their control, such as incomplete or inaccurate data provided by me or distortions of diagnostic images or specimens that may result from electronic transmissions. I understand that the practice of medicine is not an exact science and that Practitioner makes no warranties or guarantees regarding treatment outcomes. I acknowledge that a copy of this consent can be made available to me via my patient portal [facility] MyChart), or I can request a printed copy by calling the office of [facility].    I understand that my insurance will be billed for this visit.   I have read or had this consent read to me. I understand the contents of this consent, which adequately explains the benefits and risks of the Services being provided via telemedicine.  I have been provided ample opportunity to ask questions regarding this consent and the Services and have had my questions answered to my satisfaction. I give my informed consent for the services to be provided through the use of telemedicine in my medical care

## 2023-01-01 NOTE — Telephone Encounter (Signed)
Pt called back with pill bottles. Went over all medications. Medication list updated. Confirmed appt time and date.

## 2023-01-02 ENCOUNTER — Other Ambulatory Visit (HOSPITAL_COMMUNITY): Payer: Self-pay

## 2023-01-02 MED ORDER — IBUPROFEN 600 MG PO TABS
600.0000 mg | ORAL_TABLET | Freq: Four times a day (QID) | ORAL | 1 refills | Status: DC | PRN
  Filled 2023-01-02: qty 20, 5d supply, fill #0

## 2023-01-02 MED ORDER — AMOXICILLIN 500 MG PO CAPS
500.0000 mg | ORAL_CAPSULE | Freq: Three times a day (TID) | ORAL | 0 refills | Status: DC
  Filled 2023-01-02: qty 21, 7d supply, fill #0

## 2023-01-29 ENCOUNTER — Encounter: Payer: Self-pay | Admitting: Nurse Practitioner

## 2023-01-29 ENCOUNTER — Ambulatory Visit: Payer: BC Managed Care – PPO | Attending: Nurse Practitioner | Admitting: Nurse Practitioner

## 2023-01-29 DIAGNOSIS — Z01810 Encounter for preprocedural cardiovascular examination: Secondary | ICD-10-CM

## 2023-01-29 NOTE — Progress Notes (Signed)
 Virtual Visit via Telephone Note   Because of Loan A Hagood's co-morbid illnesses, she is at least at moderate risk for complications without adequate follow up.  This format is felt to be most appropriate for this patient at this time.  The patient did not have access to video technology/had technical difficulties with video requiring transitioning to audio format only (telephone).  All issues noted in this document were discussed and addressed.  No physical exam could be performed with this format.  Please refer to the patient's chart for her consent to telehealth for [facility].  Evaluation Performed:  Preoperative cardiovascular risk assessment _____________   Date:  01/29/2023   Patient ID:  Sandra [name], DOB [birth year], MRN [id number] Patient Location:  Home Provider location:   Office  Primary Care Provider:  Jimmy Anna SAILOR, DO Primary Cardiologist:  Maude Emmer, MD  Chief Complaint / Patient Profile   58 y.o. y/o female with a h/o left bundle branch block, DCM with most recent EF 50-55% on echo 03/2021, low risk nuclear stress test 04/2017, hyperlipidemia, type 2 diabetes, anxiety and depression who is pending right total knee replacement with Dr. Jerri on date TBD and presents today for telephonic preoperative cardiovascular risk assessment.  History of Present Illness    Sandra [name] is a 58 y.o. female who presents via audio/video conferencing for a telehealth visit today.  Pt was last seen in cardiology clinic on 04/19/2022 by Dr. Emmer.  At that time Sandra [name] was doing well.  The patient is now pending procedure as outlined above. Since her last visit, she denies chest pain, shortness of breath, lower extremity edema, fatigue, palpitations, melena, hematuria, hemoptysis, diaphoresis, weakness, presyncope, syncope, orthopnea, and PND.  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 She is able to achieve > 4 METS activity without concerning cardiac symptoms.   Past Medical History    Past Medical History:  Diagnosis Date   Acute conjunctivitis, right eye 01/31/2022   Acute pain of left shoulder 06/07/2017   Adenomyosis 06/03/2012   Asthma    as a child   Asthma 11/07/2005   Reported Diagnosis in 2012, no formal spirometry    Cardiomyopathy (HCC)    likely nonischemic DCM per Dr. 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   Perimenopausal menorrhagia 02/20/2012   Perimenopausal menorrhagia 02/20/2012   Psoriasis 04/29/2013   Right foot strain 05/15/2021   Right  low back pain 03/02/2014   Routine adult health maintenance 09/27/2010   Swelling of abdominal wall 03/02/2014   Swelling of abdominal wall 03/02/2014   Symptoms of upper respiratory infection (URI) 04/01/2018   Tinnitus 06/01/2020   Viral illness 10/08/2013   Viral URI with cough 01/13/2013   Past Surgical History:  Procedure Laterality Date   CHOLECYSTECTOMY  2009   DILATATION & CURETTAGE/HYSTEROSCOPY WITH MYOSURE N/A 12/25/2017   Procedure: DILATATION & CURETTAGE/HYSTEROSCOPY   POLYPECTOMY WITH MYOSURE;  Surgeon: Linnell Devere BRAVO, MD;  Location: [facility];  Service: Gynecology;  Laterality: N/A;   TUBAL LIGATION      Allergies  Allergies  Allergen Reactions   Drug Class [Trazodone  And Nefazodone]     Developed suicidal ideation within 3 days of starting   Sertraline  Hcl     Developed suicidal ideation within 3d of starting    Home Medications    Prior to Admission medications   Medication Sig Start Date End Date Taking? Authorizing Provider  amoxicillin  (AMOXIL ) 500 MG capsule Take 1 capsule (500 mg total) by mouth 3 (three) times daily until gone. 01/02/23     aspirin  EC 81 MG tablet Take 1 tablet (81 mg total) by mouth daily. Swallow whole. 02/23/21   Atway, Rayann N, DO  atorvastatin  (LIPITOR ) 80 MG tablet Take 1 tablet by mouth once daily 05/28/22   Atway, Rayann N, DO  Continuous Glucose Sensor (DEXCOM G7 SENSOR) MISC 1 each by Does not apply route as directed. 11/07/22   Atway, Rayann N, DO  Dulaglutide  (TRULICITY ) 4.5 MG/0.5ML SOPN Inject 4.5 mg as directed once a week. 11/07/22   Atway, Rayann N, DO  empagliflozin  (JARDIANCE ) 10 MG TABS tablet Take 1 tablet (10 mg total) by mouth daily before breakfast. 08/08/22   Gabino Boga, MD  escitalopram  (LEXAPRO ) 10 MG tablet Take 1 tablet by mouth once daily 11/19/22   Atway, Rayann N, DO  finasteride (PROSCAR) 5 MG tablet Take 2.5 mg by mouth daily. 12/05/22   [provider]  fluticasone  (FLONASE ) 50 MCG/ACT nasal  spray Place 1-2 sprays into both nostrils daily as needed for allergies or rhinitis.    [provider]  ibuprofen  (ADVIL ) 600 MG tablet Take 1 tablet (600 mg total) by mouth every 6 (six) hours as needed for pain. 01/02/23     Insulin  Glargine (BASAGLAR  KWIKPEN) 100 UNIT/ML INJECT 23 UNITS SUBCUTANEOUSLY AT BEDTIME 10/30/22   Atway, Rayann N, DO  Insulin  Pen Needle (B-D UF III MINI PEN NEEDLES) 31G X 5 MM MISC 1 Stick by Does not apply route daily. 11.69, insulin  requirement 05/15/21   Atway, Rayann N, DO  ketotifen  (ZADITOR ) 0.025 % ophthalmic solution Place 1 drop into both eyes 2 (two) times daily. 02/02/22   Katsadouros, Vasilios, MD  metFORMIN  (GLUCOPHAGE ) 1000 MG tablet Take 1 tablet (1,000 mg total) by mouth 2 (two) times daily with a meal. 08/08/22   Gabino Boga, MD  methocarbamol  (ROBAXIN ) 500 MG tablet Take 500 mg by mouth every 6 (six) hours as needed for muscle spasms. 07/03/19   [provider]  metoprolol  (TOPROL -XL) 200 MG 24 hr tablet Take 1 tablet by mouth once daily 10/30/22   Atway, Rayann N, DO  pantoprazole  (PROTONIX ) 40 MG tablet Take 1 tablet by mouth  once daily 12/24/22   Atway, Rayann N, DO  sacubitril -valsartan  (ENTRESTO ) 97-103 MG Take 1 tablet by mouth 2 (two) times daily. 07/11/22   Nishan, Peter C, MD  spironolactone  (ALDACTONE ) 25 MG tablet Take 1 tablet by mouth once daily 04/16/22   Atway, Rayann N, DO  traMADol  (ULTRAM ) 50 MG tablet Take 1 tablet (50 mg total) by mouth every 12 (twelve) hours as needed. 08/24/22   Jule Ronal CROME, PA-C    Physical Exam    Vital Signs:  KISCHA ALTICE does not have vital signs available for review today.  Given telephonic nature of communication, physical exam is limited. AAOx3. NAD. Normal affect.  Speech and respirations are unlabored.  Accessory Clinical Findings    None  Assessment & Plan    1.  Preoperative Cardiovascular Risk Assessment: According to the Revised Cardiac Risk Index (RCRI), her  Perioperative Risk of Major Cardiac Event is (%): 11 due to history of CHF, current insulin  use, and high risk surgery. Her Functional Capacity in METs is: 7.68 according to the Duke Activity Status Index (DASI). She denies concerning symptoms of CHF and is doing well from a cardiac perspective. Therefore, based on ACC/AHA guidelines, the patient would be at acceptable risk for the planned procedure without further cardiovascular testing.   The patient was advised that if she develops new symptoms prior to surgery to contact our office to arrange for a follow-up visit, and she verbalized understanding.  Per office protocol, she may hold aspirin  for 5-7 days prior to procedure and should resume as soon as hemodynamically stable postoperatively.    A copy of this note will be routed to requesting surgeon.  Time:   Today, I have spent 10 minutes with the patient with telehealth technology discussing medical history, symptoms, and management plan.    Sandra EMERSON Bane, NP-C  01/29/2023, 2:43 PM [address] Office [phone] Fax [phone]

## 2023-02-06 ENCOUNTER — Other Ambulatory Visit: Payer: Self-pay | Admitting: Internal Medicine

## 2023-02-06 DIAGNOSIS — E1169 Type 2 diabetes mellitus with other specified complication: Secondary | ICD-10-CM

## 2023-02-25 ENCOUNTER — Encounter (HOSPITAL_COMMUNITY): Payer: Self-pay | Admitting: Licensed Clinical Social Worker

## 2023-02-25 ENCOUNTER — Telehealth (HOSPITAL_COMMUNITY): Payer: Self-pay | Admitting: Licensed Clinical Social Worker

## 2023-02-25 NOTE — Telephone Encounter (Signed)
02/25/23 Discharge letter sent certified mail [phone]

## 2023-03-04 ENCOUNTER — Ambulatory Visit (INDEPENDENT_AMBULATORY_CARE_PROVIDER_SITE_OTHER): Payer: BC Managed Care – PPO | Admitting: Internal Medicine

## 2023-03-04 VITALS — BP 160/93 | HR 89 | Temp 98.4°F | Ht 60.0 in | Wt 195.1 lb

## 2023-03-04 DIAGNOSIS — J111 Influenza due to unidentified influenza virus with other respiratory manifestations: Secondary | ICD-10-CM | POA: Diagnosis not present

## 2023-03-04 NOTE — Progress Notes (Signed)
 CC: feeling ill  HPI:  [name] is a 58 y.o. female living with a history stated below and presents today for an acute visit regarding flu like symptoms. Please see problem based assessment and plan for additional details.  Past Medical History:  Diagnosis Date   Acute conjunctivitis, right eye 01/31/2022   Acute pain of left shoulder 06/07/2017   Adenomyosis 06/03/2012   Asthma    as a child   Asthma 11/07/2005   Reported Diagnosis in 2012, no formal spirometry    Cardiomyopathy (HCC)    likely nonischemic DCM per Dr. 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   Perimenopausal  menorrhagia 02/20/2012   Perimenopausal menorrhagia 02/20/2012   Psoriasis 04/29/2013   Right foot strain 05/15/2021   Right low back pain 03/02/2014   Routine adult health maintenance 09/27/2010   Swelling of abdominal wall 03/02/2014   Swelling of abdominal wall 03/02/2014   Symptoms of upper respiratory infection (URI) 04/01/2018   Tinnitus 06/01/2020   Viral illness 10/08/2013   Viral URI with cough 01/13/2013    Current Outpatient Medications on File Prior to Visit  Medication Sig Dispense Refill   amoxicillin  (AMOXIL ) 500 MG capsule Take 1 capsule (500 mg total) by mouth 3 (three) times daily until gone. 21 capsule 0   aspirin  EC 81 MG tablet Take 1 tablet (81 mg total) by mouth daily. Swallow whole. 30 tablet 9   atorvastatin  (LIPITOR ) 80 MG tablet Take 1 tablet by mouth once daily 90 tablet 3   Continuous Glucose Sensor (DEXCOM G7 SENSOR) MISC 1 each by Does not apply route as directed. 1 each 0   Dulaglutide  (TRULICITY ) 4.5 MG/0.5ML SOPN Inject 4.5 mg as directed once a week. 3 mL 3   empagliflozin  (JARDIANCE ) 10 MG TABS tablet Take 1 tablet (10 mg total) by mouth daily before breakfast. 90 tablet 3   escitalopram  (LEXAPRO ) 10 MG tablet Take 1 tablet by mouth once daily 90 tablet 1   finasteride (PROSCAR) 5 MG tablet Take 2.5 mg by mouth daily.     fluticasone  (FLONASE ) 50 MCG/ACT nasal spray Place 1-2 sprays into both nostrils daily as needed for allergies or rhinitis.     ibuprofen  (ADVIL ) 600 MG tablet Take 1 tablet (600 mg total) by mouth every 6 (  six) hours as needed for pain. 20 tablet 1   Insulin  Glargine (BASAGLAR  KWIKPEN) 100 UNIT/ML INJECT 23 UNITS SUBCUTANEOUSLY AT BEDTIME 15 mL 0   Insulin  Pen Needle (B-D UF III MINI PEN NEEDLES) 31G X 5 MM MISC 1 Stick by Does not apply route daily. 11.69, insulin  requirement 30 each 3   ketotifen  (ZADITOR ) 0.025 % ophthalmic solution Place 1 drop into both eyes 2 (two) times daily. 5 mL 0   metFORMIN  (GLUCOPHAGE ) 1000 MG tablet  Take 1 tablet (1,000 mg total) by mouth 2 (two) times daily with a meal. 60 tablet 5   methocarbamol  (ROBAXIN ) 500 MG tablet Take 500 mg by mouth every 6 (six) hours as needed for muscle spasms.     metoprolol  (TOPROL -XL) 200 MG 24 hr tablet Take 1 tablet by mouth once daily 90 tablet 1   pantoprazole  (PROTONIX ) 40 MG tablet Take 1 tablet by mouth once daily 90 tablet 0   sacubitril -valsartan  (ENTRESTO ) 97-103 MG Take 1 tablet by mouth 2 (two) times daily. 180 tablet 3   spironolactone  (ALDACTONE ) 25 MG tablet Take 1 tablet by mouth once daily 90 tablet 3   traMADol  (ULTRAM ) 50 MG tablet Take 1 tablet (50 mg total) by mouth every 12 (twelve) hours as needed. 30 tablet 0   Current Facility-Administered Medications on File Prior to Visit  Medication Dose Route Frequency Provider Last Rate Last Admin   Sodium Hyaluronate (DUROLANE) intra-articular injection 60 mg  60 mg Intra-articular Once         Family History  Problem Relation Age of Onset   Hypertension Father    Diabetes Father     Social History   Socioeconomic History   Marital status: Married    Spouse name: Not on file   Number of children: Not on file   Years of education: Not on file   Highest education level: Not on file  Occupational History   Occupation: Building surveyor  Tobacco Use   Smoking status: Never   Smokeless tobacco: Never  Vaping Use   Vaping status: Never Used  Substance and Sexual Activity   Alcohol use: Yes    Alcohol/week: 0.0 standard drinks of alcohol    Comment: occ   Drug use: No   Sexual activity: Yes    Birth control/protection: Surgical  Other Topics Concern   Not on file  Social History Narrative   Studying at [employer] in early childhood education.   Married.   Social Drivers of Corporate investment banker Strain: Low Risk  (01/31/2022)   Overall Financial Resource Strain (CARDIA)    Difficulty of Paying Living Expenses: Not hard at all  Food Insecurity: No Food Insecurity (01/31/2022)    Hunger Vital Sign    Worried About Running Out of Food in the Last Year: Never true    Ran Out of Food in the Last Year: Never true  Transportation Needs: No Transportation Needs (01/31/2022)   PRAPARE - Administrator, Civil Service (Medical): No    Lack of Transportation (Non-Medical): No  Physical Activity: Not on file  Stress: No Stress Concern Present (01/31/2022)   [employer] of Occupational Health - Occupational Stress Questionnaire    Feeling of Stress : Not at all  Social Connections: Moderately Integrated (01/31/2022)   Social Connection and Isolation Panel [NHANES]    Frequency of Communication with Friends and Family: More than three times a week    Frequency of Social Gatherings with Friends and Family:  More than three times a week    Attends Religious Services: More than 4 times per year    Active Member of Clubs or Organizations: No    Attends Banker Meetings: Never    Marital Status: Married  Catering manager Violence: Not At Risk (01/31/2022)   Humiliation, Afraid, Rape, and Kick questionnaire    Fear of Current or Ex-Partner: No    Emotionally Abused: No    Physically Abused: No    Sexually Abused: No    Review of Systems: ROS negative except for what is noted on the assessment and plan.  Vitals:   03/04/23 1059  BP: (!) 160/93  Pulse: 89  Temp: 98.4 F (36.9 C)  TempSrc: Oral  SpO2: 99%  Weight: 195 lb 1.6 oz (88.5 kg)  Height: 5' (1.524 m)    Physical Exam: Constitutional: ill-appearing female sitting in chair, in no acute distress Cardiovascular: regular rate and rhythm, no m/r/g Pulmonary/Chest: normal work of breathing on room air, lungs clear to auscultation bilaterally MSK: normal bulk and tone Skin: warm and dry Psych: normal mood and behavior  Assessment & Plan:    Patient discussed with Dr.  Lanetta Pion  Influenza The patient presents for an acute visit today as she has been having flu-like symptoms. 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   For the last 2 weeks, she has been having intermittent headaches and some diarrhea. For the last 5-6 days, she has also had nausea, body aches, rhinorrhea, and a dry cough/some mucus production. She has also has a poor appetite, but has been able to keep down some food and is trying to stay hydrated. She has taken aleve  for her headaches and mucinex  for her congestion/rhinorrhea. She denies any fever or vomiting.   We discussed that she more than likely has the flu and that we do not need to test, as it would not change our management. She is out of the window for tamiflu .  Plan: - Advised to continue PRN tylenol /ibuprofen  - Continue PRN mucinex , can also use saline spray, throat lozenges - Work note provided through 2/12 (will call if still not feeling better and if she needs more time off) - Advised to stay hydrated   Khila Papp, D.O. [facility] Internal Medicine, PGY-3 Phone: [phone] Date 03/04/2023 Time 1:17 PM

## 2023-03-04 NOTE — Assessment & Plan Note (Signed)
 The patient presents for an acute visit today as she has been having flu-like symptoms. 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   For the last 2 weeks, she has been having intermittent headaches and some diarrhea. For the last 5-6 days, she has also had nausea, body aches, rhinorrhea, and a dry cough/some mucus production. She has also has a poor appetite, but has been able to keep down some food and is trying to stay hydrated. She has taken aleve  for her headaches and mucinex  for her congestion/rhinorrhea. She denies any fever or vomiting.   We discussed that she more than likely has the flu and that we do not need to test, as it would not change our management. She is out of the window for tamiflu .  Plan: - Advised to continue PRN tylenol /ibuprofen  - Continue PRN mucinex , can also use saline spray, throat lozenges - Work note provided through 2/12 (will call if still not feeling better and if she needs more time off) - Advised to stay hydrated

## 2023-03-04 NOTE — Patient Instructions (Signed)
 Thank you, Ms.Amylee A Tye for allowing us  to provide your care today. Today we discussed:  Flu like symptoms You likely have the flu, but you are out of the window for us  to prescribe tamiflu  Keep taking tylenol /ibuprofen  as needed for body aches, headache Keep taking mucinex  as needed as well, you can also use a saline spray to help with your congestion and throat lozenges if you have a sore throat Stay hydrated  Work note provided  I have ordered the following labs for you:  Lab Orders  No laboratory test(s) ordered today      Referrals ordered today:   Referral Orders  No referral(s) requested today     I have ordered the following medication/changed the following medications:   Stop the following medications: There are no discontinued medications.   Start the following medications: No orders of the defined types were placed in this encounter.    Follow up:  if symptoms worsen or fail to improve     Should you have any questions or concerns please call the internal medicine clinic at [phone].     Mercy Malena, D.O. [facility]

## 2023-03-05 NOTE — Progress Notes (Signed)
Internal Medicine Clinic Attending  Case discussed with the resident at the time of the visit.  We reviewed the resident's history and exam and pertinent patient test results.  I agree with the assessment, diagnosis, and plan of care documented in the resident's note.

## 2023-03-25 ENCOUNTER — Other Ambulatory Visit: Payer: Self-pay | Admitting: Internal Medicine

## 2023-03-25 DIAGNOSIS — I5022 Chronic systolic (congestive) heart failure: Secondary | ICD-10-CM

## 2023-03-26 ENCOUNTER — Ambulatory Visit: Payer: BC Managed Care – PPO | Admitting: Internal Medicine

## 2023-03-26 ENCOUNTER — Other Ambulatory Visit: Payer: Self-pay | Admitting: Internal Medicine

## 2023-03-26 ENCOUNTER — Encounter: Payer: BC Managed Care – PPO | Admitting: Internal Medicine

## 2023-03-26 VITALS — BP 191/98 | HR 91 | Temp 98.4°F | Ht 60.0 in | Wt 203.3 lb

## 2023-03-26 DIAGNOSIS — E1169 Type 2 diabetes mellitus with other specified complication: Secondary | ICD-10-CM | POA: Diagnosis not present

## 2023-03-26 DIAGNOSIS — I5022 Chronic systolic (congestive) heart failure: Secondary | ICD-10-CM | POA: Diagnosis not present

## 2023-03-26 DIAGNOSIS — K219 Gastro-esophageal reflux disease without esophagitis: Secondary | ICD-10-CM | POA: Diagnosis not present

## 2023-03-26 DIAGNOSIS — Z7984 Long term (current) use of oral hypoglycemic drugs: Secondary | ICD-10-CM

## 2023-03-26 DIAGNOSIS — Z7985 Long-term (current) use of injectable non-insulin antidiabetic drugs: Secondary | ICD-10-CM

## 2023-03-26 LAB — POCT GLYCOSYLATED HEMOGLOBIN (HGB A1C): Hemoglobin A1C: 8.1 % — AB (ref 4.0–5.6)

## 2023-03-26 LAB — GLUCOSE, CAPILLARY: Glucose-Capillary: 215 mg/dL — ABNORMAL HIGH (ref 70–99)

## 2023-03-26 MED ORDER — METFORMIN HCL 1000 MG PO TABS: 1000.0000 mg | ORAL_TABLET | Freq: Two times a day (BID) | ORAL | 3 refills | Status: AC

## 2023-03-26 MED ORDER — BASAGLAR KWIKPEN 100 UNIT/ML ~~LOC~~ SOPN: 26.0000 [IU] | PEN_INJECTOR | Freq: Every day | SUBCUTANEOUS | 3 refills | Status: DC

## 2023-03-26 MED ORDER — SPIRONOLACTONE 25 MG PO TABS
25.0000 mg | ORAL_TABLET | Freq: Every day | ORAL | 3 refills | Status: AC
Start: 2023-03-26 — End: ?

## 2023-03-26 MED ORDER — METOPROLOL SUCCINATE ER 200 MG PO TB24: 200.0000 mg | ORAL_TABLET | Freq: Every day | ORAL | 3 refills | Status: AC

## 2023-03-26 MED ORDER — EMPAGLIFLOZIN 10 MG PO TABS: 10.0000 mg | ORAL_TABLET | Freq: Every day | ORAL | 3 refills | Status: DC

## 2023-03-26 NOTE — Assessment & Plan Note (Signed)
 Patient presents for a follow up of her HTN and HFrecEF -last EF 50-55% in March 2023.  Patient's blood pressure is elevated to 174/96 and 191/98 upon recheck.  She endorses a headache that started today, as well as dyspnea on exertion x 1 week, but denies vision changes, dizziness, chest pain, palpitations orthopnea PND or lower extremity swelling.  She states that she did not take any of her blood pressure medications this morning, as she forgot.  Her headache feels similar to previous headaches that she has had, although she usually only gets headaches when she is sick.  On exam, she appears tired but is in no acute distress.  Cranial nerves II through XII intact.  She does have a small subconjunctival hemorrhage in her right eye, but she states that this is secondary to the eye injection that she just had a few days ago.  She has no audible crackles on her lung exam and has no lower extremity edema.  Plan: - Advised patient to take her home metoprolol 200 mg daily, Entresto 97-103 mg twice daily, Jardiance 10 mg daily, and spironolactone 25 mg daily as soon as she gets home - Recheck blood pressure in about 1 hour after taking meds -Strict return precautions given for patient to go to ED -Follow-up in 1 week: If dyspnea on exertion is not improved, consider adding diuretic like lasix

## 2023-03-26 NOTE — Assessment & Plan Note (Signed)
 The patient presents for follow-up of her type 2 diabetes.  Her A1c has increased from 7.4% to 8.1%.  She takes Trulicity 4.5 mg/week, Jardiance 10 mg daily, glargine 23 units nightly, and metformin 1000 mg twice daily, but she states that she has been out of Trulicity for about 2 weeks.  She was also prescribed Dexcom, but states that she was not sure how to put this on.  Plan: - Bring Dexcom to next office visit so we can help her put it on - Continue Trulicity 4.5 mg/week, Jardiance 10 mg daily, metformin 1000 mg twice daily - Increase glargine to 26 units nightly

## 2023-03-26 NOTE — Progress Notes (Signed)
 CC: HTN and DM  HPI:  Sandra [name] is a 58 y.o. female living with a history stated below and presents today for a follow up of her HTN and DM. Please see problem based assessment and plan for additional details.  Past Medical History:  Diagnosis Date   Acute conjunctivitis, right eye 01/31/2022   Acute pain of left shoulder 06/07/2017   Adenomyosis 06/03/2012   Asthma    as a child   Asthma 11/07/2005   Reported Diagnosis in 2012, no formal spirometry    Cardiomyopathy (HCC)    likely nonischemic DCM per Dr. 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   Perimenopausal menorrhagia  02/20/2012   Perimenopausal menorrhagia 02/20/2012   Psoriasis 04/29/2013   Right foot strain 05/15/2021   Right low back pain 03/02/2014   Routine adult health maintenance 09/27/2010   Swelling of abdominal wall 03/02/2014   Swelling of abdominal wall 03/02/2014   Symptoms of upper respiratory infection (URI) 04/01/2018   Tinnitus 06/01/2020   Viral illness 10/08/2013   Viral URI with cough 01/13/2013    Current Outpatient Medications on File Prior to Visit  Medication Sig Dispense Refill   amoxicillin (AMOXIL) 500 MG capsule Take 1 capsule (500 mg total) by mouth 3 (three) times daily until gone. 21 capsule 0   aspirin EC 81 MG tablet Take 1 tablet (81 mg total) by mouth daily. Swallow whole. 30 tablet 9   atorvastatin (LIPITOR) 80 MG tablet Take 1 tablet by mouth once daily 90 tablet 3   Continuous Glucose Sensor (DEXCOM G7 SENSOR) MISC 1 each by Does not apply route as directed. 1 each 0   Dulaglutide (TRULICITY) 4.5 MG/0.5ML SOPN Inject 4.5 mg as directed once a week. 3 mL 3   escitalopram (LEXAPRO) 10 MG tablet Take 1 tablet by mouth once daily 90 tablet 1   finasteride (PROSCAR) 5 MG tablet Take 2.5 mg by mouth daily.     fluticasone (FLONASE) 50 MCG/ACT nasal spray Place 1-2 sprays into both nostrils daily as needed for allergies or rhinitis.     ibuprofen (ADVIL) 600 MG tablet Take 1 tablet (600 mg total) by mouth every 6 (six) hours as needed for pain. 20 tablet 1   Insulin Pen Needle (B-D UF III MINI PEN NEEDLES)  31G X 5 MM MISC 1 Stick by Does not apply route daily. 11.69, insulin requirement 30 each 3   ketotifen (ZADITOR) 0.025 % ophthalmic solution Place 1 drop into both eyes 2 (two) times daily. 5 mL 0   methocarbamol (ROBAXIN) 500 MG tablet Take 500 mg by mouth every 6 (six) hours as needed for muscle spasms.     sacubitril-valsartan (ENTRESTO) 97-103 MG Take 1 tablet by mouth 2 (two) times daily. 180 tablet 3   traMADol (ULTRAM) 50 MG tablet Take 1 tablet (50 mg total)  by mouth every 12 (twelve) hours as needed. 30 tablet 0   Current Facility-Administered Medications on File Prior to Visit  Medication Dose Route Frequency Provider Last Rate Last Admin   Sodium Hyaluronate (DUROLANE) intra-articular injection 60 mg  60 mg Intra-articular Once         Family History  Problem Relation Age of Onset   Hypertension Father    Diabetes Father     Social History   Socioeconomic History   Marital status: Married    Spouse name: Not on file   Number of children: Not on file   Years of education: Not on file   Highest education level: Not on file  Occupational History   Occupation: Building surveyor  Tobacco Use   Smoking status: Never   Smokeless tobacco: Never  Vaping Use   Vaping status: Never Used  Substance and Sexual Activity   Alcohol use: Yes    Alcohol/week: 0.0 standard drinks of alcohol    Comment: occ   Drug use: No   Sexual activity: Yes    Birth control/protection: Surgical  Other Topics Concern   Not on file  Social History Narrative   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   Married.   Social Drivers of Corporate investment banker Strain: Low Risk  (01/31/2022)   Overall Financial Resource Strain (CARDIA)    Difficulty of Paying Living Expenses: Not hard at all  Food Insecurity: No Food Insecurity (01/31/2022)   Hunger Vital Sign    Worried About Running Out of Food in the Last Year: Never true    Ran Out of Food in the Last Year: Never true  Transportation Needs: No Transportation Needs (01/31/2022)   PRAPARE - Administrator, Civil Service (Medical): No    Lack of Transportation (Non-Medical): No  Physical Activity: Not on file  Stress: No Stress Concern Present (01/31/2022)   [employer] of Occupational Health - Occupational Stress Questionnaire    Feeling of Stress : Not at all  Social Connections: Moderately Integrated (01/31/2022)   Social Connection and Isolation Panel [NHANES]    Frequency of  Communication with Friends and Family: More than three times a week    Frequency of Social Gatherings with Friends and Family: More than three times a week    Attends Religious Services: More than 4 times per year    Active Member of [employer] or Organizations: No    Attends Banker Meetings: Never    Marital Status: Married  Catering manager Violence: Not At Risk (01/31/2022)   Humiliation, Afraid, Rape, and Kick questionnaire    Fear of Current or Ex-Partner: No    Emotionally Abused: No    Physically Abused: No    Sexually Abused: No    Review of Systems: ROS negative except for what is noted on the assessment and plan.  Vitals:   03/26/23 1441 03/26/23 1508  BP: Marland Kitchen)  174/96 (!) 191/98  Pulse: 91 91  Temp: 98.4 F (36.9 C)   TempSrc: Oral   SpO2: 99%   Weight: 203 lb 4.8 oz (92.2 kg)   Height: 5' (1.524 m)     Physical Exam: Constitutional: appears tired, but NAD HENT: small conjunctival hemorrhage seen in R eye Cardiovascular: regular rate and rhythm, no m/r/g Pulmonary/Chest: normal work of breathing on room air, no audible crackles appreciated MSK: normal bulk and tone, no LE edema Neurological: alert & oriented x 3, CN II-XII intact Skin: warm and dry Psych: normal mood and behavior  Assessment & Plan:    Patient discussed with Dr. Cleda Daub  Chronic HFrEF (heart failure with reduced ejection fraction) (HCC) Patient presents for a follow up of her HTN and HFrecEF -last EF 50-55% in March 2023.  Patient's blood pressure is elevated to 174/96 and 191/98 upon recheck.  She endorses a headache that started today, as well as dyspnea on exertion x 1 week, but denies vision changes, dizziness, chest pain, palpitations orthopnea PND or lower extremity swelling.  She states that she did not take any of her blood pressure medications this morning, as she forgot.  Her headache feels similar to previous headaches that she has had, although she usually only gets  headaches when she is sick.  On exam, she appears tired but is in no acute distress.  Cranial nerves II through XII intact.  She does have a small subconjunctival hemorrhage in her right eye, but she states that this is secondary to the eye injection that she just had a few days ago.  She has no audible crackles on her lung exam and has no lower extremity edema.  Plan: - Advised patient to take her home metoprolol 200 mg daily, Entresto 97-103 mg twice daily, Jardiance 10 mg daily, and spironolactone 25 mg daily as soon as she gets home - Recheck blood pressure in about 1 hour after taking meds -Strict return precautions given for patient to go to ED -Follow-up in 1 week: If dyspnea on exertion is not improved, consider adding diuretic like lasix    Type 2 diabetes mellitus with other specified complication [facility]) The patient presents for follow-up of her type 2 diabetes.  Her A1c has increased from 7.4% to 8.1%.  She takes Trulicity 4.5 mg/week, Jardiance 10 mg daily, glargine 23 units nightly, and metformin 1000 mg twice daily, but she states that she has been out of Trulicity for about 2 weeks.  She was also prescribed Dexcom, but states that she was not sure how to put this on.  Plan: - Bring Dexcom to next office visit so we can help her put it on - Continue Trulicity 4.5 mg/week, Jardiance 10 mg daily, metformin 1000 mg twice daily - Increase glargine to 26 units nightly  GERD (gastroesophageal reflux disease) Did not have time to discuss GERD today, but at future office visit would re-address this and whether we can taper off of protonix as she has been on this med for years.    Elza Rafter, D.O. [facility] Internal [facility], PGY-3 Phone: [phone] Date 03/26/2023 Time 4:49 PM

## 2023-03-26 NOTE — Telephone Encounter (Signed)
 Medication sent to pharmacy

## 2023-03-26 NOTE — Assessment & Plan Note (Signed)
 Did not have time to discuss GERD today, but at future office visit would re-address this and whether we can taper off of protonix as she has been on this med for years.

## 2023-03-26 NOTE — Patient Instructions (Addendum)
 Thank you, [name] for allowing [name] to provide your care today. Today we discussed:  High blood pressure Go home and take your blood pressure medications, specifically your entresto, metoprolol, spironolactone, and jardiance Check your BP ~1 hr after taking these meds If your blood pressure if still high and your headache is getting worse, if you have vision changes, chest pain, or worsening shortness of breath go to the ER  Diabetes Bring your Dexcom to your next visit and we can help you put it on  Take Trulicity 4.5 mg/week, jardiance once a day, metformin twice a day Increase your basaglar to 26 units at bedtime    I have ordered the following labs for you:   Lab Orders         Glucose, capillary         POC Hbg A1C        Referrals ordered today:   Referral Orders  No referral(s) requested today     I have ordered the following medication/changed the following medications:   Stop the following medications: Medications Discontinued During This Encounter  Medication Reason   spironolactone (ALDACTONE) 25 MG tablet Reorder   empagliflozin (JARDIANCE) 10 MG TABS tablet Reorder   metFORMIN (GLUCOPHAGE) 1000 MG tablet Reorder   Insulin Glargine (BASAGLAR KWIKPEN) 100 UNIT/ML Reorder   metoprolol (TOPROL-XL) 200 MG 24 hr tablet Reorder     Start the following medications: Meds ordered this encounter  Medications   spironolactone (ALDACTONE) 25 MG tablet    Sig: Take 1 tablet (25 mg total) by mouth daily.    Dispense:  90 tablet    Refill:  3   metoprolol (TOPROL-XL) 200 MG 24 hr tablet    Sig: Take 1 tablet (200 mg total) by mouth daily.    Dispense:  90 tablet    Refill:  3   metFORMIN (GLUCOPHAGE) 1000 MG tablet    Sig: Take 1 tablet (1,000 mg total) by mouth 2 (two) times daily with a meal.    Dispense:  180 tablet    Refill:  3   Insulin Glargine (BASAGLAR KWIKPEN) 100 UNIT/ML    Sig: Inject 26 Units into the skin at bedtime.    Dispense:  15 mL     Refill:  3   empagliflozin (JARDIANCE) 10 MG TABS tablet    Sig: Take 1 tablet (10 mg total) by mouth daily before breakfast.    Dispense:  90 tablet    Refill:  3     Follow up:  1 week     Should you have any questions or concerns please call the internal medicine clinic at [phone].     Elza Rafter, D.O. [facility]

## 2023-03-27 ENCOUNTER — Emergency Department (HOSPITAL_COMMUNITY)

## 2023-03-27 ENCOUNTER — Ambulatory Visit: Payer: Self-pay | Admitting: Internal Medicine

## 2023-03-27 ENCOUNTER — Encounter (HOSPITAL_COMMUNITY): Payer: Self-pay | Admitting: Internal Medicine

## 2023-03-27 ENCOUNTER — Other Ambulatory Visit: Payer: Self-pay

## 2023-03-27 ENCOUNTER — Observation Stay (HOSPITAL_COMMUNITY)
Admission: EM | Admit: 2023-03-27 | Discharge: 2023-03-28 | Disposition: A | Discharge status: Home / Self Care | Attending: Internal Medicine | Admitting: Internal Medicine

## 2023-03-27 DIAGNOSIS — F32A Depression, unspecified: Secondary | ICD-10-CM | POA: Insufficient documentation

## 2023-03-27 DIAGNOSIS — E785 Hyperlipidemia, unspecified: Secondary | ICD-10-CM | POA: Insufficient documentation

## 2023-03-27 DIAGNOSIS — I110 Hypertensive heart disease with heart failure: Secondary | ICD-10-CM | POA: Diagnosis not present

## 2023-03-27 DIAGNOSIS — R0602 Shortness of breath: Secondary | ICD-10-CM | POA: Diagnosis present

## 2023-03-27 DIAGNOSIS — E119 Type 2 diabetes mellitus without complications: Secondary | ICD-10-CM | POA: Diagnosis not present

## 2023-03-27 DIAGNOSIS — Z7985 Long-term (current) use of injectable non-insulin antidiabetic drugs: Secondary | ICD-10-CM | POA: Diagnosis not present

## 2023-03-27 DIAGNOSIS — K219 Gastro-esophageal reflux disease without esophagitis: Secondary | ICD-10-CM | POA: Insufficient documentation

## 2023-03-27 DIAGNOSIS — Z79899 Other long term (current) drug therapy: Secondary | ICD-10-CM | POA: Diagnosis not present

## 2023-03-27 DIAGNOSIS — Z7982 Long term (current) use of aspirin: Secondary | ICD-10-CM | POA: Insufficient documentation

## 2023-03-27 DIAGNOSIS — I509 Heart failure, unspecified: Principal | ICD-10-CM

## 2023-03-27 DIAGNOSIS — I5022 Chronic systolic (congestive) heart failure: Secondary | ICD-10-CM | POA: Diagnosis present

## 2023-03-27 DIAGNOSIS — R0609 Other forms of dyspnea: Secondary | ICD-10-CM | POA: Diagnosis present

## 2023-03-27 DIAGNOSIS — I5023 Acute on chronic systolic (congestive) heart failure: Principal | ICD-10-CM | POA: Insufficient documentation

## 2023-03-27 DIAGNOSIS — Z1152 Encounter for screening for COVID-19: Secondary | ICD-10-CM | POA: Insufficient documentation

## 2023-03-27 DIAGNOSIS — Z794 Long term (current) use of insulin: Secondary | ICD-10-CM | POA: Insufficient documentation

## 2023-03-27 LAB — RESP PANEL BY RT-PCR (RSV, FLU A&B, COVID)  RVPGX2
Influenza A by PCR: NEGATIVE
Influenza B by PCR: NEGATIVE
Resp Syncytial Virus by PCR: NEGATIVE
SARS Coronavirus 2 by RT PCR: NEGATIVE

## 2023-03-27 LAB — BASIC METABOLIC PANEL
Anion gap: 12 (ref 5–15)
BUN: 10 mg/dL (ref 6–20)
CO2: 23 mmol/L (ref 22–32)
Calcium: 9.2 mg/dL (ref 8.9–10.3)
Chloride: 104 mmol/L (ref 98–111)
Creatinine, Ser: 0.75 mg/dL (ref 0.44–1.00)
GFR, Estimated: >60 mL/min (ref >60)
Glucose, Bld: 201 mg/dL — ABNORMAL HIGH (ref 70–99)
Potassium: 4 mmol/L (ref 3.5–5.1)
Sodium: 139 mmol/L (ref 135–145)

## 2023-03-27 LAB — RESPIRATORY PANEL BY PCR

## 2023-03-27 LAB — CBC WITH DIFFERENTIAL/PLATELET
Abs Immature Granulocytes: 0 10*3/uL (ref 0.00–0.07)
Basophils Absolute: 0 10*3/uL (ref 0.0–0.1)
Basophils Relative: 1 %
Eosinophils Absolute: 0 10*3/uL (ref 0.0–0.5)
Eosinophils Relative: 1 %
HCT: 31.5 % — ABNORMAL LOW (ref 36.0–46.0)
Hemoglobin: 10.5 g/dL — ABNORMAL LOW (ref 12.0–15.0)
Immature Granulocytes: 0 %
Lymphocytes Relative: 20 %
Lymphs Abs: 0.7 10*3/uL (ref 0.7–4.0)
MCH: 33.9 pg (ref 26.0–34.0)
MCHC: 33.3 g/dL (ref 30.0–36.0)
MCV: 101.6 fL — ABNORMAL HIGH (ref 80.0–100.0)
Monocytes Absolute: 0.5 10*3/uL (ref 0.1–1.0)
Monocytes Relative: 15 %
Neutro Abs: 2.1 10*3/uL (ref 1.7–7.7)
Neutrophils Relative %: 63 %
Platelets: 284 10*3/uL (ref 150–400)
RBC: 3.1 MIL/uL — ABNORMAL LOW (ref 3.87–5.11)
RDW: 13.4 % (ref 11.5–15.5)
WBC: 3.4 10*3/uL — ABNORMAL LOW (ref 4.0–10.5)
nRBC: 0 % (ref 0.0–0.2)

## 2023-03-27 LAB — BRAIN NATRIURETIC PEPTIDE: B Natriuretic Peptide: 147.2 pg/mL — ABNORMAL HIGH (ref 0.0–100.0)

## 2023-03-27 LAB — GLUCOSE, CAPILLARY
Glucose-Capillary: 216 mg/dL — ABNORMAL HIGH (ref 70–99)
Glucose-Capillary: 193 mg/dL — ABNORMAL HIGH (ref 70–99)

## 2023-03-27 LAB — TROPONIN I (HIGH SENSITIVITY)
Troponin I (High Sensitivity): 62 ng/L — ABNORMAL HIGH (ref <18)
Troponin I (High Sensitivity): 59 ng/L — ABNORMAL HIGH (ref <18)

## 2023-03-27 MED ORDER — BASAGLAR KWIKPEN 100 UNIT/ML ~~LOC~~ SOPN: 23.0000 [IU] | PEN_INJECTOR | Freq: Every day | SUBCUTANEOUS | Status: DC

## 2023-03-27 MED ORDER — ESCITALOPRAM OXALATE 10 MG PO TABS
10.0000 mg | ORAL_TABLET | Freq: Every day | ORAL | Status: DC
Start: 2023-03-28 — End: 2023-03-28
  Administered 2023-03-27: 10 mg via ORAL
  Filled 2023-03-27 (×2): qty 1

## 2023-03-27 MED ORDER — ATORVASTATIN CALCIUM 80 MG PO TABS
80.0000 mg | ORAL_TABLET | Freq: Every day | ORAL | Status: DC
  Administered 2023-03-27 – 2023-03-28 (×2): 80 mg via ORAL
  Filled 2023-03-27 (×2): qty 1

## 2023-03-27 MED ORDER — SACUBITRIL-VALSARTAN 97-103 MG PO TABS
1.0000 | ORAL_TABLET | Freq: Two times a day (BID) | ORAL | Status: DC
  Administered 2023-03-27 – 2023-03-28 (×2): 1 via ORAL
  Filled 2023-03-27 (×3): qty 1

## 2023-03-27 MED ORDER — RIVAROXABAN 10 MG PO TABS
10.0000 mg | ORAL_TABLET | Freq: Every day | ORAL | Status: DC
  Administered 2023-03-28: 10 mg via ORAL
  Filled 2023-03-27 (×2): qty 1

## 2023-03-27 MED ORDER — METOPROLOL SUCCINATE ER 100 MG PO TB24
200.0000 mg | ORAL_TABLET | Freq: Every day | ORAL | Status: DC
  Administered 2023-03-28: 200 mg via ORAL
  Filled 2023-03-27: qty 2

## 2023-03-27 MED ORDER — INSULIN ASPART 100 UNIT/ML IJ SOLN
0.0000 [IU] | Freq: Three times a day (TID) | INTRAMUSCULAR | Status: DC
  Administered 2023-03-27 – 2023-03-28 (×2): 5 [IU] via SUBCUTANEOUS
  Administered 2023-03-28: 3 [IU] via SUBCUTANEOUS

## 2023-03-27 MED ORDER — ACETAMINOPHEN 325 MG PO TABS
650.0000 mg | ORAL_TABLET | Freq: Four times a day (QID) | ORAL | Status: DC | PRN
  Administered 2023-03-28: 650 mg via ORAL
  Filled 2023-03-27 (×2): qty 2

## 2023-03-27 MED ORDER — METFORMIN HCL 500 MG PO TABS
1000.0000 mg | ORAL_TABLET | Freq: Two times a day (BID) | ORAL | Status: DC
  Administered 2023-03-27 – 2023-03-28 (×2): 1000 mg via ORAL
  Filled 2023-03-27 (×2): qty 2

## 2023-03-27 MED ORDER — FUROSEMIDE 10 MG/ML IJ SOLN
40.0000 mg | Freq: Once | INTRAMUSCULAR | Status: AC
  Administered 2023-03-27: 40 mg via INTRAVENOUS
  Filled 2023-03-27: qty 4

## 2023-03-27 MED ORDER — EMPAGLIFLOZIN 10 MG PO TABS
10.0000 mg | ORAL_TABLET | Freq: Every day | ORAL | Status: DC
  Administered 2023-03-28: 10 mg via ORAL
  Filled 2023-03-27: qty 1

## 2023-03-27 MED ORDER — SPIRONOLACTONE 25 MG PO TABS
25.0000 mg | ORAL_TABLET | Freq: Every day | ORAL | Status: DC
  Administered 2023-03-28: 25 mg via ORAL
  Filled 2023-03-27: qty 1

## 2023-03-27 MED ORDER — INSULIN GLARGINE 100 UNIT/ML ~~LOC~~ SOLN: 23.0000 [IU] | Freq: Every day | SUBCUTANEOUS | Status: DC

## 2023-03-27 MED ORDER — ASPIRIN 81 MG PO TBEC
81.0000 mg | DELAYED_RELEASE_TABLET | Freq: Every day | ORAL | Status: DC
  Administered 2023-03-28: 81 mg via ORAL
  Filled 2023-03-27: qty 1

## 2023-03-27 MED ORDER — PANTOPRAZOLE SODIUM 40 MG PO TBEC
40.0000 mg | DELAYED_RELEASE_TABLET | Freq: Every day | ORAL | Status: DC
  Administered 2023-03-27 – 2023-03-28 (×2): 40 mg via ORAL
  Filled 2023-03-27 (×2): qty 1

## 2023-03-27 NOTE — ED Provider Triage Note (Signed)
 Emergency Medicine Provider Triage Evaluation Note  Sandra [name] , a 58 y.o. female  was evaluated in triage.  Pt complains of elevated blood pressure.  She did miss a few doses of her medication earlier in the week.  She has been having some shortness of breath, mostly on exertion for the last few days.  A little bit of cough.  Sometimes she will have some chest discomfort when she gets short of breath.  None currently.  No leg swelling.  She is out of her diabetes medication because the pharmacy does not have it in stock..  Review of Systems  Positive: Shortness of breath, some mild chest discomfort Negative: Leg swelling, fever  Physical Exam  BP (!) 164/87 (BP Location: Right Arm)   Pulse 84   Temp 98.5 F (36.9 C)   Resp 15   SpO2 99%  Gen:   Awake, no distress    Resp:  Normal effort   MSK:   Moves extremities without difficulty   Other:     Medical Decision Making  Medically screening exam initiated at 10:12 AM.  Appropriate orders placed.  Sandra [name] was informed that the remainder of the evaluation will be completed by another provider, this initial triage assessment does not replace that evaluation, and the importance of remaining in the ED until their evaluation is complete.      Rolan Bucco, MD 03/27/23 [phone]

## 2023-03-27 NOTE — ED Provider Notes (Addendum)
 [place] EMERGENCY DEPARTMENT AT [facility] Provider Note   CSN: [id number] Arrival date & time: 03/27/23  4098     History  Chief Complaint  Patient presents with   Shortness of Breath   Hypertension   Headache    Sandra [name] is a 58 y.o. female.  This is a 58 year old female presenting emergency department for elevated blood pressure.  She has medication noncompliance as she sometimes forgets to take her medications.  For the past week she has had worsening dyspnea on exertion, can walk roughly 50% of her normal.  Worsening orthopnea.  Minor leg swelling.  Saw primary doctor yesterday who noted blood pressures in the 190s.  She was directed to come the emergency department that time, but wanted to go home instead.  She stated that headache that she had yesterday has almost resolved.  But continues to have elevated blood pressure in the 170s at home.  Denies any chest pain at this time.   Shortness of Breath Associated symptoms: headaches   Hypertension Associated symptoms include headaches and shortness of breath.  Headache      Home Medications Prior to Admission medications   Medication Sig Start Date End Date Taking? Authorizing Provider  amoxicillin (AMOXIL) 500 MG capsule Take 1 capsule (500 mg total) by mouth 3 (three) times daily until gone. 01/02/23     aspirin EC 81 MG tablet Take 1 tablet (81 mg total) by mouth daily. Swallow whole. 02/23/21  Yes Atway, Rayann N, DO  atorvastatin (LIPITOR) 80 MG tablet Take 1 tablet by mouth once daily 05/28/22   Atway, Rayann N, DO  Dulaglutide (TRULICITY) 4.5 MG/0.5ML SOPN Inject 4.5 mg as directed once a week. 11/07/22  Yes Atway, Rayann N, DO  empagliflozin (JARDIANCE) 10 MG TABS tablet Take 1 tablet (10 mg total) by mouth daily before breakfast. 03/26/23   Atway, Rayann N, DO  escitalopram (LEXAPRO) 10 MG tablet Take 1 tablet by mouth once daily 11/19/22  Yes Atway, Rayann N, DO  finasteride (PROSCAR) 5 MG tablet  Take 2.5 mg by mouth daily. 12/05/22   [provider]  fluticasone (FLONASE) 50 MCG/ACT nasal spray Place 1-2 sprays into both nostrils daily as needed for allergies or rhinitis.    [provider]  Insulin Glargine (BASAGLAR KWIKPEN) 100 UNIT/ML Inject 26 Units into the skin at bedtime. 03/26/23   Atway, Rayann N, DO  Insulin Pen Needle (B-D UF III MINI PEN NEEDLES) 31G X 5 MM MISC 1 Stick by Does not apply route daily. 11.69, insulin requirement 05/15/21   Atway, Rayann N, DO  ketotifen (ZADITOR) 0.025 % ophthalmic solution Place 1 drop into both eyes 2 (two) times daily. 02/02/22   Belva Agee, MD  metFORMIN (GLUCOPHAGE) 1000 MG tablet Take 1 tablet (1,000 mg total) by mouth 2 (two) times daily with a meal. 03/26/23   Atway, Rayann N, DO  methocarbamol (ROBAXIN) 500 MG tablet Take 500 mg by mouth every 6 (six) hours as needed for muscle spasms. 07/03/19   [provider]  metoprolol (TOPROL-XL) 200 MG 24 hr tablet Take 1 tablet (200 mg total) by mouth daily. 03/26/23   Atway, Rayann N, DO  pantoprazole (PROTONIX) 40 MG tablet Take 1 tablet by mouth once daily 03/26/23   Atway, Rayann N, DO  sacubitril-valsartan (ENTRESTO) 97-103 MG Take 1 tablet by mouth 2 (two) times daily. 07/11/22   Wendall Stade, MD  spironolactone (ALDACTONE) 25 MG tablet Take 1 tablet (25 mg total)  by mouth daily. 03/26/23   Chauncey Mann, DO      Allergies    Drug class [trazodone and nefazodone] and Sertraline hcl    Review of Systems   Review of Systems  Respiratory:  Positive for shortness of breath.   Neurological:  Positive for headaches.    Physical Exam Updated Vital Signs BP (!) 164/87 (BP Location: Right Arm)   Pulse 84   Temp 98.5 F (36.9 C)   Resp 15   SpO2 99%  Physical Exam Vitals and nursing note reviewed.  Constitutional:      General: She is not in acute distress.    Appearance: She is obese. She is not toxic-appearing.  Cardiovascular:     Rate and Rhythm:  Normal rate and regular rhythm.  Pulmonary:     Effort: Pulmonary effort is normal. No tachypnea.     Breath sounds: No decreased breath sounds.  Musculoskeletal:     Cervical back: Normal range of motion.     Right lower leg: Edema present.     Left lower leg: Edema present.  Skin:    Capillary Refill: Capillary refill takes less than 2 seconds.  Neurological:     Mental Status: She is alert and oriented to person, place, and time.  Psychiatric:        Mood and Affect: Mood normal.        Behavior: Behavior normal.     ED Results / Procedures / Treatments   Labs (all labs ordered are listed, but only abnormal results are displayed) Labs Reviewed  BASIC METABOLIC PANEL - Abnormal; Notable for the following components:      Result Value   Glucose, Bld 201 (*)    All other components within normal limits  CBC WITH DIFFERENTIAL/PLATELET - Abnormal; Notable for the following components:   WBC 3.4 (*)    RBC 3.10 (*)    Hemoglobin 10.5 (*)    HCT 31.5 (*)    MCV 101.6 (*)    All other components within normal limits  BRAIN NATRIURETIC PEPTIDE - Abnormal; Notable for the following components:   B Natriuretic Peptide 147.2 (*)    All other components within normal limits  TROPONIN I (HIGH SENSITIVITY) - Abnormal; Notable for the following components:   Troponin I (High Sensitivity) 62 (*)    All other components within normal limits  TROPONIN I (HIGH SENSITIVITY)    EKG EKG Interpretation Date/Time:  Wednesday March 27 2023 10:25:00 EST Ventricular Rate:  78 PR Interval:  144 QRS Duration:  130 QT Interval:  438 QTC Calculation: 499 R Axis:   112  Text Interpretation: Normal sinus rhythm Right axis deviation Non-specific intra-ventricular conduction block Cannot rule out Anterior infarct , age undetermined T wave abnormality, consider inferior ischemia Abnormal ECG When compared with ECG of 14-Oct-2019 16:31, PREVIOUS ECG IS PRESENT since last tracing no significant  change Confirmed by Rolan Bucco [phone]) on 03/27/2023 10:28:49 AM  Radiology DG Chest 2 View Result Date: 03/27/2023 CLINICAL DATA:  Shortness of breath. EXAM: CHEST - 2 VIEW COMPARISON:  September 14, 2019. FINDINGS: Stable cardiomediastinal silhouette. Both lungs are clear. The visualized skeletal structures are unremarkable. IMPRESSION: No active cardiopulmonary disease. Electronically Signed   By: Lupita Raider M.D.   On: 03/27/2023 12:18    Procedures Procedures    Medications Ordered in ED Medications  furosemide (LASIX) injection 40 mg (has no administration in time range)    ED Course/ Medical Decision Making/ A&P  Clinical Course as of 03/27/23 1248  Wed Mar 27, 2023  1122 Per PCP note yesterday: "Chronic HFrEF (heart failure with reduced ejection fraction) [facility]) Patient presents for a follow up of her HTN and HFrecEF -last EF 50-55% in March 2023.  Patient's blood pressure is elevated to 174/96 and 191/98 upon recheck.  She endorses a headache that started today, as well as dyspnea on exertion x 1 week, but denies vision changes, dizziness, chest pain, palpitations orthopnea PND or lower extremity swelling.  She states that she did not take any of her blood pressure medications this morning, as she forgot.  Her headache feels similar to previous headaches that she has had, although she usually only gets headaches when she is sick.   On exam, she appears tired but is in no acute distress.  Cranial nerves II through XII intact.  She does have a small subconjunctival hemorrhage in her right eye, but she states that this is secondary to the eye injection that she just had a few days ago.  She has no audible crackles on her lung exam and has no lower extremity edema.   Plan: - Advised patient to take her home metoprolol 200 mg daily, Entresto 97-103 mg twice daily, Jardiance 10 mg daily, and spironolactone 25 mg daily as soon as she gets home - Recheck blood pressure in about 1 hour  after taking meds -Strict return precautions given for patient to go to ED -Follow-up in 1 week: If dyspnea on exertion is not improved, consider adding diuretic like lasix " [TY]  1229 DG Chest 2 View IMPRESSION: No active cardiopulmonary disease.   [TY]    Clinical Course User Index [TY] Coral Spikes, DO                                 Medical Decision Making This is a 58 year old female history of CHF, obesity, hypertension presenting emergency department for elevated blood pressure.  164/87 on arrival.  Does not appear to be in overt respiratory distress despite her complaint of shortness of breath/dyspnea on exertion.  Clinically slightly hypervolemic with mild lower extremity edema.  Chest x-ray on my independent interpretation does have some vascular congestion, but not overt pulmonary edema.  Radiologist reading is no acute process.  Her EKG appears similar to prior.  Not having active chest pain.  Has a mild elevation in troponin at 62.  BNP elevated at 147.  She is not on Lasix; given IV dose here.  Cardiology consulted regarding troponin elevation.  Labs otherwise show no metabolic derangements.  Normal kidney function.  Does have leukopenia and anemia.  Leukopenia seemingly downtrending, but somewhat similar to prior.  Anemia stable.  Given patient's worsening symptoms elevated blood pressure and abnormal labs will admit for acute on chronic CHF for further IV diuresis.  Addendum; after discussion with internal medicine, they feel he do not need cardiology input at this time.  Cancel page.  Amount and/or Complexity of Data Reviewed External Data Reviewed:     Details: See ED course Radiology:  Decision-making details documented in ED Course. Discussion of management or test interpretation with external provider(s): Discussed with hospitalist.  Risk Prescription drug management. Decision regarding hospitalization.          Final Clinical Impression(s) / ED  Diagnoses Final diagnoses:  Acute on chronic congestive heart failure, unspecified heart failure type (HCC)    Rx / DC Orders  ED Discharge Orders     None         Coral Spikes, DO 03/27/23 1238    Coral Spikes, DO 03/27/23 1248

## 2023-03-27 NOTE — Progress Notes (Signed)
 Discussed scheduled medications with patient. Patient asked for clarification on xarelto. Patient expresses familiarity with education and states that she has not "taken that in a long time". When asked, patient quantified this length of time as "months". On call physician notified. Medication not given pending further discussion.

## 2023-03-27 NOTE — H&P (Signed)
 Date: 03/27/2023               Patient Name:  Sandra [name] MRN: [id number]  DOB: [birth year] Age / Sex: 58 y.o., female   PCP: Chauncey Mann, DO         Medical Service: Internal Medicine Teaching Service         Attending Physician: Dr. Miguel Aschoff, MD      First Contact: Dr. Katheran James, DO Pager [phone]    Second Contact: Dr. Rudene Christians, DO Pager [phone]         After Hours (After 5p/  First Contact Pager: [phone]  weekends / holidays): Second Contact Pager: [phone]   SUBJECTIVE   Chief Complaint: elevated blood pressure, dyspnea  History of Present Illness:  Sandra [name] is a 58 year old female with PMH of HFrecEF, HTN, type 2 DM, GERD who presents with hypertension and acute dyspnea and admitted for concern of acute heart failure exacerbation.   Sandra [name] was in her normal state of health until this past weekend when she developed dyspnea on exertion and cough productive of white phlegm (about four days ago).  Since then she had significant discomfort noting she had to stop twice when walking into her PCP office yesterday.  At that visit, her blood pressure was severely elevated above 190 systolic and she had a headache, but no hypoxia or signs of heart failure exacerbation.  She was noted to have missed doses of some of her medications, including diabetes and antihypertensives, and was advised to restart them.  She was also advised to consider going to the ER for further evaluation, but patient opted to see how she did at home.  Today she came to the ER due to continued persistent high blood pressure, headache, and continued dyspnea.  She states that she had the flu about 1 month ago and made a full recovery after that illness.  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  She notes that her dyspnea started over the weekend and she did does not believe she missed medicine doses before then, although missed some of her antihypertensive  and diabetes medicines this week.  Presently she feels well.  She feels a bit cold in her exam room, but denies fever or chills.  She is not short of breath at rest but is when she walks.  She typically sleeps on 4 pillows and this is unchanged.  He does not think she has worsening swelling of her legs.  She does not feel that she has weight gain or increased fluid on her body, although she does not weigh herself.  She ascribes to pleuritic chest pain.  She has a cough and makes white phlegm.  She is not a smoker.  ED Course: BNP 147 Troponin 62 WBC 3.4 Hgb 10.5, MCV 101.6  Past Medical History HFrecEF Diabetes HTN HLD  Past Surgical History:  Procedure Laterality Date   CHOLECYSTECTOMY  2009   DILATATION & CURETTAGE/HYSTEROSCOPY WITH MYOSURE N/A 12/25/2017   Procedure: DILATATION & CURETTAGE/HYSTEROSCOPY   POLYPECTOMY WITH MYOSURE;  Surgeon: Vick Frees, MD;  Location: [facility];  Service: Gynecology;  Laterality: N/A;   TUBAL LIGATION      Meds:  Current Facility-Administered Medications for the 03/27/23 encounter [facility] Encounter)  Medication   Sodium Hyaluronate (DUROLANE) intra-articular injection 60 mg   Current Meds  Medication Sig   aspirin EC 81 MG tablet Take 1 tablet (81 mg total) by mouth  daily. Swallow whole.   atorvastatin (LIPITOR) 80 MG tablet Take 1 tablet by mouth once daily (Patient taking differently: Take 80 mg by mouth at bedtime.)   Dulaglutide (TRULICITY) 4.5 MG/0.5ML SOPN Inject 4.5 mg as directed once a week.   empagliflozin (JARDIANCE) 10 MG TABS tablet Take 1 tablet (10 mg total) by mouth daily before breakfast.   escitalopram (LEXAPRO) 10 MG tablet Take 1 tablet by mouth once daily   fluticasone (FLONASE) 50 MCG/ACT nasal spray Place 1-2 sprays into both nostrils daily as needed for allergies or rhinitis.   Insulin Glargine (BASAGLAR KWIKPEN) 100 UNIT/ML Inject 26 Units into the skin at bedtime. (Patient taking differently: Inject 23 Units into the  skin at bedtime.)   metFORMIN (GLUCOPHAGE) 1000 MG tablet Take 1 tablet (1,000 mg total) by mouth 2 (two) times daily with a meal.   metoprolol (TOPROL-XL) 200 MG 24 hr tablet Take 1 tablet (200 mg total) by mouth daily.   pantoprazole (PROTONIX) 40 MG tablet Take 1 tablet by mouth once daily   sacubitril-valsartan (ENTRESTO) 97-103 MG Take 1 tablet by mouth 2 (two) times daily.   spironolactone (ALDACTONE) 25 MG tablet Take 1 tablet (25 mg total) by mouth daily.   Social:  Lives With: Her husband and mother Occupation: Works at childcare facility Support: Great support from many local family and husband in exam room Level of Function: Independent in I/ADLs PCP: Dr. Elza Rafter Substances: No smoking or recreational drugs, social drinker  Family History:  Father died of heart attack Grandmother had unknown type of cancer  Allergies: Allergies as of 03/27/2023 - Review Complete 03/27/2023  Allergen Reaction Noted   Drug class [trazodone and nefazodone]  10/14/2019   Sertraline hcl  10/14/2019   Review of Systems: A complete ROS was negative except as per HPI.   OBJECTIVE:   Physical Exam: Blood pressure (!) 164/87, pulse 84, temperature 98.5 F (36.9 C), resp. rate 15, SpO2 99%.  Constitutional: well-appearing female reclining in bed, in no acute distress HENT: normocephalic atraumatic, mucous membranes moist Eyes: conjunctiva non-erythematous Neck: supple. No JVD. Cardiovascular: regular rate and rhythm, no m/r/g Pulmonary/Chest: normal work of breathing on room air, trace rales in the bases. Abdominal: soft, non-tender, non-distended MSK: normal bulk and tone. There is no leg swelling. Neurological: alert & oriented x 3 Skin: warm and dry  Labs: CBC    Component Value Date/Time   WBC 3.4 (L) 03/27/2023 1019   RBC 3.10 (L) 03/27/2023 1019   HGB 10.5 (L) 03/27/2023 1019   HGB 11.1 08/08/2022 1514   HCT 31.5 (L) 03/27/2023 1019   HCT 33.9 (L) 08/08/2022 1514   PLT  284 03/27/2023 1019   PLT 273 08/08/2022 1514   MCV 101.6 (H) 03/27/2023 1019   MCV 100 (H) 08/08/2022 1514   MCH 33.9 03/27/2023 1019   MCHC 33.3 03/27/2023 1019   RDW 13.4 03/27/2023 1019   RDW 12.1 08/08/2022 1514   LYMPHSABS 0.7 03/27/2023 1019   MONOABS 0.5 03/27/2023 1019   EOSABS 0.0 03/27/2023 1019   EOSABS 0.2 K/UL 11/02/2005 1656   BASOSABS 0.0 03/27/2023 1019     CMP     Component Value Date/Time   NA 139 03/27/2023 1019   NA 139 08/08/2022 1514   K 4.0 03/27/2023 1019   CL 104 03/27/2023 1019   CO2 23 03/27/2023 1019   GLUCOSE 201 (H) 03/27/2023 1019   BUN 10 03/27/2023 1019   BUN 18 08/08/2022 1514   CREATININE 0.75  03/27/2023 1019   CREATININE 0.66 05/07/2014 1605   CALCIUM 9.2 03/27/2023 1019   PROT 6.7 10/17/2021 0845   ALBUMIN 3.7 10/17/2021 0845   AST 24 10/17/2021 0845   ALT 27 10/17/2021 0845   ALKPHOS 79 10/17/2021 0845   BILITOT 0.5 10/17/2021 0845   GFRNONAA >60 03/27/2023 1019   GFRNONAA >89 05/07/2014 1605   GFRAA 99 02/23/2020 1500   GFRAA >89 05/07/2014 1605   Troponin 62 -> 59 BNP 147  Imaging:  CXR FINDINGS: Stable cardiomediastinal silhouette. Both lungs are clear. The visualized skeletal structures are unremarkable. IMPRESSION: No active cardiopulmonary disease.  EKG: sinus rhythm, RAD, J point elevation in lead V2  ASSESSMENT & PLAN:   Assessment & Plan by Problem: Principal Problem:   Acute exacerbation of CHF (congestive heart failure) (HCC)   Jun A Kaner is a 58 y.o. person living with a history of HFrecEF, HTN, type 2 DM, GERD who presents with hypertension and acute dyspnea and admitted for concern of acute heart failure exacerbation.  Today is hospital day 0.  Dyspnea ?Acute Exacerbation of HFrecEF This patient has progressive dyspnea on exertion but importantly is not hypoxic.    She reports a 5-day history of severe dyspnea on exertion.  She notes that she had to stop twice yesterday when walking into her  primary doctor.  These are in conjunction with a cough productive of white sputum.  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  She did have the flu a month ago, but made a full recovery.     Regarding the heart, she has a history of CHF with trace rales on her exam and very mildly elevated BNP, albeit obesity might hide elevation, but otherwise does not appear to be in an acute exacerbation of heart failure.  No murmurs or progressing troponins. Regarding the lungs, she is not a smoker and her lung imaging is nonrevealing.  She does not appear to have pneumonia.  Doubt PE given history. She is not severely anemic. Possible that she merely has a virus and is deconditioned, although does not appear chronically ill. Possible subacute heart failure. - IV diuresis given in ED. She does not take lasix at home. She does not know her dry weight. - Resume home spironolactone 25 every day, entresto 97-103 BID, metoprolol succinate 200 every day - Ambulatory pulse ox - Respiratory viral panel pending - PT, OT eval  Hypertension Patient's primary concern for ER presentation today was her ongoing high blood pressure.  Yesterday in her clinic visit was noted to be over 190 systolic and considered an ER visit at that time.  Pressures improved to 160s this a.m. having resumed her Entresto and other medicines.  Her blood pressure remains stable at this point, she does not need additional therapy beyond resumption of home meds.  She did have a headache but that has since resolved.  She does not have vision changes, neurologic symptoms, evidence of endorgan damage on her lab work.  Likely elevated due to her missed doses over this week, possibly missed due to her malaise and dyspnea. - Resume home meds per above and monitor  Chronic Stable Issues  GERD Home protonix  Depression Home lexapro 10 every day  T2DM A1c 8.1%. Home metformin and basaglar 23u qhs. SSI.  HLD Home  atorvastatin  Diet: Carb-Modified VTE: DOAC IVF: None,None Code: Full  Prior to Admission Living Arrangement: Home, living with husband and mother Anticipated Discharge Location: Home Barriers to Discharge:  Medical workup  Dispo: Admit patient to Observation with expected length of stay less than 2 midnights.  Signed: Katheran James, DO Internal Medicine Resident PGY-1 Pager: [phone]. After 5pm and on weekends page [phone] 03/27/2023, 3:39 PM

## 2023-03-27 NOTE — Telephone Encounter (Signed)
 Copied from CRM [id number]. Topic: Clinical - Red Word Triage >> Mar 27, 2023  9:07 AM Carrielelia G wrote: Red Word that prompted transfer to Nurse Triage: BP 167/108, head pain, diarrhea.  Chief Complaint: HTN Symptoms: 167/108, headache, nausea, diarrhea, SOB w/exertion Frequency: yesterday and today Pertinent Negatives: Patient denies chest pain  Disposition: [x] ED /[] Urgent Care (no appt availability in office) / [] Appointment(In office/virtual)/ []  [facility]/ [] Home Care/ [] Refused Recommended Disposition /[] [facility]/ []  Follow-up with PCP Additional Notes: pt had HTN yesterday in office 191/95: was instructed to go home and take BP med then recheck in one hour or go to ER to receive fluids: pt stated BP went down to 141 systolic.  Today pt woke up BP 167/108 took medication and then called nurse triage about 30 minutes later due to not feeling well. New readings include:  BP@0915  169/105, @ 0918 164/100  Reason for Disposition  [1] Systolic BP  >= 160 OR Diastolic >= 100 AND [2] cardiac (e.g., breathing difficulty, chest pain) or neurologic symptoms (e.g., new-onset blurred or double vision, unsteady gait)    High BP readings x 2 days with symptoms of high BP - recommended go to now.  Additional Information  Commented on: [1] Systolic BP  >= 180 OR Diastolic >= 110 AND [2] missed most recent dose of blood pressure medication    High BP readings x 2 days with symptoms of high BP - recommended go to now.  Answer Assessment - Initial Assessment Questions 1. BLOOD PRESSURE: "What is the blood pressure?" "Did you take at least two measurements 5 minutes apart?"     191/95 yesterday in office either go home take med or go to ER 2. ONSET: "When did you take your blood pressure?"     yesterday 3. HOW: "How did you take your blood pressure?" (e.g., automatic home BP monitor, visiting nurse)     automatic 4. HISTORY: "Do you have a history of high blood pressure?"      History of HTN 5. MEDICINES: "Are you taking any medicines for blood pressure?" "Have you missed any doses recently?"     Took BP about 30 minutes ago 6. OTHER SYMPTOMS: "Do you have any symptoms?" (e.g., blurred vision, chest pain, difficulty breathing, headache, weakness)     Headache 2/10, nausea, diarrhea, SOB w/exertion, tired/fatigue 7. PREGNANCY: "Is there any chance you are pregnant?" "When was your last menstrual period?"     N/a  Protocols used: Blood Pressure - High-A-AH

## 2023-03-27 NOTE — ED Triage Notes (Addendum)
 Pt. Stated, I'm here for high Bp and SOB striated yesterday. My Dr. York Spaniel to come here this morning

## 2023-03-28 ENCOUNTER — Other Ambulatory Visit (HOSPITAL_COMMUNITY): Payer: Self-pay

## 2023-03-28 DIAGNOSIS — I110 Hypertensive heart disease with heart failure: Secondary | ICD-10-CM | POA: Diagnosis not present

## 2023-03-28 DIAGNOSIS — I5020 Unspecified systolic (congestive) heart failure: Secondary | ICD-10-CM | POA: Diagnosis not present

## 2023-03-28 LAB — FERRITIN: Ferritin: 54 ng/mL (ref 11–307)

## 2023-03-28 LAB — GLUCOSE, CAPILLARY
Glucose-Capillary: 215 mg/dL — ABNORMAL HIGH (ref 70–99)
Glucose-Capillary: 181 mg/dL — ABNORMAL HIGH (ref 70–99)

## 2023-03-28 LAB — BASIC METABOLIC PANEL
Anion gap: 7 (ref 5–15)
BUN: 15 mg/dL (ref 6–20)
CO2: 25 mmol/L (ref 22–32)
Calcium: 8.7 mg/dL — ABNORMAL LOW (ref 8.9–10.3)
Chloride: 106 mmol/L (ref 98–111)
Creatinine, Ser: 0.77 mg/dL (ref 0.44–1.00)
GFR, Estimated: >60 mL/min (ref >60)
Glucose, Bld: 235 mg/dL — ABNORMAL HIGH (ref 70–99)
Potassium: 3.4 mmol/L — ABNORMAL LOW (ref 3.5–5.1)
Sodium: 138 mmol/L (ref 135–145)

## 2023-03-28 LAB — CBC
HCT: 31.6 % — ABNORMAL LOW (ref 36.0–46.0)
Hemoglobin: 10.4 g/dL — ABNORMAL LOW (ref 12.0–15.0)
MCH: 33 pg (ref 26.0–34.0)
MCHC: 32.9 g/dL (ref 30.0–36.0)
MCV: 100.3 fL — ABNORMAL HIGH (ref 80.0–100.0)
Platelets: 280 10*3/uL (ref 150–400)
RBC: 3.15 MIL/uL — ABNORMAL LOW (ref 3.87–5.11)
RDW: 13.1 % (ref 11.5–15.5)
WBC: 3.5 10*3/uL — ABNORMAL LOW (ref 4.0–10.5)
nRBC: 0 % (ref 0.0–0.2)

## 2023-03-28 LAB — IRON AND TIBC
Iron: 29 ug/dL (ref 28–170)
Saturation Ratios: 11 % (ref 10.4–31.8)
TIBC: 267 ug/dL (ref 250–450)
UIBC: 238 ug/dL

## 2023-03-28 LAB — FOLATE: Folate: 16.7 ng/mL (ref >5.9)

## 2023-03-28 LAB — MAGNESIUM: Magnesium: 1.5 mg/dL — ABNORMAL LOW (ref 1.7–2.4)

## 2023-03-28 LAB — HIV ANTIBODY (ROUTINE TESTING W REFLEX): HIV Screen 4th Generation wRfx: NONREACTIVE

## 2023-03-28 LAB — VITAMIN B12: Vitamin B-12: 333 pg/mL (ref 180–914)

## 2023-03-28 LAB — TSH: TSH: 1.142 u[IU]/mL (ref 0.350–4.500)

## 2023-03-28 MED ORDER — INSULIN GLARGINE 100 UNIT/ML ~~LOC~~ SOLN
23.0000 [IU] | Freq: Every day | SUBCUTANEOUS | Status: DC
  Filled 2023-03-28: qty 0.23

## 2023-03-28 MED ORDER — MAGNESIUM SULFATE 4 GM/100ML IV SOLN
4.0000 g | Freq: Once | INTRAVENOUS | Status: AC
  Administered 2023-03-28: 4 g via INTRAVENOUS
  Filled 2023-03-28: qty 100

## 2023-03-28 MED ORDER — POTASSIUM CHLORIDE CRYS ER 20 MEQ PO TBCR
40.0000 meq | EXTENDED_RELEASE_TABLET | Freq: Two times a day (BID) | ORAL | Status: AC
  Administered 2023-03-28 (×2): 40 meq via ORAL
  Filled 2023-03-28 (×2): qty 2

## 2023-03-28 MED ORDER — POTASSIUM CHLORIDE CRYS ER 20 MEQ PO TBCR
40.0000 meq | EXTENDED_RELEASE_TABLET | Freq: Two times a day (BID) | ORAL | 0 refills | Status: DC
  Filled 2023-03-28: qty 4, 1d supply, fill #0

## 2023-03-28 NOTE — TOC Transition Note (Addendum)
 Transition of Care [facility]) - Discharge Note   Patient Details  Name: Sandra [name] MRN: [id number] Date of Birth: [birth year]  [facility]) CM/SW Contact:  Leone Haven, RN Phone Number: 03/28/2023, 1:34 PM   Clinical Narrative:    For dc today, spouse is at the bedside to transport her home. Has no needs.  TOC Pharmacy to fill meds.         Patient Goals and CMS Choice            Discharge Placement                       Discharge Plan and Services Additional resources added to the After Visit Summary for                                       Social Drivers of Health (SDOH) Interventions SDOH Screenings   Food Insecurity: No Food Insecurity (03/27/2023)  Housing: Low Risk  (03/27/2023)  Transportation Needs: No Transportation Needs (03/27/2023)  Utilities: Not At Risk (03/27/2023)  Depression (PHQ2-9): Low Risk  (03/04/2023)  Financial Resource Strain: Low Risk  (01/31/2022)  Social Connections: Moderately Integrated (03/27/2023)  Stress: No Stress Concern Present (01/31/2022)  Tobacco Use: Low Risk  (03/27/2023)     Readmission Risk Interventions     No data to display

## 2023-03-28 NOTE — Discharge Instructions (Addendum)
 Ms Sandra, [name] were hospitalized for severe shortness of breath. You did not have low oxygen, which is called "hypoxia," which is a very good thing. Most likely, you had some extra fluid on your body due to your heart failure, which we removed with water pills. Please be sure to attend your appointment in the [facility]. Please be sure to take your medicines daily. We can discuss that further in the clinic.  I will send you with a couple potassium pills to take at home as your levels are slightly low. Take one this evening and one in the morning.  Of course, call the clinic sooner if you need [name].  Sudden and severe shortness of breath that persists despite rest would be a reason to call the clinic or return to the ED. Remember, some shortness of breath during activity is expected as your recover.  - Dr. Ninfa Meeker

## 2023-03-28 NOTE — Progress Notes (Signed)
 SATURATION QUALIFICATIONS: (This note is used to comply with regulatory documentation for home oxygen)  Patient Saturations on Room Air at Rest = 97 %  Patient Saturations on Room Air while Ambulating = 96%  Patient Saturations on 0 Liters of oxygen while Ambulating = 96%  HR 115

## 2023-03-28 NOTE — TOC CM/SW Note (Signed)
 Transition of Care [facility]) - Inpatient Brief Assessment   Patient Details  Name: Sandra [name] MRN: [id number] Date of Birth: [birth year]  Transition [facility]) CM/SW Contact:    Leone Haven, RN Phone Number: 03/28/2023, 1:33 PM   Clinical Narrative: From home with spouse, has PCP and insurance on file, states has no HH services in place at this time or DME at home.  States family member will transport them home at [employer] and family is support system, states gets medications from [facility] on [address].  Pta self ambulatory .   Transition of Care Asessment: Insurance and Status: Insurance coverage has been reviewed Patient has primary care physician: Yes Home environment has been reviewed: home with spouse Prior level of function:: indep Prior/Current Home Services: No current home services Social Drivers of Health Review: SDOH reviewed no interventions necessary Readmission risk has been reviewed: Yes Transition of care needs: no transition of care needs at this time

## 2023-03-28 NOTE — Inpatient Diabetes Management (Signed)
 Inpatient Diabetes Program Recommendations  AACE/ADA: New Consensus Statement on Inpatient Glycemic Control (2015)  Target Ranges:  Prepandial:   less than 140 mg/dL      Peak postprandial:   less than 180 mg/dL (1-2 hours)      Critically ill patients:  140 - 180 mg/dL   Lab Results  Component Value Date   GLUCAP 181 (H) 03/28/2023   HGBA1C 8.1 (A) 03/26/2023    Review of Glycemic Control  Diabetes history: type 2 Outpatient Diabetes medications: Trulicity 4.5 mg weekly, Basaglar 23 units daily, Jardiance 10 mg daily, Metformin 1000 mg BID Current orders for Inpatient glycemic control: Lantus 23 units at HS, Novolog 0-15 units correction scale TID, Jardiance 10 mg daily, Metformin 1000 mg BID  Inpatient Diabetes Program Recommendations:   Spoke with patient at the bedside. Patient was able to call [facility] pharmacy to make sure they had the Basaglar, Trulicity, and Metformin in stock. States that she will be picking them up after discharge. States that she has an appointment with PCP next week. Would like to get her Hgba1C of 8.1% back down to 7% where it has been in the past. Patient had no questions at this time. Ready to go home.   Smith Mince RN BSN CDE Diabetes Coordinator Pager: [phone]  8am-5pm

## 2023-03-28 NOTE — Evaluation (Signed)
 Occupational Therapy Evaluation Patient Details Name: Sandra [name] MRN: [id number] DOB: [birth year] Today's Date: 03/28/2023   History of Present Illness   Sandra [name] is a 58 y.o. F admitted 03/27/23 for concern of acute heart failure exacerbation. Pt developed DOE and productive cough of white phlegm over the past 4 days. PMH of HFrecEF, HTN, T2DM, GERD.     Clinical Impressions At baseline, pt is Independent with ADLs, IADLs, functional mobility without an AD, and works and drives. Pt now presents with decreased activity tolerance and increased dyspnea with ambulation and leaning forward for LB ADLs. Pt currently demonstrates ability to complete all ADLs Independent to Mod I (increased time and effort) and functional mobility without an AD with Mod I (increased time and effort). Pt's VSS on RA throughout session. Pt participated well in session and is motivated to return to baseline PLOF. Pt will benefit from acute skilled OT services to improve overall management of CHF, address dyspnea with activity, and increase pt comfort and safety with functional tasks. OT education to including training in energy conservation strategies, strategies for improved management of CHF, and use of AE for LB ADLs. No post-acute skilled OT needs are anticipated at this time.      If plan is discharge home, recommend the following:   Assistance with cooking/housework;Assist for transportation;Help with stairs or ramp for entrance (PRN assist)     Functional Status Assessment   Patient has had a recent decline in their functional status and demonstrates the ability to make significant improvements in function in a reasonable and predictable amount of time.     Equipment Recommendations   Other (comment) (Pt may benefit from use of AE for LB ADLs, including long handled sponge/brush, long handled shoe horn, and elastic shoe laces)     Recommendations for Other Services          Precautions/Restrictions   Precautions Precautions: Fall Recall of Precautions/Restrictions: Intact Restrictions Weight Bearing Restrictions Per Provider Order: No     Mobility Bed Mobility Overal bed mobility: Independent                  Transfers Overall transfer level: Independent                        Balance Overall balance assessment: Mild deficits observed, not formally tested (No overt LOB during funcitonal transfers and ambulation without an AD)                                         ADL either performed or assessed with clinical judgement   ADL Overall ADL's : Independent;Modified independent                                       General ADL Comments: Pt demonstrates ability to complete all ADLs Independent to Mod I (increased time and effort). However, pt presents with decreased activity tolerance and increased SOB when leaning forward to perform LB ADLs. Pt may benefit from use of AE for LB ADLs.     Vision Baseline Vision/History: 1 Wears glasses Ability to See in Adequate Light: 0 Adequate (with glasses) Patient Visual Report: No change from baseline Vision Assessment?: No apparent visual deficits     Perception  Praxis         Pertinent Vitals/Pain Pain Assessment Pain Assessment: 0-10 Pain Score: 4  Pain Location: Head Pain Descriptors / Indicators: Headache Pain Intervention(s): Monitored during session, Patient requesting pain meds-RN notified     Extremity/Trunk Assessment Upper Extremity Assessment Upper Extremity Assessment: Right hand dominant;Overall [facility] for tasks assessed   Lower Extremity Assessment Lower Extremity Assessment: Defer to PT evaluation   Cervical / Trunk Assessment Cervical / Trunk Assessment: Normal   Communication Communication Communication: No apparent difficulties   Cognition Arousal: Alert Behavior During Therapy: WFL for tasks  assessed/performed Cognition: No apparent impairments             OT - Cognition Comments: AAOx4 and pleasant throughout session. Pt demonstrates good safety awareness and good insight into medical condition.                 Following commands: Intact       Cueing  General Comments      VSS on RA throughout session. Pt's husband and mother present throughout session.   Exercises     Shoulder Instructions      Home Living Family/patient expects to be discharged to:: Private residence Living Arrangements: Spouse/significant other;Parent (husband and mother) Available Help at Discharge: Family;Available PRN/intermittently Type of Home: House Home Access: Stairs to enter [employer] of Steps: 3 Entrance Stairs-Rails: Right;Left;Can reach both Home Layout: One level     Bathroom Shower/Tub: Chief Strategy Officer: Standard     Home Equipment: None          Prior Functioning/Environment Prior Level of Function : Independent/Modified Independent;Working/employed;Driving             Mobility Comments: Independent without an AD; reports no falls ADLs Comments: Independent with ADLs and IADLs; works full-time in childcare; drives    OT Problem List: Cardiopulmonary status limiting activity;Decreased activity tolerance   OT Treatment/Interventions: Self-care/ADL training;Energy conservation;DME and/or AE instruction;Patient/family education      OT Goals(Current goals can be found in the care plan section)   Acute Rehab OT Goals Patient Stated Goal: to return home and bathe in her own tub OT Goal Formulation: With patient Time For Goal Achievement: 04/11/23 Potential to Achieve Goals: Good ADL Goals Pt Will Perform Lower Body Bathing: with modified independence;with adaptive equipment;sitting/lateral leans;sit to/from stand Pt Will Perform Lower Body Dressing: with modified independence;with adaptive equipment;sitting/lateral  leans;sit to/from stand Additional ADL Goal #1: Patient will demonstrate ability to Independently state 4 energy conservation strategies to improve management of chronic health conditions and increase safety and comfort with functional tasks. Additional ADL Goal #2: Patient will demonstrate understanding of education in strategies for improved management of CHF through teach back with handout provided.   OT Frequency:  Min 1X/week    Co-evaluation              AM-PAC OT "6 Clicks" Daily Activity     Outcome Measure Help from another person eating meals?: None Help from another person taking care of personal grooming?: None Help from another person toileting, which includes using toliet, bedpan, or urinal?: None Help from another person bathing (including washing, rinsing, drying)?: None Help from another person to put on and taking off regular upper body clothing?: None Help from another person to put on and taking off regular lower body clothing?: None 6 Click Score: 24   End of Session Nurse Communication: Mobility status;Patient requests pain meds  Activity Tolerance: Patient tolerated treatment well Patient  left: in bed;with call bell/phone within reach;with family/visitor present  OT Visit Diagnosis: Other (comment) (decreased activity tolerance)                Time: 6578-4696 OT Time Calculation (min): 15 min Charges:  OT General Charges $OT Visit: 1 Visit OT Evaluation $OT Eval Low Complexity: 1 Low  Vikrant Pryce "Orson Eva., OTR/L, MA Acute Rehab [phone]  Lendon Colonel 03/28/2023, 10:17 AM

## 2023-03-28 NOTE — Discharge Summary (Signed)
 Name: JAVONNE LOUISSAINT MRN: [id number] DOB: April 15, 1965 58 y.o. PCP: Chauncey Mann, DO  Date of Admission: 03/27/2023 10:03 AM Date of Discharge:  03/28/23 Attending Physician: Dr. Mayford Knife  DISCHARGE DIAGNOSIS:  Primary Problem: Acute exacerbation of CHF (congestive heart failure) [facility])   Hospital Problems: Principal Problem:   Acute exacerbation of CHF (congestive heart failure) (HCC)    DISCHARGE MEDICATIONS:   Allergies as of 03/28/2023       Reactions   Drug Class [trazodone And Nefazodone]    Developed suicidal ideation within 3 days of starting   Sertraline Hcl    Developed suicidal ideation within 3d of starting        Medication List     TAKE these medications    aspirin EC 81 MG tablet Take 1 tablet (81 mg total) by mouth daily. Swallow whole.   atorvastatin 80 MG tablet Commonly known as: LIPITOR Take 1 tablet by mouth once daily What changed: when to take this   Basaglar KwikPen 100 UNIT/ML Inject 26 Units into the skin at bedtime. What changed: how much to take   empagliflozin 10 MG Tabs tablet Commonly known as: JARDIANCE Take 1 tablet (10 mg total) by mouth daily before breakfast.   Entresto 97-103 MG Generic drug: sacubitril-valsartan Take 1 tablet by mouth 2 (two) times daily.   escitalopram 10 MG tablet Commonly known as: LEXAPRO Take 1 tablet by mouth once daily   fluticasone 50 MCG/ACT nasal spray Commonly known as: FLONASE Place 1-2 sprays into both nostrils daily as needed for allergies or rhinitis.   metFORMIN 1000 MG tablet Commonly known as: GLUCOPHAGE Take 1 tablet (1,000 mg total) by mouth 2 (two) times daily with a meal.   metoprolol 200 MG 24 hr tablet Commonly known as: TOPROL-XL Take 1 tablet (200 mg total) by mouth daily.   pantoprazole 40 MG tablet Commonly known as: PROTONIX Take 1 tablet by mouth once daily   potassium chloride SA 20 MEQ tablet Commonly known as: KLOR-CON M Take 2 tablets (40 mEq total) by  mouth 2 (two) times daily.   spironolactone 25 MG tablet Commonly known as: ALDACTONE Take 1 tablet (25 mg total) by mouth daily.   Trulicity 4.5 MG/0.5ML Soaj Generic drug: Dulaglutide Inject 4.5 mg as directed once a week.        DISPOSITION AND FOLLOW-UP:  Ms.Janelli A Mounsey was discharged from [facility] in stable condition. At the hospital follow up visit please address:  Follow-up Recommendations: Labs: Basic Metabolic Profile as indicated Studies: Echo if indicated Medications: Ensure adherence to all her GDMT and diabetes medicines  Discharged with short course of potassium supplementation.  Follow-up Appointments: 3/11 with Dr. Ninfa Meeker, [place]  This is a patient hospitalized for severe exertional dyspnea as well as hypertension 170 with headache. The day before hospitalization she was 190 systolic after missing medications. Her treatment was gentle diuresis. She was not hypoxic.  At follow up, assess for recovery of ambulatory function. Assess breathing. Assess for signs of heart failure. Ensure medication adherence. Consider heart assessment with echo. See if she see's a cardiologist. Assess for risk of lung disease. Follow up anemia labs.  HOSPITAL COURSE:  Patient Summary: Dyspnea Concern for Acute Exacerbation of HFrecEF This patient had progressive dyspnea on exertion but importantly was not hypoxic.    She reported a 5-day history of severe dyspnea on exertion.  She notes that she had to stop twice yesterday when walking into her primary doctor.  These are in conjunction with a cough productive of white sputum.  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  She did have the flu a month ago, but made a full recovery.     Regarding the heart, she has a history of CHF with trace rales on her initial exam and very mildly elevated BNP, albeit obesity might hide elevation, but otherwise did not appear to be in an acute exacerbation of  heart failure.  No murmurs or progressing troponins. Regarding the lungs, she is not a smoker and her lung imaging is nonrevealing.  She did not appear to have pneumonia.  Doubt PE given history. She was not severely anemic.    She was treated with light IV diuresis. Her weights were unclear, but a possible 8 pound weight gain over the last month, albeit without evidence on her exam. She was without hypoxia through her first night and during ambulation and had improvement in her dyspnea.  As a result, she was discharged with close follow up arranged. Possible cause of her dyspnea is subacute fluid overload from heart failure or a virus, although her RVP was negative. Missing her home medications might have contributed. - Resumed home spironolactone 25 every day, entresto 97-103 BID, metoprolol succinate 200 every day - Ambulatory pulse ox high 90s on ra - Respiratory viral panel negative - PT, OT eval benign   Hypertension Patient's primary concern for ER presentation was her ongoing high blood pressure.  On day PTA during her clinic visit was noted to be over 190 systolic and considered an ER visit at that time.  Pressures improved to 160s on admission day having resumed her Entresto and other medicines.  Her blood pressure remained stable at this point, she ded not need additional therapy beyond resumption of home meds.  She did have a headache but that has resolved.  She did not have vision changes, neurologic symptoms, evidence of endorgan damage on her lab work.  Likely elevated due to her missed doses over this week, possibly missed due to her malaise and dyspnea. - Resume home meds per above and monitor   Chronic Stable Issues   GERD Home protonix   Depression Home lexapro 10 every day   T2DM A1c 8.1%. Home metformin and basaglar 23u qhs. SSI.   HLD Home atorvastatin   DISCHARGE INSTRUCTIONS:   Discharge Instructions     Call MD for:  difficulty breathing, headache or visual  disturbances   Complete by: As directed    Call MD for:  persistant dizziness or light-headedness   Complete by: As directed    Diet - low sodium heart healthy   Complete by: As directed    Discharge instructions   Complete by: As directed    Ms Anthonella, Klausner were hospitalized for severe shortness of breath. You did not have low oxygen, which is called "hypoxia," which is a very good thing. Most likely, you had some extra fluid on your body due to your heart failure, which we removed with water pills. Please be sure to attend your appointment in the [facility]. Please be sure to take your medicines daily. We can discuss that further in the clinic.   I will send you with a couple potassium pills to take at home as your levels are slightly low. Take one this evening and one in the morning.   Of course, call the clinic sooner if you need [name].   Sudden and severe shortness of  breath that persists despite rest would be a reason to call the clinic or return to the ED. Remember, some shortness of breath during activity is expected as your recover.   - Dr. Ninfa Meeker   Increase activity slowly   Complete by: As directed        SUBJECTIVE:  She feels very well this morning. She still has some dyspnea on exertion but was thrilled to see that her oxygen levels are good at rest and walking. She agrees that going home today would work. She intends to follow up in the [facility] clinic next week.  Discharge Vitals:   BP (!) 147/76 (BP Location: Left Arm)   Pulse 77   Temp 98.8 F (37.1 C) (Oral)   Resp 14   Ht 5' (1.524 m)   Wt 189 lb 9.6 oz (86 kg)   SpO2 100%   BMI 37.03 kg/m   OBJECTIVE:  Physical Exam Constitutional:      Appearance: She is obese. She is not ill-appearing.  Neck:     Vascular: No JVD.  Cardiovascular:     Rate and Rhythm: Normal rate and regular rhythm.     Heart sounds: Normal heart sounds.     Comments: Physiologic S2 splitting Pulmonary:     Effort: Pulmonary effort is  normal.     Breath sounds: Normal breath sounds. No decreased breath sounds, wheezing, rhonchi or rales.  Musculoskeletal:     Right lower leg: No tenderness. No edema.     Left lower leg: No tenderness. No edema.  Skin:    General: Skin is warm and dry.  Neurological:     General: No focal deficit present.     Mental Status: She is alert and oriented to person, place, and time.  Psychiatric:        Mood and Affect: Mood normal.        Behavior: Behavior normal.      Pertinent Labs, Studies, and Procedures:     Latest Ref Rng & Units 03/28/2023    2:41 AM 03/27/2023   10:19 AM 08/08/2022    3:14 PM  CBC  WBC 4.0 - 10.5 K/uL 3.5  3.4  3.9   Hemoglobin 12.0 - 15.0 g/dL 16.1  09.6  04.5   Hematocrit 36.0 - 46.0 % 31.6  31.5  33.9   Platelets 150 - 400 K/uL 280  284  273        Latest Ref Rng & Units 03/28/2023    2:41 AM 03/27/2023   10:19 AM 08/08/2022    3:14 PM  CMP  Glucose 70 - 99 mg/dL 409  811  914   BUN 6 - 20 mg/dL 15  10  18    Creatinine 0.44 - 1.00 mg/dL 7.82  9.56  2.13   Sodium 135 - 145 mmol/L 138  139  139   Potassium 3.5 - 5.1 mmol/L 3.4  4.0  4.2   Chloride 98 - 111 mmol/L 106  104  101   CO2 22 - 32 mmol/L 25  23  25    Calcium 8.9 - 10.3 mg/dL 8.7  9.2  9.5     DG Chest 2 View Result Date: 03/27/2023 CLINICAL DATA:  Shortness of breath. EXAM: CHEST - 2 VIEW COMPARISON:  September 14, 2019. FINDINGS: Stable cardiomediastinal silhouette. Both lungs are clear. The visualized skeletal structures are unremarkable. IMPRESSION: No active cardiopulmonary disease. Electronically Signed   By: Lupita Raider M.D.   On: 03/27/2023 12:18  Signed: Katheran James, DO Internal Medicine Resident, PGY-1 [facility]  Pager: [phone] 1:00 PM, 03/28/2023

## 2023-03-28 NOTE — Hospital Course (Addendum)
 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    She reported a 5-day history of severe dyspnea on exertion.  She notes that she had to stop twice yesterday when walking into her primary doctor.  These are in conjunction with a cough productive of white sputum.  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  She did have the flu a month ago, but made a full recovery.     Regarding the heart, she has a history of CHF with trace rales on her initial exam and very mildly elevated BNP, albeit obesity might hide elevation, but otherwise did not appear to be in an acute exacerbation of heart failure.  No murmurs or progressing troponins. Regarding the lungs, she is not a smoker and her lung imaging is nonrevealing.  She did not appear to have pneumonia.  Doubt PE given history. She was not severely anemic.    She was treated with light IV diuresis. Her weights were unclear, but a possible 8 pound weight gain over the last month, albeit without evidence on her exam. She was without hypoxia through her first night and during ambulation and had improvement in her dyspnea.  As a result, she was discharged with close follow up arranged. Possible cause of her dyspnea is subacute fluid overload from heart failure or a virus, although her RVP was negative. Missing her home medications might have contributed. - Resumed home spironolactone 25 every day, entresto 97-103 BID, metoprolol succinate 200 every day - Ambulatory pulse ox high 90s on ra - Respiratory viral panel negative - PT, OT eval benign   Hypertension Patient's primary concern for ER presentation was her ongoing high blood pressure.  On day PTA during her clinic visit was noted to be over 190 systolic and considered an ER visit at that time.  Pressures improved to 160s on admission day having resumed her Entresto and other medicines.  Her blood  pressure remained stable at this point, she ded not need additional therapy beyond resumption of home meds.  She did have a headache but that has resolved.  She did not have vision changes, neurologic symptoms, evidence of endorgan damage on her lab work.  Likely elevated due to her missed doses over this week, possibly missed due to her malaise and dyspnea. - Resume home meds per above and monitor   Chronic Stable Issues   GERD Home protonix   Depression Home lexapro 10 every day   T2DM A1c 8.1%. Home metformin and basaglar 23u qhs. SSI.   HLD Home atorvastatin

## 2023-03-28 NOTE — Plan of Care (Signed)

## 2023-03-28 NOTE — Progress Notes (Signed)
 Ok to resume Lantus 23 units qday per Dr. Ninfa Meeker.  Ulyses Southward, PharmD, BCIDP, AAHIVP, CPP Infectious Disease Pharmacist 03/28/2023 8:58 AM

## 2023-03-28 NOTE — Evaluation (Signed)
 Physical Therapy Evaluation Patient Details Name: Sandra [name] MRN: [id number] DOB: 15-Jan-1966 Today's Date: 03/28/2023  History of Present Illness  Sandra [name] is a 58 y.o. F admitted 03/27/23 for concern of acute heart failure exacerbation. Pt developed DOE and productive cough of white phlegm over the past 4 days. PMH of HFrecEF, HTN, T2DM, GERD.  Clinical Impression  Pt admitted with above diagnosis. PTA, pt was independent with functional mobility without an AD, independent with all ADLs/IADLs, driving, and working full-time in childcare. She resides in one level house with 3 STE BHR with her husband and mom. Pt currently with functional limitations due to the deficits listed below (see PT Problem List). Pt performed bed mobility and transfers independently and OOB without an AD ModI d/t increased time and effort. She has decreased activity tolerance and DOE 2/4 with activity. Her 5xSTS of 22 seconds is significantly below her age match norm of 7.7 +/- 2.6 seconds. Pt will benefit from acute skilled PT to increase her independence and safety with mobility to allow discharge. Initiated discussions on energy conservation technique and HEP for BLE strength and balance. Anticipate no post-acute skilled PT needs at this time. Will follow acutely.     If plan is discharge home, recommend the following: A little help with walking and/or transfers;Help with stairs or ramp for entrance   Can travel by private vehicle        Equipment Recommendations None recommended by PT  Recommendations for Other Services       Functional Status Assessment Patient has had a recent decline in their functional status and demonstrates the ability to make significant improvements in function in a reasonable and predictable amount of time.     Precautions / Restrictions Precautions Precautions: Fall Recall of Precautions/Restrictions: Intact Restrictions Weight Bearing Restrictions Per Provider Order: No       Mobility  Bed Mobility Overal bed mobility: Independent                  Transfers Overall transfer level: Independent                 General transfer comment: STS from lowest bed height. 5xSTS 22 seconds.    Ambulation/Gait Ambulation/Gait assistance: Modified independent (Device/Increase time) Gait Distance (Feet): 250 Feet Assistive device: None Gait Pattern/deviations: Step-through pattern, Decreased stride length Gait velocity: reduced Gait velocity interpretation: <1.8 ft/sec, indicate of risk for recurrent falls   General Gait Details: Pt ambulated with a reciprocal gait pattern, equal weight shift, upright posture, feet shoulder width apart. Her gait speed was significantly reduced from baseline. She would intermittently pause to catch her breath experiencing DOE 2/4.  Stairs Stairs: Yes Stairs assistance: Modified independent (Device/Increase time) Stair Management: One rail Right, Alternating pattern, Forwards Number of Stairs: 3 General stair comments: Pt ascended/descend 3 steps with a reciprocal gait pattern and unilateral UE support. She was steady with turning on the third step  Wheelchair Mobility     Tilt Bed    Modified Rankin (Stroke Patients Only)       Balance Overall balance assessment: Mild deficits observed, not formally tested (No overt LOB noted)                                           Pertinent Vitals/Pain Pain Assessment Pain Assessment: 0-10 Pain Score: 3  Pain Location: Head Pain Descriptors / Indicators:  Headache Pain Intervention(s): Monitored during session    Home Living Family/patient expects to be discharged to:: Private residence Living Arrangements: Spouse/significant other;Parent (husband and mother) Available Help at Discharge: Family;Available PRN/intermittently Type of Home: House Home Access: Stairs to enter Entrance Stairs-Rails: Right;Left;Can reach both Entrance Stairs-Number  of Steps: 3   Home Layout: One level Home Equipment: None      Prior Function Prior Level of Function : Independent/Modified Independent;Working/employed;Driving             Mobility Comments: Independent without an AD; reports no falls ADLs Comments: Independent with ADLs and IADLs; works full-time in childcare; drives     Extremity/Trunk Assessment   Upper Extremity Assessment Upper Extremity Assessment: Right hand dominant;Overall [facility] for tasks assessed    Lower Extremity Assessment Lower Extremity Assessment: Defer to PT evaluation    Cervical / Trunk Assessment Cervical / Trunk Assessment: Normal  Communication   Communication Communication: No apparent difficulties    Cognition Arousal: Alert Behavior During Therapy: WFL for tasks assessed/performed   PT - Cognitive impairments: No apparent impairments                       PT - Cognition Comments: Pt A,Ox4 Following commands: Intact       Cueing Cueing Techniques: Verbal cues     General Comments General comments (skin integrity, edema, etc.): VSS on RA throughout session. Pt's husband and mother present throughout session.    Exercises     Assessment/Plan    PT Assessment Patient needs continued PT services  PT Problem List Cardiopulmonary status limiting activity;Decreased activity tolerance;Decreased balance;Decreased mobility       PT Treatment Interventions Gait training;Stair training;Functional mobility training;Therapeutic activities;Therapeutic exercise;Balance training;Neuromuscular re-education;Patient/family education    PT Goals (Current goals can be found in the Care Plan section)  Acute Rehab PT Goals Patient Stated Goal: Return Home and breath easier PT Goal Formulation: With patient/family Time For Goal Achievement: 04/11/23 Potential to Achieve Goals: Good    Frequency Min 2X/week     Co-evaluation               AM-PAC PT "6 Clicks" Mobility  Outcome  Measure Help needed turning from your back to your side while in a flat bed without using bedrails?: None Help needed moving from lying on your back to sitting on the side of a flat bed without using bedrails?: None Help needed moving to and from a bed to a chair (including a wheelchair)?: None Help needed standing up from a chair using your arms (e.g., wheelchair or bedside chair)?: None Help needed to walk in hospital room?: A Little Help needed climbing 3-5 steps with a railing? : A Little 6 Click Score: 22    End of Session Equipment Utilized During Treatment: Gait belt Activity Tolerance: Patient tolerated treatment well Patient left: in bed;with call bell/phone within reach;with family/visitor present Nurse Communication: Mobility status PT Visit Diagnosis: Unsteadiness on feet (R26.81)    Time: 1324-4010 PT Time Calculation (min) (ACUTE ONLY): 23 min   Charges:   PT Evaluation $PT Eval Low Complexity: 1 Low PT Treatments $Gait Training: 8-22 mins PT General Charges $$ ACUTE PT VISIT: 1 Visit         Cheri Guppy, PT, DPT Acute Rehabilitation Services Office: [phone] Secure Chat Preferred  Richardson Chiquito 03/28/2023, 11:03 AM

## 2023-04-01 NOTE — Progress Notes (Signed)
 Internal Medicine Clinic Attending  Case discussed with the resident at the time of the visit.  We reviewed the resident's history and exam and pertinent patient test results.  I agree with the assessment, diagnosis, and plan of care documented in the resident's note.

## 2023-04-02 ENCOUNTER — Encounter: Payer: Self-pay | Admitting: Student

## 2023-04-02 ENCOUNTER — Ambulatory Visit: Admitting: Student

## 2023-04-02 VITALS — BP 131/67 | HR 95 | Temp 98.3°F | Ht 60.0 in | Wt 190.2 lb

## 2023-04-02 DIAGNOSIS — Z7984 Long term (current) use of oral hypoglycemic drugs: Secondary | ICD-10-CM

## 2023-04-02 DIAGNOSIS — I5022 Chronic systolic (congestive) heart failure: Secondary | ICD-10-CM

## 2023-04-02 DIAGNOSIS — R790 Abnormal level of blood mineral: Secondary | ICD-10-CM

## 2023-04-02 DIAGNOSIS — I10 Essential (primary) hypertension: Secondary | ICD-10-CM

## 2023-04-02 DIAGNOSIS — Z7985 Long-term (current) use of injectable non-insulin antidiabetic drugs: Secondary | ICD-10-CM

## 2023-04-02 DIAGNOSIS — K219 Gastro-esophageal reflux disease without esophagitis: Secondary | ICD-10-CM | POA: Diagnosis not present

## 2023-04-02 DIAGNOSIS — E1169 Type 2 diabetes mellitus with other specified complication: Secondary | ICD-10-CM | POA: Diagnosis not present

## 2023-04-02 NOTE — Progress Notes (Signed)
 CC: HFU for HTN and dyspnea  HPI:  [name] is a 58 y.o. female living with a history stated below and presents today for HFU for hypertension and dyspnea. Please see problem based assessment and plan for additional details.  Past Medical History:  Diagnosis Date   Acute conjunctivitis, right eye 01/31/2022   Acute pain of left shoulder 06/07/2017   Adenomyosis 06/03/2012   Asthma    as a child   Asthma 11/07/2005   Reported Diagnosis in 2012, no formal spirometry    Cardiomyopathy (HCC)    likely nonischemic DCM per Dr. 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  Vitals:   04/02/23 1452  BP: 131/67  Pulse: 95  Temp: 98.3 F (36.8 C)  TempSrc: Oral  SpO2: 97%  Weight: 190 lb 3.2 oz (86.3 kg)  Height: 5' (1.524 m)    Physical Exam: Constitutional: well-appearing woman sitting in chair, in no acute distress Cardiovascular: regular rate and rhythm, no m/r/g Pulmonary/Chest: normal work of breathing on room air, lungs clear to auscultation bilaterally Abdominal: soft, non-tender, non-distended Skin: warm and dry Psych: normal mood and behavior   Assessment & Plan:   Patient discussed with Dr. Sol Blazing  She is here for follow up BP check after an in-office BP reading of 190 systolic last week but also had a brief hospitalization in the interim for dyspnea, during which she was diuresed 15 lbs.  Chronic HFrEF (heart failure with reduced ejection fraction) (HCC) Since last week she was hospitalized for dyspnea but not overt heart failure/hypoxia - this in context of missing many of her medicines. Although not clinically consistent with heart failure, gentle diuresis nevertheless removed 15L of fluid and her weight decreased from 205 -> 190 lbs, which she  is at today. Today her dyspnea is improving greatly, only mild and getting better. No orthopnea. Lung exam clear. No leg swelling. She is stable. Continue: - metoprolol 200 mg daily, Entresto 97-103 mg twice daily, Jardiance 10 mg daily, and spironolactone 25 mg daily - Consider  lasix in future if recurrent symptoms - Will check BMP (for K+) and Mg today which were low in previous hospitalization  Essential hypertension Under good control today. BP 131/67 today after systolics above 190 last week having missed many medicine doses. Headaches have resolved. No CP, palpitations.  - Continue current therapy per above  Type 2 diabetes mellitus with other specified complication (HCC) Denies acute complaints. No polyuria/polydipsia. No hypoglycemia, weakness, dizziness. She is adherent to her regimen. Next week she will set up her Dexcom with [name]. - Bring Dexcom to next office visit so we can help her put it on - Continue Trulicity 4.5 mg/week, Jardiance 10 mg daily, metformin 1000 mg twice daily - Continue glargine to 26 units nightly  GERD (gastroesophageal reflux disease) Has been on pantoprazole for some time. However has not had heartburn in over two months. She is willing to trial cessation. Will stop her protonix. Will advise her to take 1 pill every other day to prevent rebound for 2 weeks then stop altogether. Should symptoms return with force, can restart the medicine. - Taper off pantoprazole 40 qdto stop altogether in 2 weeks  RTC in 3 months for diabetes, HTN. At that time, please assess for ongoing dyspnea too.  Katheran James, D.O. [facility], PGY-1 Phone: [phone] Date 04/02/2023 Time 4:51 PM

## 2023-04-02 NOTE — Assessment & Plan Note (Signed)
 Denies acute complaints. No polyuria/polydipsia. No hypoglycemia, weakness, dizziness. She is adherent to her regimen. Next week she will set up her Dexcom with [name]. - Bring Dexcom to next office visit so we can help her put it on - Continue Trulicity 4.5 mg/week, Jardiance 10 mg daily, metformin 1000 mg twice daily - Continue glargine to 26 units nightly

## 2023-04-02 NOTE — Assessment & Plan Note (Signed)
 Has been on pantoprazole for some time. However has not had heartburn in over two months. She is willing to trial cessation. Will stop her protonix. Will advise her to take 1 pill every other day to prevent rebound for 2 weeks then stop altogether. Should symptoms return with force, can restart the medicine. - Taper off pantoprazole 40 qdto stop altogether in 2 weeks

## 2023-04-02 NOTE — Patient Instructions (Addendum)
 Ms Colantuono,  It is good to see you doing well.  Continue to take your medicines daily. Right now, your blood pressure and heart failure are under good control.  Please return next week to see [name] for your dexcom.  Please try stopping your Protonix/pantoprazole. Do not stop all at once, but take one pill every other day for 2 weeks then stop altogether.  Please return for general checkup in 3 months or sooner if needed.  -Dr. Ninfa Meeker

## 2023-04-02 NOTE — Assessment & Plan Note (Signed)
 Under good control today. BP 131/67 today after systolics above 190 last week having missed many medicine doses. Headaches have resolved. No CP, palpitations.  - Continue current therapy per above

## 2023-04-02 NOTE — Assessment & Plan Note (Addendum)
 Since last week she was hospitalized for dyspnea but not overt heart failure/hypoxia - this in context of missing many of her medicines. Although not clinically consistent with heart failure, gentle diuresis nevertheless removed 15L of fluid and her weight decreased from 205 -> 190 lbs, which she is at today. Today her dyspnea is improving greatly, only mild and getting better. No orthopnea. Lung exam clear. No leg swelling. She is stable. Continue: - metoprolol 200 mg daily, Entresto 97-103 mg twice daily, Jardiance 10 mg daily, and spironolactone 25 mg daily - Consider lasix in future if recurrent symptoms - Will check BMP (for K+) and Mg today which were low in previous hospitalization

## 2023-04-03 LAB — MAGNESIUM: Magnesium: 1.5 mg/dL — ABNORMAL LOW (ref 1.6–2.3)

## 2023-04-03 LAB — BMP8+ANION GAP
Anion Gap: 17 mmol/L (ref 10.0–18.0)
BUN/Creatinine Ratio: 17 (ref 9–23)
BUN: 18 mg/dL (ref 6–24)
CO2: 21 mmol/L (ref 20–29)
Calcium: 9.7 mg/dL (ref 8.7–10.2)
Chloride: 100 mmol/L (ref 96–106)
Creatinine, Ser: 1.04 mg/dL — ABNORMAL HIGH (ref 0.57–1.00)
Glucose: 118 mg/dL — ABNORMAL HIGH (ref 70–99)
Potassium: 4.5 mmol/L (ref 3.5–5.2)
Sodium: 138 mmol/L (ref 134–144)
eGFR: 63 mL/min/{1.73_m2} (ref >59)

## 2023-04-09 MED ORDER — MAGNESIUM OXIDE -MG SUPPLEMENT 400 MG PO CAPS: 400.0000 mg | ORAL_CAPSULE | Freq: Every day | ORAL | 0 refills | Status: AC

## 2023-04-09 MED ORDER — MAGNESIUM 30 MG PO TABS: 30.0000 mg | ORAL_TABLET | Freq: Every day | ORAL | 0 refills | Status: DC

## 2023-04-09 NOTE — Addendum Note (Signed)
 Addended by: Katheran James on: 04/09/2023 02:49 PM   Modules accepted: Orders

## 2023-04-09 NOTE — Addendum Note (Signed)
 Addended by: Katheran James on: 04/09/2023 03:23 PM   Modules accepted: Orders

## 2023-04-10 NOTE — Progress Notes (Signed)
 Internal Medicine Clinic Attending  Case discussed with the resident at the time of the visit.  We reviewed the resident's history and exam and pertinent patient test results.  I agree with the assessment, diagnosis, and plan of care documented in the resident's note.

## 2023-04-11 ENCOUNTER — Telehealth: Payer: Self-pay | Admitting: Dietician

## 2023-04-11 DIAGNOSIS — E1169 Type 2 diabetes mellitus with other specified complication: Secondary | ICD-10-CM

## 2023-04-11 NOTE — Telephone Encounter (Signed)
 coming in for help on Monday with Continuous glucose monitoring  Request referral.

## 2023-04-15 ENCOUNTER — Ambulatory Visit (INDEPENDENT_AMBULATORY_CARE_PROVIDER_SITE_OTHER): Admitting: Dietician

## 2023-04-15 ENCOUNTER — Other Ambulatory Visit: Payer: Self-pay | Admitting: Dietician

## 2023-04-15 ENCOUNTER — Encounter: Payer: Self-pay | Admitting: Dietician

## 2023-04-15 VITALS — Wt 195.7 lb

## 2023-04-15 DIAGNOSIS — E1169 Type 2 diabetes mellitus with other specified complication: Secondary | ICD-10-CM

## 2023-04-15 DIAGNOSIS — Z713 Dietary counseling and surveillance: Secondary | ICD-10-CM | POA: Diagnosis not present

## 2023-04-15 MED ORDER — DEXCOM G7 SENSOR MISC: 3 refills | Status: AC

## 2023-04-15 NOTE — Patient Instructions (Signed)
 You put on a Dexcom G7 Continuous glucose monitoring (CGM) today and we gave you a receiver.  I think your pone should work so if you ever want to use it in the future, call Dexcom Tech Support   Try not to get it wet until Tuesday then when wet pat dry do not rub.   Charge your receiver about 3-5 days as it tell you to do.   Keep the receiver within 20 feet of you at all times.   [name] [phone]

## 2023-04-15 NOTE — Telephone Encounter (Signed)
 Request dexcom G7 sensors

## 2023-04-15 NOTE — Progress Notes (Signed)
 Diabetes Self-Management Education  Visit Type: First/Initial  Appt. Start Time: 1520 Appt. End Time: 1610  04/15/2023  [name], identified by name and date of birth, is a 58 y.o. female with a diagnosis of Diabetes: Type 2.   ASSESSMENT Estimated body mass index is 38.22 kg/m as calculated from the following:   Height as of 04/02/23: 5' (1.524 m).   Weight as of this encounter: 195 lb 11.2 oz (88.8 kg). Wt Readings from Last 10 Encounters:  04/15/23 195 lb 11.2 oz (88.8 kg)  04/02/23 190 lb 3.2 oz (86.3 kg)  03/28/23 189 lb 9.6 oz (86 kg)  03/26/23 203 lb 4.8 oz (92.2 kg)  03/04/23 195 lb 1.6 oz (88.5 kg)  11/07/22 193 lb 8 oz (87.8 kg)  09/10/22 199 lb 1.6 oz (90.3 kg)  08/08/22 200 lb 3.2 oz (90.8 kg)  04/19/22 198 lb 6.4 oz (90 kg)  03/28/22 194 lb 6.4 oz (88.2 kg)   Lab Results  Component Value Date   HGBA1C 8.1 (A) 03/26/2023   HGBA1C 7.4 (A) 11/07/2022   HGBA1C 8.4 (H) 08/08/2022   HGBA1C 7.5 (A) 01/31/2022   HGBA1C 8.6 (A) 09/12/2021     Send sensors to [facility] on [address] 3 months of sensors Taking 26 units daily basaglar   Her Goal is 6.0% A1c    Diabetes Self-Management Education - 04/15/23 1600       Visit Information   Visit Type First/Initial      Initial Visit   Diabetes Type Type 2    Date Diagnosed 2011    Are you currently following a meal plan? --   deferred   Are you taking your medications as prescribed? Yes      Health Coping   How would you rate your overall health? --   deferred     Psychosocial Assessment   Patient Belief/Attitude about Diabetes Motivated to manage diabetes    What is the hardest part about your diabetes right now, causing you the most concern, or is the most worrisome to you about your diabetes?   Checking blood sugar;Making healty food and beverage choices    Self-care barriers Lack of material resources    Self-management support Doctor's office;Family;Friends;CDE visits    Patient Concerns Monitoring     Special Needs None    Preferred Learning Style Hands on    Learning Readiness Ready    How often do you need to have someone help you when you read instructions, pamphlets, or other written materials from your doctor or pharmacy? 2 - Rarely    What is the last grade level you completed in school? 12      Pre-Education Assessment   Patient understands monitoring blood glucose, interpreting and using results Needs Instruction    Patient understands prevention, detection, and treatment of acute complications. Needs Instruction      Complications   Last HgB A1C per patient/outside source 8.1 %    How often do you check your blood sugar? --   unknown   Fasting Blood glucose range (mg/dL) --   unknown   Postprandial Blood glucose range (mg/dL) --   unknown   Number of hypoglycemic episodes per month 0    Number of hyperglycemic episodes ( >200mg /dL): Weekly    Can you tell when your blood sugar is high? --   deferred   Have you had a dilated eye exam in the past 12 months? No    Have you had a dental  exam in the past 12 months? No    Are you checking your feet? No      Dietary Intake   Breakfast deferred      Activity / Exercise   Activity / Exercise Type --   deferred     Patient Education   Previous Diabetes Education Yes (please comment)   while in hospital   Monitoring Taught/evaluated CGM (comment)      Individualized Goals (developed by patient)   Monitoring  Consistenly use CGM      Post-Education Assessment   Patient understands monitoring blood glucose, interpreting and using results Needs Review      Outcomes   Expected Outcomes Demonstrated interest in learning. Expect positive outcomes    Future DMSE 2 wks    Program Status Not Completed             Individualized Plan for Diabetes Self-Management Training:   Learning Objective:  Patient will have a greater understanding of diabetes self-management. Patient education plan is to attend individual and/or  group sessions per assessed needs and concerns.   Plan:   Patient Instructions  You put on a Dexcom G7 Continuous glucose monitoring (CGM) today and we gave you a receiver.  I think your pone should work so if you ever want to use it in the future, call Dexcom Tech Support   Try not to get it wet until Tuesday then when wet pat dry do not rub.   Charge your receiver about 3-5 days as it tell you to do.   Keep the receiver within 20 feet of you at all times.   [name] [phone]     Expected Outcomes:  Demonstrated interest in learning. Expect positive outcomes  Education material provided: Diabetes Resources  If problems or questions, patient to contact team via:  Phone  Future DSME appointment: 2 wks- 3 wks [name] [name]

## 2023-04-17 ENCOUNTER — Encounter: Payer: Self-pay | Admitting: Physician Assistant

## 2023-04-17 ENCOUNTER — Ambulatory Visit: Attending: Physician Assistant | Admitting: Physician Assistant

## 2023-04-17 VITALS — BP 130/70 | HR 76 | Ht 60.0 in | Wt 193.0 lb

## 2023-04-17 DIAGNOSIS — R0602 Shortness of breath: Secondary | ICD-10-CM

## 2023-04-17 DIAGNOSIS — I10 Essential (primary) hypertension: Secondary | ICD-10-CM | POA: Diagnosis not present

## 2023-04-17 DIAGNOSIS — I5032 Chronic diastolic (congestive) heart failure: Secondary | ICD-10-CM | POA: Diagnosis not present

## 2023-04-17 NOTE — Assessment & Plan Note (Signed)
 BP better controlled now.  - Continue Toprol XL 200 mg daily. - Continue Entresto 97/103 mg twice daily. - Continue spironolactone 25 mg daily.

## 2023-04-17 NOTE — Progress Notes (Signed)
 Cardiology Office Note:    Date:  04/17/2023  ID:  Sandra [name], DOB 1965-10-26, MRN [id number] PCP: Chauncey Mann, DO  [facility] Providers Cardiologist:  Charlton Haws, MD       Patient Profile:      HFimpEF (heart failure with improved ejection fraction)  Non-ischemic cardiomyopathy Cardiac MRI 03/10/2012: EF 46 no LGE TTE 05/09/2017: EF 40-45 Stress MPI 05/20/2017 no ischemia, EF 38 TTE 11/28/2018: EF 30-35 TTE 10/22/2019: EF 40 TTE 03/22/2021: EF 50-55, no RWMA, normal RVSF, trivial MR, AV sclerosis Diabetes mellitus  Hypertension  Hyperlipidemia  Left Bundle Branch Block  Asthma        Discussed the use of AI scribe software for clinical note transcription with the patient, who gave verbal consent to proceed.  History of Present Illness Sandra [name] is a 58 y.o. female who returns for post hospital follow up. She was last seen by Dr. Eden Emms in March 2024.  She was admitted 3/5-3/6 with shortness of breath and uncontrolled blood pressure.  BNP was slightly elevated and she was gently diuresed.  Notes indicate she missed several of her medications prior to admission.  Of note, she did not have a repeat echocardiogram while hospitalized.  She is here alone.  She currently feels better, with improved breathing and better-controlled blood pressure. No chest discomfort, pain, or pressure, and no episodes of syncope. She can walk around stores and go up a flight of stairs without stopping, attributing any difficulty to lack of exercise. She uses six pillows at night but does not notice issues with lying flat. Her weight has been stable between 190 and 210 pounds over the past few years. She believes missing her blood pressure medication contributed to her symptoms. 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 She acknowledges the importance of regular exercise and has been  trying to incorporate more movement into her routine.   ROS-See HPI    Studies Reviewed:       Results LABS BNP: 147.2 (03/27/2023) K: 4.5 (04/02/2023) Cr: 1.04 (04/02/2023) Hb: 10.4 (03/28/2023) Troponins: 62 >>59 (03/27/2023) ALT: 27 (04/02/2023)     Risk Assessment/Calculations:             Physical Exam:   VS:  BP 130/70   Pulse 76   Ht 5' (1.524 m)   Wt 193 lb (87.5 kg)   SpO2 95%   BMI 37.69 kg/m    Wt Readings from Last 3 Encounters:  04/17/23 193 lb (87.5 kg)  04/15/23 195 lb 11.2 oz (88.8 kg)  04/02/23 190 lb 3.2 oz (86.3 kg)    Constitutional:      Appearance: Healthy appearance. Not in distress.  Neck:     Vascular: JVD normal.  Pulmonary:     Breath sounds: Normal breath sounds. No wheezing. No rales.  Cardiovascular:     Normal rate. Regular rhythm.     Murmurs: There is no murmur.  Edema:    Peripheral edema absent.  Abdominal:     Palpations: Abdomen is soft.        Assessment and Plan:   Assessment & Plan Heart failure with improved ejection fraction (HFimpEF) (HCC) EF improved from 30-35% to 50-55% as of March 2023. Recent admission for hypertensive urgency and mild volume overload, likely due to missed antihypertensives. Mildly elevated troponins consistent with demand ischemia. Currently NYHA Class II symptoms.  Volume  is stable.  Blood pressure better controlled.  - Order follow-up echocardiogram to reassess left ventricular function and rule out wall motion abnormalities. - Continue Jardiance 10 mg daily. - Continue Toprol XL 200 mg daily. - Continue Entresto 97/103 mg twice daily. - Continue spironolactone 25 mg daily. - Follow up 1 year or sooner if echocardiogram abnormal. Essential hypertension BP better controlled now.  - Continue Toprol XL 200 mg daily. - Continue Entresto 97/103 mg twice daily. - Continue spironolactone 25 mg daily. SOB (shortness of breath)       Dispo:  Return in about 1 year (around 04/16/2024) for  Routine Follow Up, w/ Dr. Eden Emms.  Signed, Tereso Newcomer, PA-C

## 2023-04-17 NOTE — Patient Instructions (Signed)
 Medication Instructions:  Your physician recommends that you continue on your current medications as directed. Please refer to the Current Medication list given to you today.  *If you need a refill on your cardiac medications before your next appointment, please call your pharmacy*   Lab Work: None ordered  If you have labs (blood work) drawn today and your tests are completely normal, you will receive your results only by: MyChart Message (if you have MyChart) OR A paper copy in the mail If you have any lab test that is abnormal or we need to change your treatment, we will call you to review the results.   Testing/Procedures: Your physician has requested that you have an echocardiogram. Echocardiography is a painless test that uses sound waves to create images of your heart. It provides your doctor with information about the size and shape of your heart and how well your heart's chambers and valves are working. This procedure takes approximately one hour. There are no restrictions for this procedure. Please do NOT wear cologne, perfume, aftershave, or lotions (deodorant is allowed). Please arrive 15 minutes prior to your appointment time.  Please note: We ask at that you not bring children with you during ultrasound (echo/ vascular) testing. Due to room size and safety concerns, children are not allowed in the ultrasound rooms during exams. Our front office staff cannot provide observation of children in our lobby area while testing is being conducted. An adult accompanying a patient to their appointment will only be allowed in the ultrasound room at the discretion of the ultrasound technician under special circumstances. We apologize for any inconvenience.    Follow-Up: At [facility], you and your health needs are our priority.  As part of our continuing mission to provide you with exceptional heart care, we have created designated Provider Care Teams.  These Care Teams include  your primary Cardiologist (physician) and Advanced Practice Providers (APPs -  Physician Assistants and Nurse Practitioners) who all work together to provide you with the care you need, when you need it.  We recommend signing up for the patient portal called "MyChart".  Sign up information is provided on this After Visit Summary.  MyChart is used to connect with patients for Virtual Visits (Telemedicine).  Patients are able to view lab/test results, encounter notes, upcoming appointments, etc.  Non-urgent messages can be sent to your provider as well.   To learn more about what you can do with MyChart, go to [email or web address].    Your next appointment:   1 year(s)  Provider:   Charlton Haws, MD     Other Instructions     1st Floor: - Lobby - Registration  - Pharmacy  - Lab - Cafe  2nd Floor: - PV Lab - Diagnostic Testing (echo, CT, nuclear med)  3rd Floor: - Vacant  4th Floor: - TCTS (cardiothoracic surgery) - AFib Clinic - Structural Heart Clinic - Vascular Surgery  - Vascular Ultrasound  5th Floor: - [facility]Care Cardiology (general and EP) - Clinical Pharmacy for coumadin, hypertension, lipid, weight-loss medications, and med management appointments    Valet parking services will be available as well.

## 2023-04-22 ENCOUNTER — Other Ambulatory Visit: Payer: Self-pay | Admitting: Internal Medicine

## 2023-04-22 DIAGNOSIS — E1169 Type 2 diabetes mellitus with other specified complication: Secondary | ICD-10-CM

## 2023-04-22 NOTE — Telephone Encounter (Signed)
 Medication last refilled 03/26/23 with 3 refills.

## 2023-04-30 ENCOUNTER — Other Ambulatory Visit: Payer: Self-pay | Admitting: Internal Medicine

## 2023-04-30 DIAGNOSIS — F411 Generalized anxiety disorder: Secondary | ICD-10-CM

## 2023-05-01 NOTE — Telephone Encounter (Signed)
 Medication sent to pharmacy

## 2023-05-06 ENCOUNTER — Encounter: Admitting: Dietician

## 2023-05-06 ENCOUNTER — Encounter: Payer: Self-pay | Admitting: Dietician

## 2023-05-15 ENCOUNTER — Ambulatory Visit (HOSPITAL_COMMUNITY): Attending: Cardiovascular Disease

## 2023-05-15 DIAGNOSIS — R0602 Shortness of breath: Secondary | ICD-10-CM | POA: Diagnosis present

## 2023-05-15 LAB — ECHOCARDIOGRAM COMPLETE
Area-P 1/2: 9.37 cm2
S' Lateral: 3.6 cm

## 2023-05-16 ENCOUNTER — Other Ambulatory Visit: Payer: Self-pay | Admitting: Internal Medicine

## 2023-05-16 DIAGNOSIS — E669 Obesity, unspecified: Secondary | ICD-10-CM

## 2023-05-21 ENCOUNTER — Ambulatory Visit (INDEPENDENT_AMBULATORY_CARE_PROVIDER_SITE_OTHER): Admitting: Orthopaedic Surgery

## 2023-05-21 DIAGNOSIS — M1711 Unilateral primary osteoarthritis, right knee: Secondary | ICD-10-CM

## 2023-05-21 MED ORDER — BUPIVACAINE HCL 0.5 % IJ SOLN
2.0000 mL | INTRAMUSCULAR | Status: AC | PRN
  Administered 2023-05-21: 2 mL via INTRA_ARTICULAR

## 2023-05-21 MED ORDER — LIDOCAINE HCL 1 % IJ SOLN
2.0000 mL | INTRAMUSCULAR | Status: AC | PRN
  Administered 2023-05-21: 2 mL

## 2023-05-21 MED ORDER — METHYLPREDNISOLONE ACETATE 40 MG/ML IJ SUSP
40.0000 mg | INTRAMUSCULAR | Status: AC | PRN
  Administered 2023-05-21: 40 mg via INTRA_ARTICULAR

## 2023-05-21 NOTE — Progress Notes (Signed)
 Office Visit Note   Patient: Sandra [name]           Date of Birth: 1965/07/28           MRN: [id number] Visit Date: 05/21/2023              Requested by: Atway, Rayann N, DO [address] [place],  [place] [address] PCP: Vicente Graham, DO   Assessment & Plan: Visit Diagnoses:  1. Primary osteoarthritis of right knee     Plan: Assessment and Plan    Right knee pain Chronic pain with recent exacerbation. Previous gel injection provided temporary relief. She requested cortisone injection and was informed about the 90-day surgery restriction post-injection, which she accepted. - Administer cortisone injection to the right knee today. - Contact Debbie to check on the status of surgical clearance. - Schedule surgery for after 90 days, pending surgical clearance and her availability.      Follow-Up Instructions: No follow-ups on file.   Orders:  Orders Placed This Encounter  Procedures   Large Joint Inj: R knee   No orders of the defined types were placed in this encounter.     Procedures: Large Joint Inj: R knee on 05/21/2023 3:18 PM Indications: pain Details: 22 G needle  Arthrogram: No  Medications: 40 mg methylPREDNISolone  acetate 40 MG/ML; 2 mL lidocaine  1 %; 2 mL bupivacaine  0.5 % Consent was given by the patient. Patient was prepped and draped in the usual sterile fashion.       Clinical Data: No additional findings.   Subjective: Chief Complaint  Patient presents with   Right Knee - Pain    HPI Discussed the use of AI scribe software for clinical note transcription with the patient, who gave verbal consent to proceed.  History of Present Illness   Sandra [name] is a 58 year old female who presents with right knee pain for follow-up and injection.  She was scheduled for surgery for her right knee pain but has not received complete surgical clearance. One physician provided clearance, but she is unsure about the other. She previously  tried a gel treatment which did not seem to help initially, but her knee had not been hurting until the last couple of months, suggesting the gel might have provided some relief. She has paperwork filled out for taking days off work when her knee pain is severe, although she has not used it yet but wants to renew it just in case.     Review of Systems  Constitutional: Negative.   HENT: Negative.    Eyes: Negative.   Respiratory: Negative.    Cardiovascular: Negative.   Endocrine: Negative.   Musculoskeletal: Negative.   Neurological: Negative.   Hematological: Negative.   Psychiatric/Behavioral: Negative.    All other systems reviewed and are negative.    Objective: Vital Signs: There were no vitals taken for this visit.  Physical Exam Vitals and nursing note reviewed.  Constitutional:      Appearance: She is well-developed.  HENT:     Head: Atraumatic.     Nose: Nose normal.  Eyes:     Extraocular Movements: Extraocular movements intact.  Cardiovascular:     Pulses: Normal pulses.  Pulmonary:     Effort: Pulmonary effort is normal.  Abdominal:     Palpations: Abdomen is soft.  Musculoskeletal:     Cervical back: Neck supple.  Skin:    General: Skin is warm.     Capillary Refill:  Capillary refill takes less than 2 seconds.  Neurological:     Mental Status: She is alert. Mental status is at baseline.  Psychiatric:        Behavior: Behavior normal.        Thought Content: Thought content normal.        Judgment: Judgment normal.     Ortho Exam Physical Exam   Right knee exam is unchanged from prior visit.     Specialty Comments:  No specialty comments available.  Imaging: No results found.   PMFS History: Patient Active Problem List   Diagnosis Date Noted   Exertional dyspnea 03/27/2023   Right knee pain 08/08/2022   Macrocytic anemia 08/08/2022   Trigger index finger of left hand 08/08/2022   Pigmented skin lesion of uncertain behavior of head  02/01/2022   GERD (gastroesophageal reflux disease) 10/15/2019   Generalized anxiety disorder 09/21/2019   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   Influenza 02/23/2016   Psoriasis 04/29/2013   Morbid obesity (HCC) 05/16/2012   Chronic HFrEF (heart failure with reduced ejection fraction) (HCC) 02/22/2012   LBBB (left bundle branch block) 01/24/2012   Routine adult health maintenance 09/27/2010   Type 2 diabetes mellitus with other specified complication (HCC) 11/17/2009   Hyperlipidemia 08/20/2007   Migraine without aura 09/02/2006   Essential hypertension 07/08/2006   Asthma 11/07/2005   Past Medical History:  Diagnosis Date   Acute conjunctivitis, right eye 01/31/2022   Acute pain of left shoulder 06/07/2017   Adenomyosis 06/03/2012   Asthma    as a child   Asthma 11/07/2005   Reported Diagnosis in 2012, no formal spirometry    Cardiomyopathy (HCC)    likely nonischemic DCM per Dr. 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   Perimenopausal menorrhagia 02/20/2012   Perimenopausal menorrhagia 02/20/2012   Psoriasis 04/29/2013   Right foot strain 05/15/2021   Right low back pain 03/02/2014   Routine adult health maintenance 09/27/2010   Swelling of abdominal wall 03/02/2014   Swelling of abdominal wall 03/02/2014   Symptoms of upper respiratory infection (URI) 04/01/2018   Tinnitus 06/01/2020   Viral illness 10/08/2013   Viral URI with cough 01/13/2013    Family History  Problem Relation Age of Onset   Hypertension Father    Diabetes  Father     Past Surgical History:  Procedure Laterality Date   CHOLECYSTECTOMY  2009   DILATATION & CURETTAGE/HYSTEROSCOPY WITH MYOSURE N/A 12/25/2017   Procedure: DILATATION & CURETTAGE/HYSTEROSCOPY   POLYPECTOMY WITH MYOSURE;  Surgeon: Zora Hires, MD;  Location: [facility];  Service: Gynecology;  Laterality: N/A;   TUBAL LIGATION     Social History   Occupational History   Occupation: Building surveyor  Tobacco Use   Smoking status: Never   Smokeless tobacco: Never  Vaping Use   Vaping status: Never Used  Substance and Sexual Activity   Alcohol use: Yes    Alcohol/week: 0.0 standard drinks of alcohol    Comment: occ   Drug use: No   Sexual activity: Yes    Birth control/protection: Surgical

## 2023-06-06 ENCOUNTER — Telehealth: Payer: Self-pay | Admitting: Orthopaedic Surgery

## 2023-06-06 NOTE — Telephone Encounter (Signed)
 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 Pt is on intermediate for 2 days a week. Pt is asking for it to go to her mychart. Pt phone number is [phone].

## 2023-06-07 ENCOUNTER — Telehealth: Payer: Self-pay | Admitting: Orthopaedic Surgery

## 2023-06-07 NOTE — Telephone Encounter (Signed)
 Ok to do note

## 2023-06-07 NOTE — Telephone Encounter (Signed)
Note made.  

## 2023-06-07 NOTE — Telephone Encounter (Signed)
 Pt called about update for letter to give to her employer today if possible. Please see last chart note and send to pt mychart so she can turn note into her employer per Dr Christiane Cowing approval. Please call pt about this matter at [phone].

## 2023-06-22 ENCOUNTER — Other Ambulatory Visit: Payer: Self-pay | Admitting: Internal Medicine

## 2023-06-22 DIAGNOSIS — E785 Hyperlipidemia, unspecified: Secondary | ICD-10-CM

## 2023-06-24 NOTE — Telephone Encounter (Signed)
 Medication sent to pharmacy

## 2023-06-26 ENCOUNTER — Telehealth: Payer: Self-pay | Admitting: *Deleted

## 2023-06-26 NOTE — Telephone Encounter (Signed)
 The first one stayed on and several others after than per patient. The last 3 she got from [employer] cane off. I gave her dexcom tech support number and tips for proper adhesion and placement of the sensor. She verbalized understanding. I will try to see her at her doctor appointment next week.

## 2023-06-26 NOTE — Telephone Encounter (Signed)
 Thank you Abe Abed for calling/talking to the pt.

## 2023-06-26 NOTE — Telephone Encounter (Signed)
 Copied from CRM [id number]. Topic: Clinical - Medication Question >> Jun 26, 2023  3:20 PM Retta Caster wrote: Reason for CRM: Continuous Glucose Sensor (DEXCOM G7 SENSOR) MISC Patient stated all 3 fell off on her not staying on. She has all 3 in hand still as well. Need replacement. Need call back [phone]

## 2023-06-27 NOTE — Progress Notes (Signed)
 "    OFFICE NOTE Date:  06/28/2023  ID:  Sandra [name], DOB Jun 08, 1965, MRN [id number] PCP: Jimmy Anna SAILOR, DO  [facility] Providers Cardiologist:  Maude Emmer, MD     Patient Profile:     HFimpEF (heart failure with improved ejection fraction)  Non-ischemic cardiomyopathy Cardiac MRI 03/10/2012: EF 46 no LGE TTE 05/09/2017: EF 40-45 Stress MPI 05/20/2017 no ischemia, EF 38 TTE 11/28/2018: EF 30-35 TTE 10/22/2019: EF 40 TTE 03/22/2021: EF 50-55, no RWMA, normal RVSF, trivial MR, AV sclerosis TTE 05/15/2023: EF 35-40, global HK, normal RVSF, mild LAE, trivial MR  Diabetes mellitus  Hypertension  Hyperlipidemia  Left Bundle Branch Block  Asthma       Discussed the use of AI scribe software for clinical note transcription with the patient, who gave verbal consent to proceed. History of Present Illness Sandra [name] is a 58 y.o. female who returns for follow-up of CHF.  She was last seen 04/17/2023 after admission to the hospital with decompensated heart failure.  She had been off of some of her medications.  These were resumed.  A follow-up echocardiogram was obtained and demonstrated EF 35-40.  Previous echo in March 2023 demonstrated EF 50-55.  I reviewed this with Dr. Emmer who felt that her EF was around 40-45 on the previous study.  She returns for further titration of GDMT.  She is here alone. She reports doing okay since her last visit and confirms adherence to her medications, which include Jardiance , Toprol  (metoprolol ), Entresto , and spironolactone . She occasionally monitors her blood pressure at home but does not recall the specific readings. No symptoms of fluid retention, such as swelling in her legs, except for swelling in her knee, which she attributes to a need for knee replacement. No chest discomfort, heaviness, pressure, or tightness, and no syncope. She can walk up a flight of stairs without stopping to catch her breath, although she has not been walking regularly  for about a year. She uses multiple pillows at night, which is her usual habit and not due to orthopnea. No fever, cough, hematochezia, or hematuria. She does not smoke.    ROS-See HPI     Studies Reviewed:        Risk Assessment/Calculations:   HYPERTENSION CONTROL Vitals:   06/28/23 0819 06/28/23 1240  BP: (!) 148/64 (!) 156/84    The patient's blood pressure is elevated above target today.  In order to address the patient's elevated BP: A new medication was prescribed today.         Physical Exam:  VS:  BP (!) 156/84   Pulse 92   Ht 5' (1.524 m)   Wt 191 lb 6.4 oz (86.8 kg)   SpO2 95%   BMI 37.38 kg/m    Wt Readings from Last 3 Encounters:  06/28/23 191 lb 6.4 oz (86.8 kg)  04/17/23 193 lb (87.5 kg)  04/15/23 195 lb 11.2 oz (88.8 kg)    Constitutional:      Appearance: Healthy appearance. Not in distress.  Neck:     Vascular: JVD normal.  Pulmonary:     Breath sounds: Normal breath sounds. No wheezing. No rales.  Cardiovascular:     Normal rate. Regular rhythm.     Murmurs: There is no murmur.  Edema:    Peripheral edema absent.  Abdominal:     Palpations: Abdomen is soft.         Assessment and Plan: Assessment & Plan Chronic HFrEF (heart failure  with reduced ejection fraction) (HCC) Nonischemic cardiomyopathy.  NYHA II-IIb.  Volume status stable.  EF previously 40-45.  Most recent echo demonstrated worsening LV function with EF 35-40.  We discussed the benefits of hydralazine  and nitrates in addition to beta-blocker, MRA, ARNI, SGLT2 inhibitor.  Blood pressure remains uncontrolled. - Continue Jardiance  10 mg daily. - Continue Toprol  XL 200 mg daily. - Continue Entresto  97/103 mg twice daily. - Continue spironolactone  25 mg daily. - Start hydralazine  25 mg 3 times a day - Follow-up 3 months - Consider adding isosorbide Essential hypertension Blood pressure uncontrolled.   - Start hydralazine  25 mg 3 times a day as noted.   - Continue Toprol -XL 200  mg daily, Entresto  97/103 mg twice daily, spironolactone  25 mg daily. - Monitor blood  pressure and bring readings to next visit      Dispo:  Return in about 3 months (around 09/28/2023). Signed, [name], PA-C   "

## 2023-06-28 ENCOUNTER — Encounter: Payer: Self-pay | Admitting: Physician Assistant

## 2023-06-28 ENCOUNTER — Ambulatory Visit: Attending: Physician Assistant | Admitting: Physician Assistant

## 2023-06-28 VITALS — BP 156/84 | HR 92 | Ht 60.0 in | Wt 191.4 lb

## 2023-06-28 DIAGNOSIS — I5022 Chronic systolic (congestive) heart failure: Secondary | ICD-10-CM | POA: Diagnosis not present

## 2023-06-28 DIAGNOSIS — I10 Essential (primary) hypertension: Secondary | ICD-10-CM

## 2023-06-28 MED ORDER — HYDRALAZINE HCL 25 MG PO TABS: 25.0000 mg | ORAL_TABLET | Freq: Three times a day (TID) | ORAL | 3 refills | Status: DC

## 2023-06-28 NOTE — Assessment & Plan Note (Signed)
 Blood pressure uncontrolled.   - Start hydralazine 25 mg 3 times a day as noted.   - Continue Toprol -XL 200 mg daily, Entresto  97/103 mg twice daily, spironolactone  25 mg daily. - Monitor blood  pressure and bring readings to next visit

## 2023-06-28 NOTE — Assessment & Plan Note (Signed)
 Nonischemic cardiomyopathy.  NYHA II-IIb.  Volume status stable.  EF previously 40-45.  Most recent echo demonstrated worsening LV function with EF 35-40.  We discussed the benefits of hydralazine and nitrates in addition to beta-blocker, MRA, ARNI, SGLT2 inhibitor.  Blood pressure remains uncontrolled. - Continue Jardiance  10 mg daily. - Continue Toprol  XL 200 mg daily. - Continue Entresto  97/103 mg twice daily. - Continue spironolactone  25 mg daily. - Start hydralazine 25 mg 3 times a day - Follow-up 3 months - Consider adding isosorbide

## 2023-06-28 NOTE — Patient Instructions (Signed)
 Medication Instructions:  START TAKING HYDRALAZINE 25 MG THREE TIMES DAILY. CONTINUE WITH ALL OTHER MEDICATION THERAPY.   Lab Work: NONE   Testing/Procedures: NONE  Follow-Up: At [facility], you and your health needs are our priority.  As part of our continuing mission to provide you with exceptional heart care, our providers are all part of one team.  This team includes your primary Cardiologist (physician) and Advanced Practice Providers or APPs (Physician Assistants and Nurse Practitioners) who all work together to provide you with the care you need, when you need it.  Your next appointment:   3 MONTHS  Provider:   Janelle Mediate, MD OR Marlyse Single, PA    Other Instructions PLEASE TAKE AND RECORD YOUR BLOOD PRESSURE AND HEART RATE EACH DAY AND BRING THE READINGS TO YOUR NEXT SCHEDULED APPOINTMENT.

## 2023-07-03 ENCOUNTER — Other Ambulatory Visit: Payer: Self-pay

## 2023-07-03 ENCOUNTER — Encounter: Payer: Self-pay | Admitting: Dietician

## 2023-07-03 ENCOUNTER — Ambulatory Visit: Admitting: Internal Medicine

## 2023-07-03 ENCOUNTER — Encounter: Payer: Self-pay | Admitting: Internal Medicine

## 2023-07-03 VITALS — BP 135/60 | HR 81 | Temp 98.1°F | Ht 60.0 in | Wt 189.0 lb

## 2023-07-03 DIAGNOSIS — E1169 Type 2 diabetes mellitus with other specified complication: Secondary | ICD-10-CM | POA: Diagnosis not present

## 2023-07-03 DIAGNOSIS — Z794 Long term (current) use of insulin: Secondary | ICD-10-CM | POA: Diagnosis not present

## 2023-07-03 DIAGNOSIS — K219 Gastro-esophageal reflux disease without esophagitis: Secondary | ICD-10-CM

## 2023-07-03 DIAGNOSIS — I5022 Chronic systolic (congestive) heart failure: Secondary | ICD-10-CM

## 2023-07-03 DIAGNOSIS — Z7984 Long term (current) use of oral hypoglycemic drugs: Secondary | ICD-10-CM

## 2023-07-03 LAB — POCT GLYCOSYLATED HEMOGLOBIN (HGB A1C): Hemoglobin A1C: 7.7 % — AB (ref 4.0–5.6)

## 2023-07-03 LAB — GLUCOSE, CAPILLARY: Glucose-Capillary: 103 mg/dL — ABNORMAL HIGH (ref 70–99)

## 2023-07-03 NOTE — Patient Instructions (Addendum)
 Sandra you, [name] for allowing us  to provide your care today. Today we discussed:  Blood pressure Keep taking metoprolol  twice a day and entresto  twice a day Keep taking spironolactone  once a day and jardiance  once a day Continue taking hydralazine  THREE times a day - set a reminder for the afternoon dose, since this is the one you are forgetting   Diabetes Keep taking jardiance  everyday Keep taking metformin  twice a day Keep taking Trulicity  once a week Continue insulin  basaglar  26 units daily  I have ordered the following labs for you:   Lab Orders         Glucose, capillary         POC Hbg A1C       Referrals ordered today:   Referral Orders  No referral(s) requested today     I have ordered the following medication/changed the following medications:   Stop the following medications: There are no discontinued medications.   Start the following medications: No orders of the defined types were placed in this encounter.    Follow up: 3 months     Should you have any questions or concerns please call the internal medicine clinic at [phone].     Sandra [name], D.O. [facility]

## 2023-07-03 NOTE — Progress Notes (Signed)
 Met with patient briefly to give her samples of over-patches for her Continuous glucose monitoring sensor. Also provided her with tips to improve adhesion of sensor. She verbalized understanding.  Marlene Simas, RD 07/03/2023 4:05 PM.

## 2023-07-03 NOTE — Assessment & Plan Note (Signed)
 The patient was tapered off of pantoprazole  at her last office visit in March.  The patient states that her GERD has been stable for the most part, but she does have to take a dose of pantoprazole  every now and then when she gets reflux symptoms.  She states that on average she is maybe taking the Protonix  once every 2 weeks.

## 2023-07-03 NOTE — Assessment & Plan Note (Addendum)
 A1c is improved from 8.1% to 7.7% today.  The patient has been taking Trulicity  4.5 mg/week, Jardiance  10 mg daily, metformin  1 g twice daily, and Basaglar  26 units at bedtime.  She has not been checking her blood sugar, as her Dexcom has been falling off recently, and she was waiting until she met with Abe Abed today to discuss ways to help adhere it to her skin better.  She does describe one episode this week where she felt like her blood sugar was low, as she felt shaky and sweaty, but this does not routinely happen otherwise.  We discussed not making any changes to her insulin  at this time until she is able to consistently check her blood sugar.  Plan: -Continue Trulicity  4.5 mg/week - Continue Jardiance  10 mg daily - Continue metformin  1 g twice daily - Continue Basaglar  26 units at bedtime - Follow-up in 3 months for A1c recheck -Follow-up with [place] retina specialist

## 2023-07-03 NOTE — Assessment & Plan Note (Addendum)
 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  She last saw cardiology on 6/6 and was advised to continue taking metoprolol  200 mg daily, Jardiance  10 mg daily, Entresto  97-1 to 3 mg twice daily, spironolactone  25 mg daily, and she was started on hydralazine  25 mg 3 times daily.  Blood pressure today was elevated to 146/61 and improved to 135/60 upon recheck.  The patient states that it has been difficult for her to take hydralazine  3 times a day, and she notes that she often misses the afternoon dose of this.  Plan: - Continue Jardiance , metoprolol , Entresto , spironolactone , and hydralazine  as above - Advised patient to set a reminder/alarm for her afternoon dose of hydralazine  that she has been missing - Follow-up with cardiology around September

## 2023-07-03 NOTE — Progress Notes (Signed)
 CC: HTN and DM  HPI:  [name] is a 58 y.o. female living with a history stated below and presents today for a follow up of her hypertension and diabetes. Please see problem based assessment and plan for additional details.  Past Medical History:  Diagnosis Date   Acute conjunctivitis, right eye 01/31/2022   Acute pain of left shoulder 06/07/2017   Adenomyosis 06/03/2012   Asthma    as a child   Asthma 11/07/2005   Reported Diagnosis in 2012, no formal spirometry    Cardiomyopathy (HCC)    likely nonischemic DCM per Dr. 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 Swallow whole. 30 tablet 9   atorvastatin  (LIPITOR ) 80 MG tablet Take 1 tablet by mouth once daily 90 tablet 0   Continuous Glucose Sensor (DEXCOM G7 SENSOR) MISC Place new sensor every 10 days. Use to monitor blood sugar continuously. 9 each 3   Dulaglutide  (TRULICITY ) 4.5 MG/0.5ML SOAJ INJECT 4 & 1/2 (FOUR & ONE-HALF) MG  ONCE A WEEK 4 mL 2   empagliflozin  (JARDIANCE ) 10 MG TABS tablet Take 1 tablet (10 mg total) by mouth daily before breakfast. 90 tablet 3   escitalopram  (LEXAPRO ) 10 MG tablet Take 1 tablet by mouth once daily 90 tablet 0   fluticasone  (FLONASE ) 50 MCG/ACT nasal spray Place 1-2 sprays into both nostrils daily as needed for allergies or rhinitis.     hydrALAZINE  (APRESOLINE ) 25 MG tablet Take 1 tablet (25 mg total) by mouth 3 (three) times daily. 270 tablet 3   Insulin  Glargine (BASAGLAR   KWIKPEN) 100 UNIT/ML Inject 26 Units into the skin at bedtime. 15 mL 3   metFORMIN  (GLUCOPHAGE ) 1000 MG tablet Take 1 tablet (1,000 mg total) by mouth 2 (two) times daily with a meal. 180 tablet 3   metoprolol  (TOPROL -XL) 200 MG 24 hr tablet Take 1 tablet (200 mg total) by mouth daily. 90 tablet 3   sacubitril -valsartan  (ENTRESTO ) 97-103 MG Take 1 tablet by mouth 2 (two) times daily. 180 tablet 3   spironolactone   (ALDACTONE ) 25 MG tablet Take 1 tablet (25 mg total) by mouth daily. 90 tablet 3   No current facility-administered medications on file prior to visit.    Family History  Problem Relation Age of Onset   Hypertension Father    Diabetes Father     Social History   Socioeconomic History   Marital status: Married    Spouse name: Not on file   Number of children: Not on file   Years of education: Not on file   Highest education level: Not on file  Occupational History   Occupation: Building surveyor  Tobacco Use   Smoking status: Never   Smokeless tobacco: Never  Vaping Use   Vaping status: Never Used  Substance and Sexual Activity   Alcohol use: Yes    Alcohol/week: 0.0 standard drinks of alcohol    Comment: occ   Drug use: No   Sexual activity: Yes    Birth control/protection: Surgical  Other Topics Concern   Not on file  Social History Narrative   Studying at [employer] in early childhood education.   Married.   Social Drivers of Corporate investment banker Strain: Low Risk  (01/31/2022)   Overall Financial Resource Strain (CARDIA)    Difficulty of Paying Living Expenses: Not hard at all  Food Insecurity: No Food Insecurity (03/27/2023)   Hunger Vital Sign    Worried About Running Out of Food in the Last Year: Never true    Ran Out of Food in the Last Year: Never true  Transportation Needs: No Transportation Needs (03/27/2023)   PRAPARE - Administrator, Civil Service (Medical): No    Lack of Transportation (Non-Medical): No  Physical Activity: Not on file  Stress: No Stress Concern Present (01/31/2022)   [employer] of Occupational Health - Occupational Stress Questionnaire    Feeling of Stress : Not at all  Social Connections: Moderately Integrated (03/27/2023)   Social Connection and Isolation Panel [NHANES]    Frequency of Communication with Friends and Family: More than three times a week    Frequency of Social Gatherings with Friends and Family: More  than three times a week    Attends Religious Services: More than 4 times per year    Active Member of [employer] or Organizations: No    Attends Banker Meetings: Never    Marital Status: Married  Catering manager Violence: Not At Risk (03/27/2023)   Humiliation, Afraid, Rape, and Kick questionnaire    Fear of Current or Ex-Partner: No    Emotionally Abused: No    Physically Abused: No    Sexually Abused: No    Review of Systems: ROS negative except for what is noted on the assessment and plan.  Vitals:   07/03/23 1537 07/03/23 1540  BP: (!) 146/61 135/60  Pulse: 86 81  Temp: 98.1 F (36.7 C)   TempSrc: Oral   SpO2: 100%   Weight: 189 lb (85.7 kg)   Height: 5' (1.524 m)  Physical Exam: Constitutional: well-appearing female sitting in chair, in no acute distress Cardiovascular: regular rate and rhythm, no m/r/g Pulmonary/Chest: normal work of breathing on room air MSK: normal bulk and tone Neurological: alert & oriented x 3, no focal deficit Skin: warm and dry Psych: normal mood and behavior  Assessment & Plan:   Patient discussed with Dr. 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  She last saw cardiology on 6/6 and was advised to continue taking metoprolol  200 mg daily, Jardiance  10 mg daily, Entresto  97-1 to 3 mg twice daily, spironolactone  25 mg daily, and she was started on hydralazine  25 mg 3 times daily.  Blood pressure today was elevated to 146/61 and improved to 135/60 upon recheck.  The patient states that it has been difficult for her to take hydralazine  3 times a day, and she notes that she often misses the afternoon dose of this.  Plan: - Continue Jardiance , metoprolol , Entresto , spironolactone , and hydralazine  as above - Advised patient to set a reminder/alarm for her afternoon dose of hydralazine  that she has been  missing - Follow-up with cardiology around September   Type 2 diabetes mellitus with other specified complication (HCC) A1c is improved from 8.1% to 7.7% today.  The patient has been taking Trulicity  4.5 mg/week, Jardiance  10 mg daily, metformin  1 g twice daily, and Basaglar  26 units at bedtime.  She has not been checking her blood sugar, as her Dexcom has been falling off recently, and she was waiting until she met with Abe Abed today to discuss ways to help adhere it to her skin better.  She does describe one episode this week where she felt like her blood sugar was low, as she felt shaky and sweaty, but this does not routinely happen otherwise.  We discussed not making any changes to her insulin  at this time until she is able to consistently check her blood sugar.  Plan: -Continue Trulicity  4.5 mg/week - Continue Jardiance  10 mg daily - Continue metformin  1 g twice daily - Continue Basaglar  26 units at bedtime - Follow-up in 3 months for A1c recheck -Follow-up with [place] retina specialist  GERD (gastroesophageal reflux disease) The patient was tapered off of pantoprazole  at her last office visit in March.  The patient states that her GERD has been stable for the most part, but she does have to take a dose of pantoprazole  every now and then when she gets reflux symptoms.  She states that on average she is maybe taking the Protonix  once every 2 weeks.   Aidenn Skellenger, D.O. [facility], PGY-3 Phone: [phone] Date 07/03/2023 Time 4:29 PM

## 2023-07-04 NOTE — Telephone Encounter (Signed)
 Pt  Copied from CRM [id number]. Topic: General - Running Late >> Jul 03, 2023  3:19 PM Sandra [name] wrote: Patient called and stated she is a few seconds away and can see the building, she went to the old location

## 2023-07-11 NOTE — Progress Notes (Signed)
 Internal Medicine Clinic Attending  Case discussed with the resident at the time of the visit.  We reviewed the resident's history and exam and pertinent patient test results.  I agree with the assessment, diagnosis, and plan of care documented in the resident's note.

## 2023-07-24 ENCOUNTER — Telehealth: Payer: Self-pay | Admitting: Orthopaedic Surgery

## 2023-07-24 NOTE — Telephone Encounter (Signed)
 FMLA form received. IC patient,lmvm for patient to [facility]. Need to know what kind of leave she is seeking. 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

## 2023-07-25 ENCOUNTER — Telehealth: Payer: Self-pay | Admitting: Orthopaedic Surgery

## 2023-07-25 NOTE — Telephone Encounter (Signed)
 Patient is requesting intermittent leave for flare-ups 2 x week lasting 2 days. Please advise. Thank you!

## 2023-07-25 NOTE — Telephone Encounter (Signed)
 Sure. thanks

## 2023-08-15 ENCOUNTER — Ambulatory Visit: Payer: Self-pay

## 2023-08-15 ENCOUNTER — Ambulatory Visit

## 2023-08-15 VITALS — BP 156/92 | HR 83 | Temp 99.2°F | Ht 60.0 in | Wt 193.6 lb

## 2023-08-15 DIAGNOSIS — R058 Other specified cough: Secondary | ICD-10-CM

## 2023-08-15 DIAGNOSIS — R051 Acute cough: Secondary | ICD-10-CM

## 2023-08-15 MED ORDER — DEXTROMETHORPHAN HBR 15 MG/15ML PO LIQD: 10.0000 mL | Freq: Three times a day (TID) | ORAL | 1 refills | Status: DC

## 2023-08-15 MED ORDER — FLUTICASONE PROPIONATE 50 MCG/ACT NA SUSP: 1.0000 | Freq: Every day | NASAL | 1 refills | Status: AC | PRN

## 2023-08-15 MED ORDER — BUDESONIDE-FORMOTEROL FUMARATE 160-4.5 MCG/ACT IN AERO: 1.0000 | INHALATION_SPRAY | Freq: Two times a day (BID) | RESPIRATORY_TRACT | 12 refills | Status: AC | PRN

## 2023-08-15 NOTE — Telephone Encounter (Signed)
 Patient is scheduled to see pcp this afternoon.

## 2023-08-15 NOTE — Assessment & Plan Note (Deleted)
 58 year old lady past medical history of type 2 diabetes, hypertension taking Lantus  insulin  Trulicity , metformin , metoprolol , hydralazine  came here [place] chief complaint of cough started from Friday after flu symptoms, it was productive and right now it getting more dry, no fever, positive for nasal congestion.  Positive history of asthma before. assessment and plan is to start dextrometorphan, Flonase , and albuterol 

## 2023-08-15 NOTE — Assessment & Plan Note (Addendum)
 She presents with cough that started 5 days ago after flu-like symptoms. Initially productive, now more dry. Denies fever but reports nasal congestion.   Lung exam: clear bilaterally, no wheezes or crackles. No jugular venous distention. Patient appears euvolemic with no peripheral swelling. Vital signs stable. No fever.  Assessment: PND and reactive airway disease likely secondary to recent viral illness  Plan: Dextromethorphan  syrup for cough relief Flonase  spray for congestion Symbicort  inhaler as needed for asthma flare-ups

## 2023-08-15 NOTE — Telephone Encounter (Signed)
 FYI Only or Action Required?: Action required by provider: request for appointment.  Patient was last seen in primary care on 07/03/2023 by Atway, Rayann N, DO.  Called Nurse Triage reporting Cough.  Symptoms began several days ago.  Interventions attempted: OTC medications: Robitussin.  Symptoms are: gradually worsening. Productive cough with yellow mucus x 1 week.  Triage Disposition: See Physician Within 24 Hours  Patient/caregiver understands and will follow disposition?: Yes   Copied from CRM #[id number]. Topic: Clinical - Red Word Triage >> Aug 15, 2023 10:34 AM Miquel SAILOR wrote: Red Word that prompted transfer to Nurse Triage: Cough since 07/18-  99.4 lowgrade fever Cough getting worse last 1-2 days/ Yellow mucus/can not sleep withing medicine Reason for Disposition  [1] Continuous (nonstop) coughing interferes with work or school AND [2] no improvement using cough treatment per Care Advice  Answer Assessment - Initial Assessment Questions 1. ONSET: When did the cough begin?      1 week 2. SEVERITY: How bad is the cough today?      severe 3. SPUTUM: Describe the color of your sputum (e.g., none, dry cough; clear, white, yellow, green)     yellow 4. HEMOPTYSIS: Are you coughing up any blood? If Yes, ask: How much? (e.g., flecks, streaks, tablespoons, etc.)     no 5. DIFFICULTY BREATHING: Are you having difficulty breathing? If Yes, ask: How bad is it? (e.g., mild, moderate, severe)      no 6. FEVER: Do you have a fever? If Yes, ask: What is your temperature, how was it measured, and when did it start?     99.4 7. CARDIAC HISTORY: Do you have any history of heart disease? (e.g., heart attack, congestive heart failure)      yes 8. LUNG HISTORY: Do you have any history of lung disease?  (e.g., pulmonary embolus, asthma, emphysema)     no 9. PE RISK FACTORS: Do you have a history of blood clots? (or: recent major surgery, recent prolonged travel,  bedridden)     no 10. OTHER SYMPTOMS: Do you have any other symptoms? (e.g., runny nose, wheezing, chest pain)       Weak 11. PREGNANCY: Is there any chance you are pregnant? When was your last menstrual period?       no 12. TRAVEL: Have you traveled out of the country in the last month? (e.g., travel history, exposures)       no  Protocols used: Cough - Acute Productive-A-AH

## 2023-08-15 NOTE — Patient Instructions (Addendum)
 Thank you, [name], for allowing us  to be part of your care today. We discussed your current cough, which started after a recent flu-like illness. Based on your symptoms and medical history, this appears to be related to reactive airway inflammation, likely triggered by the virus, along with post-nasal drip contributing to your cough.  To help manage your symptoms, we've started you on Flonase  nasal spray to reduce nasal congestion and post-nasal drainage. You were also given dextromethorphan  syrup to help reduce coughing at night and improve your sleep. In addition, since you have a history of asthma, you can use your albuterol  inhaler as needed during flare-ups.  It was a pleasure seeing you today. Please don't hesitate to reach out if you have any questions or concerns.  I have ordered the following labs for you:  Lab Orders  No laboratory test(s) ordered today     Referrals ordered today:   Referral Orders  No referral(s) requested today     I have ordered the following medication/changed the following medications:   Stop the following medications: Medications Discontinued During This Encounter  Medication Reason   fluticasone  (FLONASE ) 50 MCG/ACT nasal spray Reorder     Start the following medications: Meds ordered this encounter  Medications   Dextromethorphan  HBr 15 MG/15ML LIQD    Sig: Take 10 mLs (10 mg total) by mouth in the morning, at noon, and at bedtime.    Dispense:  354 mL    Refill:  1   fluticasone  (FLONASE ) 50 MCG/ACT nasal spray    Sig: Place 1-2 sprays into both nostrils daily as needed for allergies or rhinitis.    Dispense:  10 mL    Refill:  1   budesonide -formoterol  (SYMBICORT ) 160-4.5 MCG/ACT inhaler    Sig: Inhale 1 puff into the lungs 2 (two) times daily as needed.    Dispense:  1 each    Refill:  12     Follow up: PRN   Remember:   Should you have any questions or concerns please call the internal medicine clinic at  [phone].    Armando Rossetti, M.D [facility]

## 2023-08-15 NOTE — Progress Notes (Signed)
 CC: cough  HPI:  Sandra [name] is a 58 y.o. female living with a history stated below and presents today for cough . Please see problem based assessment and plan for additional details.  Past Medical History:  Diagnosis Date   Acute conjunctivitis, right eye 01/31/2022   Acute pain of left shoulder 06/07/2017   Adenomyosis 06/03/2012   Asthma    as a child   Asthma 11/07/2005   Reported Diagnosis in 2012, no formal spirometry    Cardiomyopathy (HCC)    likely nonischemic DCM per Dr. 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 ASCVD risk of 14.5%    LBBB (left bundle branch block)    Left sided numbness 10/15/2019   Lipoma 02/23/2020   Menorrhagia    Migraine    Normal CT head (08/06/2006)   Migraine without aura 09/02/2006   Fairly Well-controlled  2 per month  Takes Elitriptan    Migraine without aura 09/02/2006   Fairly Well-controlled  2 per month  Takes Elitriptan      Nonischemic  dilated cardiomyopathy (HCC) 03/20/2017   Perimenopausal menorrhagia 02/20/2012   Perimenopausal menorrhagia 02/20/2012   Psoriasis 04/29/2013   Right foot strain 05/15/2021   Right low back pain 03/02/2014   Routine adult health maintenance 09/27/2010   Swelling of abdominal wall 03/02/2014   Swelling of abdominal wall 03/02/2014   Symptoms of upper respiratory infection (URI) 04/01/2018   Tinnitus 06/01/2020   Viral illness 10/08/2013   Viral URI with cough 01/13/2013    Current Outpatient Medications on File Prior to Visit  Medication Sig Dispense Refill   aspirin  EC 81 MG tablet Take 1 tablet (81 mg total) by mouth daily. Swallow whole. 30 tablet 9   atorvastatin  (LIPITOR ) 80 MG tablet Take 1 tablet by mouth once daily 90 tablet 0   Continuous Glucose Sensor (DEXCOM G7 SENSOR) MISC Place new sensor every 10 days. Use to monitor blood sugar continuously. 9 each 3   Dulaglutide  (TRULICITY ) 4.5 MG/0.5ML SOAJ INJECT 4 & 1/2 (FOUR & ONE-HALF) MG  ONCE A WEEK 4 mL 2   empagliflozin  (JARDIANCE ) 10 MG TABS tablet Take 1 tablet (10 mg total) by mouth daily before breakfast. 90 tablet 3   escitalopram  (LEXAPRO ) 10 MG tablet Take 1 tablet by mouth once daily 90 tablet 0   hydrALAZINE  (APRESOLINE ) 25 MG tablet Take 1 tablet (25 mg total) by mouth 3 (three) times daily. 270 tablet 3   Insulin  Glargine (BASAGLAR  KWIKPEN) 100 UNIT/ML Inject 26 Units into the skin at bedtime. 15 mL 3   metFORMIN  (GLUCOPHAGE ) 1000 MG tablet Take 1 tablet (1,000 mg total) by mouth 2 (two)  times daily with a meal. 180 tablet 3   metoprolol  (TOPROL -XL) 200 MG 24 hr tablet Take 1 tablet (200 mg total) by mouth daily. 90 tablet 3   sacubitril -valsartan  (ENTRESTO ) 97-103 MG Take 1 tablet by mouth 2 (two) times daily. 180 tablet 3   spironolactone  (ALDACTONE ) 25 MG tablet Take 1 tablet (25 mg total) by mouth daily. 90 tablet 3   No current facility-administered medications on file prior to visit.    Family History   Problem Relation Age of Onset   Hypertension Father    Diabetes Father     Social History   Socioeconomic History   Marital status: Married    Spouse name: Not on file   Number of children: Not on file   Years of education: Not on file   Highest education level: Not on file  Occupational History   Occupation: Building surveyor  Tobacco Use   Smoking status: Never   Smokeless tobacco: Never  Vaping Use   Vaping status: Never Used  Substance and Sexual Activity   Alcohol use: Yes    Alcohol/week: 0.0 standard drinks of alcohol    Comment: occ   Drug use: No   Sexual activity: Yes    Birth control/protection: Surgical  Other Topics Concern   Not on file  Social History Narrative   Studying at [employer] in early childhood education.   Married.   Social Drivers of Corporate investment banker Strain: Low Risk  (01/31/2022)   Overall Financial Resource Strain (CARDIA)    Difficulty of Paying Living Expenses: Not hard at all  Food Insecurity: No Food Insecurity (03/27/2023)   Hunger Vital Sign    Worried About Running Out of Food in the Last Year: Never true    Ran Out of Food in the Last Year: Never true  Transportation Needs: No Transportation Needs (03/27/2023)   PRAPARE - Administrator, Civil Service (Medical): No    Lack of Transportation (Non-Medical): No  Physical Activity: Not on file  Stress: No Stress Concern Present (01/31/2022)   [employer] of Occupational Health - Occupational Stress Questionnaire    Feeling of Stress : Not at all  Social Connections: Moderately Integrated (03/27/2023)   Social Connection and Isolation Panel    Frequency of Communication with Friends and Family: More than three times a week    Frequency of Social Gatherings with Friends and Family: More than three times a week    Attends Religious Services: More than 4 times per year    Active Member of [employer] or Organizations: No    Attends Banker Meetings: Never     Marital Status: Married  Catering manager Violence: Not At Risk (03/27/2023)   Humiliation, Afraid, Rape, and Kick questionnaire    Fear of Current or Ex-Partner: No    Emotionally Abused: No    Physically Abused: No    Sexually Abused: No    Review of Systems: ROS negative except for what is noted on the assessment and plan.  Vitals:   08/15/23 1539 08/15/23 1557  BP: (!) 147/88 (!) 156/92  Pulse: 91 83  Temp: 99.2 F (37.3 C)   TempSrc: Oral   SpO2: 94%   Weight: 193 lb 9.6 oz (87.8 kg)   Height: 5' (1.524 m)     Physical Exam  Physical Exam: Constitutional: well-appearing in no acute distress HENT: normocephalic atraumatic, mucous membranes moist Eyes: conjunctiva non-erythematous Neck: supple Cardiovascular: regular rate and rhythm, no  m/r/g Pulmonary/Chest: normal work of breathing on room air, lungs clear to auscultation bilaterally Abdominal: soft, non-tender, non-distended Neurological: alert & oriented x 3  Assessment & Plan:   Post-viral cough syndrome She presents with cough that started 5 days ago after flu-like symptoms. Initially productive, now more dry. Denies fever but reports nasal congestion.   Lung exam: clear bilaterally, no wheezes or crackles. No jugular venous distention. Patient appears euvolemic with no peripheral swelling. Vital signs stable. No fever.  Assessment: PND and reactive airway disease likely secondary to recent viral illness  Plan: Dextromethorphan  syrup for cough relief Flonase  spray for congestion Symbicort  inhaler as needed for asthma flare-ups     Patient seen with Dr. Francesco Armando Rossetti M.D [facility] Internal [facility], PGY-1 Phone: [phone] Date 08/15/2023 Time 5:20 PM

## 2023-08-16 NOTE — Progress Notes (Signed)
 Internal Medicine Clinic Attending  I was physically present during the key portions of the resident provided service and participated in the medical decision making of patient's management care. I reviewed pertinent patient test results.  The assessment, diagnosis, and plan were formulated together and I agree with the documentation in the resident's note.  Cherylene Corrente, MD

## 2023-08-26 ENCOUNTER — Other Ambulatory Visit: Payer: Self-pay

## 2023-08-26 DIAGNOSIS — E1169 Type 2 diabetes mellitus with other specified complication: Secondary | ICD-10-CM

## 2023-08-28 MED ORDER — TRULICITY 4.5 MG/0.5ML ~~LOC~~ SOAJ: 4.5000 mg | SUBCUTANEOUS | 2 refills | Status: DC

## 2023-09-17 ENCOUNTER — Other Ambulatory Visit: Payer: Self-pay

## 2023-09-17 DIAGNOSIS — F411 Generalized anxiety disorder: Secondary | ICD-10-CM

## 2023-09-18 ENCOUNTER — Other Ambulatory Visit: Payer: Self-pay | Admitting: *Deleted

## 2023-09-18 DIAGNOSIS — E785 Hyperlipidemia, unspecified: Secondary | ICD-10-CM

## 2023-09-18 MED ORDER — ATORVASTATIN CALCIUM 80 MG PO TABS: 80.0000 mg | ORAL_TABLET | Freq: Every day | ORAL | 0 refills | Status: DC

## 2023-09-25 ENCOUNTER — Ambulatory Visit
Admission: RE | Admit: 2023-09-25 | Discharge: 2023-09-25 | Disposition: A | Discharge status: Home / Self Care | Payer: Worker's Compensation | Source: Ambulatory Visit | Attending: Family Medicine | Admitting: Family Medicine

## 2023-09-25 ENCOUNTER — Other Ambulatory Visit: Payer: Self-pay | Admitting: Family Medicine

## 2023-09-25 DIAGNOSIS — W19XXXA Unspecified fall, initial encounter: Secondary | ICD-10-CM

## 2023-09-26 ENCOUNTER — Encounter: Payer: Self-pay | Admitting: *Deleted

## 2023-09-27 ENCOUNTER — Other Ambulatory Visit: Payer: Self-pay

## 2023-09-27 DIAGNOSIS — F411 Generalized anxiety disorder: Secondary | ICD-10-CM

## 2023-09-28 MED ORDER — ESCITALOPRAM OXALATE 10 MG PO TABS: 10.0000 mg | ORAL_TABLET | Freq: Every day | ORAL | 0 refills | Status: AC

## 2023-09-28 MED ORDER — ESCITALOPRAM OXALATE 10 MG PO TABS: 10.0000 mg | ORAL_TABLET | Freq: Every day | ORAL | 0 refills | Status: DC

## 2023-09-30 ENCOUNTER — Telehealth: Payer: Self-pay | Admitting: Licensed Clinical Social Worker

## 2023-09-30 ENCOUNTER — Ambulatory Visit: Payer: Self-pay | Attending: Physician Assistant | Admitting: Physician Assistant

## 2023-09-30 ENCOUNTER — Encounter: Payer: Self-pay | Admitting: Physician Assistant

## 2023-09-30 VITALS — BP 132/70 | HR 99 | Resp 16 | Ht 60.0 in | Wt 191.8 lb

## 2023-09-30 DIAGNOSIS — I10 Essential (primary) hypertension: Secondary | ICD-10-CM | POA: Diagnosis not present

## 2023-09-30 DIAGNOSIS — I5022 Chronic systolic (congestive) heart failure: Secondary | ICD-10-CM

## 2023-09-30 NOTE — Telephone Encounter (Signed)
 H&V Care Navigation CSW Progress Note  Clinical Social Worker completed chart review as pt noted as self pay when arrived to check on any benefits. Note that pt told check in team that she would call back with her BCBS benefits. Note they are pulling in [place], pt may need to use copay cards to lower costs for medications since commercially insured. Remain available as needed.  Patient is participating in a Managed Medicaid Plan:  No, BCBS commercial plan  SDOH Screenings   Food Insecurity: No Food Insecurity (03/27/2023)  Housing: Low Risk  (03/27/2023)  Transportation Needs: No Transportation Needs (03/27/2023)  Utilities: Not At Risk (03/27/2023)  Depression (PHQ2-9): Low Risk  (08/15/2023)  Financial Resource Strain: Low Risk  (01/31/2022)  Social Connections: Moderately Integrated (03/27/2023)  Stress: No Stress Concern Present (01/31/2022)  Tobacco Use: Low Risk  (09/30/2023)  Health Literacy: Adequate Health Literacy (08/15/2023)   Marit Lark, MSW, LCSW Clinical Social Worker II [facility] Heart/Vascular Care Navigation  [phone]- work cell phone (preferred)

## 2023-09-30 NOTE — Assessment & Plan Note (Addendum)
 Nonischemic cardiomyopathy.  NYHA II-IIb.  Volume status stable.  EF previously 40-45.  Most recent echo demonstrated worsening LV function with EF 35-40.  She is overall stable. Volume status stable. NYHA II-IIb. Her BP is now much better controlled. Unfortunately, she is off SGLT2i due to cost. -Continue Metoprolol  succinate 200 mg once daily, Entrestor 97/103 mg twice daily, Spironolactone  25 mg once daily, Hydralazine  25 mg three times a day  -I will check with our PharmD to see if we can get her assistance for Jardiance /Farxiga  -Arrange limited TTE to recheck EF -Follow up Dr. Delford in 6 mos

## 2023-09-30 NOTE — Assessment & Plan Note (Signed)
 The patient's blood pressure is controlled on her current regimen.  Continue current therapy with Hydralazine  25 mg three times a day, Entresto  97/103 mg twice daily, spironolactone  25 mg once daily, Metoprolol  succinate 200 mg once daily.

## 2023-09-30 NOTE — Patient Instructions (Signed)
 Medication Instructions:  No medication changes were made during today's visit.  *If you need a refill on your cardiac medications before your next appointment, please call your pharmacy*   Lab Work: No labs were ordered during today's visit.  If you have labs (blood work) drawn today and your tests are completely normal, you will receive your results only by: MyChart Message (if you have MyChart) OR A paper copy in the mail If you have any lab test that is abnormal or we need to change your treatment, we will call you to review the results.   Testing/Procedures: Your physician has requested that you have an echocardiogram. Echocardiography is a painless test that uses sound waves to create images of your heart. It provides your doctor with information about the size and shape of your heart and how well your heart's chambers and valves are working. This procedure takes approximately one hour. There are no restrictions for this procedure. Please do NOT wear cologne, perfume, aftershave, or lotions (deodorant is allowed). Please arrive 15 minutes prior to your appointment time.  Please note: We ask at that you not bring children with you during ultrasound (echo/ vascular) testing. Due to room size and safety concerns, children are not allowed in the ultrasound rooms during exams. Our front office staff cannot provide observation of children in our lobby area while testing is being conducted. An adult accompanying a patient to their appointment will only be allowed in the ultrasound room at the discretion of the ultrasound technician under special circumstances. We apologize for any inconvenience.   Your next appointment:   6 month(s)   Provider:   Maude Emmer, MD     Follow-Up: At [facility], you and your health needs are our priority.  As part of our continuing mission to provide you with exceptional heart care, we have created designated Provider Care Teams.  These Care Teams  include your primary Cardiologist (physician) and Advanced Practice Providers (APPs -  Physician Assistants and Nurse Practitioners) who all work together to provide you with the care you need, when you need it. We recommend signing up for the patient portal called MyChart.  Sign up information is provided on this After Visit Summary.  MyChart is used to connect with patients for Virtual Visits (Telemedicine).  Patients are able to view lab/test results, encounter notes, upcoming appointments, etc.  Non-urgent messages can be sent to your provider as well.   To learn more about what you can do with MyChart, go to [email or web address].

## 2023-09-30 NOTE — Progress Notes (Signed)
 OFFICE NOTE:    Date:  09/30/2023  ID:  Sandra [name], DOB 01-Mar-1965, MRN [id number] PCP: [name], MD  [facility] Providers Cardiologist:  Maude Emmer, MD        HFimpEF (heart failure with improved ejection fraction)  Non-ischemic cardiomyopathy Cardiac MRI 03/10/2012: EF 46 no LGE TTE 05/09/2017: EF 40-45 Stress MPI 05/20/2017 no ischemia, EF 38 TTE 11/28/2018: EF 30-35 TTE 10/22/2019: EF 40 TTE 03/22/2021: EF 50-55, no RWMA, normal RVSF, trivial MR, AV sclerosis TTE 05/15/2023: EF 35-40, global HK, normal RVSF, mild LAE, trivial MR  Diabetes mellitus  Hypertension  Hyperlipidemia  Left Bundle Branch Block  Asthma       Discussed the use of AI scribe software for clinical note transcription with the patient, who gave verbal consent to proceed. History of Present Illness Sandra [name] is a 58 y.o. female who returns for follow up of CHF. EF had previously improved but was decreased again during admission for acute CHF in 04/2023 (35-40). GDMT was adjusted. Last seen in 06/2023. BP was above target and Hydralazine  was added to her medical regimen.   She previously took Jardiance  but discontinued it due to cost.  She has not had chest pain, significant shortness of breath, syncope, or orthopnea. She does not smoke.    ROS-See HPI    Studies Reviewed:              Physical Exam:  VS:  BP 132/70 (BP Location: Left Arm, Patient Position: Sitting, Cuff Size: Large)   Pulse 99   Resp 16   Ht 5' (1.524 m)   Wt 191 lb 12.8 oz (87 kg)   SpO2 95%   PF 95 L/min   BMI 37.46 kg/m        Wt Readings from Last 3 Encounters:  09/30/23 191 lb 12.8 oz (87 kg)  08/15/23 193 lb 9.6 oz (87.8 kg)  07/03/23 189 lb (85.7 kg)    Constitutional:      Appearance: Healthy appearance. Not in distress.  Neck:     Vascular: JVD normal.  Pulmonary:     Breath sounds: Normal breath sounds. No wheezing. No rales.  Cardiovascular:     Normal rate. Regular rhythm.      Murmurs: There is no murmur.  Edema:    Peripheral edema absent.  Abdominal:     Palpations: Abdomen is soft.       Assessment and Plan:    Assessment & Plan Chronic HFrEF (heart failure with reduced ejection fraction) (HCC) Nonischemic cardiomyopathy.  NYHA II-IIb.  Volume status stable.  EF previously 40-45.  Most recent echo demonstrated worsening LV function with EF 35-40.  She is overall stable. Volume status stable. NYHA II-IIb. Her BP is now much better controlled. Unfortunately, she is off SGLT2i due to cost. -Continue Metoprolol  succinate 200 mg once daily, Entrestor 97/103 mg twice daily, Spironolactone  25 mg once daily, Hydralazine  25 mg three times a day  -I Sandra check with our PharmD to see if we can get her assistance for Jardiance /Farxiga  -Arrange limited TTE to recheck EF -Follow up Dr. Emmer in 6 mos Essential hypertension The patient's blood pressure is controlled on her current regimen.  Continue current therapy with Hydralazine  25 mg three times a day, Entresto  97/103 mg twice daily, spironolactone  25 mg once daily, Metoprolol  succinate 200 mg once daily.         Dispo:  Return in about 6 months (around 03/29/2024) for Routine  Follow Up, w/ Dr. Delford.  Signed, Sandra Ferrier, PA-C

## 2023-10-01 ENCOUNTER — Other Ambulatory Visit (HOSPITAL_COMMUNITY): Payer: Self-pay

## 2023-10-01 MED ORDER — DAPAGLIFLOZIN PROPANEDIOL 10 MG PO TABS: 10.0000 mg | ORAL_TABLET | Freq: Every day | ORAL | 3 refills | Status: AC

## 2023-10-01 NOTE — Telephone Encounter (Signed)
 Thank you! Rx sent to [facility] for Farxiga . [name], PA-C    10/01/2023 5:44 PM

## 2023-10-01 NOTE — Telephone Encounter (Signed)
 Entresto  for 90 days $20   Jardiance  for 90 days $334.99Farxiga  for 90 days $100  Plan is commercial so not eligable for PAP applications. Must utilize copay cards.

## 2023-10-01 NOTE — Addendum Note (Signed)
 Addended byBETHA FERRIER, GLENDIA T on: 10/01/2023 05:44 PM   Modules accepted: Orders

## 2023-10-02 NOTE — Telephone Encounter (Signed)
 H&V Care Navigation CSW Progress Note  Clinical Social Worker contacted patient by phone to f/u on medication assistance update.  I was able to successfully call [employer] and apply savings coupon for co-pay for Farxiga .  Co-pay at this time is $0.  Co-Pay Coupon sent to pt. The information is as follows: BIN#- [id number] PCN# [id number] GRP# [id number] ID# [id number]  Patient is participating in a Managed Medicaid Plan:  No, BCBS commercial  SDOH Screenings   Food Insecurity: No Food Insecurity (03/27/2023)  Housing: Low Risk  (03/27/2023)  Transportation Needs: No Transportation Needs (03/27/2023)  Utilities: Not At Risk (03/27/2023)  Depression (PHQ2-9): Low Risk  (08/15/2023)  Financial Resource Strain: Low Risk  (01/31/2022)  Social Connections: Moderately Integrated (03/27/2023)  Stress: No Stress Concern Present (01/31/2022)  Tobacco Use: Low Risk  (09/30/2023)  Health Literacy: Adequate Health Literacy (08/15/2023)    Marit Lark, MSW, LCSW Clinical Social Worker II [facility] Health Heart/Vascular Care Navigation  [phone]- work cell phone (preferred)

## 2023-10-09 ENCOUNTER — Ambulatory Visit: Payer: Self-pay | Admitting: Orthopaedic Surgery

## 2023-10-15 ENCOUNTER — Other Ambulatory Visit: Payer: Self-pay | Admitting: Cardiovascular Disease

## 2023-10-15 DIAGNOSIS — I5022 Chronic systolic (congestive) heart failure: Secondary | ICD-10-CM

## 2023-10-16 ENCOUNTER — Ambulatory Visit: Payer: Self-pay | Admitting: Orthopaedic Surgery

## 2023-10-16 ENCOUNTER — Telehealth: Payer: Self-pay

## 2023-10-16 ENCOUNTER — Telehealth: Payer: Self-pay | Admitting: Orthopaedic Surgery

## 2023-10-16 ENCOUNTER — Telehealth: Payer: Self-pay | Admitting: Cardiovascular Disease

## 2023-10-16 DIAGNOSIS — E1169 Type 2 diabetes mellitus with other specified complication: Secondary | ICD-10-CM

## 2023-10-16 DIAGNOSIS — M1711 Unilateral primary osteoarthritis, right knee: Secondary | ICD-10-CM | POA: Diagnosis not present

## 2023-10-16 NOTE — Telephone Encounter (Signed)
 Patient given surgical clearance forms for cardiology and PCP. Aware that we must receive clearance forms back before being able to proceed with scheduling surgery.

## 2023-10-16 NOTE — Telephone Encounter (Signed)
 Faxed over a pre-op clearance form from [facility].  Thank you.

## 2023-10-16 NOTE — Telephone Encounter (Signed)
 Pending surgery to be scheduled. Will complete when have date for surgery.

## 2023-10-16 NOTE — Progress Notes (Signed)
 Office Visit Note   Patient: Sandra [name]           Date of Birth: 05/10/1965           MRN: [id number] Visit Date: 10/16/2023              Requested by: [name], MD [address] [place],  [place] [address] PCP: [name], MD   Assessment & Plan: Visit Diagnoses:  1. Primary osteoarthritis of right knee   2. Type 2 diabetes mellitus with other specified complication, without long-term current use of insulin  (HCC)     Plan: History of Present Illness Sandra [name] is a 58 year old female who follows up for severe right knee pain from OA.    I saw her earlier this year for this.  Steroid injection performed but provided minimal relief.  Continues to have severe pain that affects almost all ADLs.    Her A1c was 7.7 three months ago, which is at the threshold for surgical clearance. She is due for an A1c recheck today.  She occasionally consumes alcohol and denies smoking.  Results LABS   A1c: 7.7% (07/16/2023)  XRAYS: right knee shows severe OA, kellgren lawrence stage 4.    Right knee: pain and crepitus with manipulation.  No joint effusion.  Collaterals are stable.  Assessment and Plan Severe right knee osteoarthritis Chronic knee osteoarthritis with significant pain and functional impairment. Previous cortisone injection provided minimal relief. Surgery delayed due to clearance issues. Current A1c is 7.7, the cutoff for surgical clearance. - Recheck A1c today to ensure it remains at or below 7.7 for surgical clearance. - Provide paperwork for cardiologist to facilitate surgical clearance. - Coordinate with surgery scheduler to arrange knee replacement surgery. - Ensure primary care physician clearance is obtained. - detailed surgical plan discussed  Impression is severe right knee degenerative joint disease secondary to Osteoarthritis.  Patient has attempted conservative treatment for at least 6 consecutive weeks within the past 12  weeks, including but not limited to physical therapy, home exercise program, NSAIDs, activity modification, and/or corticosteroid injections. Despite these efforts, symptoms have not improved or have worsened. Conservative measures have been deemed unsuccessful at this time. After a detailed discussion covering diagnosis and treatment options--including the risks, benefits, alternatives, and potential complications of surgical and nonsurgical management--the patient elected to proceed with surgery  Anticoagulants: No antithrombotic Postop anticoagulation: Eliquis Diabetic: Yes  Nickel allergy: No Prior DVT/PE: No Tobacco use: No Clearances needed for surgery: PCP, cardiology Anticipated discharge dispo: Home   Follow-Up Instructions: No follow-ups on file.   Orders:  Orders Placed This Encounter  Procedures   Hemoglobin A1c   No orders of the defined types were placed in this encounter.     Procedures: No procedures performed   Clinical Data: No additional findings.   Subjective: Chief Complaint  Patient presents with   Right Knee - Pain    HPI  Review of Systems  Constitutional: Negative.   HENT: Negative.    Eyes: Negative.   Respiratory: Negative.    Cardiovascular: Negative.   Endocrine: Negative.   Musculoskeletal: Negative.   Neurological: Negative.   Hematological: Negative.   Psychiatric/Behavioral: Negative.    All other systems reviewed and are negative.    Objective: Vital Signs: There were no vitals taken for this visit.  Physical Exam Vitals and nursing note reviewed.  Constitutional:      Appearance: She is well-developed.  HENT:  Head: Atraumatic.     Nose: Nose normal.  Eyes:     Extraocular Movements: Extraocular movements intact.  Cardiovascular:     Pulses: Normal pulses.  Pulmonary:     Effort: Pulmonary effort is normal.  Abdominal:     Palpations: Abdomen is soft.  Musculoskeletal:     Cervical back: Neck supple.   Skin:    General: Skin is warm.     Capillary Refill: Capillary refill takes less than 2 seconds.  Neurological:     Mental Status: She is alert. Mental status is at baseline.  Psychiatric:        Behavior: Behavior normal.        Thought Content: Thought content normal.        Judgment: Judgment normal.     Ortho Exam  Specialty Comments:  No specialty comments available.  Imaging: No results found.   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   Influenza 02/23/2016   Psoriasis 04/29/2013   Morbid obesity (HCC) 05/16/2012   Chronic HFrEF (heart failure with reduced ejection fraction) (HCC) 02/22/2012   LBBB (left bundle branch block) 01/24/2012   Routine adult health maintenance 09/27/2010   Type 2 diabetes mellitus with other specified complication (HCC) 11/17/2009   Hyperlipidemia 08/20/2007   Essential hypertension 07/08/2006   Asthma 11/07/2005   Past Medical History:  Diagnosis Date   Acute conjunctivitis, right eye 01/31/2022   Acute pain of left shoulder 06/07/2017   Adenomyosis 06/03/2012   Asthma    as a child   Asthma 11/07/2005   Reported Diagnosis in 2012, no formal spirometry    Cardiomyopathy (HCC)    likely nonischemic DCM per Dr. 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   Perimenopausal menorrhagia 02/20/2012   Perimenopausal menorrhagia 02/20/2012  Psoriasis 04/29/2013   Right foot strain 05/15/2021   Right low back pain 03/02/2014   Routine adult health maintenance 09/27/2010   Swelling of abdominal wall 03/02/2014   Swelling of abdominal wall 03/02/2014   Symptoms of upper respiratory infection (URI) 04/01/2018   Tinnitus 06/01/2020   Viral illness 10/08/2013   Viral URI with cough 01/13/2013    Family History  Problem Relation Age of Onset   Hypertension Father    Diabetes Father     Past Surgical History:  Procedure Laterality Date   CHOLECYSTECTOMY  2009   DILATATION & CURETTAGE/HYSTEROSCOPY WITH MYOSURE N/A 12/25/2017   Procedure: DILATATION & CURETTAGE/HYSTEROSCOPY   POLYPECTOMY WITH MYOSURE;  Surgeon: Linnell Devere BRAVO, MD;  Location: [facility];  Service: Gynecology;  Laterality: N/A;   TUBAL LIGATION     Social History   Occupational History   Occupation: Building surveyor  Tobacco Use   Smoking status: Never   Smokeless tobacco: Never  Vaping Use   Vaping status: Never Used  Substance and Sexual Activity   Alcohol use: Yes    Alcohol/week: 0.0 standard drinks of alcohol    Comment: occ   Drug use: No   Sexual activity: Yes    Birth control/protection: Surgical

## 2023-10-16 NOTE — Telephone Encounter (Signed)
   Pre-operative Risk Assessment    Patient Name: Sandra [name]  DOB: May 12, 1965 MRN: [id number]   Date of last office visit: 09/30/23 [name], PA-C Date of next office visit: NONE   Request for Surgical Clearance    Procedure:  RIGHT TOTAL KNEE ARTHOPLASTY  Date of Surgery:  Clearance TBD                                Surgeon:  KAY OZELL CUMMINS, MD Surgeon's Group or Practice Name:  [facility] Phone number:  [phone] Fax number:  [phone]   Type of Clearance Requested:   - Medical  - Pharmacy:  Hold Aspirin      Type of Anesthesia:  Not Indicated   Additional requests/questions:    SignedLucie DELENA Ku   10/16/2023, 11:55 AM

## 2023-10-16 NOTE — Telephone Encounter (Signed)
 Pt submitted medical release forms, FMLA forms, and $20 payment

## 2023-10-16 NOTE — Telephone Encounter (Signed)
 She has a follow up echocardiogram pending on 10/17. I would rather wait until those results are back. She had normalization of EF and then it went down again. So I think we should follow up on that first and then let her have her surgery if everything looks ok. Thanks [employer], [place]    10/16/2023 5:01 PM

## 2023-10-16 NOTE — Telephone Encounter (Signed)
 Once we get preop clearance we will put it into the system and go from there still have not received anything

## 2023-10-16 NOTE — Telephone Encounter (Signed)
 Sandra [name]  You saw this patient on 09/30/2023. Per protocol we request that you comment on his cardiac risk to proceed with  RIGHT TOTAL KNEE ARTHOPLASTY  since it has been less than 2 months since evaluated in the office. Please send your comment to P CV Pre-Op Pool.  Thank you, [name] DNP, ANP, AACC.

## 2023-10-17 LAB — HEMOGLOBIN A1C
Hgb A1c MFr Bld: 7.2 % — ABNORMAL HIGH (ref <5.7)
Mean Plasma Glucose: 160 mg/dL
eAG (mmol/L): 8.9 mmol/L

## 2023-10-17 NOTE — Telephone Encounter (Signed)
 Call [facility] to update them on status. Left message on VM for Amy Bishop (referral coordinator) at [phone] that we are waiting on echocardiogram scheduled for 11/08/2023.

## 2023-10-17 NOTE — Telephone Encounter (Signed)
 See notes from [name], [facility] pt has an echo on 11/08/23 to be done before being cleared.      10/16/2023 - Patient Calls: Germaine Bickers and Dorina Base, CMA (Newest Message First)            View All Conversations on this Encounter Me    10/17/23 10:11 AM Note I reviewed the chart and the surgery clearance is in the chart already, see notes 10/16/23 Sheppard Ku, CMA    Dorina Base, CMA to Cv Div Preop Callback (Selected Message)     10/17/23  8:50 AM Note Call [facility] to update them on status. Left message on VM for Amy Bishop (referral coordinator) at [phone] that we are waiting on echocardiogram scheduled for 11/08/2023.       Nieto, Saraii, [facility]    10/16/23 11:31 AM Note Once we get preop clearance we will put it into the system and go from there still have not received anything        10/16/23 11:02 AM Reynolds, Kurtis routed this conversation to [employer], Kurtis KR   10/16/23 11:02 AM Note Faxed over a pre-op clearance form from [facility].  Thank you.

## 2023-10-17 NOTE — Telephone Encounter (Signed)
 I reviewed the chart and the surgery clearance is in the chart already, see notes 10/16/23 Sheppard Ku, CMA

## 2023-10-21 NOTE — Telephone Encounter (Signed)
 Patient requesting to be taken out of work. She is pending surgery. Please advise. Thank you!

## 2023-10-21 NOTE — Telephone Encounter (Signed)
 Sure that's fine.  She can be out of work until surgery.  Thanks.

## 2023-10-24 ENCOUNTER — Encounter: Payer: Self-pay | Admitting: Student

## 2023-10-24 ENCOUNTER — Ambulatory Visit: Admitting: Student

## 2023-10-24 VITALS — BP 143/66 | HR 83 | Temp 98.6°F | Ht 60.0 in | Wt 196.8 lb

## 2023-10-24 DIAGNOSIS — Z2821 Immunization not carried out because of patient refusal: Secondary | ICD-10-CM

## 2023-10-24 DIAGNOSIS — Z01818 Encounter for other preprocedural examination: Secondary | ICD-10-CM

## 2023-10-24 DIAGNOSIS — Z7985 Long-term (current) use of injectable non-insulin antidiabetic drugs: Secondary | ICD-10-CM

## 2023-10-24 DIAGNOSIS — Z794 Long term (current) use of insulin: Secondary | ICD-10-CM

## 2023-10-24 DIAGNOSIS — E1169 Type 2 diabetes mellitus with other specified complication: Secondary | ICD-10-CM | POA: Diagnosis not present

## 2023-10-24 DIAGNOSIS — Z7984 Long term (current) use of oral hypoglycemic drugs: Secondary | ICD-10-CM

## 2023-10-24 DIAGNOSIS — I10 Essential (primary) hypertension: Secondary | ICD-10-CM

## 2023-10-24 DIAGNOSIS — Z79899 Other long term (current) drug therapy: Secondary | ICD-10-CM

## 2023-10-24 DIAGNOSIS — Z8249 Family history of ischemic heart disease and other diseases of the circulatory system: Secondary | ICD-10-CM

## 2023-10-24 NOTE — Assessment & Plan Note (Signed)
 History of HTN and HFrEF.  BP improved to 143/66 today. GDMT: Entresto  97-103 mg twice daily, metoprolol  200 mg daily, spironolactone  25 mg, Farxiga  10 mg.  Also on hydralazine  25 mg 3 times daily.  Reports adherence to her medications.  Follows with cardiology.  Appears euvolemic with VSS today.  Last BMP 03/2023 with normal electrolytes and mild elevated Scr. Repeat echo planned on 10/17.  Plan -Continue above GDMT with hydralazine  -F/u with cardiology and cardiac clearance for future right total knee replacement -BMP at future visit

## 2023-10-24 NOTE — Patient Instructions (Signed)
 Thank you, [name] for allowing us  to provide your care today. Today we discussed:  -Blood pressure slightly elevated today. Monitor blood pressure at home if we need to make adjustments. -Will send preop clearance form to your orthopedics doctor.  -Blood work today to check cholesterol  -Urine today to check for proteins  -Bring your Dexcom device so we can review how your blood sugars are doing! -Contact us  if you notice low blood sugars < 70    Follow up: 2-3 months   Should you have any questions or concerns please call the internal medicine clinic at [phone].    Rayane Gallardo, D.O. [facility]

## 2023-10-24 NOTE — Assessment & Plan Note (Addendum)
 Status: Close to goal/improving. Last A1c of 7.2 in 09/2023 from 7.7.  A1c today is NOT DUE.  Currently taking Farxiga  10 mg, Trulicity  4.5 mg weekly, Basaglar  26 units daily, metformin  1 g twice daily.  Patient denies hypoglycemia.  She reports fasting glucose 130-140s.  Did not have Dexcom available to download report today.  Plan -Continue Farxiga , Trulicity , Metformin  and Basaglar  -Discussed bringing Dexcom to review report and possibly titrating down Basaglar  -A1c in 12/2023 -Ophthalmology exam: Referral sent 03/2023 -Urine ACR completed 10/2022, normalized, on ARNI and SGLT2, repeat today -LDL 81 on 02/2021, taking atorvastatin  80 mg, repeat lipid panel today

## 2023-10-24 NOTE — Progress Notes (Signed)
 CC: Preoperative medical clearance  HPI: [name] is a 58 y.o. female living with a history stated below and presents today for preoperative medical clearance. Please see problem based assessment and plan for additional details.  Past Medical History:  Diagnosis Date   Acute conjunctivitis, right eye 01/31/2022   Acute pain of left shoulder 06/07/2017   Adenomyosis 06/03/2012   Asthma    as a child   Asthma 11/07/2005   Reported Diagnosis in 2012, no formal spirometry    Cardiomyopathy (HCC)    likely nonischemic DCM per Dr. 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 Swallow whole. 30 tablet 9   atorvastatin  (LIPITOR ) 80 MG tablet Take 1 tablet (80 mg total) by mouth daily. 90 tablet 0   budesonide -formoterol  (SYMBICORT ) 160-4.5 MCG/ACT inhaler Inhale 1 puff into the lungs 2 (two) times daily as needed. 1 each 12   Continuous Glucose Sensor (DEXCOM G7 SENSOR) MISC Place new sensor every 10 days. Use to monitor blood sugar continuously. 9 each 3   dapagliflozin  propanediol (FARXIGA ) 10 MG TABS tablet Take 1 tablet (10 mg total) by mouth daily. 90 tablet 3   Dextromethorphan  HBr 15 MG/15ML LIQD Take 10 mLs (10 mg total) by mouth in the morning, at noon, and at bedtime. 354 mL 1   Dulaglutide  (TRULICITY ) 4.5 MG/0.5ML SOAJ Inject 4.5 mg into the skin once a week. 4 mL 2   escitalopram  (LEXAPRO ) 10 MG tablet Take 1 tablet (10 mg total) by mouth daily. 90 tablet 0   finasteride (PROSCAR) 5 MG tablet Take  2.5 mg by mouth daily.     fluticasone  (FLONASE ) 50 MCG/ACT nasal spray Place 1-2 sprays into both nostrils daily as needed for allergies or rhinitis. 10 mL 1   hydrALAZINE  (APRESOLINE ) 25 MG tablet Take 1 tablet (25 mg total) by mouth 3 (three) times daily. 270 tablet 3   Insulin  Glargine (BASAGLAR  KWIKPEN) 100 UNIT/ML Inject 26 Units into the skin at bedtime. 15 mL 3   metFORMIN   (GLUCOPHAGE ) 1000 MG tablet Take 1 tablet (1,000 mg total) by mouth 2 (two) times daily with a meal. 180 tablet 3   metoprolol  (TOPROL -XL) 200 MG 24 hr tablet Take 1 tablet (200 mg total) by mouth daily. 90 tablet 3   sacubitril -valsartan  (ENTRESTO ) 97-103 MG Take 1 tablet by mouth twice daily 180 tablet 0   spironolactone  (ALDACTONE ) 25 MG tablet Take 1 tablet (25 mg total) by mouth daily. 90 tablet 3   No current facility-administered medications on file prior to visit.    Family History  Problem Relation Age of Onset   Hypertension Father    Diabetes Father     Social History   Socioeconomic History   Marital status: Married    Spouse name: Not on file   Number of children: Not on file   Years of education: Not on file   Highest education level: Not on file  Occupational History   Occupation: Building surveyor  Tobacco Use   Smoking status: Never   Smokeless tobacco: Never  Vaping Use   Vaping status: Never Used  Substance and Sexual Activity   Alcohol use: Yes    Alcohol/week: 0.0 standard drinks of alcohol    Comment: occ   Drug use: No   Sexual activity: Yes    Birth control/protection: Surgical  Other Topics Concern   Not on file  Social History Narrative   Studying at [employer] in early childhood education.   Married.   Social Drivers of Corporate investment banker Strain: Low Risk  (01/31/2022)   Overall Financial Resource Strain (CARDIA)    Difficulty of Paying Living Expenses: Not hard at all  Food Insecurity: No Food Insecurity (03/27/2023)   Hunger Vital Sign    Worried About Running Out of Food in the Last Year: Never true    Ran Out of Food in the Last Year: Never true  Transportation Needs: No Transportation Needs (03/27/2023)   PRAPARE - Administrator, Civil Service (Medical): No    Lack of Transportation (Non-Medical): No  Physical Activity: Not on file  Stress: No Stress Concern Present (01/31/2022)   [employer] of Occupational Health  - Occupational Stress Questionnaire    Feeling of Stress : Not at all  Social Connections: Moderately Integrated (03/27/2023)   Social Connection and Isolation Panel    Frequency of Communication with Friends and Family: More than three times a week    Frequency of Social Gatherings with Friends and Family: More than three times a week    Attends Religious Services: More than 4 times per year    Active Member of [employer] or Organizations: No    Attends Banker Meetings: Never    Marital Status: Married  Catering manager Violence: Not At Risk (03/27/2023)   Humiliation, Afraid, Rape, and Kick questionnaire    Fear of Current or Ex-Partner: No    Emotionally Abused: No    Physically Abused: No    Sexually Abused: No    Review of Systems: ROS negative except for  what is noted on the assessment and plan.  Vitals:   10/24/23 0918 10/24/23 0942  BP: (!) 155/76 (!) 143/66  Pulse: 87 83  Temp: 98.6 F (37 C)   TempSrc: Oral   SpO2: 95%   Weight: 196 lb 12.8 oz (89.3 kg)   Height: 5' (1.524 m)    Physical Exam: Constitutional: well-appearing female sitting in chair, in no acute distress Cardiovascular: regular rate and rhythm Pulmonary/Chest: normal work of breathing on room air, lungs clear to auscultation bilaterally, no crackles or wheezing MSK: No LE edema Neurological: alert & oriented x 3 Skin: warm and dry  Assessment & Plan:   Assessment & Plan Preoperative clearance Presents for preop clearance. Plans w/ orthopedics for R knee arthroplasty, tentatively 12/23/23. Denies any acute concerns today. Denies chest pain, SOB, LE edema. RCRI: 2 (5% risk of major cardiac event). DASI: >6 METS  Patient is Intermediate risk at this time for the listed procedure. A1c improved to 7.2% in 09/2023. Certain medications will need to be held prior to procedure. Pending cardiology clearance with repeat echo.  -Hold: Aspirin  7 days prior to surgery  -Hold: Farxiga  3 days prior to  surgery  -Hold: Trulicity  1 week prior to surgery   Plan -Complete medical clearance form, will send/fax to orthopedics office -Patient to speak with cardiologist for cardiac clearance, pending repeat Echo later this month  -Provided list to patient of medications that will need to be held prior  Type 2 diabetes mellitus with other specified complication, without long-term current use of insulin  (HCC) Status: Close to goal/improving. Last A1c of 7.2 in 09/2023 from 7.7.  A1c today is NOT DUE.  Currently taking Farxiga  10 mg, Trulicity  4.5 mg weekly, Basaglar  26 units daily, metformin  1 g twice daily.  Patient denies hypoglycemia.  She reports fasting glucose 130-140s.  Did not have Dexcom available to download report today.  Plan -Continue Farxiga , Trulicity , Metformin  and Basaglar  -Discussed bringing Dexcom to review report and possibly titrating down Basaglar  -A1c in 12/2023 -Ophthalmology exam: Referral sent 03/2023 -Urine ACR completed 10/2022, normalized, on ARNI and SGLT2, repeat today -LDL 81 on 02/2021, taking atorvastatin  80 mg, repeat lipid panel today Essential hypertension History of HTN and HFrEF.  BP improved to 143/66 today. GDMT: Entresto  97-103 mg twice daily, metoprolol  200 mg daily, spironolactone  25 mg, Farxiga  10 mg.  Also on hydralazine  25 mg 3 times daily.  Reports adherence to her medications.  Follows with cardiology.  Appears euvolemic with VSS today.  Last BMP 03/2023 with normal electrolytes and mild elevated Scr. Repeat echo planned on 10/17.  Plan -Continue above GDMT with hydralazine  -F/u with cardiology and cardiac clearance for future right total knee replacement -BMP at future visit Immunization declined Declined PCV and Flu today.   Orders Placed This Encounter  Procedures   Lipid Profile   Microalbumin / Creatinine Urine Ratio   Return in about 3 months (around 01/24/2024) for diabetes.   Patient discussed with Dr. Jeanelle Ozell Nearing,  D.O. [facility], PGY-3 Phone: [phone] Date 10/24/2023 Time 4:19 PM

## 2023-10-25 ENCOUNTER — Ambulatory Visit: Payer: Self-pay | Admitting: Student

## 2023-10-25 LAB — LIPID PANEL
Chol/HDL Ratio: 3.3 ratio (ref 0.0–4.4)
Cholesterol, Total: 114 mg/dL (ref 100–199)
HDL: 35 mg/dL — ABNORMAL LOW (ref >39)
LDL Chol Calc (NIH): 67 mg/dL (ref 0–99)
Triglycerides: 49 mg/dL (ref 0–149)
VLDL Cholesterol Cal: 12 mg/dL (ref 5–40)

## 2023-10-25 LAB — MICROALBUMIN / CREATININE URINE RATIO
Creatinine, Urine: 177.5 mg/dL
Microalb/Creat Ratio: 132 mg/g{creat} — ABNORMAL HIGH (ref 0–29)
Microalbumin, Urine: 234.4 ug/mL

## 2023-10-28 NOTE — Progress Notes (Signed)
 Internal Medicine Clinic Attending  Case discussed with the resident at the time of the visit.  We reviewed the resident's history and exam and pertinent patient test results.  I agree with the assessment, diagnosis, and plan of care documented in the resident's note.

## 2023-11-08 ENCOUNTER — Ambulatory Visit (HOSPITAL_COMMUNITY): Payer: Self-pay

## 2023-11-14 ENCOUNTER — Ambulatory Visit (HOSPITAL_COMMUNITY)
Admission: RE | Admit: 2023-11-14 | Discharge: 2023-11-14 | Disposition: A | Discharge status: Home / Self Care | Source: Ambulatory Visit | Attending: Physician Assistant | Admitting: Physician Assistant

## 2023-11-14 DIAGNOSIS — I428 Other cardiomyopathies: Secondary | ICD-10-CM | POA: Diagnosis not present

## 2023-11-14 DIAGNOSIS — I110 Hypertensive heart disease with heart failure: Secondary | ICD-10-CM | POA: Diagnosis not present

## 2023-11-14 DIAGNOSIS — Z006 Encounter for examination for normal comparison and control in clinical research program: Secondary | ICD-10-CM

## 2023-11-14 DIAGNOSIS — I5022 Chronic systolic (congestive) heart failure: Secondary | ICD-10-CM | POA: Diagnosis not present

## 2023-11-14 LAB — ECHOCARDIOGRAM LIMITED
Area-P 1/2: 6.71 cm2
Calc EF: 48.4 %
S' Lateral: 3.61 cm
Single Plane A2C EF: 52.2 %
Single Plane A4C EF: 43.2 %

## 2023-11-14 NOTE — Progress Notes (Signed)
  Echocardiogram 2D Echocardiogram has been performed.  Sandra [name] 11/14/2023, 11:35 AM

## 2023-11-14 NOTE — Research (Signed)
 SITE: [id number]     Subject # [id number]   Subprotocol: A  Inclusion Criteria  Patients who meet all of the following criteria are eligible for enrollment as study participants:  Yes No  Age > 57 years old X   Eligible to wear Holter Study X    Exclusion Criteria  Patients who meet any of these criteria are not eligible for enrollment as study participants: Yes No  1. Receiving any mechanical (respiratory or circulatory) or renal support therapy at Screening or during Visit #1.  X  2.  Any other conditions that in the opinion of the investigators are likely to prevent compliance with the study protocol or pose a safety concern if the subject participates in the study.  X  3. Poor tolerance, namely susceptible to severe skin allergies from ECG adhesive patch application.  X   Protocol: REV H    60 minute start window         Cor device must be applied, and the study initiated, no later than 60 minutes of completing the Echocardiogram                             HH:MM  Echo completion time  11:35  2.   Cor Study start time  11:39   30-Minute execution window  Once Cor Monitoring begins, 3 QT Med ECGs and the 15-minute rest period must be completed within a 30 minute window     HH:MM  3. QT Med ECG Completion time  11:51  4. Start of 15-Min sitting rest period  11:51  5. End of 15-Min rest period  12:06  6. Time of device removal  12:12   *Continue to use the Mobile App Event feature to log the Rest period windows and follow instructions on the EF-ACT Clinical Trial  Patient Instruction Card.  Describe any anomalies in Protocol execution in the Protocol Deviation Log    Residential Zip code [phone] (First 3 digits ONLY)                                           [facility] Informed Consent   Subject Name: Sandra [name]  Subject met inclusion and exclusion criteria.  The informed consent form, study requirements and expectations were reviewed with the subject. Subject had opportunity to  read consent and questions and concerns were addressed prior to the signing of the consent form.  The subject verbalized understanding of the trial requirements.  The subject agreed to participate in the [facility] EF trial and signed the informed consent at 11:37 on 14-Nov-2023.  The informed consent was obtained prior to performance of any protocol-specific procedures for the subject.  A copy of the signed informed consent was given to the subject and a copy was placed in the subject's medical record.   Dorthea LITTIE Louder          Current Outpatient Medications:    aspirin  EC 81 MG tablet, Take 1 tablet (81 mg total) by mouth daily. Swallow whole., Disp: 30 tablet, Rfl: 9   atorvastatin  (LIPITOR ) 80 MG tablet, Take 1 tablet (80 mg total) by mouth daily., Disp: 90 tablet, Rfl: 0   budesonide -formoterol  (SYMBICORT ) 160-4.5 MCG/ACT inhaler, Inhale 1 puff into the lungs 2 (two) times daily as needed., Disp: 1 each, Rfl: 12   Continuous Glucose  Sensor (DEXCOM G7 SENSOR) MISC, Place new sensor every 10 days. Use to monitor blood sugar continuously., Disp: 9 each, Rfl: 3   dapagliflozin  propanediol (FARXIGA ) 10 MG TABS tablet, Take 1 tablet (10 mg total) by mouth daily., Disp: 90 tablet, Rfl: 3   Dextromethorphan  HBr 15 MG/15ML LIQD, Take 10 mLs (10 mg total) by mouth in the morning, at noon, and at bedtime., Disp: 354 mL, Rfl: 1   Dulaglutide  (TRULICITY ) 4.5 MG/0.5ML SOAJ, Inject 4.5 mg into the skin once a week., Disp: 4 mL, Rfl: 2   escitalopram  (LEXAPRO ) 10 MG tablet, Take 1 tablet (10 mg total) by mouth daily., Disp: 90 tablet, Rfl: 0   finasteride (PROSCAR) 5 MG tablet, Take 2.5 mg by mouth daily., Disp: , Rfl:    fluticasone  (FLONASE ) 50 MCG/ACT nasal spray, Place 1-2 sprays into both nostrils daily as needed for allergies or rhinitis., Disp: 10 mL, Rfl: 1   hydrALAZINE  (APRESOLINE ) 25 MG tablet, Take 1 tablet (25 mg total) by mouth 3 (three) times daily., Disp: 270 tablet, Rfl: 3   Insulin  Glargine  (BASAGLAR  KWIKPEN) 100 UNIT/ML, Inject 26 Units into the skin at bedtime., Disp: 15 mL, Rfl: 3   metFORMIN  (GLUCOPHAGE ) 1000 MG tablet, Take 1 tablet (1,000 mg total) by mouth 2 (two) times daily with a meal., Disp: 180 tablet, Rfl: 3   metoprolol  (TOPROL -XL) 200 MG 24 hr tablet, Take 1 tablet (200 mg total) by mouth daily., Disp: 90 tablet, Rfl: 3   sacubitril -valsartan  (ENTRESTO ) 97-103 MG, Take 1 tablet by mouth twice daily, Disp: 180 tablet, Rfl: 0   spironolactone  (ALDACTONE ) 25 MG tablet, Take 1 tablet (25 mg total) by mouth daily., Disp: 90 tablet, Rfl: 3

## 2023-11-15 ENCOUNTER — Ambulatory Visit: Payer: Self-pay | Admitting: Physician Assistant

## 2023-11-15 DIAGNOSIS — I5022 Chronic systolic (congestive) heart failure: Secondary | ICD-10-CM

## 2023-11-18 NOTE — Telephone Encounter (Signed)
   Patient Name: Sandra [name]  DOB: 1965/12/07 MRN: [id number]  Primary Cardiologist: Maude Emmer, MD  Chart reviewed as part of pre-operative protocol coverage. Given past medical history and time since last visit, based on ACC/AHA guidelines, [name] is at acceptable risk for the planned procedure without further cardiovascular testing.   Per [name], PA on 11/18/2023: Recent echocardiogram with stable ejection fraction. Pt stable at last office visit. Ok to proceed with surgery at acceptable CV risk.  She may hold aspirin  for 5-7 days prior to procedure. Please resume aspirin  as soon as possible postprocedure, at the discretion of the surgeon.    I will route this recommendation to the requesting party via Epic fax function and remove from pre-op pool.  Please call with questions.  Lum LITTIE Louis, NP 11/18/2023, 12:43 PM

## 2023-11-18 NOTE — Telephone Encounter (Signed)
 Recent echocardiogram with stable ejection fraction. Pt stable at last office visit. Ok to proceed with surgery at acceptable CV risk.  [name], PA-C    11/18/2023 12:15 PM

## 2023-11-19 NOTE — Telephone Encounter (Signed)
Lvm of results and clinic number to call if any questions or concerns

## 2023-11-25 ENCOUNTER — Encounter: Payer: Self-pay | Admitting: Radiology

## 2023-12-02 ENCOUNTER — Telehealth: Payer: Self-pay | Admitting: Orthopaedic Surgery

## 2023-12-02 NOTE — Telephone Encounter (Signed)
 Pt called requesting a call back from debbie. Pt has questions about upcoming surgery. Pt want to know if she has an out of pocket amount  and was all her clearances received. Pt phone number is [phone].

## 2023-12-09 ENCOUNTER — Other Ambulatory Visit: Payer: Self-pay | Admitting: Physician Assistant

## 2023-12-09 MED ORDER — ONDANSETRON HCL 4 MG PO TABS: 4.0000 mg | ORAL_TABLET | Freq: Three times a day (TID) | ORAL | 0 refills | Status: AC | PRN

## 2023-12-09 MED ORDER — DOCUSATE SODIUM 100 MG PO CAPS: 100.0000 mg | ORAL_CAPSULE | Freq: Every day | ORAL | 2 refills | Status: AC | PRN

## 2023-12-09 MED ORDER — OXYCODONE-ACETAMINOPHEN 5-325 MG PO TABS: 1.0000 | ORAL_TABLET | Freq: Three times a day (TID) | ORAL | 0 refills | Status: DC | PRN

## 2023-12-09 MED ORDER — METHOCARBAMOL 750 MG PO TABS
750.0000 mg | ORAL_TABLET | Freq: Three times a day (TID) | ORAL | 2 refills | Status: AC | PRN
Start: 2023-12-09 — End: ?

## 2023-12-11 NOTE — Progress Notes (Signed)
 Surgical Instructions   Your procedure is scheduled on Monday December 23, 2023. Report to [facility] Main Entrance A at 5:30 A.M., then check in with the Admitting office. Any questions or running late day of surgery: call [phone]  Questions prior to your surgery date: call [phone], Monday-Friday, 8am-4pm. If you experience any cold or flu symptoms such as cough, fever, chills, shortness of breath, etc. between now and your scheduled surgery, please notify us  at the above number.     Remember:  Do not eat after midnight the night before your surgery  You may drink clear liquids until 4:15 the morning of your surgery.   Clear liquids allowed are: Water, Non-Citrus Juices (without pulp), Carbonated Beverages, Clear Tea (no milk, honey, etc.), Black Coffee Only (NO MILK, CREAM OR POWDERED CREAMER of any kind), and Gatorade.  Patient Instructions  The night before surgery:  No food after midnight. ONLY clear liquids after midnight  The day of surgery (if you have diabetes): Drink ONE (1) 12 oz G2 given to you in your pre admission testing appointment by 4:15  the morning of surgery. Drink in one sitting. Do not sip.  This drink was given to you during your hospital  pre-op appointment visit.  Nothing else to drink after completing the  12 oz bottle of G2.         If you have questions, please contact your surgeon's office.   Take these medicines the morning of surgery with A SIP OF WATER  atorvastatin  (LIPITOR )  escitalopram  (LEXAPRO )  finasteride (PROSCAR)  hydrALAZINE  (APRESOLINE )  metoprolol  (TOPROL -XL)   May take these medicines IF NEEDED: budesonide -formoterol  (SYMBICORT ) 160-4.5 MCG/ACT inhaler  fluticasone  (FLONASE )  methocarbamol  (ROBAXIN )  ondansetron  (ZOFRAN )    PER YOUR PRIMARY CARE PHYSICIAN'S INSTRUCTIONS PLEASE HOLD YOUR ASPIRIN  7 DAYS PRIOR TO SURGERY, WITH THE LAST DOSE BEING 12/15/2023.  PER YOUR PRIMARY CARE PHYSICIAN'S INSTRUCTIONS PLEASE HOLD  YOUR dapagliflozin  propanediol (FARXIGA ) 3 DAYS PRIOR TO SURGERY, WITH THE LAST DOSE BEING 12/19/2023.   One week prior to surgery, STOP taking any by your surgeon) Aleve , Naproxen , Ibuprofen , Motrin , Advil , Goody's, BC's, all herbal medications, fish oil, and non-prescription vitamins.   WHAT DO I DO ABOUT MY DIABETES MEDICATION?   Do not take oral diabetes medicines (pills) the morning of surgery.        DO NOT TAKE YOUR Dulaglutide  (TRULICITY ) 7 DAYS PRIOR TO SURGERY WITH THE LAST DOSE BEING NO LATER THAN 12/15/2023.          DO NOT TAKE YOUR metFORMIN  (GLUCOPHAGE ) THE MORNING OF SURGERY.      THE NIGHT BEFORE SURGERY TAKE 13 UNITS OF YOUR Insulin  Glargine (BASAGLAR  KWIKPEN), WHICH IS 50% OF YOUR REGULAR DOSE.        The day of surgery, do not take other diabetes injectables, including Byetta (exenatide), Bydureon (exenatide ER), Victoza  (liraglutide ), or Trulicity  (dulaglutide ).  If your CBG is greater than 220 mg/dL, you may take  of your sliding scale (correction) dose of insulin .   HOW TO MANAGE YOUR DIABETES BEFORE AND AFTER SURGERY  Why is it important to control my blood sugar before and after surgery? Improving blood sugar levels before and after surgery helps healing and can limit problems. A way of improving blood sugar control is eating a healthy diet by:  Eating less sugar and carbohydrates  Increasing activity/exercise  Talking with your doctor about reaching your blood sugar goals High blood sugars (greater than 180 mg/dL) can raise your risk of infections  and slow your recovery, so you will need to focus on controlling your diabetes during the weeks before surgery. Make sure that the doctor who takes care of your diabetes knows about your planned surgery including the date and location.  How do I manage my blood sugar before surgery? Check your blood sugar at least 4 times a day, starting 2 days before surgery, to make sure that the level is not too high or  low.  Check your blood sugar the morning of your surgery when you wake up and every 2 hours until you get to the Short Stay unit.  If your blood sugar is less than 70 mg/dL, you will need to treat for low blood sugar: Do not take insulin . Treat a low blood sugar (less than 70 mg/dL) with  cup of clear juice (cranberry or apple), 4 glucose tablets, OR glucose gel. Recheck blood sugar in 15 minutes after treatment (to make sure it is greater than 70 mg/dL). If your blood sugar is not greater than 70 mg/dL on recheck, call [phone] for further instructions. Report your blood sugar to the short stay nurse when you get to Short Stay.  If you are admitted to the hospital after surgery: Your blood sugar will be checked by the staff and you will probably be given insulin  after surgery (instead of oral diabetes medicines) to make sure you have good blood sugar levels. The goal for blood sugar control after surgery is 80-180 mg/dL.                      Do NOT Smoke (Tobacco/Vaping) for 24 hours prior to your procedure.  If you use a CPAP at night, you may bring your mask/headgear for your overnight stay.   You will be asked to remove any contacts, glasses, piercing's, hearing aid's, dentures/partials prior to surgery. Please bring cases for these items if needed.    Patients discharged the day of surgery will not be allowed to drive home, and someone needs to stay with them for 24 hours.  SURGICAL WAITING ROOM VISITATION Patients may have no more than 2 support people in the waiting area - these visitors may rotate.   Pre-op nurse will coordinate an appropriate time for 1 ADULT support person, who may not rotate, to accompany patient in pre-op.  Children under the age of 32 must have an adult with them who is not the patient and must remain in the main waiting area with an adult.  If the patient needs to stay at the hospital during part of their recovery, the visitor guidelines for inpatient  rooms apply.  Please refer to the [facility] website for the visitor guidelines for any additional information.   If you received a COVID test during your pre-op visit  it is requested that you wear a mask when out in public, stay away from anyone that may not be feeling well and notify your surgeon if you develop symptoms. If you have been in contact with anyone that has tested positive in the last 10 days please notify you surgeon.      Pre-operative 4 CHG Bathing Instructions   You can play a key role in reducing the risk of infection after surgery. Your skin needs to be as free of germs as possible. You can reduce the number of germs on your skin by washing with CHG (chlorhexidine  gluconate) soap before surgery. CHG is an antiseptic soap that kills germs and continues to kill  germs even after washing.   DO NOT use if you have an allergy to chlorhexidine /CHG or antibacterial soaps. If your skin becomes reddened or irritated, stop using the CHG and notify one of our RNs at [phone].   Please shower with the CHG soap starting 4 days before surgery using the following schedule:     Please keep in mind the following:  DO NOT shave, including legs and underarms, starting the day of your first shower.   Place clean sheets on your bed the day you start using CHG soap. Use a clean washcloth (not used since being washed) for each shower. DO NOT sleep with pets once you start using the CHG.   CHG Shower Instructions:  Wash your face and private area with normal soap. If you choose to wash your hair, wash first with your normal shampoo.  After you use shampoo/soap, rinse your hair and body thoroughly to remove shampoo/soap residue.  Turn the water OFF and apply  bottle of CHG soap to a CLEAN washcloth.  Apply CHG soap ONLY FROM YOUR NECK DOWN TO YOUR TOES (washing for 3-5 minutes)  DO NOT use CHG soap on face, private areas, open wounds, or sores.  Pay special attention to the area  where your surgery is being performed.  If you are having back surgery, having someone wash your back for you may be helpful. Wait 2 minutes after CHG soap is applied, then you may rinse off the CHG soap.  Pat dry with a clean towel  Put on clean clothes/pajamas   If you choose to wear lotion, please use ONLY the CHG-compatible lotions that are listed below.  Additional instructions for the day of surgery:  If you choose, you may shower the morning of surgery with an antibacterial soap.  DO NOT APPLY any lotions, deodorants or perfumes.   Do not bring valuables to the hospital. [facility] is not responsible for any belongings/valuables. Do not wear nail polish, gel polish, artificial nails, or any other type of covering on natural nails (fingers and toes) Do not wear jewelry or makeup Put on clean/comfortable clothes.  Please brush your teeth.  Ask your nurse before applying any prescription medications to the skin.     CHG Compatible Lotions   Aveeno Moisturizing lotion  Cetaphil Moisturizing Cream  Cetaphil Moisturizing Lotion  Clairol Herbal Essence Moisturizing Lotion, Dry Skin  Clairol Herbal Essence Moisturizing Lotion, Extra Dry Skin  Clairol Herbal Essence Moisturizing Lotion, Normal Skin  Curel Age Defying Therapeutic Moisturizing Lotion with Alpha Hydroxy  Curel Extreme Care Body Lotion  Curel Soothing Hands Moisturizing Hand Lotion  Curel Therapeutic Moisturizing Cream, Fragrance-Free  Curel Therapeutic Moisturizing Lotion, Fragrance-Free  Curel Therapeutic Moisturizing Lotion, Original Formula  Eucerin Daily Replenishing Lotion  Eucerin Dry Skin Therapy Plus Alpha Hydroxy Crme  Eucerin Dry Skin Therapy Plus Alpha Hydroxy Lotion  Eucerin Original Crme  Eucerin Original Lotion  Eucerin Plus Crme Eucerin Plus Lotion  Eucerin TriLipid Replenishing Lotion  Keri Anti-Bacterial Hand Lotion  Keri Deep Conditioning Original Lotion Dry Skin Formula Softly Scented   Keri Deep Conditioning Original Lotion, Fragrance Free Sensitive Skin Formula  Keri Lotion Fast Absorbing Fragrance Free Sensitive Skin Formula  Keri Lotion Fast Absorbing Softly Scented Dry Skin Formula  Keri Original Lotion  Keri Skin Renewal Lotion Keri Silky Smooth Lotion  Keri Silky Smooth Sensitive Skin Lotion  Nivea Body Creamy Conditioning Oil  Nivea Body Extra Enriched Teacher, Adult Education  Moisturizing Lotion Nivea Crme  Nivea Skin Firming Lotion  NutraDerm 30 Skin Lotion  NutraDerm Skin Lotion  NutraDerm Therapeutic Skin Cream  NutraDerm Therapeutic Skin Lotion  ProShield Protective Hand Cream  Provon moisturizing lotion  Please read over the following fact sheets that you were given.

## 2023-12-12 ENCOUNTER — Encounter (HOSPITAL_COMMUNITY): Payer: Self-pay

## 2023-12-12 ENCOUNTER — Encounter (HOSPITAL_COMMUNITY)
Admission: RE | Admit: 2023-12-12 | Discharge: 2023-12-12 | Disposition: A | Discharge status: Home / Self Care | Source: Ambulatory Visit | Attending: Orthopaedic Surgery | Admitting: Orthopaedic Surgery

## 2023-12-12 ENCOUNTER — Other Ambulatory Visit: Payer: Self-pay

## 2023-12-12 VITALS — BP 150/66 | HR 74 | Temp 98.5°F | Resp 18 | Ht 60.0 in | Wt 194.9 lb

## 2023-12-12 DIAGNOSIS — E785 Hyperlipidemia, unspecified: Secondary | ICD-10-CM | POA: Insufficient documentation

## 2023-12-12 DIAGNOSIS — I5022 Chronic systolic (congestive) heart failure: Secondary | ICD-10-CM | POA: Insufficient documentation

## 2023-12-12 DIAGNOSIS — E1169 Type 2 diabetes mellitus with other specified complication: Secondary | ICD-10-CM | POA: Diagnosis not present

## 2023-12-12 DIAGNOSIS — R0609 Other forms of dyspnea: Secondary | ICD-10-CM | POA: Diagnosis not present

## 2023-12-12 DIAGNOSIS — Z7984 Long term (current) use of oral hypoglycemic drugs: Secondary | ICD-10-CM | POA: Diagnosis not present

## 2023-12-12 DIAGNOSIS — I428 Other cardiomyopathies: Secondary | ICD-10-CM | POA: Diagnosis not present

## 2023-12-12 DIAGNOSIS — J45909 Unspecified asthma, uncomplicated: Secondary | ICD-10-CM | POA: Diagnosis not present

## 2023-12-12 DIAGNOSIS — Z7982 Long term (current) use of aspirin: Secondary | ICD-10-CM | POA: Insufficient documentation

## 2023-12-12 DIAGNOSIS — I110 Hypertensive heart disease with heart failure: Secondary | ICD-10-CM | POA: Insufficient documentation

## 2023-12-12 DIAGNOSIS — Z7985 Long-term (current) use of injectable non-insulin antidiabetic drugs: Secondary | ICD-10-CM | POA: Diagnosis not present

## 2023-12-12 DIAGNOSIS — Z01818 Encounter for other preprocedural examination: Secondary | ICD-10-CM | POA: Diagnosis present

## 2023-12-12 DIAGNOSIS — Z01812 Encounter for preprocedural laboratory examination: Secondary | ICD-10-CM | POA: Diagnosis not present

## 2023-12-12 DIAGNOSIS — L409 Psoriasis, unspecified: Secondary | ICD-10-CM | POA: Insufficient documentation

## 2023-12-12 DIAGNOSIS — I447 Left bundle-branch block, unspecified: Secondary | ICD-10-CM | POA: Insufficient documentation

## 2023-12-12 DIAGNOSIS — M1711 Unilateral primary osteoarthritis, right knee: Secondary | ICD-10-CM | POA: Diagnosis not present

## 2023-12-12 HISTORY — DX: Unspecified osteoarthritis, unspecified site: M19.90

## 2023-12-12 LAB — CBC
HCT: 34.7 % — ABNORMAL LOW (ref 36.0–46.0)
Hemoglobin: 11.6 g/dL — ABNORMAL LOW (ref 12.0–15.0)
MCH: 34 pg (ref 26.0–34.0)
MCHC: 33.4 g/dL (ref 30.0–36.0)
MCV: 101.8 fL — ABNORMAL HIGH (ref 80.0–100.0)
Platelets: 266 K/uL (ref 150–400)
RBC: 3.41 MIL/uL — ABNORMAL LOW (ref 3.87–5.11)
RDW: 13.4 % (ref 11.5–15.5)
WBC: 3.9 K/uL — ABNORMAL LOW (ref 4.0–10.5)
nRBC: 0 % (ref 0.0–0.2)

## 2023-12-12 LAB — GLUCOSE, CAPILLARY: Glucose-Capillary: 134 mg/dL — ABNORMAL HIGH (ref 70–99)

## 2023-12-12 LAB — BASIC METABOLIC PANEL WITH GFR
Anion gap: 12 (ref 5–15)
BUN: 13 mg/dL (ref 6–20)
CO2: 23 mmol/L (ref 22–32)
Calcium: 9 mg/dL (ref 8.9–10.3)
Chloride: 105 mmol/L (ref 98–111)
Creatinine, Ser: 0.93 mg/dL (ref 0.44–1.00)
GFR, Estimated: >60 mL/min (ref >60)
Glucose, Bld: 139 mg/dL — ABNORMAL HIGH (ref 70–99)
Potassium: 3.9 mmol/L (ref 3.5–5.1)
Sodium: 140 mmol/L (ref 135–145)

## 2023-12-12 LAB — SURGICAL PCR SCREEN
MRSA, PCR: NEGATIVE
Staphylococcus aureus: POSITIVE — AB

## 2023-12-12 NOTE — Progress Notes (Signed)
 PCP - Dr. Ozell Nearing, LOV 10/24/2023, clearand Cardiologist - Dr. Maude Emmer, LOV 11/18/2023, clearance  PPM/ICD - denies Device Orders - na Rep Notified - na  Chest x-ray - 03/27/2023 EKG - 03/28/2023 Stress Test - 05/20/2017 ECHO - 11/14/2023 Cardiac Cath -   Sleep Study - denies CPAP - na   Type II diabetic.  A1C 7.2 on 10/17/2023. Blood sugar 134 at PAT appointment. CGM Dexcom 7, right arm Fasting Blood Sugar - 150's Checks Blood Sugar : continuous Farxiga , hold 3 days per Dr. Nearing Metformin , hold DOS Insulin  Glargine, use 13 units which is 50% of your regular dose  Last dose of GLP1 agonist- Trulicity  GLP1 instructions: Hold 7 days, states last dose was 12/12/2023  Blood Thinner Instructions:denies Aspirin  Instructions:Hold 7 days per Dr. Zheng  ERAS Protcol - G2 until 0415  Anesthesia review: Yes. HTN, DM, LBBB, cardiomyopathy, HF  Patient denies shortness of breath, fever, cough and chest pain at PAT appointment   All instructions explained to the patient, with a verbal understanding of the material. Patient agrees to go over the instructions while at home for a better understanding. Patient also instructed to self quarantine after being tested for COVID-19. The opportunity to ask questions was provided.

## 2023-12-13 NOTE — Progress Notes (Signed)
 Anesthesia Chart Review:  Case: [id number] Date/Time: 12/23/23 0700   Procedure: ARTHROPLASTY, KNEE, TOTAL (Right: Knee)   Anesthesia type: Spinal   Diagnosis: Primary osteoarthritis of right knee [M17.11]   Pre-op diagnosis: right knee osteoarthritis   [facility]: [facility] ROOM 09 / [facility] OR   Surgeons: [name], MD       DISCUSSION: Patient is a 58-year-old female scheduled for the above procedure.  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  She has preoperative medical clearance visit on 10/24/2023 with Zheng, Michael, DO. He wrote, .SABRASABRARCRI: 2 (5% risk of major cardiac event). DASI: >6 METS   Patient is Intermediate risk at this time for the listed procedure. A1c improved to 7.2% in 09/2023. Certain medications will need to be held prior to procedure. Pending cardiology clearance with repeat echo.  -Hold: Aspirin  7 days prior to surgery  -Hold: Farxiga  3 days prior to surgery  -Hold: Trulicity  1 week prior to surgery.  She had office follow-up with [name], PA-C on 09/30/2023.  She was first established with cardiology in 2014 for HTN, LVH, incomplete LBBB. EF 50-55% on 01/24/2012 TTE (31% by nuclear stress test). Cardiac MRI 03/10/2012 showed LVEF 46%, no gadolinium uptake. Managed with HTN control. Re-established in April 2019 for chest pain, dyspnea, LBBB. 04/2017 TTE showed LVEF 40-45% and Myoview  was considered non-ischemic, EF 38%. She has been seen fairly regularly since then. Last office visit was on 09/30/2023 with [name], PA-C. Her LVEF in 04/2023 showed shown an EF down to 35-40%. She had been off SGLT2i due to cost, so PharmD consulted for assistance. Limited TTE recommended prior to TKA and was done on 11/14/2023 showing back up to 40-45%, no RWMA, paradoxical septal motion consistent with LBBB, mild LVH, normal RV  systolic function, trivial MR. He added, Recent echocardiogram with stable ejection fraction. Pt stable at last office visit. Ok to proceed with surgery at acceptable CV risk.   A1c 7.2% 10/17/2023. She has a Dexcom 7 CGM. She is on Trulicity , Farxiga , metformin , insulin  glargine. To hold Farxiga  for 3 days prior to surgery. Last dose Trulicity  12/12/2023.  Anesthesia team to evaluate on the day of surgery. Last ASA planned for 7 days prior to surgery.    VS: BP (!) 150/66   Pulse 74   Temp 36.9 C   Resp 18   Ht 5' (1.524 m)   Wt 88.4 kg   SpO2 98%   BMI 38.06 kg/m   PROVIDERS: [name], MD is PCP  [name], MD is cardiologist    LABS: Labs reviewed: Acceptable for surgery. (all labs ordered are listed, but only abnormal results are displayed)  Labs Reviewed  SURGICAL PCR SCREEN - Abnormal; Notable for the following components:      Result Value   Staphylococcus aureus POSITIVE (*)    All other components within normal limits  GLUCOSE, CAPILLARY - Abnormal; Notable for the following components:   Glucose-Capillary 134 (*)    All other components within normal limits  CBC - Abnormal; Notable for the following components:   WBC 3.9 (*)    RBC 3.41 (*)    Hemoglobin 11.6 (*)    HCT 34.7 (*)    MCV 101.8 (*)    All other components within normal limits  BASIC METABOLIC PANEL WITH GFR - Abnormal; Notable for the following components:   Glucose, Bld  139 (*)    All other components within normal limits     IMAGES: Xray bilateral knees 09/25/2023: IMPRESSION: 1. No acute fracture or dislocation of the knees. 2. Small RIGHT knee joint effusion. 3. BILATERAL tricompartmental osteoarthritis, worse on the RIGHT.   CXR 03/27/2023: FINDINGS: Stable cardiomediastinal silhouette. Both lungs are clear. The visualized skeletal structures are unremarkable. IMPRESSION: No active cardiopulmonary disease.   EKG: 03/27/2023: Normal sinus rhythm Right axis  deviation Non-specific intra-ventricular conduction block Cannot rule out Anterior infarct , age undetermined T wave abnormality, consider inferior ischemia Abnormal ECG When compared with ECG of 14-Oct-2019 16:31, PREVIOUS ECG IS PRESENT since last tracing no significant change Confirmed by [name] [phone]) on 03/27/2023 10:28:49 AM   CV: TTE (Limited) 11/14/2023: IMPRESSIONS   1. Left ventricular ejection fraction, by estimation, is 40 to 45%. The  left ventricle has mildly decreased function. The left ventricle  demonstrates regional wall motion abnormalities. Abnormal (paradoxical)  septal motion, consistent with left bundle  branch block.      There is mild left ventricular hypertrophy. Left ventricular diastolic  parameters are indeterminate.   2. Right ventricular systolic function is normal. The right ventricular  size is normal. Tricuspid regurgitation signal is inadequate for assessing  PA pressure.   3. The mitral valve is normal in structure. Trivial mitral valve  regurgitation.   4. The aortic valve was not well visualized. Aortic valve regurgitation  is not visualized.   5. The inferior vena cava is normal in size with greater than 50%  respiratory variability, suggesting right atrial pressure of 3 mmHg.    TTE (Complete) 05/15/2023: IMPRESSIONS   1. Left ventricular ejection fraction, by estimation, is 35 to 40%. Left  ventricular ejection fraction by 3D volume is 39 %. The left ventricle has  moderately decreased function. The left ventricle demonstrates global  hypokinesis. Indeterminate  diastolic filling due to E-A fusion.   2. Right ventricular systolic function is normal. The right ventricular  size is normal. Mildly increased right ventricular wall thickness.  Tricuspid regurgitation signal is inadequate for assessing PA pressure.   3. Left atrial size was mildly dilated.   4. The mitral valve is normal in structure. Trivial mitral valve   regurgitation. No evidence of mitral stenosis.   5. The aortic valve is tricuspid. Aortic valve regurgitation is not  visualized. No aortic stenosis is present.   6. The inferior vena cava is normal in size with greater than 50%  respiratory variability, suggesting right atrial pressure of 3 mmHg.  - Comparison(s): Prior images reviewed side by side. The left ventricular function is worsened. The LVEF may have been overestimated on the 2023 study. Overall, the appearance is very similar to the 2021 study.    Nuclear stress test 05/20/2017: Nuclear stress EF: 38%. No T wave inversion was noted during stress. There was no ST segment deviation noted during stress. Defect 1: There is a small defect of mild severity present in the apical septal location. This is an intermediate risk study. The left ventricular ejection fraction is moderately decreased (30-44%).   Intermediate risk study due to moderately depressed left ventricular systolic function, but without reversible perfusion abnormalities. There is a small fixed LBBB-related perfusion artifact.    MRI Cardiac 03/10/2012: Impression:  1)    Mild LVE with septal and anterior wall hypokinesis.  EF 46%  2)    No hyperenhancement or scar tissue  3)    Mild LAE.  4)  Normal right sided cardiac chambers.    Past Medical History:  Diagnosis Date   Acute conjunctivitis, right eye 01/31/2022   Acute pain of left shoulder 06/07/2017   Adenomyosis 06/03/2012   Arthritis    Asthma    as a child   Asthma 11/07/2005   Reported Diagnosis in 2012, no formal spirometry    Cardiomyopathy (HCC)    likely nonischemic DCM per Dr. 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   Perimenopausal menorrhagia 02/20/2012   Perimenopausal menorrhagia 02/20/2012   Psoriasis 04/29/2013   Right foot strain 05/15/2021   Right low back pain 03/02/2014   Routine adult health maintenance 09/27/2010   Swelling of abdominal wall 03/02/2014   Swelling of abdominal wall 03/02/2014   Symptoms of upper respiratory infection (URI) 04/01/2018   Tinnitus 06/01/2020   Viral illness 10/08/2013   Viral URI with cough 01/13/2013    Past Surgical History:  Procedure Laterality Date   CHOLECYSTECTOMY  2009   DILATATION & CURETTAGE/HYSTEROSCOPY WITH MYOSURE N/A 12/25/2017   Procedure: DILATATION & CURETTAGE/HYSTEROSCOPY   POLYPECTOMY WITH MYOSURE;  Surgeon: Linnell Devere BRAVO, MD;  Location: [facility];  Service: Gynecology;  Laterality: N/A;   TUBAL LIGATION       MEDICATIONS:  aspirin  EC 81 MG tablet   atorvastatin  (LIPITOR ) 80 MG tablet   budesonide -formoterol  (SYMBICORT ) 160-4.5 MCG/ACT inhaler   clobetasol (TEMOVATE) 0.05 % external solution   Continuous Glucose Sensor (DEXCOM G7 SENSOR) MISC   dapagliflozin  propanediol (FARXIGA ) 10 MG TABS tablet   Dextromethorphan  HBr 15 MG/15ML LIQD   docusate sodium  (COLACE) 100 MG capsule   Dulaglutide  (TRULICITY ) 4.5 MG/0.5ML SOAJ   escitalopram  (LEXAPRO ) 10 MG tablet   finasteride (PROSCAR) 5 MG tablet   fluticasone  (FLONASE ) 50 MCG/ACT nasal spray   hydrALAZINE  (APRESOLINE ) 25 MG tablet   Insulin  Glargine (BASAGLAR  KWIKPEN) 100 UNIT/ML   metFORMIN  (GLUCOPHAGE ) 1000 MG tablet   methocarbamol  (ROBAXIN ) 750 MG tablet   metoprolol  (TOPROL -XL) 200 MG 24 hr tablet   ondansetron  (ZOFRAN ) 4 MG tablet   oxyCODONE -acetaminophen  (PERCOCET) 5-325 MG tablet   sacubitril -valsartan  (ENTRESTO ) 97-103 MG   spironolactone  (ALDACTONE ) 25 MG tablet   No current facility-administered  medications for this encounter.    Isaiah Ruder, PA-C Surgical Short Stay/Anesthesiology [facility] Phone [phone] [facility] Phone [phone] 12/13/2023 1:16 PM

## 2023-12-13 NOTE — Anesthesia Preprocedure Evaluation (Addendum)
 Anesthesia Evaluation  Patient identified by MRN, date of birth, ID band Patient awake    Reviewed: Allergy & Precautions, H&P , NPO status , Patient's Chart, lab work & pertinent test results, reviewed documented beta blocker date and time   Airway Mallampati: I  TM Distance: >3 FB Neck ROM: full    Dental no notable dental hx. (+) Poor Dentition,    Pulmonary asthma    Pulmonary exam normal breath sounds clear to auscultation       Cardiovascular Exercise Tolerance: Good hypertension, + dysrhythmias  Rhythm:regular Rate:Normal  Cardiomyopathy   ECHO 19' Impressions: Normal LV size with EF 40-45%. Septal and inferior hypokinesis   with septal-lateral dyssynchrony consistent with LBBB. Normal RV   size and systolic function. No significant valvular   abnormalities.   Neuro/Psych  Headaches  Anxiety      Neuromuscular disease  negative psych ROS   GI/Hepatic negative GI ROS, Neg liver ROS,,,  Endo/Other  diabetes, Type 2    Renal/GU negative Renal ROS  negative genitourinary   Musculoskeletal  (+) Arthritis ,    Abdominal   Peds  Hematology  (+) Blood dyscrasia, anemia   Anesthesia Other Findings   Reproductive/Obstetrics negative OB ROS                              Anesthesia Physical Anesthesia Plan  ASA: III  Anesthesia Plan: Spinal   Post-op Pain Management: Regional block*   Induction: Intravenous  PONV Risk Score and Plan: 2 and Propofol  infusion and Treatment may vary due to age or medical condition  Airway Management Planned: Simple Face Mask  Additional Equipment:   Intra-op Plan:   Post-operative Plan:   Informed Consent: I have reviewed the patients History and Physical, chart, labs and discussed the procedure including the risks, benefits and alternatives for the proposed anesthesia with the patient or authorized representative who has indicated his/her  understanding and acceptance.     Dental Advisory Given  Plan Discussed with: CRNA, Anesthesiologist and Surgeon  Anesthesia Plan Comments: (PAT note written 12/13/2023 by Allison Zelenak, PA-C.  )         Anesthesia Quick Evaluation

## 2023-12-17 ENCOUNTER — Telehealth: Payer: Self-pay | Admitting: Orthopaedic Surgery

## 2023-12-17 NOTE — Telephone Encounter (Signed)
 6 weeks if the retina surgery is nonurgent

## 2023-12-17 NOTE — Telephone Encounter (Signed)
 Patient is scheduled for right total knee arthroplasty at [facility] 12-23-23.  She is also wanting to be scheduled for eye surgery (right eye) to correct her vision.  This would be done at [facility].  Patient would like to know if there is a time frame in which she should wait before having this surgery after having the total knee.  Please call patient at [phone].

## 2023-12-20 ENCOUNTER — Other Ambulatory Visit: Payer: Self-pay

## 2023-12-20 DIAGNOSIS — E785 Hyperlipidemia, unspecified: Secondary | ICD-10-CM

## 2023-12-20 DIAGNOSIS — M1711 Unilateral primary osteoarthritis, right knee: Secondary | ICD-10-CM | POA: Insufficient documentation

## 2023-12-20 MED ORDER — TRANEXAMIC ACID 1000 MG/10ML IV SOLN
2000.0000 mg | INTRAVENOUS | Status: DC
  Filled 2023-12-20: qty 20

## 2023-12-23 ENCOUNTER — Other Ambulatory Visit: Payer: Self-pay | Admitting: Physician Assistant

## 2023-12-23 ENCOUNTER — Ambulatory Visit (HOSPITAL_COMMUNITY): Payer: Self-pay

## 2023-12-23 ENCOUNTER — Encounter (HOSPITAL_COMMUNITY): Payer: Self-pay | Admitting: Orthopaedic Surgery

## 2023-12-23 ENCOUNTER — Other Ambulatory Visit: Payer: Self-pay

## 2023-12-23 ENCOUNTER — Ambulatory Visit (HOSPITAL_COMMUNITY): Payer: Self-pay | Admitting: Vascular Surgery

## 2023-12-23 ENCOUNTER — Encounter (HOSPITAL_COMMUNITY): Admission: RE | Disposition: A | Payer: Self-pay | Source: Home / Self Care | Attending: Orthopaedic Surgery

## 2023-12-23 ENCOUNTER — Observation Stay (HOSPITAL_COMMUNITY)

## 2023-12-23 ENCOUNTER — Telehealth (HOSPITAL_COMMUNITY): Payer: Self-pay | Admitting: Pharmacy Technician

## 2023-12-23 ENCOUNTER — Other Ambulatory Visit (HOSPITAL_COMMUNITY): Payer: Self-pay

## 2023-12-23 ENCOUNTER — Observation Stay (HOSPITAL_COMMUNITY)
Admission: RE | Admit: 2023-12-23 | Discharge: 2023-12-24 | Disposition: A | Discharge status: Home / Self Care | Attending: Orthopaedic Surgery | Admitting: Orthopaedic Surgery

## 2023-12-23 DIAGNOSIS — M1711 Unilateral primary osteoarthritis, right knee: Secondary | ICD-10-CM | POA: Diagnosis present

## 2023-12-23 DIAGNOSIS — M94261 Chondromalacia, right knee: Secondary | ICD-10-CM | POA: Diagnosis not present

## 2023-12-23 DIAGNOSIS — I10 Essential (primary) hypertension: Secondary | ICD-10-CM | POA: Diagnosis not present

## 2023-12-23 DIAGNOSIS — E1169 Type 2 diabetes mellitus with other specified complication: Secondary | ICD-10-CM

## 2023-12-23 DIAGNOSIS — M21161 Varus deformity, not elsewhere classified, right knee: Secondary | ICD-10-CM | POA: Insufficient documentation

## 2023-12-23 DIAGNOSIS — E119 Type 2 diabetes mellitus without complications: Secondary | ICD-10-CM | POA: Insufficient documentation

## 2023-12-23 DIAGNOSIS — J45909 Unspecified asthma, uncomplicated: Secondary | ICD-10-CM | POA: Insufficient documentation

## 2023-12-23 DIAGNOSIS — Z79899 Other long term (current) drug therapy: Secondary | ICD-10-CM | POA: Insufficient documentation

## 2023-12-23 DIAGNOSIS — Z96651 Presence of right artificial knee joint: Secondary | ICD-10-CM

## 2023-12-23 DIAGNOSIS — Z7982 Long term (current) use of aspirin: Secondary | ICD-10-CM | POA: Insufficient documentation

## 2023-12-23 HISTORY — PX: TOTAL KNEE ARTHROPLASTY: SHX125

## 2023-12-23 LAB — GLUCOSE, CAPILLARY
Glucose-Capillary: 146 mg/dL — ABNORMAL HIGH (ref 70–99)
Glucose-Capillary: 119 mg/dL — ABNORMAL HIGH (ref 70–99)
Glucose-Capillary: 138 mg/dL — ABNORMAL HIGH (ref 70–99)
Glucose-Capillary: 156 mg/dL — ABNORMAL HIGH (ref 70–99)
Glucose-Capillary: 125 mg/dL — ABNORMAL HIGH (ref 70–99)

## 2023-12-23 SURGERY — ARTHROPLASTY, KNEE, TOTAL
Anesthesia: Spinal | Site: Knee | Laterality: Right

## 2023-12-23 MED ORDER — PHENOL 1.4 % MT LIQD: 1.0000 | OROMUCOSAL | Status: DC | PRN

## 2023-12-23 MED ORDER — ACETAMINOPHEN 325 MG PO TABS: 325.0000 mg | ORAL_TABLET | Freq: Four times a day (QID) | ORAL | Status: DC | PRN

## 2023-12-23 MED ORDER — METOPROLOL SUCCINATE ER 100 MG PO TB24: 200.0000 mg | ORAL_TABLET | Freq: Every day | ORAL | Status: DC

## 2023-12-23 MED ORDER — PROPOFOL 500 MG/50ML IV EMUL
INTRAVENOUS | Status: DC | PRN
  Administered 2023-12-23: 80 ug/kg/min via INTRAVENOUS

## 2023-12-23 MED ORDER — VANCOMYCIN HCL 1000 MG IV SOLR
INTRAVENOUS | Status: AC
  Filled 2023-12-23: qty 20

## 2023-12-23 MED ORDER — HYDROMORPHONE HCL 1 MG/ML IJ SOLN: 1.0000 mg | Freq: Three times a day (TID) | INTRAMUSCULAR | Status: DC | PRN

## 2023-12-23 MED ORDER — APIXABAN 2.5 MG PO TABS
2.5000 mg | ORAL_TABLET | Freq: Two times a day (BID) | ORAL | Status: DC
  Administered 2023-12-24: 2.5 mg via ORAL
  Filled 2023-12-23: qty 1

## 2023-12-23 MED ORDER — METHOCARBAMOL 1000 MG/10ML IJ SOLN
500.0000 mg | Freq: Four times a day (QID) | INTRAMUSCULAR | Status: DC | PRN
  Filled 2023-12-23: qty 5

## 2023-12-23 MED ORDER — FENTANYL CITRATE (PF) 100 MCG/2ML IJ SOLN
INTRAMUSCULAR | Status: AC
  Filled 2023-12-23: qty 2

## 2023-12-23 MED ORDER — SODIUM CHLORIDE 0.9 % IR SOLN
Status: DC | PRN
  Administered 2023-12-23: 1000 mL

## 2023-12-23 MED ORDER — PHENYLEPHRINE HCL-NACL 20-0.9 MG/250ML-% IV SOLN
INTRAVENOUS | Status: DC | PRN
  Administered 2023-12-23: 25 ug/min via INTRAVENOUS

## 2023-12-23 MED ORDER — OXYCODONE HCL 5 MG PO TABS: 5.0000 mg | ORAL_TABLET | Freq: Once | ORAL | Status: DC | PRN

## 2023-12-23 MED ORDER — TRANEXAMIC ACID 1000 MG/10ML IV SOLN
INTRAVENOUS | Status: DC | PRN
  Administered 2023-12-23: 2000 mg via TOPICAL

## 2023-12-23 MED ORDER — CHLORHEXIDINE GLUCONATE 0.12 % MT SOLN
15.0000 mL | Freq: Once | OROMUCOSAL | Status: AC
  Administered 2023-12-23: 15 mL via OROMUCOSAL
  Filled 2023-12-23: qty 15

## 2023-12-23 MED ORDER — MENTHOL 3 MG MT LOZG: 1.0000 | LOZENGE | OROMUCOSAL | Status: DC | PRN

## 2023-12-23 MED ORDER — CEFAZOLIN SODIUM-DEXTROSE 2-4 GM/100ML-% IV SOLN
2.0000 g | Freq: Four times a day (QID) | INTRAVENOUS | Status: AC
  Administered 2023-12-23 (×2): 2 g via INTRAVENOUS
  Filled 2023-12-23 (×2): qty 100

## 2023-12-23 MED ORDER — OXYCODONE HCL 5 MG PO TABS
10.0000 mg | ORAL_TABLET | Freq: Four times a day (QID) | ORAL | Status: DC | PRN
  Filled 2023-12-23: qty 2

## 2023-12-23 MED ORDER — BUPIVACAINE-MELOXICAM ER 400-12 MG/14ML IJ SOLN
INTRAMUSCULAR | Status: AC
  Filled 2023-12-23: qty 1

## 2023-12-23 MED ORDER — METOCLOPRAMIDE HCL 5 MG/ML IJ SOLN: 5.0000 mg | Freq: Three times a day (TID) | INTRAMUSCULAR | Status: DC | PRN

## 2023-12-23 MED ORDER — 0.9 % SODIUM CHLORIDE (POUR BTL) OPTIME
TOPICAL | Status: DC | PRN
  Administered 2023-12-23: 1000 mL

## 2023-12-23 MED ORDER — FENTANYL CITRATE (PF) 100 MCG/2ML IJ SOLN
INTRAMUSCULAR | Status: DC | PRN
  Administered 2023-12-23: 100 ug via INTRAVENOUS

## 2023-12-23 MED ORDER — ROPIVACAINE HCL 5 MG/ML IJ SOLN
INTRAMUSCULAR | Status: DC | PRN
  Administered 2023-12-23: 20 mL via PERINEURAL

## 2023-12-23 MED ORDER — ONDANSETRON HCL 4 MG PO TABS: 4.0000 mg | ORAL_TABLET | Freq: Four times a day (QID) | ORAL | Status: DC | PRN

## 2023-12-23 MED ORDER — SODIUM CHLORIDE 0.9 % IV SOLN: INTRAVENOUS | Status: DC

## 2023-12-23 MED ORDER — MIDAZOLAM HCL (PF) 2 MG/2ML IJ SOLN
INTRAMUSCULAR | Status: DC | PRN
  Administered 2023-12-23: 2 mg via INTRAVENOUS

## 2023-12-23 MED ORDER — PHENYLEPHRINE HCL-NACL 20-0.9 MG/250ML-% IV SOLN
INTRAVENOUS | Status: AC
  Filled 2023-12-23: qty 500

## 2023-12-23 MED ORDER — DEXAMETHASONE SOD PHOSPHATE PF 10 MG/ML IJ SOLN
INTRAMUSCULAR | Status: DC | PRN
  Administered 2023-12-23: 5 mg via INTRAVENOUS

## 2023-12-23 MED ORDER — DOXYCYCLINE HYCLATE 100 MG PO TABS: 100.0000 mg | ORAL_TABLET | Freq: Two times a day (BID) | ORAL | 0 refills | Status: AC

## 2023-12-23 MED ORDER — PRONTOSAN WOUND IRRIGATION OPTIME
TOPICAL | Status: DC | PRN
  Administered 2023-12-23: 1

## 2023-12-23 MED ORDER — LACTATED RINGERS IV SOLN: INTRAVENOUS | Status: DC

## 2023-12-23 MED ORDER — MIDAZOLAM HCL 2 MG/2ML IJ SOLN
INTRAMUSCULAR | Status: AC
  Filled 2023-12-23: qty 2

## 2023-12-23 MED ORDER — SPIRONOLACTONE 25 MG PO TABS: 25.0000 mg | ORAL_TABLET | Freq: Every day | ORAL | Status: DC

## 2023-12-23 MED ORDER — BUPIVACAINE IN DEXTROSE 0.75-8.25 % IT SOLN
INTRATHECAL | Status: DC | PRN
  Administered 2023-12-23: 2 mL via INTRATHECAL

## 2023-12-23 MED ORDER — PROPOFOL 10 MG/ML IV BOLUS
INTRAVENOUS | Status: DC | PRN
  Administered 2023-12-23: 30 mg via INTRAVENOUS
  Administered 2023-12-23: 20 mg via INTRAVENOUS

## 2023-12-23 MED ORDER — ONDANSETRON HCL 4 MG/2ML IJ SOLN
INTRAMUSCULAR | Status: DC | PRN
  Administered 2023-12-23: 4 mg via INTRAVENOUS

## 2023-12-23 MED ORDER — INSULIN ASPART 100 UNIT/ML IJ SOLN: 0.0000 [IU] | INTRAMUSCULAR | Status: DC | PRN

## 2023-12-23 MED ORDER — METHOCARBAMOL 500 MG PO TABS
500.0000 mg | ORAL_TABLET | Freq: Four times a day (QID) | ORAL | Status: DC | PRN
  Administered 2023-12-23 – 2023-12-24 (×4): 500 mg via ORAL
  Filled 2023-12-23 (×4): qty 1

## 2023-12-23 MED ORDER — KETOROLAC TROMETHAMINE 15 MG/ML IJ SOLN
7.5000 mg | Freq: Four times a day (QID) | INTRAMUSCULAR | Status: AC
  Administered 2023-12-23 – 2023-12-24 (×4): 7.5 mg via INTRAVENOUS
  Filled 2023-12-23 (×4): qty 1

## 2023-12-23 MED ORDER — INSULIN ASPART 100 UNIT/ML IJ SOLN
0.0000 [IU] | Freq: Three times a day (TID) | INTRAMUSCULAR | Status: DC
  Administered 2023-12-23 – 2023-12-24 (×3): 3 [IU] via SUBCUTANEOUS
  Filled 2023-12-23 (×3): qty 3

## 2023-12-23 MED ORDER — TRANEXAMIC ACID-NACL 1000-0.7 MG/100ML-% IV SOLN
1000.0000 mg | Freq: Once | INTRAVENOUS | Status: AC
  Administered 2023-12-23: 1000 mg via INTRAVENOUS
  Filled 2023-12-23: qty 100

## 2023-12-23 MED ORDER — APIXABAN 2.5 MG PO TABS
ORAL_TABLET | ORAL | 0 refills | Status: AC
  Filled 2023-12-23: qty 60, 30d supply, fill #0

## 2023-12-23 MED ORDER — METOCLOPRAMIDE HCL 5 MG PO TABS
5.0000 mg | ORAL_TABLET | Freq: Three times a day (TID) | ORAL | Status: DC | PRN
  Filled 2023-12-23: qty 2

## 2023-12-23 MED ORDER — CHLORHEXIDINE GLUCONATE 4 % EX SOLN: 1.0000 | CUTANEOUS | 1 refills | Status: AC

## 2023-12-23 MED ORDER — INSULIN ASPART 100 UNIT/ML IJ SOLN: 0.0000 [IU] | Freq: Every day | INTRAMUSCULAR | Status: DC

## 2023-12-23 MED ORDER — ORAL CARE MOUTH RINSE: 15.0000 mL | Freq: Once | OROMUCOSAL | Status: AC

## 2023-12-23 MED ORDER — MUPIROCIN 2 % EX OINT: 1.0000 | TOPICAL_OINTMENT | Freq: Two times a day (BID) | CUTANEOUS | 0 refills | Status: AC

## 2023-12-23 MED ORDER — PROPOFOL 10 MG/ML IV BOLUS
INTRAVENOUS | Status: AC
  Filled 2023-12-23: qty 20

## 2023-12-23 MED ORDER — OXYCODONE HCL 5 MG PO TABS
5.0000 mg | ORAL_TABLET | Freq: Four times a day (QID) | ORAL | Status: DC | PRN
  Administered 2023-12-23 – 2023-12-24 (×5): 5 mg via ORAL
  Filled 2023-12-23 (×4): qty 1

## 2023-12-23 MED ORDER — BUPIVACAINE-MELOXICAM ER 400-12 MG/14ML IJ SOLN
INTRAMUSCULAR | Status: DC | PRN
  Administered 2023-12-23: 400 mg

## 2023-12-23 MED ORDER — DOCUSATE SODIUM 100 MG PO CAPS
100.0000 mg | ORAL_CAPSULE | Freq: Two times a day (BID) | ORAL | Status: DC
  Administered 2023-12-23: 100 mg via ORAL
  Filled 2023-12-23: qty 1

## 2023-12-23 MED ORDER — OXYCODONE HCL 5 MG/5ML PO SOLN: 5.0000 mg | Freq: Once | ORAL | Status: DC | PRN

## 2023-12-23 MED ORDER — VANCOMYCIN HCL 1000 MG IV SOLR
INTRAVENOUS | Status: DC | PRN
  Administered 2023-12-23: 1000 mg

## 2023-12-23 MED ORDER — CEFAZOLIN SODIUM-DEXTROSE 2-4 GM/100ML-% IV SOLN
2.0000 g | INTRAVENOUS | Status: AC
  Administered 2023-12-23: 2 g via INTRAVENOUS
  Filled 2023-12-23: qty 100

## 2023-12-23 MED ORDER — TRANEXAMIC ACID-NACL 1000-0.7 MG/100ML-% IV SOLN
INTRAVENOUS | Status: AC
  Filled 2023-12-23: qty 400

## 2023-12-23 MED ORDER — ACETAMINOPHEN 500 MG PO TABS
1000.0000 mg | ORAL_TABLET | Freq: Four times a day (QID) | ORAL | Status: AC
  Administered 2023-12-23 – 2023-12-24 (×4): 1000 mg via ORAL
  Filled 2023-12-23 (×4): qty 2

## 2023-12-23 MED ORDER — INSULIN ASPART 100 UNIT/ML IJ SOLN
INTRAMUSCULAR | Status: AC
  Administered 2023-12-23: 2 [IU] via SUBCUTANEOUS
  Filled 2023-12-23: qty 2

## 2023-12-23 MED ORDER — HYDROMORPHONE HCL 1 MG/ML IJ SOLN: 0.2500 mg | INTRAMUSCULAR | Status: DC | PRN

## 2023-12-23 MED ORDER — POVIDONE-IODINE 10 % EX SWAB
2.0000 | Freq: Once | CUTANEOUS | Status: AC
  Administered 2023-12-23: 2 via TOPICAL

## 2023-12-23 MED ORDER — TRANEXAMIC ACID-NACL 1000-0.7 MG/100ML-% IV SOLN
1000.0000 mg | INTRAVENOUS | Status: AC
  Administered 2023-12-23: 1000 mg via INTRAVENOUS
  Filled 2023-12-23: qty 100

## 2023-12-23 MED ORDER — DEXAMETHASONE SOD PHOSPHATE PF 10 MG/ML IJ SOLN
10.0000 mg | Freq: Once | INTRAMUSCULAR | Status: AC
  Administered 2023-12-24: 10 mg via INTRAVENOUS

## 2023-12-23 MED ORDER — ONDANSETRON HCL 4 MG/2ML IJ SOLN
4.0000 mg | Freq: Four times a day (QID) | INTRAMUSCULAR | Status: DC | PRN
  Administered 2023-12-23 – 2023-12-24 (×2): 4 mg via INTRAVENOUS
  Filled 2023-12-23 (×2): qty 2

## 2023-12-23 SURGICAL SUPPLY — 64 items
ALCOHOL 70% 16 OZ (MISCELLANEOUS) ×2 IMPLANT
BAG COUNTER SPONGE SURGICOUNT (BAG) ×2 IMPLANT
BAG DECANTER FOR FLEXI CONT (MISCELLANEOUS) ×2 IMPLANT
BLADE SAG 18X100X1.27 (BLADE) ×2 IMPLANT
BLADE SAW SAG 90X13X1.27 (BLADE) ×2 IMPLANT
BLADE SAW SGTL 73X25 THK (BLADE) ×2 IMPLANT
BNDG COMPR ESMARK 6X3 LF (GAUZE/BANDAGES/DRESSINGS) IMPLANT
BOWL SMART MIX CTS (DISPOSABLE) IMPLANT
CLSR STERI-STRIP ANTIMIC 1/2X4 (GAUZE/BANDAGES/DRESSINGS) IMPLANT
COMPONENT FEM KNEE STD PS 5 RT (Joint) IMPLANT
COMPONENT PATELLA PEG 3 32 (Joint) IMPLANT
COOLER ICEMAN CLASSIC (MISCELLANEOUS) ×2 IMPLANT
COVER SURGICAL LIGHT HANDLE (MISCELLANEOUS) ×2 IMPLANT
CUFF TOURN SGL QUICK 42 (TOURNIQUET CUFF) IMPLANT
CUFF TRNQT CYL 34X4.125X (TOURNIQUET CUFF) ×2 IMPLANT
DERMABOND ADVANCED .7 DNX12 (GAUZE/BANDAGES/DRESSINGS) ×2 IMPLANT
DRAPE EXTREMITY T 121X128X90 (DISPOSABLE) ×2 IMPLANT
DRAPE HALF SHEET 40X57 (DRAPES) ×2 IMPLANT
DRAPE INCISE IOBAN 66X45 STRL (DRAPES) ×2 IMPLANT
DRAPE POUCH INSTRU U-SHP 10X18 (DRAPES) ×2 IMPLANT
DRAPE SURG ORHT 6 SPLT 77X108 (DRAPES) IMPLANT
DRAPE U-SHAPE 47X51 STRL (DRAPES) ×4 IMPLANT
DRSG AQUACEL AG ADV 3.5X10 (GAUZE/BANDAGES/DRESSINGS) ×2 IMPLANT
DURAPREP 26ML APPLICATOR (WOUND CARE) ×6 IMPLANT
ELECT CAUTERY BLADE 6.4 (BLADE) ×2 IMPLANT
ELECT PENCIL ROCKER SW 15FT (MISCELLANEOUS) ×2 IMPLANT
ELECTRODE REM PT RTRN 9FT ADLT (ELECTROSURGICAL) ×2 IMPLANT
GLOVE BIOGEL PI IND STRL 7.5 (GLOVE) ×2 IMPLANT
GLOVE BIOGEL PI MICRO STRL 7 (GLOVE) ×10 IMPLANT
GLOVE INDICATOR 7.0 STRL GRN (GLOVE) ×2 IMPLANT
GLOVE SURG SYN 7.5 PF PI (GLOVE) ×10 IMPLANT
GOWN STRL REUS W/ TWL LRG LVL3 (GOWN DISPOSABLE) ×4 IMPLANT
GOWN TOGA ZIPPER T7+ PEEL AWAY (MISCELLANEOUS) ×2 IMPLANT
HOOD PEEL AWAY T7 (MISCELLANEOUS) ×2 IMPLANT
INSERT TIB ASF 11 4-5/CD RT (Insert) IMPLANT
IV 0.9% NACL 1000 ML (IV SOLUTION) ×2 IMPLANT
KIT BASIN OR (CUSTOM PROCEDURE TRAY) ×2 IMPLANT
KIT TURNOVER KIT B (KITS) ×2 IMPLANT
MANIFOLD NEPTUNE II (INSTRUMENTS) ×2 IMPLANT
MARKER SKIN DUAL TIP RULER LAB (MISCELLANEOUS) ×4 IMPLANT
NDL SPNL 18GX3.5 QUINCKE PK (NEEDLE) ×2 IMPLANT
PACK TOTAL JOINT (CUSTOM PROCEDURE TRAY) ×2 IMPLANT
PAD ARMBOARD POSITIONER FOAM (MISCELLANEOUS) ×4 IMPLANT
PAD COLD SHLDR WRAP-ON (PAD) ×2 IMPLANT
PIN DRILL HDLS TROCAR 75 4PK (PIN) IMPLANT
SCREW FEMALE HEX FIX 25X2.5 (ORTHOPEDIC DISPOSABLE SUPPLIES) IMPLANT
SET HNDPC FAN SPRY TIP SCT (DISPOSABLE) ×2 IMPLANT
SOLN 0.9% NACL POUR BTL 1000ML (IV SOLUTION) ×2 IMPLANT
SOLUTION PRONTOSAN WOUND 350ML (IRRIGATION / IRRIGATOR) ×2 IMPLANT
STAPLER SKIN PROX 35W (STAPLE) IMPLANT
STEM TIB PS KNEE D 0D RT (Stem) IMPLANT
SUCTION TUBE FRAZIER 10FR DISP (SUCTIONS) IMPLANT
SUT ETHILON 2 0 FS 18 (SUTURE) ×4 IMPLANT
SUT STRATAFIX PDS+ 0 24IN (SUTURE) ×2 IMPLANT
SUT VIC AB 0 CT1 27XBRD ANBCTR (SUTURE) ×2 IMPLANT
SUT VIC AB 1 CT1 27XBRD ANBCTR (SUTURE) IMPLANT
SUT VIC AB 1 CTX 27 (SUTURE) IMPLANT
SUT VIC AB 2-0 CT1 TAPERPNT 27 (SUTURE) ×6 IMPLANT
SYR 30ML LL (SYRINGE) ×4 IMPLANT
TOWEL GREEN STERILE (TOWEL DISPOSABLE) ×2 IMPLANT
TOWEL GREEN STERILE FF (TOWEL DISPOSABLE) ×2 IMPLANT
TUBE SUCT ARGYLE STRL (TUBING) ×2 IMPLANT
UNDERPAD 30X36 HEAVY ABSORB (UNDERPADS AND DIAPERS) ×2 IMPLANT
YANKAUER SUCT BULB TIP NO VENT (SUCTIONS) ×2 IMPLANT

## 2023-12-23 NOTE — Discharge Instructions (Addendum)
 Information on my medicine - ELIQUIS (apixaban)  This medication education was reviewed with me or my healthcare representative as part of my discharge preparation.   Why was Eliquis prescribed for you? Eliquis was prescribed for you to reduce the risk of blood clots forming after orthopedic surgery.    What do You need to know about Eliquis? Take your Eliquis TWICE DAILY - one tablet in the morning and one tablet in the evening with or without food.  It would be best to take the dose about the same time each day.  If you have difficulty swallowing the tablet whole please discuss with your pharmacist how to take the medication safely.  Take Eliquis exactly as prescribed by your doctor and DO NOT stop taking Eliquis without talking to the doctor who prescribed the medication.  Stopping without other medication to take the place of Eliquis may increase your risk of developing a clot.  After discharge, you should have regular check-up appointments with your healthcare provider that is prescribing your Eliquis.  What do you do if you miss a dose? If a dose of ELIQUIS is not taken at the scheduled time, take it as soon as possible on the same day and twice-daily administration should be resumed.  The dose should not be doubled to make up for a missed dose.  Do not take more than one tablet of ELIQUIS at the same time.  Important Safety Information A possible side effect of Eliquis is bleeding. You should call your healthcare provider right away if you experience any of the following: Bleeding from an injury or your nose that does not stop. Unusual colored urine (red or dark brown) or unusual colored stools (red or black). Unusual bruising for unknown reasons. A serious fall or if you hit your head (even if there is no bleeding).  Some medicines may interact with Eliquis and might increase your risk of bleeding or clotting while on Eliquis. To help avoid this, consult your healthcare  provider or pharmacist prior to using any new prescription or non-prescription medications, including herbals, vitamins, non-steroidal anti-inflammatory drugs (NSAIDs) and supplements.  This website has more information on Eliquis (apixaban): http://www.eliquis.com/eliquis/home    INSTRUCTIONS AFTER JOINT REPLACEMENT   Remove items at home which could result in a fall. This includes throw rugs or furniture in walking pathways ICE to the affected joint every three hours while awake for 30 minutes at a time, for at least the first 3-5 days, and then as needed for pain and swelling.  Continue to use ice for pain and swelling. You may notice swelling that will progress down to the foot and ankle.  This is normal after surgery.  Elevate your leg when you are not up walking on it.   Continue to use the breathing machine you got in the hospital (incentive spirometer) which will help keep your temperature down.  It is common for your temperature to cycle up and down following surgery, especially at night when you are not up moving around and exerting yourself.  The breathing machine keeps your lungs expanded and your temperature down.   DIET:  As you were doing prior to hospitalization, we recommend a well-balanced diet.  DRESSING / WOUND CARE / SHOWERING  Keep the surgical dressing until follow up.  The dressing is water proof, so you can shower without any extra covering.  IF THE DRESSING FALLS OFF or the wound gets wet inside, change the dressing with sterile gauze.  Please use good  hand washing techniques before changing the dressing.  Do not use any lotions or creams on the incision until instructed by your surgeon.    ACTIVITY  Increase activity slowly as tolerated, but follow the weight bearing instructions below.   No driving for 6 weeks or until further direction given by your physician.  You cannot drive while taking narcotics.  No lifting or carrying greater than 10 lbs. until further  directed by your surgeon. Avoid periods of inactivity such as sitting longer than an hour when not asleep. This helps prevent blood clots.  You may return to work once you are authorized by your doctor.     WEIGHT BEARING   Weight bearing as tolerated with assist device (walker, cane, etc) as directed, use it as long as suggested by your surgeon or therapist, typically at least 4-6 weeks.   EXERCISES  Results after joint replacement surgery are often greatly improved when you follow the exercise, range of motion and muscle strengthening exercises prescribed by your doctor. Safety measures are also important to protect the joint from further injury. Any time any of these exercises cause you to have increased pain or swelling, decrease what you are doing until you are comfortable again and then slowly increase them. If you have problems or questions, call your caregiver or physical therapist for advice.   Rehabilitation is important following a joint replacement. After just a few days of immobilization, the muscles of the leg can become weakened and shrink (atrophy).  These exercises are designed to build up the tone and strength of the thigh and leg muscles and to improve motion. Often times heat used for twenty to thirty minutes before working out will loosen up your tissues and help with improving the range of motion but do not use heat for the first two weeks following surgery (sometimes heat can increase post-operative swelling).   These exercises can be done on a training (exercise) mat, on the floor, on a table or on a bed. Use whatever works the best and is most comfortable for you.    Use music or television while you are exercising so that the exercises are a pleasant break in your day. This will make your life better with the exercises acting as a break in your routine that you can look forward to.   Perform all exercises about fifteen times, three times per day or as directed.  You should  exercise both the operative leg and the other leg as well.  Exercises include:   Quad Sets - Tighten up the muscle on the front of the thigh (Quad) and hold for 5-10 seconds.   Straight Leg Raises - With your knee straight (if you were given a brace, keep it on), lift the leg to 60 degrees, hold for 3 seconds, and slowly lower the leg.  Perform this exercise against resistance later as your leg gets stronger.  Leg Slides: Lying on your back, slowly slide your foot toward your buttocks, bending your knee up off the floor (only go as far as is comfortable). Then slowly slide your foot back down until your leg is flat on the floor again.  Angel Wings: Lying on your back spread your legs to the side as far apart as you can without causing discomfort.  Hamstring Strength:  Lying on your back, push your heel against the floor with your leg straight by tightening up the muscles of your buttocks.  Repeat, but this time bend your knee to  a comfortable angle, and push your heel against the floor.  You may put a pillow under the heel to make it more comfortable if necessary.   A rehabilitation program following joint replacement surgery can speed recovery and prevent re-injury in the future due to weakened muscles. Contact your doctor or a physical therapist for more information on knee rehabilitation.    CONSTIPATION  Constipation is defined medically as fewer than three stools per week and severe constipation as less than one stool per week.  Even if you have a regular bowel pattern at home, your normal regimen is likely to be disrupted due to multiple reasons following surgery.  Combination of anesthesia, postoperative narcotics, change in appetite and fluid intake all can affect your bowels.   YOU MUST use at least one of the following options; they are listed in order of increasing strength to get the job done.  They are all available over the counter, and you may need to use some, POSSIBLY even all of  these options:    Drink plenty of fluids (prune juice may be helpful) and high fiber foods Colace 100 mg by mouth twice a day  Senokot for constipation as directed and as needed Dulcolax (bisacodyl), take with full glass of water  Miralax  (polyethylene glycol) once or twice a day as needed.  If you have tried all these things and are unable to have a bowel movement in the first 3-4 days after surgery call either your surgeon or your primary doctor.    If you experience loose stools or diarrhea, hold the medications until you stool forms back up.  If your symptoms do not get better within 1 week or if they get worse, check with your doctor.  If you experience the worst abdominal pain ever or develop nausea or vomiting, please contact the office immediately for further recommendations for treatment.   ITCHING:  If you experience itching with your medications, try taking only a single pain pill, or even half a pain pill at a time.  You can also use Benadryl  over the counter for itching or also to help with sleep.   TED HOSE STOCKINGS:  Use stockings on both legs until for at least 2 weeks or as directed by physician office. They may be removed at night for sleeping.  MEDICATIONS:  See your medication summary on the "After Visit Summary" that nursing will review with you.  You may have some home medications which will be placed on hold until you complete the course of blood thinner medication.  It is important for you to complete the blood thinner medication as prescribed.  PRECAUTIONS:  If you experience chest pain or shortness of breath - call 911 immediately for transfer to the hospital emergency department.   If you develop a fever greater that 101 F, purulent drainage from wound, increased redness or drainage from wound, foul odor from the wound/dressing, or calf pain - CONTACT YOUR SURGEON.                                                   FOLLOW-UP APPOINTMENTS:  If you do not already have  a post-op appointment, please call the office for an appointment to be seen by your surgeon.  Guidelines for how soon to be seen are listed in your "After Visit Summary", but are  typically between 1-4 weeks after surgery.  OTHER INSTRUCTIONS:   Knee Replacement:  Do not place pillow under knee, focus on keeping the knee straight while resting. CPM instructions: 0-90 degrees, 2 hours in the morning, 2 hours in the afternoon, and 2 hours in the evening. Place foam block, curve side up under heel at all times except when in CPM or when walking.  DO NOT modify, tear, cut, or change the foam block in any way.  POST-OPERATIVE OPIOID TAPER INSTRUCTIONS: It is important to wean off of your opioid medication as soon as possible. If you do not need pain medication after your surgery it is ok to stop day one. Opioids include: Codeine , Hydrocodone (Norco, Vicodin), Oxycodone (Percocet, oxycontin ) and hydromorphone amongst others.  Long term and even short term use of opiods can cause: Increased pain response Dependence Constipation Depression Respiratory depression And more.  Withdrawal symptoms can include Flu like symptoms Nausea, vomiting And more Techniques to manage these symptoms Hydrate well Eat regular healthy meals Stay active Use relaxation techniques(deep breathing, meditating, yoga) Do Not substitute Alcohol to help with tapering If you have been on opioids for less than two weeks and do not have pain than it is ok to stop all together.  Plan to wean off of opioids This plan should start within one week post op of your joint replacement. Maintain the same interval or time between taking each dose and first decrease the dose.  Cut the total daily intake of opioids by one tablet each day Next start to increase the time between doses. The last dose that should be eliminated is the evening dose.   Per [facility] clinic policy, our goal is ensure optimal postoperative pain control with a  multimodal pain management strategy. For all [facility] patients, our goal is to wean post-operative narcotic medications by 6 weeks post-operatively. If this is not possible due to utilization of pain medication prior to surgery, your [facility] doctor will support your acute post-operative pain control for the first 6 weeks postoperatively, with a plan to transition you back to your primary pain team following that. Maralee will work to ensure a therapist, occupational.   MAKE SURE YOU:  Understand these instructions.  Get help right away if you are not doing well or get worse.    Thank you for letting us  be a part of your medical care team.  It is a privilege we respect greatly.  We hope these instructions will help you stay on track for a fast and full recovery!

## 2023-12-23 NOTE — Anesthesia Procedure Notes (Signed)
 Anesthesia Regional Block: Adductor canal block   Pre-Anesthetic Checklist: , timeout performed,  Correct Patient, Correct Site, Correct Laterality,  Correct Procedure, Correct Position, site marked,  Risks and benefits discussed,  Surgical consent,  Pre-op evaluation,  At surgeon's request and post-op pain management  Laterality: Right  Prep: chloraprep       Needles:  Injection technique: Single-shot  Needle Type: Stimiplex     Needle Length: 9cm  Needle Gauge: 21     Additional Needles:   Procedures:,,,, ultrasound used (permanent image in chart),,    Narrative:  Start time: 12/23/2023 6:54 AM End time: 12/23/2023 6:59 AM Injection made incrementally with aspirations every 5 mL.  Performed by: Personally  Anesthesiologist: Cleotilde Butler Dade, MD

## 2023-12-23 NOTE — Telephone Encounter (Signed)
 Patient Product/process Development Scientist completed.    The patient is insured through [facility]. Patient has [employer], may use a copay card, and/or apply for patient assistance if available.    Ran test claim for Eliquis 2.5 mg and the current 30 day co-pay is $50.00.   This test claim was processed through [facility]- copay amounts may vary at other pharmacies due to pharmacy/plan contracts, or as the patient moves through the different stages of their insurance plan.     Reyes Sharps, CPHT Pharmacy Technician Patient Advocate Specialist Lead [facility] Patient Advocate Team Direct Number: [phone]  Fax: [phone]

## 2023-12-23 NOTE — H&P (Signed)
 PREOPERATIVE H&P  Chief Complaint: right knee osteoarthritis  HPI: Sandra [name] is a 58 y.o. female who presents for surgical treatment of right knee osteoarthritis.  She denies any changes in medical history.  Past Surgical History:  Procedure Laterality Date  . CHOLECYSTECTOMY  2009  . DILATATION & CURETTAGE/HYSTEROSCOPY WITH MYOSURE N/A 12/25/2017   Procedure: DILATATION & CURETTAGE/HYSTEROSCOPY   POLYPECTOMY WITH MYOSURE;  Surgeon: Linnell Devere BRAVO, MD;  Location: [facility];  Service: Gynecology;  Laterality: N/A;  . TUBAL LIGATION     Social History   Socioeconomic History  . Marital status: Married    Spouse name: Not on file  . Number of children: Not on file  . Years of education: Not on file  . Highest education level: Not on file  Occupational History  . Occupation: Building surveyor  Tobacco Use  . Smoking status: Never  . Smokeless tobacco: Never  Vaping Use  . Vaping status: Never Used  Substance and Sexual Activity  . Alcohol use: Yes    Alcohol/week: 0.0 standard drinks of alcohol    Comment: occ  . Drug use: No  . Sexual activity: Yes    Birth control/protection: Surgical  Other Topics Concern  . Not on file  Social History Narrative   Studying at [employer] in early childhood education.   Married.   Social Drivers of Corporate Investment Banker Strain: Low Risk  (01/31/2022)   Overall Financial Resource Strain (CARDIA)   . Difficulty of Paying Living Expenses: Not hard at all  Food Insecurity: No Food Insecurity (03/27/2023)   Hunger Vital Sign   . Worried About Programme Researcher, Broadcasting/film/video in the Last Year: Never true   . Ran Out of Food in the Last Year: Never true  Transportation Needs: No Transportation Needs (03/27/2023)   PRAPARE - Transportation   . Lack of Transportation (Medical): No   . Lack of Transportation (Non-Medical): No  Physical Activity: Not on file  Stress: No Stress Concern Present (01/31/2022)   [employer] of Occupational Health -  Occupational Stress Questionnaire   . Feeling of Stress : Not at all  Social Connections: Moderately Integrated (03/27/2023)   Social Connection and Isolation Panel   . Frequency of Communication with Friends and Family: More than three times a week   . Frequency of Social Gatherings with Friends and Family: More than three times a week   . Attends Religious Services: More than 4 times per year   . Active Member of Clubs or Organizations: No   . Attends Banker Meetings: Never   . Marital Status: Married   Family History  Problem Relation Age of Onset  . Hypertension Father   . Diabetes Father    Allergies  Allergen Reactions  . Drug Class [Trazodone  And Nefazodone]     Developed suicidal ideation within 3 days of starting  . Sertraline  Hcl     Developed suicidal ideation within 3d of starting   Prior to Admission medications   Medication Sig Start Date End Date Taking? Authorizing Provider  aspirin  EC 81 MG tablet Take 1 tablet (81 mg total) by mouth daily. Swallow whole. 02/23/21  Yes Atway, Rayann N, DO  atorvastatin  (LIPITOR ) 80 MG tablet Take 1 tablet (80 mg total) by mouth daily. 09/18/23  Yes Azadegan, Maryam, MD  budesonide -formoterol  (SYMBICORT ) 160-4.5 MCG/ACT inhaler Inhale 1 puff into the lungs 2 (two) times daily as needed. 08/15/23  Yes Azadegan, Maryam, MD  clobetasol (  TEMOVATE) 0.05 % external solution Apply 1 Application topically daily. 10/10/23  Yes [provider]  dapagliflozin  propanediol (FARXIGA ) 10 MG TABS tablet Take 1 tablet (10 mg total) by mouth daily. 10/01/23  Yes Weaver, Scott T, PA-C  Dulaglutide  (TRULICITY ) 4.5 MG/0.5ML SOAJ Inject 4.5 mg into the skin once a week. 08/28/23  Yes Celestina Czar, MD  escitalopram  (LEXAPRO ) 10 MG tablet Take 1 tablet (10 mg total) by mouth daily. 09/28/23  Yes Azadegan, Maryam, MD  finasteride (PROSCAR) 5 MG tablet Take 2.5 mg by mouth daily. 07/27/23  Yes [provider]  fluticasone  (FLONASE ) 50  MCG/ACT nasal spray Place 1-2 sprays into both nostrils daily as needed for allergies or rhinitis. 08/15/23  Yes Azadegan, Maryam, MD  hydrALAZINE  (APRESOLINE ) 25 MG tablet Take 1 tablet (25 mg total) by mouth 3 (three) times daily. 06/28/23 12/10/23 Yes Weaver, Scott T, PA-C  Insulin  Glargine (BASAGLAR  KWIKPEN) 100 UNIT/ML Inject 26 Units into the skin at bedtime. 03/26/23  Yes Atway, Rayann N, DO  metFORMIN  (GLUCOPHAGE ) 1000 MG tablet Take 1 tablet (1,000 mg total) by mouth 2 (two) times daily with a meal. 03/26/23  Yes Atway, Rayann N, DO  methocarbamol  (ROBAXIN ) 750 MG tablet Take 1 tablet (750 mg total) by mouth 3 (three) times daily as needed. 12/09/23  Yes Jule Ronal CROME, PA-C  metoprolol  (TOPROL -XL) 200 MG 24 hr tablet Take 1 tablet (200 mg total) by mouth daily. 03/26/23  Yes Atway, Rayann N, DO  sacubitril -valsartan  (ENTRESTO ) 97-103 MG Take 1 tablet by mouth twice daily 10/15/23  Yes Nishan, Peter C, MD  spironolactone  (ALDACTONE ) 25 MG tablet Take 1 tablet (25 mg total) by mouth daily. 03/26/23  Yes Atway, Rayann N, DO  Continuous Glucose Sensor (DEXCOM G7 SENSOR) MISC Place new sensor every 10 days. Use to monitor blood sugar continuously. 04/15/23   Masters, Katie, DO  Dextromethorphan  HBr 15 MG/15ML LIQD Take 10 mLs (10 mg total) by mouth in the morning, at noon, and at bedtime. Patient not taking: Reported on 12/10/2023 08/15/23   Azadegan, Maryam, MD  docusate sodium  (COLACE) 100 MG capsule Take 1 capsule (100 mg total) by mouth daily as needed. 12/09/23 12/08/24  Jule Ronal CROME, PA-C  ondansetron  (ZOFRAN ) 4 MG tablet Take 1 tablet (4 mg total) by mouth every 8 (eight) hours as needed for nausea or vomiting. 12/09/23   Jule Ronal CROME, PA-C  oxyCODONE -acetaminophen  (PERCOCET) 5-325 MG tablet Take 1-2 tablets by mouth every 8 (eight) hours as needed. To be taken after surgery 12/09/23   Jule Ronal CROME, PA-C     Positive ROS: All other systems have been reviewed and were otherwise negative  with the exception of those mentioned in the HPI and as above.  Physical Exam: General: Alert, no acute distress Cardiovascular: No pedal edema Respiratory: No cyanosis, no use of accessory musculature GI: abdomen soft Skin: No lesions in the area of chief complaint Neurologic: Sensation intact distally Psychiatric: Patient is competent for consent with normal mood and affect Lymphatic: no lymphedema  MUSCULOSKELETAL: exam stable  Assessment: right knee osteoarthritis  Plan: Plan for Procedure(s): ARTHROPLASTY, KNEE, TOTAL  The risks benefits and alternatives were discussed with the patient including but not limited to the risks of nonoperative treatment, versus surgical intervention including infection, bleeding, nerve injury,  blood clots, cardiopulmonary complications, morbidity, mortality, among others, and they were willing to proceed.   Ozell Cummins, MD 12/23/2023 5:50 AM

## 2023-12-23 NOTE — Telephone Encounter (Signed)
 Medication sent to pharmacy

## 2023-12-23 NOTE — Op Note (Signed)
 Total Knee Arthroplasty Procedure Note  Preoperative diagnosis: Right knee osteoarthritis  Postoperative diagnosis:same  Operative findings: Complete loss of articular cartilage from all 3 compartments Varus deformity  Operative procedure: Right total knee arthroplasty. CPT [id number]  Surgeon: N. Ozell Cummins, MD  Assist: Eva Staple, RNFA  Anesthesia: Spinal, regional  Tourniquet time: see anesthesia record  Implants used: Zimmer persona press fit Femur: CR 5 Tibia: D Patella: 32 mm Polyethylene: 11 mm medial congruent  Indication: Sandra [name] is a 58 y.o. year old female with a history of knee pain. Having failed conservative management, the patient elected to proceed with a total knee arthroplasty.  We have reviewed the risk and benefits of the surgery and they elected to proceed after voicing understanding.  Procedure:  After informed consent was obtained and understanding of the risk were voiced including but not limited to bleeding, infection, damage to surrounding structures including nerves and vessels, blood clots, leg length inequality and the failure to achieve desired results, the operative extremity was marked with verbal confirmation of the patient in the holding area.   The patient was then brought to the operating room and transported to the operating room table in the supine position.  A tourniquet was applied to the operative extremity around the upper thigh. The operative limb was then prepped and draped in the usual sterile fashion and preoperative antibiotics were administered.  A time out was performed prior to the start of surgery confirming the correct extremity, preoperative antibiotic administration, as well as team members, implants and instruments available for the case. Correct surgical site was also confirmed with preoperative radiographs. The limb was then elevated for exsanguination and the tourniquet was inflated. A midline incision was made and a  standard medial parapatellar approach was performed.  The patella was everted which showed complete loss of articular cartilage.  The patella was resected down to 12 mm and sized to a 32 mm.  A cover was placed on the patella for protection from retractors.  We then turned our attention to the femur.  The ACL was sacrificed. Start site was drilled in the femur and the intramedullary distal femoral cutting guide was placed, set at 5 degrees valgus, taking 10 mm of distal resection. The distal cut was made. Osteophytes were then removed.   Next, the proximal tibial cutting guide was placed with appropriate slope, varus/valgus alignment and depth of resection. A drop rod was attached to confirm that it was pointed to the second metatarsal.  The proximal tibial cut was made taking 2 mm off the low side. Gap blocks were then used to assess the extension gap and alignment, and appropriate soft tissue releases were performed. Attention was turned back to the femur, which was sized using the sizing guide to a size 5 standard. Appropriate rotation of the femoral component was determined using epicondylar axis, Whiteside's line, and assessing the flexion gap under ligament tension. The appropriate size 4-in-1 cutting block was placed and cuts were made.  Posterior femoral osteophytes and uncapped bone were then removed with the curved osteotome.  Trial components were placed, and stability was checked in full extension, mid-flexion, and deep flexion.  The PCL was resected.  The patella tracked well without a lateral release. Trial components were then removed and tibial preparation performed.  The tibial trial was pointed to the medial third of the tibial tubercle.  The tibia was sized for a size D component and prepped.  Trial components were removed.  The bony surfaces were irrigated with a pulse lavage and then dried. Final components were placed.  The final polyethylene liner, 11 mm thick, was inserted and checked to  ensure the locking mechanism had engaged appropriately.  The stability of the construct was re-evaluated throughout a range of motion and found to be acceptable.  The tourniquet was deflated and hemostasis was achieved. The wound was irrigated with normal saline.  One gram of vancomycin  powder was placed in the surgical bed.  Topical mixture of 0.25% bupivacaine  and meloxicam  was placed in the joint for postoperative pain.  Capsular closure was performed with a #1 statafix in flexion, subcutaneous fat closed with a 0 vicryl suture, then subcutaneous tissue closed with interrupted 2.0 vicryl suture. The skin was then closed with a 2.0 nylon and dermabond. A sterile dressing was applied.  The patient was awakened in the operating room and taken to recovery in stable condition. All sponge, needle, and instrument counts were correct at the end of the case.  Position: supine  Complications: none.  Time Out: performed  Drains/Packing: none Estimated blood loss: minimal Returned to Recovery Room: in good condition.   Mechanical VTE (DVT) Prophylaxis: sequential compression devices, TED thigh-high  Chemical VTE (DVT) Prophylaxis: eliquis  POD 1  Fluid Replacement  Crystalloid: see anesthesia record Blood: none  FFP: none   Specimens Removed: 1 to pathology  Sponge and Instrument Count Correct? yes  PACU: portable radiograph - knee AP and Lateral  Plan/RTC: Return in 2 weeks for suture removal.  Weight Bearing/Load Lower Extremity: full   Implant Name Type Inv. Item Serial No. Manufacturer Lot No. LRB No. Used Action  COMPONENT PATELLA PEG 3 32 - [id number] Joint COMPONENT PATELLA PEG 3 32  [employer](ORTH,TRAU,BIO,SG) [id number] Right 1 Implanted  STEM TIB PS KNEE D 0D RT - [id number] Stem STEM TIB PS KNEE D 0D RT  [employer](ORTH,TRAU,BIO,SG) [id number] Right 1 Implanted  INSERT TIB ASF 11 4-5/CD RT - [id number] Insert INSERT TIB ASF 11 4-5/CD RT  [employer](ORTH,TRAU,BIO,SG) [id number] Right 1  Implanted  COMPONENT FEM KNEE STD PS 5 RT - [id number] Joint COMPONENT FEM KNEE STD PS 5 RT  [employer](ORTH,TRAU,BIO,SG) [id number] Right 1 Implanted    N. Ozell Cummins, MD [facility] 8:46 AM

## 2023-12-23 NOTE — Progress Notes (Signed)
 Orthopedic Tech Progress Note Patient Details:  Sandra [name] May 02, 1965 [id number]  Ortho Devices Type of Ortho Device: Bone foam zero knee Ortho Device/Splint Location: RLE Ortho Device/Splint Interventions: Ordered, Application  applied while in recovery    Post Interventions Patient Tolerated: Well Instructions Provided: Care of device  Delanna LITTIE Pac 12/23/2023, 9:46 AM

## 2023-12-23 NOTE — Evaluation (Signed)
 Physical Therapy Evaluation Patient Details Name: Sandra [name] MRN: [id number] DOB: [birth year] Today's Date: 12/23/2023  History of Present Illness  58 y.o. female presents to [facility] hospital on 12/23/2023 for elective R TKA. PMH includes DMII, HLD, HTN, asthma, HFrEF, psoriasis, GAD, GERD.  Clinical Impression  Pt presents to PT with deficits in functional mobility, gait, balance, strength, ROM. Pt is able to ambulate for household distances with support of the RW. PT provides education on the TKR exercise packet and encourages frequent mobilization with staff assistance. PT will follow up tomorrow for a progression of gait and to initiate stair training.        If plan is discharge home, recommend the following: A little help with walking and/or transfers;A little help with bathing/dressing/bathroom;Assistance with cooking/housework;Assist for transportation;Help with stairs or ramp for entrance   Can travel by private vehicle        Equipment Recommendations None recommended by PT  Recommendations for Other Services       Functional Status Assessment Patient has had a recent decline in their functional status and demonstrates the ability to make significant improvements in function in a reasonable and predictable amount of time.     Precautions / Restrictions Precautions Precautions: Fall;Knee Precaution Booklet Issued: Yes (comment) Recall of Precautions/Restrictions: Intact Restrictions Weight Bearing Restrictions Per Provider Order: Yes RLE Weight Bearing Per Provider Order: Weight bearing as tolerated      Mobility  Bed Mobility Overal bed mobility: Needs Assistance Bed Mobility: Supine to Sit     Supine to sit: Supervision, HOB elevated     General bed mobility comments: increased time    Transfers Overall transfer level: Needs assistance Equipment used: Rolling walker (2 wheels) Transfers: Sit to/from Stand Sit to Stand: Contact guard assist                 Ambulation/Gait Ambulation/Gait assistance: Contact guard assist Gait Distance (Feet): 100 Feet Assistive device: Rolling walker (2 wheels) Gait Pattern/deviations: Step-to pattern Gait velocity: reduced Gait velocity interpretation: <1.31 ft/sec, indicative of household ambulator   General Gait Details: slowed step-to gait  Stairs            Wheelchair Mobility     Tilt Bed    Modified Rankin (Stroke Patients Only)       Balance Overall balance assessment: Needs assistance Sitting-balance support: No upper extremity supported, Feet supported Sitting balance-Leahy Scale: Good     Standing balance support: Single extremity supported, Reliant on assistive device for balance Standing balance-Leahy Scale: Poor                               Pertinent Vitals/Pain Pain Assessment Pain Assessment: 0-10 Pain Score: 5  Pain Location: R knee Pain Descriptors / Indicators: Aching Pain Intervention(s): Monitored during session    Home Living Family/patient expects to be discharged to:: Private residence Living Arrangements: Spouse/significant other;Parent;Other relatives Available Help at Discharge: Family;Available PRN/intermittently Type of Home: House Home Access: Stairs to enter Entrance Stairs-Rails: Right;Left;Can reach both Entrance Stairs-Number of Steps: 3   Home Layout: One level Home Equipment: Agricultural [employer]ant (2 wheels);BSC/3in1;Shower seat;Cane - single point      Prior Function Prior Level of Function : Independent/Modified Independent;Working/employed;Driving             Mobility Comments: ambulatory without DME       Extremity/Trunk Assessment   Upper Extremity Assessment Upper Extremity Assessment: Overall WFL for tasks assessed  Lower Extremity Assessment Lower Extremity Assessment: RLE deficits/detail RLE Deficits / Details: generalized post-op weakness and ROM deficits as anticipated on POD 0    Cervical /  Trunk Assessment Cervical / Trunk Assessment: Normal  Communication   Communication Communication: No apparent difficulties    Cognition Arousal: Alert Behavior During Therapy: WFL for tasks assessed/performed   PT - Cognitive impairments: No apparent impairments                         Following commands: Intact       Cueing Cueing Techniques: Verbal cues     General Comments General comments (skin integrity, edema, etc.): VSS on RA    Exercises Other Exercises Other Exercises: PT provides education on TKR exercise packet   Assessment/Plan    PT Assessment Patient needs continued PT services  PT Problem List Decreased range of motion;Decreased strength;Decreased activity tolerance;Decreased balance;Decreased mobility;Decreased knowledge of use of DME;Pain       PT Treatment Interventions DME instruction;Gait training;Stair training;Functional mobility training;Therapeutic activities;Therapeutic exercise;Balance training;Neuromuscular re-education;Patient/family education    PT Goals (Current goals can be found in the Care Plan section)  Acute Rehab PT Goals Patient Stated Goal: to return to independence PT Goal Formulation: With patient Time For Goal Achievement: 12/27/23 Potential to Achieve Goals: Good    Frequency 7X/week     Co-evaluation               AM-PAC PT 6 Clicks Mobility  Outcome Measure Help needed turning from your back to your side while in a flat bed without using bedrails?: A Little Help needed moving from lying on your back to sitting on the side of a flat bed without using bedrails?: A Little Help needed moving to and from a bed to a chair (including a wheelchair)?: A Little Help needed standing up from a chair using your arms (e.g., wheelchair or bedside chair)?: A Little Help needed to walk in hospital room?: A Little Help needed climbing 3-5 steps with a railing? : Total 6 Click Score: 16    End of Session Equipment  Utilized During Treatment: Gait belt Activity Tolerance: Patient tolerated treatment well Patient left: with call bell/phone within reach;in chair;with family/visitor present Nurse Communication: Mobility status PT Visit Diagnosis: Other abnormalities of gait and mobility (R26.89);Muscle weakness (generalized) (M62.81);Pain Pain - Right/Left: Right Pain - part of body: Knee    Time: 1337-1410 PT Time Calculation (min) (ACUTE ONLY): 33 min   Charges:   PT Evaluation $PT Eval Low Complexity: 1 Low   PT General Charges $$ ACUTE PT VISIT: 1 Visit         Bernardino JINNY Ruth, PT, DPT Acute Rehabilitation Office [phone]   Bernardino JINNY Ruth 12/23/2023, 2:29 PM

## 2023-12-23 NOTE — Transfer of Care (Signed)
 Immediate Anesthesia Transfer of Care Note  Patient: Moncerrat A Vandrunen  Procedure(s) Performed: RIGHT TOTAL KNEE ARTHROPLASTY (Right: Knee)  Patient Location: PACU  Anesthesia Type:MAC, Regional, and Spinal  Level of Consciousness: drowsy  Airway & Oxygen  Therapy: Patient Spontanous Breathing and Patient connected to face mask oxygen   Post-op Assessment: Report given to RN and Post -op Vital signs reviewed and stable  Post vital signs: Reviewed and stable  Last Vitals:  Vitals Value Taken Time  BP 106/63 12/23/23 09:21  Temp    Pulse 79 12/23/23 09:26  Resp 15 12/23/23 09:26  SpO2 100 % 12/23/23 09:26  Vitals shown include unfiled device data.  Last Pain:  Vitals:   12/23/23 0606  TempSrc:   PainSc: 2       Patients Stated Pain Goal: 1 (12/23/23 0606)  Complications: There were no known notable events for this encounter.

## 2023-12-23 NOTE — Anesthesia Postprocedure Evaluation (Signed)
 Anesthesia Post Note  Patient: Sandra [name]  Procedure(s) Performed: RIGHT TOTAL KNEE ARTHROPLASTY (Right: Knee)     Patient location during evaluation: PACU Anesthesia Type: Spinal Level of consciousness: awake and alert Pain management: pain level controlled Vital Signs Assessment: post-procedure vital signs reviewed and stable Respiratory status: spontaneous breathing, nonlabored ventilation and respiratory function stable Cardiovascular status: blood pressure returned to baseline and stable Postop Assessment: no apparent nausea or vomiting Anesthetic complications: no   There were no known notable events for this encounter.  Last Vitals:  Vitals:   12/23/23 1015 12/23/23 1030  BP: (!) 155/84 (!) 156/87  Pulse: 70 69  Resp: (!) 9 11  Temp:    SpO2: 99% 99%    Last Pain:  Vitals:   12/23/23 1015  TempSrc:   PainSc: 0-No pain                 Butler Levander Pinal

## 2023-12-23 NOTE — Anesthesia Procedure Notes (Signed)
 Spinal  Patient location during procedure: OR Start time: 12/23/2023 7:31 AM End time: 12/23/2023 7:36 AM Reason for block: surgical anesthesia Staffing Performed: anesthesiologist  Anesthesiologist: Cleotilde Butler Dade, MD Performed by: Cleotilde Butler Dade, MD Authorized by: Cleotilde Butler Dade, MD   Preanesthetic Checklist Completed: patient identified, IV checked, site marked, risks and benefits discussed, surgical consent, monitors and equipment checked, pre-op evaluation and timeout performed Spinal Block Patient position: sitting Prep: DuraPrep Patient monitoring: heart rate, cardiac monitor, continuous pulse ox and blood pressure Approach: midline Location: L3-4 Injection technique: single-shot Needle Needle type: Sprotte  Needle gauge: 24 G Needle length: 9 cm Assessment Sensory level: T4 Events: CSF return

## 2023-12-24 ENCOUNTER — Other Ambulatory Visit (HOSPITAL_COMMUNITY): Payer: Self-pay

## 2023-12-24 ENCOUNTER — Encounter (HOSPITAL_COMMUNITY): Payer: Self-pay | Admitting: Orthopaedic Surgery

## 2023-12-24 DIAGNOSIS — M1711 Unilateral primary osteoarthritis, right knee: Secondary | ICD-10-CM | POA: Diagnosis not present

## 2023-12-24 LAB — GLUCOSE, CAPILLARY: Glucose-Capillary: 188 mg/dL — ABNORMAL HIGH (ref 70–99)

## 2023-12-24 NOTE — Discharge Summary (Signed)
 Patient ID: Sandra [name] MRN: [id number] DOB/AGE: 57-Nov-1967 57 y.o.  Admit date: 12/23/2023 Discharge date: 12/24/2023  Admission Diagnoses:  Principal Problem:   Primary osteoarthritis of right knee Active Problems:   Status post total right knee replacement   Discharge Diagnoses:  Same  Past Medical History:  Diagnosis Date   Acute conjunctivitis, right eye 01/31/2022   Acute pain of left shoulder 06/07/2017   Adenomyosis 06/03/2012   Arthritis    Asthma    as a child   Asthma 11/07/2005   Reported Diagnosis in 2012, no formal spirometry    Cardiomyopathy (HCC)    likely nonischemic DCM per Dr. 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o. female who was admitted 12/23/2023 for operative treatment ofPrimary osteoarthritis of right knee. Patient has severe unremitting pain that affects sleep, daily activities, and work/hobbies. After pre-op clearance the patient was taken to the operating room on 12/23/2023 and underwent  Procedure(s): RIGHT TOTAL KNEE ARTHROPLASTY.    Patient was given perioperative antibiotics:  Anti-infectives (From admission, onward)    Start     Dose/Rate Route Frequency Ordered Stop   12/23/23 1400  ceFAZolin (ANCEF) IVPB 2g/100 mL premix        2 g 200 mL/hr over 30 Minutes Intravenous Every 6 hours 12/23/23 1112 12/23/23 2212   12/23/23 0810  vancomycin (VANCOCIN) powder  Status:  Discontinued          As needed 12/23/23 0810 12/23/23 0919   12/23/23 0600  ceFAZolin (ANCEF) IVPB 2g/100 mL premix        2 g 200  mL/hr over 30 Minutes Intravenous On call to O.R. 12/23/23 0550 12/23/23 0813   12/23/23 0000  doxycycline  (VIBRA -TABS) 100 MG tablet        100 mg Oral 2 times daily 12/23/23 9285          Patient was given sequential compression devices, early ambulation, and chemoprophylaxis to prevent DVT.  Inpatient Morphine Milligram  Equivalents Per Day 12/1 - 12/2   Values displayed are in units of MME/Day    Order Start / End Date Yesterday Today    oxyCODONE  (Oxy IR/ROXICODONE ) immediate release tablet 5 mg 12/1 - 12/1 0 of Unknown --    oxyCODONE  (ROXICODONE ) 5 MG/5ML solution 5 mg 12/1 - 12/1 0 of Unknown --      Group total: 0 of Unknown     HYDROmorphone (DILAUDID) injection 0.25-0.5 mg 12/1 - 12/1 0 of 40-80 --    fentaNYL  (SUBLIMAZE ) injection 12/1 - 12/1 *30 of 30 --    oxyCODONE  (Oxy IR/ROXICODONE ) immediate release tablet 5 mg 12/1 - No end date 22.5 of 22.5 15 of 30    oxyCODONE  (Oxy IR/ROXICODONE ) immediate release tablet 10 mg 12/1 - No end date 0 of 45 0 of 60    HYDROmorphone (DILAUDID) injection 1 mg 12/1 - No end date 0 of 40 0 of 60    Daily Totals  * 52.5 of Unknown (at least 177.5-217.5) 15 of 150  *One-Step medication  Calculation Errors     Order Type Date Details   oxyCODONE  (Oxy IR/ROXICODONE ) immediate release tablet 5 mg Ordered Dose -- Insufficient frequency information   oxyCODONE  (ROXICODONE ) 5 MG/5ML solution 5 mg Ordered Dose -- Insufficient frequency information            Patient benefited maximally from hospital stay and there were no complications.    Recent vital signs: Patient Vitals for the past 24 hrs:  BP Temp Temp src Pulse Resp SpO2  12/24/23 0754 (!) 144/64 99.8 F (37.7 C) Oral 96 16 95 %  12/24/23 0527 (!) 151/71 98.5 F (36.9 C) -- 84 18 95 %  12/23/23 2247 (!) 143/68 99.7 F (37.6 C) Oral 89 18 95 %  12/23/23 1922 (!) 146/80 99.6 F (37.6 C) Oral 78 18 98 %  12/23/23 1625 (!) 151/76 98.9 F (37.2 C) Oral 72 19 96 %  12/23/23 1112 (!) 152/94 98 F (36.7 C) Oral 72 19 100 %  12/23/23 1045 (!) 160/88 97.7 F (36.5 C) -- 73 14 97 %  12/23/23 1030 (!) 156/87 -- -- 69 11 99 %  12/23/23 1015 (!) 155/84 -- -- 70 (!) 9 99 %  12/23/23 1000 139/85 -- -- 74 13 98 %  12/23/23 0945 137/81 -- -- 81 16 97 %  12/23/23 0930 123/74 -- -- 82 16 94 %  12/23/23 0921  106/63 98.3 F (36.8 C) -- 77 14 93 %     Recent laboratory studies: No results for input(s): WBC, HGB, HCT, PLT, NA, K, CL, CO2, BUN, CREATININE, GLUCOSE, INR, CALCIUM  in the last 72 hours.  Invalid input(s): PT, 2   Discharge Medications:   Allergies as of 12/24/2023       Reactions   Drug Class [trazodone  And Nefazodone]    Developed suicidal ideation within 3 days of starting   Sertraline  Hcl    Developed suicidal ideation within 3d of starting        Medication List     STOP taking these medications    aspirin   EC 81 MG tablet   Dextromethorphan  HBr 15 MG/15ML Liqd       TAKE these medications    apixaban 2.5 MG Tabs tablet Commonly known as: Eliquis Take 1 tablet by mouth twice daily for 30 days after surgery to prevent blood clots.   atorvastatin  80 MG tablet Commonly known as: LIPITOR  Take 1 tablet by mouth once daily   Basaglar  KwikPen 100 UNIT/ML Inject 26 Units into the skin at bedtime.   budesonide -formoterol  160-4.5 MCG/ACT inhaler Commonly known as: Symbicort  Inhale 1 puff into the lungs 2 (two) times daily as needed.   chlorhexidine  4 % external liquid Commonly known as: HIBICLENS Apply 15 mLs (1 Application total) topically as directed for 30 doses. Use as directed daily for 5 days every other week for 6 weeks.   clobetasol 0.05 % external solution Commonly known as: TEMOVATE Apply 1 Application topically daily.   dapagliflozin  propanediol 10 MG Tabs tablet Commonly known as: FARXIGA  Take 1 tablet (10 mg total) by mouth daily.   Dexcom G7 Sensor Misc Place new sensor every 10 days. Use to monitor blood sugar continuously.   docusate sodium  100 MG capsule Commonly known as: Colace Take 1 capsule (100 mg total) by mouth daily as needed.   doxycycline  100 MG tablet Commonly known as: VIBRA -TABS Take 1 tablet (100 mg total) by mouth 2 (two) times daily.   escitalopram  10 MG tablet Commonly known as:  LEXAPRO  Take 1 tablet (10 mg total) by mouth daily.   finasteride 5 MG tablet Commonly known as: PROSCAR Take 2.5 mg by mouth daily.   fluticasone  50 MCG/ACT nasal spray Commonly known as: FLONASE  Place 1-2 sprays into both nostrils daily as needed for allergies or rhinitis.   hydrALAZINE  25 MG tablet Commonly known as: APRESOLINE  Take 1 tablet (25 mg total) by mouth 3 (three) times daily.   metFORMIN  1000 MG tablet Commonly known as: GLUCOPHAGE  Take 1 tablet (1,000 mg total) by mouth 2 (two) times daily with a meal.   methocarbamol  750 MG tablet Commonly known as: ROBAXIN  Take 1 tablet (750 mg total) by mouth 3 (three) times daily as needed.   metoprolol  200 MG 24 hr tablet Commonly known as: TOPROL -XL Take 1 tablet (200 mg total) by mouth daily.   mupirocin ointment 2 % Commonly known as: BACTROBAN Place 1 Application into the nose 2 (two) times daily for 60 doses. Use as directed 2 times daily for 5 days every other week for 6 weeks.   ondansetron  4 MG tablet Commonly known as: Zofran  Take 1 tablet (4 mg total) by mouth every 8 (eight) hours as needed for nausea or vomiting.   oxyCODONE -acetaminophen  5-325 MG tablet Commonly known as: Percocet Take 1-2 tablets by mouth every 8 (eight) hours as needed. To be taken after surgery   sacubitril -valsartan  97-103 MG Commonly known as: ENTRESTO  Take 1 tablet by mouth twice daily   spironolactone  25 MG tablet Commonly known as: ALDACTONE  Take 1 tablet (25 mg total) by mouth daily.   Trulicity  4.5 MG/0.5ML Soaj Generic drug: Dulaglutide  Inject 4.5 mg into the skin once a week.               Durable Medical Equipment  (From admission, onward)           Start     Ordered   12/23/23 1113  DME Walker rolling  Once       Question Answer Comment  Walker: With 5 Inch Wheels   Patient needs a  walker to treat with the following condition Status post left partial knee replacement      12/23/23 1112   12/23/23  1113  DME 3 n 1  Once        12/23/23 1112   12/23/23 1113  DME Bedside commode  Once       Question:  Patient needs a bedside commode to treat with the following condition  Answer:  Status post left partial knee replacement   12/23/23 1112            Diagnostic Studies: DG Knee Right Port Result Date: 12/23/2023 CLINICAL DATA:  Pain, postop. EXAM: PORTABLE RIGHT KNEE - 1-2 VIEW COMPARISON:  None Available. FINDINGS: Right knee arthroplasty in expected alignment. No periprosthetic lucency or fracture. Recent postsurgical change includes air and edema in the soft tissues and joint space. IMPRESSION: Right knee arthroplasty without immediate postoperative complication. Electronically Signed   By: Andrea Gasman M.D.   On: 12/23/2023 11:09    Disposition: Discharge disposition: 01-Home or Self Care          Follow-up Information     Jule Ronal CROME, PA-C. Schedule an appointment as soon as possible for a visit in 2 week(s).   Specialty: Orthopedic Surgery Contact information: [address] [place]  [place] [address] [phone]                  Signed: Ronal CROME Jule 12/24/2023, 8:24 AM

## 2023-12-24 NOTE — Progress Notes (Addendum)
 Subjective: 1 Day Post-Op Procedure(s) (LRB): RIGHT TOTAL KNEE ARTHROPLASTY (Right) Patient reports pain as moderate.    Objective: Vital signs in last 24 hours: Temp:  [97.7 F (36.5 C)-99.8 F (37.7 C)] 99.8 F (37.7 C) (12/02 0754) Pulse Rate:  [69-96] 96 (12/02 0754) Resp:  [9-19] 16 (12/02 0754) BP: (106-160)/(63-94) 144/64 (12/02 0754) SpO2:  [93 %-100 %] 95 % (12/02 0754)  Intake/Output from previous day: 12/01 0701 - 12/02 0700 In: 2860 [P.O.:2160; I.V.:500; IV Piggyback:200] Out: 1050 [Urine:1000; Blood:50] Intake/Output this shift: No intake/output data recorded.  No results for input(s): HGB in the last 72 hours. No results for input(s): WBC, RBC, HCT, PLT in the last 72 hours. No results for input(s): NA, K, CL, CO2, BUN, CREATININE, GLUCOSE, CALCIUM  in the last 72 hours. No results for input(s): LABPT, INR in the last 72 hours.  Neurologically intact Neurovascular intact Sensation intact distally Intact pulses distally Dorsiflexion/Plantar flexion intact Incision: dressing C/D/I No cellulitis present Compartment soft   Assessment/Plan: 1 Day Post-Op Procedure(s) (LRB): RIGHT TOTAL KNEE ARTHROPLASTY (Right) Advance diet Up with therapy D/C IV fluids Discharge home with home health once cleared by PT WBAT RLE Eliquis sent to [facility]      Ronal LITTIE Grave 12/24/2023, 8:23 AM

## 2023-12-24 NOTE — Progress Notes (Signed)
 Physical Therapy Treatment Patient Details Name: Sandra [name] MRN: [id number] DOB: 1965/01/26 Today's Date: 12/24/2023   History of Present Illness 58 y.o. female admitted 12/23/2023 for same day elective R TKA. PMH includes DMII, HLD, HTN, asthma, HFrEF, psoriasis, GAD, GERD.   PT Comments  Pt progressing with mobility. Pt able to perform gait training with RW and stair training with supervision-CGA. Pt preparing for d/c home today; all education reviewed, pt reports no further questions or concerns.      If plan is discharge home, recommend the following: A little help with bathing/dressing/bathroom;Assistance with cooking/housework;Assist for transportation;Help with stairs or ramp for entrance   Can travel by private vehicle     Yes   Equipment Recommendations  None recommended by PT    Recommendations for Other Services       Precautions / Restrictions Precautions Precautions: Fall;Knee Recall of Precautions/Restrictions: Intact Restrictions Weight Bearing Restrictions Per Provider Order: Yes RLE Weight Bearing Per Provider Order: Weight bearing as tolerated     Mobility  Bed Mobility Overal bed mobility: Modified Independent       Supine to sit: Modified independent (Device/Increase time), HOB elevated          Transfers Overall transfer level: Modified independent Equipment used: Rolling walker (2 wheels), None Transfers: Sit to/from Stand                  Ambulation/Gait Ambulation/Gait assistance: Supervision Gait Distance (Feet): 200 Feet Assistive device: Rolling walker (2 wheels) Gait Pattern/deviations: Step-to pattern, Step-through pattern, Decreased stride length, Trunk flexed Gait velocity: Decreased     General Gait Details: slow, antalgic gait with RW and supervision for safety; notable trunk extension when taking RLE step, pt able to correct with cues for sequencing   Stairs Stairs: Yes Stairs assistance: Contact guard  assist Stair Management: One rail Right, Two rails, Step to pattern, Forwards, Backwards Number of Stairs: 3 General stair comments: ascend 3 steps with BUE rail support, step-to pattern; descend backwards for ease of lowering RLE down; CGA for balance   Wheelchair Mobility     Tilt Bed    Modified Rankin (Stroke Patients Only)       Balance Overall balance assessment: Needs assistance Sitting-balance support: No upper extremity supported, Feet supported Sitting balance-Leahy Scale: Good     Standing balance support: No upper extremity supported, During functional activity Standing balance-Leahy Scale: Fair Standing balance comment: can stand without UE support, reliant on UE support to take step                            Communication Communication Communication: No apparent difficulties  Cognition Arousal: Alert Behavior During Therapy: WFL for tasks assessed/performed   PT - Cognitive impairments: No apparent impairments                         Following commands: Intact      Cueing Cueing Techniques: Verbal cues  Exercises Other Exercises Other Exercises: seated R knee LAQ, knee flex with holds    General Comments General comments (skin integrity, edema, etc.): pt's husband present. reviewed educ re: role of acute PT, POC, precautions, positioning, edema control, DVT prevention, therex/AROM, activity recommendations, importance of mobility, potential d/c needs and DME use      Pertinent Vitals/Pain Pain Assessment Pain Assessment: Faces Faces Pain Scale: Hurts even more Pain Location: R knee Pain Descriptors / Indicators: Aching, Sore, Guarding  Pain Intervention(s): Monitored during session, Limited activity within patient's tolerance, Premedicated before session    Home Living Family/patient expects to be discharged to:: Private residence Living Arrangements: Spouse/significant other;Parent;Other relatives Available Help at  Discharge: Family;Available PRN/intermittently Type of Home: House Home Access: Stairs to enter Entrance Stairs-Rails: Right;Left;Can reach both Entrance Stairs-Number of Steps: 3   Home Layout: One level Home Equipment: Agricultural Consultant (2 wheels);BSC/3in1;Shower seat;Cane - single point      Prior Function            PT Goals (current goals can now be found in the care plan section) Progress towards PT goals: Progressing toward goals    Frequency    7X/week      PT Plan      Co-evaluation              AM-PAC PT 6 Clicks Mobility   Outcome Measure  Help needed turning from your back to your side while in a flat bed without using bedrails?: None Help needed moving from lying on your back to sitting on the side of a flat bed without using bedrails?: None Help needed moving to and from a bed to a chair (including a wheelchair)?: None Help needed standing up from a chair using your arms (e.g., wheelchair or bedside chair)?: None Help needed to walk in hospital room?: A Little Help needed climbing 3-5 steps with a railing? : A Little 6 Click Score: 22    End of Session   Activity Tolerance: Patient tolerated treatment well Patient left: in bed;with call bell/phone within reach;with family/visitor present Nurse Communication: Mobility status PT Visit Diagnosis: Other abnormalities of gait and mobility (R26.89);Muscle weakness (generalized) (M62.81);Pain Pain - Right/Left: Right Pain - part of body: Knee     Time: 0822-0843 PT Time Calculation (min) (ACUTE ONLY): 21 min  Charges:    $Gait Training: 8-22 mins PT General Charges $$ ACUTE PT VISIT: 1 Visit                     Darice Almas, PT, DPT Acute Rehabilitation Services  Personal: Secure Chat Rehab Office: [phone]  Darice LITTIE Almas 12/24/2023, 12:06 PM

## 2023-12-24 NOTE — Plan of Care (Signed)
Pt given D/C instructions with verbal understanding. Rx's were sent to the pharmacy by MD. Pt's incision is clean and dry with no sign of infection. Pt's IV was removed prior to D/C. Pt D/C'd home via wheelchair per MD order. Pt is stable @ D/C and has no other needs at this time. [name], RN  

## 2023-12-24 NOTE — Evaluation (Signed)
 Occupational Therapy Evaluation Patient Details Name: Sandra [name] MRN: [id number] DOB: [birth year] Today's Date: 12/24/2023   History of Present Illness   58 y.o. female presents to [facility] hospital on 12/23/2023 for elective R TKA. PMH includes DMII, HLD, HTN, asthma, HFrEF, psoriasis, GAD, GERD.     Clinical Impressions Patient admitted for the procedure above.  PTA she lives with her spouse, who can assist as needed.  Patient remained independent with ADL,iADL, drives and continues to work.  Patient is experiencing pain at her operative leg, but is able to dress herself with distant supervision and is walking the room with RW and close to Mod I.  Patient is returning home today, and no post acute OT is indicated.       If plan is discharge home, recommend the following:   Assist for transportation;Assistance with cooking/housework     Functional Status Assessment   Patient has had a recent decline in their functional status and demonstrates the ability to make significant improvements in function in a reasonable and predictable amount of time.     Equipment Recommendations   None recommended by OT     Recommendations for Other Services         Precautions/Restrictions   Precautions Precautions: Fall;Knee Recall of Precautions/Restrictions: Intact Restrictions Weight Bearing Restrictions Per Provider Order: Yes RLE Weight Bearing Per Provider Order: Weight bearing as tolerated     Mobility Bed Mobility   Bed Mobility: Supine to Sit     Supine to sit: Modified independent (Device/Increase time), HOB elevated          Transfers Overall transfer level: Needs assistance Equipment used: Rolling walker (2 wheels) Transfers: Sit to/from Stand Sit to Stand: Supervision                  Balance Overall balance assessment: Needs assistance Sitting-balance support: No upper extremity supported, Feet supported Sitting balance-Leahy Scale: Good      Standing balance support: Reliant on assistive device for balance Standing balance-Leahy Scale: Fair                             ADL either performed or assessed with clinical judgement   ADL Overall ADL's : Modified independent                                             Vision Patient Visual Report: No change from baseline       Perception Perception: Not tested       Praxis Praxis: Not tested       Pertinent Vitals/Pain Pain Assessment Pain Assessment: Faces Faces Pain Scale: Hurts little more Pain Location: R knee Pain Descriptors / Indicators: Aching Pain Intervention(s): Monitored during session, Premedicated before session     Extremity/Trunk Assessment Upper Extremity Assessment Upper Extremity Assessment: Overall WFL for tasks assessed   Lower Extremity Assessment Lower Extremity Assessment: Defer to PT evaluation   Cervical / Trunk Assessment Cervical / Trunk Assessment: Normal   Communication Communication Communication: No apparent difficulties   Cognition Arousal: Alert Behavior During Therapy: WFL for tasks assessed/performed Cognition: No apparent impairments                               Following commands: Intact  Cueing  General Comments   Cueing Techniques: Verbal cues      Exercises     Shoulder Instructions      Home Living Family/patient expects to be discharged to:: Private residence Living Arrangements: Spouse/significant other;Parent;Other relatives Available Help at Discharge: Family;Available PRN/intermittently Type of Home: House Home Access: Stairs to enter [employer] of Steps: 3 Entrance Stairs-Rails: Right;Left;Can reach both Home Layout: One level     Bathroom Shower/Tub: Chief Strategy Officer: Standard     Home Equipment: Agricultural Consultant (2 wheels);BSC/3in1;Shower seat;Cane - single point          Prior Functioning/Environment  Prior Level of Function : Independent/Modified Independent;Working/employed;Driving             Mobility Comments: ambulatory without DME ADLs Comments: Independent with ADLs and IADLs; works full-time in childcare; drives    OT Problem List: Pain;Decreased range of motion   OT Treatment/Interventions:        OT Goals(Current goals can be found in the care plan section)   Acute Rehab OT Goals Patient Stated Goal: Return home OT Goal Formulation: With patient Time For Goal Achievement: 01/07/24 Potential to Achieve Goals: Good ADL Goals Pt Will Perform Grooming: with modified independence;standing Pt Will Perform Lower Body Dressing: with modified independence;sit to/from stand Pt Will Transfer to Toilet: with modified independence;ambulating;regular height toilet   OT Frequency:       Co-evaluation              AM-PAC OT 6 Clicks Daily Activity     Outcome Measure Help from another person eating meals?: None Help from another person taking care of personal grooming?: None Help from another person toileting, which includes using toliet, bedpan, or urinal?: A Little Help from another person bathing (including washing, rinsing, drying)?: A Little Help from another person to put on and taking off regular upper body clothing?: None Help from another person to put on and taking off regular lower body clothing?: A Little 6 Click Score: 21   End of Session Equipment Utilized During Treatment: Rolling walker (2 wheels) Nurse Communication: Mobility status  Activity Tolerance: Patient tolerated treatment well Patient left: in bed;with call bell/phone within reach  OT Visit Diagnosis: Unsteadiness on feet (R26.81);Pain Pain - part of body: Knee                Time: 0805-0821 OT Time Calculation (min): 16 min Charges:  OT General Charges $OT Visit: 1 Visit OT Evaluation $OT Eval Moderate Complexity: 1 Mod  12/24/2023  RP, OTR/L  Acute Rehabilitation  Services  Office:  [phone]   Sandra [name] 12/24/2023, 8:58 AM

## 2023-12-26 ENCOUNTER — Telehealth: Payer: Self-pay | Admitting: Orthopaedic Surgery

## 2023-12-26 NOTE — Telephone Encounter (Signed)
 Pt called stating her employer is going to fax extended FMLA forms. Informed pt to have them fax over for [employer] to sign with extended FMLA dates.

## 2023-12-26 NOTE — Telephone Encounter (Signed)
 Pt's husband came into office stating need a letter for his claims and short term claims for proof pt had knee replacement 12/23/2023. Please include the name of procedure and estimated time out of work. Please call pt's husband ot pt when ready for pick up. Pt number is [phone]

## 2023-12-26 NOTE — Telephone Encounter (Signed)
 6 weeks for the husband

## 2023-12-27 NOTE — Telephone Encounter (Signed)
 Letter created and placed up front. Patient notified.

## 2023-12-30 ENCOUNTER — Telehealth: Payer: Self-pay | Admitting: Orthopaedic Surgery

## 2023-12-30 ENCOUNTER — Other Ambulatory Visit: Payer: Self-pay | Admitting: Physician Assistant

## 2023-12-30 MED ORDER — OXYCODONE-ACETAMINOPHEN 5-325 MG PO TABS: 1.0000 | ORAL_TABLET | Freq: Three times a day (TID) | ORAL | 0 refills | Status: DC | PRN

## 2023-12-30 NOTE — Telephone Encounter (Signed)
 [employer] [facility] claim form completed and faxed. Other two forms for Cancer and accident, unable to complete as not applicable.

## 2023-12-30 NOTE — Telephone Encounter (Signed)
 Pt called again about medication refill. Informed pt that the Dr or PA have to be the one to sign off on refill. Pt is asking a soon as Dr Jerri or Jule can refill her medication. Pt number is [phone].

## 2023-12-30 NOTE — Telephone Encounter (Signed)
Patient called. She would like some pain medication called in.

## 2023-12-30 NOTE — Telephone Encounter (Signed)
 Called and let patient know that pain medicine has been sent to pharmacy.

## 2023-12-30 NOTE — Telephone Encounter (Signed)
 Can you please let patient know that we have been in surgery this morning.  I have sent in meds to pharmacy on file.  Can we please also remind patient of our new medication refill rule so she is aware for next time she needs a refill.  thanks

## 2023-12-31 ENCOUNTER — Telehealth: Payer: Self-pay | Admitting: Orthopaedic Surgery

## 2023-12-31 NOTE — Telephone Encounter (Signed)
 She can stop wearing them per Dr.Xu.

## 2023-12-31 NOTE — Telephone Encounter (Signed)
 Mark with [facility] called saying that pt is complaining  that the compression stockings are hurting her legs. Call back number is [phone]

## 2023-12-31 NOTE — Telephone Encounter (Signed)
 Called and notified patient that she could discontinue them. Elevate as needed.

## 2024-01-03 ENCOUNTER — Other Ambulatory Visit: Payer: Self-pay | Admitting: Physician Assistant

## 2024-01-03 ENCOUNTER — Telehealth: Payer: Self-pay | Admitting: Orthopaedic Surgery

## 2024-01-03 MED ORDER — OXYCODONE-ACETAMINOPHEN 5-325 MG PO TABS: 1.0000 | ORAL_TABLET | Freq: Three times a day (TID) | ORAL | 0 refills | Status: DC | PRN

## 2024-01-03 NOTE — Telephone Encounter (Signed)
 Sent oxy

## 2024-01-03 NOTE — Telephone Encounter (Signed)
 Pt called wanting a refill of her Oxycodone . Pharmacy is [facility] on [address]. Call back number is [phone]. Pt also said that her husband brought paper work in to be filled out on the 4th and hasn't heard anything back about picking them up.

## 2024-01-06 ENCOUNTER — Telehealth: Payer: Self-pay | Admitting: *Deleted

## 2024-01-06 NOTE — Telephone Encounter (Signed)
 RTC to [place].  Message was left that the Clinics was returning his call.  Call to [facility] .  Message left that the Clinics had returned his call. Call to patient.  Repeated her blood pressure it is now 151 93 after taking her blood pressure.  Is in pain from her recent Surgery.  Patient was advised to call the Clinics if her blood pressures remain high and to take her pain medication to lower her pain discomfort. Clinic Copied from CRM [id number]. Topic: Clinical - Home Health Verbal Orders >> Jan 06, 2024 12:21 PM DeAngela L wrote: Caller/Agency: [facility] Physical therapist with [facility] Number: [phone] secured line ok to leave a vm Any new concerns about the patient? Yes, the patient had no other concerns but BP was 182/97 the patient blood pressure was high but the patient states she had not taken her medication yet, so this could be the reason but he wants to report for safety concerns

## 2024-01-06 NOTE — Telephone Encounter (Signed)
 IC  spoke to patient. Advised [employer] form faxed 12/4. It was not noted to pickup. Patient is good with it faxed.

## 2024-01-07 ENCOUNTER — Ambulatory Visit: Admitting: Physician Assistant

## 2024-01-07 DIAGNOSIS — Z96651 Presence of right artificial knee joint: Secondary | ICD-10-CM

## 2024-01-07 MED ORDER — OXYCODONE-ACETAMINOPHEN 5-325 MG PO TABS: 1.0000 | ORAL_TABLET | Freq: Four times a day (QID) | ORAL | 0 refills | Status: AC | PRN

## 2024-01-07 NOTE — Progress Notes (Signed)
 Post-Op Visit Note   Patient: Sandra [name]           Date of Birth: 1965/12/27           MRN: [id number] Visit Date: 01/07/2024 PCP: [name], MD   Assessment & Plan:  Chief Complaint:  Chief Complaint  Patient presents with   Right Knee - Follow-up    Right TKA 12/23/2023   Visit Diagnoses:  1. Status post total right knee replacement     Plan: Patient is a pleasant 58 year old female who comes in today 2 weeks status post right total knee replacement 12/23/2023.  She has been doing okay but has been experiencing a fair amount of pain.  She has been taking oxycodone  as needed.  She has been compliant taking Eliquis  twice daily for DVT prophylaxis.  She has been getting home health physical therapy is ambulating with a walker.  Examination of the right knee reveals a well healed surgical incision with nylon sutures in place.  No evidence of infection or cellulitis.  Calves are soft nontender.  She is neurovascular intact distally.  Today, sutures were removed and Steri-Strips applied.  I have refilled her pain meds.  I sent in a referral for outpatient physical therapy.  Continue with Eliquis  for twice daily for another 2 weeks and then transition to a baby aspirin  twice daily for 2 weeks before resuming her regular dose of baby aspirin  once daily.  She will follow-up in 4 weeks for repeat evaluation and 4 view x-rays of the right knee.  Call with concerns or questions.  Follow-Up Instructions: Return in about 4 weeks (around 02/04/2024).   Orders:  Orders Placed This Encounter  Procedures   Ambulatory referral to Physical Therapy   Meds ordered this encounter  Medications   oxyCODONE -acetaminophen  (PERCOCET) 5-325 MG tablet    Sig: Take 1-2 tablets by mouth every 6 (six) hours as needed. To be taken after surgery    Dispense:  20 tablet    Refill:  0    Imaging: No results found.  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   Influenza 02/23/2016   Psoriasis 04/29/2013   Morbid obesity (HCC) 05/16/2012   Chronic HFrEF (heart failure with reduced ejection fraction) (HCC) 02/22/2012   LBBB (left bundle branch block) 01/24/2012   Routine adult health maintenance 09/27/2010   Type 2 diabetes mellitus with other specified complication (HCC) 11/17/2009   Hyperlipidemia 08/20/2007   Essential hypertension 07/08/2006   Asthma 11/07/2005   Past Medical History:  Diagnosis Date   Acute conjunctivitis, right eye 01/31/2022   Acute pain of left shoulder 06/07/2017   Adenomyosis 06/03/2012   Arthritis    Asthma    as a child   Asthma 11/07/2005   Reported Diagnosis in 2012, no formal spirometry    Cardiomyopathy (HCC)    likely nonischemic DCM per Dr. 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   Perimenopausal menorrhagia 02/20/2012   Perimenopausal menorrhagia 02/20/2012   Psoriasis 04/29/2013   Right foot strain 05/15/2021   Right low back pain 03/02/2014   Routine adult health maintenance 09/27/2010   Swelling of abdominal wall 03/02/2014   Swelling of abdominal wall 03/02/2014   Symptoms of upper respiratory infection (URI) 04/01/2018   Tinnitus 06/01/2020   Viral illness 10/08/2013   Viral URI with cough 01/13/2013    Family History  Problem Relation Age of Onset   Hypertension Father    Diabetes Father     Past Surgical History:  Procedure Laterality Date   CHOLECYSTECTOMY  2009   DILATATION & CURETTAGE/HYSTEROSCOPY WITH MYOSURE N/A 12/25/2017   Procedure: DILATATION & CURETTAGE/HYSTEROSCOPY   POLYPECTOMY WITH MYOSURE;  Surgeon: Linnell Devere BRAVO, MD;  Location: [facility];  Service: Gynecology;  Laterality: N/A;   TOTAL KNEE ARTHROPLASTY Right 12/23/2023   Procedure: RIGHT TOTAL KNEE ARTHROPLASTY;  Surgeon: [name], MD;  Location: [facility];   Service: Orthopedics;  Laterality: Right;   TUBAL LIGATION     Social History   Occupational History   Occupation: Building surveyor  Tobacco Use   Smoking status: Never   Smokeless tobacco: Never  Vaping Use   Vaping status: Never Used  Substance and Sexual Activity   Alcohol use: Yes    Alcohol/week: 0.0 standard drinks of alcohol    Comment: occ   Drug use: No   Sexual activity: Yes    Birth control/protection: Surgical

## 2024-01-07 NOTE — Therapy (Incomplete)
 OUTPATIENT PHYSICAL THERAPY LOWER EXTREMITY EVALUATION   Patient Name: Sandra [name] MRN: [id number] DOB:16-Sep-1965, 58 y.o., female Today's Date: 01/07/2024  END OF SESSION:   Past Medical History:  Diagnosis Date   Acute conjunctivitis, right eye 01/31/2022   Acute pain of left shoulder 06/07/2017   Adenomyosis 06/03/2012   Arthritis    Asthma    as a child   Asthma 11/07/2005   Reported Diagnosis in 2012, no formal spirometry    Cardiomyopathy (HCC)    likely nonischemic DCM per Dr. 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   Perimenopausal menorrhagia 02/20/2012   Perimenopausal menorrhagia 02/20/2012   Psoriasis 04/29/2013   Right foot strain 05/15/2021   Right low back pain 03/02/2014   Routine adult health maintenance 09/27/2010   Swelling of abdominal wall 03/02/2014   Swelling of abdominal wall 03/02/2014   Symptoms of upper respiratory infection (URI) 04/01/2018   Tinnitus 06/01/2020   Viral illness 10/08/2013   Viral URI with cough 01/13/2013   Past Surgical History:  Procedure Laterality Date   CHOLECYSTECTOMY  2009   DILATATION & CURETTAGE/HYSTEROSCOPY WITH MYOSURE N/A 12/25/2017   Procedure: DILATATION & CURETTAGE/HYSTEROSCOPY   POLYPECTOMY WITH MYOSURE;  Surgeon: Linnell Devere BRAVO, MD;  Location: [facility];  Service: Gynecology;  Laterality: N/A;   TOTAL KNEE ARTHROPLASTY Right 12/23/2023   Procedure: RIGHT TOTAL KNEE ARTHROPLASTY;  Surgeon: [name], MD;  Location: [facility];  Service: Orthopedics;  Laterality: Right;   TUBAL LIGATION     Patient Active Problem List   Diagnosis Date Noted   Status post total right knee replacement 12/23/2023   Primary osteoarthritis of right knee 12/20/2023   Exertional dyspnea 03/27/2023   Macrocytic anemia 08/08/2022   Trigger index finger of left hand 08/08/2022   Pigmented skin lesion of uncertain behavior of head 02/01/2022   Post-viral cough syndrome 10/29/2019   GERD (gastroesophageal reflux disease) 10/15/2019   Generalized anxiety disorder 09/21/2019   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   Influenza 02/23/2016   Psoriasis 04/29/2013   Morbid obesity (HCC) 05/16/2012   Chronic HFrEF (heart failure with reduced ejection fraction) (HCC) 02/22/2012   LBBB (left bundle branch  block) 01/24/2012   Routine adult health maintenance 09/27/2010   Type 2 diabetes mellitus with other specified complication (HCC) 11/17/2009   Hyperlipidemia 08/20/2007   Essential hypertension 07/08/2006    Asthma 11/07/2005    PCP: Armando Rossetti, MD   REFERRING PROVIDER: Ronal Bimler, PA-C  REFERRING DIAG: [phone] (ICD-10-CM) - Status post total right knee replacement  THERAPY DIAG:  No diagnosis found.  Rationale for Evaluation and Treatment: Rehabilitation  ONSET DATE: 12/23/2023 s/p Rt TKA  SUBJECTIVE:   SUBJECTIVE STATEMENT: ***  PERTINENT HISTORY: *** PAIN:  Are you having pain? Yes: NPRS scale: *** Pain location: *** Pain description: *** Aggravating factors: *** Relieving factors: ***  PRECAUTIONS: None  RED FLAGS: {PT Red Flags:29287}   WEIGHT BEARING RESTRICTIONS: No  FALLS:  Has patient fallen in last 6 months? {fallsyesno:27318}  LIVING ENVIRONMENT: Lives with: {OPRC lives with:25569::lives with their family} Lives in: {Lives in:25570} Stairs: {opstairs:27293} Has following equipment at home: {Assistive devices:23999}  OCCUPATION: ***  PLOF: {PLOF:24004}  PATIENT GOALS: ***  NEXT MD VISIT: 02/04/2024  OBJECTIVE:  Note: Objective measures were completed at Evaluation unless otherwise noted.  DIAGNOSTIC FINDINGS:  IMPRESSION: Right knee arthroplasty without immediate postoperative complication.  PATIENT SURVEYS:  PSFS: THE PATIENT SPECIFIC FUNCTIONAL SCALE  Place score of 0-10 (0 = unable to perform activity and 10 = able to perform activity at the same level as before injury or problem)  Activity Date: 01/08/2024         2.     3.     4.      Total Score ***      Total Score = Sum of activity scores/number of activities  Minimally Detectable Change: 3 points (for single activity); 2 points (for average score)  [facility] (nd). The Patient Specific Functional Scale . Retrieved from [email or web address]   COGNITION: Overall cognitive status: {cognition:24006}     SENSATION: {sensation:27233}  EDEMA:  {edema:24020}  MUSCLE LENGTH: Hamstrings: Right ***  deg; Left *** deg Debby test: Right *** deg; Left *** deg  POSTURE: {posture:25561}  PALPATION: ***  LOWER EXTREMITY ROM:  ROM Right Eval 01/08/2024 Left Eval 01/08/2024  Hip flexion    Hip extension    Hip abduction    Hip adduction    Hip internal rotation    Hip external rotation    Knee flexion    Knee extension    Ankle dorsiflexion    Ankle plantarflexion    Ankle inversion    Ankle eversion     (Blank rows = not tested)  LOWER EXTREMITY MMT:  MMT Right Eval 01/08/2024 Left Eval 01/08/2024  Hip flexion    Hip extension    Hip abduction    Hip adduction    Hip internal rotation    Hip external rotation    Knee flexion    Knee extension    Ankle dorsiflexion    Ankle plantarflexion    Ankle inversion    Ankle eversion     (Blank rows = not tested)  LOWER EXTREMITY SPECIAL TESTS:  {LEspecialtests:26242}  FUNCTIONAL TESTS:  {Functional tests:24029}  GAIT: Distance walked: *** Assistive device utilized: {Assistive devices:23999} Level of assistance: {Levels of assistance:24026} Comments: ***  TREATMENT DATE:  01/08/2024 TherEx:  HEP handout provided with patient performing one set of each activity for appropriate form. Verbal and tactile cues provided.   Self-Care:  POC, typical recovery from TKA, ***     PATIENT EDUCATION:  Education details: *** Person educated: {Person educated:25204} Education method: {Education Method:25205} Education comprehension: {Education Comprehension:25206}  HOME EXERCISE PROGRAM: ***  ASSESSMENT:  CLINICAL IMPRESSION: Patient is a 58 y.o. F who was seen today for physical therapy evaluation and treatment for s/p Rt TKA presenting with ***. Patient will benefit from skilled PT to address above noted deficits and return to PLOF.   OBJECTIVE IMPAIRMENTS:  {opptimpairments:25111}.   ACTIVITY LIMITATIONS: {activitylimitations:27494}  PARTICIPATION LIMITATIONS: {participationrestrictions:25113}  PERSONAL FACTORS: {Personal factors:25162} are also affecting patient's functional outcome.   REHAB POTENTIAL: {rehabpotential:25112}  CLINICAL DECISION MAKING: {clinical decision making:25114}  EVALUATION COMPLEXITY: {Evaluation complexity:25115}   GOALS: Goals reviewed with patient? Yes  SHORT TERM GOALS: Target date: *** Patient will show compliance with initial HEP. Baseline: Goal status: INITIAL  2.  Patient will report pain levels no greater than ***/10 in order to show an improved overall quality of life. Baseline:  Goal status: INITIAL  3.  *** Baseline:  Goal status: INITIAL  4.  *** Baseline:  Goal status: INITIAL  5.  *** Baseline:  Goal status: INITIAL  6.  *** Baseline:  Goal status: INITIAL  LONG TERM GOALS: Target date: ***  Patient will show independence with final HEP in order to maintain and progress upon functional gains made within PT. Baseline:  Goal status: INITIAL  2.  Patient will report pain levels no greater than ***/10 in order to show an improved overall quality of life. Baseline:  Goal status: INITIAL  3.  Patient will increase PSFS to at least *** in order to show a significantly improved subjective disability rating. Baseline:  Goal status: INITIAL  4.  Patient will increase Rt knee ROM to at least *** in order to improve functional mobility. Baseline:  Goal status: INITIAL  5.  *** Baseline:  Goal status: INITIAL  6.  *** Baseline:  Goal status: INITIAL   PLAN:  PT FREQUENCY: {rehab frequency:25116}  PT DURATION: {rehab duration:25117}  PLANNED INTERVENTIONS: 97164- PT Re-evaluation, 97750- Physical Performance Testing, 97110-Therapeutic exercises, 97530- Therapeutic activity, [id number]- Neuromuscular re-education, 97535- Self Care, 02859- Manual therapy, [id number]- Gait training,  [phone]- Orthotic Initial, [id number]- Orthotic/Prosthetic subsequent, [phone]- Canalith repositioning, [id number]- Aquatic Therapy, H9716- Electrical stimulation (unattended), [phone]- Electrical stimulation (manual), [id number]- Vasopneumatic device, [id number]- Ultrasound, [id number]- Traction (mechanical), [id number]- Ionotophoresis 4mg /ml Dexamethasone , 79439 (1-2 muscles), 20561 (3+ muscles)- Dry Needling, Patient/Family education, Balance training, Stair training, Taping, Joint mobilization, Joint manipulation, Spinal manipulation, Spinal mobilization, Scar mobilization, Compression bandaging, Vestibular training, DME instructions, Cryotherapy, and Moist heat  PLAN FOR NEXT SESSION: ***   Susannah Daring, PT, DPT 01/07/2024 3:09 PM

## 2024-01-08 ENCOUNTER — Ambulatory Visit

## 2024-01-09 ENCOUNTER — Other Ambulatory Visit: Payer: Self-pay | Admitting: Cardiovascular Disease

## 2024-01-09 ENCOUNTER — Ambulatory Visit: Admitting: Rehabilitative and Restorative Service Providers"

## 2024-01-09 ENCOUNTER — Encounter: Payer: Self-pay | Admitting: Rehabilitative and Restorative Service Providers"

## 2024-01-09 DIAGNOSIS — R262 Difficulty in walking, not elsewhere classified: Secondary | ICD-10-CM

## 2024-01-09 DIAGNOSIS — M6281 Muscle weakness (generalized): Secondary | ICD-10-CM | POA: Diagnosis not present

## 2024-01-09 DIAGNOSIS — M25561 Pain in right knee: Secondary | ICD-10-CM

## 2024-01-09 DIAGNOSIS — R600 Localized edema: Secondary | ICD-10-CM | POA: Diagnosis not present

## 2024-01-09 DIAGNOSIS — M25661 Stiffness of right knee, not elsewhere classified: Secondary | ICD-10-CM | POA: Diagnosis not present

## 2024-01-09 DIAGNOSIS — I5022 Chronic systolic (congestive) heart failure: Secondary | ICD-10-CM

## 2024-01-09 NOTE — Progress Notes (Signed)
 OUTPATIENT PHYSICAL THERAPY LOWER EXTREMITY EVALUATION   Patient Name: Sandra [name] MRN: [id number] DOB:May 26, 1965, 58 y.o., female Today's Date: 01/09/2024  END OF SESSION:  PT End of Session - 01/09/24 1659     Visit Number 1    Number of Visits 24    Date for Recertification  04/02/24    Authorization Type [employer]    Progress Note Due on Visit 24    PT Start Time 1100    PT Stop Time 1146    PT Time Calculation (min) 46 min    Activity Tolerance Patient tolerated treatment well;No increased pain;Patient limited by pain    Behavior During Therapy [facility] for tasks assessed/performed          Past Medical History:  Diagnosis Date   Acute conjunctivitis, right eye 01/31/2022   Acute pain of left shoulder 06/07/2017   Adenomyosis 06/03/2012   Arthritis    Asthma    as a child   Asthma 11/07/2005   Reported Diagnosis in 2012, no formal spirometry    Cardiomyopathy (HCC)    likely nonischemic DCM per Dr. 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   Perimenopausal menorrhagia 02/20/2012   Perimenopausal menorrhagia 02/20/2012   Psoriasis 04/29/2013   Right foot strain 05/15/2021   Right low back pain 03/02/2014   Routine adult health maintenance 09/27/2010   Swelling of abdominal wall 03/02/2014   Swelling of abdominal wall 03/02/2014   Symptoms of upper respiratory infection (URI) 04/01/2018   Tinnitus 06/01/2020   Viral illness 10/08/2013   Viral URI with cough 01/13/2013   Past Surgical History:  Procedure Laterality Date   CHOLECYSTECTOMY  2009   DILATATION & CURETTAGE/HYSTEROSCOPY WITH MYOSURE N/A 12/25/2017   Procedure: DILATATION & CURETTAGE/HYSTEROSCOPY   POLYPECTOMY WITH MYOSURE;  Surgeon: Linnell Devere BRAVO, MD;  Location: [facility];  Service: Gynecology;  Laterality: N/A;   TOTAL KNEE ARTHROPLASTY Right 12/23/2023   Procedure: RIGHT TOTAL KNEE ARTHROPLASTY;  Surgeon: [name], MD;  Location: [facility] OR;  Service: Orthopedics;  Laterality: Right;   TUBAL LIGATION     Patient Active Problem List   Diagnosis Date Noted   Status  post total right knee replacement 12/23/2023   Primary osteoarthritis of right knee 12/20/2023   Exertional dyspnea 03/27/2023   Macrocytic anemia 08/08/2022   Trigger index finger of left hand 08/08/2022   Pigmented skin lesion of uncertain behavior of head 02/01/2022   Post-viral cough syndrome 10/29/2019   GERD (gastroesophageal reflux disease) 10/15/2019   Generalized anxiety disorder 09/21/2019    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   Influenza 02/23/2016   Psoriasis 04/29/2013   Morbid obesity (HCC) 05/16/2012   Chronic HFrEF (heart failure with reduced ejection fraction) (HCC) 02/22/2012   LBBB (left bundle branch block) 01/24/2012   Routine adult health maintenance 09/27/2010   Type 2 diabetes mellitus with other specified complication (HCC) 11/17/2009   Hyperlipidemia 08/20/2007   Essential hypertension 07/08/2006   Asthma 11/07/2005    PCP: Armando Rossetti, MD  REFERRING PROVIDER: Ronal LITTIE Grave, PA-C  REFERRING DIAG:  [phone] (ICD-10-CM) - Status post total right knee replacement    THERAPY DIAG:  Difficulty in walking, not elsewhere classified - Plan: PT plan of care cert/re-cert  Muscle weakness (generalized) - Plan: PT plan of care cert/re-cert  Stiffness of right knee, not elsewhere classified - Plan: PT plan of care cert/re-cert  Localized edema - Plan: PT plan of care cert/re-cert  Acute pain of right knee - Plan: PT plan of care cert/re-cert  Rationale for Evaluation and Treatment: Rehabilitation  ONSET DATE: 12/23/2023  SUBJECTIVE:   SUBJECTIVE STATEMENT: Camyah is getting about 3 hours of sleep uninterrupted.    PERTINENT HISTORY: Type 2 diabetes, HLD, HTN, asthma   PAIN:  Are you having pain? Yes: NPRS scale: 5-7/10 this week Pain location: Rt knee  Pain description: Ache, stiff, sore, sharp Aggravating factors: Prolonged postures, sleeping and bending Relieving factors: Pain meds, ice  PRECAUTIONS: None  RED FLAGS: None   WEIGHT BEARING RESTRICTIONS: No  FALLS:  Has patient fallen in last 6 months? No  LIVING ENVIRONMENT: Lives with: lives with their family and lives with their spouse Lives in: House/apartment Stairs: OK with a rail, but needs work Has following equipment at home: Environmental Consultant - 2 wheeled  OCCUPATION: Designer, jewellery  PLOF: Independent  PATIENT GOALS: Normal motion, walk without an assistive  device, return to work without restrictions, sleep normally  NEXT MD VISIT: 02/04/2023  OBJECTIVE:  Note: Objective measures were completed at Evaluation unless otherwise noted.  DIAGNOSTIC FINDINGS: Right knee arthroplasty without immediate postoperative complication.  PATIENT SURVEYS:  PSFS: THE PATIENT SPECIFIC FUNCTIONAL SCALE  Place score of 0-10 (0 = unable to perform activity and 10 = able to perform activity at the same level as before injury or problem)  Activity Date: 01/09/2024    [facility] the right knee 2    2.  Walk 2    3.  Stand 4    4.  Sleep 3    Total Score 2.75      Total Score = Sum of activity scores/number of activities  Minimally Detectable Change: 3 points (for single activity); 2 points (for average score)  [employer] [facility] (nd). The Patient Specific Functional Scale . Retrieved from [email or web address]   COGNITION: Overall cognitive status: Within functional limits for tasks assessed     SENSATION: Callista had no complaints of peripheral pain or paresthesias  EDEMA:  Noted and not objectively assessed   LOWER EXTREMITY ROM:  Active ROM Left/Right 01/09/2024   Hip flexion    Hip extension    Hip abduction    Hip adduction  Hip internal rotation    Hip external rotation    Knee flexion 134/70   Knee extension 0/-14   Ankle dorsiflexion    Ankle plantarflexion    Ankle inversion    Ankle eversion     (Blank rows = not tested)  LOWER EXTREMITY STRENGTH:  MMT Left/Right 01/09/2024   Hip flexion    Hip extension    Hip abduction    Hip adduction    Hip internal rotation    Hip external rotation    Knee flexion    Knee extension 4+/2+   Ankle dorsiflexion    Ankle plantarflexion    Ankle inversion    Ankle eversion     (Blank rows = not tested)  GAIT: Distance walked: 100 feet Assistive device utilized: Environmental Consultant - 2 wheeled Level of assistance: Complete  Independence Comments: Jenisha would like to be assistive device free                                                                                                                              TREATMENT DATE:  01/09/2024 Supine quadriceps sets with a heel prop under the right heel 2 sets of 10 for 5 seconds Seated knee flexion active assisted range of motion with left pushing the right into flexion 2 sets of 5 for 10 seconds TKE between reps of seated knee flexion 2 sets of 5 for 3 seconds  02464: Discussed the early emphasis on active range of motion, quadriceps strength and edema control; emphasized to Lequisha that although this is a very painful process, is going to hurt whether she participates or not so it is worth her time to fully participate to meet long-term goals.  Vaso right knee 10 minutes medium pressure 34 degrees   PATIENT EDUCATION:  Education details: See above Person educated: Patient Education method: Explanation, Demonstration, Tactile cues, Verbal cues, and Handouts Education comprehension: verbalized understanding, returned demonstration, verbal cues required, tactile cues required, and needs further education  HOME EXERCISE PROGRAM: Access Code: [email or web address] URL: [email or web address] Date: 01/09/2024 Prepared by: Lamar Ivory  Exercises - Supine Quadricep Sets  - 5 x daily - 7 x weekly - 2 sets - 10 reps - 5 second hold - Seated Knee Flexion AAROM  - 5 x daily - 7 x weekly - 2 sets - 5 reps - 10 seconds hold - Seated Long Arc Quad  - 5 x daily - 7 x weekly - 2 sets - 5 reps  ASSESSMENT:  CLINICAL IMPRESSION: Patient is a 58 y.o. female who was seen today for physical therapy evaluation and treatment for  Z96.651 (ICD-10-CM) - Status post total right knee replacement  .  Khalani did a great job in the office today and had improved active range of motion when she left as compared to objective visits listed above.  I strongly encouraged her to  get her exercises at least 5 times a day and to push herself as  she is going to have pain whether or not she completes her home exercises and she might as well get the benefit associated with home exercise program participation.  Greyson understood this and based on her efforts today is a good rehabilitation candidate.  OBJECTIVE IMPAIRMENTS: Abnormal gait, decreased activity tolerance, decreased endurance, decreased knowledge of condition, difficulty walking, decreased ROM, decreased strength, increased edema, impaired perceived functional ability, obesity, and pain.   ACTIVITY LIMITATIONS: bending, standing, squatting, sleeping, stairs, dressing, and locomotion level  PARTICIPATION LIMITATIONS: cleaning, interpersonal relationship, driving, community activity, and occupation  PERSONAL FACTORS: Type 2 diabetes, HLD, HTN, asthma are also affecting patient's functional outcome.   REHAB POTENTIAL: Good  CLINICAL DECISION MAKING: Stable/uncomplicated  EVALUATION COMPLEXITY: Low   GOALS: Goals reviewed with patient? Yes  SHORT TERM GOALS: Target date: 02/20/2024 Forrestine will be independent with her day 1 home exercise program Baseline: Started 01/09/2024 Goal status: INITIAL  2.  Improve right knee active range of motion to 0 - 5 - 95 degrees Baseline: 0 - 14 - 70 degrees Goal status: INITIAL  3.  Ashelynn will be able to extend her knee against gravity with no quadriceps lag Baseline: Unable at evaluation Goal status: INITIAL   LONG TERM GOALS: Target date: 04/02/2024  Improve patient-specific functional score to at least 6 Baseline: 2.75 Goal status: INITIAL  2.  Janet will report right knee pain consistently 3/10 or better on the visual analog scale Baseline: 5-7/10 Goal status: INITIAL  3.  Improve right knee active range of motion to 0 - 3 - 110 degrees or better Baseline: 0 - 14 - 70 degrees Goal status: INITIAL  4.  Improve right quadriceps strength as assessed by  MMT and functional scores Baseline: Unable to fully extend the knee against gravity and evaluation Goal status: INITIAL  5.  Kadee will be able to walk without an assistive device Baseline: Wheeled walker Goal status: INITIAL  6.  Ismael will be independent with her long-term maintenance home exercise program at discharge Baseline: Started 01/09/2024 Goal status: INITIAL   PLAN:  PT FREQUENCY: 2-3 times a week  PT DURATION: 12 weeks  PLANNED INTERVENTIONS: 97110-Therapeutic exercises, 97530- Therapeutic activity, 97112- Neuromuscular re-education, 97535- Self Care, [id number]- Manual therapy, [phone]- Gait training, [phone]- Electrical stimulation (unattended), [id number]- Vasopneumatic device, Patient/Family education, Balance training, Stair training, Joint mobilization, and Cryotherapy  PLAN FOR NEXT SESSION: Post total knee rehabilitation with heavy emphasis on active range of motion.   Myer LELON Ivory, PT, MPT 01/09/2024, 5:15 PM

## 2024-01-10 ENCOUNTER — Ambulatory Visit: Admitting: Rehabilitative and Restorative Service Providers"

## 2024-01-10 ENCOUNTER — Encounter: Payer: Self-pay | Admitting: Rehabilitative and Restorative Service Providers"

## 2024-01-10 DIAGNOSIS — R262 Difficulty in walking, not elsewhere classified: Secondary | ICD-10-CM

## 2024-01-10 DIAGNOSIS — R600 Localized edema: Secondary | ICD-10-CM | POA: Diagnosis not present

## 2024-01-10 DIAGNOSIS — M6281 Muscle weakness (generalized): Secondary | ICD-10-CM | POA: Diagnosis not present

## 2024-01-10 DIAGNOSIS — M25661 Stiffness of right knee, not elsewhere classified: Secondary | ICD-10-CM

## 2024-01-10 DIAGNOSIS — M25561 Pain in right knee: Secondary | ICD-10-CM

## 2024-01-10 NOTE — Therapy (Signed)
 OUTPATIENT PHYSICAL THERAPY LOWER EXTREMITY EVALUATION     Patient Name: Sandra [name] MRN: [id number] DOB:02/05/1965, 58 y.o., female Today's Date: 01/09/2024   END OF SESSION:   PT End of Session - 01/09/24 1659       Visit Number 2     Number of Visits 24     Date for Recertification  04/02/24     Authorization Type [employer]     Progress Note Due on Visit 24     PT Start Time 1302    PT Stop Time 1350    PT Time Calculation (min) 48 min     Activity Tolerance Patient tolerated treatment well;No increased pain;Patient limited by pain     Behavior During Therapy [facility] for tasks assessed/performed                  Past Medical History:  Diagnosis Date   Acute conjunctivitis, right eye 01/31/2022   Acute pain of left shoulder 06/07/2017   Adenomyosis 06/03/2012   Arthritis     Asthma      as a child   Asthma 11/07/2005    Reported Diagnosis in 2012, no formal spirometry    Cardiomyopathy (HCC)      likely nonischemic DCM per Dr. 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   Perimenopausal menorrhagia 02/20/2012   Perimenopausal menorrhagia 02/20/2012   Psoriasis 04/29/2013   Right foot strain 05/15/2021   Right low back pain 03/02/2014   Routine adult health maintenance 09/27/2010   Swelling of abdominal wall 03/02/2014   Swelling of abdominal wall 03/02/2014   Symptoms of upper respiratory infection (URI) 04/01/2018   Tinnitus 06/01/2020   Viral illness 10/08/2013   Viral URI with cough 01/13/2013             Past Surgical History:  Procedure Laterality Date   CHOLECYSTECTOMY   2009   DILATATION & CURETTAGE/HYSTEROSCOPY WITH MYOSURE N/A 12/25/2017    Procedure: DILATATION & CURETTAGE/HYSTEROSCOPY   POLYPECTOMY WITH MYOSURE;  Surgeon: Linnell Devere BRAVO, MD;  Location: [facility];  Service: Gynecology;  Laterality: N/A;   TOTAL KNEE ARTHROPLASTY Right 12/23/2023    Procedure: RIGHT TOTAL KNEE ARTHROPLASTY;  Surgeon: [name], MD;  Location: [facility];  Service: Orthopedics;  Laterality: Right;   TUBAL LIGATION                Patient Active Problem List    Diagnosis Date Noted   Status post total right knee replacement 12/23/2023   Primary osteoarthritis of right knee 12/20/2023   Exertional dyspnea 03/27/2023   Macrocytic anemia 08/08/2022   Trigger index finger of left hand 08/08/2022   Pigmented skin lesion of uncertain behavior of head 02/01/2022   Post-viral cough syndrome 10/29/2019   GERD (gastroesophageal reflux  disease) 10/15/2019   Generalized anxiety disorder 09/21/2019   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   Influenza 02/23/2016   Psoriasis 04/29/2013   Morbid obesity (HCC) 05/16/2012   Chronic HFrEF (heart failure with reduced ejection fraction) (HCC) 02/22/2012   LBBB (left bundle branch block) 01/24/2012   Routine adult health maintenance 09/27/2010   Type 2 diabetes mellitus with other specified complication (HCC) 11/17/2009   Hyperlipidemia 08/20/2007   Essential hypertension 07/08/2006   Asthma 11/07/2005      PCP: Armando Rossetti, MD   REFERRING PROVIDER: Ronal LITTIE Grave, PA-C   REFERRING DIAG:  [phone] (ICD-10-CM) - Status post total right knee replacement      THERAPY DIAG:  Difficulty in walking, not elsewhere classified - Plan: PT plan of care cert/re-cert   Muscle weakness (generalized) - Plan: PT plan of care cert/re-cert   Stiffness of right knee, not elsewhere classified - Plan: PT plan of care cert/re-cert   Localized edema - Plan: PT plan of care cert/re-cert   Acute pain of right knee - Plan: PT plan of care cert/re-cert   Rationale for Evaluation and Treatment: Rehabilitation   ONSET DATE: 12/23/2023   SUBJECTIVE:    SUBJECTIVE STATEMENT: Sandra [name] reports a bit better sleep last night.  She also reports good HEP compliance.  Sandra [name] is getting about 3 hours of sleep uninterrupted.     PERTINENT HISTORY: Type 2 diabetes, HLD, HTN, asthma    PAIN:  Are you having pain? Yes: NPRS scale: Remains 5-7/10 this week Pain location: Rt knee  Pain description: Ache, stiff, sore, sharp Aggravating factors: Prolonged postures, sleeping and bending Relieving factors: Pain meds, ice   PRECAUTIONS: None   RED FLAGS: None      WEIGHT BEARING RESTRICTIONS: No   FALLS:  Has patient fallen in last 6 months? No   LIVING ENVIRONMENT: Lives with: lives with their family and lives with their spouse Lives in: House/apartment Stairs: OK with a  rail, but needs work Has following equipment at home: Environmental Consultant - 2 wheeled   OCCUPATION: Designer, jewellery   PLOF: Independent   PATIENT GOALS: Normal motion, walk without an assistive device, return to work without restrictions, sleep normally   NEXT MD VISIT: 02/04/2023   OBJECTIVE:  Note: Objective measures were completed at Evaluation unless otherwise noted.   DIAGNOSTIC FINDINGS: Right knee arthroplasty without immediate postoperative complication.   PATIENT SURVEYS:  PSFS: THE PATIENT SPECIFIC FUNCTIONAL SCALE   Place score of 0-10 (0 = unable to perform activity and 10 = able to perform activity at the same level as before injury or problem)   Activity Date: 01/09/2024      [place] the right knee 2      2.  Walk 2      3.  Stand  4      4.  Sleep 3      Total Score 2.75          Total Score = Sum of activity scores/number of activities   Minimally Detectable Change: 3 points (for single activity); 2 points (for average score)   [employer] (nd). The Patient Specific Functional Scale . Retrieved from [email or web address]    COGNITION: Overall cognitive status: Within functional limits for tasks assessed                         SENSATION: Mazel had no complaints of peripheral pain or paresthesias   EDEMA:  Noted and not objectively assessed     LOWER EXTREMITY ROM:   Active ROM Left/Right 01/09/2024  Right 01/10/2024  Hip flexion      Hip extension      Hip abduction      Hip adduction      Hip internal rotation      Hip external rotation      Knee flexion 134/70  84  Knee extension 0/-14  -7  Ankle dorsiflexion      Ankle plantarflexion      Ankle inversion      Ankle eversion       (Blank rows = not tested)   LOWER EXTREMITY STRENGTH:   MMT Left/Right 01/09/2024    Hip flexion      Hip extension      Hip abduction      Hip adduction      Hip internal rotation      Hip external  rotation      Knee flexion      Knee extension 4+/2+    Ankle dorsiflexion      Ankle plantarflexion      Ankle inversion      Ankle eversion       (Blank rows = not tested)   GAIT: Distance walked: 100 feet Assistive device utilized: Environmental Consultant - 2 wheeled Level of assistance: Complete Independence Comments: Daphyne would like to be assistive device free                                                                                                                              TREATMENT DATE:  01/10/2024 Recumbent bike Seat 5 AAROM for 5 minutes  Supine quadriceps sets with a heel prop under the right heel 2 sets of 10 for 5 seconds Seated knee flexion active assisted range of motion with left pushing the right into flexion 2 sets of 5 for 10 seconds TKE between reps of seated knee flexion 2 sets of 5 for 3 seconds  Functional Activities: Double leg Press 15 reps full range with stretch into flexion and extension 50# Single leg Press 10 reps full range with stretch into flexion and extension 25#  Vaso right knee 10 minutes High pressure 34 degrees   01/09/2024 Supine  quadriceps sets with a heel prop under the right heel 2 sets of 10 for 5 seconds Seated knee flexion active assisted range of motion with left pushing the right into flexion 2 sets of 5 for 10 seconds TKE between reps of seated knee flexion 2 sets of 5 for 3 seconds   97535: Discussed the early emphasis on active range of motion, quadriceps strength and edema control; emphasized to Jaicee that although this is a very painful process, is going to hurt whether she participates or not so it is worth her time to fully participate to meet long-term goals.   Vaso right knee 10 minutes medium pressure 34 degrees     PATIENT EDUCATION:  Education details: See above Person educated: Patient Education method: Explanation, Demonstration, Tactile cues, Verbal cues, and Handouts Education comprehension: verbalized  understanding, returned demonstration, verbal cues required, tactile cues required, and needs further education   HOME EXERCISE PROGRAM: Access Code: [email or web address] URL: [email or web address] Date: 01/09/2024 Prepared by: Lamar Ivory   Exercises - Supine Quadricep Sets  - 5 x daily - 7 x weekly - 2 sets - 10 reps - 5 second hold - Seated Knee Flexion AAROM  - 5 x daily - 7 x weekly - 2 sets - 5 reps - 10 seconds hold - Seated Long Arc Quad  - 5 x daily - 7 x weekly - 2 sets - 5 reps   ASSESSMENT:   CLINICAL IMPRESSION: Prince continues to give great effort with her supervised PT.  We reinforced the early emphasis on active range of motion, quadriceps strength and edema control along with at least 5 x per day HEP compliance.  Continue current POC.  Patient is a 58 y.o. female who was seen today for physical therapy evaluation and treatment for  Z96.651 (ICD-10-CM) - Status post total right knee replacement  .  Morris did a great job in the office today and had improved active range of motion when she left as compared to objective visits listed above.  I strongly encouraged her to get her exercises at least 5 times a day and to push herself as she is going to have pain whether or not she completes her home exercises and she might as well get the benefit associated with home exercise program participation.  Shiza understood this and based on her efforts today is a good rehabilitation candidate.   OBJECTIVE IMPAIRMENTS: Abnormal gait, decreased activity tolerance, decreased endurance, decreased knowledge of condition, difficulty walking, decreased ROM, decreased strength, increased edema, impaired perceived functional ability, obesity, and pain.    ACTIVITY LIMITATIONS: bending, standing, squatting, sleeping, stairs, dressing, and locomotion level   PARTICIPATION LIMITATIONS: cleaning, interpersonal relationship, driving, community activity, and occupation   PERSONAL FACTORS:  Type 2 diabetes, HLD, HTN, asthma are also affecting patient's functional outcome.    REHAB POTENTIAL: Good   CLINICAL DECISION MAKING: Stable/uncomplicated   EVALUATION COMPLEXITY: Low     GOALS: Goals reviewed with patient? Yes   SHORT TERM GOALS: Target date: 02/20/2024 [place] will be independent with her day 1 home exercise program Baseline: Started 01/09/2024 Goal status: Met 01/10/2024   2.  Improve right knee active range of motion to 0 - 5 - 95 degrees Baseline: 0 - 14 - 70 degrees Goal status: Ongoing 01/10/2024   3.  Tanetta will be able to extend her knee against gravity with no quadriceps lag Baseline: Unable at evaluation Goal status: INITIAL     LONG TERM GOALS: Target date: 04/02/2024  Improve patient-specific functional score to at least 6 Baseline: 2.75 Goal status: INITIAL   2.  Nailah will report right knee pain consistently 3/10 or better on the visual analog scale Baseline: 5-7/10 Goal status: INITIAL   3.  Improve right knee active range of motion to 0 - 3 - 110 degrees or better Baseline: 0 - 14 - 70 degrees Goal status: INITIAL   4.  Improve right quadriceps strength as assessed by MMT and functional scores Baseline: Unable to fully extend the knee against gravity and evaluation Goal status: INITIAL   5.  Scarlett will be able to walk without an assistive device Baseline: Wheeled walker Goal status: INITIAL   6.  Alysah will be independent with her long-term maintenance home exercise program at discharge Baseline: Started 01/09/2024 Goal status: INITIAL     PLAN:   PT FREQUENCY: 2-3 times a week   PT DURATION: 12 weeks   PLANNED INTERVENTIONS: 97110-Therapeutic exercises, 97530- Therapeutic activity, 97112- Neuromuscular re-education, 97535- Self Care, [id number]- Manual therapy, [phone]- Gait training, [phone]- Electrical stimulation (unattended), [id number]- Vasopneumatic device, Patient/Family education, Balance training, Stair training, Joint  mobilization, and Cryotherapy   PLAN FOR NEXT SESSION: Post total knee rehabilitation with heavy emphasis on active range of motion.     Myer LELON Ivory, PT, MPT 01/09/2024, 5:15 PM

## 2024-01-13 ENCOUNTER — Other Ambulatory Visit: Payer: Self-pay | Admitting: Physician Assistant

## 2024-01-13 ENCOUNTER — Telehealth: Payer: Self-pay | Admitting: Physician Assistant

## 2024-01-13 MED ORDER — HYDROCODONE-ACETAMINOPHEN 5-325 MG PO TABS: 1.0000 | ORAL_TABLET | Freq: Two times a day (BID) | ORAL | 0 refills | Status: AC | PRN

## 2024-01-13 NOTE — Telephone Encounter (Signed)
 Pt called asking for refill of oxycodone . Please send to [facility]. Pt number is [phone].

## 2024-01-13 NOTE — Telephone Encounter (Signed)
Weaning to norco and sent in

## 2024-01-14 ENCOUNTER — Encounter: Admitting: Physical Therapy

## 2024-01-14 ENCOUNTER — Other Ambulatory Visit: Payer: Self-pay

## 2024-01-14 ENCOUNTER — Emergency Department (HOSPITAL_COMMUNITY)

## 2024-01-14 ENCOUNTER — Emergency Department (HOSPITAL_COMMUNITY)
Admission: EM | Admit: 2024-01-14 | Discharge: 2024-01-14 | Disposition: A | Discharge status: Home / Self Care | Attending: Emergency Medicine | Admitting: Emergency Medicine

## 2024-01-14 ENCOUNTER — Ambulatory Visit: Payer: Self-pay

## 2024-01-14 DIAGNOSIS — D72819 Decreased white blood cell count, unspecified: Secondary | ICD-10-CM | POA: Insufficient documentation

## 2024-01-14 DIAGNOSIS — Z7901 Long term (current) use of anticoagulants: Secondary | ICD-10-CM | POA: Diagnosis not present

## 2024-01-14 DIAGNOSIS — I10 Essential (primary) hypertension: Secondary | ICD-10-CM | POA: Diagnosis not present

## 2024-01-14 DIAGNOSIS — R030 Elevated blood-pressure reading, without diagnosis of hypertension: Secondary | ICD-10-CM | POA: Diagnosis present

## 2024-01-14 DIAGNOSIS — Z794 Long term (current) use of insulin: Secondary | ICD-10-CM | POA: Diagnosis not present

## 2024-01-14 DIAGNOSIS — R110 Nausea: Secondary | ICD-10-CM

## 2024-01-14 LAB — CBC WITH DIFFERENTIAL/PLATELET
Basophils Absolute: 0 K/uL (ref 0.0–0.1)
Basophils Relative: 0 %
Eosinophils Absolute: 0.1 K/uL (ref 0.0–0.5)
Eosinophils Relative: 2 %
HCT: 30.6 % — ABNORMAL LOW (ref 36.0–46.0)
Hemoglobin: 9.9 g/dL — ABNORMAL LOW (ref 12.0–15.0)
Lymphocytes Relative: 21 %
Lymphs Abs: 0.8 K/uL (ref 0.7–4.0)
MCH: 33.4 pg (ref 26.0–34.0)
MCHC: 32.4 g/dL (ref 30.0–36.0)
MCV: 103.4 fL — ABNORMAL HIGH (ref 80.0–100.0)
Monocytes Absolute: 0.3 K/uL (ref 0.1–1.0)
Monocytes Relative: 9 %
Neutro Abs: 2.4 K/uL (ref 1.7–7.7)
Neutrophils Relative %: 68 %
Platelets: 386 K/uL (ref 150–400)
RBC: 2.96 MIL/uL — ABNORMAL LOW (ref 3.87–5.11)
RDW: 13.5 % (ref 11.5–15.5)
WBC: 3.6 K/uL — ABNORMAL LOW (ref 4.0–10.5)
nRBC: 0 % (ref 0.0–0.2)

## 2024-01-14 LAB — BASIC METABOLIC PANEL WITH GFR
Anion gap: 10 (ref 5–15)
BUN: 6 mg/dL (ref 6–20)
CO2: 27 mmol/L (ref 22–32)
Calcium: 9 mg/dL (ref 8.9–10.3)
Chloride: 101 mmol/L (ref 98–111)
Creatinine, Ser: 0.73 mg/dL (ref 0.44–1.00)
GFR, Estimated: >60 mL/min
Glucose, Bld: 106 mg/dL — ABNORMAL HIGH (ref 70–99)
Potassium: 3.8 mmol/L (ref 3.5–5.1)
Sodium: 137 mmol/L (ref 135–145)

## 2024-01-14 LAB — URINALYSIS, ROUTINE W REFLEX MICROSCOPIC
Bacteria, UA: NONE SEEN
Bilirubin Urine: NEGATIVE
Glucose, UA: >=500 mg/dL — AB
Ketones, ur: NEGATIVE mg/dL
Leukocytes,Ua: NEGATIVE
Nitrite: NEGATIVE
Protein, ur: 100 mg/dL — AB
Specific Gravity, Urine: 1.023 (ref 1.005–1.030)
pH: 6 (ref 5.0–8.0)

## 2024-01-14 LAB — TROPONIN T, HIGH SENSITIVITY: Troponin T High Sensitivity: 25 ng/L — ABNORMAL HIGH (ref 0–19)

## 2024-01-14 MED ORDER — DIPHENHYDRAMINE HCL 50 MG/ML IJ SOLN
25.0000 mg | Freq: Once | INTRAMUSCULAR | Status: AC
  Administered 2024-01-14: 25 mg via INTRAVENOUS
  Filled 2024-01-14: qty 1

## 2024-01-14 MED ORDER — PROCHLORPERAZINE EDISYLATE 10 MG/2ML IJ SOLN
10.0000 mg | Freq: Once | INTRAMUSCULAR | Status: AC
  Administered 2024-01-14: 10 mg via INTRAVENOUS
  Filled 2024-01-14: qty 2

## 2024-01-14 MED ORDER — ACETAMINOPHEN 500 MG PO TABS
1000.0000 mg | ORAL_TABLET | Freq: Once | ORAL | Status: AC
  Administered 2024-01-14: 1000 mg via ORAL
  Filled 2024-01-14: qty 2

## 2024-01-14 MED ORDER — KETOROLAC TROMETHAMINE 15 MG/ML IJ SOLN
15.0000 mg | Freq: Once | INTRAMUSCULAR | Status: AC
  Administered 2024-01-14: 15 mg via INTRAVENOUS
  Filled 2024-01-14: qty 1

## 2024-01-14 NOTE — ED Provider Triage Note (Signed)
 Emergency Medicine Provider Triage Evaluation Note  Sandra [name] , a 58 y.o. female  was evaluated in triage.  Pt complains of uncontrolled blood pressure as well as headache.  The patient states that she had a gradual onset of headache yesterday evening that did not resolve with her home opiate pain medication or Tylenol .  She denies any facial droop, focal numbness or weakness, difficulty swallowing or speaking.  No fevers or chills.  She denies any chest pain or shortness of breath.  She endorses nausea.  Headache was not sudden onset or maximal in onset.  Review of Systems  Positive: Headache Negative: SOB, CP, numbness, weakness, fever  Physical Exam  BP (!) 167/88 (BP Location: Left Arm)   Pulse (!) 103   Temp 98.5 F (36.9 C)   Resp 18   SpO2 96%  Gen:   Awake, no distress  Resp:  Normal effort, lungs CTAB Neuro:  CN 2-12 intact, 5/5 strength in all four extremities, intact sensation to light touch MSK:   Moves extremities without difficulty   Medical Decision Making  Medically screening exam initiated at 10:29 AM.  Appropriate orders placed.  JAKYRAH HOLLADAY was informed that the remainder of the evaluation will be completed by another provider, this initial triage assessment does not replace that evaluation, and the importance of remaining in the ED until their evaluation is complete.  Workup initiated for hypertensive emergency from triage. Negative stroke screen. Pt administered Tylenol  for Headache.    Jerrol Agent, MD 01/14/24 1053

## 2024-01-14 NOTE — Telephone Encounter (Signed)
 Per chart, pt is current at the ER.

## 2024-01-14 NOTE — Discharge Instructions (Signed)
 Continue taking your blood pressure medication.  If you start to have another headache I recommend Tylenol  and ibuprofen .  If the high blood pressure continues have follow-up with your primary care for medication change.  If you start to develop any chest pain, shortness of breath, dizziness, worsening headache return to the ER.

## 2024-01-14 NOTE — ED Provider Notes (Signed)
 " [place] EMERGENCY DEPARTMENT AT [facility] Provider Note   CSN: [id number] Arrival date & time: 01/14/24  9068     Patient presents with: Hypertension, Headache, and Nausea   Sandra [name] is a 58 y.o. female.    Hypertension Associated symptoms include headaches.  Headache 58 year old female presenting with high blood pressure.  Patient reports that she talked with her primary care today and said she was having high blood pressure and a headache she was sent to the ER.  Patient reports that she took her blood pressure medication this morning.  She has not had any recent changes to her blood pressure medication.  She is not having any chest pain or shortness of breath.  She reports a little bit of nausea without any vomiting.  She reports after receiving Tylenol  in the waiting room her headache has gotten little bit better but is starting to get worse again.     Prior to Admission medications  Medication Sig Start Date End Date Taking? Authorizing Provider  apixaban  (ELIQUIS ) 2.5 MG TABS tablet Take 1 tablet by mouth twice daily for 30 days after surgery to prevent blood clots. 12/23/23   Jule Ronal CROME, PA-C  atorvastatin  (LIPITOR ) 80 MG tablet Take 1 tablet by mouth once daily 12/23/23   Azadegan, Maryam, MD  budesonide -formoterol  (SYMBICORT ) 160-4.5 MCG/ACT inhaler Inhale 1 puff into the lungs 2 (two) times daily as needed. 08/15/23   Azadegan, Maryam, MD  chlorhexidine  (HIBICLENS ) 4 % external liquid Apply 15 mLs (1 Application total) topically as directed for 30 doses. Use as directed daily for 5 days every other week for 6 weeks. 12/23/23   [name], MD  clobetasol (TEMOVATE) 0.05 % external solution Apply 1 Application topically daily. 10/10/23   [provider]  Continuous Glucose Sensor (DEXCOM G7 SENSOR) MISC Place new sensor every 10 days. Use to monitor blood sugar continuously. 04/15/23   Masters, Katie, DO  dapagliflozin  propanediol (FARXIGA ) 10  MG TABS tablet Take 1 tablet (10 mg total) by mouth daily. 10/01/23   [name] T, PA-C  docusate sodium  (COLACE) 100 MG capsule Take 1 capsule (100 mg total) by mouth daily as needed. 12/09/23 12/08/24  Jule Ronal CROME, PA-C  doxycycline  (VIBRA -TABS) 100 MG tablet Take 1 tablet (100 mg total) by mouth 2 (two) times daily. 12/23/23   [name], MD  Dulaglutide  (TRULICITY ) 4.5 MG/0.5ML SOAJ Inject 4.5 mg into the skin once a week. 08/28/23   Celestina Czar, MD  escitalopram  (LEXAPRO ) 10 MG tablet Take 1 tablet (10 mg total) by mouth daily. 09/28/23   Azadegan, Maryam, MD  finasteride (PROSCAR) 5 MG tablet Take 2.5 mg by mouth daily. 07/27/23   [provider]  fluticasone  (FLONASE ) 50 MCG/ACT nasal spray Place 1-2 sprays into both nostrils daily as needed for allergies or rhinitis. 08/15/23   Azadegan, Maryam, MD  hydrALAZINE  (APRESOLINE ) 25 MG tablet Take 1 tablet (25 mg total) by mouth 3 (three) times daily. 06/28/23 12/23/23  [name] T, PA-C  HYDROcodone -acetaminophen  (NORCO/VICODIN) 5-325 MG tablet Take 1-2 tablets by mouth 2 (two) times daily as needed. 01/13/24   Jule Ronal CROME, PA-C  Insulin  Glargine (BASAGLAR  KWIKPEN) 100 UNIT/ML Inject 26 Units into the skin at bedtime. 03/26/23   Atway, Rayann N, DO  metFORMIN  (GLUCOPHAGE ) 1000 MG tablet Take 1 tablet (1,000 mg total) by mouth 2 (two) times daily with a meal. 03/26/23   Atway, Rayann N, DO  methocarbamol  (ROBAXIN ) 750 MG tablet  Take 1 tablet (750 mg total) by mouth 3 (three) times daily as needed. 12/09/23   Jule Ronal CROME, PA-C  metoprolol  (TOPROL -XL) 200 MG 24 hr tablet Take 1 tablet (200 mg total) by mouth daily. 03/26/23   Atway, Rayann N, DO  mupirocin  ointment (BACTROBAN ) 2 % Place 1 Application into the nose 2 (two) times daily for 60 doses. Use as directed 2 times daily for 5 days every other week for 6 weeks. 12/23/23 01/22/24  [name], MD  ondansetron  (ZOFRAN ) 4 MG tablet Take 1 tablet (4 mg total) by mouth every 8  (eight) hours as needed for nausea or vomiting. 12/09/23   Jule Ronal CROME, PA-C  oxyCODONE -acetaminophen  (PERCOCET) 5-325 MG tablet Take 1-2 tablets by mouth every 6 (six) hours as needed. To be taken after surgery 01/07/24   Jule Ronal CROME, PA-C  sacubitril -valsartan  (ENTRESTO ) 97-103 MG Take 1 tablet by mouth twice daily 01/10/24   Nishan, Peter C, MD  spironolactone  (ALDACTONE ) 25 MG tablet Take 1 tablet (25 mg total) by mouth daily. 03/26/23   Atway, Rayann N, DO    Allergies: Drug class [trazodone  and nefazodone] and Sertraline  hcl    Review of Systems  Neurological:  Positive for headaches.  All other systems reviewed and are negative.   Updated Vital Signs BP (!) 167/88 (BP Location: Left Arm)   Pulse (!) 103   Temp 98.5 F (36.9 C)   Resp 18   SpO2 96%   Physical Exam Vitals and nursing note reviewed.  HENT:     Mouth/Throat:     Pharynx: Oropharynx is clear.  Cardiovascular:     Rate and Rhythm: Normal rate.     Pulses: Normal pulses.  Pulmonary:     Effort: Pulmonary effort is normal.     Breath sounds: Normal breath sounds.  Abdominal:     General: Abdomen is flat. Bowel sounds are normal.     Palpations: Abdomen is soft.  Skin:    General: Skin is warm and dry.  Neurological:     General: No focal deficit present.     Mental Status: She is alert.     GCS: GCS eye subscore is 4. GCS verbal subscore is 5. GCS motor subscore is 6.     Sensory: Sensation is intact.     Motor: Motor function is intact.     (all labs ordered are listed, but only abnormal results are displayed) Labs Reviewed  CBC WITH DIFFERENTIAL/PLATELET - Abnormal; Notable for the following components:      Result Value   WBC 3.6 (*)    RBC 2.96 (*)    Hemoglobin 9.9 (*)    HCT 30.6 (*)    MCV 103.4 (*)    All other components within normal limits  BASIC METABOLIC PANEL WITH GFR - Abnormal; Notable for the following components:   Glucose, Bld 106 (*)    All other components within  normal limits  URINALYSIS, ROUTINE W REFLEX MICROSCOPIC - Abnormal; Notable for the following components:   Glucose, UA >=500 (*)    Hgb urine dipstick SMALL (*)    Protein, ur 100 (*)    All other components within normal limits  TROPONIN T, HIGH SENSITIVITY    EKG: None  Radiology: DG Chest 1 View Result Date: 01/14/2024 CLINICAL DATA:  Hypertension with headache and nausea. EXAM: CHEST  1 VIEW COMPARISON:  March 27, 2023 FINDINGS: The cardiac silhouette is mildly enlarged and unchanged in size. Mild linear atelectasis  is noted within the left lung base. No focal consolidation, pleural effusion or pneumothorax is identified. Multilevel degenerative changes are seen throughout the thoracic spine. IMPRESSION: Mild left basilar linear atelectasis. Electronically Signed   By: Suzen Dials M.D.   On: 01/14/2024 12:17     Procedures   Medications Ordered in the ED  prochlorperazine  (COMPAZINE ) injection 10 mg (has no administration in time range)  diphenhydrAMINE  (BENADRYL ) injection 25 mg (has no administration in time range)  acetaminophen  (TYLENOL ) tablet 1,000 mg (1,000 mg Oral Given 01/14/24 1059)                                    Medical Decision Making Amount and/or Complexity of Data Reviewed Labs: ordered.  Risk Prescription drug management.   Impression: 58 year old female presenting with headache.  Differential diagnosis include headache, migraine, dehydration, stroke  Additional History: Patient was able to provide history.  I also reviewed other outpatient notes.  Labs: BMP showed no abnormalities.  CBC shows anemia with hemoglobin of 9.9.  troponin was elevated at 25.  Imaging: Chest x-ray showed mild atelectasis.  EKG showed normal sinus rhythm.  ED Course/Meds: After review of past CBC she is known to have chronic anemia with this being about her normal.  After receiving Tylenol  she reports her headache at a low bit better but started to Bad again.   Patient was then given Benadryl  and Compazine  and Toradol .  After receiving these she reports her headache has gone away completely and she is no longer nauseous.  She likely had a headache that was causing her high blood pressure and nausea.  After receiving the medications her blood pressure went down to 143/65.  Patient's troponin was elevated at 25.  This was however lower than her last appointment back in March when it was 59 and 62.  Patient denies any chest pain.  She also does have a history of heart failure.  Patient remained stable while in the ER.      Final diagnoses:  None    ED Discharge Orders     None          Rosaline Almarie KANDICE DEVONNA 01/14/24 1520    Garrick Charleston, MD 01/14/24 1546  "

## 2024-01-14 NOTE — Therapy (Deleted)
 OUTPATIENT PHYSICAL THERAPY LOWER EXTREMITY TREATMENT     Patient Name: Sandra [name] MRN: [id number] DOB:November 30, 1965, 58 y.o., female Today's Date: 01/09/2024   END OF SESSION:       Past Medical History:  Diagnosis Date   Acute conjunctivitis, right eye 01/31/2022   Acute pain of left shoulder 06/07/2017   Adenomyosis 06/03/2012   Arthritis     Asthma      as a child   Asthma 11/07/2005    Reported Diagnosis in 2012, no formal spirometry    Cardiomyopathy (HCC)      likely nonischemic DCM per Dr. 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   Perimenopausal menorrhagia 02/20/2012   Perimenopausal menorrhagia 02/20/2012   Psoriasis 04/29/2013   Right foot strain 05/15/2021   Right low back pain 03/02/2014   Routine adult health maintenance 09/27/2010   Swelling of abdominal wall 03/02/2014   Swelling of abdominal wall 03/02/2014   Symptoms of upper respiratory infection (URI) 04/01/2018   Tinnitus 06/01/2020   Viral illness 10/08/2013   Viral URI with cough 01/13/2013             Past Surgical History:  Procedure Laterality Date   CHOLECYSTECTOMY   2009   DILATATION & CURETTAGE/HYSTEROSCOPY WITH MYOSURE N/A 12/25/2017    Procedure: DILATATION & CURETTAGE/HYSTEROSCOPY   POLYPECTOMY WITH MYOSURE;  Surgeon: Linnell Devere BRAVO, MD;  Location: [facility];  Service: Gynecology;  Laterality: N/A;   TOTAL KNEE ARTHROPLASTY Right 12/23/2023    Procedure: RIGHT TOTAL KNEE ARTHROPLASTY;  Surgeon: [name], MD;  Location: [facility] OR;  Service: Orthopedics;  Laterality: Right;   TUBAL LIGATION                Patient Active Problem List    Diagnosis Date Noted   Status post total right knee replacement 12/23/2023   Primary osteoarthritis of right knee 12/20/2023   Exertional dyspnea 03/27/2023   Macrocytic anemia 08/08/2022   Trigger index finger of left hand 08/08/2022   Pigmented skin lesion of uncertain behavior of head 02/01/2022   Post-viral cough syndrome 10/29/2019  GERD (gastroesophageal reflux disease) 10/15/2019   Generalized anxiety disorder 09/21/2019   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   Influenza 02/23/2016   Psoriasis 04/29/2013   Morbid obesity (HCC) 05/16/2012   Chronic HFrEF (heart failure with reduced ejection fraction) (HCC) 02/22/2012   LBBB (left bundle branch block) 01/24/2012   Routine adult health maintenance 09/27/2010   Type 2 diabetes mellitus with other specified complication (HCC) 11/17/2009   Hyperlipidemia  08/20/2007   Essential hypertension 07/08/2006   Asthma 11/07/2005      PCP: Armando Rossetti, MD   REFERRING PROVIDER: Ronal LITTIE Grave, PA-C   REFERRING DIAG:  [phone] (ICD-10-CM) - Status post total right knee replacement      THERAPY DIAG:  Difficulty in walking, not elsewhere classified - Plan: PT plan of care cert/re-cert   Muscle weakness (generalized) - Plan: PT plan of care cert/re-cert   Stiffness of right knee, not elsewhere classified - Plan: PT plan of care cert/re-cert   Localized edema - Plan: PT plan of care cert/re-cert   Acute pain of right knee - Plan: PT plan of care cert/re-cert   Rationale for Evaluation and Treatment: Rehabilitation   ONSET DATE: 12/23/2023   SUBJECTIVE:    SUBJECTIVE STATEMENT: *** Sandra [name] reports a bit better sleep last night.  She also reports good HEP compliance.  Sandra [name] is getting about 3 hours of sleep uninterrupted.     PERTINENT HISTORY: Type 2 diabetes, HLD, HTN, asthma    PAIN:  Are you having pain? Yes: NPRS scale: Remains *** 5-7/10 this week Pain location: Rt knee  Pain description: Ache, stiff, sore, sharp Aggravating factors: Prolonged postures, sleeping and bending Relieving factors: Pain meds, ice   PRECAUTIONS: None   RED FLAGS: None      WEIGHT BEARING RESTRICTIONS: No   FALLS:  Has patient fallen in last 6 months? No   LIVING ENVIRONMENT: Lives with: lives with their family and lives with their spouse Lives in: House/apartment Stairs: OK with a rail, but needs work Has following equipment at home: Environmental Consultant - 2 wheeled   OCCUPATION: Designer, jewellery   PLOF: Independent   PATIENT GOALS: Normal motion, walk without an assistive device, return to work without restrictions, sleep normally   NEXT MD VISIT: 02/04/2023   OBJECTIVE:  Note: Objective measures were completed at Evaluation unless otherwise noted.   DIAGNOSTIC FINDINGS: Right knee arthroplasty without immediate  postoperative complication.   PATIENT SURVEYS:  PSFS: THE PATIENT SPECIFIC FUNCTIONAL SCALE   Place score of 0-10 (0 = unable to perform activity and 10 = able to perform activity at the same level as before injury or problem)   Activity Date:  01/09/2024      [facility] the right knee 2      2.  Walk 2      3.  Stand 4      4.  Sleep 3      Total Score 2.75          Total Score = Sum of activity scores/number of activities   Minimally Detectable Change: 3 points (for single activity); 2 points (for average score)   [employer] [facility] (nd). The Patient Specific Functional Scale . Retrieved from [email or web address]    COGNITION: Overall cognitive status: Within functional limits for tasks assessed                         SENSATION: Sandra [name] had no complaints of peripheral pain or paresthesias  EDEMA:  Noted and not objectively assessed   LOWER EXTREMITY ROM:   Active ROM Left/Right 01/09/24  Right  01/10/24  Hip flexion      Hip extension      Hip abduction      Hip adduction      Hip internal rotation      Hip external rotation      Knee flexion 134/70  84  Knee extension 0/-14  -7  Ankle dorsiflexion      Ankle plantarflexion      Ankle inversion      Ankle eversion       (Blank rows = not tested)   LOWER EXTREMITY STRENGTH:   MMT Left/Right 01/09/24    Hip flexion      Hip extension      Hip abduction      Hip adduction      Hip internal rotation      Hip external rotation      Knee flexion      Knee extension 4+/2+    Ankle dorsiflexion      Ankle plantarflexion      Ankle inversion      Ankle eversion       (Blank rows = not tested)   GAIT: Distance walked: 100 feet Assistive device utilized: Environmental Consultant - 2 wheeled Level of assistance: Complete Independence Comments: Sandra [name] would like to be assistive device free                                                                                                                               TREATMENT DATE:  01/14/2024 ***  Recumbent bike Seat 5 AAROM for 5 minutes  Supine quadriceps sets with a heel prop under the right heel 2 sets of 10 for 5 seconds Seated knee flexion active assisted range of motion with left pushing the right into flexion 2 sets of 5 for 10 seconds TKE between reps of seated knee flexion 2 sets of 5 for 3 seconds  Functional Activities: Double leg Press 15 reps full range with stretch into flexion and extension 50# Single leg Press 10 reps full range with stretch into flexion and extension 25#  Vaso right knee 10 minutes High pressure 34 degrees     TREATMENT DATE:  01/10/2024 Recumbent bike Seat 5 AAROM for 5 minutes  Supine quadriceps sets with a heel prop under the right heel 2 sets of 10 for 5 seconds Seated knee flexion active assisted range of motion with left pushing the right into flexion 2 sets of 5 for 10 seconds TKE between reps of seated knee flexion 2 sets of 5 for 3 seconds  Functional Activities: Double leg Press 15 reps full range with stretch into flexion and extension 50# Single leg Press 10 reps full range with stretch into flexion and extension 25#  Vaso right knee 10 minutes High pressure 34 degrees   01/09/2024 Supine quadriceps sets with  a heel prop under the right heel 2 sets of 10 for 5 seconds Seated knee flexion active assisted range of motion with left pushing the right into flexion 2 sets of 5 for 10 seconds TKE between reps of seated knee flexion 2 sets of 5 for 3 seconds   97535: Discussed the early emphasis on active range of motion, quadriceps strength and edema control; emphasized to Sandra [name] that although this is a very painful process, is going to hurt whether she participates or not so it is worth her time to fully participate to meet long-term goals.   Vaso right knee 10 minutes medium pressure 34 degrees     PATIENT EDUCATION:  Education details: See  above Person educated: Patient Education method: Explanation, Demonstration, Tactile cues, Verbal cues, and Handouts Education comprehension: verbalized understanding, returned demonstration, verbal cues required, tactile cues required, and needs further education   HOME EXERCISE PROGRAM: Access Code: [email or web address] URL: [email or web address] Date: 01/09/2024 Prepared by: Lamar Ivory   Exercises - Supine Quadricep Sets  - 5 x daily - 7 x weekly - 2 sets - 10 reps - 5 second hold - Seated Knee Flexion AAROM  - 5 x daily - 7 x weekly - 2 sets - 5 reps - 10 seconds hold - Seated Long Arc Quad  - 5 x daily - 7 x weekly - 2 sets - 5 reps   ASSESSMENT:   CLINICAL IMPRESSION: ***  Sandra [name] continues to give great effort with her supervised PT.  We reinforced the early emphasis on active range of motion, quadriceps strength and edema control along with at least 5 x per day HEP compliance.  Continue current POC.  Evaluation on 01/09/2024:  Patient is a 58 y.o. female who was seen today for physical therapy evaluation and treatment for  Z96.651 (ICD-10-CM) - Status post total right knee replacement  .  Sandra [name] did a great job in the office today and had improved active range of motion when she left as compared to objective visits listed above.  I strongly encouraged her to get her exercises at least 5 times a day and to push herself as she is going to have pain whether or not she completes her home exercises and she might as well get the benefit associated with home exercise program participation.  Sandra [name] understood this and based on her efforts today is a good rehabilitation candidate.   OBJECTIVE IMPAIRMENTS: Abnormal gait, decreased activity tolerance, decreased endurance, decreased knowledge of condition, difficulty walking, decreased ROM, decreased strength, increased edema, impaired perceived functional ability, obesity, and pain.    ACTIVITY LIMITATIONS: bending, standing,  squatting, sleeping, stairs, dressing, and locomotion level   PARTICIPATION LIMITATIONS: cleaning, interpersonal relationship, driving, community activity, and occupation   PERSONAL FACTORS: Type 2 diabetes, HLD, HTN, asthma are also affecting patient's functional outcome.    REHAB POTENTIAL: Good   CLINICAL DECISION MAKING: Stable/uncomplicated   EVALUATION COMPLEXITY: Low     GOALS: Goals reviewed with patient? Yes   SHORT TERM GOALS: Target date: 02/20/2024 Sandra [name] will be independent with her day 1 home exercise program Baseline: Started 01/09/2024 Goal status: Met 01/10/2024   2.  Improve right knee active range of motion to 0 - 5 - 95 degrees Baseline: 0 - 14 - 70 degrees Goal status: Ongoing  01/14/2024   3.  Sandra [name] will be able to extend her knee against gravity with no quadriceps lag Baseline: Unable at evaluation Goal status: Ongoing  01/14/2024     LONG TERM  GOALS: Target date: 04/02/2024   Improve patient-specific functional score to at least 6 Baseline: 2.75 Goal status:  Ongoing  01/14/2024   2.  Sandra [name] will report right knee pain consistently 3/10 or better on the visual analog scale Baseline: 5-7/10 Goal status: Ongoing  01/14/2024   3.  Improve right knee active range of motion to 0 - 3 - 110 degrees or better Baseline: 0 - 14 - 70 degrees Goal status: Ongoing  01/14/2024   4.  Improve right quadriceps strength as assessed by MMT and functional scores Baseline: Unable to fully extend the knee against gravity and evaluation Goal status: Ongoing  01/14/2024   5.  Sandra [name] will be able to walk without an assistive device Baseline: Wheeled walker Goal status: Ongoing  01/14/2024   6.  Sandra [name] will be independent with her long-term maintenance home exercise program at discharge Baseline: Started 01/09/2024 Goal status:   Ongoing  01/14/2024     PLAN:   PT FREQUENCY: 2-3 times a week   PT DURATION: 12 weeks   PLANNED INTERVENTIONS:  97110-Therapeutic exercises, 97530- Therapeutic activity, 97112- Neuromuscular re-education, 97535- Self Care, [id number]- Manual therapy, [phone]- Gait training, [phone]- Electrical stimulation (unattended), 97016- Vasopneumatic device, Patient/Family education, Balance training, Stair training, Joint mobilization, and Cryotherapy   PLAN FOR NEXT SESSION: Post total knee rehabilitation with heavy emphasis on active range of motion.    Grayce Spatz, PT, DPT 01/14/2024, 7:51 AM

## 2024-01-14 NOTE — ED Triage Notes (Signed)
 Pt. Stated, My BP is up since last night. Ive had a headache that is pounding with nausea, I go to [name] and they said to come here.

## 2024-01-14 NOTE — Telephone Encounter (Signed)
 FYI Only or Action Required?: FYI only for provider: ED advised.  Patient was last seen in primary care on 10/24/2023 by Sandra Sharper, DO.  Called Nurse Triage reporting Hypertension. And headache  Symptoms began yesterday.  Interventions attempted: Prescription medications: metoprolol , valsartan .  Symptoms are: gradually worsening.  Triage Disposition: Go to ED Now (Notify PCP)  Patient/caregiver understands and will follow disposition?: Yes  Copied from CRM [id number]. Topic: Clinical - Red Word Triage >> Jan 14, 2024  8:54 AM Sandra [name] wrote: [facility] that prompted transfer to Nurse Triage: Checked blood pressure about 5 mins ago and it was 198/100 blood pressure, headache, she doesn't feel good at all. Now its 165/114. She states when she checks it on different sides its reading differently. Reason for Disposition  [1] Systolic BP >= 160 OR Diastolic >= 100 AND [2] cardiac (e.g., breathing difficulty, chest pain) or neurologic symptoms (e.g., new-onset blurred or double vision, unsteady gait)  Answer Assessment - Initial Assessment Questions 1. BLOOD PRESSURE: What is your blood pressure? Did you take at least two measurements 5 minutes apart?     153/103 at 0904 2. ONSET: When did you take your blood pressure?     Since last night 3. HOW: How did you take your blood pressure? (e.g., automatic home BP monitor, visiting nurse)     Manual cuff 4. HISTORY: Do you have a history of high blood pressure?     yes 5. MEDICINES: Are you taking any medicines for blood pressure? Have you missed any doses recently?     No missed doses 6. OTHER SYMPTOMS: Do you have any symptoms? (e.g., blurred vision, chest pain, difficulty breathing, headache, weakness)     Reports Head ache, blurred vision, weak and cold  Protocols used: Blood Pressure - High-A-AH

## 2024-01-21 ENCOUNTER — Encounter: Payer: Self-pay | Admitting: Rehabilitative and Restorative Service Providers"

## 2024-01-21 ENCOUNTER — Ambulatory Visit: Admitting: Rehabilitative and Restorative Service Providers"

## 2024-01-21 DIAGNOSIS — R262 Difficulty in walking, not elsewhere classified: Secondary | ICD-10-CM

## 2024-01-21 DIAGNOSIS — M25661 Stiffness of right knee, not elsewhere classified: Secondary | ICD-10-CM | POA: Diagnosis not present

## 2024-01-21 DIAGNOSIS — R600 Localized edema: Secondary | ICD-10-CM

## 2024-01-21 DIAGNOSIS — M6281 Muscle weakness (generalized): Secondary | ICD-10-CM | POA: Diagnosis not present

## 2024-01-21 DIAGNOSIS — M25561 Pain in right knee: Secondary | ICD-10-CM

## 2024-01-21 NOTE — Therapy (Signed)
 OUTPATIENT PHYSICAL THERAPY LOWER EXTREMITY TREATMENT     Patient Name: Sandra [name] MRN: [id number] DOB:22-Aug-1965, 58 y.o., female Today's Date: 01/09/2024   END OF SESSION:  PT End of Session - 01/21/24 1256     Visit Number 3    Number of Visits 24    Date for Recertification  04/02/24    Authorization Type [employer]    Progress Note Due on Visit 24    PT Start Time 1256    PT Stop Time 1346    PT Time Calculation (min) 50 min    Activity Tolerance Patient tolerated treatment well;No increased pain;Patient limited by pain    Behavior During Therapy [facility] for tasks assessed/performed              Past Medical History:  Diagnosis Date   Acute conjunctivitis, right eye 01/31/2022   Acute pain of left shoulder 06/07/2017   Adenomyosis 06/03/2012   Arthritis     Asthma      as a child   Asthma 11/07/2005    Reported Diagnosis in 2012, no formal spirometry    Cardiomyopathy (HCC)      likely nonischemic DCM per Dr. 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   Perimenopausal menorrhagia 02/20/2012   Perimenopausal menorrhagia 02/20/2012   Psoriasis 04/29/2013   Right foot strain 05/15/2021   Right low back pain 03/02/2014   Routine adult health maintenance 09/27/2010   Swelling of abdominal wall 03/02/2014   Swelling of abdominal wall 03/02/2014   Symptoms of upper respiratory infection (URI) 04/01/2018   Tinnitus 06/01/2020   Viral illness 10/08/2013   Viral URI with cough 01/13/2013             Past Surgical History:  Procedure Laterality Date   CHOLECYSTECTOMY   2009   DILATATION & CURETTAGE/HYSTEROSCOPY WITH MYOSURE N/A 12/25/2017    Procedure: DILATATION & CURETTAGE/HYSTEROSCOPY   POLYPECTOMY WITH MYOSURE;  Surgeon: Sandra Devere BRAVO, MD;  Location: [facility];  Service: Gynecology;  Laterality: N/A;   TOTAL KNEE ARTHROPLASTY Right 12/23/2023    Procedure:  RIGHT TOTAL KNEE ARTHROPLASTY;  Surgeon: Sandra Kay HERO, MD;  Location: [facility];  Service: Orthopedics;  Laterality: Right;   TUBAL LIGATION                Patient Active Problem List    Diagnosis Date Noted   Status post total right knee replacement 12/23/2023   Primary osteoarthritis of right knee 12/20/2023   Exertional dyspnea 03/27/2023   Macrocytic anemia 08/08/2022   Trigger index finger of left hand 08/08/2022   Pigmented skin lesion of uncertain behavior of head 02/01/2022   Post-viral cough syndrome 10/29/2019   GERD (gastroesophageal reflux disease) 10/15/2019    Generalized anxiety disorder 09/21/2019   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   Influenza 02/23/2016   Psoriasis 04/29/2013   Morbid obesity (HCC) 05/16/2012   Chronic HFrEF (heart failure with reduced ejection fraction) (HCC) 02/22/2012   LBBB (left bundle branch block) 01/24/2012   Routine adult health maintenance 09/27/2010   Type 2 diabetes mellitus with other specified complication (HCC) 11/17/2009   Hyperlipidemia 08/20/2007   Essential hypertension 07/08/2006   Asthma 11/07/2005      PCP: Sandra Rossetti, MD   REFERRING PROVIDER: Ronal LITTIE Grave, PA-C   REFERRING DIAG:  [phone] (ICD-10-CM) - Status post total right knee replacement      THERAPY DIAG:  Difficulty in walking, not elsewhere classified - Plan: PT plan of care cert/re-cert   Muscle weakness (generalized) - Plan: PT plan of care cert/re-cert   Stiffness of right knee, not elsewhere classified - Plan: PT plan of care cert/re-cert   Localized edema - Plan: PT plan of care cert/re-cert   Acute pain of right knee - Plan: PT plan of care cert/re-cert   Rationale for Evaluation and Treatment: Rehabilitation   ONSET DATE: 12/23/2023   SUBJECTIVE:    SUBJECTIVE STATEMENT: Sandra [name] reports good HEP compliance, although she missed some time with a visit to the ED for blood pressure.  She is improving with her sleep quality.  Sandra [name] is getting about 3 hours of sleep uninterrupted.     PERTINENT HISTORY: Type 2 diabetes, HLD, HTN, asthma    PAIN:  Are you having pain? Yes: NPRS scale: Remains 3-4/10 this week (was 5-7/10) this week Pain location: Rt knee  Pain description: Ache, stiff, sore, sharp Aggravating factors: Prolonged postures, sleeping and bending Relieving factors: Pain meds, ice   PRECAUTIONS: None   RED FLAGS: None      WEIGHT BEARING RESTRICTIONS: No   FALLS:  Has patient fallen in last 6 months? No   LIVING ENVIRONMENT: Lives with: lives with their family and  lives with their spouse Lives in: House/apartment Stairs: OK with a rail, but needs work Has following equipment at home: Environmental Consultant - 2 wheeled   OCCUPATION: Designer, jewellery   PLOF: Independent   PATIENT GOALS: Normal motion, walk without an assistive device, return to work without restrictions, sleep normally   NEXT MD VISIT: 02/04/2023   OBJECTIVE:  Note: Objective measures were completed at Evaluation unless otherwise noted.   DIAGNOSTIC FINDINGS: Right knee arthroplasty without immediate postoperative complication.   PATIENT SURVEYS:  PSFS: THE PATIENT SPECIFIC FUNCTIONAL SCALE   Place score of 0-10 (0 = unable to perform activity and 10 = able to perform activity at the same level as before injury or problem)   Activity Date:  01/09/2024     [facility] the right knee 2      2.  Walk 2  3.  Stand 4      4.  Sleep 3      Total Score 2.75          Total Score = Sum of activity scores/number of activities   Minimally Detectable Change: 3 points (for single activity); 2 points (for average score)   [employer] [facility] (nd). The Patient Specific Functional Scale . Retrieved from [email or web address].[email or web address]    COGNITION: Overall cognitive status: Within functional limits for tasks assessed                         SENSATION: Yaeko had no complaints of peripheral pain or paresthesias   EDEMA:  Noted and not objectively assessed   LOWER EXTREMITY ROM:   Active ROM Left/Right 01/09/24  Right  01/10/24 Right 01/21/2024  Hip flexion       Hip extension       Hip abduction       Hip adduction       Hip internal rotation       Hip external rotation       Knee flexion 134/70  84 96  Knee extension 0/-14  -7 -4  Ankle dorsiflexion       Ankle plantarflexion       Ankle inversion       Ankle eversion        (Blank rows = not tested)   LOWER EXTREMITY STRENGTH:   MMT Left/Right 01/09/24    Hip flexion      Hip  extension      Hip abduction      Hip adduction      Hip internal rotation      Hip external rotation      Knee flexion      Knee extension 4+/2+    Ankle dorsiflexion      Ankle plantarflexion      Ankle inversion      Ankle eversion       (Blank rows = not tested)   GAIT: Distance walked: 100 feet Assistive device utilized: Environmental Consultant - 2 wheeled Level of assistance: Complete Independence Comments: Shailey would like to be assistive device free                                                                                                                              TREATMENT DATE:  01/21/2024 Recumbent bike Seat 5 10 pedals counter clockwise and switch to normal pedaling for 5 minutes Supine quadriceps sets with a heel prop under the right heel 2 sets of 10 for 5 seconds Seated knee flexion active assisted range of motion with left pushing the right into flexion 10 for 10 seconds TKE between reps of seated knee flexion 10 for 3 seconds  Functional Activities: Double leg Press 15 reps full range with stretch into flexion and extension 75# Single leg Press 10 reps full range with stretch into flexion  and extension 37#  Neuromuscular re-education: Tandem balance eyes open 2 x 20 seconds; head moving 4 x 20 seconds; eyes closed 4 x 20 seconds  Vaso right knee 10 minutes High pressure 34 degrees   01/14/2024 Recumbent bike Seat 5 AAROM for 5 minutes  Supine quadriceps sets with a heel prop under the right heel 2 sets of 10 for 5 seconds Seated knee flexion active assisted range of motion with left pushing the right into flexion 2 sets of 5 for 10 seconds TKE between reps of seated knee flexion 2 sets of 5 for 3 seconds  Functional Activities: Double leg Press 15 reps full range with stretch into flexion and extension 50# Single leg Press 10 reps full range with stretch into flexion and extension 25#  Vaso right knee 10 minutes High pressure 34  degrees   01/10/2024 Recumbent bike Seat 5 AAROM for 5 minutes  Supine quadriceps sets with a heel prop under the right heel 2 sets of 10 for 5 seconds Seated knee flexion active assisted range of motion with left pushing the right into flexion 2 sets of 5 for 10 seconds TKE between reps of seated knee flexion 2 sets of 5 for 3 seconds  Functional Activities: Double leg Press 15 reps full range with stretch into flexion and extension 50# Single leg Press 10 reps full range with stretch into flexion and extension 25#  Vaso right knee 10 minutes High pressure 34 degrees   PATIENT EDUCATION:  Education details: See above Person educated: Patient Education method: Explanation, Demonstration, Tactile cues, Verbal cues, and Handouts Education comprehension: verbalized understanding, returned demonstration, verbal cues required, tactile cues required, and needs further education   HOME EXERCISE PROGRAM: Access Code: [email or web address] URL: [email or web address] Date: 01/09/2024 Prepared by: Lamar Ivory   Exercises - Supine Quadricep Sets  - 5 x daily - 7 x weekly - 2 sets - 10 reps - 5 second hold - Seated Knee Flexion AAROM  - 5 x daily - 7 x weekly - 2 sets - 5 reps - 10 seconds hold - Seated Long Arc Quad  - 5 x daily - 7 x weekly - 2 sets - 5 reps   ASSESSMENT:   CLINICAL IMPRESSION: Even with missing some time due to a blood pressure concern, Kelda is doing a great job with her home and supervised PT.  We again discussed  the early emphasis on active range of motion, quadriceps strength and edema control along with at least 5 x per day HEP compliance.  Continue current POC.  Evaluation on 01/09/2024:  Patient is a 58 y.o. female who was seen today for physical therapy evaluation and treatment for  Z96.651 (ICD-10-CM) - Status post total right knee replacement  .  Roshana did a great job in the office today and had improved active range of motion when she left as compared  to objective visits listed above.  I strongly encouraged her to get her exercises at least 5 times a day and to push herself as she is going to have pain whether or not she completes her home exercises and she might as well get the benefit associated with home exercise program participation.  Teylor understood this and based on her efforts today is a good rehabilitation candidate.   OBJECTIVE IMPAIRMENTS: Abnormal gait, decreased activity tolerance, decreased endurance, decreased knowledge of condition, difficulty walking, decreased ROM, decreased strength, increased edema, impaired perceived functional ability, obesity, and pain.    ACTIVITY LIMITATIONS: bending, standing, squatting,  sleeping, stairs, dressing, and locomotion level   PARTICIPATION LIMITATIONS: cleaning, interpersonal relationship, driving, community activity, and occupation   PERSONAL FACTORS: Type 2 diabetes, HLD, HTN, asthma are also affecting patient's functional outcome.    REHAB POTENTIAL: Good   CLINICAL DECISION MAKING: Stable/uncomplicated   EVALUATION COMPLEXITY: Low     GOALS: Goals reviewed with patient? Yes   SHORT TERM GOALS: Target date: 02/20/2024 [place] will be independent with her day 1 home exercise program Baseline: Started 01/09/2024 Goal status: Met 01/10/2024   2.  Improve right knee active range of motion to 0 - 5 - 95 degrees Baseline: 0 - 14 - 70 degrees Goal status: Met 01/21/2024   3.  Conor will be able to extend her knee against gravity with no quadriceps lag Baseline: Unable at evaluation Goal status: Met 01/21/2024     LONG TERM GOALS: Target date: 04/02/2024   Improve patient-specific functional score to at least 6 Baseline: 2.75 Goal status:  Ongoing  01/14/2024   2.  Casidy will report right knee pain consistently 3/10 or better on the visual analog scale Baseline: 5-7/10 Goal status: Ongoing  01/21/2024   3.  Improve right knee active range of motion to 0 - 3 - 110  degrees or better Baseline: 0 - 14 - 70 degrees Goal status: Ongoing  01/21/2024   4.  Improve right quadriceps strength as assessed by MMT and functional scores Baseline: Unable to fully extend the knee against gravity and evaluation Goal status: Ongoing  01/14/2024   5.  [place] will be able to walk without an assistive device Baseline: Wheeled walker Goal status: Ongoing  01/21/2024   6.  [place] will be independent with her long-term maintenance home exercise program at discharge Baseline: Started 01/09/2024 Goal status:   Ongoing  01/21/2024     PLAN:   PT FREQUENCY: 2-3 times a week   PT DURATION: 12 weeks   PLANNED INTERVENTIONS: 97110-Therapeutic exercises, 97530- Therapeutic activity, 97112- Neuromuscular re-education, 97535- Self Care, 02859- Manual therapy, [phone]- Gait training, [phone]- Electrical stimulation (unattended), 97016- Vasopneumatic device, Patient/Family education, Balance training, Stair training, Joint mobilization, and Cryotherapy   PLAN FOR NEXT SESSION: Post total knee rehabilitation with heavy emphasis on active range of motion, quadriceps strength and edema control.    Myer LELON Ivory, PT, MPT 01/21/2024, 1:39 PM

## 2024-01-22 ENCOUNTER — Ambulatory Visit: Payer: Self-pay

## 2024-01-22 NOTE — Telephone Encounter (Signed)
 Patient went to the ER on 12/23/20235 for similar symptoms  153/105 at 1:24pm during triage 159/109 at 1:28pm during triage Patient states she is still having a headache 5 out of 10 on pain scale   Patient is advised that with her symptoms at this time it is recommended that she goes to the Emergency Room.  Patient states she does not want to go back to the Emergency Room.  She states they did not tell her what was causing her headaches.  Patient wanted to make an appointment and this RN advised her that at this time the recommendation is to go back to the ER for further evaluation. Patient wants to make an appointment with her PCP office and wants a call back about making an appointment. She is advised that she can call us  back with any further concerns and if things get worse to call 911.  Patient upset about not being able to make an appointment and states she just wanted to make an appointment with her PCP office. Patient requesting a call back from the office.   FYI Only or Action Required?: Action required by provider: request for appointment, clinical question for provider, update on patient condition, and ER refused--patient wants an appointment with her PCP.  Patient was last seen in primary care on 10/24/2023 by Elicia Sharper, DO.  Called Nurse Triage reporting Headache.  Symptoms began today.  Interventions attempted: OTC medications: Tylenol , Rest, hydration, or home remedies, and Ice/heat application.  Symptoms are: unchanged.  Triage Disposition: Go to ED Now (Notify PCP)  Patient/caregiver understands and will follow disposition?: No, wishes to speak with PCP          Message from Debby BROCKS sent at 01/22/2024 12:53 PM EST  Reason for Triage: Patient states that she has been having worsening headaches for the past 2 weeks. Her blood pressure is 143/85. Last tuesday patient passed by the ER for the same symptoms but it still has not gone away   Reason for  Disposition  [1] Systolic BP >= 160 OR Diastolic >= 100 AND [2] cardiac (e.g., breathing difficulty, chest pain) or neurologic symptoms (e.g., new-onset blurred or double vision, unsteady gait)  Answer Assessment - Initial Assessment Questions Headache started this morning Patient took Tylenol , laid down, and put a heating pad on her head/neck. Patient went to the Emergency Room on 01/14/2024 for high blood pressure/headaches.  143/85 blood pressure earlier today.  153/105 at 1:24pm during triage 159/109 at 1:28pm during triage Patient states she is still having a headache  Patient states she took her blood pressure medications this morning Patient denies chest pain, difficulty breathing, vomiting, diarrhea, new eye pain or vision changes, numbness/weakness of one side of the face/one arm/one leg. 5 out of 10 pain level at this time.  Patient is advised that with her symptoms at this time it is recommended that she goes to the Emergency Room.  Patient states she does not want to go back to the Emergency Room.  She states they did not tell her what was causing her headaches.  Patient wanted to make an appointment and this RN advised her that at this time the recommendation is to go back to the ER for further evaluation. Patient wants to make an appointment with her PCP office and wants a call back about making an appointment. She is advised that she can call us  back with any further concerns and if things get worse to call 911.  Patient upset about not  being able to make an appointment and states she just wanted to make an appointment with her PCP office. Patient requesting a call back from the office.  Protocols used: Blood Pressure - High-A-AH

## 2024-01-24 ENCOUNTER — Ambulatory Visit: Admitting: Rehabilitative and Restorative Service Providers"

## 2024-01-24 ENCOUNTER — Encounter: Payer: Self-pay | Admitting: Rehabilitative and Restorative Service Providers"

## 2024-01-24 DIAGNOSIS — R600 Localized edema: Secondary | ICD-10-CM

## 2024-01-24 DIAGNOSIS — M25561 Pain in right knee: Secondary | ICD-10-CM | POA: Diagnosis not present

## 2024-01-24 DIAGNOSIS — M25661 Stiffness of right knee, not elsewhere classified: Secondary | ICD-10-CM | POA: Diagnosis not present

## 2024-01-24 DIAGNOSIS — R262 Difficulty in walking, not elsewhere classified: Secondary | ICD-10-CM

## 2024-01-24 DIAGNOSIS — M6281 Muscle weakness (generalized): Secondary | ICD-10-CM

## 2024-01-24 NOTE — Telephone Encounter (Signed)
 RTC to patient.  States took Tylenol  and used a heating Pad is feeling a little better.  Wants to be seen by her doctor.  Advised that if symptoms return and worsen to go to the ER.

## 2024-01-24 NOTE — Therapy (Signed)
 OUTPATIENT PHYSICAL THERAPY LOWER EXTREMITY TREATMENT     Patient Name: Sandra [name] MRN: [id number] DOB:Mar 03, 1965, 59 y.o., female Today's Date: 01/09/2024   END OF SESSION:  PT End of Session - 01/24/24 1154     Visit Number 4    Number of Visits 24    Date for Recertification  04/02/24    Authorization Type [employer]    Progress Note Due on Visit 24    PT Start Time 1149    PT Stop Time 1237    PT Time Calculation (min) 48 min    Activity Tolerance Patient tolerated treatment well;No increased pain;Patient limited by pain    Behavior During Therapy [facility] for tasks assessed/performed               Past Medical History:  Diagnosis Date   Acute conjunctivitis, right eye 01/31/2022   Acute pain of left shoulder 06/07/2017   Adenomyosis 06/03/2012   Arthritis     Asthma      as a child   Asthma 11/07/2005    Reported Diagnosis in 2012, no formal spirometry    Cardiomyopathy (HCC)      likely nonischemic DCM per Dr. 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   Perimenopausal menorrhagia 02/20/2012   Perimenopausal menorrhagia 02/20/2012   Psoriasis 04/29/2013   Right foot strain 05/15/2021   Right low back pain 03/02/2014   Routine adult health maintenance 09/27/2010   Swelling of abdominal wall 03/02/2014   Swelling of abdominal wall 03/02/2014   Symptoms of upper respiratory infection (URI) 04/01/2018   Tinnitus 06/01/2020   Viral illness 10/08/2013   Viral URI with cough 01/13/2013             Past Surgical History:  Procedure Laterality Date   CHOLECYSTECTOMY   2009   DILATATION & CURETTAGE/HYSTEROSCOPY WITH MYOSURE N/A 12/25/2017    Procedure: DILATATION & CURETTAGE/HYSTEROSCOPY   POLYPECTOMY WITH MYOSURE;  Surgeon: Linnell Devere BRAVO, MD;  Location: [facility];  Service: Gynecology;  Laterality: N/A;   TOTAL KNEE ARTHROPLASTY Right 12/23/2023  Procedure: RIGHT TOTAL KNEE ARTHROPLASTY;  Surgeon: [name], MD;  Location: [facility];  Service: Orthopedics;  Laterality: Right;   TUBAL LIGATION                Patient Active Problem List    Diagnosis Date Noted   Status post total right knee replacement 12/23/2023   Primary osteoarthritis of right knee 12/20/2023   Exertional dyspnea 03/27/2023   Macrocytic anemia 08/08/2022   Trigger index finger of left hand 08/08/2022   Pigmented skin lesion of uncertain behavior of head 02/01/2022   Post-viral cough syndrome 10/29/2019   GERD (gastroesophageal reflux disease) 10/15/2019    Generalized anxiety disorder 09/21/2019   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   Influenza 02/23/2016   Psoriasis 04/29/2013   Morbid obesity (HCC) 05/16/2012   Chronic HFrEF (heart failure with reduced ejection fraction) (HCC) 02/22/2012   LBBB (left bundle branch block) 01/24/2012   Routine adult health maintenance 09/27/2010   Type 2 diabetes mellitus with other specified complication (HCC) 11/17/2009   Hyperlipidemia 08/20/2007   Essential hypertension 07/08/2006   Asthma 11/07/2005      PCP: Armando Rossetti, MD   REFERRING PROVIDER: Ronal LITTIE Grave, PA-C   REFERRING DIAG:  [phone] (ICD-10-CM) - Status post total right knee replacement      THERAPY DIAG:  Difficulty in walking, not elsewhere classified - Plan: PT plan of care cert/re-cert   Muscle weakness (generalized) - Plan: PT plan of care cert/re-cert   Stiffness of right knee, not elsewhere classified - Plan: PT plan of care cert/re-cert   Localized edema - Plan: PT plan of care cert/re-cert   Acute pain of right knee - Plan: PT plan of care cert/re-cert   Rationale for Evaluation and Treatment: Rehabilitation   ONSET DATE: 12/23/2023   SUBJECTIVE:    SUBJECTIVE STATEMENT: Sandra [name] reports continued difficulty with her blood pressure.  This has affected her HEP participation since her last supervised PT visit.  Sandra [name] is getting about 3 hours of sleep uninterrupted.     PERTINENT HISTORY: Type 2 diabetes, HLD, HTN, asthma    PAIN:  Are you having pain? Yes: NPRS scale: Still 3-4/10 this week (was 5-7/10) this week Pain location: Rt knee  Pain description: Ache, stiff, sore, sharp Aggravating factors: Prolonged postures, sleeping and bending Relieving factors: Pain meds, ice   PRECAUTIONS: None   RED FLAGS: None      WEIGHT BEARING RESTRICTIONS: No   FALLS:  Has patient fallen in last 6 months? No   LIVING ENVIRONMENT: Lives with: lives with their family and lives with their  spouse Lives in: House/apartment Stairs: OK with a rail, but needs work Has following equipment at home: Environmental Consultant - 2 wheeled   OCCUPATION: Designer, jewellery   PLOF: Independent   PATIENT GOALS: Normal motion, walk without an assistive device, return to work without restrictions, sleep normally   NEXT MD VISIT: 02/04/2023   OBJECTIVE:  Note: Objective measures were completed at Evaluation unless otherwise noted.   DIAGNOSTIC FINDINGS: Right knee arthroplasty without immediate postoperative complication.   PATIENT SURVEYS:  PSFS: THE PATIENT SPECIFIC FUNCTIONAL SCALE   Place score of 0-10 (0 = unable to perform activity and 10 = able to perform activity at the same level as before injury or problem)   Activity Date:  01/09/2024     [facility] the right knee 2      2.  Walk 2      3.  Stand 4  4.  Sleep 3      Total Score 2.75          Total Score = Sum of activity scores/number of activities   Minimally Detectable Change: 3 points (for single activity); 2 points (for average score)   [name] (nd). The Patient Specific Functional Scale . Retrieved from [email or web address]    COGNITION: Overall cognitive status: Within functional limits for tasks assessed                         SENSATION: Sandra [name] had no complaints of peripheral pain or paresthesias   EDEMA:  Noted and not objectively assessed   LOWER EXTREMITY ROM:   Active ROM Left/Right 01/09/24  Right  01/10/24 Right 01/21/2024 Right 01/24/2024  Hip flexion        Hip extension        Hip abduction        Hip adduction        Hip internal rotation        Hip external rotation        Knee flexion 134/70  84 96 100  Knee extension 0/-14  -7 -4 -4  Ankle dorsiflexion        Ankle plantarflexion        Ankle inversion        Ankle eversion         (Blank rows = not tested)   LOWER EXTREMITY STRENGTH:   MMT Left/Right 01/09/24    Hip  flexion      Hip extension      Hip abduction      Hip adduction      Hip internal rotation      Hip external rotation      Knee flexion      Knee extension 4+/2+    Ankle dorsiflexion      Ankle plantarflexion      Ankle inversion      Ankle eversion       (Blank rows = not tested)   GAIT: Distance walked: 100 feet Assistive device utilized: Environmental Consultant - 2 wheeled Level of assistance: Complete Independence Comments: Sandra [name] would like to be assistive device free                                                                                                                              TREATMENT DATE:  01/24/2024 Recumbent bike Seat 5 for 5 minutes, full range from the start Supine quadriceps sets with a heel prop under the right heel 2 sets of 10 for 5 seconds Seated knee flexion active assisted range of motion with left pushing the right into flexion 2 sets of 10 for 10 seconds TKE between reps of seated knee flexion 10 for 3 seconds  Functional Activities: Double leg Press 15 reps full range with stretch into flexion and extension 75#, slow eccentrics Single leg Press 10 reps  full range with stretch into flexion and extension 37#  Vaso right knee 10 minutes High pressure 34 degrees   01/21/2024 Recumbent bike Seat 5 10 pedals counter clockwise and switch to normal pedaling for 5 minutes Supine quadriceps sets with a heel prop under the right heel 2 sets of 10 for 5 seconds Seated knee flexion active assisted range of motion with left pushing the right into flexion 10 for 10 seconds TKE between reps of seated knee flexion 10 for 3 seconds  Functional Activities: Double leg Press 15 reps full range with stretch into flexion and extension 75# Single leg Press 10 reps full range with stretch into flexion and extension 37#  Neuromuscular re-education: Tandem balance eyes open 2 x 20 seconds; head moving 4 x 20 seconds; eyes closed 4 x 20 seconds  Vaso right knee 10 minutes High  pressure 34 degrees   01/14/2024 Recumbent bike Seat 5 AAROM for 5 minutes  Supine quadriceps sets with a heel prop under the right heel 2 sets of 10 for 5 seconds Seated knee flexion active assisted range of motion with left pushing the right into flexion 2 sets of 5 for 10 seconds TKE between reps of seated knee flexion 2 sets of 5 for 3 seconds  Functional Activities: Double leg Press 15 reps full range with stretch into flexion and extension 50# Single leg Press 10 reps full range with stretch into flexion and extension 25#  Vaso right knee 10 minutes High pressure 34 degrees   PATIENT EDUCATION:  Education details: See above Person educated: Patient Education method: Explanation, Demonstration, Tactile cues, Verbal cues, and Handouts Education comprehension: verbalized understanding, returned demonstration, verbal cues required, tactile cues required, and needs further education   HOME EXERCISE PROGRAM: Access Code: [email or web address] URL: [email or web address] Date: 01/09/2024 Prepared by: Lamar Ivory   Exercises - Supine Quadricep Sets  - 5 x daily - 7 x weekly - 2 sets - 10 reps - 5 second hold - Seated Knee Flexion AAROM  - 5 x daily - 7 x weekly - 2 sets - 5 reps - 10 seconds hold - Seated Long Arc Quad  - 5 x daily - 7 x weekly - 2 sets - 5 reps   ASSESSMENT:   CLINICAL IMPRESSION: Even with her blood pressure concern, active range of motion improved to 0 - 4 - 100 degrees.  I encouraged Sandra [name] to follow-up with her GP about her BP (seeing Tuesday 01/28/2024) and to try to increase her home exercises.  Early emphasis remains active range of motion, quadriceps strength and edema control along with at least 5 x per day HEP compliance.  Continue current POC.  Evaluation on 01/09/2024:  Patient is a 59 y.o. female who was seen today for physical therapy evaluation and treatment for  Z96.651 (ICD-10-CM) - Status post total right knee replacement  .  Elley did a  great job in the office today and had improved active range of motion when she left as compared to objective visits listed above.  I strongly encouraged her to get her exercises at least 5 times a day and to push herself as she is going to have pain whether or not she completes her home exercises and she might as well get the benefit associated with home exercise program participation.  Eriyanna understood this and based on her efforts today is a good rehabilitation candidate.   OBJECTIVE IMPAIRMENTS: Abnormal gait, decreased activity tolerance, decreased endurance, decreased knowledge of condition, difficulty walking, decreased  ROM, decreased strength, increased edema, impaired perceived functional ability, obesity, and pain.    ACTIVITY LIMITATIONS: bending, standing, squatting, sleeping, stairs, dressing, and locomotion level   PARTICIPATION LIMITATIONS: cleaning, interpersonal relationship, driving, community activity, and occupation   PERSONAL FACTORS: Type 2 diabetes, HLD, HTN, asthma are also affecting patient's functional outcome.    REHAB POTENTIAL: Good   CLINICAL DECISION MAKING: Stable/uncomplicated   EVALUATION COMPLEXITY: Low     GOALS: Goals reviewed with patient? Yes   SHORT TERM GOALS: Target date: 02/20/2024 Varvara will be independent with her day 1 home exercise program Baseline: Started 01/09/2024 Goal status: Met 01/10/2024   2.  Improve right knee active range of motion to 0 - 5 - 95 degrees Baseline: 0 - 14 - 70 degrees Goal status: Met 01/21/2024   3.  Gwendlyn will be able to extend her knee against gravity with no quadriceps lag Baseline: Unable at evaluation Goal status: Met 01/21/2024     LONG TERM GOALS: Target date: 04/02/2024   Improve patient-specific functional score to at least 6 Baseline: 2.75 Goal status:  Ongoing  01/14/2024   2.  Jamira will report right knee pain consistently 3/10 or better on the visual analog scale Baseline: 5-7/10 Goal  status: Ongoing  01/24/2024   3.  Improve right knee active range of motion to 0 - 3 - 110 degrees or better Baseline: 0 - 14 - 70 degrees Goal status: Ongoing  01/24/2024   4.  Improve right quadriceps strength as assessed by MMT and functional scores Baseline: Unable to fully extend the knee against gravity and evaluation Goal status: Ongoing  01/14/2024   5.  Kimberla will be able to walk without an assistive device Baseline: Wheeled walker Goal status: Ongoing  01/24/2024   6.  Aarilyn will be independent with her long-term maintenance home exercise program at discharge Baseline: Started 01/09/2024 Goal status:   Ongoing  01/24/2024     PLAN:   PT FREQUENCY: 2-3 times a week   PT DURATION: 12 weeks   PLANNED INTERVENTIONS: 97110-Therapeutic exercises, 97530- Therapeutic activity, 97112- Neuromuscular re-education, 97535- Self Care, 02859- Manual therapy, [phone]- Gait training, [phone]- Electrical stimulation (unattended), 97016- Vasopneumatic device, Patient/Family education, Balance training, Stair training, Joint mobilization, and Cryotherapy   PLAN FOR NEXT SESSION: Post total knee rehabilitation with heavy emphasis remaining on active range of motion, quadriceps strength and edema control.    Myer LELON Ivory, PT, MPT 01/24/2024, 12:37 PM

## 2024-01-27 ENCOUNTER — Encounter: Payer: Self-pay | Admitting: Physical Therapy

## 2024-01-27 ENCOUNTER — Ambulatory Visit (INDEPENDENT_AMBULATORY_CARE_PROVIDER_SITE_OTHER): Admitting: Physical Therapy

## 2024-01-27 DIAGNOSIS — M6281 Muscle weakness (generalized): Secondary | ICD-10-CM

## 2024-01-27 DIAGNOSIS — M25661 Stiffness of right knee, not elsewhere classified: Secondary | ICD-10-CM

## 2024-01-27 DIAGNOSIS — R262 Difficulty in walking, not elsewhere classified: Secondary | ICD-10-CM

## 2024-01-27 DIAGNOSIS — R600 Localized edema: Secondary | ICD-10-CM

## 2024-01-27 DIAGNOSIS — M25561 Pain in right knee: Secondary | ICD-10-CM

## 2024-01-27 NOTE — Therapy (Signed)
 [facility] [address] [place], [place], [address] Phone: [phone]   Fax:  [phone]  Patient Details  Name: Sandra [name] MRN: [id number] Date of Birth: [birth year] Referring Provider:  Jule Ronal CROME, PA-C  Encounter Date: 01/27/2024  Pt arrived to PT with continued c/o elevated BP as well as headache.  Declined ED last week at recommendation of PCP.   BP 167/97; HR 93  At this time pt also c/o nausea and therefore agreed to defer PT today until after she sees her PCP tomorrow.  Advised of recommendation to go to ED if symptoms worsen as pt is currently refusing.  Pt verbalized understanding and agreement.   Corean JULIANNA Ku, PT, DPT 01/27/2024 8:52 AM    [facility] [address] [place], [place], [address] Phone: [phone]   Fax:  [phone]

## 2024-01-28 ENCOUNTER — Telehealth: Payer: Self-pay | Admitting: Orthopaedic Surgery

## 2024-01-28 ENCOUNTER — Ambulatory Visit (INDEPENDENT_AMBULATORY_CARE_PROVIDER_SITE_OTHER): Payer: Self-pay

## 2024-01-28 VITALS — BP 159/76 | HR 90 | Temp 98.9°F | Ht 60.0 in | Wt 188.4 lb

## 2024-01-28 DIAGNOSIS — Z794 Long term (current) use of insulin: Secondary | ICD-10-CM | POA: Diagnosis not present

## 2024-01-28 DIAGNOSIS — K5900 Constipation, unspecified: Secondary | ICD-10-CM

## 2024-01-28 DIAGNOSIS — I5022 Chronic systolic (congestive) heart failure: Secondary | ICD-10-CM

## 2024-01-28 DIAGNOSIS — I10 Essential (primary) hypertension: Secondary | ICD-10-CM

## 2024-01-28 DIAGNOSIS — I110 Hypertensive heart disease with heart failure: Secondary | ICD-10-CM | POA: Diagnosis not present

## 2024-01-28 DIAGNOSIS — E119 Type 2 diabetes mellitus without complications: Secondary | ICD-10-CM | POA: Diagnosis not present

## 2024-01-28 DIAGNOSIS — R519 Headache, unspecified: Secondary | ICD-10-CM

## 2024-01-28 DIAGNOSIS — Z7984 Long term (current) use of oral hypoglycemic drugs: Secondary | ICD-10-CM | POA: Diagnosis not present

## 2024-01-28 DIAGNOSIS — E1169 Type 2 diabetes mellitus with other specified complication: Secondary | ICD-10-CM

## 2024-01-28 DIAGNOSIS — Z7985 Long-term (current) use of injectable non-insulin antidiabetic drugs: Secondary | ICD-10-CM

## 2024-01-28 LAB — POCT GLYCOSYLATED HEMOGLOBIN (HGB A1C): HbA1c, POC (controlled diabetic range): 6.2 % (ref 0.0–7.0)

## 2024-01-28 LAB — GLUCOSE, CAPILLARY: Glucose-Capillary: 102 mg/dL — ABNORMAL HIGH (ref 70–99)

## 2024-01-28 MED ORDER — POLYETHYLENE GLYCOL 3350 17 G PO PACK: 17.0000 g | PACK | Freq: Every day | ORAL | 0 refills | Status: AC

## 2024-01-28 MED ORDER — PANTOPRAZOLE SODIUM 20 MG PO TBEC: 20.0000 mg | DELAYED_RELEASE_TABLET | Freq: Every day | ORAL | 0 refills | Status: AC

## 2024-01-28 MED ORDER — SACUBITRIL-VALSARTAN 97-103 MG PO TABS: 1.0000 | ORAL_TABLET | Freq: Two times a day (BID) | ORAL | 3 refills | Status: AC

## 2024-01-28 MED ORDER — CALCIUM CARBONATE ANTACID 500 MG PO CHEW: 1.0000 | CHEWABLE_TABLET | Freq: Three times a day (TID) | ORAL | 0 refills | Status: AC

## 2024-01-28 NOTE — Assessment & Plan Note (Signed)
 Type 2 diabetes mellitus with excellent glycemic control, demonstrated by A1c improvement from 7.2% three months ago to 6.2% today. Review of CGM data shows 99% time in range with 0% time in hypoglycemia or significant hyperglycemia, indicating stable control without safety concerns. Patient is adherent to current regimen and denies symptomatic hypoglycemia.  Current Diabetes Medications: Insulin  glargine (Basaglar ) 26 units nightly Dulaglutide  (Trulicity ) 4.5 mg weekly Dapagliflozin  (Farxiga ) 10 mg daily Metformin  1000 mg twice daily  Plan: Continue current diabetes regimen given excellent control Continue CGM use and regular glucose monitoring Encourage continued lifestyle and dietary measures Recheck A1c in 3 months

## 2024-01-28 NOTE — Telephone Encounter (Signed)
 Spoke with patient. Created new note and placed up front for pick up.

## 2024-01-28 NOTE — Telephone Encounter (Signed)
 Pt called wanting to know if she could get a note for an extension on her FMLA because it is expiring and is wondering if she can pick it up today because she is out and about. Call back number is [phone].

## 2024-01-28 NOTE — Assessment & Plan Note (Signed)
 Patient reports several weeks of frontal and occipital headaches radiating into the neck, initially severe enough to prompt an ED visit around the week of Christmas, where symptoms improved with fluids and acetaminophen . Since then, headaches have persisted but are less intense and responsive to Tylenol . Associated with intermittent nausea. She denies focal neurologic deficits, new vision changes from baseline, chest pain, or shortness of breath. Overall presentation suggests a likely tension-type or BP-related headache, though persistence warrants close follow-up.  Plan: Check blood pressure 2 times daily  Continue acetaminophen  as needed Avoid NSAIDs Encourage hydration Monitor frequency, severity, and triggers Reassess in 2 weeks Consider neurology referral if persistent or worsening

## 2024-01-28 NOTE — Assessment & Plan Note (Signed)
 History of hypertension and chronic HFrEF on multidrug therapy. Patient reports adherence to hydralazine , metoprolol , Aldactone , and Entresto . BP today 144/74. BP fluctuations may be contributing to headaches, especially in the setting of recent illness and stress.  Plan: Continue current antihypertensive regimen Home BP monitoring twice daily with log Reevaluate BP in 2 weeks and adjust therapy if needed

## 2024-01-28 NOTE — Patient Instructions (Addendum)
 Thank you, Ms.Laurabeth A Sulser for allowing us  to provide your care today. Today we discussed how you have been feeling, especially your headaches, borderline blood pressure, and stomach discomfort.  Your blood pressure today was a little high, but not dangerous. For now, the most important step is to keep taking your medications exactly as prescribed: hydralazine , metoprolol , Aldactone , and Entresto . Please check your blood pressure twice a day, write it down in the log we gave you, stay well hydrated, and use Tylenol  for your headaches as needed. We will see you back in 2 weeks to review your numbers and see how your headaches are doing. At that time, we can decide if any medication changes or a neurology referral are needed.  We also discussed your bloating and nausea, which may be related to your recent knee surgery, recovery, pain medications, Eliquis , and the stress your body has been under. To help your stomach settle, we started Protonix , Tums, and MiraLAX . Give your body some time to heal and return to balance.  Please come back in 2 weeks, and call us  sooner if your symptoms worsen or if anything feels concerning. We are here to help you feel better..    I have ordered the following labs for you:  Lab Orders         Glucose, capillary         POC Hbg A1C      I have ordered the following medication/changed the following medications:   Stop the following medications: Medications Discontinued During This Encounter  Medication Reason   sacubitril -valsartan  (ENTRESTO ) 97-103 MG Reorder     Start the following medications: Meds ordered this encounter  Medications   calcium  carbonate (TUMS) 500 MG chewable tablet    Sig: Chew 1 tablet (200 mg of elemental calcium  total) by mouth 3 (three) times daily.    Dispense:  60 tablet    Refill:  0   polyethylene glycol (MIRALAX ) 17 g packet    Sig: Take 17 g by mouth daily.    Dispense:  14 each    Refill:  0   pantoprazole  (PROTONIX ) 20  MG tablet    Sig: Take 1 tablet (20 mg total) by mouth daily.    Dispense:  14 tablet    Refill:  0   sacubitril -valsartan  (ENTRESTO ) 97-103 MG    Sig: Take 1 tablet by mouth 2 (two) times daily.    Dispense:  180 tablet    Refill:  3     Follow up: 2 weeks   Remember:   Should you have any questions or concerns please call the internal medicine clinic at [phone].    Armando Rossetti, M.D [facility]

## 2024-01-28 NOTE — Progress Notes (Signed)
 "  CC: Headache and elevated blood pressure  HPI: 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  She reports several weeks of not feeling well, characterized by recurrent headaches, intermittent nausea, episodic hot and clammy sensations, and gastrointestinal bloating with constipation. Her headaches are described as frontal and occipital with radiation into the neck. The initial episode was severe and prompted an ED visit during the week of Christmas, where she was found to have elevated blood pressure and treated with fluids and acetaminophen , resulting in improvement. Since then, the headaches have persisted but are less severe and improve with acetaminophen . She denies focal neurologic deficits, acute vision changes from baseline, chest pain, or shortness of breath.  She expresses concern about elevated blood pressure readings at home. Her BP today is 144/74. She reports adherence to her antihypertensive and heart failure regimen, including Entresto , metoprolol  succinate 200 mg daily, spironolactone  25 mg daily, hydralazine  25 mg TID, and Farxiga  10 mg daily, though acknowledges the complexity of the regimen.  She also describes episodic hot, clammy sensations and restlessness. She has been amenorrheic for many years following prior gynecologic surgery, making menopausal status difficult to assess, though symptoms are consistent with possible vasomotor changes. Additionally, she reports abdominal bloating and constipation, occasionally  alternating with loose stools. She intermittently uses magnesium  for relief and has recently modified her diet. She is not currently on a scheduled bowel regimen or acid suppression therapy.  Regarding her diabetes, she reports excellent glycemic control on insulin  glargine 26 units nightly, Trulicity  4.5 mg weekly, Farxiga  10 mg daily, and metformin  1000 mg BID. Review of her recent CGM report shows excellent time-in-range with no hypoglycemia or hyperglycemia.  She recently completed post-operative Eliquis  2.5 mg BID for DVT prophylaxis following her right total knee arthroplasty and plans to restart aspirin  81 mg daily. She reports no bleeding concerns and is continuing outpatient physical therapy.  Past Medical History:  Diagnosis Date   Acute conjunctivitis, right eye 01/31/2022   Acute pain of left shoulder 06/07/2017   Adenomyosis 06/03/2012   Arthritis    Asthma    as a child   Asthma 11/07/2005   Reported Diagnosis in 2012, no formal spirometry    Cardiomyopathy (HCC)    likely nonischemic DCM per Dr. 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 ASCVD  risk of 14.5%    LBBB (left bundle branch block)    Left sided numbness 10/15/2019   Lipoma 02/23/2020   Menorrhagia    Migraine    Normal CT head (08/06/2006)   Migraine without aura 09/02/2006   Fairly Well-controlled  2 per month  Takes Elitriptan    Migraine without aura 09/02/2006   Fairly Well-controlled  2 per month  Takes Elitriptan      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   Perimenopausal menorrhagia 02/20/2012   Perimenopausal menorrhagia 02/20/2012   Psoriasis 04/29/2013   Right foot strain 05/15/2021   Right low back pain 03/02/2014   Routine adult health maintenance 09/27/2010   Swelling of abdominal wall 03/02/2014   Swelling of abdominal wall 03/02/2014   Symptoms of upper respiratory infection (URI) 04/01/2018   Tinnitus 06/01/2020   Viral illness 10/08/2013   Viral URI with cough 01/13/2013    Medications Ordered Prior to Encounter[1]  Family History  Problem Relation Age of Onset   Hypertension Father    Diabetes Father     Social History   Socioeconomic History   Marital status: Married    Spouse name: Not on file   Number of children: Not on file   Years of education: Not on file   Highest education level: Not on file  Occupational History   Occupation: Building surveyor  Tobacco Use   Smoking status: Never   Smokeless tobacco: Never  Vaping Use   Vaping status: Never Used  Substance and Sexual Activity   Alcohol use: Yes    Alcohol/week: 0.0 standard drinks of alcohol    Comment: occ   Drug use: No   Sexual activity: Yes    Birth control/protection: Surgical  Other Topics Concern   Not on file  Social History Narrative   Studying at [employer] in early childhood education.   Married.   Social Drivers of Health   Tobacco Use: Low Risk (01/27/2024)   Patient History     Smoking Tobacco Use: Never    Smokeless Tobacco Use: Never    Passive Exposure: Not on file  Financial Resource Strain: Low Risk (01/31/2022)   Overall Financial Resource Strain (CARDIA)    Difficulty of Paying Living Expenses: Not hard at all  Food Insecurity: No Food Insecurity (03/27/2023)   Hunger Vital Sign    Worried About Running Out of Food in the Last Year: Never true    Ran Out of Food in the Last Year: Never true  Transportation Needs: No Transportation Needs (03/27/2023)   PRAPARE - Administrator, Civil Service (Medical): No    Lack of Transportation (Non-Medical): No  Physical Activity: Not on file  Stress: No Stress Concern Present (01/31/2022)   [employer] of Occupational Health - Occupational Stress Questionnaire    Feeling of Stress : Not at all  Social Connections: Moderately Integrated (03/27/2023)   Social Connection and Isolation Panel    Frequency of Communication with Friends and Family: More than three times a week    Frequency of Social Gatherings with Friends and Family: More than three times a week    Attends Religious Services: More than 4 times per year    Active Member of [employer] or Organizations: No    Attends Banker Meetings: Never    Marital Status: Married  Catering Manager Violence: Not At Risk (03/27/2023)   Humiliation, Afraid, Rape, and Kick questionnaire    Fear of Current or Ex-Partner: No  Emotionally Abused: No    Physically Abused: No    Sexually Abused: No  Depression (PHQ2-9): Low Risk (10/24/2023)   Depression (PHQ2-9)    PHQ-2 Score: 0  Alcohol Screen: Not on file  Housing: Low Risk (03/27/2023)   Housing Stability Vital Sign    Unable to Pay for Housing in the Last Year: No    Number of Times Moved in the Last Year: 0    Homeless in the Last Year: No  Utilities: Not At Risk (03/27/2023)   [facility] Utilities    Threatened with loss of utilities: No  Health Literacy: Adequate Health Literacy (08/15/2023)    B1300 Health Literacy    Frequency of need for help with medical instructions: Never    Review of Systems: ROS negative except for what is noted on the assessment and plan.  Vitals:   01/28/24 0955 01/28/24 1041  BP: (!) 144/74 (!) 159/76  Pulse: 92 90  Temp: 98.9 F (37.2 C)   TempSrc: Oral   SpO2: 99%   Weight: 188 lb 6.4 oz (85.5 kg)   Height: 5' (1.524 m)     Physical Exam  Physical Exam: General: Well-appearing, no acute distress. CV: Regular rate and rhythm. Pulm: Clear to auscultation bilaterally. Neuro: Alert and oriented 3, no focal deficits. Abd: Soft, nondistended, nontender. MSK: Right knee limited range of motion after total knee arthroplasty  Assessment & Plan:   Headache Patient reports several weeks of frontal and occipital headaches radiating into the neck, initially severe enough to prompt an ED visit around the week of Christmas, where symptoms improved with fluids and acetaminophen . Since then, headaches have persisted but are less intense and responsive to Tylenol . Associated with intermittent nausea. She denies focal neurologic deficits, new vision changes from baseline, chest pain, or shortness of breath. Overall presentation suggests a likely tension-type or BP-related headache, though persistence warrants close follow-up.  Plan: Check blood pressure 2 times daily  Continue acetaminophen  as needed Avoid NSAIDs Encourage hydration Monitor frequency, severity, and triggers Reassess in 2 weeks Consider neurology referral if persistent or worsening  Constipation Gastrointestinal Symptoms (Bloating, Nausea, Constipation)  Reports bloating, intermittent nausea, and constipation, which began after recent right total knee arthroplasty and use of postoperative medications including opioids and Eliquis . Symptoms likely multifactorial from medication effects, reduced mobility, and physiologic stress. No red flag GI symptoms reported.  Plan: Start MiraLAX   as needed for constipation Start Protonix  for gastric protection Use Tums as needed Encourage fluids and gradual return to baseline diet Reassess symptoms in 2 weeks  Essential hypertension History of hypertension and chronic HFrEF on multidrug therapy. Patient reports adherence to hydralazine , metoprolol , Aldactone , and Entresto . BP today 144/74. BP fluctuations may be contributing to headaches, especially in the setting of recent illness and stress.  Plan: Continue current antihypertensive regimen Home BP monitoring twice daily with log Reevaluate BP in 2 weeks and adjust therapy if needed   Patient discussed with Dr. Lovie Armando Rossetti M.D [facility] Internal Medicine, PGY-1 Phone: [phone] Date 01/28/2024 Time 11:02 AM      [1]  Current Outpatient Medications on File Prior to Visit  Medication Sig Dispense Refill   apixaban  (ELIQUIS ) 2.5 MG TABS tablet Take 1 tablet by mouth twice daily for 30 days after surgery to prevent blood clots. 60 tablet 0   atorvastatin  (LIPITOR ) 80 MG tablet Take 1 tablet by mouth once daily 90 tablet 0   budesonide -formoterol  (SYMBICORT ) 160-4.5 MCG/ACT inhaler Inhale 1 puff into the lungs  2 (two) times daily as needed. 1 each 12   chlorhexidine  (HIBICLENS ) 4 % external liquid Apply 15 mLs (1 Application total) topically as directed for 30 doses. Use as directed daily for 5 days every other week for 6 weeks. 946 mL 1   clobetasol (TEMOVATE) 0.05 % external solution Apply 1 Application topically daily.     Continuous Glucose Sensor (DEXCOM G7 SENSOR) MISC Place new sensor every 10 days. Use to monitor blood sugar continuously. 9 each 3   dapagliflozin  propanediol (FARXIGA ) 10 MG TABS tablet Take 1 tablet (10 mg total) by mouth daily. 90 tablet 3   docusate sodium  (COLACE) 100 MG capsule Take 1 capsule (100 mg total) by mouth daily as needed. 30 capsule 2   doxycycline  (VIBRA -TABS) 100 MG tablet Take 1 tablet (100 mg total) by mouth 2 (two)  times daily. 20 tablet 0   Dulaglutide  (TRULICITY ) 4.5 MG/0.5ML SOAJ Inject 4.5 mg into the skin once a week. 4 mL 2   escitalopram  (LEXAPRO ) 10 MG tablet Take 1 tablet (10 mg total) by mouth daily. 90 tablet 0   finasteride (PROSCAR) 5 MG tablet Take 2.5 mg by mouth daily.     fluticasone  (FLONASE ) 50 MCG/ACT nasal spray Place 1-2 sprays into both nostrils daily as needed for allergies or rhinitis. 10 mL 1   hydrALAZINE  (APRESOLINE ) 25 MG tablet Take 1 tablet (25 mg total) by mouth 3 (three) times daily. 270 tablet 3   HYDROcodone -acetaminophen  (NORCO/VICODIN) 5-325 MG tablet Take 1-2 tablets by mouth 2 (two) times daily as needed. 20 tablet 0   Insulin  Glargine (BASAGLAR  KWIKPEN) 100 UNIT/ML Inject 26 Units into the skin at bedtime. 15 mL 3   metFORMIN  (GLUCOPHAGE ) 1000 MG tablet Take 1 tablet (1,000 mg total) by mouth 2 (two) times daily with a meal. 180 tablet 3   methocarbamol  (ROBAXIN ) 750 MG tablet Take 1 tablet (750 mg total) by mouth 3 (three) times daily as needed. 30 tablet 2   metoprolol  (TOPROL -XL) 200 MG 24 hr tablet Take 1 tablet (200 mg total) by mouth daily. 90 tablet 3   ondansetron  (ZOFRAN ) 4 MG tablet Take 1 tablet (4 mg total) by mouth every 8 (eight) hours as needed for nausea or vomiting. 40 tablet 0   oxyCODONE -acetaminophen  (PERCOCET) 5-325 MG tablet Take 1-2 tablets by mouth every 6 (six) hours as needed. To be taken after surgery 20 tablet 0   spironolactone  (ALDACTONE ) 25 MG tablet Take 1 tablet (25 mg total) by mouth daily. 90 tablet 3   No current facility-administered medications on file prior to visit.   "

## 2024-01-28 NOTE — Assessment & Plan Note (Signed)
 Gastrointestinal Symptoms (Bloating, Nausea, Constipation)  Reports bloating, intermittent nausea, and constipation, which began after recent right total knee arthroplasty and use of postoperative medications including opioids and Eliquis . Symptoms likely multifactorial from medication effects, reduced mobility, and physiologic stress. No red flag GI symptoms reported.  Plan: Start MiraLAX  as needed for constipation Start Protonix  for gastric protection Use Tums as needed Encourage fluids and gradual return to baseline diet Reassess symptoms in 2 weeks

## 2024-01-28 NOTE — Telephone Encounter (Signed)
 Sure, whatever she needs.  She is only 4-5 weeks s/p total knee replacement

## 2024-01-29 ENCOUNTER — Encounter: Payer: Self-pay | Admitting: Physical Therapy

## 2024-01-29 ENCOUNTER — Ambulatory Visit: Admitting: Physical Therapy

## 2024-01-29 DIAGNOSIS — M25661 Stiffness of right knee, not elsewhere classified: Secondary | ICD-10-CM | POA: Diagnosis not present

## 2024-01-29 DIAGNOSIS — M25561 Pain in right knee: Secondary | ICD-10-CM

## 2024-01-29 DIAGNOSIS — R600 Localized edema: Secondary | ICD-10-CM | POA: Diagnosis not present

## 2024-01-29 DIAGNOSIS — R262 Difficulty in walking, not elsewhere classified: Secondary | ICD-10-CM

## 2024-01-29 DIAGNOSIS — M6281 Muscle weakness (generalized): Secondary | ICD-10-CM | POA: Diagnosis not present

## 2024-01-29 NOTE — Progress Notes (Signed)
Internal Medicine Clinic Attending  I was physically present during the key portions of the resident provided service and participated in the medical decision making of patient's management care. I reviewed pertinent patient test results.  The assessment, diagnosis, and plan were formulated together and I agree with the documentation in the resident's note.  Mercie Eon, MD

## 2024-01-29 NOTE — Therapy (Signed)
 OUTPATIENT PHYSICAL THERAPY LOWER EXTREMITY TREATMENT     Patient Name: Sandra [name] MRN: [id number] DOB:07-20-1965, 59 y.o., female Today's Date: 01/09/2024   END OF SESSION:  PT End of Session - 01/29/24 1015     Visit Number 5    Number of Visits 24    Date for Recertification  04/02/24    Authorization Type [employer] [name]; 20 visit limit, $35 copay    Progress Note Due on Visit --    PT Start Time 1010    PT Stop Time 1101    PT Time Calculation (min) 51 min    Activity Tolerance Patient tolerated treatment well;No increased pain;Patient limited by pain    Behavior During Therapy Sandra [name] for tasks assessed/performed                Past Medical History:  Diagnosis Date   Acute conjunctivitis, right eye 01/31/2022   Acute pain of left shoulder 06/07/2017   Adenomyosis 06/03/2012   Arthritis     Asthma      as a child   Asthma 11/07/2005    Reported Diagnosis in 2012, no formal spirometry    Cardiomyopathy (HCC)      likely nonischemic DCM per Dr. 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   Perimenopausal menorrhagia 02/20/2012   Perimenopausal menorrhagia 02/20/2012   Psoriasis 04/29/2013   Right foot strain 05/15/2021   Right low back pain 03/02/2014   Routine adult health maintenance 09/27/2010   Swelling of abdominal wall 03/02/2014   Swelling of abdominal wall 03/02/2014   Symptoms of upper respiratory infection (URI) 04/01/2018   Tinnitus 06/01/2020   Viral illness 10/08/2013   Viral URI with cough 01/13/2013             Past Surgical History:  Procedure Laterality Date   CHOLECYSTECTOMY   2009   DILATATION & CURETTAGE/HYSTEROSCOPY WITH MYOSURE N/A 12/25/2017    Procedure: DILATATION & CURETTAGE/HYSTEROSCOPY   POLYPECTOMY WITH MYOSURE;  Surgeon: Sandra Devere BRAVO, MD;  Location: [facility];  Service: Gynecology;  Laterality: N/A;   TOTAL KNEE  ARTHROPLASTY Right 12/23/2023    Procedure: RIGHT TOTAL KNEE ARTHROPLASTY;  Surgeon: Sandra Kay HERO, MD;  Location: [facility];  Service: Orthopedics;  Laterality: Right;   TUBAL LIGATION                Patient Active Problem List    Diagnosis Date Noted   Status post total right knee replacement 12/23/2023   Primary osteoarthritis of right knee 12/20/2023   Exertional dyspnea 03/27/2023   Macrocytic anemia 08/08/2022   Trigger index finger of left hand 08/08/2022   Pigmented skin lesion of uncertain behavior of head 02/01/2022   Post-viral cough syndrome 10/29/2019   GERD  (gastroesophageal reflux disease) 10/15/2019   Generalized anxiety disorder 09/21/2019   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   Influenza 02/23/2016   Psoriasis 04/29/2013   Morbid obesity (HCC) 05/16/2012   Chronic HFrEF (heart failure with reduced ejection fraction) (HCC) 02/22/2012   LBBB (left bundle branch block) 01/24/2012   Routine adult health maintenance 09/27/2010   Type 2 diabetes mellitus with other specified complication (HCC) 11/17/2009   Hyperlipidemia 08/20/2007   Essential hypertension 07/08/2006   Asthma 11/07/2005      PCP: Armando Rossetti, MD   REFERRING PROVIDER: Ronal LITTIE Grave, PA-C   REFERRING DIAG:  [phone] (ICD-10-CM) - Status post total right knee replacement      THERAPY DIAG:  Difficulty in walking, not elsewhere classified  Muscle weakness (generalized)  Stiffness of right knee, not elsewhere classified  Localized edema  Acute pain of right knee    Rationale for Evaluation and Treatment: Rehabilitation   ONSET DATE: 12/23/2023   SUBJECTIVE:    SUBJECTIVE STATEMENT: Doing better, but still having some headaches.  Has to monitor BP for 2 weeks. BP 170/97 after NuStep  [name] is getting about 3 hours of sleep uninterrupted.     PERTINENT HISTORY: Type 2 diabetes, HLD, HTN, asthma    PAIN:  Are you having pain? Yes: NPRS scale: 2/10 Pain location: Rt knee  Pain description: Ache, stiff, sore, sharp Aggravating factors: Prolonged postures, sleeping and bending Relieving factors: Pain meds, ice   PRECAUTIONS: None   RED FLAGS: None      WEIGHT BEARING RESTRICTIONS: No   FALLS:  Has patient fallen in last 6 months? No   LIVING ENVIRONMENT: Lives with: lives with their family and lives with their spouse Lives in: House/apartment Stairs: OK with a rail, but needs work Has following equipment at home: Environmental Consultant - 2 wheeled   OCCUPATION: Designer, jewellery   PLOF: Independent   PATIENT GOALS: Normal motion,  walk without an assistive device, return to work without restrictions, sleep normally   NEXT MD VISIT: 02/04/2023   OBJECTIVE:  Note: Objective measures were completed at Evaluation unless otherwise noted.   DIAGNOSTIC FINDINGS: Right knee arthroplasty without immediate postoperative complication.   PATIENT SURVEYS:  PSFS: THE PATIENT SPECIFIC FUNCTIONAL SCALE   Place score of 0-10 (0 = unable to perform activity and 10 = able to perform activity at the same level as before injury or problem)   Activity Date:  01/09/2024     [name] the right knee 2      2.  Walk 2      3.  Stand 4      4.  Sleep 3      Total Score 2.75          Total Score = Sum of activity scores/number of activities   Minimally Detectable Change: 3 points (  for single activity); 2 points (for average score)   [facility] (nd). The Patient Specific Functional Scale . Retrieved from [email or web address]    COGNITION: Overall cognitive status: Within functional limits for tasks assessed                         SENSATION: Daniell had no complaints of peripheral pain or paresthesias   EDEMA:  Noted and not objectively assessed   LOWER EXTREMITY ROM:   Active ROM Left/Right 01/09/24  Right  01/10/24 Right 01/21/2024 Right 01/24/2024 Right 01/29/24  Knee flexion 134/70  84 96 100 AA: 98 P: 102  Knee extension 0/-14  -7 -4 -4 P: -6 (supine)   (Blank rows = not tested)   LOWER EXTREMITY STRENGTH:   MMT Left/Right 01/09/24    Hip flexion      Hip extension      Hip abduction      Hip adduction      Hip internal rotation      Hip external rotation      Knee flexion      Knee extension 4+/2+    Ankle dorsiflexion      Ankle plantarflexion      Ankle inversion      Ankle eversion       (Blank rows = not tested)   GAIT: Distance walked: 100 feet Assistive device utilized: Environmental Consultant - 2 wheeled Level of assistance: Complete  Independence Comments: Brindy would like to be assistive device free                                                                                                                              TREATMENT DATE:  01/29/24 TherAct NuStep seat 5 x 8 min; L5 Seated LAQ 3# on Rt 3x10 with 3 sec hold AA Rt knee flexion 10x10 sec hold with LLE providing overpressure Supine SAQ 3# 3x10; 5 sec hold for ROM Quad sets with heel prop - moved to long sitting using visual feedback for improved quad activation; also discussed heel prop stretch for home AA heel slides 2x10; with ball and strap (beach ball provided for home)  Modalities Vaso x 10 min; mod pressure, 34 deg to Rt knee   01/24/2024 Recumbent bike Seat 5 for 5 minutes, full range from the start Supine quadriceps sets with a heel prop under the right heel 2 sets of 10 for 5 seconds Seated knee flexion active assisted range of motion with left pushing the right into flexion 2 sets of 10 for 10 seconds TKE between reps of seated knee flexion 10 for 3 seconds  Functional Activities: Double leg Press 15 reps full range with stretch into flexion and extension 75#, slow eccentrics Single leg Press 10 reps full range with stretch into flexion and extension 37#  Vaso right knee 10 minutes High pressure 34 degrees   01/21/2024 Recumbent bike Seat 5 10 pedals counter  clockwise and switch to normal pedaling for 5 minutes Supine quadriceps sets with a heel prop under the right heel 2 sets of 10 for 5 seconds Seated knee flexion active assisted range of motion with left pushing the right into flexion 10 for 10 seconds TKE between reps of seated knee flexion 10 for 3 seconds  Functional Activities: Double leg Press 15 reps full range with stretch into flexion and extension 75# Single leg Press 10 reps full range with stretch into flexion and extension 37#  Neuromuscular re-education: Tandem balance eyes open 2 x 20 seconds; head moving 4 x 20  seconds; eyes closed 4 x 20 seconds  Vaso right knee 10 minutes High pressure 34 degrees   01/14/2024 Recumbent bike Seat 5 AAROM for 5 minutes  Supine quadriceps sets with a heel prop under the right heel 2 sets of 10 for 5 seconds Seated knee flexion active assisted range of motion with left pushing the right into flexion 2 sets of 5 for 10 seconds TKE between reps of seated knee flexion 2 sets of 5 for 3 seconds  Functional Activities: Double leg Press 15 reps full range with stretch into flexion and extension 50# Single leg Press 10 reps full range with stretch into flexion and extension 25#  Vaso right knee 10 minutes High pressure 34 degrees   PATIENT EDUCATION:  Education details: See above Person educated: Patient Education method: Explanation, Demonstration, Tactile cues, Verbal cues, and Handouts Education comprehension: verbalized understanding, returned demonstration, verbal cues required, tactile cues required, and needs further education   HOME EXERCISE PROGRAM: Access Code: [email or web address] URL: [email or web address] Date: 01/09/2024 Prepared by: Lamar Ivory   Exercises - Supine Quadricep Sets  - 5 x daily - 7 x weekly - 2 sets - 10 reps - 5 second hold - Seated Knee Flexion AAROM  - 5 x daily - 7 x weekly - 2 sets - 5 reps - 10 seconds hold - Seated Long Arc Quad  - 5 x daily - 7 x weekly - 2 sets - 5 reps   ASSESSMENT:   CLINICAL IMPRESSION: Pt's ROM appears to be steady and has not significantly decreased despite limitations to activity due to BP.  Encouraged her to continue to work on ROM and quad activation at home as able.  Will continue to benefit from PT to maximize function.  Evaluation on 01/09/2024:  Patient is a 59 y.o. female who was seen today for physical therapy evaluation and treatment for  Z96.651 (ICD-10-CM) - Status post total right knee replacement  .  Ayerim did a great job in the office today and had improved active range of  motion when she left as compared to objective visits listed above.  I strongly encouraged her to get her exercises at least 5 times a day and to push herself as she is going to have pain whether or not she completes her home exercises and she might as well get the benefit associated with home exercise program participation.  Azariah understood this and based on her efforts today is a good rehabilitation candidate.   OBJECTIVE IMPAIRMENTS: Abnormal gait, decreased activity tolerance, decreased endurance, decreased knowledge of condition, difficulty walking, decreased ROM, decreased strength, increased edema, impaired perceived functional ability, obesity, and pain.    ACTIVITY LIMITATIONS: bending, standing, squatting, sleeping, stairs, dressing, and locomotion level   PARTICIPATION LIMITATIONS: cleaning, interpersonal relationship, driving, community activity, and occupation   PERSONAL FACTORS: Type 2 diabetes, HLD, HTN, asthma are also affecting patient's functional  outcome.    REHAB POTENTIAL: Good   CLINICAL DECISION MAKING: Stable/uncomplicated   EVALUATION COMPLEXITY: Low     GOALS: Goals reviewed with patient? Yes   SHORT TERM GOALS: Target date: 02/20/2024 Viney will be independent with her day 1 home exercise program Baseline: Started 01/09/2024 Goal status: Met 01/10/2024   2.  Improve right knee active range of motion to 0 - 5 - 95 degrees Baseline: 0 - 14 - 70 degrees Goal status: Met 01/21/2024   3.  Haleema will be able to extend her knee against gravity with no quadriceps lag Baseline: Unable at evaluation Goal status: Met 01/21/2024     LONG TERM GOALS: Target date: 04/02/2024   Improve patient-specific functional score to at least 6 Baseline: 2.75 Goal status:  Ongoing  01/14/2024   2.  Kayelyn will report right knee pain consistently 3/10 or better on the visual analog scale Baseline: 5-7/10 Goal status: Ongoing  01/24/2024   3.  Improve right knee active  range of motion to 0 - 3 - 110 degrees or better Baseline: 0 - 14 - 70 degrees Goal status: Ongoing  01/24/2024   4.  Improve right quadriceps strength as assessed by MMT and functional scores Baseline: Unable to fully extend the knee against gravity and evaluation Goal status: Ongoing  01/14/2024   5.  Chevella will be able to walk without an assistive device Baseline: Wheeled walker Goal status: Ongoing  01/24/2024   6.  Yilin will be independent with her long-term maintenance home exercise program at discharge Baseline: Started 01/09/2024 Goal status:   Ongoing  01/24/2024     PLAN:   PT FREQUENCY: 2-3 times a week   PT DURATION: 12 weeks   PLANNED INTERVENTIONS: 97110-Therapeutic exercises, 97530- Therapeutic activity, 97112- Neuromuscular re-education, 97535- Self Care, [id number]- Manual therapy, [phone]- Gait training, [phone]- Electrical stimulation (unattended), [id number]- Vasopneumatic device, Patient/Family education, Balance training, Stair training, Joint mobilization, and Cryotherapy   PLAN FOR NEXT SESSION: monitor BP,  Post total knee rehabilitation with heavy emphasis remaining on active range of motion, quadriceps strength and edema control.    Corean JULIANNA Ku, PT, DPT 01/29/2024 10:58 AM

## 2024-01-31 ENCOUNTER — Encounter: Payer: Self-pay | Admitting: Rehabilitative and Restorative Service Providers"

## 2024-01-31 ENCOUNTER — Ambulatory Visit: Admitting: Rehabilitative and Restorative Service Providers"

## 2024-01-31 DIAGNOSIS — M25561 Pain in right knee: Secondary | ICD-10-CM | POA: Diagnosis not present

## 2024-01-31 DIAGNOSIS — M6281 Muscle weakness (generalized): Secondary | ICD-10-CM | POA: Diagnosis not present

## 2024-01-31 DIAGNOSIS — R600 Localized edema: Secondary | ICD-10-CM

## 2024-01-31 DIAGNOSIS — R262 Difficulty in walking, not elsewhere classified: Secondary | ICD-10-CM

## 2024-01-31 DIAGNOSIS — M25661 Stiffness of right knee, not elsewhere classified: Secondary | ICD-10-CM | POA: Diagnosis not present

## 2024-01-31 NOTE — Therapy (Signed)
 OUTPATIENT PHYSICAL THERAPY LOWER EXTREMITY TREATMENT/PROGRESS NOTE  Progress Note Reporting Period 01/09/2024 to 01/31/2024  See note below for Objective Data and Assessment of Progress/Goals.     Patient Name: Sandra [name] MRN: [id number] DOB:04/06/1965, 59 y.o., female Today's Date: 01/09/2024   END OF SESSION:  PT End of Session - 01/31/24 0847     Visit Number 6    Number of Visits 24    Date for Recertification  04/02/24    Authorization Type [employer] [facility]; 20 visit limit, $35 copay    PT Start Time [phone]    PT Stop Time [phone]    PT Time Calculation (min) 50 min    Activity Tolerance Patient tolerated treatment well;No increased pain;Patient limited by pain    Behavior During Therapy [facility] for tasks assessed/performed                 Past Medical History:  Diagnosis Date   Acute conjunctivitis, right eye 01/31/2022   Acute pain of left shoulder 06/07/2017   Adenomyosis 06/03/2012   Arthritis     Asthma      as a child   Asthma 11/07/2005    Reported Diagnosis in 2012, no formal spirometry    Cardiomyopathy (HCC)      likely nonischemic DCM per Dr. 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   Perimenopausal menorrhagia 02/20/2012   Perimenopausal menorrhagia 02/20/2012   Psoriasis 04/29/2013   Right foot strain 05/15/2021   Right low back pain 03/02/2014   Routine adult health maintenance 09/27/2010   Swelling of abdominal wall 03/02/2014   Swelling of abdominal wall 03/02/2014   Symptoms of upper respiratory infection (URI) 04/01/2018   Tinnitus 06/01/2020   Viral illness 10/08/2013   Viral URI with cough 01/13/2013             Past Surgical History:  Procedure Laterality Date   CHOLECYSTECTOMY   2009   DILATATION & CURETTAGE/HYSTEROSCOPY WITH MYOSURE N/A 12/25/2017    Procedure: DILATATION & CURETTAGE/HYSTEROSCOPY   POLYPECTOMY WITH MYOSURE;  Surgeon: Linnell Devere BRAVO, MD;  Location:  [facility];  Service: Gynecology;  Laterality: N/A;   TOTAL KNEE ARTHROPLASTY Right 12/23/2023    Procedure: RIGHT TOTAL KNEE ARTHROPLASTY;  Surgeon: [name], MD;  Location: [facility] OR;  Service: Orthopedics;  Laterality: Right;   TUBAL LIGATION                Patient Active Problem List    Diagnosis Date Noted   Status post total right knee replacement 12/23/2023   Primary osteoarthritis of right knee 12/20/2023   Exertional dyspnea 03/27/2023   Macrocytic anemia 08/08/2022   Trigger index finger of left hand 08/08/2022   Pigmented skin  lesion of uncertain behavior of head 02/01/2022   Post-viral cough syndrome 10/29/2019   GERD (gastroesophageal reflux disease) 10/15/2019   Generalized anxiety disorder 09/21/2019   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   Influenza 02/23/2016   Psoriasis 04/29/2013   Morbid obesity (HCC) 05/16/2012   Chronic HFrEF (heart failure with reduced ejection fraction) (HCC) 02/22/2012   LBBB (left bundle branch block) 01/24/2012   Routine adult health maintenance 09/27/2010   Type 2 diabetes mellitus with other specified complication (HCC) 11/17/2009   Hyperlipidemia 08/20/2007   Essential hypertension 07/08/2006   Asthma 11/07/2005      PCP: Armando Rossetti, MD   REFERRING PROVIDER: Ronal LITTIE Grave, PA-C   REFERRING DIAG:  [phone] (ICD-10-CM) - Status post total right knee replacement      THERAPY DIAG:  Difficulty in walking, not elsewhere classified  Muscle weakness (generalized)  Stiffness of right knee, not elsewhere classified  Localized edema  Acute pain of right knee    Rationale for Evaluation and Treatment: Rehabilitation   ONSET DATE: 12/23/2023   SUBJECTIVE:    SUBJECTIVE STATEMENT: Sandra [name] notes she is closely monitoring her BP at home.  It is still high, but headaches are improving.  She is sleeping on the couch for about 6 hours uninterrupted, which is improved from last week.   PERTINENT HISTORY: Type 2 diabetes, HLD, HTN, asthma    PAIN:  Are you having pain? Yes: NPRS scale: 0-4/10 this week Pain location: Rt knee  Pain description: Ache, stiff, sore, occasional sharp Aggravating factors: Prolonged postures, better with sleeping sleeping and end range motion Relieving factors: Tylenol , ice, muscle relaxer   PRECAUTIONS: None   RED FLAGS: None      WEIGHT BEARING RESTRICTIONS: No   FALLS:  Has patient fallen in last 6 months? No   LIVING ENVIRONMENT: Lives with: lives with their family and lives with their spouse Lives in:  House/apartment Stairs: OK with a rail, but needs work Has following equipment at home: Environmental Consultant - 2 wheeled   OCCUPATION: Designer, jewellery   PLOF: Independent   PATIENT GOALS: Normal motion, walk without an assistive device, return to work without restrictions, sleep normally   NEXT MD VISIT: 02/04/2023   OBJECTIVE:  Note: Objective measures were completed at Evaluation unless otherwise noted.   DIAGNOSTIC FINDINGS: Right knee arthroplasty without immediate postoperative complication.   PATIENT SURVEYS:  PSFS: THE PATIENT SPECIFIC FUNCTIONAL SCALE   Place score of 0-10 (0 = unable to perform activity and 10 = able to perform activity at the same level as before injury or problem)   Activity Date:  01/09/2024 01/31/2024    Bend the right knee 2  5    2.  Walk 2  5    3.  Stand 4  7    4.  Sleep 3  7    Total  Score 2.75  6        Total Score = Sum of activity scores/number of activities   Minimally Detectable Change: 3 points (for single activity); 2 points (for average score)   [name] (nd). The Patient Specific Functional Scale . Retrieved from [email or web address]    COGNITION: Overall cognitive status: Within functional limits for tasks assessed                         SENSATION: Sandra [name] had no complaints of peripheral pain or paresthesias   EDEMA:  Noted and not objectively assessed   LOWER EXTREMITY ROM:   Active ROM Left/Right 01/09/24  Right  01/10/24 Right 01/21/2024 Right 01/24/2024 Right 01/29/24 Right 01/31/2024  Knee flexion 134/70  84 96 100 AA: 98 P: 102 103  Knee extension 0/-14  -7 -4 -4 P: -6 (supine) -3   (Blank rows = not tested)   LOWER EXTREMITY STRENGTH:   MMT Left/Right 01/09/24 Left/Right 01/31/2024   Hip flexion      Hip extension      Hip abduction      Hip adduction      Hip internal rotation      Hip external rotation      Knee flexion      Knee extension 4+/2+   4+/2+  Ankle dorsiflexion      Ankle plantarflexion      Ankle inversion      Ankle eversion       (Blank rows = not tested)   GAIT: Distance walked: 100 feet Assistive device utilized: Environmental Consultant - 2 wheeled Level of assistance: Complete Independence Comments: Sandra [name] would like to be assistive device free                                                                                                                              TREATMENT DATE:  01/31/2024 Recumbent bike Seat 5 for 5 minutes, full range 6 to 7 MPH Supine quadriceps sets with a heel prop under the right heel 2 sets of 10 for 5 seconds Seated knee flexion active assisted range of motion with left pushing the right into flexion 2 sets of 10 for 10 seconds TKE between reps of seated knee flexion 2 sets of 10 for 3 seconds  Functional Activities: Double leg Press 15 reps full range with stretch into flexion and extension 87#, slow eccentrics Single leg Press 10 reps full range with stretch into flexion and extension 43#  Neuromuscular re-education: Tandem balance with eyes open; head turning; eyes closed 4 x each 20 sceonds  Vaso right knee 10 minutes Medium pressure 34 degrees   01/29/24 TherAct NuStep seat 5 x 8 min; L5 Seated LAQ 3# on Rt 3x10 with 3 sec hold AA Rt knee flexion 10x10 sec hold with LLE providing overpressure Supine SAQ 3# 3x10; 5 sec hold for ROM Quad sets with heel prop - moved  to long sitting using visual feedback for improved quad activation; also discussed heel prop stretch for home AA heel slides 2x10; with ball and strap (beach ball provided for home)  Modalities Vaso x 10 min; mod pressure, 34 deg to Rt knee   01/24/2024 Recumbent bike Seat 5 for 5 minutes, full range from the start Supine quadriceps sets with a heel prop under the right heel 2 sets of 10 for 5 seconds Seated knee flexion active assisted range of motion with left pushing the right into flexion 2 sets of 10 for 10 seconds TKE  between reps of seated knee flexion 10 for 3 seconds  Functional Activities: Double leg Press 15 reps full range with stretch into flexion and extension 75#, slow eccentrics Single leg Press 10 reps full range with stretch into flexion and extension 37#  Vaso right knee 10 minutes High pressure 34 degrees   PATIENT EDUCATION:  Education details: See above Person educated: Patient Education method: Explanation, Demonstration, Tactile cues, Verbal cues, and Handouts Education comprehension: verbalized understanding, returned demonstration, verbal cues required, tactile cues required, and needs further education   HOME EXERCISE PROGRAM: Access Code: [email or web address] URL: [email or web address] Date: 01/09/2024 Prepared by: Lamar Ivory   Exercises - Supine Quadricep Sets  - 5 x daily - 7 x weekly - 2 sets - 10 reps - 5 second hold - Seated Knee Flexion AAROM  - 5 x daily - 7 x weekly - 2 sets - 5 reps - 10 seconds hold - Seated Long Arc Quad  - 5 x daily - 7 x weekly - 2 sets - 5 reps   ASSESSMENT:   CLINICAL IMPRESSION: Active range of motion was assessed at 0 - 3 -103 degrees today.  Sandra [name] is now able to complete a full revolution on the stationary bike and is making steady progress on a weekly basis with her active range of motion, strength and functional progressions.  She will benefit from the recommended course of physical therapy to continue to maximize her flexion active range of motion, strength and independence with gait, stairs and other functional activities before transition into more independent rehabilitation.  Evaluation on 01/09/2024:  Patient is a 59 y.o. female who was seen today for physical therapy evaluation and treatment for  Z96.651 (ICD-10-CM) - Status post total right knee replacement  .  Sandra [name] did a great job in the office today and had improved active range of motion when she left as compared to objective visits listed above.  I strongly encouraged  her to get her exercises at least 5 times a day and to push herself as she is going to have pain whether or not she completes her home exercises and she might as well get the benefit associated with home exercise program participation.  Sandra [name] understood this and based on her efforts today is a good rehabilitation candidate.   OBJECTIVE IMPAIRMENTS: Abnormal gait, decreased activity tolerance, decreased endurance, decreased knowledge of condition, difficulty walking, decreased ROM, decreased strength, increased edema, impaired perceived functional ability, obesity, and pain.    ACTIVITY LIMITATIONS: bending, standing, squatting, sleeping, stairs, dressing, and locomotion level   PARTICIPATION LIMITATIONS: cleaning, interpersonal relationship, driving, community activity, and occupation   PERSONAL FACTORS: Type 2 diabetes, HLD, HTN, asthma are also affecting patient's functional outcome.    REHAB POTENTIAL: Good   CLINICAL DECISION MAKING: Stable/uncomplicated   EVALUATION COMPLEXITY: Low     GOALS: Goals reviewed with patient? Yes   SHORT TERM GOALS: Target date: 02/20/2024 Will  will be independent with her day 1 home exercise program Baseline: Started 01/09/2024 Goal status: Met 01/10/2024   2.  Improve right knee active range of motion to 0 - 5 - 95 degrees Baseline: 0 - 14 - 70 degrees Goal status: Met 01/21/2024   3.  Sandra [name] will be able to extend her knee against gravity with no quadriceps lag Baseline: Unable at evaluation Goal status: Met 01/21/2024     LONG TERM GOALS: Target date: 04/02/2024   Improve patient-specific functional score to at least 6 Baseline: 2.75 Goal status: Met 01/31/2024   2.  Sandra [name] will report right knee pain consistently 3/10 or better on the visual analog scale Baseline: 5-7/10 Goal status: Ongoing 01/31/2024   3.  Improve right knee active range of motion to 0 - 3 - 110 degrees or better Baseline: 0 - 14 - 70 degrees Goal status:  Partially met 01/31/2024   4.  Improve right quadriceps strength as assessed by MMT and functional scores Baseline: Unable to fully extend the knee against gravity and evaluation Goal status: Ongoing 01/31/2024   5.  Sandra [name] will be able to walk without an assistive device Baseline: Wheeled walker Goal status: Ongoing 01/31/2024   6.  Sandra [name] will be independent with her long-term maintenance home exercise program at discharge Baseline: Started 01/09/2024 Goal status:   Ongoing 01/31/2024     PLAN:   PT FREQUENCY: 2-3 times a week   PT DURATION: 9 weeks   PLANNED INTERVENTIONS: 97110-Therapeutic exercises, 97530- Therapeutic activity, 97112- Neuromuscular re-education, 97535- Self Care, [id number]- Manual therapy, [phone]- Gait training, [phone]- Electrical stimulation (unattended), [id number]- Vasopneumatic device, Patient/Family education, Balance training, Stair training, Joint mobilization, and Cryotherapy   PLAN FOR NEXT SESSION: Monitor BP,  Post total knee rehabilitation with heavy emphasis remaining on active range of motion, quadriceps strength and edema control.    Myer MICAEL Ivory, PT, MPT 01/31/2024 5:18 PM

## 2024-02-04 ENCOUNTER — Encounter: Payer: Self-pay | Admitting: Physical Therapy

## 2024-02-04 ENCOUNTER — Ambulatory Visit: Admitting: Physician Assistant

## 2024-02-04 ENCOUNTER — Ambulatory Visit: Admitting: Physical Therapy

## 2024-02-04 ENCOUNTER — Ambulatory Visit (INDEPENDENT_AMBULATORY_CARE_PROVIDER_SITE_OTHER): Payer: Self-pay

## 2024-02-04 DIAGNOSIS — M25561 Pain in right knee: Secondary | ICD-10-CM | POA: Diagnosis not present

## 2024-02-04 DIAGNOSIS — M6281 Muscle weakness (generalized): Secondary | ICD-10-CM | POA: Diagnosis not present

## 2024-02-04 DIAGNOSIS — Z96651 Presence of right artificial knee joint: Secondary | ICD-10-CM

## 2024-02-04 DIAGNOSIS — R262 Difficulty in walking, not elsewhere classified: Secondary | ICD-10-CM | POA: Diagnosis not present

## 2024-02-04 DIAGNOSIS — M25661 Stiffness of right knee, not elsewhere classified: Secondary | ICD-10-CM

## 2024-02-04 DIAGNOSIS — R600 Localized edema: Secondary | ICD-10-CM | POA: Diagnosis not present

## 2024-02-04 NOTE — Therapy (Signed)
 OUTPATIENT PHYSICAL THERAPY LOWER EXTREMITY TREATMENT    Patient Name: Sandra [name] MRN: [id number] DOB:November 13, 1965, 59 y.o., female Today's Date: 01/09/2024   END OF SESSION:  PT End of Session - 02/04/24 1018     Visit Number 7    Number of Visits 24    Date for Recertification  04/02/24    Authorization Type [employer] [facility]; 20 visit limit, $35 copay    PT Start Time 1018    PT Stop Time 1115    PT Time Calculation (min) 57 min    Activity Tolerance Patient tolerated treatment well;No increased pain;Patient limited by pain    Behavior During Therapy [facility] for tasks assessed/performed              Past Medical History:  Diagnosis Date   Acute conjunctivitis, right eye 01/31/2022   Acute pain of left shoulder 06/07/2017   Adenomyosis 06/03/2012   Arthritis     Asthma      as a child   Asthma 11/07/2005    Reported Diagnosis in 2012, no formal spirometry    Cardiomyopathy (HCC)      likely nonischemic DCM per Dr. 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   Perimenopausal menorrhagia 02/20/2012   Perimenopausal menorrhagia 02/20/2012   Psoriasis 04/29/2013   Right foot strain 05/15/2021   Right low back pain 03/02/2014   Routine adult health maintenance 09/27/2010   Swelling of abdominal wall 03/02/2014   Swelling of abdominal wall 03/02/2014   Symptoms of upper respiratory infection (URI) 04/01/2018   Tinnitus 06/01/2020   Viral illness 10/08/2013   Viral URI with cough 01/13/2013             Past Surgical History:  Procedure Laterality Date   CHOLECYSTECTOMY   2009   DILATATION & CURETTAGE/HYSTEROSCOPY WITH MYOSURE N/A 12/25/2017    Procedure: DILATATION & CURETTAGE/HYSTEROSCOPY   POLYPECTOMY WITH MYOSURE;  Surgeon: Linnell Devere BRAVO, MD;  Location: [facility];  Service: Gynecology;  Laterality: N/A;   TOTAL KNEE ARTHROPLASTY Right 12/23/2023    Procedure: RIGHT TOTAL KNEE ARTHROPLASTY;  Surgeon: [name], MD;  Location: [name] OR;  Service: Orthopedics;  Laterality: Right;   TUBAL LIGATION                Patient Active Problem List    Diagnosis Date Noted   Status post total right knee replacement 12/23/2023   Primary osteoarthritis of right knee 12/20/2023   Exertional dyspnea 03/27/2023   Macrocytic anemia 08/08/2022   Trigger index finger of left hand 08/08/2022   Pigmented skin lesion of uncertain behavior of head 02/01/2022   Post-viral cough syndrome 10/29/2019   GERD (gastroesophageal reflux disease) 10/15/2019    Generalized anxiety disorder 09/21/2019   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   Influenza 02/23/2016   Psoriasis 04/29/2013   Morbid obesity (HCC) 05/16/2012   Chronic HFrEF (heart failure with reduced ejection fraction) (HCC) 02/22/2012   LBBB (left bundle branch block) 01/24/2012   Routine adult health maintenance 09/27/2010   Type 2 diabetes mellitus with other specified complication (HCC) 11/17/2009   Hyperlipidemia 08/20/2007   Essential hypertension 07/08/2006   Asthma 11/07/2005      PCP: Armando Rossetti, MD   REFERRING PROVIDER: Ronal LITTIE Grave, PA-C   REFERRING DIAG:  [phone] (ICD-10-CM) - Status post total right knee replacement      THERAPY DIAG:  Difficulty in walking, not elsewhere classified  Muscle weakness (generalized)  Stiffness of right knee, not elsewhere classified  Localized edema  Acute pain of right knee    Rationale for Evaluation and Treatment: Rehabilitation   ONSET DATE: 12/23/2023   SUBJECTIVE:    SUBJECTIVE STATEMENT: Her exercises are going well.  She has been sleeping on couch so she gets good night sleep.     PERTINENT HISTORY: Type 2 diabetes, HLD, HTN, asthma    PAIN:  Are you having pain? Yes: NPRS scale: over last week lowest 0/10 - highest 3/10 Pain location: Rt knee  Pain description: Ache, stiff, sore, occasional sharp Aggravating factors: Prolonged postures, better with sleeping sleeping and end range motion Relieving factors: Tylenol , ice, muscle relaxer   PRECAUTIONS: None   RED FLAGS: None      WEIGHT BEARING RESTRICTIONS: No   FALLS:  Has patient fallen in last 6 months? No   LIVING ENVIRONMENT: Lives with: lives with their family and lives with their spouse Lives in: House/apartment Stairs: OK with a rail, but needs work Has following equipment at home: Environmental Consultant - 2 wheeled   OCCUPATION: Designer, jewellery   PLOF: Independent   PATIENT GOALS: Normal motion, walk without an assistive  device, return to work without restrictions, sleep normally   NEXT MD VISIT: 02/04/2023   OBJECTIVE:  Note: Objective measures were completed at Evaluation unless otherwise noted.   DIAGNOSTIC FINDINGS: Right knee arthroplasty without immediate postoperative complication.   PATIENT SURVEYS:  PSFS: THE PATIENT SPECIFIC FUNCTIONAL SCALE   Place score of 0-10 (0 = unable to perform activity and 10 = able to perform activity at the same level as before injury or problem)   Activity Date:  01/09/2024 01/31/2024    Bend the right knee 2  5    2.  Walk 2  5    3.  Stand 4  7    4.  Sleep 3  7    Total Score 2.75  6        Total Score = Sum of activity scores/number of activities   Minimally Detectable Change: 3 points (for single activity); 2 points (for average score)  [name] (nd). The Patient Specific Functional Scale . Retrieved from [email or web address]    COGNITION: Overall cognitive status: Within functional limits for tasks assessed                         SENSATION: Sandra [name] had no complaints of peripheral pain or paresthesias   EDEMA:  Noted and not objectively assessed   LOWER EXTREMITY ROM:   Active ROM Left/Right 01/09/24  Right  01/10/24 Right 01/21/2024 Right 01/24/2024 Right 01/29/24 Right 01/31/2024  Knee flexion 134/70  84 96 100 AA: 98 P: 102 103  Knee extension 0/-14  -7 -4 -4 P: -6 (supine) -3   (Blank rows = not tested)   LOWER EXTREMITY STRENGTH:   MMT Left/Right 01/09/24 Left/Right 01/31/2024   Hip flexion      Hip extension      Hip abduction      Hip adduction      Hip internal rotation      Hip external rotation      Knee flexion      Knee extension 4+/2+  4+/2+  Ankle dorsiflexion      Ankle plantarflexion      Ankle inversion      Ankle eversion       (Blank rows = not tested)   GAIT: Distance walked: 100 feet Assistive device utilized: Environmental Consultant - 2 wheeled Level of  assistance: Complete Independence Comments: Sandra [name] would like to be assistive device free                                                                                                                              TREATMENT DATE:  02/04/2024 Seated prior to exercise BP 170/93 HR 101  Therapeutic Exercise: SciFit bike Seat 9 for 8 minutes BLEs only Gastroc stretch step heel depression 30 sec 3 reps Hamstring stretch foot on step trunk flexion 30 sec 3 reps Quad stretch standing knee flexion with towel 30 sec 3 reps  Therapeutic Activities: Weight shift to RLE 3 sec hold 5 reps prior to ambulating.  Pt had decrease in antalgic gait with this activity.  PT demo & verbal cues on step up & step down technique. 6 step with BUE support with carryover of technique with verbal cues 10 reps.   Neuromuscular re-education: Tandem stance 30 sec with RLE in front 1st rep & in back 2nd rep; 1st set floor eyes open, 2nd set floor eyes closed, 3rd set foam eyes open.   Self-care: PT demo & verbal cues on ice massage. Pt verbalized and return demo understanding.    Manual Therapy: Soft tissue mobilization with suction cup and instrument assisted scar & joint line mobilizations.   Vaso right knee 10 minutes Medium pressure 34 degrees   01/31/2024 Recumbent bike Seat 5 for 5 minutes, full range 6 to 7 MPH Supine quadriceps sets with a heel prop under the right heel 2  sets of 10 for 5 seconds Seated knee flexion active assisted range of motion with left pushing the right into flexion 2 sets of 10 for 10 seconds TKE between reps of seated knee flexion 2 sets of 10 for 3 seconds  Functional Activities: Double leg Press 15 reps full range with stretch into flexion and extension 87#, slow eccentrics Single leg Press 10 reps full range with stretch into flexion and extension 43#  Neuromuscular re-education: Tandem balance with eyes open; head turning; eyes closed 4 x each 20 sceonds  Vaso right knee  10 minutes Medium pressure 34 degrees   01/29/24 TherAct NuStep seat 5 x 8 min; L5 Seated LAQ 3# on Rt 3x10 with 3 sec hold AA Rt knee flexion 10x10 sec hold with LLE providing overpressure Supine SAQ 3# 3x10; 5 sec hold for ROM Quad sets with heel prop - moved to long sitting using visual feedback for improved quad activation; also discussed heel prop stretch for home AA heel slides 2x10; with ball and strap (beach ball provided for home)  Modalities Vaso x 10 min; mod pressure, 34 deg to Rt knee   01/24/2024 Recumbent bike Seat 5 for 5 minutes, full range from the start Supine quadriceps sets with a heel prop under the right heel 2 sets of 10 for 5 seconds Seated knee flexion active assisted range of motion with left pushing the right into flexion 2 sets of 10 for 10 seconds TKE between reps of seated knee flexion 10 for 3 seconds  Functional Activities: Double leg Press 15 reps full range with stretch into flexion and extension 75#, slow eccentrics Single leg Press 10 reps full range with stretch into flexion and extension 37#  Vaso right knee 10 minutes High pressure 34 degrees   PATIENT EDUCATION:  Education details: See above Person educated: Patient Education method: Explanation, Demonstration, Tactile cues, Verbal cues, and Handouts Education comprehension: verbalized understanding, returned demonstration, verbal cues required, tactile cues required, and needs further education   HOME EXERCISE PROGRAM: Access Code: [email or web address] URL: [email or web address] Date: 01/09/2024 Prepared by: Lamar Ivory   Exercises - Supine Quadricep Sets  - 5 x daily - 7 x weekly - 2 sets - 10 reps - 5 second hold - Seated Knee Flexion AAROM  - 5 x daily - 7 x weekly - 2 sets - 5 reps - 10 seconds hold - Seated Long Arc Quad  - 5 x daily - 7 x weekly - 2 sets - 5 reps   ASSESSMENT:   CLINICAL IMPRESSION: She tolerated session with focus on muscle stretches, functional  activities and soft tissue mobilization.  Her BP was elevated but in range to allow exercises & activities that are not too intense.  Her pain levels appear to be decreasing.    Evaluation on 01/09/2024:  Patient is a 59 y.o. female who was seen today for physical therapy evaluation and treatment for  Z96.651 (ICD-10-CM) - Status post total right knee replacement  .  Sandra [name] did a great job in the office today and had improved active range of motion when she left as compared to objective visits listed above.  I strongly encouraged her to get her exercises at least 5 times a day and to push herself as she is going to have pain whether or not she completes her home exercises and she might as well get the benefit associated with home exercise program participation.  Sandra [name] understood this and based on her efforts today is a good rehabilitation  candidate.   OBJECTIVE IMPAIRMENTS: Abnormal gait, decreased activity tolerance, decreased endurance, decreased knowledge of condition, difficulty walking, decreased ROM, decreased strength, increased edema, impaired perceived functional ability, obesity, and pain.    ACTIVITY LIMITATIONS: bending, standing, squatting, sleeping, stairs, dressing, and locomotion level   PARTICIPATION LIMITATIONS: cleaning, interpersonal relationship, driving, community activity, and occupation   PERSONAL FACTORS: Type 2 diabetes, HLD, HTN, asthma are also affecting patient's functional outcome.    REHAB POTENTIAL: Good   CLINICAL DECISION MAKING: Stable/uncomplicated   EVALUATION COMPLEXITY: Low     GOALS: Goals reviewed with patient? Yes   SHORT TERM GOALS: Target date: 02/20/2024 Sandra [name] will be independent with her day 1 home exercise program Baseline: Started 01/09/2024 Goal status: Met 01/10/2024   2.  Improve right knee active range of motion to 0 - 5 - 95 degrees Baseline: 0 - 14 - 70 degrees Goal status: Met 01/21/2024   3.  Sandra [name] will be able to extend  her knee against gravity with no quadriceps lag Baseline: Unable at evaluation Goal status: Met 01/21/2024     LONG TERM GOALS: Target date: 04/02/2024   Improve patient-specific functional score to at least 6 Baseline: 2.75 Goal status: Met 01/31/2024   2.  Sandra [name] will report right knee pain consistently 3/10 or better on the visual analog scale Baseline: 5-7/10 Goal status: Ongoing 02/04/2024   3.  Improve right knee active range of motion to 0 - 3 - 110 degrees or better Baseline: 0 - 14 - 70 degrees Goal status: Ongoing 02/04/2024   4.  Improve right quadriceps strength as assessed by MMT and functional scores Baseline: Unable to fully extend the knee against gravity and evaluation Goal status: Ongoing 02/04/2024   5.  Sandra [name] will be able to walk without an assistive device Baseline: Wheeled walker Goal status: Ongoing 02/04/2024   6.  Sandra [name] will be independent with her long-term maintenance home exercise program at discharge Baseline: Started 01/09/2024 Goal status:   Ongoing 02/04/2024     PLAN:   PT FREQUENCY: 2-3 times a week   PT DURATION: 9 weeks   PLANNED INTERVENTIONS: 97110-Therapeutic exercises, 97530- Therapeutic activity, 97112- Neuromuscular re-education, 97535- Self Care, [id number]- Manual therapy, [phone]- Gait training, [phone]- Electrical stimulation (unattended), [id number]- Vasopneumatic device, Patient/Family education, Balance training, Stair training, Joint mobilization, and Cryotherapy   PLAN FOR NEXT SESSION:   Monitor BP,  check ROM, Post total knee rehabilitation with heavy emphasis remaining on active range of motion, quadriceps strength and edema control.     Jenson Beedle, PT, DPT 02/04/2024, 11:32 AM

## 2024-02-04 NOTE — Progress Notes (Signed)
 "  Post-Op Visit Note   Patient: Sandra [name]           Date of Birth: [birth year]           MRN: [id number] Visit Date: 02/04/2024 PCP: [name], MD   Assessment & Plan:  Chief Complaint:  Chief Complaint  Patient presents with   Right Knee - Follow-up    Right TKA 12/23/2023   Visit Diagnoses:  1. Status post total right knee replacement     Plan: Patient is a very pleasant 59 year old female who comes in today 6 weeks status post right total knee replacement.  She has been doing okay.  Currently ambulating with a cane.  She does have some discomfort which is relieved with Tylenol  and Robaxin .  She has finished her Eliquis  and has restarted her aspirin  for which she was taking prior to surgery.  She is in physical therapy making good progress.  Examination of the right knee reveals a fully healed surgical scar without complication.  Range of motion from 0 to 100 degrees.  Stable valgus varus stress.  She is neurovascularly intact distally.  At this point, she will continue with physical therapy.  Follow-up in 6 weeks for recheck.  Call with concerns or questions.  Follow-Up Instructions: Return in about 6 weeks (around 03/17/2024).   Orders:  Orders Placed This Encounter  Procedures   XR Knee 1-2 Views Right   No orders of the defined types were placed in this encounter.   Imaging: XR Knee 1-2 Views Right Result Date: 02/04/2024 Well seated prosthesis without complication   PMFS History: Patient Active Problem List   Diagnosis Date Noted   Headache 01/28/2024   Constipation 01/28/2024   Status post total right knee replacement 12/23/2023   Primary osteoarthritis of right knee 12/20/2023   Exertional dyspnea 03/27/2023   Macrocytic anemia 08/08/2022   Trigger index finger of left hand 08/08/2022   Pigmented skin lesion of uncertain behavior of head 02/01/2022   Post-viral cough syndrome 10/29/2019   GERD (gastroesophageal reflux disease) 10/15/2019    Generalized anxiety disorder 09/21/2019   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   Influenza 02/23/2016   Psoriasis 04/29/2013   Morbid obesity (HCC) 05/16/2012   Chronic HFrEF (heart failure with reduced ejection fraction) (HCC) 02/22/2012   LBBB (left bundle branch block) 01/24/2012   Routine adult health maintenance 09/27/2010   Type 2 diabetes mellitus with other specified complication (HCC) 11/17/2009   Hyperlipidemia 08/20/2007   Essential hypertension 07/08/2006   Asthma 11/07/2005   Past Medical History:  Diagnosis Date   Acute conjunctivitis, right eye 01/31/2022   Acute pain of left shoulder 06/07/2017   Adenomyosis 06/03/2012   Arthritis    Asthma    as a child   Asthma 11/07/2005   Reported Diagnosis in 2012, no formal spirometry    Cardiomyopathy (HCC)    likely nonischemic DCM per Dr. 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   Perimenopausal menorrhagia 02/20/2012   Perimenopausal menorrhagia 02/20/2012   Psoriasis 04/29/2013   Right foot strain 05/15/2021   Right low back pain 03/02/2014   Routine adult health maintenance 09/27/2010   Swelling of abdominal wall 03/02/2014   Swelling of abdominal wall 03/02/2014   Symptoms of upper respiratory infection (URI) 04/01/2018   Tinnitus 06/01/2020   Viral illness 10/08/2013   Viral URI with cough 01/13/2013    Family History  Problem Relation Age of Onset   Hypertension Father    Diabetes Father     Past Surgical History:  Procedure Laterality Date   CHOLECYSTECTOMY  2009   DILATATION & CURETTAGE/HYSTEROSCOPY WITH MYOSURE N/A 12/25/2017   Procedure: DILATATION & CURETTAGE/HYSTEROSCOPY   POLYPECTOMY WITH MYOSURE;  Surgeon: Linnell Devere BRAVO, MD;  Location: [facility];  Service: Gynecology;  Laterality: N/A;   TOTAL KNEE ARTHROPLASTY Right 12/23/2023   Procedure: RIGHT TOTAL KNEE ARTHROPLASTY;  Surgeon: [name], MD;  Location: [facility];  Service: Orthopedics;  Laterality: Right;   TUBAL LIGATION     Social History   Occupational History   Occupation: Building surveyor  Tobacco Use   Smoking status: Never   Smokeless tobacco: Never  Vaping Use   Vaping status: Never Used  Substance and Sexual Activity   Alcohol use: Yes    Alcohol/week: 0.0 standard drinks of alcohol    Comment: occ   Drug use: No   Sexual activity: Yes    Birth control/protection: Surgical     "

## 2024-02-06 ENCOUNTER — Encounter: Admitting: Rehabilitative and Restorative Service Providers"

## 2024-02-06 NOTE — Therapy (Signed)
 OUTPATIENT PHYSICAL THERAPY LOWER EXTREMITY TREATMENT    Patient Name: Sandra [name] MRN: [id number] DOB:1965/03/29, 59 y.o., female Today's Date: 01/09/2024   END OF SESSION:  PT End of Session - 02/07/24 0927     Visit Number 8    Number of Visits 24    Date for Recertification  04/02/24    Authorization Type [employer] [facility]; 20 visit limit, $35 copay    PT Start Time 0845    PT Stop Time 0933    PT Time Calculation (min) 48 min    Activity Tolerance Patient tolerated treatment well    Behavior During Therapy [facility] for tasks assessed/performed               Past Medical History:  Diagnosis Date   Acute conjunctivitis, right eye 01/31/2022   Acute pain of left shoulder 06/07/2017   Adenomyosis 06/03/2012   Arthritis     Asthma      as a child   Asthma 11/07/2005    Reported Diagnosis in 2012, no formal spirometry    Cardiomyopathy (HCC)      likely nonischemic DCM per Dr. 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   Perimenopausal menorrhagia 02/20/2012   Perimenopausal menorrhagia 02/20/2012   Psoriasis 04/29/2013   Right foot strain 05/15/2021   Right low back pain 03/02/2014   Routine adult health maintenance 09/27/2010   Swelling of abdominal wall 03/02/2014   Swelling of abdominal wall 03/02/2014   Symptoms of upper respiratory infection (URI) 04/01/2018   Tinnitus 06/01/2020   Viral illness 10/08/2013   Viral URI with cough 01/13/2013             Past Surgical History:  Procedure Laterality Date   CHOLECYSTECTOMY   2009   DILATATION & CURETTAGE/HYSTEROSCOPY WITH MYOSURE N/A 12/25/2017    Procedure: DILATATION & CURETTAGE/HYSTEROSCOPY   POLYPECTOMY WITH MYOSURE;  Surgeon: Linnell Devere BRAVO, MD;  Location: [facility];  Service: Gynecology;  Laterality: N/A;   TOTAL KNEE ARTHROPLASTY Right 12/23/2023    Procedure: RIGHT TOTAL KNEE ARTHROPLASTY;  Surgeon: [name],  MD;  Location: [facility];  Service: Orthopedics;  Laterality: Right;   TUBAL LIGATION                Patient Active Problem List    Diagnosis Date Noted   Status post total right knee replacement 12/23/2023   Primary osteoarthritis of right knee 12/20/2023   Exertional dyspnea 03/27/2023   Macrocytic anemia 08/08/2022   Trigger index finger of left hand 08/08/2022   Pigmented skin lesion of uncertain behavior of head 02/01/2022   Post-viral cough syndrome 10/29/2019   GERD (gastroesophageal reflux disease) 10/15/2019   Generalized anxiety disorder 09/21/2019    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   Influenza 02/23/2016   Psoriasis 04/29/2013   Morbid obesity (HCC) 05/16/2012   Chronic HFrEF (heart failure with reduced ejection fraction) (HCC) 02/22/2012   LBBB (left bundle branch block) 01/24/2012   Routine adult health maintenance 09/27/2010   Type 2 diabetes mellitus with other specified complication (HCC) 11/17/2009   Hyperlipidemia 08/20/2007   Essential hypertension 07/08/2006   Asthma 11/07/2005      PCP: Armando Rossetti, MD   REFERRING PROVIDER: Ronal LITTIE Grave, PA-C   REFERRING DIAG:  [phone] (ICD-10-CM) - Status post total right knee replacement      THERAPY DIAG:  Difficulty in walking, not elsewhere classified  Muscle weakness (generalized)  Stiffness of right knee, not elsewhere classified  Localized edema  Acute pain of right knee    Rationale for Evaluation and Treatment: Rehabilitation   ONSET DATE: 12/23/2023   SUBJECTIVE:    SUBJECTIVE STATEMENT: Pt reports feeling overall better than last visit.  PERTINENT HISTORY: Type 2 diabetes, HLD, HTN, asthma    PAIN:  Are you having pain? Yes: NPRS scale: today 0/10 but took meds Pain location: Rt knee  Pain description: Ache, stiff, sore, occasional sharp Aggravating factors: Prolonged postures, better with sleeping sleeping and end range motion Relieving factors: Tylenol , ice, muscle relaxer   PRECAUTIONS: None   RED FLAGS: None      WEIGHT BEARING RESTRICTIONS: No   FALLS:  Has patient fallen in last 6 months? No   LIVING ENVIRONMENT: Lives with: lives with their family and lives with their spouse Lives in: House/apartment Stairs: OK with a rail, but needs work Has following equipment at home: Environmental Consultant - 2 wheeled   OCCUPATION: Designer, jewellery   PLOF: Independent   PATIENT GOALS: Normal motion, walk without an assistive device, return to work without restrictions, sleep normally   NEXT MD VISIT: 02/04/2023   OBJECTIVE:   Note: Objective measures were completed at Evaluation unless otherwise noted.   DIAGNOSTIC FINDINGS: Right knee arthroplasty without immediate postoperative complication.   PATIENT SURVEYS:  PSFS: THE PATIENT SPECIFIC FUNCTIONAL SCALE   Place score of 0-10 (0 = unable to perform activity and 10 = able to perform activity at the same level as before injury or problem)   Activity Date:  01/09/2024 01/31/2024    Bend the right knee 2  5    2.  Walk 2  5    3.  Stand 4  7    4.  Sleep 3  7    Total Score 2.75  6        Total Score = Sum of activity scores/number of activities   Minimally Detectable Change: 3 points (for single activity); 2 points (for average score)   [employer] [facility] (nd). The Patient Specific Functional Scale . Retrieved from [email or web address]    COGNITION: Overall  cognitive status: Within functional limits for tasks assessed                         SENSATION: Laury had no complaints of peripheral pain or paresthesias   EDEMA:  Noted and not objectively assessed   LOWER EXTREMITY ROM:   Active ROM Left/Right 01/09/24  Right  01/10/24 Right 01/21/2024 Right 01/24/2024 Right 01/29/24 Right 01/31/2024  Knee flexion 134/70  84 96 100 AA: 98 P: 102 103  Knee extension 0/-14  -7 -4 -4 P: -6 (supine) -3   (Blank rows = not tested)   LOWER EXTREMITY STRENGTH:   MMT Left/Right 01/09/24 Left/Right 01/31/2024   Hip flexion      Hip extension      Hip abduction      Hip adduction      Hip internal rotation      Hip external rotation      Knee flexion      Knee extension 4+/2+  4+/2+  Ankle dorsiflexion      Ankle plantarflexion      Ankle inversion      Ankle eversion       (Blank rows = not tested)   GAIT: Distance walked: 100 feet Assistive device utilized: Environmental Consultant - 2 wheeled Level of assistance: Complete Independence Comments: Aashritha would like to be assistive device free                                                                                                                               TREATMENT DATE:  02/07/24     R TKA Seated BP prior to exercise 162/98 HR 90 LAQ 2# 3x10 Marching with 2# weight Hamstring stretch in sitting with foot on  step 3x25 sec Sci fit 8 min LE only Neuro Re ed Tandem 25sec 4x Tandem walking onAir ex 6 laps UUDD on Air ex   Vaso right knee 10 minutes Medium pressure 34 degrees   02/04/2024 Seated prior to exercise BP 170/93 HR 101  Therapeutic Exercise: SciFit bike Seat 9 for 8 minutes BLEs only Gastroc stretch step heel depression 30 sec 3 reps Hamstring stretch foot on step trunk flexion 30 sec 3 reps Quad stretch standing knee flexion with towel 30 sec 3 reps  Therapeutic Activities: Weight shift to RLE 3 sec hold 5 reps prior to ambulating.  Pt had decrease in antalgic gait with this activity.  PT demo & verbal cues on step up & step down technique. 6 step with BUE support with carryover of technique with verbal cues 10 reps.   Neuromuscular re-education: Tandem stance 30 sec with RLE in front 1st rep & in back 2nd rep; 1st set floor eyes open, 2nd set floor eyes closed, 3rd set foam eyes open.   Self-care: PT demo & verbal cues on ice massage. Pt verbalized and return demo understanding.    Manual Therapy: Soft tissue mobilization with  suction cup and instrument assisted scar & joint line mobilizations.   Vaso right knee 10 minutes Medium pressure 34 degrees   01/31/2024 Recumbent bike Seat 5 for 5 minutes, full range 6 to 7 MPH Supine quadriceps sets with a heel prop under the right heel 2 sets of 10 for 5 seconds Seated knee flexion active assisted range of motion with left pushing the right into flexion 2 sets of 10 for 10 seconds TKE between reps of seated knee flexion 2 sets of 10 for 3 seconds  Functional Activities: Double leg Press 15 reps full range with stretch into flexion and extension 87#, slow  eccentrics Single leg Press 10 reps full range with stretch into flexion and extension 43#  Neuromuscular re-education: Tandem balance with eyes open; head turning; eyes closed 4 x each 20 sceonds  Vaso right knee 10 minutes Medium pressure 34 degrees   01/29/24 TherAct NuStep seat 5 x 8 min; L5 Seated LAQ 3# on Rt 3x10 with 3 sec hold AA Rt knee flexion 10x10 sec hold with LLE providing overpressure Supine SAQ 3# 3x10; 5 sec hold for ROM Quad sets with heel prop - moved to long sitting using visual feedback for improved quad activation; also discussed heel prop stretch for home AA heel slides 2x10; with ball and strap (beach ball provided for home)  Modalities Vaso x 10 min; mod pressure, 34 deg to Rt knee   01/24/2024 Recumbent bike Seat 5 for 5 minutes, full range from the start Supine quadriceps sets with a heel prop under the right heel 2 sets of 10 for 5 seconds Seated knee flexion active assisted range of motion with left pushing the right into flexion 2 sets of 10 for 10 seconds TKE between reps of seated knee flexion 10 for 3 seconds  Functional Activities: Double leg Press 15 reps full range with stretch into flexion and extension 75#, slow eccentrics Single leg Press 10 reps full range with stretch into flexion and extension 37#  Vaso right knee 10 minutes High pressure 34 degrees   PATIENT EDUCATION:  Education details: See above Person educated: Patient Education method: Explanation, Demonstration, Tactile cues, Verbal cues, and Handouts Education comprehension: verbalized understanding, returned demonstration, verbal cues required, tactile cues required, and needs further education   HOME EXERCISE PROGRAM: Access Code: [email or web address] URL: [email or web address] Date: 01/09/2024 Prepared by: Lamar Ivory   Exercises - Supine Quadricep Sets  - 5 x daily - 7 x weekly - 2 sets - 10 reps - 5 second hold - Seated Knee Flexion AAROM  - 5 x daily - 7 x  weekly - 2 sets - 5 reps - 10 seconds hold - Seated Long Arc Quad  - 5 x daily - 7 x weekly - 2 sets - 5 reps   ASSESSMENT:   CLINICAL IMPRESSION:  Her BP was elevated slight but again in range to allow exercises & activities that are not too intense. Challenged by balance activities with increased sway on Airex but able to use handrail to correct.   Evaluation on 01/09/2024:  Patient is a 59 y.o. female who was seen today for physical therapy evaluation and treatment for  Z96.651 (ICD-10-CM) - Status post total right knee replacement  .  Laneah did a great job in the office today and had improved active range of motion when she left as compared to objective visits listed above.  I strongly encouraged her to get her exercises at least 5 times a day and to push  herself as she is going to have pain whether or not she completes her home exercises and she might as well get the benefit associated with home exercise program participation.  Jackelin understood this and based on her efforts today is a good rehabilitation candidate.   OBJECTIVE IMPAIRMENTS: Abnormal gait, decreased activity tolerance, decreased endurance, decreased knowledge of condition, difficulty walking, decreased ROM, decreased strength, increased edema, impaired perceived functional ability, obesity, and pain.    ACTIVITY LIMITATIONS: bending, standing, squatting, sleeping, stairs, dressing, and locomotion level   PARTICIPATION LIMITATIONS: cleaning, interpersonal relationship, driving, community activity, and occupation   PERSONAL FACTORS: Type 2 diabetes, HLD, HTN, asthma are also affecting patient's functional outcome.    REHAB POTENTIAL: Good   CLINICAL DECISION MAKING: Stable/uncomplicated   EVALUATION COMPLEXITY: Low     GOALS: Goals reviewed with patient? Yes   SHORT TERM GOALS: Target date: 02/20/2024 Maneh will be independent with her day 1 home exercise program Baseline: Started 01/09/2024 Goal status: Met  01/10/2024   2.  Improve right knee active range of motion to 0 - 5 - 95 degrees Baseline: 0 - 14 - 70 degrees Goal status: Met 01/21/2024   3.  Mieshia will be able to extend her knee against gravity with no quadriceps lag Baseline: Unable at evaluation Goal status: Met 01/21/2024     LONG TERM GOALS: Target date: 04/02/2024   Improve patient-specific functional score to at least 6 Baseline: 2.75 Goal status: Met 01/31/2024   2.  Amorina will report right knee pain consistently 3/10 or better on the visual analog scale Baseline: 5-7/10 Goal status: Ongoing 02/04/2024   3.  Improve right knee active range of motion to 0 - 3 - 110 degrees or better Baseline: 0 - 14 - 70 degrees Goal status: Ongoing 02/04/2024   4.  Improve right quadriceps strength as assessed by MMT and functional scores Baseline: Unable to fully extend the knee against gravity and evaluation Goal status: Ongoing 02/04/2024   5.  Breklyn will be able to walk without an assistive device Baseline: Wheeled walker Goal status: Ongoing 02/04/2024   6.  Zoila will be independent with her long-term maintenance home exercise program at discharge Baseline: Started 01/09/2024 Goal status:   Ongoing 02/04/2024     PLAN:   PT FREQUENCY: 2-3 times a week   PT DURATION: 9 weeks   PLANNED INTERVENTIONS: 97110-Therapeutic exercises, 97530- Therapeutic activity, 97112- Neuromuscular re-education, 97535- Self Care, [id number]- Manual therapy, [phone]- Gait training, [phone]- Electrical stimulation (unattended), [id number]- Vasopneumatic device, Patient/Family education, Balance training, Stair training, Joint mobilization, and Cryotherapy   PLAN FOR NEXT SESSION:   Monitor BP,  check ROM, Post total knee rehabilitation with heavy emphasis remaining on active range of motion, quadriceps strength and edema control.     Burnard CHRISTELLA Meth, PT, 02/07/2024, 9:28 AM

## 2024-02-07 ENCOUNTER — Ambulatory Visit (INDEPENDENT_AMBULATORY_CARE_PROVIDER_SITE_OTHER)

## 2024-02-07 DIAGNOSIS — M25561 Pain in right knee: Secondary | ICD-10-CM | POA: Diagnosis not present

## 2024-02-07 DIAGNOSIS — M25661 Stiffness of right knee, not elsewhere classified: Secondary | ICD-10-CM

## 2024-02-07 DIAGNOSIS — M6281 Muscle weakness (generalized): Secondary | ICD-10-CM

## 2024-02-07 DIAGNOSIS — R600 Localized edema: Secondary | ICD-10-CM | POA: Diagnosis not present

## 2024-02-07 DIAGNOSIS — R262 Difficulty in walking, not elsewhere classified: Secondary | ICD-10-CM

## 2024-02-11 ENCOUNTER — Ambulatory Visit: Payer: Self-pay

## 2024-02-11 ENCOUNTER — Encounter: Admitting: Physical Therapy

## 2024-02-11 VITALS — BP 163/81 | HR 84 | Temp 99.2°F | Ht 60.0 in | Wt 186.2 lb

## 2024-02-11 DIAGNOSIS — R519 Headache, unspecified: Secondary | ICD-10-CM | POA: Diagnosis not present

## 2024-02-11 DIAGNOSIS — I10 Essential (primary) hypertension: Secondary | ICD-10-CM

## 2024-02-11 MED ORDER — HYDRALAZINE HCL 50 MG PO TABS: 50.0000 mg | ORAL_TABLET | Freq: Three times a day (TID) | ORAL | 3 refills | Status: AC

## 2024-02-11 NOTE — Progress Notes (Signed)
 "  Established Patient Office Visit  Subjective   Patient ID: Sandra [name], female    DOB: 08/07/1965  Age: 59 y.o. MRN: [id number]  Hypertension   Patient here for 2-week follow-up regarding refractory hypertension and headaches. Past Medical History:  Diagnosis Date   Acute conjunctivitis, right eye 01/31/2022   Acute pain of left shoulder 06/07/2017   Adenomyosis 06/03/2012   Arthritis    Asthma    as a child   Asthma 11/07/2005   Reported Diagnosis in 2012, no formal spirometry    Cardiomyopathy (HCC)    likely nonischemic DCM per Dr. 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   Perimenopausal menorrhagia 02/20/2012   Perimenopausal menorrhagia 02/20/2012   Psoriasis 04/29/2013   Right foot strain 05/15/2021   Right low back pain 03/02/2014   Routine adult health maintenance 09/27/2010   Swelling of abdominal wall 03/02/2014   Swelling of abdominal wall 03/02/2014   Symptoms of upper respiratory infection (URI) 04/01/2018   Tinnitus 06/01/2020   Viral illness 10/08/2013   Viral URI with cough 01/13/2013        Objective:     BP (!) 163/81 (BP Location: Left Arm, Patient Position: Sitting, Cuff Size: Normal)   Pulse 84   Temp 99.2 F (37.3 C) (Oral)   Ht 5' (1.524 m)   Wt 186 lb 3.2 oz (84.5 kg)   SpO2 99%   BMI 36.36 kg/m  BP Readings from Last 3 Encounters:  02/11/24 (!) 163/81  01/28/24 (!) 159/76  01/14/24 (!) 143/65   Wt Readings from Last 3 Encounters:  02/11/24 186 lb 3.2 oz (84.5 kg)  01/28/24 188 lb 6.4 oz (85.5 kg)  12/23/23 192 lb (87.1 kg)      Physical Exam Vitals reviewed.  Constitutional:      Appearance: Normal appearance.  Eyes:     Conjunctiva/sclera: Conjunctivae normal.  Pulmonary:     Effort: Pulmonary effort is normal.  Musculoskeletal:     Comments: No tenderness to palpation along the cervical or occipital musculature No tenderness to palpation over the trapezius Cervical sidebending and rotation and chin to chest normal  Skin:    General: Skin  is warm.  Neurological:     General: No focal deficit present.     Mental Status: She is alert and oriented to person, place, and time.     Comments: Walking with cane s/p total knee replacement  Psychiatric:        Mood and Affect: Mood normal.        Behavior: Behavior normal.      No results found for any visits on 02/11/24.  Last hemoglobin A1c Lab Results  Component Value Date   HGBA1C 6.2 01/28/2024      The ASCVD Risk score (Arnett DK, et  al., 2019) failed to calculate for the following reasons:   The valid total cholesterol range is 130 to 320 mg/dL    Assessment & Plan:   Assessment & Plan Essential hypertension She has been having recurrent headaches since around Christmas time, a severe episode prompted her to go to the ED and she was found to have very elevated blood pressure.  Has a history of chronic HFrEF and current regimen includes hydralazine  25 mg 3 times daily, metoprolol  100 mg daily, spironolactone  25 mg daily and sacubitril -valsartan  97-103 mg twice daily.  At last office visit 2 weeks ago she was recommended to check her blood pressure 2 times daily and reevaluate at today's visit.  Her at home recordings are elevated with systolics ranging mostly from 140s to 170s.  She has had less headaches but has still had 2 headaches since LOV.  Will increase her hydralazine  to 50 mg 3 times daily today and start working up for secondary causes of hypertension.  Stop bang score is 5, will refer for a sleep study.  Will also order a renal artery ultrasound.  Unable to assess renin aldosterone levels right now because she is currently on Entresto  and spironolactone .  Will have her follow back up in 2 weeks and see how her pressures have responded to the increase in hydralazine .  If it is clinically safe at that time, can withdraw spironolactone  for at least 4 weeks and Entresto  for at least 2 weeks before checking renin and aldosterone levels.  If withdrawal is not feasible, can consider obtaining renin and aldosterone levels with careful interpretation.  Suppressed renin with an aldosterone concentration greater than or equal to 10-15 while on these medications is suggestive of primary aldosteronism.  Plan Increase hydralazine  from 25 mg to 50 mg 3 times daily Continue to monitor blood pressures twice daily and follow-up in 2 weeks Start secondary hypertension workup  Orders:   Ambulatory referral to Sleep Studies   US  Renal  Artery Stenosis; Future  Nonintractable headache, unspecified chronicity pattern, unspecified headache type Exam not concerning for red flag symptoms today and she is currently not having a headache.  She has had 2 in the last 2 weeks and they are relieved by her heating pad or taking a warm bath.  She is not sure how long they last because typically she will fall asleep and when she wakes up they are gone.  She has been taking Tylenol  although it only helps some.  She has chronic spots in her vision because of diabetic eye changes but denies other migraine aura symptoms.  She has a history of migraines and she says that these feel different.  Given the location of her headaches, left frontal and occipital with radiation to her neck, I wonder if these are cervicogenic.  She also has the most relief with her heating pad and a hot bath  which further suggests cervicogenic.  She has been under a lot of stress with her health care and recent surgeries, it is possible that she is carrying tension in her upper back and neck muscles.  Although she has had persistently elevated blood pressures and this could be a blood pressure induced headache.  I recommended she use a heating pad twice a day even if she is not having a headache, followed by cervical stretching exercises.  I put the exercises in her AVS.  If her headaches persist, she may need a referral to neurology.  It is also possible that these are migraines and they are just different from migraines that she has had in the past.  SHe was previously managed with Eletriptan  20 mg as needed for migraine, can consider resuming this. Plan Continue Tylenol  as needed for headache pain Heating pad followed by cervical stretching exercises twice daily Follow-up in 2 weeks Consider neurology referral and/or migraine therapy     Return in about 2 weeks (around 02/25/2024) for HTN HA follow up with Dr Charmayne at 1:15 if possible .    Viktoria Charmayne, DO "

## 2024-02-11 NOTE — Therapy (Incomplete)
 OUTPATIENT PHYSICAL THERAPY LOWER EXTREMITY TREATMENT    Patient Name: Sandra [name] MRN: [id number] DOB:1965/08/18, 59 y.o., female Today's Date: 01/09/2024   END OF SESSION:         Past Medical History:  Diagnosis Date   Acute conjunctivitis, right eye 01/31/2022   Acute pain of left shoulder 06/07/2017   Adenomyosis 06/03/2012   Arthritis     Asthma      as a child   Asthma 11/07/2005    Reported Diagnosis in 2012, no formal spirometry    Cardiomyopathy (HCC)      likely nonischemic DCM per Dr. 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   Perimenopausal menorrhagia 02/20/2012   Perimenopausal menorrhagia 02/20/2012   Psoriasis 04/29/2013   Right foot strain 05/15/2021   Right low back pain 03/02/2014   Routine adult health maintenance 09/27/2010   Swelling of abdominal wall 03/02/2014   Swelling of abdominal wall 03/02/2014   Symptoms of upper respiratory infection (URI) 04/01/2018   Tinnitus 06/01/2020   Viral illness 10/08/2013   Viral URI with cough 01/13/2013             Past Surgical History:  Procedure Laterality Date   CHOLECYSTECTOMY   2009   DILATATION & CURETTAGE/HYSTEROSCOPY WITH MYOSURE N/A 12/25/2017    Procedure: DILATATION & CURETTAGE/HYSTEROSCOPY   POLYPECTOMY WITH MYOSURE;  Surgeon: Linnell Devere BRAVO, MD;  Location: [facility];  Service: Gynecology;  Laterality: N/A;   TOTAL KNEE ARTHROPLASTY Right 12/23/2023    Procedure: RIGHT TOTAL KNEE ARTHROPLASTY;  Surgeon: [name], MD;  Location: [facility];  Service: Orthopedics;  Laterality: Right;   TUBAL LIGATION                Patient Active Problem List    Diagnosis Date Noted   Status post total right knee replacement 12/23/2023   Primary osteoarthritis of right knee 12/20/2023   Exertional dyspnea 03/27/2023   Macrocytic anemia 08/08/2022   Trigger index finger of left hand 08/08/2022   Pigmented skin lesion of uncertain behavior of head 02/01/2022   Post-viral cough syndrome  10/29/2019   GERD (gastroesophageal reflux disease) 10/15/2019   Generalized anxiety disorder 09/21/2019   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   Influenza 02/23/2016   Psoriasis 04/29/2013   Morbid obesity (HCC) 05/16/2012   Chronic HFrEF (heart failure with reduced ejection fraction) (HCC) 02/22/2012   LBBB (left bundle branch block) 01/24/2012   Routine adult health maintenance 09/27/2010   Type 2 diabetes mellitus with other specified complication (HCC) 11/17/2009    Hyperlipidemia 08/20/2007   Essential hypertension 07/08/2006   Asthma 11/07/2005      PCP: Armando Rossetti, MD   REFERRING PROVIDER: Ronal LITTIE Grave, PA-C   REFERRING DIAG:  [phone] (ICD-10-CM) - Status post total right knee replacement      THERAPY DIAG:  No diagnosis found.    Rationale for Evaluation and Treatment: Rehabilitation   ONSET DATE: 12/23/2023   SUBJECTIVE:    SUBJECTIVE STATEMENT: *** Pt reports feeling overall better than last visit.   PERTINENT HISTORY: Type 2 diabetes, HLD, HTN, asthma    PAIN:  Are you having pain? Yes: NPRS scale: today 0/10 but took meds Pain location: Rt knee  Pain description: Ache, stiff, sore, occasional sharp Aggravating factors: Prolonged postures, better with sleeping sleeping and end range motion Relieving factors: Tylenol , ice, muscle relaxer   PRECAUTIONS: None   RED FLAGS: None      WEIGHT BEARING RESTRICTIONS: No   FALLS:  Has patient fallen in last 6 months? No   LIVING ENVIRONMENT: Lives with: lives with their family and lives with their spouse Lives in: House/apartment Stairs: OK with a rail, but needs work Has following equipment at home: Environmental Consultant - 2 wheeled   OCCUPATION: Designer, jewellery   PLOF: Independent   PATIENT GOALS: Normal motion, walk without an assistive device, return to work without restrictions, sleep normally   NEXT MD VISIT: 02/04/2023   OBJECTIVE:  Note: Objective measures were completed at Evaluation unless otherwise noted.   DIAGNOSTIC FINDINGS: Right knee arthroplasty without immediate postoperative complication.   PATIENT SURVEYS:  PSFS: THE PATIENT SPECIFIC FUNCTIONAL SCALE   Place score of 0-10 (0 = unable to perform activity and 10 = able to perform activity at the same level as before injury or problem)   Activity Date:  01/09/2024 01/31/2024    Bend the right knee 2  5    2.  Walk 2  5    3.  Stand 4  7    4.  Sleep 3  7    Total Score 2.75  6        Total  Score = Sum of activity scores/number of activities   Minimally Detectable Change: 3 points (for single activity); 2 points (for average score)   [name] (nd). The Patient Specific Functional Scale . Retrieved from [email or web address]    COGNITION: Overall cognitive status: Within functional limits for tasks assessed                         SENSATION: Sandra [name] had no complaints of peripheral pain or paresthesias   EDEMA:  Noted and not objectively assessed   LOWER EXTREMITY ROM:   Active ROM Left/Right 01/09/24  Right  01/10/24 Right 01/21/2024 Right 01/24/2024 Right 01/29/24 Right 01/31/2024  Knee flexion 134/70  84 96 100 AA: 98 P: 102 103  Knee extension 0/-14  -7 -4 -4 P: -6 (supine) -3   (Blank rows = not tested)   LOWER EXTREMITY STRENGTH:   MMT  Left/Right 01/09/24 Left/Right 01/31/2024   Hip flexion      Hip extension      Hip abduction      Hip adduction      Hip internal rotation      Hip external rotation      Knee flexion      Knee extension 4+/2+  4+/2+  Ankle dorsiflexion      Ankle plantarflexion      Ankle inversion      Ankle eversion       (Blank rows = not tested)   GAIT: Distance walked: 100 feet Assistive device utilized: Environmental Consultant - 2 wheeled Level of assistance: Complete Independence Comments: Sandra [name] would like to be assistive device free                                                                                                                              TREATMENT DATE:  Rt TKA 02/11/24 ***   02/07/24     R TKA Seated BP prior to exercise 162/98 HR 90 LAQ 2# 3x10 Marching with 2# weight Hamstring stretch in sitting with foot on  step 3x25 sec Sci fit 8 min LE only Neuro Re ed Tandem 25sec 4x Tandem walking onAir ex 6 laps UUDD on Air ex   Vaso right knee 10 minutes Medium pressure 34 degrees   02/04/2024 Seated prior to exercise BP 170/93 HR 101  Therapeutic  Exercise: SciFit bike Seat 9 for 8 minutes BLEs only Gastroc stretch step heel depression 30 sec 3 reps Hamstring stretch foot on step trunk flexion 30 sec 3 reps Quad stretch standing knee flexion with towel 30 sec 3 reps  Therapeutic Activities: Weight shift to RLE 3 sec hold 5 reps prior to ambulating.  Pt had decrease in antalgic gait with this activity.  PT demo & verbal cues on step up & step down technique. 6 step with BUE support with carryover of technique with verbal cues 10 reps.   Neuromuscular re-education: Tandem stance 30 sec with RLE in front 1st rep & in back 2nd rep; 1st set floor eyes open, 2nd set floor eyes closed, 3rd set foam eyes open.   Self-care: PT demo & verbal cues on ice massage. Pt verbalized and return demo understanding.    Manual Therapy: Soft tissue mobilization with suction cup and instrument assisted scar & joint line mobilizations.   Vaso right knee 10 minutes Medium pressure 34 degrees   01/31/2024 Recumbent bike Seat 5 for 5 minutes, full range 6 to 7 MPH Supine quadriceps sets with a heel prop under the right heel 2 sets of 10 for 5 seconds Seated knee flexion active assisted range of motion with left pushing the right into flexion 2 sets of 10 for 10 seconds TKE between reps of seated knee flexion 2 sets of 10 for 3 seconds  Functional Activities: Double leg Press 15 reps full range with stretch into flexion and extension 87#,  slow eccentrics Single leg Press 10 reps full range with stretch into flexion and extension 43#  Neuromuscular re-education: Tandem balance with eyes open; head turning; eyes closed 4 x each 20 sceonds  Vaso right knee 10 minutes Medium pressure 34 degrees   01/29/24 TherAct NuStep seat 5 x 8 min; L5 Seated LAQ 3# on Rt 3x10 with 3 sec hold AA Rt knee flexion 10x10 sec hold with LLE providing overpressure Supine SAQ 3# 3x10; 5 sec hold for ROM Quad sets with heel prop - moved to long sitting using visual  feedback for improved quad activation; also discussed heel prop stretch for home AA heel slides 2x10; with ball and strap (beach ball provided for home)  Modalities Vaso x 10 min; mod pressure, 34 deg to Rt knee   01/24/2024 Recumbent bike Seat 5 for 5 minutes, full range from the start Supine quadriceps sets with a heel prop under the right heel 2 sets of 10 for 5 seconds Seated knee flexion active assisted range of motion with left pushing the right into flexion 2 sets of 10 for 10 seconds TKE between reps of seated knee flexion 10 for 3 seconds  Functional Activities: Double leg Press 15 reps full range with stretch into flexion and extension 75#, slow eccentrics Single leg Press 10 reps full range with stretch into flexion and extension 37#  Vaso right knee 10 minutes High pressure 34 degrees   PATIENT EDUCATION:  Education details: See above Person educated: Patient Education method: Explanation, Demonstration, Tactile cues, Verbal cues, and Handouts Education comprehension: verbalized understanding, returned demonstration, verbal cues required, tactile cues required, and needs further education   HOME EXERCISE PROGRAM: Access Code: [email or web address] URL: [email or web address] Date: 01/09/2024 Prepared by: Lamar Ivory   Exercises - Supine Quadricep Sets  - 5 x daily - 7 x weekly - 2 sets - 10 reps - 5 second hold - Seated Knee Flexion AAROM  - 5 x daily - 7 x weekly - 2 sets - 5 reps - 10 seconds hold - Seated Long Arc Quad  - 5 x daily - 7 x weekly - 2 sets - 5 reps   ASSESSMENT:   CLINICAL IMPRESSION: ***  Her BP was elevated slight but again in range to allow exercises & activities that are not too intense. Challenged by balance activities with increased sway on Airex but able to use handrail to correct.   Evaluation on 01/09/2024:  Patient is a 59 y.o. female who was seen today for physical therapy evaluation and treatment for  Z96.651 (ICD-10-CM) - Status  post total right knee replacement  .  Sandra [name] did a great job in the office today and had improved active range of motion when she left as compared to objective visits listed above.  I strongly encouraged her to get her exercises at least 5 times a day and to push herself as she is going to have pain whether or not she completes her home exercises and she might as well get the benefit associated with home exercise program participation.  Sandra [name] understood this and based on her efforts today is a good rehabilitation candidate.   OBJECTIVE IMPAIRMENTS: Abnormal gait, decreased activity tolerance, decreased endurance, decreased knowledge of condition, difficulty walking, decreased ROM, decreased strength, increased edema, impaired perceived functional ability, obesity, and pain.    ACTIVITY LIMITATIONS: bending, standing, squatting, sleeping, stairs, dressing, and locomotion level   PARTICIPATION LIMITATIONS: cleaning, interpersonal relationship, driving, community activity, and occupation   PERSONAL FACTORS: Type 2  diabetes, HLD, HTN, asthma are also affecting patient's functional outcome.    REHAB POTENTIAL: Good   CLINICAL DECISION MAKING: Stable/uncomplicated   EVALUATION COMPLEXITY: Low     GOALS: Goals reviewed with patient? Yes   SHORT TERM GOALS: Target date: 02/20/2024 Sandra [name] will be independent with her day 1 home exercise program Baseline: Started 01/09/2024 Goal status: Met 01/10/2024   2.  Improve right knee active range of motion to 0 - 5 - 95 degrees Baseline: 0 - 14 - 70 degrees Goal status: Met 01/21/2024   3.  Sandra [name] will be able to extend her knee against gravity with no quadriceps lag Baseline: Unable at evaluation Goal status: Met 01/21/2024     LONG TERM GOALS: Target date: 04/02/2024   Improve patient-specific functional score to at least 6 Baseline: 2.75 Goal status: Met 01/31/2024   2.  Sandra [name] will report right knee pain consistently 3/10 or better on  the visual analog scale Baseline: 5-7/10 Goal status: Ongoing 02/04/2024   3.  Improve right knee active range of motion to 0 - 3 - 110 degrees or better Baseline: 0 - 14 - 70 degrees Goal status: Ongoing 02/04/2024   4.  Improve right quadriceps strength as assessed by MMT and functional scores Baseline: Unable to fully extend the knee against gravity and evaluation Goal status: Ongoing 02/04/2024   5.  Sandra [name] will be able to walk without an assistive device Baseline: Wheeled walker Goal status: Ongoing 02/04/2024   6.  Sandra [name] will be independent with her long-term maintenance home exercise program at discharge Baseline: Started 01/09/2024 Goal status:   Ongoing 02/04/2024     PLAN:   PT FREQUENCY: 2-3 times a week   PT DURATION: 9 weeks   PLANNED INTERVENTIONS: 97110-Therapeutic exercises, 97530- Therapeutic activity, 97112- Neuromuscular re-education, 97535- Self Care, 02859- Manual therapy, [phone]- Gait training, [phone]- Electrical stimulation (unattended), 97016- Vasopneumatic device, Patient/Family education, Balance training, Stair training, Joint mobilization, and Cryotherapy   PLAN FOR NEXT SESSION:   *** Monitor BP,  check ROM, Post total knee rehabilitation with heavy emphasis remaining on active range of motion, quadriceps strength and edema control.     Corean JULIANNA Ku, PT, DPT 02/11/24 7:15 AM

## 2024-02-11 NOTE — Assessment & Plan Note (Signed)
 She has been having recurrent headaches since around Christmas time, a severe episode prompted her to go to the ED and she was found to have very elevated blood pressure.  Has a history of chronic HFrEF and current regimen includes hydralazine  25 mg 3 times daily, metoprolol  100 mg daily, spironolactone  25 mg daily and sacubitril -valsartan  97-103 mg twice daily.  At last office visit 2 weeks ago she was recommended to check her blood pressure 2 times daily and reevaluate at today's visit.  Her at home recordings are elevated with systolics ranging mostly from 140s to 170s.  She has had less headaches but has still had 2 headaches since LOV.  Will increase her hydralazine  to 50 mg 3 times daily today and start working up for secondary causes of hypertension.  Stop bang score is 5, will refer for a sleep study.  Will also order a renal artery ultrasound.  Unable to assess renin aldosterone levels right now because she is currently on Entresto  and spironolactone .  Will have her follow back up in 2 weeks and see how her pressures have responded to the increase in hydralazine .  If it is clinically safe at that time, can withdraw spironolactone  for at least 4 weeks and Entresto  for at least 2 weeks before checking renin and aldosterone levels.  If withdrawal is not feasible, can consider obtaining renin and aldosterone levels with careful interpretation.  Suppressed renin with an aldosterone concentration greater than or equal to 10-15 while on these medications is suggestive of primary aldosteronism.  Plan Increase hydralazine  from 25 mg to 50 mg 3 times daily Continue to monitor blood pressures twice daily and follow-up in 2 weeks Start secondary hypertension workup  Orders:   Ambulatory referral to Sleep Studies   US  Renal Artery Stenosis; Future

## 2024-02-11 NOTE — Patient Instructions (Signed)
 It was wonderful seeing you today!   1) For your blood pressure, continue to check it twice a day and we will see you back in two weeks.   Increase hydralazine  from 25 mg three times a day to 50 mg three times a day  I placed a referral for a sleep study be on the lookout for a call from them  I also ordered an ultrasound of your abdomen, be on the lookout for a call to schedule  2) For your headaches, I attached some stretches for you to try. I recommend using your heating pad a couple of times a day even when you're not having a headache and then doing the stretches after your warm.   Continue tylenol , don't exceed 3000 mg a day  If you have any questions please feel free to the call the clinic at anytime at [phone].  Have a blessed day,  Dr. Charmayne

## 2024-02-11 NOTE — Assessment & Plan Note (Signed)
 Exam not concerning for red flag symptoms today and she is currently not having a headache.  She has had 2 in the last 2 weeks and they are relieved by her heating pad or taking a warm bath.  She is not sure how long they last because typically she will fall asleep and when she wakes up they are gone.  She has been taking Tylenol  although it only helps some.  She has chronic spots in her vision because of diabetic eye changes but denies other migraine aura symptoms.  She has a history of migraines and she says that these feel different.  Given the location of her headaches, left frontal and occipital with radiation to her neck, I wonder if these are cervicogenic.  She also has the most relief with her heating pad and a hot bath which further suggests cervicogenic.  She has been under a lot of stress with her health care and recent surgeries, it is possible that she is carrying tension in her upper back and neck muscles.  Although she has had persistently elevated blood pressures and this could be a blood pressure induced headache.  I recommended she use a heating pad twice a day even if she is not having a headache, followed by cervical stretching exercises.  I put the exercises in her AVS.  If her headaches persist, she may need a referral to neurology.  It is also possible that these are migraines and they are just different from migraines that she has had in the past.  SHe was previously managed with Eletriptan  20 mg as needed for migraine, can consider resuming this. Plan Continue Tylenol  as needed for headache pain Heating pad followed by cervical stretching exercises twice daily Follow-up in 2 weeks Consider neurology referral and/or migraine therapy

## 2024-02-12 ENCOUNTER — Ambulatory Visit (INDEPENDENT_AMBULATORY_CARE_PROVIDER_SITE_OTHER): Admitting: Physical Therapy

## 2024-02-12 ENCOUNTER — Other Ambulatory Visit: Payer: Self-pay

## 2024-02-12 DIAGNOSIS — M6281 Muscle weakness (generalized): Secondary | ICD-10-CM

## 2024-02-12 DIAGNOSIS — M25561 Pain in right knee: Secondary | ICD-10-CM

## 2024-02-12 DIAGNOSIS — E669 Obesity, unspecified: Secondary | ICD-10-CM

## 2024-02-12 DIAGNOSIS — R262 Difficulty in walking, not elsewhere classified: Secondary | ICD-10-CM | POA: Diagnosis not present

## 2024-02-12 DIAGNOSIS — R600 Localized edema: Secondary | ICD-10-CM

## 2024-02-12 DIAGNOSIS — M25661 Stiffness of right knee, not elsewhere classified: Secondary | ICD-10-CM | POA: Diagnosis not present

## 2024-02-12 MED ORDER — BASAGLAR KWIKPEN 100 UNIT/ML ~~LOC~~ SOPN: 26.0000 [IU] | PEN_INJECTOR | Freq: Every day | SUBCUTANEOUS | 3 refills | Status: AC

## 2024-02-12 NOTE — Addendum Note (Signed)
 Addended by: Cartina Brousseau L on: 02/12/2024 09:56 AM   Modules accepted: Level of Service

## 2024-02-12 NOTE — Progress Notes (Signed)
 Internal Medicine Clinic Attending  I was physically present during the key portions of the resident provided service and participated in the medical decision making of patient's management care. I reviewed pertinent patient test results.  The assessment, diagnosis, and plan were formulated together and I agree with the documentation in the resident's note.  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 For management, I agree with increasing hydralazine  as next step.   For evaluation, I agree with sleep study & renal artery U/S.   Once blood pressure is controlled, could consider pausing Spiro x 4 weeks, then checking renin/aldo levels

## 2024-02-12 NOTE — Telephone Encounter (Signed)
 Medication sent to pharmacy

## 2024-02-12 NOTE — Therapy (Signed)
 OUTPATIENT PHYSICAL THERAPY LOWER EXTREMITY TREATMENT    Patient Name: Sandra [name] MRN: [id number] DOB:[birth year], 59 y.o., female Today's Date: 01/09/2024   END OF SESSION:  PT End of Session - 02/12/24 1018     Visit Number 9    Number of Visits 24    Date for Recertification  04/02/24    Authorization Type [employer] [facility]; 20 visit limit, $35 copay    PT Start Time 1018    PT Stop Time 1056    PT Time Calculation (min) 38 min    Activity Tolerance Patient tolerated treatment well    Behavior During Therapy WFL for tasks assessed/performed                Past Medical History:  Diagnosis Date   Acute conjunctivitis, right eye 01/31/2022   Acute pain of left shoulder 06/07/2017   Adenomyosis 06/03/2012   Arthritis     Asthma      as a child   Asthma 11/07/2005    Reported Diagnosis in 2012, no formal spirometry    Cardiomyopathy (HCC)      likely nonischemic DCM per Dr. 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   Perimenopausal menorrhagia 02/20/2012   Perimenopausal menorrhagia 02/20/2012   Psoriasis 04/29/2013   Right foot strain 05/15/2021   Right low back pain 03/02/2014   Routine adult health maintenance 09/27/2010   Swelling of abdominal wall 03/02/2014   Swelling of abdominal wall 03/02/2014   Symptoms of upper respiratory infection (URI) 04/01/2018   Tinnitus 06/01/2020   Viral illness 10/08/2013   Viral URI with cough 01/13/2013             Past Surgical History:  Procedure Laterality Date   CHOLECYSTECTOMY   2009   DILATATION & CURETTAGE/HYSTEROSCOPY WITH MYOSURE N/A 12/25/2017    Procedure: DILATATION & CURETTAGE/HYSTEROSCOPY   POLYPECTOMY WITH MYOSURE;  Surgeon: Linnell Devere BRAVO, MD;  Location: [facility];  Service: Gynecology;  Laterality: N/A;   TOTAL KNEE ARTHROPLASTY Right 12/23/2023    Procedure: RIGHT TOTAL KNEE ARTHROPLASTY;  Surgeon: Jerri Moody  M, MD;  Location: [facility];  Service: Orthopedics;  Laterality: Right;   TUBAL LIGATION                Patient Active Problem List    Diagnosis Date Noted   Status post total right knee replacement 12/23/2023   Primary osteoarthritis of right knee 12/20/2023   Exertional dyspnea 03/27/2023   Macrocytic anemia 08/08/2022   Trigger index finger of left hand 08/08/2022   Pigmented skin lesion of uncertain behavior of head 02/01/2022   Post-viral cough syndrome 10/29/2019   GERD (gastroesophageal reflux disease) 10/15/2019   Generalized anxiety disorder 09/21/2019    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   Influenza 02/23/2016   Psoriasis 04/29/2013   Morbid obesity (HCC) 05/16/2012   Chronic HFrEF (heart failure with reduced ejection fraction) (HCC) 02/22/2012   LBBB (left bundle branch block) 01/24/2012   Routine adult health maintenance 09/27/2010   Type 2 diabetes mellitus with other specified complication (HCC) 11/17/2009   Hyperlipidemia 08/20/2007   Essential hypertension 07/08/2006   Asthma 11/07/2005      PCP: Armando Rossetti, MD   REFERRING PROVIDER: Ronal LITTIE Grave, PA-C   REFERRING DIAG:  [id number] (ICD-10-CM) - Status post total right knee replacement      THERAPY DIAG:  No diagnosis found.    Rationale for Evaluation and Treatment: Rehabilitation   ONSET DATE: 12/23/2023   SUBJECTIVE:    SUBJECTIVE STATEMENT: Pt reports feeling overall better than last visit.   PERTINENT HISTORY: Type 2 diabetes, HLD, HTN, asthma    PAIN:  Are you having pain? Yes: NPRS scale: today 0/10 but took meds Pain location: Rt knee  Pain description: Ache, stiff, sore, occasional sharp Aggravating factors: Prolonged postures, better with sleeping sleeping and end range motion Relieving factors: Tylenol , ice, muscle relaxer   PRECAUTIONS: None   RED FLAGS: None      WEIGHT BEARING RESTRICTIONS: No   FALLS:  Has patient fallen in last 6 months? No   LIVING ENVIRONMENT: Lives with: lives with their family and lives with their spouse Lives in: House/apartment Stairs: OK with a rail, but needs work Has following equipment at home: Environmental Consultant - 2 wheeled   OCCUPATION: Designer, jewellery   PLOF: Independent   PATIENT GOALS: Normal motion, walk without an assistive device, return to work without restrictions, sleep normally   NEXT MD VISIT: 02/04/2023   OBJECTIVE:  Note: Objective measures were completed at Evaluation unless otherwise noted.   DIAGNOSTIC FINDINGS: Right knee arthroplasty without immediate  postoperative complication.   PATIENT SURVEYS:  PSFS: THE PATIENT SPECIFIC FUNCTIONAL SCALE   Place score of 0-10 (0 = unable to perform activity and 10 = able to perform activity at the same level as before injury or problem)   Activity Date:  01/09/2024 01/31/2024    Bend the right knee 2  5    2.  Walk 2  5    3.  Stand 4  7    4.  Sleep 3  7    Total Score 2.75  6        Total Score = Sum of activity scores/number of activities   Minimally Detectable Change: 3 points (for single activity); 2 points (for average score)   [facility] (nd). The Patient Specific Functional Scale . Retrieved from [email or web address]    COGNITION: Overall cognitive status: Within functional limits for tasks assessed  SENSATION: Valta had no complaints of peripheral pain or paresthesias   EDEMA:  Noted and not objectively assessed   LOWER EXTREMITY ROM:   Active ROM Left/Right 01/09/24  Right  01/10/24 Right 01/21/2024 Right 01/24/2024 Right 01/29/24 Right 01/31/2024  Knee flexion 134/70  84 96 100 AA: 98 P: 102 103  Knee extension 0/-14  -7 -4 -4 P: -6 (supine) -3   (Blank rows = not tested)   LOWER EXTREMITY STRENGTH:   MMT Left/Right 01/09/24 Left/Right 01/31/2024   Hip flexion      Hip extension      Hip abduction      Hip adduction      Hip internal rotation      Hip external rotation      Knee flexion      Knee extension 4+/2+  4+/2+  Ankle dorsiflexion      Ankle plantarflexion      Ankle inversion      Ankle eversion       (Blank rows = not tested)   GAIT: Distance walked: 100 feet Assistive device utilized: Environmental Consultant - 2 wheeled Level of assistance: Complete Independence Comments: Taleah would like to be assistive device free                                                                                                                              TREATMENT DATE:  Rt  TKA 02/12/24 Seated BP prior to exercise 158/83, HR 90 BPM Sci fit 2 min at seat 9, 3 min at seat 8, 3 min at seat 7 Leg press double leg 87# x10, 100# x10 Leg press single leg 50# 2x10 Seated LAQ 3# 3x10 Standing hamstring curl 3# 3x10 R&L Side stepping 3# x 4 laps in // bars Backwards stepping 3# x 4 laps in // bars Vaso right knee 10 minutes Medium pressure 34 degrees  02/07/24     R TKA Seated BP prior to exercise 162/98 HR 90 LAQ 2# 3x10 Marching with 2# weight Hamstring stretch in sitting with foot on  step 3x25 sec Sci fit 8 min LE only Neuro Re ed Tandem 25sec 4x Tandem walking onAir ex 6 laps UUDD on Air ex   Vaso right knee 10 minutes Medium pressure 34 degrees   02/04/2024 Seated prior to exercise BP 170/93 HR 101  Therapeutic Exercise: SciFit bike Seat 9 for 8 minutes BLEs only Gastroc stretch step heel depression 30 sec 3 reps Hamstring stretch foot on step trunk flexion 30 sec 3 reps Quad stretch standing knee flexion with towel 30 sec 3 reps  Therapeutic Activities: Weight shift to RLE 3 sec hold 5 reps prior to ambulating.  Pt had decrease in antalgic gait with this activity.  PT demo & verbal cues on step up & step down technique. 6 step with BUE support with carryover of technique with verbal cues 10 reps.   Neuromuscular re-education: Tandem stance 30 sec with RLE in  front 1st rep & in back 2nd rep; 1st set floor eyes open, 2nd set floor eyes closed, 3rd set foam eyes open.   Self-care: PT demo & verbal cues on ice massage. Pt verbalized and return demo understanding.    Manual Therapy: Soft tissue mobilization with suction cup and instrument assisted scar & joint line mobilizations.   Vaso right knee 10 minutes Medium pressure 34 degrees   01/31/2024 Recumbent bike Seat 5 for 5 minutes, full range 6 to 7 MPH Supine quadriceps sets with a heel prop under the right heel 2 sets of 10 for 5 seconds Seated knee flexion active assisted range of motion  with left pushing the right into flexion 2 sets of 10 for 10 seconds TKE between reps of seated knee flexion 2 sets of 10 for 3 seconds  Functional Activities: Double leg Press 15 reps full range with stretch into flexion and extension 87#, slow eccentrics Single leg Press 10 reps full range with stretch into flexion and extension 43#  Neuromuscular re-education: Tandem balance with eyes open; head turning; eyes closed 4 x each 20 sceonds  Vaso right knee 10 minutes Medium pressure 34 degrees   01/29/24 TherAct NuStep seat 5 x 8 min; L5 Seated LAQ 3# on Rt 3x10 with 3 sec hold AA Rt knee flexion 10x10 sec hold with LLE providing overpressure Supine SAQ 3# 3x10; 5 sec hold for ROM Quad sets with heel prop - moved to long sitting using visual feedback for improved quad activation; also discussed heel prop stretch for home AA heel slides 2x10; with ball and strap (beach ball provided for home)  Modalities Vaso x 10 min; mod pressure, 34 deg to Rt knee    PATIENT EDUCATION:  Education details: See above Person educated: Patient Education method: Explanation, Demonstration, Tactile cues, Verbal cues, and Handouts Education comprehension: verbalized understanding, returned demonstration, verbal cues required, tactile cues required, and needs further education   HOME EXERCISE PROGRAM: Access Code: [email or web address] URL: [email or web address] Date: 01/09/2024 Prepared by: Lamar Ivory   Exercises - Supine Quadricep Sets  - 5 x daily - 7 x weekly - 2 sets - 10 reps - 5 second hold - Seated Knee Flexion AAROM  - 5 x daily - 7 x weekly - 2 sets - 5 reps - 10 seconds hold - Seated Long Arc Quad  - 5 x daily - 7 x weekly - 2 sets - 5 reps   ASSESSMENT:   CLINICAL IMPRESSION: Treatment focused on continued R LE strengthening. Initiated dynamic balance exercises for weight shifting and weight bearing in different directions. Pt tolerated well.  Evaluation on 01/09/2024:  Patient  is a 59 y.o. female who was seen today for physical therapy evaluation and treatment for  Z96.651 (ICD-10-CM) - Status post total right knee replacement  .  Camiah did a great job in the office today and had improved active range of motion when she left as compared to objective visits listed above.  I strongly encouraged her to get her exercises at least 5 times a day and to push herself as she is going to have pain whether or not she completes her home exercises and she might as well get the benefit associated with home exercise program participation.  Meriem understood this and based on her efforts today is a good rehabilitation candidate.   OBJECTIVE IMPAIRMENTS: Abnormal gait, decreased activity tolerance, decreased endurance, decreased knowledge of condition, difficulty walking, decreased ROM, decreased strength, increased edema, impaired perceived functional ability, obesity,  and pain.    ACTIVITY LIMITATIONS: bending, standing, squatting, sleeping, stairs, dressing, and locomotion level   PARTICIPATION LIMITATIONS: cleaning, interpersonal relationship, driving, community activity, and occupation   PERSONAL FACTORS: Type 2 diabetes, HLD, HTN, asthma are also affecting patient's functional outcome.    REHAB POTENTIAL: Good   CLINICAL DECISION MAKING: Stable/uncomplicated   EVALUATION COMPLEXITY: Low     GOALS: Goals reviewed with patient? Yes   SHORT TERM GOALS: Target date: 02/20/2024 Masey will be independent with her day 1 home exercise program Baseline: Started 01/09/2024 Goal status: Met 01/10/2024   2.  Improve right knee active range of motion to 0 - 5 - 95 degrees Baseline: 0 - 14 - 70 degrees Goal status: Met 01/21/2024   3.  Solangel will be able to extend her knee against gravity with no quadriceps lag Baseline: Unable at evaluation Goal status: Met 01/21/2024     LONG TERM GOALS: Target date: 04/02/2024   Improve patient-specific functional score to at least  6 Baseline: 2.75 Goal status: Met 01/31/2024   2.  [name] will report right knee pain consistently 3/10 or better on the visual analog scale Baseline: 5-7/10 Goal status: Ongoing 02/04/2024   3.  Improve right knee active range of motion to 0 - 3 - 110 degrees or better Baseline: 0 - 14 - 70 degrees Goal status: Ongoing 02/04/2024   4.  Improve right quadriceps strength as assessed by MMT and functional scores Baseline: Unable to fully extend the knee against gravity and evaluation Goal status: Ongoing 02/04/2024   5.  Rika will be able to walk without an assistive device Baseline: Wheeled walker Goal status: Ongoing 02/04/2024   6.  Tarina will be independent with her long-term maintenance home exercise program at discharge Baseline: Started 01/09/2024 Goal status:   Ongoing 02/04/2024     PLAN:   PT FREQUENCY: 2-3 times a week   PT DURATION: 9 weeks   PLANNED INTERVENTIONS: 97110-Therapeutic exercises, 97530- Therapeutic activity, 97112- Neuromuscular re-education, 97535- Self Care, [id number]- Manual therapy, [phone]- Gait training, [phone]- Electrical stimulation (unattended), [id number]- Vasopneumatic device, Patient/Family education, Balance training, Stair training, Joint mobilization, and Cryotherapy   PLAN FOR NEXT SESSION:    Monitor BP,  check ROM, Post total knee rehabilitation with heavy emphasis remaining on active range of motion, quadriceps strength and edema control.     Jeremiyah Cullens April Ma L Qianna Clagett, PT, DPT 02/12/24 10:18 AM

## 2024-02-13 ENCOUNTER — Encounter: Admitting: Rehabilitative and Restorative Service Providers"

## 2024-02-19 ENCOUNTER — Other Ambulatory Visit: Payer: Self-pay

## 2024-02-19 DIAGNOSIS — E669 Obesity, unspecified: Secondary | ICD-10-CM

## 2024-02-19 MED ORDER — TRULICITY 4.5 MG/0.5ML ~~LOC~~ SOAJ: 4.5000 mg | SUBCUTANEOUS | 2 refills | Status: AC

## 2024-02-24 ENCOUNTER — Encounter: Admitting: Physical Therapy

## 2024-02-25 ENCOUNTER — Ambulatory Visit: Payer: Self-pay

## 2024-02-27 ENCOUNTER — Ambulatory Visit
Admission: RE | Admit: 2024-02-27 | Discharge: 2024-02-27 | Disposition: A | Source: Ambulatory Visit | Attending: Family Medicine | Admitting: Family Medicine

## 2024-02-27 ENCOUNTER — Other Ambulatory Visit: Payer: Self-pay

## 2024-02-27 DIAGNOSIS — F411 Generalized anxiety disorder: Secondary | ICD-10-CM

## 2024-02-27 DIAGNOSIS — I10 Essential (primary) hypertension: Secondary | ICD-10-CM

## 2024-02-27 NOTE — Therapy (Incomplete)
 OUTPATIENT PHYSICAL THERAPY LOWER EXTREMITY TREATMENT    Patient Name: Sandra [name] MRN: [id number] DOB:[birth year], 59 y.o., female Today's Date: 01/09/2024   END OF SESSION:          Past Medical History:  Diagnosis Date   Acute conjunctivitis, right eye 01/31/2022   Acute pain of left shoulder 06/07/2017   Adenomyosis 06/03/2012   Arthritis     Asthma      as a child   Asthma 11/07/2005    Reported Diagnosis in 2012, no formal spirometry    Cardiomyopathy (HCC)      likely nonischemic DCM per Dr. 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   Perimenopausal menorrhagia 02/20/2012   Perimenopausal menorrhagia 02/20/2012   Psoriasis 04/29/2013   Right foot strain 05/15/2021   Right low back pain 03/02/2014   Routine adult health maintenance 09/27/2010   Swelling of abdominal wall 03/02/2014   Swelling of abdominal wall 03/02/2014   Symptoms of upper respiratory infection (URI) 04/01/2018   Tinnitus 06/01/2020   Viral illness 10/08/2013   Viral URI with cough 01/13/2013             Past Surgical History:  Procedure Laterality Date   CHOLECYSTECTOMY   2009   DILATATION & CURETTAGE/HYSTEROSCOPY WITH MYOSURE N/A 12/25/2017    Procedure: DILATATION & CURETTAGE/HYSTEROSCOPY   POLYPECTOMY WITH MYOSURE;  Surgeon: Linnell Devere BRAVO, MD;  Location: [facility];  Service: Gynecology;  Laterality: N/A;   TOTAL KNEE ARTHROPLASTY Right 12/23/2023    Procedure: RIGHT TOTAL KNEE ARTHROPLASTY;  Surgeon: [name], MD;  Location: [facility];  Service: Orthopedics;  Laterality: Right;   TUBAL LIGATION                Patient Active Problem List    Diagnosis Date Noted   Status post total right knee replacement 12/23/2023   Primary osteoarthritis of right knee 12/20/2023   Exertional dyspnea 03/27/2023   Macrocytic anemia 08/08/2022   Trigger index finger of left hand 08/08/2022   Pigmented skin lesion of uncertain behavior of head 02/01/2022   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   GERD (gastroesophageal reflux disease) 10/15/2019   Generalized anxiety disorder 09/21/2019   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   Influenza 02/23/2016   Psoriasis 04/29/2013   Morbid obesity (HCC) 05/16/2012   Chronic HFrEF (heart failure with reduced ejection fraction) (HCC) 02/22/2012   LBBB (left bundle branch block) 01/24/2012   Routine adult health maintenance 09/27/2010   Type 2 diabetes mellitus with other specified complication (HCC) 11/17/2009    Hyperlipidemia 08/20/2007   Essential hypertension 07/08/2006   Asthma 11/07/2005      PCP: Armando Rossetti, MD   REFERRING PROVIDER: Ronal LITTIE Grave, PA-C   REFERRING DIAG:  [phone] (ICD-10-CM) - Status post total right knee replacement      THERAPY DIAG:  No diagnosis found.    Rationale for Evaluation and Treatment: Rehabilitation   ONSET DATE: 12/23/2023   SUBJECTIVE:    SUBJECTIVE STATEMENT: ***Pt reports feeling overall better than last visit.   PERTINENT HISTORY: Type 2 diabetes, HLD, HTN, asthma    PAIN:  ***Are you having pain? Yes: NPRS scale: today 0/10 but took meds Pain location: Rt knee  Pain description: Ache, stiff, sore, occasional sharp Aggravating factors: Prolonged postures, better with sleeping sleeping and end range motion Relieving factors: Tylenol , ice, muscle relaxer   PRECAUTIONS: None   RED FLAGS: None      WEIGHT BEARING RESTRICTIONS: No   FALLS:  Has patient fallen in last 6 months? No   LIVING ENVIRONMENT: Lives with: lives with their family and lives with their spouse Lives in: House/apartment Stairs: OK with a rail, but needs work Has following equipment at home: Environmental Consultant - 2 wheeled   OCCUPATION: Designer, jewellery   PLOF: Independent   PATIENT GOALS: Normal motion, walk without an assistive device, return to work without restrictions, sleep normally   NEXT MD VISIT: 02/04/2023   OBJECTIVE:  Note: Objective measures were completed at Evaluation unless otherwise noted.   DIAGNOSTIC FINDINGS: Right knee arthroplasty without immediate postoperative complication.   PATIENT SURVEYS:  PSFS: THE PATIENT SPECIFIC FUNCTIONAL SCALE   Place score of 0-10 (0 = unable to perform activity and 10 = able to perform activity at the same level as before injury or problem)   Activity Date:  01/09/2024 01/31/2024    Bend the right knee 2  5    2.  Walk 2  5    3.  Stand 4  7    4.  Sleep 3  7    Total Score 2.75  6        Total  Score = Sum of activity scores/number of activities   Minimally Detectable Change: 3 points (for single activity); 2 points (for average score)   [facility] (nd). The Patient Specific Functional Scale . Retrieved from [email or web address]    COGNITION: Overall cognitive status: Within functional limits for tasks assessed                         SENSATION: Fadia had no complaints of peripheral pain or paresthesias   EDEMA:  Noted and not objectively assessed   LOWER EXTREMITY ROM:   Active ROM Left/Right 01/09/24  Right  01/10/24 Right 01/21/2024 Right 01/24/2024 Right 01/29/24 Right 01/31/2024  Knee flexion 134/70  84 96 100 AA: 98 P: 102 103  Knee extension 0/-14  -7 -4 -4 P: -6 (supine) -3   (Blank rows = not tested)   LOWER EXTREMITY STRENGTH:   MMT  Left/Right 01/09/24 Left/Right 01/31/2024   Hip flexion      Hip extension      Hip abduction      Hip adduction      Hip internal rotation      Hip external rotation      Knee flexion      Knee extension 4+/2+  4+/2+  Ankle dorsiflexion      Ankle plantarflexion      Ankle inversion      Ankle eversion       (Blank rows = not tested)   GAIT: Distance walked: 100 feet Assistive device utilized: Environmental Consultant - 2 wheeled Level of assistance: Complete Independence Comments: Rileyann would like to be assistive device free                                                                                                                              TREATMENT DATE:  Rt TKA 02/28/24***     02/12/24 Seated BP prior to exercise 158/83, HR 90 BPM Sci fit 2 min at seat 9, 3 min at seat 8, 3 min at seat 7 Leg press double leg 87# x10, 100# x10 Leg press single leg 50# 2x10 Seated LAQ 3# 3x10 Standing hamstring curl 3# 3x10 R&L Side stepping 3# x 4 laps in // bars Backwards stepping 3# x 4 laps in // bars Vaso right knee 10 minutes Medium pressure 34 degrees  02/07/24      R TKA Seated BP prior to exercise 162/98 HR 90 LAQ 2# 3x10 Marching with 2# weight Hamstring stretch in sitting with foot on  step 3x25 sec Sci fit 8 min LE only Neuro Re ed Tandem 25sec 4x Tandem walking onAir ex 6 laps UUDD on Air ex   Vaso right knee 10 minutes Medium pressure 34 degrees   02/04/2024 Seated prior to exercise BP 170/93 HR 101  Therapeutic Exercise: SciFit bike Seat 9 for 8 minutes BLEs only Gastroc stretch step heel depression 30 sec 3 reps Hamstring stretch foot on step trunk flexion 30 sec 3 reps Quad stretch standing knee flexion with towel 30 sec 3 reps  Therapeutic Activities: Weight shift to RLE 3 sec hold 5 reps prior to ambulating.  Pt had decrease in antalgic gait with this activity.  PT demo & verbal cues on step up & step down technique. 6 step with BUE support with carryover of technique with verbal cues 10 reps.   Neuromuscular re-education: Tandem stance 30 sec with RLE in front 1st rep & in back 2nd rep; 1st set floor eyes open, 2nd set floor eyes closed, 3rd set foam eyes open.   Self-care: PT demo & verbal cues on ice massage. Pt verbalized and return demo understanding.    Manual Therapy: Soft tissue mobilization with suction cup and instrument assisted scar & joint line mobilizations.   Vaso right knee 10 minutes Medium pressure 34 degrees   01/31/2024 Recumbent bike Seat 5  for 5 minutes, full range 6 to 7 MPH Supine quadriceps sets with a heel prop under the right heel 2 sets of 10 for 5 seconds Seated knee flexion active assisted range of motion with left pushing the right into flexion 2 sets of 10 for 10 seconds TKE between reps of seated knee flexion 2 sets of 10 for 3 seconds  Functional Activities: Double leg Press 15 reps full range with stretch into flexion and extension 87#, slow eccentrics Single leg Press 10 reps full range with stretch into flexion and extension 43#  Neuromuscular re-education: Tandem balance with eyes  open; head turning; eyes closed 4 x each 20 sceonds  Vaso right knee 10 minutes Medium pressure 34 degrees   01/29/24 TherAct NuStep seat 5 x 8 min; L5 Seated LAQ 3# on Rt 3x10 with 3 sec hold AA Rt knee flexion 10x10 sec hold with LLE providing overpressure Supine SAQ 3# 3x10; 5 sec hold for ROM Quad sets with heel prop - moved to long sitting using visual feedback for improved quad activation; also discussed heel prop stretch for home AA heel slides 2x10; with ball and strap (beach ball provided for home)  Modalities Vaso x 10 min; mod pressure, 34 deg to Rt knee    PATIENT EDUCATION:  Education details: See above Person educated: Patient Education method: Explanation, Demonstration, Tactile cues, Verbal cues, and Handouts Education comprehension: verbalized understanding, returned demonstration, verbal cues required, tactile cues required, and needs further education   HOME EXERCISE PROGRAM: Access Code: [email or web address] URL: [email or web address] Date: 01/09/2024 Prepared by: Lamar Ivory   Exercises - Supine Quadricep Sets  - 5 x daily - 7 x weekly - 2 sets - 10 reps - 5 second hold - Seated Knee Flexion AAROM  - 5 x daily - 7 x weekly - 2 sets - 5 reps - 10 seconds hold - Seated Long Arc Quad  - 5 x daily - 7 x weekly - 2 sets - 5 reps   ASSESSMENT:   CLINICAL IMPRESSION: ***Treatment focused on continued R LE strengthening. Initiated dynamic balance exercises for weight shifting and weight bearing in different directions. Pt tolerated well.  Evaluation on 01/09/2024:  Patient is a 59 y.o. female who was seen today for physical therapy evaluation and treatment for  Z96.651 (ICD-10-CM) - Status post total right knee replacement  .  Robbye did a great job in the office today and had improved active range of motion when she left as compared to objective visits listed above.  I strongly encouraged her to get her exercises at least 5 times a day and to push herself  as she is going to have pain whether or not she completes her home exercises and she might as well get the benefit associated with home exercise program participation.  Tericka understood this and based on her efforts today is a good rehabilitation candidate.   OBJECTIVE IMPAIRMENTS: Abnormal gait, decreased activity tolerance, decreased endurance, decreased knowledge of condition, difficulty walking, decreased ROM, decreased strength, increased edema, impaired perceived functional ability, obesity, and pain.    ACTIVITY LIMITATIONS: bending, standing, squatting, sleeping, stairs, dressing, and locomotion level   PARTICIPATION LIMITATIONS: cleaning, interpersonal relationship, driving, community activity, and occupation   PERSONAL FACTORS: Type 2 diabetes, HLD, HTN, asthma are also affecting patient's functional outcome.    REHAB POTENTIAL: Good   CLINICAL DECISION MAKING: Stable/uncomplicated   EVALUATION COMPLEXITY: Low     GOALS: Goals reviewed with patient? Yes   SHORT TERM GOALS: Target  date: 02/20/2024*** Jasalyn will be independent with her day 1 home exercise program Baseline: Started 01/09/2024 Goal status: Met 01/10/2024   2.  Improve right knee active range of motion to 0 - 5 - 95 degrees Baseline: 0 - 14 - 70 degrees Goal status: Met 01/21/2024   3.  Amandine will be able to extend her knee against gravity with no quadriceps lag Baseline: Unable at evaluation Goal status: Met 01/21/2024     LONG TERM GOALS: Target date: 04/02/2024   Improve patient-specific functional score to at least 6 Baseline: 2.75 Goal status: Met 01/31/2024   2.  Cyndi will report right knee pain consistently 3/10 or better on the visual analog scale Baseline: 5-7/10 Goal status: Ongoing 02/04/2024   3.  Improve right knee active range of motion to 0 - 3 - 110 degrees or better Baseline: 0 - 14 - 70 degrees Goal status: Ongoing 02/04/2024   4.  Improve right quadriceps strength as assessed  by MMT and functional scores Baseline: Unable to fully extend the knee against gravity and evaluation Goal status: Ongoing 02/04/2024   5.  Evetta will be able to walk without an assistive device Baseline: Wheeled walker Goal status: Ongoing 02/04/2024   6.  Ailey will be independent with her long-term maintenance home exercise program at discharge Baseline: Started 01/09/2024 Goal status:   Ongoing 02/04/2024     PLAN:   PT FREQUENCY: 2-3 times a week   PT DURATION: 9 weeks   PLANNED INTERVENTIONS: 97110-Therapeutic exercises, 97530- Therapeutic activity, 97112- Neuromuscular re-education, 97535- Self Care, 02859- Manual therapy, [phone]- Gait training, [phone]- Electrical stimulation (unattended), [id number]- Vasopneumatic device, Patient/Family education, Balance training, Stair training, Joint mobilization, and Cryotherapy   PLAN FOR NEXT SESSION:   *** Monitor BP,  check ROM, Post total knee rehabilitation with heavy emphasis remaining on active range of motion, quadriceps strength and edema control.     Burnard CHRISTELLA Meth, PT, 02/27/24 8:14 AM

## 2024-02-28 ENCOUNTER — Encounter: Payer: Self-pay | Admitting: Rehabilitative and Restorative Service Providers"

## 2024-02-28 ENCOUNTER — Ambulatory Visit: Admitting: Rehabilitative and Restorative Service Providers"

## 2024-02-28 DIAGNOSIS — M6281 Muscle weakness (generalized): Secondary | ICD-10-CM

## 2024-02-28 DIAGNOSIS — R262 Difficulty in walking, not elsewhere classified: Secondary | ICD-10-CM

## 2024-02-28 DIAGNOSIS — M25561 Pain in right knee: Secondary | ICD-10-CM

## 2024-02-28 DIAGNOSIS — M25661 Stiffness of right knee, not elsewhere classified: Secondary | ICD-10-CM

## 2024-02-28 DIAGNOSIS — R600 Localized edema: Secondary | ICD-10-CM

## 2024-02-28 NOTE — Therapy (Signed)
 OUTPATIENT PHYSICAL THERAPY LOWER EXTREMITY TREATMENT/PROGRESS NOTE  Progress Note Reporting Period 01/09/2024 to 02/28/2024  See note below for Objective Data and Assessment of Progress/Goals.      Patient Name: Sandra [name] MRN: [id number] DOB:02-Nov-1965, 59 y.o., female Today's Date: 01/09/2024   END OF SESSION:  PT End of Session - 02/28/24 0839     Visit Number 10    Number of Visits 24    Date for Recertification  04/02/24    Authorization Type [employer] [facility]; 20 visit limit, $35 copay    PT Start Time [phone]    PT Stop Time 0954    PT Time Calculation (min) 75 min    Activity Tolerance Patient tolerated treatment well;No increased pain;Patient limited by pain    Behavior During Therapy [facility] for tasks assessed/performed               Past Medical History:  Diagnosis Date   Acute conjunctivitis, right eye 01/31/2022   Acute pain of left shoulder 06/07/2017   Adenomyosis 06/03/2012   Arthritis     Asthma      as a child   Asthma 11/07/2005    Reported Diagnosis in 2012, no formal spirometry    Cardiomyopathy (HCC)      likely nonischemic DCM per Dr. 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   Perimenopausal menorrhagia 02/20/2012   Perimenopausal menorrhagia 02/20/2012   Psoriasis 04/29/2013   Right foot strain 05/15/2021   Right low back pain 03/02/2014   Routine adult health maintenance 09/27/2010   Swelling of abdominal wall 03/02/2014   Swelling of abdominal wall 03/02/2014   Symptoms of upper respiratory infection (URI) 04/01/2018   Tinnitus 06/01/2020   Viral illness 10/08/2013   Viral URI with cough 01/13/2013             Past Surgical History:  Procedure Laterality Date   CHOLECYSTECTOMY   2009   DILATATION & CURETTAGE/HYSTEROSCOPY WITH MYOSURE N/A 12/25/2017    Procedure: DILATATION & CURETTAGE/HYSTEROSCOPY   POLYPECTOMY WITH MYOSURE;  Surgeon: Linnell Devere BRAVO, MD;  Location: [facility]  ORS;  Service: Gynecology;  Laterality: N/A;   TOTAL KNEE ARTHROPLASTY Right 12/23/2023    Procedure: RIGHT TOTAL KNEE ARTHROPLASTY;  Surgeon: [name], MD;  Location: [facility] OR;  Service: Orthopedics;  Laterality: Right;   TUBAL LIGATION                Patient Active Problem List    Diagnosis Date Noted   Status post total right knee replacement 12/23/2023   Primary osteoarthritis of right knee 12/20/2023   Exertional dyspnea 03/27/2023   Macrocytic anemia 08/08/2022   Trigger index finger of left hand 08/08/2022   Pigmented skin  lesion of uncertain behavior of head 02/01/2022   Post-viral cough syndrome 10/29/2019   GERD (gastroesophageal reflux disease) 10/15/2019   Generalized anxiety disorder 09/21/2019   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   Influenza 02/23/2016   Psoriasis 04/29/2013   Morbid obesity (HCC) 05/16/2012   Chronic HFrEF (heart failure with reduced ejection fraction) (HCC) 02/22/2012   LBBB (left bundle branch block) 01/24/2012   Routine adult health maintenance 09/27/2010   Type 2 diabetes mellitus with other specified complication (HCC) 11/17/2009   Hyperlipidemia 08/20/2007   Essential hypertension 07/08/2006   Asthma 11/07/2005      PCP: Armando Rossetti, MD   REFERRING PROVIDER: Ronal LITTIE Grave, PA-C   REFERRING DIAG:  [phone] (ICD-10-CM) - Status post total right knee replacement      THERAPY DIAG:  Difficulty in walking, not elsewhere classified  Muscle weakness (generalized)  Stiffness of right knee, not elsewhere classified  Localized edema  Acute pain of right knee    Rationale for Evaluation and Treatment: Rehabilitation   ONSET DATE: 12/23/2023   SUBJECTIVE:    SUBJECTIVE STATEMENT: Sandra [name] reports inconsistent HEP compliance while snowed in over the past ~ 2 weeks.  Sleep is better overall.   PERTINENT HISTORY: Type 2 diabetes, HLD, HTN, asthma    PAIN:  Are you having pain? Yes: NPRS scale: 1-3/10 this week, no pain meds Pain location: Rt knee  Pain description: Ache, stiff, sore Aggravating factors: Prolonged postures, better with sleeping sleeping and end range motion, occasionally gives out Relieving factors: Tylenol , ice, muscle relaxer   PRECAUTIONS: None   RED FLAGS: None      WEIGHT BEARING RESTRICTIONS: No   FALLS:  Has patient fallen in last 6 months? No   LIVING ENVIRONMENT: Lives with: lives with their family and lives with their spouse Lives in: House/apartment Stairs: OK with a rail, but needs work Has following  equipment at home: Environmental Consultant - 2 wheeled   OCCUPATION: Designer, jewellery   PLOF: Independent   PATIENT GOALS: Normal motion, walk without an assistive device, return to work without restrictions, sleep normally   NEXT MD VISIT: 03/17/2024   OBJECTIVE:  Note: Objective measures were completed at Evaluation unless otherwise noted.   DIAGNOSTIC FINDINGS: Right knee arthroplasty without immediate postoperative complication.   PATIENT SURVEYS:  PSFS: THE PATIENT SPECIFIC FUNCTIONAL SCALE   Place score of 0-10 (0 = unable to perform activity and 10 = able to perform activity at the same level as before injury or problem)   Activity Date:  01/09/2024 01/31/2024 02/28/2024   Bend the right knee 2  5  6   2.  Walk 2  5  7   3.  Stand 4  7  5   4.  Sleep 3  7  7   Total Score 2.75  6  6      Total Score = Sum  of activity scores/number of activities   Minimally Detectable Change: 3 points (for single activity); 2 points (for average score)   [name] (nd). The Patient Specific Functional Scale . Retrieved from [email or web address]    COGNITION: Overall cognitive status: Within functional limits for tasks assessed                         SENSATION: Krisha had no complaints of peripheral pain or paresthesias   EDEMA:  Noted and not objectively assessed   LOWER EXTREMITY ROM:   Active ROM Left/Right 01/09/24  Right  01/10/24 Right 01/21/2024 Right 01/24/2024 Right 01/29/24 Right 01/31/2024 Right 02/28/2024  Knee flexion 134/70  84 96 100 AA: 98 P: 102 103 98  Knee extension 0/-14  -7 -4 -4 P: -6 (supine) -3 -3 (lacks 3 degrees)   (Blank rows = not tested)   LOWER EXTREMITY STRENGTH:   MMT Left/Right 01/09/24 Left/Right 01/31/2024  Left/Right 02/28/2024 in pounds with hand-held dynamometer  Hip flexion       Hip extension       Hip abduction       Hip adduction       Hip internal rotation       Hip external rotation        Knee flexion       Knee extension 4+/2+  4+/2+ 63.3/28.1  Ankle dorsiflexion       Ankle plantarflexion       Ankle inversion       Ankle eversion        (Blank rows = not tested)   GAIT: Distance walked: 100 feet Assistive device utilized: Environmental Consultant - 2 wheeled Level of assistance: Complete Independence Comments: Sandra [name] would like to be assistive device free                                                                                                                              TREATMENT DATE:  Rt TKA 02/28/2024 Recumbent bike Seat 4 active-assisted to begin then work into full range 5 minutes Seated active-assisted flexion (left pushes right into flexion) 10 x 10 seconds Supine quad sets with right heel prop 2 sets of 10 for 5 seconds  Functional Activities (stairs, in and out of a chair): Double Leg Press 93# 15 reps and slow eccentrics Single Leg Press 50# 10 reps and slow eccentrics Step-up and over 6 inch step with cues for a high step (hip flexion) 10 reps with slow eccentrics Step-down off 2 inch step 2 sets of 10 with emphasis on slow eccentrics, left hand on the rail as needed  Neuromuscular re-education: Tandem balance: Eyes open; head turning; eyes closed 4 x 20 seconds each Single leg balance 10 x 10 seconds (alternating) discussed  97535: Reviewed updates and changes to HEP; reminded Sandra [name] that her window of opportunity to get her active range of motion is rapidly closing and we have about  another month where we can make significant active range of motion gains, therefore home exercise program participation is extremely important over the next 3 to 4 weeks   02/12/24 Seated BP prior to exercise 158/83, HR 90 BPM Sci fit 2 min at seat 9, 3 min at seat 8, 3 min at seat 7 Leg press double leg 87# x10, 100# x10 Leg press single leg 50# 2x10 Seated LAQ 3# 3x10 Standing hamstring curl 3# 3x10 R&L Side stepping 3# x 4 laps in // bars Backwards stepping 3# x 4 laps  in // bars Vaso right knee 10 minutes Medium pressure 34 degrees  02/07/24     R TKA Seated BP prior to exercise 162/98 HR 90 LAQ 2# 3x10 Marching with 2# weight Hamstring stretch in sitting with foot on  step 3x25 sec Sci fit 8 min LE only Neuro Re ed Tandem 25sec 4x Tandem walking onAir ex 6 laps UUDD on Air ex   Vaso right knee 10 minutes Medium pressure 34 degrees   02/04/2024 Seated prior to exercise BP 170/93 HR 101  Therapeutic Exercise: SciFit bike Seat 9 for 8 minutes BLEs only Gastroc stretch step heel depression 30 sec 3 reps Hamstring stretch foot on step trunk flexion 30 sec 3 reps Quad stretch standing knee flexion with towel 30 sec 3 reps  Therapeutic Activities: Weight shift to RLE 3 sec hold 5 reps prior to ambulating.  Pt had decrease in antalgic gait with this activity.  PT demo & verbal cues on step up & step down technique. 6 step with BUE support with carryover of technique with verbal cues 10 reps.   Neuromuscular re-education: Tandem stance 30 sec with RLE in front 1st rep & in back 2nd rep; 1st set floor eyes open, 2nd set floor eyes closed, 3rd set foam eyes open.   Self-care: PT demo & verbal cues on ice massage. Pt verbalized and return demo understanding.    Manual Therapy: Soft tissue mobilization with suction cup and instrument assisted scar & joint line mobilizations.   Vaso right knee 10 minutes Medium pressure 34 degrees    PATIENT EDUCATION:  Education details: See above Person educated: Patient Education method: Explanation, Demonstration, Tactile cues, Verbal cues, and Handouts Education comprehension: verbalized understanding, returned demonstration, verbal cues required, tactile cues required, and needs further education   HOME EXERCISE PROGRAM: Access Code: [email or web address] URL: [email or web address] Date: 02/28/2024 Prepared by: Lamar Ivory  Exercises - Supine Quadricep Sets  - 5 x daily - 7 x weekly - 2 sets - 10  reps - 5 second hold - Seated Knee Flexion AAROM  - 5 x daily - 7 x weekly - 1-2 sets - 10 reps - 10 seconds hold - Lateral Step Down  - 1 x daily - 3-4 x weekly - 2-3 sets - 10 reps - Tandem Stance  - 1 x daily - 7 x weekly - 1 sets - 10 reps - 20 second hold   ASSESSMENT:   CLINICAL IMPRESSION: Active range of motion was 0 - 3 - 98 degrees today.  This is a bit stiffer than the last time she was objectively reassessed, although Sandra [name] has not attended supervised physical therapy in over 2 weeks due to the winter weather, snow and ice.  Quadriceps strength is approximately 45% of the uninvolved left side.  Long-term goals of 0 - 3 - 110 degrees and at least 60% quadriceps strength as compared to the uninvolved side are still within reach during her  current plan of care.  I recommend Sandra [name] continue her supervised rehabilitation and increase home exercise program participation to meet long-term goals within her current plan of care.  Evaluation on 01/09/2024:  Patient is a 59 y.o. female who was seen today for physical therapy evaluation and treatment for  Z96.651 (ICD-10-CM) - Status post total right knee replacement  .  Sandra [name] did a great job in the office today and had improved active range of motion when she left as compared to objective visits listed above.  I strongly encouraged her to get her exercises at least 5 times a day and to push herself as she is going to have pain whether or not she completes her home exercises and she might as well get the benefit associated with home exercise program participation.  Sandra [name] understood this and based on her efforts today is a good rehabilitation candidate.   OBJECTIVE IMPAIRMENTS: Abnormal gait, decreased activity tolerance, decreased endurance, decreased knowledge of condition, difficulty walking, decreased ROM, decreased strength, increased edema, impaired perceived functional ability, obesity, and pain.    ACTIVITY LIMITATIONS: bending,  standing, squatting, sleeping, stairs, dressing, and locomotion level   PARTICIPATION LIMITATIONS: cleaning, interpersonal relationship, driving, community activity, and occupation   PERSONAL FACTORS: Type 2 diabetes, HLD, HTN, asthma are also affecting patient's functional outcome.    REHAB POTENTIAL: Good   CLINICAL DECISION MAKING: Stable/uncomplicated   EVALUATION COMPLEXITY: Low     GOALS: Goals reviewed with patient? Yes   SHORT TERM GOALS: Target date: 02/20/2024 Sandra [name] will be independent with her day 1 home exercise program Baseline: Started 01/09/2024 Goal status: Met 01/10/2024   2.  Improve right knee active range of motion to 0 - 5 - 95 degrees Baseline: 0 - 14 - 70 degrees Goal status: Met 01/21/2024   3.  Sandra [name] will be able to extend her knee against gravity with no quadriceps lag Baseline: Unable at evaluation Goal status: Met 01/21/2024     LONG TERM GOALS: Target date: 04/02/2024   Improve patient-specific functional score to at least 6 Baseline: 2.75 Goal status: Met 01/31/2024   2.  Sandra [name] will report right knee pain consistently 3/10 or better on the visual analog scale Baseline: 5-7/10 Goal status: Ongoing 02/28/2024   3.  Improve right knee active range of motion to 0 - 3 - 110 degrees or better Baseline: 0 - 14 - 70 degrees Goal status: Partially met 02/28/2024   4.  Improve right quadriceps strength as assessed by MMT and functional scores Baseline: Unable to fully extend the knee against gravity and evaluation Goal status: Ongoing 02/28/2024   5.  Sandra [name] will be able to walk without an assistive device Baseline: Wheeled walker Goal status: Ongoing 02/28/2024   6.  Sandra [name] will be independent with her long-term maintenance home exercise program at discharge Baseline: Started 01/09/2024 Goal status:   Ongoing 02/28/2024     PLAN:   PT FREQUENCY: 2 times a week   PT DURATION: Up to and including 04/02/2024   PLANNED INTERVENTIONS:  97110-Therapeutic exercises, 97530- Therapeutic activity, 97112- Neuromuscular re-education, 97535- Self Care, 02859- Manual therapy, [phone]- Gait training, [phone]- Electrical stimulation (unattended), 97016- Vasopneumatic device, Patient/Family education, Balance training, Stair training, Joint mobilization, and Cryotherapy   PLAN FOR NEXT SESSION: Post total knee rehabilitation with heavy emphasis remaining on active range of motion, quadriceps strength (to address instability), stairs and more advanced functional activities, balance for transition off of the cane and edema control.     Myer ORN  Farrell, PT, MPT 02/28/24 10:01 AM

## 2024-03-03 ENCOUNTER — Encounter: Admitting: Physical Therapy

## 2024-03-04 ENCOUNTER — Ambulatory Visit: Admitting: Student

## 2024-03-06 ENCOUNTER — Encounter: Admitting: Rehabilitative and Restorative Service Providers"

## 2024-03-09 ENCOUNTER — Encounter: Admitting: Rehabilitative and Restorative Service Providers"

## 2024-03-11 ENCOUNTER — Encounter: Admitting: Rehabilitative and Restorative Service Providers"

## 2024-03-16 ENCOUNTER — Encounter: Admitting: Physical Therapy

## 2024-03-17 ENCOUNTER — Encounter: Admitting: Physician Assistant

## 2024-03-18 ENCOUNTER — Encounter: Admitting: Rehabilitative and Restorative Service Providers"
